# Patient Record
Sex: Female | Born: 1950 | Race: White | Hispanic: No | Smoking: Former smoker
Health system: Southern US, Community
[De-identification: ages and names within clinical notes are randomized; demographics above are authoritative.]

## PROBLEM LIST (undated history)

## (undated) DIAGNOSIS — F419 Anxiety disorder, unspecified: Secondary | ICD-10-CM

## (undated) DIAGNOSIS — I509 Heart failure, unspecified: Secondary | ICD-10-CM

## (undated) DIAGNOSIS — F32A Depression, unspecified: Secondary | ICD-10-CM

## (undated) DIAGNOSIS — J449 Chronic obstructive pulmonary disease, unspecified: Secondary | ICD-10-CM

## (undated) DIAGNOSIS — F329 Major depressive disorder, single episode, unspecified: Secondary | ICD-10-CM

## (undated) DIAGNOSIS — J45909 Unspecified asthma, uncomplicated: Secondary | ICD-10-CM

## (undated) HISTORY — DX: Depression, unspecified: F32.A

## (undated) HISTORY — DX: Major depressive disorder, single episode, unspecified: F32.9

## (undated) HISTORY — DX: Anxiety disorder, unspecified: F41.9

## (undated) HISTORY — PX: ABDOMINAL HYSTERECTOMY: SHX81

## (undated) HISTORY — DX: Chronic obstructive pulmonary disease, unspecified: J44.9

---

## 2000-10-02 ENCOUNTER — Encounter: Payer: Self-pay | Admitting: Emergency Medicine

## 2000-10-02 ENCOUNTER — Emergency Department (HOSPITAL_COMMUNITY): Admission: EM | Admit: 2000-10-02 | Discharge: 2000-10-02 | Payer: Self-pay

## 2001-04-25 ENCOUNTER — Emergency Department (HOSPITAL_COMMUNITY): Admission: EM | Admit: 2001-04-25 | Discharge: 2001-04-25 | Payer: Self-pay | Admitting: Emergency Medicine

## 2001-08-19 ENCOUNTER — Emergency Department (HOSPITAL_COMMUNITY): Admission: EM | Admit: 2001-08-19 | Discharge: 2001-08-19 | Payer: Self-pay

## 2001-12-03 ENCOUNTER — Other Ambulatory Visit: Admission: RE | Admit: 2001-12-03 | Discharge: 2001-12-03 | Payer: Self-pay | Admitting: *Deleted

## 2001-12-16 ENCOUNTER — Encounter: Admission: RE | Admit: 2001-12-16 | Discharge: 2001-12-16 | Payer: Self-pay | Admitting: *Deleted

## 2001-12-16 ENCOUNTER — Encounter: Payer: Self-pay | Admitting: *Deleted

## 2005-05-25 ENCOUNTER — Emergency Department (HOSPITAL_COMMUNITY): Admission: EM | Admit: 2005-05-25 | Discharge: 2005-05-25 | Payer: Self-pay | Admitting: Emergency Medicine

## 2006-03-13 ENCOUNTER — Emergency Department (HOSPITAL_COMMUNITY): Admission: EM | Admit: 2006-03-13 | Discharge: 2006-03-13 | Payer: Self-pay | Admitting: Emergency Medicine

## 2007-02-05 ENCOUNTER — Encounter: Admission: RE | Admit: 2007-02-05 | Discharge: 2007-02-05 | Payer: Self-pay | Admitting: Family Medicine

## 2010-05-02 ENCOUNTER — Other Ambulatory Visit: Payer: Self-pay | Admitting: Gastroenterology

## 2010-05-02 DIAGNOSIS — R110 Nausea: Secondary | ICD-10-CM

## 2010-05-27 ENCOUNTER — Ambulatory Visit
Admission: RE | Admit: 2010-05-27 | Discharge: 2010-05-27 | Disposition: A | Discharge status: Home / Self Care | Payer: BC Managed Care – PPO | Source: Ambulatory Visit | Attending: Gastroenterology | Admitting: Gastroenterology

## 2010-05-27 DIAGNOSIS — R110 Nausea: Secondary | ICD-10-CM

## 2011-06-23 ENCOUNTER — Encounter (HOSPITAL_COMMUNITY): Payer: Self-pay | Admitting: *Deleted

## 2011-06-23 ENCOUNTER — Emergency Department (HOSPITAL_COMMUNITY)
Admission: EM | Admit: 2011-06-23 | Discharge: 2011-06-23 | Disposition: A | Discharge status: Home / Self Care | Payer: Worker's Compensation | Attending: Emergency Medicine | Admitting: Emergency Medicine

## 2011-06-23 DIAGNOSIS — S61209A Unspecified open wound of unspecified finger without damage to nail, initial encounter: Secondary | ICD-10-CM | POA: Insufficient documentation

## 2011-06-23 DIAGNOSIS — Z79899 Other long term (current) drug therapy: Secondary | ICD-10-CM | POA: Insufficient documentation

## 2011-06-23 DIAGNOSIS — Z23 Encounter for immunization: Secondary | ICD-10-CM | POA: Insufficient documentation

## 2011-06-23 DIAGNOSIS — W268XXA Contact with other sharp object(s), not elsewhere classified, initial encounter: Secondary | ICD-10-CM | POA: Insufficient documentation

## 2011-06-23 DIAGNOSIS — Y9229 Other specified public building as the place of occurrence of the external cause: Secondary | ICD-10-CM | POA: Insufficient documentation

## 2011-06-23 DIAGNOSIS — S61019A Laceration without foreign body of unspecified thumb without damage to nail, initial encounter: Secondary | ICD-10-CM

## 2011-06-23 DIAGNOSIS — F17200 Nicotine dependence, unspecified, uncomplicated: Secondary | ICD-10-CM | POA: Insufficient documentation

## 2011-06-23 DIAGNOSIS — Z882 Allergy status to sulfonamides status: Secondary | ICD-10-CM | POA: Insufficient documentation

## 2011-06-23 NOTE — ED Provider Notes (Signed)
History     CSN: [id number]  Arrival date & time 06/23/11  4098   First MD Initiated Contact with Patient 06/23/11 [phone]      Chief Complaint  Patient presents with  . Laceration    (Consider location/radiation/quality/duration/timing/severity/associated sxs/prior treatment) Patient is a 61 y.o. female presenting with skin laceration. The history is provided by the patient.  Laceration  The incident occurred 1 to 2 hours ago.   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 She states it was a clean blade. Tetanus is up-to-date. No other injury. No numbness weakness. She states it bled a lot.  History reviewed. No pertinent past medical history.  History reviewed. No pertinent past surgical history.  No family history on file.  History  Substance Use Topics  . Smoking status: Current Everyday Smoker -- 0.7 packs/day  . Smokeless tobacco: Not on file  . Alcohol Use: No    OB History    Grav Para Term Preterm Abortions TAB SAB Ect Mult Living                  Review of Systems  Skin: Positive for wound.  Neurological: Negative for weakness and numbness.    Allergies  Sulfa antibiotics  Home Medications   Current Outpatient Rx  Name Route Sig Dispense Refill  . AMPHETAMINE-DEXTROAMPHETAMINE 20 MG PO TABS Oral Take 20 mg by mouth 2 (two) times daily.    . QUETIAPINE FUMARATE 25 MG PO TABS Oral Take 50 mg by mouth at bedtime.    Marland Kitchen RISPERIDONE 4 MG PO TABS Oral Take 4 mg by mouth at bedtime.    . VENLAFAXINE HCL ER 150 MG PO CP24 Oral Take 150 mg by mouth daily.      BP 129/92  Pulse 93  Temp 98.4 F (36.9 C)  Resp 20  SpO2 97%  Physical Exam  Musculoskeletal:       1 cm laceration to medial aspect of distal phalanx of right thumb no active bleeding sensation intact distally.  Skin:       1 cm laceration to medial aspect of distal phalanx of right thumb    ED Course  Procedures (including critical care time)  Labs Reviewed - No data  to display No results found.   1. Laceration of thumb     LACERATION REPAIR Performed by: Billee Cashing. Authorized by: Billee Cashing Consent: Verbal consent obtained. Risks  and benefi andts: risks, benefits and alternatives were discussed Consent given by: patient Patient identity confirmed: provided demographic data Prepped and Draped in normal sterile fashion Wound explored  Laceration Location: right thumb  Laceration Length:1cm  No Foreign Bodies seen or palpated  Anesthesia:none  Irrigation method: syringe Amount of cleaning: standard  Skin closure:dermabond  Technique: dermabond applied over wound   Patient tolerance: Patient tolerated the procedure well with no immediate complications.  MDM  Incision to the distal thumb. Dermabond was applied. Patient will be discharged home. Tetanus is started today.        Juliet Rude. Rubin Payor, MD 06/23/11 [phone]

## 2011-06-23 NOTE — ED Notes (Signed)
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

## 2011-06-23 NOTE — Discharge Instructions (Signed)

## 2012-04-02 ENCOUNTER — Ambulatory Visit (INDEPENDENT_AMBULATORY_CARE_PROVIDER_SITE_OTHER): Payer: 59 | Admitting: Physician Assistant

## 2012-04-02 VITALS — BP 116/84 | HR 96 | Temp 98.7°F | Resp 16 | Ht 62.0 in | Wt 182.0 lb

## 2012-04-02 DIAGNOSIS — R350 Frequency of micturition: Secondary | ICD-10-CM

## 2012-04-02 DIAGNOSIS — R109 Unspecified abdominal pain: Secondary | ICD-10-CM

## 2012-04-02 LAB — POCT URINALYSIS DIPSTICK
Ketones, UA: NEGATIVE
Nitrite, UA: NEGATIVE
Protein, UA: NEGATIVE

## 2012-04-02 LAB — POCT UA - MICROSCOPIC ONLY: Crystals, Ur, HPF, POC: NEGATIVE

## 2012-04-02 MED ORDER — NITROFURANTOIN MONOHYD MACRO 100 MG PO CAPS: 100.0000 mg | ORAL_CAPSULE | Freq: Two times a day (BID) | ORAL | Status: DC

## 2012-04-02 NOTE — Progress Notes (Signed)
Subjective:    Patient ID: Julia Daniels, female    DOB: 1950-01-23, 62 y.o.   MRN: [id number]  HPI   Julia Daniels is a pleasant 62 yr old female here with concern for UTI.  Noticed a strong odor to her urine two days ago.  Also noted some left flank pain.  Had difficulty get comfortable.  Has had some nausea but no vomiting.  No fever or chills.  Does have some lower abd cramping - feels like menstrual cramps, but pt had hysterectomy at age 75.  Endorses urinary frequency but no dysuria.  No hematuria.  No history of stones.  Pt does not drink very much water.  Primarily drinks sweet tea and mountain dew.  Flank pain seems to be improving today but she continues with urinary frequency.  She has not noted an abnormal vaginal discharge.  She is not sexually active and has no concern for STIs.     Review of Systems  Constitutional: Negative for fever and chills.  HENT: Negative.   Respiratory: Negative.   Cardiovascular: Negative.   Gastrointestinal: Positive for abdominal pain (lower) and diarrhea. Negative for vomiting, constipation and blood in stool.  Genitourinary: Positive for frequency and flank pain. Negative for dysuria, hematuria, vaginal bleeding, vaginal discharge and pelvic pain.  Neurological: Negative.        Objective:   Physical Exam  Vitals reviewed. Constitutional: She is oriented to person, place, and time. She appears well-developed and well-nourished. No distress.  HENT:  Head: Normocephalic and atraumatic.  Eyes: Conjunctivae are normal. No scleral icterus.  Cardiovascular: Normal rate, regular rhythm and normal heart sounds.   Pulmonary/Chest: Effort normal. She has wheezes (coarse throughout). She has no rales.  Abdominal: Soft. Bowel sounds are normal. She exhibits no distension and no mass. There is no tenderness. There is CVA tenderness (left). There is no rebound and no guarding.  Neurological: She is alert and oriented to person, place, and time.   Skin: Skin is warm and dry.  Psychiatric: She has a normal mood and affect. Her behavior is normal.     Filed Vitals:   04/02/12 1113  BP: 116/84  Pulse: 96  Temp: 98.7 F (37.1 C)  Resp: 16     Results for orders placed in visit on 04/02/12  POCT UA - MICROSCOPIC ONLY      Result Value Range   WBC, Ur, HPF, POC 0-3     RBC, urine, microscopic 0-1     Bacteria, U Microscopic trace     Mucus, UA neg     Epithelial cells, urine per micros 0-1     Crystals, Ur, HPF, POC neg     Casts, Ur, LPF, POC neg     Yeast, UA neg    POCT URINALYSIS DIPSTICK      Result Value Range   Color, UA yellow     Clarity, UA clear     Glucose, UA neg     Bilirubin, UA neg     Ketones, UA neg     Spec Grav, UA 1.015     Blood, UA trace-intact     pH, UA 5.5     Protein, UA neg     Urobilinogen, UA 0.2     Nitrite, UA neg     Leukocytes, UA Trace          Assessment & Plan:  Urinary frequency - Plan: POCT UA - Microscopic Only, POCT urinalysis dipstick, Urine culture, nitrofurantoin, macrocrystal-monohydrate, (  MACROBID) 100 MG capsule  Flank pain - Plan: Urine culture, nitrofurantoin, macrocrystal-monohydrate, (MACROBID) 100 MG capsule  Julia Daniels is a pleasant 62 yr old female with urinary frequency and flank pain.  UA is not very impressive for infection with only trace leukocytes and neg nitrite.  Urine micro is also unremarkable.  No casts or crystals.  There is some left CVA tenderness on exam.  This certainly could represent UTI.  I think it is reasonable to start antibiotics.  Will avoid Cipro due to increased risk for QT prolongation in combination with seroquel.  Will start Macrobid today.  Symptoms are also suggestive of renal calculi given colicky nature of flank pain.  Pt has no history of this and does not feel like she has passed anything.  If cx shows no growth, will stop abx and look for other causes of symptoms.  Did discuss common bladder irritants with pt.  If symptoms  worsen or do not improve, pt will RTC.

## 2012-04-02 NOTE — Patient Instructions (Addendum)
Begin taking the antibiotic as directed.  Drink plenty of WATER!  Avoid sweet tea and sodas.  I will let you know when your culture comes back, if this does not grow anything, we will need to look for other causes of your symptoms.  If anything worsens or is not improving, please let us know.   Urinary Tract Infection Urinary tract infections (UTIs) can develop anywhere along your urinary tract. Your urinary tract is your body's drainage system for removing wastes and extra water. Your urinary tract includes two kidneys, two ureters, a bladder, and a urethra. Your kidneys are a pair of bean-shaped organs. Each kidney is about the size of your fist. They are located below your ribs, one on each side of your spine. CAUSES Infections are caused by microbes, which are microscopic organisms, including fungi, viruses, and bacteria. These organisms are so small that they can only be seen through a microscope. Bacteria are the microbes that most commonly cause UTIs. SYMPTOMS  Symptoms of UTIs may vary by age and gender of the patient and by the location of the infection. Symptoms in young women typically include a frequent and intense urge to urinate and a painful, burning feeling in the bladder or urethra during urination. Older women and men are more likely to be tired, shaky, and weak and have muscle aches and abdominal pain. A fever may mean the infection is in your kidneys. Other symptoms of a kidney infection include pain in your back or sides below the ribs, nausea, and vomiting. DIAGNOSIS To diagnose a UTI, your caregiver will ask you about your symptoms. Your caregiver also will ask to provide a urine sample. The urine sample will be tested for bacteria and white blood cells. White blood cells are made by your body to help fight infection. TREATMENT  Typically, UTIs can be treated with medication. Because most UTIs are caused by a bacterial infection, they usually can be treated with the use of  antibiotics. The choice of antibiotic and length of treatment depend on your symptoms and the type of bacteria causing your infection. HOME CARE INSTRUCTIONS  If you were prescribed antibiotics, take them exactly as your caregiver instructs you. Finish the medication even if you feel better after you have only taken some of the medication.  Drink enough water and fluids to keep your urine clear or pale yellow.  Avoid caffeine, tea, and carbonated beverages. They tend to irritate your bladder.  Empty your bladder often. Avoid holding urine for long periods of time.  Empty your bladder before and after sexual intercourse.  After a bowel movement, women should cleanse from front to back. Use each tissue only once. SEEK MEDICAL CARE IF:   You have back pain.  You develop a fever.  Your symptoms do not begin to resolve within 3 days. SEEK IMMEDIATE MEDICAL CARE IF:   You have severe back pain or lower abdominal pain.  You develop chills.  You have nausea or vomiting.  You have continued burning or discomfort with urination. MAKE SURE YOU:   Understand these instructions.  Will watch your condition.  Will get help right away if you are not doing well or get worse. Document Released: 10/05/2004 Document Revised: 06/27/2011 Document Reviewed: 02/03/2011 [facility] Patient Information 2013 [place], [place].

## 2012-04-03 LAB — URINE CULTURE: Colony Count: NO GROWTH

## 2012-04-12 ENCOUNTER — Encounter: Payer: Self-pay | Admitting: Radiology

## 2012-08-30 ENCOUNTER — Encounter: Payer: Self-pay | Admitting: Family

## 2012-08-30 ENCOUNTER — Ambulatory Visit (INDEPENDENT_AMBULATORY_CARE_PROVIDER_SITE_OTHER)
Admission: RE | Admit: 2012-08-30 | Discharge: 2012-08-30 | Disposition: A | Discharge status: Home / Self Care | Payer: 59 | Source: Ambulatory Visit | Attending: Family | Admitting: Family

## 2012-08-30 ENCOUNTER — Ambulatory Visit (INDEPENDENT_AMBULATORY_CARE_PROVIDER_SITE_OTHER): Payer: 59 | Admitting: Family

## 2012-08-30 VITALS — BP 128/88 | HR 90 | Ht 63.0 in | Wt 193.0 lb

## 2012-08-30 DIAGNOSIS — J449 Chronic obstructive pulmonary disease, unspecified: Secondary | ICD-10-CM

## 2012-08-30 DIAGNOSIS — F988 Other specified behavioral and emotional disorders with onset usually occurring in childhood and adolescence: Secondary | ICD-10-CM

## 2012-08-30 DIAGNOSIS — J4489 Other specified chronic obstructive pulmonary disease: Secondary | ICD-10-CM

## 2012-08-30 DIAGNOSIS — F17200 Nicotine dependence, unspecified, uncomplicated: Secondary | ICD-10-CM

## 2012-08-30 DIAGNOSIS — Z8719 Personal history of other diseases of the digestive system: Secondary | ICD-10-CM

## 2012-08-30 DIAGNOSIS — F319 Bipolar disorder, unspecified: Secondary | ICD-10-CM

## 2012-08-30 DIAGNOSIS — R1011 Right upper quadrant pain: Secondary | ICD-10-CM

## 2012-08-30 DIAGNOSIS — Z720 Tobacco use: Secondary | ICD-10-CM

## 2012-08-30 LAB — CBC WITH DIFFERENTIAL/PLATELET
Basophils Relative: 0.5 % (ref 0.0–3.0)
Eosinophils Absolute: 0.5 10*3/uL (ref 0.0–0.7)
Eosinophils Relative: 4 % (ref 0.0–5.0)
HCT: 42.2 % (ref 36.0–46.0)
Hemoglobin: 14.1 g/dL (ref 12.0–15.0)
Lymphs Abs: 4.2 10*3/uL — ABNORMAL HIGH (ref 0.7–4.0)
MCHC: 33.4 g/dL (ref 30.0–36.0)
MCV: 99.1 fl (ref 78.0–100.0)
Monocytes Absolute: 0.9 10*3/uL (ref 0.1–1.0)
Neutro Abs: 5.8 10*3/uL (ref 1.4–7.7)
RBC: 4.26 Mil/uL (ref 3.87–5.11)

## 2012-08-30 LAB — COMPREHENSIVE METABOLIC PANEL
AST: 18 U/L (ref 0–37)
Albumin: 4.2 g/dL (ref 3.5–5.2)
Alkaline Phosphatase: 69 U/L (ref 39–117)
BUN: 7 mg/dL (ref 6–23)
Creatinine, Ser: 0.9 mg/dL (ref 0.4–1.2)
Potassium: 4.9 mEq/L (ref 3.5–5.1)
Total Bilirubin: 0.4 mg/dL (ref 0.3–1.2)

## 2012-08-30 NOTE — Progress Notes (Signed)
Subjective:    Patient ID: Julia Daniels, female    DOB: 06-Feb-1950, 62 y.o.   MRN: [id number]  HPI And 62 year old white female, new patient to the practice and to be established. She has a history of bipolar disorder, attention deficit disorder, GERD, spastic colon, and tobacco abuse. She has concerns today of right upper quadrant pain that initially occurred 3 weeks ago after eating [place] food. The pain was a 10 out of 10 it radiated to her right back. She describes it as a dull ache. Since that time, she has had similar episodes that were not nearly as intense. She denies any nausea or vomiting. No bloating, no indigestion. Reports increased gas that is normal for her. She's had an endoscopy and colonoscopy per GI this year that was normal. She is a smoker for pack of cigarettes per day since age 89.   Review of Systems  Constitutional: Negative.   HENT: Negative.   Eyes: Negative.   Respiratory: Negative.   Cardiovascular: Negative.   Gastrointestinal: Negative.   Endocrine: Negative.   Genitourinary: Negative.   Musculoskeletal: Negative.   Skin: Negative.   Allergic/Immunologic: Negative.   Neurological: Negative.   Hematological: Negative.   Psychiatric/Behavioral: Negative.    Past Medical History  Diagnosis Date  . Anxiety   . COPD (chronic obstructive pulmonary disease)   . Depression     History   Social History  . Marital Status: Divorced    Spouse Name: N/A    Number of Children: N/A  . Years of Education: N/A   Occupational History  . Not on file.   Social History Main Topics  . Smoking status: Current Every Day Smoker -- 0.75 packs/day  . Smokeless tobacco: Not on file  . Alcohol Use: No  . Drug Use: No  . Sexual Activity: Not Currently   Other Topics Concern  . Not on file   Social History Narrative  . No narrative on file    History reviewed. No pertinent past surgical history.  No family history on file.  Allergies  Allergen  Reactions  . Sulfa Antibiotics     Current Outpatient Prescriptions on File Prior to Visit  Medication Sig Dispense Refill  . amphetamine-dextroamphetamine (ADDERALL) 20 MG tablet Take 20 mg by mouth 2 (two) times daily.      Marland Kitchen lamoTRIgine (LAMICTAL) 200 MG tablet Take 200 mg by mouth daily.      . QUEtiapine (SEROQUEL) 25 MG tablet Take 50 mg by mouth at bedtime.      . risperidone (RISPERDAL) 4 MG tablet Take 4 mg by mouth at bedtime.      Marland Kitchen venlafaxine XR (EFFEXOR-XR) 150 MG 24 hr capsule Take 150 mg by mouth daily.      . nitrofurantoin, macrocrystal-monohydrate, (MACROBID) 100 MG capsule Take 1 capsule (100 mg total) by mouth 2 (two) times daily.  10 capsule  0   No current facility-administered medications on file prior to visit.    BP 128/88  Pulse 90  Ht 5\' 3"  (1.6 m)  Wt 193 lb (87.544 kg)  BMI 34.2 kg/m2  SpO2 96%chart    Objective:   Physical Exam  Constitutional: She is oriented to person, place, and time. She appears well-developed and well-nourished.  HENT:  Head: Normocephalic.  Right Ear: External ear normal.  Left Ear: External ear normal.  Nose: Nose normal.  Mouth/Throat: Oropharynx is clear and moist.  Eyes: Conjunctivae and EOM are normal. Pupils are equal, round,  and reactive to light.  Neck: Normal range of motion. Neck supple.  Cardiovascular: Normal rate, regular rhythm and normal heart sounds.   Pulmonary/Chest: Effort normal and breath sounds normal.  Abdominal: Soft. Bowel sounds are normal. She exhibits no distension. There is no tenderness. There is no rebound.  Musculoskeletal: Normal range of motion.  Neurological: She is alert and oriented to person, place, and time. She has normal reflexes. She displays normal reflexes. No cranial nerve deficit. Coordination normal.  Skin: Skin is warm and dry.  Psychiatric: She has a normal mood and affect.          Assessment & Plan:  Assessment: 1. Right upper quadrant pain 2. GERD 3. Attention  deficit disorder 4. History of hiatal hernia 5. History of spastic colon  6. Bipolar disorder  Plan: Labs today to include H. pylori, CMP, and CBC will notify patient of results. If labs are normal, consider right upper quadrant ultrasound to rule out gallbladder disorder. Continue current medications. Continue seeing psychiatry for psychiatric management. Return for complete physical exam.

## 2012-09-23 ENCOUNTER — Other Ambulatory Visit (HOSPITAL_COMMUNITY)
Admission: RE | Admit: 2012-09-23 | Discharge: 2012-09-23 | Disposition: A | Discharge status: Home / Self Care | Payer: 59 | Source: Ambulatory Visit | Attending: Family | Admitting: Family

## 2012-09-23 ENCOUNTER — Ambulatory Visit (INDEPENDENT_AMBULATORY_CARE_PROVIDER_SITE_OTHER): Payer: 59 | Admitting: Family

## 2012-09-23 ENCOUNTER — Encounter: Payer: Self-pay | Admitting: Family

## 2012-09-23 VITALS — BP 128/82 | HR 107 | Temp 98.8°F | Ht 63.0 in | Wt 190.0 lb

## 2012-09-23 DIAGNOSIS — E669 Obesity, unspecified: Secondary | ICD-10-CM

## 2012-09-23 DIAGNOSIS — Z01419 Encounter for gynecological examination (general) (routine) without abnormal findings: Secondary | ICD-10-CM | POA: Insufficient documentation

## 2012-09-23 DIAGNOSIS — Z124 Encounter for screening for malignant neoplasm of cervix: Secondary | ICD-10-CM

## 2012-09-23 DIAGNOSIS — Z0000 Encounter for general adult medical examination without abnormal findings: Secondary | ICD-10-CM

## 2012-09-23 DIAGNOSIS — F988 Other specified behavioral and emotional disorders with onset usually occurring in childhood and adolescence: Secondary | ICD-10-CM

## 2012-09-23 DIAGNOSIS — Z23 Encounter for immunization: Secondary | ICD-10-CM

## 2012-09-23 DIAGNOSIS — J449 Chronic obstructive pulmonary disease, unspecified: Secondary | ICD-10-CM

## 2012-09-23 LAB — POCT URINALYSIS DIPSTICK
Bilirubin, UA: NEGATIVE
Blood, UA: NEGATIVE
Glucose, UA: NEGATIVE
Ketones, UA: NEGATIVE
Leukocytes, UA: NEGATIVE
Nitrite, UA: NEGATIVE
Protein, UA: NEGATIVE
Spec Grav, UA: 1.02
Urobilinogen, UA: 0.2
pH, UA: 5.5

## 2012-09-23 LAB — LIPID PANEL
Total CHOL/HDL Ratio: 6
VLDL: 46.2 mg/dL — ABNORMAL HIGH (ref 0.0–40.0)

## 2012-09-23 LAB — LDL CHOLESTEROL, DIRECT: Direct LDL: 216.9 mg/dL

## 2012-09-23 LAB — TSH: TSH: 0.73 u[IU]/mL (ref 0.35–5.50)

## 2012-09-23 NOTE — Patient Instructions (Addendum)

## 2012-09-23 NOTE — Progress Notes (Signed)
Subjective:    Patient ID: Julia Daniels, female    DOB: 10-27-1950, 62 y.o.   MRN: [id number]  HPI  62 year old white female, smoker, and for routine physical examination for this healthy  Female. Reviewed all health maintenance protocols including mammography colonoscopy bone density and reviewed appropriate screening labs. Her immunization history was reviewed as well as her current medications and allergies refills of her chronic medications were given and the plan for yearly health maintenance was discussed all orders and referrals were made as appropriate.   Review of Systems  Constitutional: Negative.   HENT: Negative.   Eyes: Negative.   Respiratory: Negative.   Cardiovascular: Negative.   Gastrointestinal: Negative.   Endocrine: Negative.   Genitourinary: Negative.   Musculoskeletal: Negative.   Skin: Negative.   Allergic/Immunologic: Negative.   Neurological: Negative.   Hematological: Negative.   Psychiatric/Behavioral: Negative.    Past Medical History  Diagnosis Date  . Anxiety   . COPD (chronic obstructive pulmonary disease)   . Depression     History   Social History  . Marital Status: Divorced    Spouse Name: N/A    Number of Children: N/A  . Years of Education: N/A   Occupational History  . Not on file.   Social History Main Topics  . Smoking status: Current Every Day Smoker -- 0.75 packs/day  . Smokeless tobacco: Not on file  . Alcohol Use: No  . Drug Use: No  . Sexual Activity: Not Currently   Other Topics Concern  . Not on file   Social History Narrative  . No narrative on file    No past surgical history on file.  No family history on file.  Allergies  Allergen Reactions  . Sulfa Antibiotics     Current Outpatient Prescriptions on File Prior to Visit  Medication Sig Dispense Refill  . amphetamine-dextroamphetamine (ADDERALL) 20 MG tablet Take 20 mg by mouth 2 (two) times daily.      Marland Kitchen lamoTRIgine (LAMICTAL) 200 MG tablet  Take 200 mg by mouth daily.      . QUEtiapine (SEROQUEL) 25 MG tablet Take 50 mg by mouth at bedtime.      . risperidone (RISPERDAL) 4 MG tablet Take 4 mg by mouth at bedtime.      Marland Kitchen venlafaxine XR (EFFEXOR-XR) 150 MG 24 hr capsule Take 150 mg by mouth daily.       No current facility-administered medications on file prior to visit.    BP 128/82  Pulse 107  Temp(Src) 98.8 F (37.1 C) (Oral)  Ht 5\' 3"  (1.6 m)  Wt 190 lb (86.183 kg)  BMI 33.67 kg/m2  SpO2 97%chart    Objective:   Physical Exam  Constitutional: She is oriented to person, place, and time. She appears well-developed and well-nourished.  HENT:  Head: Normocephalic and atraumatic.  Right Ear: External ear normal.  Left Ear: External ear normal.  Nose: Nose normal.  Mouth/Throat: Oropharynx is clear and moist.  Eyes: Conjunctivae and EOM are normal. Pupils are equal, round, and reactive to light.  Neck: Normal range of motion. Neck supple. No thyromegaly present.  Cardiovascular: Normal rate, regular rhythm and normal heart sounds.   Pulmonary/Chest: Effort normal and breath sounds normal.  Coarse breath sounds noted throughout  Abdominal: Soft. Bowel sounds are normal. She exhibits no distension. There is no tenderness. There is no rebound.  Genitourinary: Vagina normal. Guaiac negative stool. No vaginal discharge found.  Musculoskeletal: Normal range of motion.  Neurological: She  is alert and oriented to person, place, and time. She has normal reflexes. She displays normal reflexes. No cranial nerve deficit. Coordination normal.  Skin: Skin is warm and dry. No erythema.  Psychiatric: She has a normal mood and affect.          Assessment & Plan:  Assessment: 1. Complete physical exam 2. Bipolar disorder 3. COPD 4. Tobacco abuse  Plan: Lipids, TSH, and UA sent. Will notify patient results. Encouraged healthy diet, exercise, monthly self breast exams. Followup in 6 months, pending labs, and sooner as  needed.

## 2012-11-12 ENCOUNTER — Encounter: Payer: Self-pay | Admitting: Family

## 2012-11-12 ENCOUNTER — Ambulatory Visit (INDEPENDENT_AMBULATORY_CARE_PROVIDER_SITE_OTHER): Payer: 59 | Admitting: Family

## 2012-11-12 VITALS — HR 95 | Temp 99.0°F | Wt 195.0 lb

## 2012-11-12 DIAGNOSIS — J441 Chronic obstructive pulmonary disease with (acute) exacerbation: Secondary | ICD-10-CM

## 2012-11-12 DIAGNOSIS — R05 Cough: Secondary | ICD-10-CM

## 2012-11-12 DIAGNOSIS — R062 Wheezing: Secondary | ICD-10-CM

## 2012-11-12 MED ORDER — PREDNISONE 20 MG PO TABS: ORAL_TABLET | ORAL | Status: AC

## 2012-11-12 MED ORDER — DOXYCYCLINE HYCLATE 100 MG PO TABS: 100.0000 mg | ORAL_TABLET | Freq: Two times a day (BID) | ORAL | Status: DC

## 2012-11-12 NOTE — Patient Instructions (Signed)
Smoking Cessation Quitting smoking is important to your health and has many advantages. However, it is not always easy to quit since nicotine is a very addictive drug. Often times, people try 3 times or more before being able to quit. This document explains the best ways for you to prepare to quit smoking. Quitting takes hard work and a lot of effort, but you can do it. ADVANTAGES OF QUITTING SMOKING  You will live longer, feel better, and live better.  Your body will feel the impact of quitting smoking almost immediately.  Within 20 minutes, blood pressure decreases. Your pulse returns to its normal level.  After 8 hours, carbon monoxide levels in the blood return to normal. Your oxygen level increases.  After 24 hours, the chance of having a heart attack starts to decrease. Your breath, hair, and body stop smelling like smoke.  After 48 hours, damaged nerve endings begin to recover. Your sense of taste and smell improve.  After 72 hours, the body is virtually free of nicotine. Your bronchial tubes relax and breathing becomes easier.  After 2 to 12 weeks, lungs can hold more air. Exercise becomes easier and circulation improves.  The risk of having a heart attack, stroke, cancer, or lung disease is greatly reduced.  After 1 year, the risk of coronary heart disease is cut in half.  After 5 years, the risk of stroke falls to the same as a nonsmoker.  After 10 years, the risk of lung cancer is cut in half and the risk of other cancers decreases significantly.  After 15 years, the risk of coronary heart disease drops, usually to the level of a nonsmoker.  If you are pregnant, quitting smoking will improve your chances of having a healthy baby.  The people you live with, especially any children, will be healthier.  You will have extra money to spend on things other than cigarettes. QUESTIONS TO THINK ABOUT BEFORE ATTEMPTING TO QUIT You may want to talk about your answers with your  caregiver.  Why do you want to quit?  If you tried to quit in the past, what helped and what did not?  What will be the most difficult situations for you after you quit? How will you plan to handle them?  Who can help you through the tough times? Your family? Friends? A caregiver?  What pleasures do you get from smoking? What ways can you still get pleasure if you quit? Here are some questions to ask your caregiver:  How can you help me to be successful at quitting?  What medicine do you think would be best for me and how should I take it?  What should I do if I need more help?  What is smoking withdrawal like? How can I get information on withdrawal? GET READY  Set a quit date.  Change your environment by getting rid of all cigarettes, ashtrays, matches, and lighters in your home, car, or work. Do not let people smoke in your home.  Review your past attempts to quit. Think about what worked and what did not. GET SUPPORT AND ENCOURAGEMENT You have a better chance of being successful if you have help. You can get support in many ways.  Tell your family, friends, and co-workers that you are going to quit and need their support. Ask them not to smoke around you.  Get individual, group, or telephone counseling and support. Programs are available at local hospitals and health centers. Call your local health department for   information about programs in your area.  Spiritual beliefs and practices may help some smokers quit.  Download a "quit meter" on your computer to keep track of quit statistics, such as how long you have gone without smoking, cigarettes not smoked, and money saved.  Get a self-help book about quitting smoking and staying off of tobacco. LEARN NEW SKILLS AND BEHAVIORS  Distract yourself from urges to smoke. Talk to someone, go for a walk, or occupy your time with a task.  Change your normal routine. Take a different route to work. Drink tea instead of coffee.  Eat breakfast in a different place.  Reduce your stress. Take a hot bath, exercise, or read a book.  Plan something enjoyable to do every day. Reward yourself for not smoking.  Explore interactive web-based programs that specialize in helping you quit. GET MEDICINE AND USE IT CORRECTLY Medicines can help you stop smoking and decrease the urge to smoke. Combining medicine with the above behavioral methods and support can greatly increase your chances of successfully quitting smoking.  Nicotine replacement therapy helps deliver nicotine to your body without the negative effects and risks of smoking. Nicotine replacement therapy includes nicotine gum, lozenges, inhalers, nasal sprays, and skin patches. Some may be available over-the-counter and others require a prescription.  Antidepressant medicine helps people abstain from smoking, but how this works is unknown. This medicine is available by prescription.  Nicotinic receptor partial agonist medicine simulates the effect of nicotine in your brain. This medicine is available by prescription. Ask your caregiver for advice about which medicines to use and how to use them based on your health history. Your caregiver will tell you what side effects to look out for if you choose to be on a medicine or therapy. Carefully read the information on the package. Do not use any other product containing nicotine while using a nicotine replacement product.  RELAPSE OR DIFFICULT SITUATIONS Most relapses occur within the first 3 months after quitting. Do not be discouraged if you start smoking again. Remember, most people try several times before finally quitting. You may have symptoms of withdrawal because your body is used to nicotine. You may crave cigarettes, be irritable, feel very hungry, cough often, get headaches, or have difficulty concentrating. The withdrawal symptoms are only temporary. They are strongest when you first quit, but they will go away within  10 14 days. To reduce the chances of relapse, try to:  Avoid drinking alcohol. Drinking lowers your chances of successfully quitting.  Reduce the amount of caffeine you consume. Once you quit smoking, the amount of caffeine in your body increases and can give you symptoms, such as a rapid heartbeat, sweating, and anxiety.  Avoid smokers because they can make you want to smoke.  Do not let weight gain distract you. Many smokers will gain weight when they quit, usually less than 10 pounds. Eat a healthy diet and stay active. You can always lose the weight gained after you quit.  Find ways to improve your mood other than smoking. FOR MORE INFORMATION  www.smokefree.gov  Document Released: 12/20/2000 Document Revised: 06/27/2011 Document Reviewed: 04/06/2011 [employer] Patient Information 2014 [employer]  

## 2012-11-12 NOTE — Progress Notes (Signed)
  Subjective:    Patient ID: Julia Daniels, female    DOB: 06-30-1950, 62 y.o.   MRN: [id number]  HPI 62 year old white female, smoker, is in today with c/o cough, congestion, wheezing and shortness of breath x4 days just sitting around the bonfire. She [place] will with no relief. Reports she stopped smoking approximately 4 days ago. Cough is productive of yellow phlegm. She has a history of COPD and tobacco abuse.   Review of Systems  Constitutional: Negative.   HENT: Negative.   Respiratory: Positive for cough, shortness of breath and wheezing.   Cardiovascular: Negative.   Endocrine: Negative.   Genitourinary: Negative.   Musculoskeletal: Negative.   Skin: Negative.   Neurological: Negative.   Psychiatric/Behavioral: Negative.    Past Medical History  Diagnosis Date  . Anxiety   . COPD (chronic obstructive pulmonary disease)   . Depression     History   Social History  . Marital Status: Divorced    Spouse Name: N/A    Number of Children: N/A  . Years of Education: N/A   Occupational History  . Not on file.   Social History Main Topics  . Smoking status: Current Every Day Smoker -- 0.75 packs/day  . Smokeless tobacco: Not on file  . Alcohol Use: No  . Drug Use: No  . Sexual Activity: Not Currently   Other Topics Concern  . Not on file   Social History Narrative  . No narrative on file    History reviewed. No pertinent past surgical history.  No family history on file.  Allergies  Allergen Reactions  . Sulfa Antibiotics     Current Outpatient Prescriptions on File Prior to Visit  Medication Sig Dispense Refill  . amphetamine-dextroamphetamine (ADDERALL) 20 MG tablet Take 20 mg by mouth 2 (two) times daily.      Marland Kitchen lamoTRIgine (LAMICTAL) 200 MG tablet Take 200 mg by mouth daily.      . QUEtiapine (SEROQUEL) 25 MG tablet Take 50 mg by mouth at bedtime.      . risperidone (RISPERDAL) 4 MG tablet Take 4 mg by mouth at bedtime.      Marland Kitchen venlafaxine XR  (EFFEXOR-XR) 150 MG 24 hr capsule Take 150 mg by mouth daily.       No current facility-administered medications on file prior to visit.    Pulse 95  Temp(Src) 99 F (37.2 C) (Oral)  Wt 195 lb (88.451 kg)  SpO2 96%chart    Objective:   Physical Exam  Constitutional: She is oriented to person, place, and time. She appears well-developed and well-nourished.  HENT:  Right Ear: External ear normal.  Left Ear: External ear normal.  Nose: Nose normal.  Mouth/Throat: Oropharynx is clear and moist.  Neck: Normal range of motion. Neck supple.  Cardiovascular: Normal rate, regular rhythm and normal heart sounds.   Pulmonary/Chest: Effort normal. She has wheezes. She has rales.  Coarse breath sounds noted throughout. Diminished to the right base  Neurological: She is alert and oriented to person, place, and time. She displays normal reflexes. No cranial nerve deficit. Coordination normal.  Skin: Skin is warm and dry.  Psychiatric: She has a normal mood and affect.          Assessment & Plan:  Assessment: 1. COPD exacerbation 2. Tobacco abuse 3. Wheezing  Plan: Doxycycline 100 mg twice a day x10 days. Prednisone 60x3, 40x3, 20x3. Call the office today questions or concerns. Recheck as scheduled, and as needed.

## 2012-11-29 ENCOUNTER — Ambulatory Visit (INDEPENDENT_AMBULATORY_CARE_PROVIDER_SITE_OTHER): Payer: 59 | Admitting: Family Medicine

## 2012-11-29 ENCOUNTER — Encounter: Payer: Self-pay | Admitting: Family Medicine

## 2012-11-29 VITALS — BP 124/84 | HR 84 | Temp 98.7°F | Wt 195.0 lb

## 2012-11-29 DIAGNOSIS — J209 Acute bronchitis, unspecified: Secondary | ICD-10-CM

## 2012-11-29 DIAGNOSIS — J441 Chronic obstructive pulmonary disease with (acute) exacerbation: Secondary | ICD-10-CM

## 2012-11-29 MED ORDER — ALBUTEROL SULFATE HFA 108 (90 BASE) MCG/ACT IN AERS: 2.0000 | INHALATION_SPRAY | Freq: Four times a day (QID) | RESPIRATORY_TRACT | Status: DC | PRN

## 2012-11-29 MED ORDER — DOXYCYCLINE HYCLATE 100 MG PO TABS: 100.0000 mg | ORAL_TABLET | Freq: Two times a day (BID) | ORAL | Status: DC

## 2012-11-29 MED ORDER — PREDNISONE 20 MG PO TABS: 20.0000 mg | ORAL_TABLET | Freq: Every day | ORAL | Status: DC

## 2012-11-29 NOTE — Progress Notes (Signed)
Pre visit review using our clinic review tool, if applicable. No additional management support is needed unless otherwise documented below in the visit note. 

## 2012-11-29 NOTE — Progress Notes (Addendum)
Chief Complaint  Patient presents with  . Follow-up    bronchitis    HPI:  -started: 4 days ago -symptoms:nasal congestion, productive cough with increased mucus production, malaise, wheezing, mild SOB -denies:fever, NVD, tooth pain -has tried: musinex -sick contacts/travel/risks: denies flu exposure, tick exposure or or Ebola risks, everyone at work has been sick -Hx of: COPD, smoking,anxiety, seen by PCP several weeks ago for upper resp symptoms and cough and treated with doxy and prednisone - reports felt much better for a while, then got sick again -not smoking, but son smokes, doesn't think has had PFTs  ROS: See pertinent positives and negatives per HPI.  Past Medical History  Diagnosis Date  . Anxiety   . COPD (chronic obstructive pulmonary disease)   . Depression     No past surgical history on file.  No family history on file.  History   Social History  . Marital Status: Divorced    Spouse Name: N/A    Number of Children: N/A  . Years of Education: N/A   Social History Main Topics  . Smoking status: Former Smoker -- 0.75 packs/day    Quit date: 11/08/2012  . Smokeless tobacco: None  . Alcohol Use: No  . Drug Use: No  . Sexual Activity: Not Currently   Other Topics Concern  . None   Social History Narrative  . None    Current outpatient prescriptions:amphetamine-dextroamphetamine (ADDERALL) 20 MG tablet, Take 20 mg by mouth 2 (two) times daily., Disp: , Rfl: ;  lamoTRIgine (LAMICTAL) 200 MG tablet, Take 200 mg by mouth daily., Disp: , Rfl: ;  QUEtiapine (SEROQUEL) 25 MG tablet, Take 50 mg by mouth at bedtime., Disp: , Rfl: ;  risperidone (RISPERDAL) 4 MG tablet, Take 4 mg by mouth at bedtime., Disp: , Rfl:  venlafaxine XR (EFFEXOR-XR) 150 MG 24 hr capsule, Take 150 mg by mouth daily., Disp: , Rfl: ;  albuterol (PROVENTIL HFA;VENTOLIN HFA) 108 (90 BASE) MCG/ACT inhaler, Inhale 2 puffs into the lungs every 6 (six) hours as needed for wheezing or shortness of  breath., Disp: 1 Inhaler, Rfl: 0;  doxycycline (VIBRA-TABS) 100 MG tablet, Take 1 tablet (100 mg total) by mouth 2 (two) times daily., Disp: 20 tablet, Rfl: 0 predniSONE (DELTASONE) 20 MG tablet, Take 1 tablet (20 mg total) by mouth daily with breakfast., Disp: 10 tablet, Rfl: 0  EXAM:  Filed Vitals:   11/29/12 1407  BP: 124/84  Pulse: 84  Temp: 98.7 F (37.1 C)    Body mass index is 34.55 kg/(m^2).  GENERAL: vitals reviewed and listed above, alert, oriented, appears well hydrated and in no acute distress  HEENT: atraumatic, conjunttiva clear, no obvious abnormalities on inspection of external nose and ears, normal appearance of ear canals and TMs, clear nasal congestion, mild post oropharyngeal erythema with PND, no tonsillar edema or exudate, no sinus TTP  NECK: no obvious masses on inspection  LUNGS: few exp wheezes, good air movement, no rhonchi or rales  CV: HRRR, no peripheral edema  MS: moves all extremities without noticeable abnormality  PSYCH: pleasant and cooperative, no obvious depression or anxiety  ASSESSMENT AND PLAN:  Discussed the following assessment and plan:  COPD exacerbation - Plan: predniSONE (DELTASONE) 20 MG tablet, albuterol (PROVENTIL HFA;VENTOLIN HFA) 108 (90 BASE) MCG/ACT inhaler, doxycycline (VIBRA-TABS) 100 MG tablet  Acute bronchitis - Plan: predniSONE (DELTASONE) 20 MG tablet, albuterol (PROVENTIL HFA;VENTOLIN HFA) 108 (90 BASE) MCG/ACT inhaler, doxycycline (VIBRA-TABS) 100 MG tablet  Likely viral given sick contacts  but will tx with prednisone and abx given copd with increased mucus production and some mild SOB. Doxy given on seroquel and safer then azithro. Risks and return precuations discussed. Discussed other potential etiologies which are less likely. Follow up with PCP  - may need to get PFTs once over acute illness and consider chronic daily meds for COPD. Advised to ask son to smoke outside and for her to avoid cigarette  smoke.   Patient Instructions  Chronic Obstructive Pulmonary Disease Chronic obstructive pulmonary disease (COPD) is a condition in which airflow from the lungs is restricted. The lungs can never return to normal, but there are measures you can take which will improve them and make you feel better. CAUSES   Smoking.  Exposure to secondhand smoke.  Breathing in irritants such as air pollution, dust, cigarette smoke, strong odors, aerosol sprays, or paint fumes.  History of lung infections. SYMPTOMS   Deep, persistent (chronic) cough with a large amount of thick mucus.  Wheezing.  Shortness of breath, especially with physical activity.  Feeling like you cannot get enough air.  Difficulty breathing.  Rapid breaths (tachypnea).  Gray or bluish discoloration (cyanosis) of the skin, especially in fingers, toes, or lips.  Fatigue.  Weight loss.  Swelling in legs, ankles, or feet.  Fast heartbeat (tachycardia).  Frequent lung infections.   Chest tightness. DIAGNOSIS  Initial diagnosis may be based on your history, symptoms, and physical examination. Additional tests for COPD may include:  Chest X-ray.  Computed tomography (CT) scan.  Lung (pulmonary) function tests.  Blood tests. TREATMENT  Treatment focuses on making you comfortable (supportive care). Your caregiver may prescribe medicines (inhaled or pills) to help improve your breathing. Additional treatment options may include oxygen therapy and pulmonary rehabilitation. Treatment should also include reducing your exposure to known irritants and following a plan to stop smoking. HOME CARE INSTRUCTIONS   Take all medicines, including antibiotic medicines, as directed by your caregiver.  Use inhaled medicines as directed by your caregiver.  Avoid medicines or cough syrups that dry up your airway (antihistamines) and slow down the elimination of secretions. This decreases respiratory capacity and may lead to  infections.  If you smoke, stop smoking.  Avoid exposure to smoke, chemicals, and fumes that aggravate your breathing.  Avoid contact with individuals that have a contagious illness.  Avoid extreme temperature and humidity changes.  Use humidifiers at home and at your bedside if they do not make breathing difficult.  Drink enough water and fluids to keep your urine clear or pale yellow. This loosens secretions.  Eat healthy foods. Eating smaller, more frequent meals and resting before meals may help you maintain your strength.  Ask your caregiver about the use of vitamins and mineral supplements.  Stay active. Exercise and physical activity will help maintain your ability to do things you want to do.  Balance activity with periods of rest.  Assume a position of comfort if you become short of breath.  Learn and use relaxation techniques.  Learn and use controlled breathing techniques as directed by your caregiver. Controlled breathing techniques include:  Pursed lip breathing. This breathing technique starts with breathing in (inhaling) through your nose for 1 second. Next, purse your lips as if you were going to whistle. Then breathe out (exhale) through the pursed lips for 2 seconds.  Diaphragmatic breathing. Start by putting one hand on your abdomen just above your waist. Inhale slowly through your nose. The hand on your abdomen should move out.  Then exhale slowly through pursed lips. You should be able to feel the hand on your abdomen moving in as you exhale.  Learn and use controlled coughing to clear mucus from your lungs. Controlled coughing is a series of short, progressive coughs. The steps of controlled coughing are: 1. Lean your head slightly forward. 2. Breathe in deeply using diaphragmatic breathing. 3. Try to hold your breath for 3 seconds. 4. Keep your mouth slightly open while coughing twice. 5. Spit any mucus out into a tissue. 6. Rest and repeat the steps once  or twice as needed.  Receive all protective vaccines your caregiver suggests, especially pneumococcal and influenza vaccines.  Learn to manage stress.  Schedule and attend all follow-up appointments as directed by your caregiver. It is important to keep all your appointments.  Participate in pulmonary rehabilitation as directed by your caregiver.  Use home oxygen as suggested. SEEK MEDICAL CARE IF:   You are coughing up more mucus than usual.  There is a change in the color or thickness of the mucus.  Breathing is more labored than usual.  Your breathing is faster than usual.  Your skin color is more cyanotic than usual.  You are running out of the medicine you take for your breathing.  You are anxious, apprehensive, or restless.  You have a fever. SEEK IMMEDIATE MEDICAL CARE IF:   You have a rapid heart rate.  You have shortness of breath while you are resting.  You have shortness of breath that prevents you from being able to talk.  You have shortness of breath that prevents you from performing your usual physical activities.  You have chest pain lasting longer than 5 minutes.  You have a seizure.  Your family or friends notice that you are agitated or confused. MAKE SURE YOU:   Understand these instructions.  Will watch your condition.  Will get help right away if you are not doing well or get worse. Document Released: 10/05/2004 Document Revised: 09/20/2011 Document Reviewed: 08/22/2012 [facility] Patient Information 2014 [place], Lona Kettle, Dahlia Client R.

## 2012-11-29 NOTE — Patient Instructions (Signed)

## 2012-12-17 ENCOUNTER — Ambulatory Visit (INDEPENDENT_AMBULATORY_CARE_PROVIDER_SITE_OTHER): Payer: 59 | Admitting: Family

## 2012-12-17 ENCOUNTER — Encounter: Payer: Self-pay | Admitting: Family

## 2012-12-17 VITALS — BP 128/72 | HR 104 | Wt 201.0 lb

## 2012-12-17 DIAGNOSIS — J309 Allergic rhinitis, unspecified: Secondary | ICD-10-CM

## 2012-12-17 DIAGNOSIS — J441 Chronic obstructive pulmonary disease with (acute) exacerbation: Secondary | ICD-10-CM

## 2012-12-17 MED ORDER — FLUTICASONE PROPIONATE 50 MCG/ACT NA SUSP: 2.0000 | Freq: Every day | NASAL | Status: DC

## 2012-12-17 NOTE — Patient Instructions (Signed)

## 2012-12-17 NOTE — Progress Notes (Signed)
Pre visit review using our clinic review tool, if applicable. No additional management support is needed unless otherwise documented below in the visit note. 

## 2012-12-17 NOTE — Progress Notes (Signed)
Subjective:    Patient ID: Julia Daniels, female    DOB: 17-Nov-1950, 62 y.o.   MRN: [id number]  HPI  62 year old white female, former smoker, recently quit October 31. Then today with complaints of cough, congestion, wheezing x3 days. She was seen on 11/12/2012 COPD exacerbation and treated with prednisone. She was subsequently seen again on 11/29/2012 and was treated with prednisone and doxycycline. Both of which she recovered from. She is present today with similar illness. She's been using her albuterol inhaler as needed, Robitussin and taken ibuprofen. She has a productive cough with white phlegm. Denies any fever, muscle aches or pain.   Review of Systems  Constitutional: Negative.   HENT: Positive for congestion, postnasal drip and sinus pressure.   Respiratory: Positive for cough, shortness of breath and wheezing.   Cardiovascular: Negative.   Gastrointestinal: Negative.   Endocrine: Negative.   Genitourinary: Negative.   Musculoskeletal: Negative.   Skin: Negative.   Allergic/Immunologic: Negative.   Neurological: Negative.   Hematological: Negative.   Psychiatric/Behavioral: Negative.    Past Medical History  Diagnosis Date  . Anxiety   . COPD (chronic obstructive pulmonary disease)   . Depression     History   Social History  . Marital Status: Divorced    Spouse Name: N/A    Number of Children: N/A  . Years of Education: N/A   Occupational History  . Not on file.   Social History Main Topics  . Smoking status: Former Smoker -- 0.75 packs/day    Quit date: 11/08/2012  . Smokeless tobacco: Not on file  . Alcohol Use: No  . Drug Use: No  . Sexual Activity: Not Currently   Other Topics Concern  . Not on file   Social History Narrative  . No narrative on file    History reviewed. No pertinent past surgical history.  No family history on file.  Allergies  Allergen Reactions  . Sulfa Antibiotics     Current Outpatient Prescriptions on File  Prior to Visit  Medication Sig Dispense Refill  . albuterol (PROVENTIL HFA;VENTOLIN HFA) 108 (90 BASE) MCG/ACT inhaler Inhale 2 puffs into the lungs every 6 (six) hours as needed for wheezing or shortness of breath.  1 Inhaler  0  . amphetamine-dextroamphetamine (ADDERALL) 20 MG tablet Take 20 mg by mouth 2 (two) times daily.      Marland Kitchen lamoTRIgine (LAMICTAL) 200 MG tablet Take 200 mg by mouth daily.      . QUEtiapine (SEROQUEL) 25 MG tablet Take 50 mg by mouth at bedtime.      . risperidone (RISPERDAL) 4 MG tablet Take 4 mg by mouth at bedtime.      Marland Kitchen venlafaxine XR (EFFEXOR-XR) 150 MG 24 hr capsule Take 150 mg by mouth daily.       No current facility-administered medications on file prior to visit.    BP 128/72  Pulse 104  Wt 201 lb (91.173 kg)  SpO2 96%chart    Objective:   Physical Exam  Constitutional: She is oriented to person, place, and time. She appears well-developed and well-nourished.  HENT:  Right Ear: External ear normal.  Left Ear: External ear normal.  Nose: Nose normal.  Mouth/Throat: Oropharynx is clear and moist.  Eyes: Conjunctivae and EOM are normal. Pupils are equal, round, and reactive to light.  Neck: Normal range of motion. Neck supple.  Cardiovascular: Normal rate, regular rhythm and normal heart sounds.   Pulmonary/Chest: Effort normal. She has wheezes.  Abdominal: Soft.  Bowel sounds are normal.  Musculoskeletal: Normal range of motion.  Neurological: She is alert and oriented to person, place, and time. She has normal reflexes. She displays normal reflexes. No cranial nerve deficit. Coordination normal.  Skin: Skin is warm and dry.  Psychiatric: She has a normal mood and affect.          Assessment & Plan:  Assessment: 1. COPD exacerbation 2. Cough 3. Wheezing  Plan: Start Symbicort 80/4.5 2 puffs twice a day. Flonase 2 sprays in each nostril once a day. Albuterol as needed. Asthma precautions given. Daily for cigarette smoke. Patient the office  with any questions or concerns. Recheck as scheduled, and as needed.

## 2012-12-20 ENCOUNTER — Telehealth: Payer: Self-pay | Admitting: Family

## 2012-12-20 NOTE — Telephone Encounter (Signed)
Pt saw np on 12-17-12. Pt needs work note from 12-10 thru 12-20-12

## 2012-12-20 NOTE — Telephone Encounter (Signed)
Pt aware and will pick up

## 2012-12-20 NOTE — Telephone Encounter (Signed)
Note printed.

## 2013-02-07 ENCOUNTER — Encounter (HOSPITAL_COMMUNITY): Payer: Self-pay | Admitting: Emergency Medicine

## 2013-02-07 ENCOUNTER — Emergency Department (HOSPITAL_COMMUNITY)
Admission: EM | Admit: 2013-02-07 | Discharge: 2013-02-07 | Disposition: A | Discharge status: Home / Self Care | Payer: Worker's Compensation | Attending: Emergency Medicine | Admitting: Emergency Medicine

## 2013-02-07 DIAGNOSIS — F3289 Other specified depressive episodes: Secondary | ICD-10-CM | POA: Insufficient documentation

## 2013-02-07 DIAGNOSIS — IMO0002 Reserved for concepts with insufficient information to code with codable children: Secondary | ICD-10-CM | POA: Insufficient documentation

## 2013-02-07 DIAGNOSIS — Z23 Encounter for immunization: Secondary | ICD-10-CM | POA: Insufficient documentation

## 2013-02-07 DIAGNOSIS — Y9289 Other specified places as the place of occurrence of the external cause: Secondary | ICD-10-CM | POA: Diagnosis not present

## 2013-02-07 DIAGNOSIS — Z87891 Personal history of nicotine dependence: Secondary | ICD-10-CM | POA: Diagnosis not present

## 2013-02-07 DIAGNOSIS — J4489 Other specified chronic obstructive pulmonary disease: Secondary | ICD-10-CM | POA: Insufficient documentation

## 2013-02-07 DIAGNOSIS — Y9389 Activity, other specified: Secondary | ICD-10-CM | POA: Insufficient documentation

## 2013-02-07 DIAGNOSIS — S61215A Laceration without foreign body of left ring finger without damage to nail, initial encounter: Secondary | ICD-10-CM

## 2013-02-07 DIAGNOSIS — S61209A Unspecified open wound of unspecified finger without damage to nail, initial encounter: Secondary | ICD-10-CM | POA: Insufficient documentation

## 2013-02-07 DIAGNOSIS — J449 Chronic obstructive pulmonary disease, unspecified: Secondary | ICD-10-CM | POA: Diagnosis not present

## 2013-02-07 DIAGNOSIS — Y990 Civilian activity done for income or pay: Secondary | ICD-10-CM | POA: Insufficient documentation

## 2013-02-07 DIAGNOSIS — F329 Major depressive disorder, single episode, unspecified: Secondary | ICD-10-CM | POA: Insufficient documentation

## 2013-02-07 DIAGNOSIS — F411 Generalized anxiety disorder: Secondary | ICD-10-CM | POA: Insufficient documentation

## 2013-02-07 DIAGNOSIS — Z79899 Other long term (current) drug therapy: Secondary | ICD-10-CM | POA: Insufficient documentation

## 2013-02-07 DIAGNOSIS — W268XXA Contact with other sharp object(s), not elsewhere classified, initial encounter: Secondary | ICD-10-CM | POA: Insufficient documentation

## 2013-02-07 MED ORDER — TETANUS-DIPHTH-ACELL PERTUSSIS 5-2.5-18.5 LF-MCG/0.5 IM SUSP
0.5000 mL | Freq: Once | INTRAMUSCULAR | Status: AC
  Administered 2013-02-07: 0.5 mL via INTRAMUSCULAR
  Filled 2013-02-07: qty 0.5

## 2013-02-07 NOTE — ED Notes (Signed)
Pt cut tip of left ring finger on a microtone blade at work. Tip of finger is bleeding slightly at the moment, wrapped in gauze. Pt has small lac to tip of ring finger.

## 2013-02-07 NOTE — ED Provider Notes (Signed)
CSN: [id number]     Arrival date & time 02/07/13  1114 History   First MD Initiated Contact with Patient 02/07/13 1121     Chief Complaint  Patient presents with  . Laceration    ring finger   (Consider location/radiation/quality/duration/timing/severity/associated sxs/prior Treatment) Patient is a 63 y.o. female presenting with skin laceration. The history is provided by the patient and medical records.  Laceration  There is a 63 year old female with past medical history significant for anxiety, depression, COPD, presenting to the ED for laceration. Patient works in a microbiology lab at the [employer] and states she accidentally cut her left ring finger on a machine blade at work. States the blade that cut her was clean, no tissue had been cut with the blade yet.  Bleeding is well controlled on arrival. Date of last tetanus unknown.  Pt needs UDS performed for work as well.  Past Medical History  Diagnosis Date  . Anxiety   . COPD (chronic obstructive pulmonary disease)   . Depression    Past Surgical History  Procedure Laterality Date  . Abdominal hysterectomy      partial    No family history on file. History  Substance Use Topics  . Smoking status: Former Smoker -- 0.75 packs/day    Quit date: 11/08/2012  . Smokeless tobacco: Not on file  . Alcohol Use: No   OB History   Grav Para Term Preterm Abortions TAB SAB Ect Mult Living                 Review of Systems  Skin: Positive for wound.  All other systems reviewed and are negative.    Allergies  Sulfa antibiotics  Home Medications   Current Outpatient Rx  Name  Route  Sig  Dispense  Refill  . amphetamine-dextroamphetamine (ADDERALL) 20 MG tablet   Oral   Take 20 mg by mouth 2 (two) times daily.         . fluticasone (FLONASE) 50 MCG/ACT nasal spray   Each Nare   Place 2 sprays into both nostrils daily.   16 g   6   . ibuprofen (ADVIL,MOTRIN) 200 MG tablet   Oral   Take 400 mg by mouth every 6  (six) hours as needed.         . lamoTRIgine (LAMICTAL) 200 MG tablet   Oral   Take 200 mg by mouth daily.         . QUEtiapine (SEROQUEL) 25 MG tablet   Oral   Take 50 mg by mouth at bedtime.         . risperidone (RISPERDAL) 4 MG tablet   Oral   Take 4 mg by mouth at bedtime.         Marland Kitchen venlafaxine XR (EFFEXOR-XR) 150 MG 24 hr capsule   Oral   Take 150 mg by mouth daily.         Marland Kitchen albuterol (PROVENTIL HFA;VENTOLIN HFA) 108 (90 BASE) MCG/ACT inhaler   Inhalation   Inhale 2 puffs into the lungs every 6 (six) hours as needed for wheezing or shortness of breath.   1 Inhaler   0    BP 132/83  Pulse 72  Temp(Src) 98.3 F (36.8 C) (Oral)  Resp 20  SpO2 95%  Physical Exam  Nursing note and vitals reviewed. Constitutional: She is oriented to person, place, and time. She appears well-developed and well-nourished. No distress.  HENT:  Head: Normocephalic and atraumatic.  Mouth/Throat: Oropharynx is  clear and moist.  Eyes: Conjunctivae and EOM are normal. Pupils are equal, round, and reactive to light.  Neck: Normal range of motion. Neck supple.  Cardiovascular: Normal rate, regular rhythm and normal heart sounds.   Pulmonary/Chest: Effort normal and breath sounds normal. No respiratory distress. She has no wheezes.  Musculoskeletal: Normal range of motion.       Left hand: She exhibits laceration. She exhibits normal range of motion, no tenderness, no bony tenderness, normal capillary refill, no deformity and no swelling. Normal sensation noted. Normal strength noted.  Small avulsion laceration to distal left ring finger; bleeding well controlled; full ROM of finger maintained; sensation intact diffusely throughout digit; no FB or signs of infection present  Neurological: She is alert and oriented to person, place, and time.  Skin: Skin is warm and dry. She is not diaphoretic.  Psychiatric: She has a normal mood and affect.    ED Course  Procedures (including  critical care time) Labs Review Labs Reviewed - No data to display Imaging Review No results found.  EKG Interpretation   None       MDM   1. Laceration of left ring finger w/o foreign body w/o damage to nail    Laceration distal left ring finger without active bleeding-- not requiring formal repair.  Tetanus updated, pressure dressing applied.  UDS performed by phlebotomy.  Pt will FU with her PCP if problems occur.  Discussed plan with pt, she agreed.  Return precautions advised.  Larene Pickett, PA-C 02/07/13 1334

## 2013-02-07 NOTE — Discharge Instructions (Signed)
Keep area bandaged for the next 24 hours.  Advise keeping area clean with soap and warm water-- keep dry and covered while working. Follow up with your primary care physician if problems occur. Return to the ED for new or worsening symptoms.

## 2013-02-09 NOTE — ED Provider Notes (Signed)
Medical screening examination/treatment/procedure(s) were performed by non-physician practitioner and as supervising physician I was immediately available for consultation/collaboration.   Kathalene Frames, MD 02/09/13 [phone]

## 2013-02-17 ENCOUNTER — Telehealth: Payer: Self-pay | Admitting: Family

## 2013-02-17 DIAGNOSIS — J441 Chronic obstructive pulmonary disease with (acute) exacerbation: Secondary | ICD-10-CM

## 2013-02-17 DIAGNOSIS — J209 Acute bronchitis, unspecified: Secondary | ICD-10-CM

## 2013-02-17 MED ORDER — BUDESONIDE-FORMOTEROL FUMARATE 80-4.5 MCG/ACT IN AERO: 2.0000 | INHALATION_SPRAY | Freq: Two times a day (BID) | RESPIRATORY_TRACT | Status: DC

## 2013-02-17 MED ORDER — ALBUTEROL SULFATE HFA 108 (90 BASE) MCG/ACT IN AERS: 2.0000 | INHALATION_SPRAY | Freq: Four times a day (QID) | RESPIRATORY_TRACT | Status: DC | PRN

## 2013-02-17 NOTE — Telephone Encounter (Signed)
Refills sent to pharmacy. 

## 2013-02-17 NOTE — Telephone Encounter (Signed)
Pt needs new rx symbicort. and refill on [place] call into [facility] [address]. Pt had samples of smbicort

## 2013-03-24 ENCOUNTER — Ambulatory Visit: Payer: 59 | Admitting: Family

## 2013-03-24 DIAGNOSIS — Z0289 Encounter for other administrative examinations: Secondary | ICD-10-CM

## 2013-04-24 ENCOUNTER — Ambulatory Visit (INDEPENDENT_AMBULATORY_CARE_PROVIDER_SITE_OTHER): Payer: 59 | Admitting: Physician Assistant

## 2013-04-24 ENCOUNTER — Encounter: Payer: Self-pay | Admitting: Physician Assistant

## 2013-04-24 VITALS — BP 144/88 | HR 104 | Temp 98.4°F | Resp 16 | Ht 63.0 in | Wt 202.0 lb

## 2013-04-24 DIAGNOSIS — J309 Allergic rhinitis, unspecified: Secondary | ICD-10-CM

## 2013-04-24 DIAGNOSIS — J441 Chronic obstructive pulmonary disease with (acute) exacerbation: Secondary | ICD-10-CM

## 2013-04-24 DIAGNOSIS — M25519 Pain in unspecified shoulder: Secondary | ICD-10-CM

## 2013-04-24 MED ORDER — PREDNISONE 20 MG PO TABS: ORAL_TABLET | ORAL | Status: DC

## 2013-04-24 NOTE — Progress Notes (Signed)
Subjective:    Patient ID: Julia Daniels, female    DOB: 1950/05/02, 63 y.o.   MRN: [id number]  Cough This is a recurrent problem. The current episode started in the past 7 days. The problem has been gradually improving. The problem occurs every few minutes. The cough is non-productive. Associated symptoms include ear congestion, headaches, nasal congestion, postnasal drip, rhinorrhea, a sore throat, shortness of breath and wheezing. Pertinent negatives include no chest pain, chills, ear pain, fever, hemoptysis, myalgias, rash or sweats. Treatments tried: Mucinex cold and flu. The treatment provided mild relief. Her past medical history is significant for bronchitis and COPD.  Shoulder Pain  The pain is present in the left shoulder. This is a new problem. The current episode started in the past 7 days. There has been no history of extremity trauma. The problem occurs intermittently. The problem has been gradually improving. The quality of the pain is described as aching. The pain is at a severity of 2/10. The pain is mild. Pertinent negatives include no fever, inability to bear weight, itching, joint locking, joint swelling, limited range of motion, numbness, stiffness or tingling. The symptoms are aggravated by activity and lying down. She has tried nothing for the symptoms. There is no history of diabetes, osteoarthritis or rheumatoid arthritis.      Review of Systems  Constitutional: Negative for fever, chills, activity change, appetite change and unexpected weight change.  HENT: Positive for postnasal drip, rhinorrhea and sore throat. Negative for ear pain.   Respiratory: Positive for cough, chest tightness, shortness of breath and wheezing. Negative for apnea, hemoptysis and choking.   Cardiovascular: Negative for chest pain, palpitations and leg swelling.  Musculoskeletal: Negative for arthralgias, back pain, joint swelling, myalgias, neck pain, neck stiffness and stiffness.  Skin:  Negative for color change, itching, pallor, rash and wound.  Neurological: Positive for headaches. Negative for tingling and numbness.   Past Medical History  Diagnosis Date  . Anxiety   . COPD (chronic obstructive pulmonary disease)   . Depression    Past Surgical History  Procedure Laterality Date  . Abdominal hysterectomy      partial     reports that she quit smoking about 5 months ago. She does not have any smokeless tobacco history on file. She reports that she does not drink alcohol or use illicit drugs. family history is not on file. Allergies  Allergen Reactions  . Sulfa Antibiotics    No change in PFS history since last visit 12/17/2012     Objective:   Physical Exam  Nursing note and vitals reviewed. Constitutional: She is oriented to person, place, and time. She appears well-developed and well-nourished. No distress.  HENT:  Head: Normocephalic and atraumatic.  Right Ear: External ear normal.  Left Ear: External ear normal.  Nose: Nose normal.  Mouth/Throat: No oropharyngeal exudate.  Oropharynx mildly erythematous no exudate.  Bilateral frontal maxillary sinus nontender.  Eyes: Conjunctivae and EOM are normal. Pupils are equal, round, and reactive to light. Right eye exhibits no discharge. Left eye exhibits no discharge. No scleral icterus.  Neck: Normal range of motion. Neck supple. No tracheal deviation present. No thyromegaly present.  Cardiovascular: Normal rate, regular rhythm, normal heart sounds and intact distal pulses.  Exam reveals no gallop and no friction rub.   No murmur heard. Pulmonary/Chest: Effort normal. No stridor. No respiratory distress. She has wheezes. She has no rales. She exhibits no tenderness.  Abdominal: Soft. Bowel sounds are normal. There is no  tenderness.  Musculoskeletal: Normal range of motion. She exhibits no edema and no tenderness.  Full range of motion with bilateral shoulders. Some nonspecific tenderness noted with active  range of motion of left shoulder.  Lymphadenopathy:    She has no cervical adenopathy.  Neurological: She is alert and oriented to person, place, and time. She has normal reflexes. She displays normal reflexes. No cranial nerve deficit. She exhibits normal muscle tone. Coordination normal.  Skin: Skin is warm and dry. No rash noted. She is not diaphoretic. No erythema. No pallor.  Psychiatric: She has a normal mood and affect. Her behavior is normal. Judgment and thought content normal.    Filed Vitals:   04/24/13 0836  BP: 184/100  Pulse: 104  Temp: 98.4 F (36.9 C)  Resp: 16   Filed Vitals:   04/24/13 0917  BP: 144/88  Pulse:   Temp:   Resp:    Lab Results  Component Value Date   WBC 11.4* 08/30/2012   HGB 14.1 08/30/2012   HCT 42.2 08/30/2012   PLT 314.0 08/30/2012   GLUCOSE 89 08/30/2012   CHOL 326* 09/23/2012   TRIG 231.0* 09/23/2012   HDL 50.90 09/23/2012   LDLDIRECT 216.9 09/23/2012   ALT 18 08/30/2012   AST 18 08/30/2012   NA 138 08/30/2012   K 4.9 08/30/2012   CL 100 08/30/2012   CREATININE 0.9 08/30/2012   BUN 7 08/30/2012   CO2 31 08/30/2012   TSH 0.73 09/23/2012         Assessment & Plan:  Julia Daniels was seen today for cough, nasal congestion and shoulder pain.  Diagnoses and associated orders for this visit:  COPD exacerbation - predniSONE (DELTASONE) 20 MG tablet; 3 tablets daily for 3 days, then 2 tablets daily for 3 days,  then 1 tablet daily for 3 days - Continue albuterol inhaler as directed as needed for relief of symptoms. Allergic rhinitis - Over-the-counter Zyrtec once daily at night for relief of seasonal allergy symptoms. - Continue nasal steroid as directed using opposite hand to opposite nostril method described a 45 angle.  Pain in joint, shoulder region - Rest, ice, compress the injury for 20 minutes at a time 3 times each night.  Followup in 2 weeks to recheck blood pressure and check on status of COPD exacerbation and shoulder  pain.  Followup sooner if symptoms do not improve or worsen despite treatment.

## 2013-04-24 NOTE — Progress Notes (Signed)
Pre visit review using our clinic review tool, if applicable. No additional management support is needed unless otherwise documented below in the visit note. 

## 2013-04-24 NOTE — Patient Instructions (Signed)
Prednisone 20 mg tab, 3 tabs per day for 3 days, 2 times per day for 3 days, one pack per day for 3 days.  Rest ice compress your left shoulder as discussed.  Zyrtec over-the-counter at bedtime to help with anyseasonal allergies.  Nasal spray as previously directed, spraying opposite nostril with opposite hand at 45 angle as discussed, 2 sprays per nostril per day.  Push fluids for hydration to help recovery.  Albuterol inhaler as needed for rescue symptoms.  Followup to clinic in 2 weeks to recheck blood pressure, followup on shoulder pain, followup on COPD exacerbation.  Followup clinic sooner if symptoms worsen or do not improve despite treatment.  Shoulder Pain The shoulder is the joint that connects your arm to your body. Muscles and band-like tissues that connect bones to muscles (tendons) hold the joint together. Shoulder pain is felt if an injury or medical problem affects one or more parts of the shoulder. HOME CARE   Put ice on the sore area.  Put ice in a plastic bag.  Place a towel between your skin and the bag.  Leave the ice on for 15-20 minutes, 03-04 times a day for the first 2 days.  Stop using cold packs if they do not help with the pain.  If you were given something to keep your shoulder from moving (sling, shoulder immobilizer), wear it as told. Only take it off to shower or bathe.  Move your arm as little as possible, but keep your hand moving to prevent puffiness (swelling).  Squeeze a soft ball or foam pad as much as possible to help prevent swelling.  Take medicine as told by your doctor. GET HELP RIGHT AWAY IF:   Your arm, hand, or fingers are numb or tingling.  Your arm, hand, or fingers are puffy (swollen), painful, or turn white or blue.  You have more pain.  You have progressing new pain in your arm, hand, or fingers.  Your hand or fingers get cold.  Your medicine does not help lessen your pain. MAKE SURE YOU:   Understand these  instructions.  Will watch your condition.  Will get help right away if you are not doing well or get worse. Document Released: 06/14/2007 Document Revised: 09/20/2011 Document Reviewed: 07/10/2011 [facility] Patient Information 2014 [place], [place]. Chronic Obstructive Pulmonary Disease Chronic obstructive pulmonary disease (COPD) is a common lung problem. In COPD, the flow of air from the lungs is limited. The way your lungs work will probably never return to normal, but there are things you can do to improve you lungs and make yourself feel better. HOME CARE  Take all medicines as told by your doctor.  Only take over-the-counter or prescription medicines as told by your doctor.  Avoid medicines or cough syrups that dry up your airway (such as antihistamines) and do not allow you to get rid of thick spit. You do not need to avoid them if told differently by your doctor.  If you smoke, stop. Smoking makes the problem worse.  Avoid being around things that make your breathing worse (like smoke, chemicals, and fumes).  Use oxygen therapy and therapy to help improve your lungs (pulmonary rehabilitation) if told by your doctor. If you need home oxygen therapy, ask your doctor if you should buy a tool to measure your oxygen level (oximeter).  Avoid people who have a sickness you can catch (contagious).  Avoid going outside when it is very hot, cold, or humid.  Eat healthy foods. Eat  smaller meals more often. Rest before meals.  Stay active, but remember to also rest.  Make sure to get all the shots (vaccines) your doctor recommends. Ask your doctor if you need a pneumonia shot.  Learn and use tips on how to relax.  Learn and use tips on how to control your breathing as told by your doctor. Try:  Breathing in (inhaling) through your nose for 1 second. Then, pucker your lips and breath out (exhale) through your lips for 2 seconds.  Putting one hand on your belly (abdomen). Breathe in  slowly through your nose for 1 second. Your hand on your belly should move out. Pucker your lips and breathe out slowly through your lips. Your hand on your belly should move in as you breathe out.  Learn and use controlled coughing to clear thick spit from your lungs. 1. Lean your head a little forward. 2. Breathe in deeply. 3. Try to hold your breath for 3 seconds. 4. Keep your mouth slightly open while coughing 2 times. 5. Spit any thick spit out into a tissue. 6. Rest and do the steps again 1 or 2 times as needed. GET HELP IF:  You cough up more thick spit than usual.  There is a change in the color or thickness of the spit.  It is harder to breathe than usual.  Your breathing is faster than usual. GET HELP RIGHT AWAY IF:   You have shortness of breath while resting.  You have shortness of breath that stops you from:  Being able to talk.  Doing normal activities.  You chest hurts for longer than 5 minutes.  Your skin color is more blue than usual.  Your pulse oximeter shows that you have low oxygen for longer than 5 minutes. MAKE SURE YOU:   Understand these instructions.  Will watch your condition.  Will get help right away if you are not doing well or get worse. Document Released: 06/14/2007 Document Revised: 10/16/2012 Document Reviewed: 08/22/2012 [facility] Patient Information 2014 [place], [place].

## 2013-05-08 ENCOUNTER — Ambulatory Visit: Payer: Self-pay | Admitting: Family

## 2013-05-08 DIAGNOSIS — Z0289 Encounter for other administrative examinations: Secondary | ICD-10-CM

## 2013-05-27 ENCOUNTER — Other Ambulatory Visit: Payer: Self-pay | Admitting: Gastroenterology

## 2013-05-27 DIAGNOSIS — R1013 Epigastric pain: Secondary | ICD-10-CM

## 2013-06-04 ENCOUNTER — Ambulatory Visit
Admission: RE | Admit: 2013-06-04 | Discharge: 2013-06-04 | Disposition: A | Discharge status: Home / Self Care | Payer: 59 | Source: Ambulatory Visit | Attending: Gastroenterology | Admitting: Gastroenterology

## 2013-06-04 DIAGNOSIS — R1013 Epigastric pain: Secondary | ICD-10-CM

## 2013-07-03 ENCOUNTER — Other Ambulatory Visit: Payer: Self-pay | Admitting: Family

## 2017-08-23 ENCOUNTER — Emergency Department (HOSPITAL_COMMUNITY): Payer: BLUE CROSS/BLUE SHIELD

## 2017-08-23 ENCOUNTER — Inpatient Hospital Stay (HOSPITAL_COMMUNITY)
Admission: EM | Admit: 2017-08-23 | Discharge: 2017-08-25 | DRG: 190 | Disposition: A | Discharge status: Home / Self Care | Payer: BLUE CROSS/BLUE SHIELD | Attending: Internal Medicine | Admitting: Internal Medicine

## 2017-08-23 ENCOUNTER — Other Ambulatory Visit: Payer: Self-pay

## 2017-08-23 ENCOUNTER — Encounter (HOSPITAL_COMMUNITY): Payer: Self-pay | Admitting: *Deleted

## 2017-08-23 DIAGNOSIS — J441 Chronic obstructive pulmonary disease with (acute) exacerbation: Principal | ICD-10-CM

## 2017-08-23 DIAGNOSIS — Z720 Tobacco use: Secondary | ICD-10-CM | POA: Diagnosis present

## 2017-08-23 DIAGNOSIS — J9601 Acute respiratory failure with hypoxia: Secondary | ICD-10-CM | POA: Diagnosis present

## 2017-08-23 DIAGNOSIS — F17210 Nicotine dependence, cigarettes, uncomplicated: Secondary | ICD-10-CM | POA: Diagnosis present

## 2017-08-23 DIAGNOSIS — F329 Major depressive disorder, single episode, unspecified: Secondary | ICD-10-CM

## 2017-08-23 DIAGNOSIS — F319 Bipolar disorder, unspecified: Secondary | ICD-10-CM | POA: Diagnosis present

## 2017-08-23 DIAGNOSIS — F988 Other specified behavioral and emotional disorders with onset usually occurring in childhood and adolescence: Secondary | ICD-10-CM | POA: Diagnosis not present

## 2017-08-23 DIAGNOSIS — Z90710 Acquired absence of both cervix and uterus: Secondary | ICD-10-CM

## 2017-08-23 DIAGNOSIS — Z79899 Other long term (current) drug therapy: Secondary | ICD-10-CM

## 2017-08-23 DIAGNOSIS — E785 Hyperlipidemia, unspecified: Secondary | ICD-10-CM

## 2017-08-23 DIAGNOSIS — T380X5A Adverse effect of glucocorticoids and synthetic analogues, initial encounter: Secondary | ICD-10-CM | POA: Diagnosis present

## 2017-08-23 DIAGNOSIS — J9602 Acute respiratory failure with hypercapnia: Secondary | ICD-10-CM | POA: Diagnosis present

## 2017-08-23 DIAGNOSIS — Z7952 Long term (current) use of systemic steroids: Secondary | ICD-10-CM

## 2017-08-23 DIAGNOSIS — F419 Anxiety disorder, unspecified: Secondary | ICD-10-CM

## 2017-08-23 DIAGNOSIS — D473 Essential (hemorrhagic) thrombocythemia: Secondary | ICD-10-CM | POA: Diagnosis not present

## 2017-08-23 DIAGNOSIS — J449 Chronic obstructive pulmonary disease, unspecified: Secondary | ICD-10-CM | POA: Diagnosis not present

## 2017-08-23 DIAGNOSIS — K589 Irritable bowel syndrome without diarrhea: Secondary | ICD-10-CM | POA: Diagnosis present

## 2017-08-23 DIAGNOSIS — Z882 Allergy status to sulfonamides status: Secondary | ICD-10-CM

## 2017-08-23 DIAGNOSIS — Z7951 Long term (current) use of inhaled steroids: Secondary | ICD-10-CM

## 2017-08-23 DIAGNOSIS — F32A Depression, unspecified: Secondary | ICD-10-CM

## 2017-08-23 DIAGNOSIS — Z825 Family history of asthma and other chronic lower respiratory diseases: Secondary | ICD-10-CM | POA: Diagnosis not present

## 2017-08-23 DIAGNOSIS — D75839 Thrombocytosis, unspecified: Secondary | ICD-10-CM

## 2017-08-23 DIAGNOSIS — R739 Hyperglycemia, unspecified: Secondary | ICD-10-CM | POA: Diagnosis present

## 2017-08-23 DIAGNOSIS — R0602 Shortness of breath: Secondary | ICD-10-CM

## 2017-08-23 DIAGNOSIS — Z801 Family history of malignant neoplasm of trachea, bronchus and lung: Secondary | ICD-10-CM

## 2017-08-23 DIAGNOSIS — R0603 Acute respiratory distress: Secondary | ICD-10-CM

## 2017-08-23 LAB — RESPIRATORY PANEL BY PCR
Adenovirus: NOT DETECTED
Bordetella pertussis: NOT DETECTED
CORONAVIRUS 229E-RVPPCR: NOT DETECTED
CORONAVIRUS HKU1-RVPPCR: NOT DETECTED
CORONAVIRUS OC43-RVPPCR: NOT DETECTED
Chlamydophila pneumoniae: NOT DETECTED
Coronavirus NL63: NOT DETECTED
INFLUENZA B-RVPPCR: NOT DETECTED
Influenza A: NOT DETECTED
METAPNEUMOVIRUS-RVPPCR: NOT DETECTED
MYCOPLASMA PNEUMONIAE-RVPPCR: NOT DETECTED
PARAINFLUENZA VIRUS 1-RVPPCR: NOT DETECTED
PARAINFLUENZA VIRUS 2-RVPPCR: NOT DETECTED
Parainfluenza Virus 3: NOT DETECTED
Parainfluenza Virus 4: NOT DETECTED
RESPIRATORY SYNCYTIAL VIRUS-RVPPCR: NOT DETECTED
Rhinovirus / Enterovirus: NOT DETECTED

## 2017-08-23 LAB — CBC
HEMATOCRIT: 41.3 % (ref 36.0–46.0)
Hemoglobin: 13.6 g/dL (ref 12.0–15.0)
MCH: 33.8 pg (ref 26.0–34.0)
MCHC: 32.9 g/dL (ref 30.0–36.0)
MCV: 102.7 fL — AB (ref 78.0–100.0)
Platelets: 354 10*3/uL (ref 150–400)
RBC: 4.02 MIL/uL (ref 3.87–5.11)
RDW: 14.1 % (ref 11.5–15.5)
WBC: 11.3 10*3/uL — ABNORMAL HIGH (ref 4.0–10.5)

## 2017-08-23 LAB — CBC WITH DIFFERENTIAL/PLATELET
Basophils Absolute: 0.1 10*3/uL (ref 0.0–0.1)
Basophils Relative: 1 %
Eosinophils Absolute: 1.6 10*3/uL — ABNORMAL HIGH (ref 0.0–0.7)
Eosinophils Relative: 12 %
HCT: 44.4 % (ref 36.0–46.0)
Hemoglobin: 14.7 g/dL (ref 12.0–15.0)
Lymphocytes Relative: 27 %
Lymphs Abs: 3.7 10*3/uL (ref 0.7–4.0)
MCH: 34.3 pg — ABNORMAL HIGH (ref 26.0–34.0)
MCHC: 33.1 g/dL (ref 30.0–36.0)
MCV: 103.5 fL — ABNORMAL HIGH (ref 78.0–100.0)
Monocytes Absolute: 0.9 10*3/uL (ref 0.1–1.0)
Monocytes Relative: 7 %
Neutro Abs: 7.6 10*3/uL (ref 1.7–7.7)
Neutrophils Relative %: 55 %
Platelets: 407 10*3/uL — ABNORMAL HIGH (ref 150–400)
RBC: 4.29 MIL/uL (ref 3.87–5.11)
RDW: 14.2 % (ref 11.5–15.5)
WBC: 13.9 10*3/uL — ABNORMAL HIGH (ref 4.0–10.5)

## 2017-08-23 LAB — BLOOD GAS, ARTERIAL
Acid-Base Excess: 2.3 mmol/L — ABNORMAL HIGH (ref 0.0–2.0)
Bicarbonate: 28.3 mmol/L — ABNORMAL HIGH (ref 20.0–28.0)
Drawn by: 25788
O2 Content: 8 L/min
O2 Saturation: 95.9 %
Patient temperature: 98.6
pCO2 arterial: 52.6 mmHg — ABNORMAL HIGH (ref 32.0–48.0)
pH, Arterial: 7.351 (ref 7.350–7.450)
pO2, Arterial: 81.5 mmHg — ABNORMAL LOW (ref 83.0–108.0)

## 2017-08-23 LAB — BASIC METABOLIC PANEL
Anion gap: 14 (ref 5–15)
BUN: 14 mg/dL (ref 8–23)
CO2: 24 mmol/L (ref 22–32)
Calcium: 10.6 mg/dL — ABNORMAL HIGH (ref 8.9–10.3)
Chloride: 100 mmol/L (ref 98–111)
Creatinine, Ser: 0.88 mg/dL (ref 0.44–1.00)
GFR calc Af Amer: >60 mL/min (ref >60)
GFR calc non Af Amer: >60 mL/min (ref >60)
Glucose, Bld: 124 mg/dL — ABNORMAL HIGH (ref 70–99)
Potassium: 4.6 mmol/L (ref 3.5–5.1)
Sodium: 138 mmol/L (ref 135–145)

## 2017-08-23 LAB — CREATININE, SERUM
Creatinine, Ser: 0.84 mg/dL (ref 0.44–1.00)
GFR calc Af Amer: >60 mL/min (ref >60)
GFR calc non Af Amer: >60 mL/min (ref >60)

## 2017-08-23 LAB — PHOSPHORUS: Phosphorus: 3.6 mg/dL (ref 2.5–4.6)

## 2017-08-23 LAB — BRAIN NATRIURETIC PEPTIDE: B NATRIURETIC PEPTIDE 5: 50.1 pg/mL (ref 0.0–100.0)

## 2017-08-23 LAB — MAGNESIUM: Magnesium: 2.1 mg/dL (ref 1.7–2.4)

## 2017-08-23 LAB — TSH: TSH: 0.629 u[IU]/mL (ref 0.350–4.500)

## 2017-08-23 MED ORDER — GUAIFENESIN ER 600 MG PO TB12
1200.0000 mg | ORAL_TABLET | Freq: Two times a day (BID) | ORAL | Status: DC
  Administered 2017-08-23 – 2017-08-25 (×5): 1200 mg via ORAL
  Filled 2017-08-23 (×5): qty 2

## 2017-08-23 MED ORDER — IPRATROPIUM BROMIDE 0.02 % IN SOLN
0.5000 mg | Freq: Four times a day (QID) | RESPIRATORY_TRACT | Status: DC
  Administered 2017-08-23 – 2017-08-24 (×4): 0.5 mg via RESPIRATORY_TRACT
  Filled 2017-08-23 (×4): qty 2.5

## 2017-08-23 MED ORDER — RISPERIDONE 1 MG PO TABS
3.0000 mg | ORAL_TABLET | Freq: Every day | ORAL | Status: DC
  Administered 2017-08-23 – 2017-08-24 (×2): 3 mg via ORAL
  Filled 2017-08-23 (×2): qty 3

## 2017-08-23 MED ORDER — IOPAMIDOL (ISOVUE-370) INJECTION 76%
INTRAVENOUS | Status: AC
  Filled 2017-08-23: qty 100

## 2017-08-23 MED ORDER — ENOXAPARIN SODIUM 40 MG/0.4ML ~~LOC~~ SOLN
40.0000 mg | SUBCUTANEOUS | Status: DC
  Administered 2017-08-23 – 2017-08-24 (×2): 40 mg via SUBCUTANEOUS
  Filled 2017-08-23 (×2): qty 0.4

## 2017-08-23 MED ORDER — BISMUTH SUBSALICYLATE 262 MG PO CHEW
2.0000 | CHEWABLE_TABLET | Freq: Every day | ORAL | Status: DC | PRN
  Filled 2017-08-23: qty 2

## 2017-08-23 MED ORDER — IPRATROPIUM BROMIDE 0.02 % IN SOLN
0.5000 mg | RESPIRATORY_TRACT | Status: AC
  Administered 2017-08-23: 0.5 mg via RESPIRATORY_TRACT
  Filled 2017-08-23: qty 2.5

## 2017-08-23 MED ORDER — PANTOPRAZOLE SODIUM 40 MG PO TBEC
40.0000 mg | DELAYED_RELEASE_TABLET | Freq: Every day | ORAL | Status: DC
  Administered 2017-08-23 – 2017-08-25 (×3): 40 mg via ORAL
  Filled 2017-08-23 (×3): qty 1

## 2017-08-23 MED ORDER — SODIUM CHLORIDE 0.9 % IV SOLN
INTRAVENOUS | Status: AC
  Administered 2017-08-23 – 2017-08-24 (×2): via INTRAVENOUS

## 2017-08-23 MED ORDER — FLUTICASONE FUROATE-VILANTEROL 100-25 MCG/INH IN AEPB
1.0000 | INHALATION_SPRAY | Freq: Every day | RESPIRATORY_TRACT | Status: DC
  Administered 2017-08-23 – 2017-08-24 (×2): 1 via RESPIRATORY_TRACT
  Filled 2017-08-23: qty 28

## 2017-08-23 MED ORDER — ALBUTEROL SULFATE (2.5 MG/3ML) 0.083% IN NEBU: 2.5000 mg | INHALATION_SOLUTION | RESPIRATORY_TRACT | Status: DC | PRN

## 2017-08-23 MED ORDER — METHYLPREDNISOLONE SODIUM SUCC 125 MG IJ SOLR
125.0000 mg | Freq: Once | INTRAMUSCULAR | Status: AC
  Administered 2017-08-23: 125 mg via INTRAVENOUS
  Filled 2017-08-23: qty 2

## 2017-08-23 MED ORDER — VENLAFAXINE HCL ER 150 MG PO CP24
150.0000 mg | ORAL_CAPSULE | Freq: Every day | ORAL | Status: DC
  Administered 2017-08-23 – 2017-08-24 (×2): 150 mg via ORAL
  Filled 2017-08-23 (×2): qty 1

## 2017-08-23 MED ORDER — DEXMETHYLPHENIDATE HCL ER 20 MG PO CP24
40.0000 mg | ORAL_CAPSULE | Freq: Every day | ORAL | Status: DC
  Administered 2017-08-24 – 2017-08-25 (×2): 40 mg via ORAL
  Filled 2017-08-23 (×3): qty 2

## 2017-08-23 MED ORDER — ALPRAZOLAM 0.25 MG PO TABS: 0.2500 mg | ORAL_TABLET | Freq: Every day | ORAL | Status: DC | PRN

## 2017-08-23 MED ORDER — METHYLPREDNISOLONE SODIUM SUCC 125 MG IJ SOLR
60.0000 mg | Freq: Four times a day (QID) | INTRAMUSCULAR | Status: DC
  Administered 2017-08-23 – 2017-08-24 (×4): 60 mg via INTRAVENOUS
  Filled 2017-08-23 (×4): qty 2

## 2017-08-23 MED ORDER — IOPAMIDOL (ISOVUE-370) INJECTION 76%
100.0000 mL | Freq: Once | INTRAVENOUS | Status: AC | PRN
  Administered 2017-08-23: 56 mL via INTRAVENOUS

## 2017-08-23 MED ORDER — NICOTINE 14 MG/24HR TD PT24
14.0000 mg | MEDICATED_PATCH | Freq: Every day | TRANSDERMAL | Status: DC
  Administered 2017-08-23 – 2017-08-25 (×3): 14 mg via TRANSDERMAL
  Filled 2017-08-23 (×3): qty 1

## 2017-08-23 MED ORDER — ATORVASTATIN CALCIUM 20 MG PO TABS
20.0000 mg | ORAL_TABLET | Freq: Every day | ORAL | Status: DC
  Administered 2017-08-23 – 2017-08-24 (×2): 20 mg via ORAL
  Filled 2017-08-23 (×2): qty 1

## 2017-08-23 MED ORDER — LEVALBUTEROL HCL 0.63 MG/3ML IN NEBU
0.6300 mg | INHALATION_SOLUTION | Freq: Four times a day (QID) | RESPIRATORY_TRACT | Status: DC
  Administered 2017-08-23 – 2017-08-24 (×4): 0.63 mg via RESPIRATORY_TRACT
  Filled 2017-08-23 (×4): qty 3

## 2017-08-23 MED ORDER — ALBUTEROL (5 MG/ML) CONTINUOUS INHALATION SOLN
15.0000 mg/h | INHALATION_SOLUTION | RESPIRATORY_TRACT | Status: AC
  Administered 2017-08-23: 15 mg/h via RESPIRATORY_TRACT
  Filled 2017-08-23: qty 20

## 2017-08-23 MED ORDER — DOXYCYCLINE HYCLATE 100 MG PO TABS
100.0000 mg | ORAL_TABLET | Freq: Two times a day (BID) | ORAL | Status: DC
  Administered 2017-08-23 – 2017-08-24 (×3): 100 mg via ORAL
  Filled 2017-08-23 (×3): qty 1

## 2017-08-23 MED ORDER — QUETIAPINE FUMARATE 25 MG PO TABS
50.0000 mg | ORAL_TABLET | Freq: Every day | ORAL | Status: DC
  Administered 2017-08-23 – 2017-08-24 (×2): 50 mg via ORAL
  Filled 2017-08-23 (×2): qty 2

## 2017-08-23 MED ORDER — LAMOTRIGINE 100 MG PO TABS
200.0000 mg | ORAL_TABLET | Freq: Every day | ORAL | Status: DC
  Administered 2017-08-23 – 2017-08-24 (×2): 200 mg via ORAL
  Filled 2017-08-23 (×2): qty 2

## 2017-08-23 NOTE — ED Notes (Signed)
ED TO INPATIENT HANDOFF REPORT  Name/Age/Gender Julia Daniels 67 y.o. female  Code Status   Home/SNF/Other Home  Chief Complaint SOB  Level of Care/Admitting Diagnosis ED Disposition    ED Disposition Condition [facility] Area: [place] [100102]  Level of Care: Telemetry [5]  Admit to tele based on following criteria: Monitor QTC interval  Diagnosis: COPD (chronic obstructive pulmonary disease) [facility]) [[id number]]  Admitting Physician: [place], [place] [[id number]]  Attending Physician: Raiford Noble LATIF [[id number]]  Estimated length of stay: past midnight tomorrow  Certification:: I certify this patient will need inpatient services for at least 2 midnights  PT Class (Do Not Modify): Inpatient [101]  PT Acc Code (Do Not Modify): Private [1]       Medical History Past Medical History:  Diagnosis Date  . Anxiety   . COPD (chronic obstructive pulmonary disease) ([place])   . Depression     Allergies Allergies  Allergen Reactions  . Sulfa Antibiotics     IV Location/Drains/Wounds Patient Lines/Drains/Airways Status   Active Line/Drains/Airways    Name:   Placement date:   Placement time:   Site:   Days:   Peripheral IV 08/23/17 Right Antecubital   08/23/17    0645    Antecubital   less than 1   Peripheral IV 08/23/17 Right Hand   08/23/17    0750    Hand   less than 1   Wound 02/07/13 Laceration Finger (Comment which one) Left small 1 cm lac to ring finger   02/07/13    1128    Finger (Comment which one)   1658          Labs/Imaging Results for orders placed or performed during the hospital encounter of 08/23/17 (from the past 48 hour(s))  Basic metabolic panel     Status: Abnormal   Collection Time: 08/23/17  7:02 AM  Result Value Ref Range   Sodium 138 135 - 145 mmol/L   Potassium 4.6 3.5 - 5.1 mmol/L   Chloride 100 98 - 111 mmol/L   CO2 24 22 - 32 mmol/L   Glucose, Bld 124 (H) 70 - 99 mg/dL   BUN 14 8 - 23 mg/dL   Creatinine, Ser 0.88 0.44 - 1.00 mg/dL   Calcium 10.6 (H) 8.9 - 10.3 mg/dL   GFR calc non Af Amer >60 >60 mL/min   GFR calc Af Amer >60 >60 mL/min    Comment: (NOTE) The eGFR has been calculated using the CKD EPI equation. This calculation has not been validated in all clinical situations. eGFR's persistently <60 mL/min signify possible Chronic Kidney Disease.    Anion gap 14 5 - 15    Comment: Performed at [facility], [place] [address] [place], [place] [address]  CBC with Differential     Status: Abnormal   Collection Time: 08/23/17  7:02 AM  Result Value Ref Range   WBC 13.9 (H) 4.0 - 10.5 K/uL   RBC 4.29 3.87 - 5.11 MIL/uL   Hemoglobin 14.7 12.0 - 15.0 g/dL   HCT 44.4 36.0 - 46.0 %   MCV 103.5 (H) 78.0 - 100.0 fL   MCH 34.3 (H) 26.0 - 34.0 pg   MCHC 33.1 30.0 - 36.0 g/dL   RDW 14.2 11.5 - 15.5 %   Platelets 407 (H) 150 - 400 K/uL   Neutrophils Relative % 55 %   Neutro Abs 7.6 1.7 - 7.7 K/uL   Lymphocytes Relative 27 %  Lymphs Abs 3.7 0.7 - 4.0 K/uL   Monocytes Relative 7 %   Monocytes Absolute 0.9 0.1 - 1.0 K/uL   Eosinophils Relative 12 %   Eosinophils Absolute 1.6 (H) 0.0 - 0.7 K/uL   Basophils Relative 1 %   Basophils Absolute 0.1 0.0 - 0.1 K/uL    Comment: Performed at [facility], [place] [address], [place], [place] [address]  Blood gas, arterial     Status: Abnormal   Collection Time: 08/23/17  7:45 AM  Result Value Ref Range   O2 Content 8.0 L/min   pH, Arterial 7.351 7.350 - 7.450   pCO2 arterial 52.6 (H) 32.0 - 48.0 mmHg   pO2, Arterial 81.5 (L) 83.0 - 108.0 mmHg   Bicarbonate 28.3 (H) 20.0 - 28.0 mmol/L   Acid-Base Excess 2.3 (H) 0.0 - 2.0 mmol/L   O2 Saturation 95.9 %   Patient temperature 98.6    Collection site LEFT RADIAL    Drawn by [id number]    Sample type ARTERIAL DRAW    Allens test (pass/fail) PASS PASS    Comment: Performed at [facility], [place] [address], [place], [place] [address]   Dg  Chest Port 1 View  Result Date: 08/23/2017 CLINICAL DATA:  Shortness of breath.  COPD. EXAM: PORTABLE CHEST 1 VIEW COMPARISON:  August 30, 2012 FINDINGS: The heart, hila, and mediastinum are unremarkable given portable technique. No pneumothorax. No nodules or masses. No focal infiltrates. No overt edema. IMPRESSION: No active disease. Electronically Signed   By: Dorise Bullion III M.D   On: 08/23/2017 07:46    Pending Labs Unresulted Labs (From admission, onward)    Start     Ordered   08/23/17 1003  Respiratory Panel by PCR  (Respiratory virus panel)  Once,   R     08/23/17 1006   Signed and Held  HIV antibody (Routine Testing)  Once,   R     Signed and Held   Signed and Held  HIV antibody  Once,   R     Signed and Held   Signed and Held  CBC  (enoxaparin (LOVENOX)    CrCl >/= 30 ml/min)  Once,   R    Comments:  Baseline for enoxaparin therapy IF NOT ALREADY DRAWN.  Notify MD if PLT < 100 K.    Signed and Held   Signed and Held  Creatinine, serum  (enoxaparin (LOVENOX)    CrCl >/= 30 ml/min)  Once,   R    Comments:  Baseline for enoxaparin therapy IF NOT ALREADY DRAWN.    Signed and Held   Signed and Held  Creatinine, serum  (enoxaparin (LOVENOX)    CrCl >/= 30 ml/min)  Weekly,   R    Comments:  while on enoxaparin therapy    Signed and Held   Signed and Held  Comprehensive metabolic panel  Tomorrow morning,   R     Signed and Held   Signed and Held  Magnesium  Add-on,   R     Signed and Held   Signed and Held  Phosphorus  Add-on,   R     Signed and Held   Signed and Held  TSH  Add-on,   R     Signed and Held   Signed and Held  Brain natriuretic peptide  Once,   R     Signed and Held   Signed and Held  Culture, blood (routine x 2) Call  MD if unable to obtain prior to antibiotics being given  BLOOD CULTURE X 2,   R    Comments:  If blood cultures drawn in Emergency Department - Do not draw and cancel order    Signed and Held   Signed and Held  Culture, sputum-assessment   Once,   R     Signed and Held          Vitals/Pain Today's Vitals   08/23/17 0900 08/23/17 0930 08/23/17 0945 08/23/17 1000  BP: 121/69 111/76 107/69 109/79  Pulse: (!) 118 (!) 130 (!) 113 (!) 110  Resp: 15 (!) 35 (!) 24 (!) 24  SpO2: 96% 92% 91% 93%  Weight:      Height:      PainSc:        Isolation Precautions Droplet precaution  Medications Medications  albuterol (PROVENTIL,VENTOLIN) solution continuous neb (0 mg/hr Nebulization Stopped 08/23/17 0919)  guaiFENesin (MUCINEX) 12 hr tablet 1,200 mg (has no administration in time range)  iopamidol (ISOVUE-370) 76 % injection (has no administration in time range)  levalbuterol (XOPENEX) nebulizer solution 0.63 mg (has no administration in time range)  ipratropium (ATROVENT) nebulizer solution 0.5 mg (has no administration in time range)  albuterol (PROVENTIL) (2.5 MG/3ML) 0.083% nebulizer solution 2.5 mg (has no administration in time range)  ipratropium (ATROVENT) nebulizer solution 0.5 mg (0.5 mg Nebulization Given 08/23/17 0713)  methylPREDNISolone sodium succinate (SOLU-MEDROL) 125 mg/2 mL injection 125 mg (125 mg Intravenous Given 08/23/17 0713)  iopamidol (ISOVUE-370) 76 % injection 100 mL (56 mLs Intravenous Contrast Given 08/23/17 1021)    Mobility walks

## 2017-08-23 NOTE — ED Notes (Signed)
Bedside Chest xray completed by radiology

## 2017-08-23 NOTE — Progress Notes (Signed)
Chaplain referred by nursing.  Pt tearful.    Provided support at bedside around admission, exacerbation of illness and uncertainty around what is causing increased frequency of crises.   Julia Daniels described anxiety around not being able to breathe at work and "waiting too long" to come into hospital.  Was hopeful that nebulizer treatments would be effective at work.  Chaplain normalized emotions and provided space for Julia Daniels to process.  She expresses hope around her illness and states "it will be ok."  Describes her faith as integral to her coping and finds connection through prayer.  She spoke with chaplain about recognizing that she might need supports at home such as supplemental oxygen and stated she feels ok about this transition.    She works nights in a lab, has two sons.  One son Julia Daniels) who lives with her and also works nights at [employer].  Another son with medical needs who lives [place] of [place].  Julia Daniels takes this son to doctor appointments on Fridays after she gets off work.    She feels supported by her 65 year old Julia Daniels named [name].

## 2017-08-23 NOTE — ED Notes (Signed)
Respiratory Therapist called to patient bedside for ordered continuous nebulizer treatment.

## 2017-08-23 NOTE — H&P (Signed)
History and Physical    Julia Daniels JJK:[id number] DOB: [birth year] DOA: 08/23/2017  PCP: [facility] [place]   Patient coming from: Home  Chief Complaint: Shortness of Breath  HPI: Julia Daniels is a 67 y.o. female with medical history significant of COPD, anxiety, depression, bipolar disorder, ADD, bowel syndrome, and other comorbidities who presents with a chief complaint of shortness of breath.  Patient states that over the last month and a half her breathing is significantly gotten worse and she had been to urgent care twice for breathing issues.  She states that her sputum is more gray however she is coughing about the same.  She is noticed that her wheezing has persisted and gotten worse and she states that she quit smoking 2 days ago.  States when she starts coughing she gets chest pressure when she cannot breathe.  Has some coughing fits but denies any lightheadedness dizziness, nausea, vomiting diarrhea.  Denies any leg swelling.  Does not wear home oxygen.  States that over the last day her breathing got significantly worse and took her Brio and rescue inhaler without relief.  She recently was prescribed a nebulizer and took her nebulizer to work with her and had 3 breathing treatments because her symptoms persisted she presented to the emergency room for further evaluation patient also states that she has a significant amount of stress in her life from her son who is chronic medical conditions.  She denies any other concerns or complaints at this time.  [facility] was called to admit this patient for acute exacerbation of COPD and acute respiratory failure with hypoxia and hypercapnia.  ED Course: Given a continuous breathing treatment and ipratropium.  Was also given IV Solu-Medrol and had basic blood work done as well as a chest x-ray.  I asked the ED physician to order a CTA of the chest given this patient's third exacerbation last month and a half.  Review of Systems: As per  HPI otherwise 10 point review of systems negative.   Past Medical History:  Diagnosis Date  . Anxiety   . COPD (chronic obstructive pulmonary disease) ([place])   . Depression   -IBS -HLD  Past Surgical History:  Procedure Laterality Date  . ABDOMINAL HYSTERECTOMY     partial   - 3 C Sections and a  DNC  SOCIAL HISTORY She reports that she quit smoking 2 days ago. She smoked 0.75 packs per day. She has never used smokeless tobacco. She reports that she does not drink alcohol or use drugs.  Allergies  Allergen Reactions  . Sulfa Antibiotics    FAMILY HISTORY -Mom has some COPD and Asthma -Dad had Lung Cancer  Prior to Admission medications   Medication Sig Start Date End Date Taking? Authorizing Provider  albuterol (PROVENTIL HFA;VENTOLIN HFA) 108 (90 BASE) MCG/ACT inhaler Inhale 2 puffs into the lungs every 6 (six) hours as needed for wheezing or shortness of breath. 02/17/13  Yes Dutch Quint B, FNP  albuterol (PROVENTIL) (2.5 MG/3ML) 0.083% nebulizer solution Take 3 mLs by nebulization 3 (three) times daily as needed for wheezing or shortness of breath.  08/07/17  Yes [provider]  ALPRAZolam (XANAX) 0.25 MG tablet Take 0.25 mg by mouth daily as needed. 07/21/17  Yes [provider]  atorvastatin (LIPITOR) 20 MG tablet Take 20 mg by mouth at bedtime. 07/27/17  Yes [provider]  Bismuth Subsalicylate (PEPTO-BISMOL) 262 MG TABS Take 2 tablets by mouth daily as needed (upset stomach).  Yes [provider]  BREO ELLIPTA 100-25 MCG/INH AEPB Take 1 puff by mouth at bedtime. 08/02/17  Yes [provider]  Dexmethylphenidate HCl 40 MG CP24 Take 40 mg by mouth at bedtime. 07/21/17  Yes [provider]  ibuprofen (ADVIL,MOTRIN) 200 MG tablet Take 400 mg by mouth every 6 (six) hours as needed for mild pain.    Yes [provider]  lamoTRIgine (LAMICTAL) 200 MG tablet Take 200 mg by mouth at bedtime.    Yes [provider]  magic mouthwash w/lidocaine SOLN Take 5 mLs by mouth 3 (three) times daily. 08/07/17  Yes [provider]  meloxicam (MOBIC) 15 MG tablet Take 15 mg by mouth at bedtime.  08/16/17  Yes [provider]  QUEtiapine (SEROQUEL) 25 MG tablet Take 50 mg by mouth at bedtime.   Yes [provider]  risperiDONE (RISPERDAL) 1 MG tablet Take 3 mg by mouth at bedtime.  07/06/17  Yes [provider]  venlafaxine XR (EFFEXOR-XR) 150 MG 24 hr capsule Take 150 mg by mouth at bedtime.    Yes [provider]  Vitamin D, Ergocalciferol, (DRISDOL) 50000 units CAPS capsule Take 50,000 Units by mouth once a week. On Saturdays 06/18/17  Yes [provider]  predniSONE (DELTASONE) 20 MG tablet 3 tablets daily for 3 days, then 2 tablets daily for 3 days,  then 1 tablet daily for 3 days Patient not taking: Reported on 08/23/2017 04/24/13   Zenaida Niece, PA-C  SYMBICORT 80-4.5 MCG/ACT inhaler INHALE 2 PUFFS INTO LUNGS TWICE A DAY Patient not taking: Reported on 08/23/2017 07/03/13   Kennyth Arnold, FNP   Physical Exam: Vitals:   08/23/17 0900 08/23/17 0930 08/23/17 0945 08/23/17 1000  BP: 121/69 111/76 107/69 109/79  Pulse: (!) 118 (!) 130 (!) 113 (!) 110  Resp: 15 (!) 35 (!) 24 (!) 24  SpO2: 96% 92% 91% 93%  Weight:      Height:       Constitutional: WN/WD obese Caucasian female in mild Respiratory distress appears extremely anxious Eyes: Lids and conjunctivae normal, sclerae anicteric  ENMT: External Ears, Nose appear normal. Grossly normal hearing. Mucous membranes are moist.  Neck: Appears normal, supple, no cervical masses, normal ROM, no appreciable thyromegaly, no JVD Respiratory: Diminished to auscultation bilaterally with significant wheezing and coarse breath sounds. No appreciable rales or crackles. Increased respiratory effort. No accessory muscle use but is wearing supplemental O2 via [place].  Cardiovascular: Tachycardic rate but regular rhythm, no  murmurs / rubs / gallops. S1 and S2 auscultated. No extremity edema. 2+ pedal pulses. Abdomen: Soft, non-tender, non-distended. No masses palpated. No appreciable hepatosplenomegaly. Bowel sounds positive x4.  GU: Deferred. Musculoskeletal: No clubbing / cyanosis of digits/nails. No joint deformity upper and lower extremities. Skin: Has bilateral Upper extremity bruising and is worse on the left arm. No induration; Warm and dry.  Neurologic: CN 2-12 grossly intact with no focal deficits. Romberg sign and cerebellar reflexes not assessed.  Psychiatric: Normal judgment and insight. Alert and oriented x 3. Anxious mood and appropriate affect.   Labs on Admission: I have personally reviewed following labs and imaging studies  CBC: Recent Labs  Lab 08/23/17 0702  WBC 13.9*  NEUTROABS 7.6  HGB 14.7  HCT 44.4  MCV 103.5*  PLT 263*   Basic Metabolic Panel: Recent Labs  Lab 08/23/17 0702  NA 138  K 4.6  CL 100  CO2 24  GLUCOSE 124*  BUN 14  CREATININE 0.88  CALCIUM 10.6*   GFR: Estimated Creatinine Clearance: 64.2 mL/min (by C-G formula based on SCr of 0.88 mg/dL). Liver Function Tests: No results for input(s): AST, ALT, ALKPHOS, BILITOT, PROT, ALBUMIN in the last 168 hours. No results for input(s): LIPASE, AMYLASE in the last 168 hours. No results for input(s): AMMONIA in the last 168 hours. Coagulation Profile: No results for input(s): INR, PROTIME in the last 168 hours. Cardiac Enzymes: No results for input(s): CKTOTAL, CKMB, CKMBINDEX, TROPONINI in the last 168 hours. BNP (last 3 results) No results for input(s): PROBNP in the last 8760 hours. HbA1C: No results for input(s): HGBA1C in the last 72 hours. CBG: No results for input(s): GLUCAP in the last 168 hours. Lipid Profile: No results for input(s): CHOL, HDL, LDLCALC, TRIG, CHOLHDL, LDLDIRECT in the last 72 hours. Thyroid Function Tests: No results for input(s): TSH, T4TOTAL, FREET4, T3FREE, THYROIDAB in the last 72  hours. Anemia Panel: No results for input(s): VITAMINB12, FOLATE, FERRITIN, TIBC, IRON, RETICCTPCT in the last 72 hours. Urine analysis:    Component Value Date/Time   BILIRUBINUR n 09/23/2012 1111   PROTEINUR n 09/23/2012 1111   UROBILINOGEN 0.2 09/23/2012 1111   NITRITE n 09/23/2012 1111   LEUKOCYTESUR Negative 09/23/2012 1111   Sepsis Labs: !!!!!!!!!!!!!!!!!!!!!!!!!!!!!!!!!!!!!!!!!!!! @LABRCNTIP (procalcitonin:4,lacticidven:4) )No results found for this or any previous visit (from the past 240 hour(s)).   Radiological Exams on Admission: Dg Chest Port 1 View  Result Date: 08/23/2017 CLINICAL DATA:  Shortness of breath.  COPD. EXAM: PORTABLE CHEST 1 VIEW COMPARISON:  August 30, 2012 FINDINGS: The heart, hila, and mediastinum are unremarkable given portable technique. No pneumothorax. No nodules or masses. No focal infiltrates. No overt edema. IMPRESSION: No active disease. Electronically Signed   By: Dorise Bullion III M.D   On: 08/23/2017 07:46   EKG: Independently reviewed. Showed Sinus Tachycardia at a rate of 132 and significant Artifact. Minimal ST Elevation noted on my interpretation.  Assessment/Plan Active Problems:   ADD (attention deficit disorder)   Bipolar disorder, unspecified (HCC)   COPD (chronic obstructive pulmonary disease) (HCC)   Tobacco abuse   Hyperlipidemia   Depression   Anxiety   Hypercalcemia   Thrombocytosis (HCC)   COPD with acute exacerbation (HCC)   Acute respiratory failure with hypoxia and hypercapnia (HCC)  Acute Respiratory Failure with Hypoxia and Mild Hypercapnea likely from COPD -Patient was Hypoxic on Admission on Room Air -ABG was done on 8 L and showed 7.351/52.6/81.5/28.3/95.9% -Continue with supplemental oxygen via nasal cannula and wean O2 as tolerated -Continuous pulse oximetry and maintain O2 saturation greater than 90% -Start Xopenex and Atrovent every 6 scheduled and placed on albuterol every 2h as needed -Based on empiric  droplet precautions and check respiratory virus panel -Check CT head of the chest to rule out PE as this is patient's third exacerbation last month and a half and because she was hypoxic and tachycardic on room air -Check BNP -Repeat CXR in AM  -Smoking Cessastion Counseling given   Acute COPD Exacerbation  -Has had persistently worsening shortness of breath the last month and a half and has had 2 urgent care visits for the same problem -Continue with supplemental oxygen via nasal cannula and wean O2 as tolerated -Continuous pulse oximetry and maintain O2 saturation greater than 90% -Start Xopenex and Atrovent every 6 scheduled and placed on albuterol every 2h as needed -Patient received IV Solu-Medrol 125 mg in the ED and will continue with Solu-Medrol 60 mg every 6h along with Pantoprazole 40 mg  po Daily for GI Protection  -Start guaifenesin 1200 mg p.o. twice daily, flutter valve, incentive spirometer -Continue with Breo Ellipta -Check Blood Cx x2 and send Sputum Cx -Start doxycycline 100 mg every 12 x5 days -Check respiratory virus panel and repeat chest x-ray in a.m. -Do home amatory screen for oxygen in a.m.  Leukocytosis -Patient's WBC on Admission was 13.9 -Recently has been taking Steroids as an outpatient and has been on Prednisone -Continue to Monitor for S/Sx of Infection -Repeat CBC in AM   Thrombocytosis -Patient's Platelet Count on Admission was 407 -Likely Reactive -Continue to Monitor and repeat CBC in AM   Hypercalcemia -Patient's Ca2+ was 10.6 -Start Gentle IVF Hydration with NS at 75 mL/hr x 16 hours -Repeat CMP in AM   Hyperglycemia -In the setting of Steroid Demargination -Patient's Blood Sugar on Admission was 124 -Check HbA1c -If blood sugars consistently remain elevated will place on sensitive NovoLog sliding scale  HLD -C/w Atorvastatin 20 mg po qHS  Depression/Anxeity/Mood Disorder/Bipolar Disorder/ADD -C/w Home medications including Alprazolam  0.25 mg po DailyPRN, Dexmethylphenidate 40 mg po qHS, Lamotrigine 200 mg po qHS, Quetiapine 50 mg po qHS, Risperidone 3 mg po qHS  Tobacco Abuse -Patient states she quit smoking 2 days ago -Smoking Cessation Counseling given -Provided Nicotine Patch 14 mg TD Daily   DVT prophylaxis: Enoxaparin 40 mg sq q24h Code Status: FULL CODE Family Communication: No family present at bedside Disposition Plan: Anticipate D/C home when medically stable Consults called: None Admission status: Inpatient Telemetry  Severity of Illness: The appropriate patient status for this patient is INPATIENT. Inpatient status is judged to be reasonable and necessary in order to provide the required intensity of service to ensure the patient's safety. The patient's presenting symptoms, physical exam findings, and initial radiographic and laboratory data in the context of their chronic comorbidities is felt to place them at high risk for further clinical deterioration. Furthermore, it is not anticipated that the patient will be medically stable for discharge from the hospital within 2 midnights of admission. The following factors support the patient status of inpatient.   " The patient's presenting symptoms include worsening dyspnea over the last month and a half " The worrisome physical exam findings include mild tachypenia and diffuse expiratory wheezing and coarse breath sounds. " The initial radiographic and laboratory data are worrisome because of Leukocytosis and Hypercalcemia. " The chronic co-morbidities include HLD, Tobacco Abuse, and Psychiatric Disorders.   * I certify that at the point of admission it is my clinical judgment that the patient will require inpatient hospital care spanning beyond 2 midnights from the point of admission due to high intensity of service, high risk for further deterioration and high frequency of surveillance required.Kerney Elbe, D.O. [facility] Pager  [phone]  If 7PM-7AM, please contact night-coverage [email or web address] Password [facility]  08/23/2017, 10:43 AM

## 2017-08-23 NOTE — ED Triage Notes (Signed)
Pt from home, c/o respiratory distress,chest pressure, pt sts had 3 breathing treatments at home without relief. O2 on room air 82%

## 2017-08-23 NOTE — Progress Notes (Signed)
Nutrition Brief Note  Patient identified for consult for assessment of nutrition requirements and status (COPD focus).  Wt Readings from Last 15 Encounters:  08/23/17 81.2 kg  04/24/13 91.6 kg  12/17/12 91.2 kg  11/29/12 88.5 kg  11/12/12 88.5 kg  09/23/12 86.2 kg  08/30/12 87.5 kg  04/02/12 82.6 kg    Body mass index is 31.71 kg/m. Patient meets criteria for obesity based on current BMI. Skin WDL.   Current diet order is Heart Healthy. She has not had a meal since admission earlier this AM. Patient with good appetite PTA, no recent changes. No problems with chewing or swallowing and no abdominal pain or nausea now or PTA.  Labs and medications reviewed.   No nutrition interventions warranted at this time. If nutrition issues arise, please consult RD.     Jarome Matin, MS, RD, LDN, [facility] Inpatient Clinical Dietitian Pager # [phone] After hours/weekend pager # [phone]

## 2017-08-23 NOTE — ED Notes (Signed)
Respiratory therapist at patient bedside. Patient very anxious and complaining of worsening SOB. ED Provider Gerald Stabs at bedside.

## 2017-08-23 NOTE — ED Provider Notes (Signed)
[facility] DEPT Provider Note   CSN: [id number] Arrival date & time: 08/23/17  1478     History   Chief Complaint Chief Complaint  Patient presents with  . Respiratory Distress    HPI Julia Daniels is a 67 y.o. female.  HPI Patient presents to the emergency department with increasing shortness of breath that started last night but has had multiple episodes of the last month.  Patient states that she was seen by her doctor several times for what presumably was a COPD exacerbation in their office.  The patient states she was given an at home nebulized machine this past week.  She states last night she got very short of breath acutely and then gave herself a nebulizer treatment which seemed to help somewhat and she was able to go to work.  The patient states while she was at work she became more short of breath and started wheezing and felt more tight.  She states she gave herself 2 nebulizer treatments with minimal relief of her symptoms.  Patient drove herself to the emergency department for further evaluation.  Patient states that he has been on a round of steroids as well.  The patient denies chest pain,  headache,blurred vision, neck pain, fever, cough, weakness, numbness, dizziness, anorexia, edema, abdominal pain, nausea, vomiting, diarrhea, rash, back pain, dysuria, hematemesis, bloody stool, near syncope, or syncope. Past Medical History:  Diagnosis Date  . Anxiety   . COPD (chronic obstructive pulmonary disease) ([place])   . Depression     Patient Active Problem List   Diagnosis Date Noted  . ADD (attention deficit disorder) 08/30/2012  . Bipolar disorder, unspecified ([place]) 08/30/2012  . COPD (chronic obstructive pulmonary disease) ([place]) 08/30/2012  . Tobacco abuse 08/30/2012    Past Surgical History:  Procedure Laterality Date  . ABDOMINAL HYSTERECTOMY     partial      OB History   None      Home Medications    Prior to  Admission medications   Medication Sig Start Date End Date Taking? Authorizing Provider  albuterol (PROVENTIL HFA;VENTOLIN HFA) 108 (90 BASE) MCG/ACT inhaler Inhale 2 puffs into the lungs every 6 (six) hours as needed for wheezing or shortness of breath. 02/17/13   Dutch Quint B, FNP  amphetamine-dextroamphetamine (ADDERALL) 20 MG tablet Take 20 mg by mouth 2 (two) times daily.    [provider]  fluticasone (FLONASE) 50 MCG/ACT nasal spray Place 2 sprays into both nostrils daily. 12/17/12   Kennyth Arnold, FNP  ibuprofen (ADVIL,MOTRIN) 200 MG tablet Take 400 mg by mouth every 6 (six) hours as needed.    [provider]  lamoTRIgine (LAMICTAL) 200 MG tablet Take 200 mg by mouth daily.    [provider]  predniSONE (DELTASONE) 20 MG tablet 3 tablets daily for 3 days, then 2 tablets daily for 3 days,  then 1 tablet daily for 3 days 04/24/13   Zenaida Niece, PA-C  QUEtiapine (SEROQUEL) 25 MG tablet Take 50 mg by mouth at bedtime.    [provider]  risperidone (RISPERDAL) 4 MG tablet Take 4 mg by mouth at bedtime.    [provider]  SYMBICORT 80-4.5 MCG/ACT inhaler INHALE 2 PUFFS INTO LUNGS TWICE A DAY 07/03/13   Dutch Quint B, FNP  venlafaxine XR (EFFEXOR-XR) 150 MG 24 hr capsule Take 150 mg by mouth daily.    [provider]    Family History No family history on file.  Social History Social History   Tobacco Use  . Smoking status: Former Smoker    Packs/day: 0.75    Last attempt to quit: 11/08/2012    Years since quitting: 4.7  . Smokeless tobacco: Never Used  Substance Use Topics  . Alcohol use: No  . Drug use: No     Allergies   Sulfa antibiotics   Review of Systems Review of Systems  All other systems negative except as documented in the HPI. All pertinent positives and negatives as reviewed in the HPI.  Physical Exam Updated Vital Signs BP 130/85   Pulse (!) 123   Resp (!) 30   Ht 5\' 3"  (1.6 m)   Wt 78.9  kg   SpO2 93%   BMI 30.82 kg/m   Physical Exam  Constitutional: She is oriented to person, place, and time. She appears well-developed and well-nourished. No distress.  HENT:  Head: Normocephalic and atraumatic.  Mouth/Throat: Oropharynx is clear and moist.  Eyes: Pupils are equal, round, and reactive to light.  Neck: Normal range of motion. Neck supple.  Cardiovascular: Regular rhythm and normal heart sounds. Tachycardia present. Exam reveals no gallop and no friction rub.  No murmur heard. Pulmonary/Chest: Tachypnea noted. She is in respiratory distress. She has wheezes.  Abdominal: Soft. Bowel sounds are normal. She exhibits no distension. There is no tenderness.  Neurological: She is alert and oriented to person, place, and time. She exhibits normal muscle tone. Coordination normal.  Skin: Skin is warm and dry. Capillary refill takes less than 2 seconds. No rash noted. No erythema.  Psychiatric: She has a normal mood and affect. Her behavior is normal.  Nursing note and vitals reviewed.    ED Treatments / Results  Labs (all labs ordered are listed, but only abnormal results are displayed) Labs Reviewed  BASIC METABOLIC PANEL - Abnormal; Notable for the following components:      Result Value   Glucose, Bld 124 (*)    Calcium 10.6 (*)    All other components within normal limits  CBC WITH DIFFERENTIAL/PLATELET - Abnormal; Notable for the following components:   WBC 13.9 (*)    MCV 103.5 (*)    MCH 34.3 (*)    Platelets 407 (*)    Eosinophils Absolute 1.6 (*)    All other components within normal limits  BLOOD GAS, ARTERIAL - Abnormal; Notable for the following components:   pCO2 arterial 52.6 (*)    pO2, Arterial 81.5 (*)    Bicarbonate 28.3 (*)    Acid-Base Excess 2.3 (*)    All other components within normal limits    EKG EKG Interpretation  Date/Time:  Thursday August 23 2017 06:50:37 EDT Ventricular Rate:  132 PR Interval:    QRS Duration: 94 QT  Interval:  287 QTC Calculation: 426 R Axis:   77 Text Interpretation:  Sinus tachycardia Minimal ST elevation, lateral leads Artifact in lead(s) I II aVR aVL aVF V3 V4 V5 V6 No old tracing to compare Confirmed by Deno Etienne [phone]) on 08/23/2017 6:53:16 AM   Radiology Dg Chest Port 1 View  Result Date: 08/23/2017 CLINICAL DATA:  Shortness of breath.  COPD. EXAM: PORTABLE CHEST 1 VIEW COMPARISON:  August 30, 2012 FINDINGS: The heart, hila, and mediastinum are unremarkable given portable technique. No pneumothorax. No nodules or masses. No focal infiltrates. No overt edema. IMPRESSION: No active disease. Electronically Signed   By: Dorise Bullion III M.D   On: 08/23/2017 07:46    Procedures Procedures (  including critical care time)  Medications Ordered in ED Medications  albuterol (PROVENTIL,VENTOLIN) solution continuous neb (15 mg/hr Nebulization New Bag/Given 08/23/17 0721)  ipratropium (ATROVENT) nebulizer solution 0.5 mg (0.5 mg Nebulization Given 08/23/17 0713)  methylPREDNISolone sodium succinate (SOLU-MEDROL) 125 mg/2 mL injection 125 mg (125 mg Intravenous Given 08/23/17 0713)     Initial Impression / Assessment and Plan / ED Course  I have reviewed the triage vital signs and the nursing notes.  Pertinent labs & imaging results that were available during my care of the patient were reviewed by me and considered in my medical decision making (see chart for details).    CRITICAL CARE Performed by: Resa Miner Chyanne Kohut Total critical care time: 35  minutes Critical care time was exclusive of separately billable procedures and treating other patients. Critical care was necessary to treat or prevent imminent or life-threatening deterioration. Critical care was time spent personally by me on the following activities: development of treatment plan with patient and/or surrogate as well as nursing, discussions with consultants, evaluation of patient's response to treatment, examination of  patient, obtaining history from patient or surrogate, ordering and performing treatments and interventions, ordering and review of laboratory studies, ordering and review of radiographic studies, pulse oximetry and re-evaluation of patient's condition.   Patient required intervention for her respiratory distress as well as a nonrebreather mask to help maintain her oxygen saturation she has been given an hour-long nebulized treatment and will require admission to the hospital for COPD exacerbation.  I consulted with hospitalist service who will admit the patient for further evaluation.  Patient does feel somewhat better but still tight and wheezing on reexamination. Final Clinical Impressions(s) / ED Diagnoses   Final diagnoses:  None    ED Discharge Orders    None       Dalia Heading, PA-C 08/23/17 5830    Merrily Pew, MD 08/23/17 [phone]

## 2017-08-24 ENCOUNTER — Encounter (HOSPITAL_COMMUNITY): Payer: Self-pay

## 2017-08-24 ENCOUNTER — Inpatient Hospital Stay (HOSPITAL_COMMUNITY): Payer: BLUE CROSS/BLUE SHIELD

## 2017-08-24 DIAGNOSIS — F319 Bipolar disorder, unspecified: Secondary | ICD-10-CM

## 2017-08-24 DIAGNOSIS — J9601 Acute respiratory failure with hypoxia: Secondary | ICD-10-CM

## 2017-08-24 DIAGNOSIS — Z720 Tobacco use: Secondary | ICD-10-CM

## 2017-08-24 DIAGNOSIS — R0603 Acute respiratory distress: Secondary | ICD-10-CM

## 2017-08-24 DIAGNOSIS — J9602 Acute respiratory failure with hypercapnia: Secondary | ICD-10-CM

## 2017-08-24 DIAGNOSIS — E785 Hyperlipidemia, unspecified: Secondary | ICD-10-CM

## 2017-08-24 DIAGNOSIS — F419 Anxiety disorder, unspecified: Secondary | ICD-10-CM

## 2017-08-24 DIAGNOSIS — J441 Chronic obstructive pulmonary disease with (acute) exacerbation: Principal | ICD-10-CM

## 2017-08-24 LAB — COMPREHENSIVE METABOLIC PANEL
ALT: 18 U/L (ref 0–44)
AST: 29 U/L (ref 15–41)
Albumin: 3.7 g/dL (ref 3.5–5.0)
Alkaline Phosphatase: 49 U/L (ref 38–126)
Anion gap: 7 (ref 5–15)
BILIRUBIN TOTAL: 0.9 mg/dL (ref 0.3–1.2)
BUN: 12 mg/dL (ref 8–23)
CO2: 28 mmol/L (ref 22–32)
Calcium: 9.9 mg/dL (ref 8.9–10.3)
Chloride: 103 mmol/L (ref 98–111)
Creatinine, Ser: 0.66 mg/dL (ref 0.44–1.00)
GFR calc Af Amer: >60 mL/min (ref >60)
GLUCOSE: 136 mg/dL — AB (ref 70–99)
POTASSIUM: 5.3 mmol/L — AB (ref 3.5–5.1)
Sodium: 138 mmol/L (ref 135–145)
Total Protein: 6.4 g/dL — ABNORMAL LOW (ref 6.5–8.1)

## 2017-08-24 LAB — GLUCOSE, CAPILLARY: GLUCOSE-CAPILLARY: 110 mg/dL — AB (ref 70–99)

## 2017-08-24 LAB — HIV ANTIBODY (ROUTINE TESTING W REFLEX): HIV SCREEN 4TH GENERATION: NONREACTIVE

## 2017-08-24 MED ORDER — IPRATROPIUM-ALBUTEROL 0.5-2.5 (3) MG/3ML IN SOLN
3.0000 mL | Freq: Four times a day (QID) | RESPIRATORY_TRACT | Status: DC
  Administered 2017-08-24 – 2017-08-25 (×4): 3 mL via RESPIRATORY_TRACT
  Filled 2017-08-24 (×5): qty 3

## 2017-08-24 MED ORDER — AZITHROMYCIN 250 MG PO TABS
500.0000 mg | ORAL_TABLET | Freq: Every day | ORAL | Status: DC
  Administered 2017-08-25: 500 mg via ORAL
  Filled 2017-08-24: qty 2

## 2017-08-24 MED ORDER — METHYLPREDNISOLONE SODIUM SUCC 40 MG IJ SOLR
40.0000 mg | Freq: Two times a day (BID) | INTRAMUSCULAR | Status: DC
  Administered 2017-08-24 – 2017-08-25 (×2): 40 mg via INTRAVENOUS
  Filled 2017-08-24 (×2): qty 1

## 2017-08-24 NOTE — Progress Notes (Signed)
PROGRESS NOTE    Julia Daniels  TMH:[id number] DOB: 1950-03-04 DOA: 08/23/2017 PCP: [facility]    Brief Narrative:  67 year old female who presented with dyspnea.  She does have significant past medical history for COPD, ongoing tobacco abuse, anxiety, depression, bipolar and irritable bowel syndrome.  Reported 6 weeks of worsening dyspnea, associated with increasing sputum production, coughing, wheezing and congestion.  Her symptoms were worsening and were refractive to outpatient therapy with bronchodilator therapy and 2 visits at the local urgent care.  On her initial physical examination blood pressure 121/69, heart rate 130, respiratory rate 35, oxygen saturation 92% on supplemental oxygen.  Initial breath sounds bilaterally with positive expiratory wheezing coarse breath sounds, heart S1-S2 present, tachycardic, no gallops, rubs or murmurs, her abdomen was soft nontender, no lower extremity edema.  Sodium 138, potassium 4.6, chloride 100, bicarb 24, glucose 124, BUN 14, creatinine 0.88, white cell count 13.9, hemoglobin 14.7, hematocrit 44.4, platelets 407.  Arterial blood gas pH 7.35, PCO2 52.6, PO2 81.5, bicarb 28.3, oxygen saturation 95.9.  Chest x-ray with bibasilar atelectasis.  EKG sinus tachycardia, 132 bpm, normal axis, normal intervals, positive artifact.  Patient was admitted to the hospital with working diagnosis of acute COPD exacerbation complicated by acute hypercapnic respiratory failure.   Assessment & Plan:   Active Problems:   ADD (attention deficit disorder)   Bipolar disorder, unspecified (H[facility])   COPD (chronic obstructive pulmonary disease) ([facility])   Tobacco abuse   Hyperlipidemia   Depression   Anxiety   Hypercalcemia   Thrombocytosis ([facility])   COPD with acute exacerbation ([facility])   Acute respiratory failure with hypoxia and hypercapnia ([facility])   1. Acute COPD exacerbation with acute hypercapnic respiratory failure. Will continue aggressive  bronchodilator therapy with duoneb scheduled and as needed, will decrease systemic steroids to 40 mg of methylprednisolone bid, continue oxymetry monitoring and supplemental 02 per [place] to target 02 saturation greater than 92%. Continue as needed albuterol, continue fluticasone and vilanterol qhs. Out of bed as tolerated. Will change doxy for azithromycin.   2. Tobacco abuse. Will continue nicotine replacement, smoking cessation.   3. Depression and anxiety. Continue alprazolam, dexmethylphenidate, lamotrigine, risperidone, no agitation or confusion.   4. Steroid induced hyperglycemia. Will continue glucose monitoring before meals and will decrease dose of steroids. Follow on hb A1c. Fasting glucose 136 mg/dl   DVT prophylaxis: enoxaparin   Code Status:  full Family Communication: no family at the beside  Disposition Plan/ discharge barriers:  Pending clinical improvement    Consultants:      Procedures:     Antimicrobials:       Subjective: Dyspnea continue to improve but not back to baseline, at home with severe dyspnea, cough and congestion, refractive to bronchodilator therapy. No nausea or vomiting, continue to smoke at home 3/4 pack daily.   Objective: Vitals:   08/24/17 0126 08/24/17 0612 08/24/17 0844 08/24/17 0845  BP:  126/84    Pulse:  92    Resp:  18    Temp:  98 F (36.7 C)    TempSrc:  Oral    SpO2: 95% 92% (!) 89% 94%  Weight:      Height:        Intake/Output Summary (Last 24 hours) at 08/24/2017 0958 Last data filed at 08/24/2017 0200 Gross per 24 hour  Intake 1380.59 ml  Output 300 ml  Net 1080.59 ml   Filed Weights   08/23/17 0751 08/23/17 1103  Weight: 78.9 kg 81.2 kg  Examination:   General: deconditioned  Neurology: Awake and alert, non focal  E ENT: mild pallor, no icterus, oral mucosa moist Cardiovascular: No JVD. S1-S2 present, rhythmic, no gallops, rubs, or murmurs. No lower extremity edema. Pulmonary: decreased breath sounds  bilaterally, poor air movement, positive wheezing, but no rhonchi or rales. Gastrointestinal. Abdomen with no organomegaly, non tender, no rebound or guarding Skin. No rashes Musculoskeletal: no joint deformities     Data Reviewed: I have personally reviewed following labs and imaging studies  CBC: Recent Labs  Lab 08/23/17 0702 08/23/17 1206  WBC 13.9* 11.3*  NEUTROABS 7.6  --   HGB 14.7 13.6  HCT 44.4 41.3  MCV 103.5* 102.7*  PLT 407* 035   Basic Metabolic Panel: Recent Labs  Lab 08/23/17 0702 08/23/17 1206 08/24/17 0545  NA 138  --  138  K 4.6  --  5.3*  CL 100  --  103  CO2 24  --  28  GLUCOSE 124*  --  136*  BUN 14  --  12  CREATININE 0.88 0.84 0.66  CALCIUM 10.6*  --  9.9  MG  --  2.1  --   PHOS  --  3.6  --    GFR: Estimated Creatinine Clearance: 71.7 mL/min (by C-G formula based on SCr of 0.66 mg/dL). Liver Function Tests: Recent Labs  Lab 08/24/17 0545  AST 29  ALT 18  ALKPHOS 49  BILITOT 0.9  PROT 6.4*  ALBUMIN 3.7   No results for input(s): LIPASE, AMYLASE in the last 168 hours. No results for input(s): AMMONIA in the last 168 hours. Coagulation Profile: No results for input(s): INR, PROTIME in the last 168 hours. Cardiac Enzymes: No results for input(s): CKTOTAL, CKMB, CKMBINDEX, TROPONINI in the last 168 hours. BNP (last 3 results) No results for input(s): PROBNP in the last 8760 hours. HbA1C: No results for input(s): HGBA1C in the last 72 hours. CBG: No results for input(s): GLUCAP in the last 168 hours. Lipid Profile: No results for input(s): CHOL, HDL, LDLCALC, TRIG, CHOLHDL, LDLDIRECT in the last 72 hours. Thyroid Function Tests: Recent Labs    08/23/17 1206  TSH 0.629   Anemia Panel: No results for input(s): VITAMINB12, FOLATE, FERRITIN, TIBC, IRON, RETICCTPCT in the last 72 hours.    Radiology Studies: I have reviewed all of the imaging during this hospital visit personally     Scheduled Meds: . atorvastatin  20 mg  Oral QHS  . dexmethylphenidate  40 mg Oral Daily  . doxycycline  100 mg Oral Q12H  . enoxaparin (LOVENOX) injection  40 mg Subcutaneous Q24H  . fluticasone furoate-vilanterol  1 puff Inhalation QHS  . guaiFENesin  1,200 mg Oral BID  . ipratropium  0.5 mg Nebulization Q6H  . lamoTRIgine  200 mg Oral QHS  . levalbuterol  0.63 mg Nebulization Q6H  . methylPREDNISolone (SOLU-MEDROL) injection  60 mg Intravenous Q6H  . nicotine  14 mg Transdermal Daily  . pantoprazole  40 mg Oral Daily  . QUEtiapine  50 mg Oral QHS  . risperiDONE  3 mg Oral QHS  . venlafaxine XR  150 mg Oral QHS   Continuous Infusions:   LOS: 1 day        Kersti Scavone Gerome Apley, MD [facility] Pager [phone]

## 2017-08-24 NOTE — Evaluation (Signed)
Occupational Therapy Evaluation Patient Details Name: KEONNA RAETHER MRN: [id number] DOB: [birth year] Today's Date: 08/24/2017    History of Present Illness 67 yo female admitted with COPD exac. Hx of COPD, bipolar d/o, ADD, anxiety, depression   Clinical Impression   Pt was admitted for the above.  All education was completed. No further OT is needed at this time    Follow Up Recommendations  No OT follow up    Equipment Recommendations  None recommended by OT    Recommendations for Other Services       Precautions / Restrictions Precautions Precaution Comments: monitor O2      Mobility Bed Mobility                  Transfers                      Balance                                           ADL either performed or assessed with clinical judgement   ADL                                         General ADL Comments: Pt needs set up/supervision for adls. Reviewed energy conservation for ADLs, IADLs and work and gave her handouts.  Educated on pursed lip breathing when dyspneic     Vision         Perception     Praxis      Pertinent Vitals/Pain Pain Assessment: No/denies pain     Hand Dominance     Extremity/Trunk Assessment Upper Extremity Assessment Upper Extremity Assessment: Overall WFL for tasks assessed           Communication     Cognition Arousal/Alertness: Awake/alert Behavior During Therapy: WFL for tasks assessed/performed Overall Cognitive Status: Within Functional Limits for tasks assessed                                     General Comments       Exercises     Shoulder Instructions      Home Living    lives with son.  Has tub shower and standard commode. Son does a lot of housework and some cooking.  Pt orders out a lot. 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                                      Prior Functioning/Environment                    OT Problem List:        OT Treatment/Interventions:      OT Goals(Current goals can be found in the care plan section) Acute Rehab OT Goals Patient Stated Goal: to breathe better  OT Frequency:     Barriers to D/C:            Co-evaluation              AM-PAC PT "6 Clicks" Daily Activity     Outcome Measure Help from  another person eating meals?: None Help from another person taking care of personal grooming?: A Little Help from another person toileting, which includes using toliet, bedpan, or urinal?: A Little Help from another person bathing (including washing, rinsing, drying)?: A Little Help from another person to put on and taking off regular upper body clothing?: A Little Help from another person to put on and taking off regular lower body clothing?: A Little 6 Click Score: 19   End of Session    Activity Tolerance: Patient tolerated treatment well Patient left: in bed;with call bell/phone within reach  OT Visit Diagnosis: Muscle weakness (generalized) (M62.81)                Time: 2111-7356 OT Time Calculation (min): 18 min Charges:  OT General Charges $OT Visit: 1 Visit OT Evaluation $OT Eval Low Complexity: 1 Low  Lesle Chris, OTR/L [phone] 08/24/2017  Dev Dhondt 08/24/2017, 2:26 PM

## 2017-08-24 NOTE — Evaluation (Signed)
Physical Therapy Evaluation-1x Patient Details Name: Julia Daniels MRN: [id number] DOB: 1950/08/06 Today's Date: 08/24/2017     SATURATION QUALIFICATIONS: (This note is used to comply with regulatory documentation for home oxygen)  Patient Saturations on Room Air at Rest = 93%  Patient Saturations on Room Air while Ambulating = 88-89%      History of Present Illness  67 yo female admitted with COPD exac. Hx of COPD, bipolar d/o, ADD, anxiety, depression  Clinical Impression  On eval, pt was Supervision-Mod ind level for mobility. She walked ~150 feet without an assistive device. Mildly unsteady especially when she becomes dyspneic. No physical assist given during session. No f/u PT needs. Will sign off.     Follow Up Recommendations No PT follow up    Equipment Recommendations  None recommended by PT    Recommendations for Other Services       Precautions / Restrictions Precautions Precautions: Other (comment) Precaution Comments: monitor O2 Restrictions Weight Bearing Restrictions: No      Mobility  Bed Mobility Overal bed mobility: Modified Independent                Transfers Overall transfer level: Modified independent                  Ambulation/Gait Ambulation/Gait assistance: Supervision Gait Distance (Feet): 150 Feet Assistive device: None Gait Pattern/deviations: Step-through pattern;Drifts right/left     General Gait Details: mildly unsteady but no LOB. Dyspnea 2/4. Cues for pursed lip/deep breathing  Stairs            Wheelchair Mobility    Modified Rankin (Stroke Patients Only)       Balance Overall balance assessment: Mild deficits observed, not formally tested                                           Pertinent Vitals/Pain Pain Assessment: No/denies pain    Home Living Family/patient expects to be discharged to:: Private residence Living Arrangements: Children(son)   Type of Home:  House Home Access: Stairs to enter Entrance Stairs-Rails: Right Entrance Stairs-Number of Steps: a few  Home Layout: One level Home Equipment: None      Prior Function Level of Independence: Independent               Hand Dominance        Extremity/Trunk Assessment   Upper Extremity Assessment Upper Extremity Assessment: Defer to OT evaluation    Lower Extremity Assessment Lower Extremity Assessment: Overall WFL for tasks assessed    Cervical / Trunk Assessment Cervical / Trunk Assessment: Normal  Communication   Communication: No difficulties  Cognition Arousal/Alertness: Awake/alert Behavior During Therapy: WFL for tasks assessed/performed Overall Cognitive Status: Within Functional Limits for tasks assessed                                        General Comments      Exercises     Assessment/Plan    PT Assessment Patent does not need any further PT services  PT Problem List         PT Treatment Interventions      PT Goals (Current goals can be found in the Care Plan section)  Acute Rehab PT Goals Patient Stated Goal: to breathe better PT  Goal Formulation: All assessment and education complete, DC therapy    Frequency     Barriers to discharge        Co-evaluation               AM-PAC PT "6 Clicks" Daily Activity  Outcome Measure Difficulty turning over in bed (including adjusting bedclothes, sheets and blankets)?: None Difficulty moving from lying on back to sitting on the side of the bed? : None Difficulty sitting down on and standing up from a chair with arms (e.g., wheelchair, bedside commode, etc,.)?: None Help needed moving to and from a bed to chair (including a wheelchair)?: None Help needed walking in hospital room?: A Little Help needed climbing 3-5 steps with a railing? : A Little 6 Click Score: 22    End of Session   Activity Tolerance: Patient tolerated treatment well Patient left: in bed;with call  bell/phone within reach        Time: 5697-9480 PT Time Calculation (min) (ACUTE ONLY): 14 min   Charges:   PT Evaluation $PT Eval Moderate Complexity: 1 Mod          Weston Anna, MPT Pager: [phone]

## 2017-08-25 ENCOUNTER — Encounter (HOSPITAL_COMMUNITY): Payer: Self-pay

## 2017-08-25 LAB — GLUCOSE, CAPILLARY: GLUCOSE-CAPILLARY: 100 mg/dL — AB (ref 70–99)

## 2017-08-25 LAB — BASIC METABOLIC PANEL
Anion gap: 6 (ref 5–15)
BUN: 16 mg/dL (ref 8–23)
CO2: 28 mmol/L (ref 22–32)
CREATININE: 0.6 mg/dL (ref 0.44–1.00)
Calcium: 9.7 mg/dL (ref 8.9–10.3)
Chloride: 103 mmol/L (ref 98–111)
GFR calc Af Amer: >60 mL/min (ref >60)
GLUCOSE: 102 mg/dL — AB (ref 70–99)
Potassium: 4.2 mmol/L (ref 3.5–5.1)
SODIUM: 137 mmol/L (ref 135–145)

## 2017-08-25 MED ORDER — NICOTINE 14 MG/24HR TD PT24: 14.0000 mg | MEDICATED_PATCH | Freq: Every day | TRANSDERMAL | 0 refills | Status: DC

## 2017-08-25 MED ORDER — PREDNISONE 20 MG PO TABS: 40.0000 mg | ORAL_TABLET | Freq: Every day | ORAL | Status: AC

## 2017-08-25 MED ORDER — AZITHROMYCIN 500 MG PO TABS: 500.0000 mg | ORAL_TABLET | Freq: Every day | ORAL | 0 refills | Status: AC

## 2017-08-25 NOTE — Discharge Summary (Signed)
Physician Discharge Summary  Julia Daniels YWV:[id number] DOB: 07/13/1950 DOA: 08/23/2017  PCP: [facility]  Admit date: 08/23/2017 Discharge date: 08/25/2017  Admitted From: Home  Disposition:  Home   Recommendations for Outpatient Follow-up and new medication changes:  1. Follow up with [facility] in 7 days 2. Patient will take prednisone 40 mg daily for 5 days and Azithromycin 500 mg daily for 5 days.  3. Started on nicotine replacement therapy, 14 mg patch, for smoking cessation.  4. Continue bronchodilators and follow up with the pulmonary clinic.   Home Health: no   Equipment/Devices: no    Discharge Condition: stable  CODE STATUS: full  Diet recommendation: Regular diet.   Brief/Interim Summary: 67 year old female who presented with dyspnea.  She does have significant past medical history for COPD, ongoing tobacco abuse, anxiety, depression, bipolar and irritable bowel syndrome.  Reported 6 weeks of worsening dyspnea, associated with increasing sputum production, coughing, wheezing and congestion.  Her symptoms were worsening and were refractive to outpatient therapy with bronchodilator therapy and 2 visits at the local urgent care.  On her initial physical examination blood pressure 121/69, heart rate 130, respiratory rate 35, oxygen saturation 92% on supplemental oxygen.  Breath sounds bilaterally with positive expiratory wheezing and coarse breath sounds, heart S1-S2 present, tachycardic, no gallops, rubs or murmurs, her abdomen was soft nontender, no lower extremity edema.  Sodium 138, potassium 4.6, chloride 100, bicarb 24, glucose 124, BUN 14, creatinine 0.88, white cell count 13.9, hemoglobin 14.7, hematocrit 44.4, platelets 407.  Arterial blood gas pH 7.35, PCO2 52.6, PO2 81.5, bicarb 28.3, oxygen saturation 95.9.  Chest x-ray with bibasilar atelectasis.  EKG sinus tachycardia, 132 bpm, normal axis, normal intervals, positive artifact.  Patient  was admitted to the hospital with working diagnosis of acute COPD exacerbation complicated by acute hypercapnic respiratory failure.  1.  Acute COPD exacerbation with acute hypercapnic respiratory failure.  Patient was admitted to the medical ward, she was placed on a remote telemetry monitor, oximetry monitoring and supplemental oxygen per nasal cannula.  She received IV systemic steroids with methylprednisolone, antibiotic therapy with azithromycin and aggressive bronchodilator therapy.  She responded well with improvement of her symptoms, deemed stable to be discharged August 17.  Continue prednisone 40 mg daily for the next 5 days along with azithromycin.  Continue with Breo and albuterol at home.  Her discharge oxygen saturation was 93% to 97% on room air.   2.  Tobacco abuse.  Patient was placed on nicotine replacement therapy per nicotine patch 14 mg daily, smoking cessation counseling.  Patient will be discharged on a nicotine patch, follow-up as an outpatient.  3.  Depression/anxiety.  No agitation or confusion, continue alprazolam, dexmethylphenidate, lamotrigine, and risperidone.  4.  Steroid-induced hyperglycemia.  Peak glucose 136, steroids have been taper, close follow-up as an outpatient.   Discharge Diagnoses:  Active Problems:   ADD (attention deficit disorder)   Bipolar disorder, unspecified (HCC)   COPD (chronic obstructive pulmonary disease) (HCC)   Tobacco abuse   Hyperlipidemia   Depression   Anxiety   Hypercalcemia   Thrombocytosis (HCC)   COPD with acute exacerbation (HCC)   Acute respiratory failure with hypoxia and hypercapnia ([place])    Discharge Instructions   Allergies as of 08/25/2017      Reactions   Sulfa Antibiotics       Medication List    STOP taking these medications   meloxicam 15 MG tablet Commonly known as:  MOBIC  predniSONE 20 MG tablet Commonly known as:  DELTASONE   SYMBICORT 80-4.5 MCG/ACT inhaler Generic drug:   budesonide-formoterol     TAKE these medications   albuterol 108 (90 Base) MCG/ACT inhaler Commonly known as:  PROVENTIL HFA;VENTOLIN HFA Inhale 2 puffs into the lungs every 6 (six) hours as needed for wheezing or shortness of breath.   albuterol (2.5 MG/3ML) 0.083% nebulizer solution Commonly known as:  PROVENTIL Take 3 mLs by nebulization 3 (three) times daily as needed for wheezing or shortness of breath.   ALPRAZolam 0.25 MG tablet Commonly known as:  XANAX Take 0.25 mg by mouth daily as needed.   atorvastatin 20 MG tablet Commonly known as:  LIPITOR Take 20 mg by mouth at bedtime.   azithromycin 500 MG tablet Commonly known as:  ZITHROMAX Take 1 tablet (500 mg total) by mouth daily for 3 days. Take 1 tablet daily for 3 days.   BREO ELLIPTA 100-25 MCG/INH Aepb Generic drug:  fluticasone furoate-vilanterol Take 1 puff by mouth at bedtime.   Dexmethylphenidate HCl 40 MG Cp24 Take 40 mg by mouth at bedtime.   ibuprofen 200 MG tablet Commonly known as:  ADVIL,MOTRIN Take 400 mg by mouth every 6 (six) hours as needed for mild pain.   lamoTRIgine 200 MG tablet Commonly known as:  LAMICTAL Take 200 mg by mouth at bedtime.   magic mouthwash w/lidocaine Soln Take 5 mLs by mouth 3 (three) times daily.   nicotine 14 mg/24hr patch Commonly known as:  NICODERM CQ - dosed in mg/24 hours Place 1 patch (14 mg total) onto the skin daily. Start taking on:  08/26/2017   PEPTO-BISMOL 262 MG Tabs Generic drug:  Bismuth Subsalicylate Take 2 tablets by mouth daily as needed (upset stomach).   QUEtiapine 25 MG tablet Commonly known as:  SEROQUEL Take 50 mg by mouth at bedtime.   risperiDONE 1 MG tablet Commonly known as:  RISPERDAL Take 3 mg by mouth at bedtime.   venlafaxine XR 150 MG 24 hr capsule Commonly known as:  EFFEXOR-XR Take 150 mg by mouth at bedtime.   Vitamin D (Ergocalciferol) 50000 units Caps capsule Commonly known as:  DRISDOL Take 50,000 Units by mouth  once a week. On Saturdays       Allergies  Allergen Reactions  . Sulfa Antibiotics     Consultations:     Procedures/Studies: Dg Chest 2 View  Result Date: 08/24/2017 CLINICAL DATA:  Shortness of breath EXAM: CHEST - 2 VIEW COMPARISON:  08/23/2017 FINDINGS: The lungs are hyperinflated likely secondary to COPD. There is mild bibasilar atelectasis. There is no focal consolidation. There is no pleural effusion or pneumothorax. The heart and mediastinal contours are unremarkable. The osseous structures are unremarkable. IMPRESSION: No active cardiopulmonary disease. Electronically Signed   By: Kathreen Devoid   On: 08/24/2017 09:34   Ct Angio Chest Pe W/cm &/or Wo Cm  Result Date: 08/23/2017 CLINICAL DATA:  Shortness of breath over the last month which is worsening. EXAM: CT ANGIOGRAPHY CHEST WITH CONTRAST TECHNIQUE: Multidetector CT imaging of the chest was performed using the standard protocol during bolus administration of intravenous contrast. Multiplanar CT image reconstructions and MIPs were obtained to evaluate the vascular anatomy. CONTRAST:  73mL ISOVUE-370 IOPAMIDOL (ISOVUE-370) INJECTION 76% COMPARISON:  Radiography same day FINDINGS: Cardiovascular: Pulmonary arterial opacification is good. There are no pulmonary emboli. Heart size is normal. The patient does have some coronary artery calcification. There is minimal aortic atherosclerotic calcification. Mediastinum/Nodes: No mass or lymphadenopathy. Lungs/Pleura: No  pleural effusion. No underlying emphysematous blebs or bullae. Minimal atelectasis or scar at both lung bases. No sign of infiltrate or lobar collapse. No focal mass or nodule. Upper Abdomen: Negative Musculoskeletal: Chronic thoracic degenerative changes. Review of the MIP images confirms the above findings. IMPRESSION: No pulmonary emboli or other acute chest pathology. Mild basilar atelectasis and or scarring. Coronary artery calcification. Minimal aortic atherosclerotic  calcification. Electronically Signed   By: Nelson Chimes M.D.   On: 08/23/2017 10:42   Dg Chest Port 1 View  Result Date: 08/23/2017 CLINICAL DATA:  Shortness of breath.  COPD. EXAM: PORTABLE CHEST 1 VIEW COMPARISON:  August 30, 2012 FINDINGS: The heart, hila, and mediastinum are unremarkable given portable technique. No pneumothorax. No nodules or masses. No focal infiltrates. No overt edema. IMPRESSION: No active disease. Electronically Signed   By: Dorise Bullion III M.D   On: 08/23/2017 07:46       Subjective: Patient is feeling well, close to her baseline, wheezing has decreased, no nausea or vomiting. No chest pain.   Discharge Exam: Vitals:   08/25/17 0534 08/25/17 0815  BP: (!) 146/93   Pulse: 91   Resp: 16   Temp: 98.6 F (37 C)   SpO2: 97% 93%   Vitals:   08/24/17 2045 08/25/17 0209 08/25/17 0534 08/25/17 0815  BP: 135/89  (!) 146/93   Pulse: 85  91   Resp: 16  16   Temp: 98.6 F (37 C)  98.6 F (37 C)   TempSrc: Oral  Oral   SpO2: 92% 92% 97% 93%  Weight:      Height:        General: Not in pain or dyspnea  Neurology: Awake and alert, non focal  E ENT: no pallor, no icterus, oral mucosa moist Cardiovascular: No JVD. S1-S2 present, rhythmic, no gallops, rubs, or murmurs. No lower extremity edema. Pulmonary: vesicular breath sounds bilaterally, adequate air movement, positive mild expieratory wheezing, no rhonchi or rales. Gastrointestinal. Abdomen no organomegaly, non tender, no rebound or guarding Skin. No rashes Musculoskeletal: no joint deformities   The results of significant diagnostics from this hospitalization (including imaging, microbiology, ancillary and laboratory) are listed below for reference.     Microbiology: Recent Results (from the past 240 hour(s))  Respiratory Panel by PCR     Status: None   Collection Time: 08/23/17 11:03 AM  Result Value Ref Range Status   Adenovirus NOT DETECTED NOT DETECTED Final   Coronavirus 229E NOT DETECTED  NOT DETECTED Final   Coronavirus HKU1 NOT DETECTED NOT DETECTED Final   Coronavirus NL63 NOT DETECTED NOT DETECTED Final   Coronavirus OC43 NOT DETECTED NOT DETECTED Final   Metapneumovirus NOT DETECTED NOT DETECTED Final   Rhinovirus / Enterovirus NOT DETECTED NOT DETECTED Final   Influenza A NOT DETECTED NOT DETECTED Final   Influenza B NOT DETECTED NOT DETECTED Final   Parainfluenza Virus 1 NOT DETECTED NOT DETECTED Final   Parainfluenza Virus 2 NOT DETECTED NOT DETECTED Final   Parainfluenza Virus 3 NOT DETECTED NOT DETECTED Final   Parainfluenza Virus 4 NOT DETECTED NOT DETECTED Final   Respiratory Syncytial Virus NOT DETECTED NOT DETECTED Final   Bordetella pertussis NOT DETECTED NOT DETECTED Final   Chlamydophila pneumoniae NOT DETECTED NOT DETECTED Final   Mycoplasma pneumoniae NOT DETECTED NOT DETECTED Final  Culture, blood (routine x 2) Call MD if unable to obtain prior to antibiotics being given     Status: None (Preliminary result)   Collection Time: 08/23/17  12:06 PM  Result Value Ref Range Status   Specimen Description   Final    BLOOD LEFT ANTECUBITAL Performed at [facility] [address], [place], [place] [address]    Special Requests   Final    BOTTLES DRAWN AEROBIC AND ANAEROBIC Blood Culture results may not be optimal due to an inadequate volume of blood received in culture bottles Performed at [facility] [address] [place], [place] [address]    Culture   Final    NO GROWTH < 24 HOURS Performed at [facility] [address] [place], [place] [address]    Report Status PENDING  Incomplete  Culture, blood (routine x 2) Call MD if unable to obtain prior to antibiotics being given     Status: None (Preliminary result)   Collection Time: 08/23/17 12:06 PM  Result Value Ref Range Status   Specimen Description   Final    BLOOD BLOOD LEFT FOREARM Performed at [facility] [address], [place], [place] [address]    Special Requests   Final    BOTTLES DRAWN AEROBIC AND ANAEROBIC Blood Culture adequate volume Performed at [facility] [address] [place], [place] [address]    Culture   Final    NO GROWTH < 24 HOURS Performed at [facility] [address] [place], [place] [address]    Report Status PENDING  Incomplete     Labs: BNP (last 3 results) Recent Labs    08/23/17 1206  BNP 99.3   Basic Metabolic Panel: Recent Labs  Lab 08/23/17 0702 08/23/17 1206 08/24/17 0545 08/25/17 0510  NA 138  --  138 137  K 4.6  --  5.3* 4.2  CL 100  --  103 103  CO2 24  --  28 28  GLUCOSE 124*  --  136* 102*  BUN 14  --  12 16  CREATININE 0.88 0.84 0.66 0.60  CALCIUM 10.6*  --  9.9 9.7  MG  --  2.1  --   --   PHOS  --  3.6  --   --    Liver Function Tests: Recent Labs  Lab 08/24/17 0545  AST 29  ALT 18  ALKPHOS 49  BILITOT 0.9  PROT 6.4*  ALBUMIN 3.7   No results for input(s): LIPASE, AMYLASE in the last 168 hours. No results for input(s): AMMONIA in the last 168 hours. CBC: Recent Labs  Lab 08/23/17 0702 08/23/17 1206  WBC 13.9* 11.3*  NEUTROABS 7.6  --   HGB 14.7 13.6  HCT 44.4 41.3  MCV 103.5* 102.7*  PLT 407* 354   Cardiac Enzymes: No results for input(s): CKTOTAL, CKMB, CKMBINDEX, TROPONINI in the last 168 hours. BNP: Invalid input(s): POCBNP CBG: Recent Labs  Lab 08/24/17 1719 08/25/17 0747  GLUCAP 110* 100*   D-Dimer No results for input(s): DDIMER in the last 72 hours. Hgb A1c No results for input(s): HGBA1C in the last 72 hours. Lipid Profile No results for input(s): CHOL, HDL, LDLCALC, TRIG, CHOLHDL, LDLDIRECT in the last 72 hours. Thyroid function studies Recent Labs    08/23/17 1206  TSH 0.629   Anemia work up No results for input(s): VITAMINB12, FOLATE, FERRITIN, TIBC, IRON, RETICCTPCT in the last 72 hours. Urinalysis    Component Value Date/Time   BILIRUBINUR n 09/23/2012 1111    PROTEINUR n 09/23/2012 1111   UROBILINOGEN 0.2 09/23/2012 1111   NITRITE n  09/23/2012 1111   LEUKOCYTESUR Negative 09/23/2012 1111   Sepsis Labs Invalid input(s): PROCALCITONIN,  WBC,  LACTICIDVEN Microbiology Recent Results (from the past 240 hour(s))  Respiratory Panel by PCR     Status: None   Collection Time: 08/23/17 11:03 AM  Result Value Ref Range Status   Adenovirus NOT DETECTED NOT DETECTED Final   Coronavirus 229E NOT DETECTED NOT DETECTED Final   Coronavirus HKU1 NOT DETECTED NOT DETECTED Final   Coronavirus NL63 NOT DETECTED NOT DETECTED Final   Coronavirus OC43 NOT DETECTED NOT DETECTED Final   Metapneumovirus NOT DETECTED NOT DETECTED Final   Rhinovirus / Enterovirus NOT DETECTED NOT DETECTED Final   Influenza A NOT DETECTED NOT DETECTED Final   Influenza B NOT DETECTED NOT DETECTED Final   Parainfluenza Virus 1 NOT DETECTED NOT DETECTED Final   Parainfluenza Virus 2 NOT DETECTED NOT DETECTED Final   Parainfluenza Virus 3 NOT DETECTED NOT DETECTED Final   Parainfluenza Virus 4 NOT DETECTED NOT DETECTED Final   Respiratory Syncytial Virus NOT DETECTED NOT DETECTED Final   Bordetella pertussis NOT DETECTED NOT DETECTED Final   Chlamydophila pneumoniae NOT DETECTED NOT DETECTED Final   Mycoplasma pneumoniae NOT DETECTED NOT DETECTED Final  Culture, blood (routine x 2) Call MD if unable to obtain prior to antibiotics being given     Status: None (Preliminary result)   Collection Time: 08/23/17 12:06 PM  Result Value Ref Range Status   Specimen Description   Final    BLOOD LEFT ANTECUBITAL Performed at [facility], [place] [address], [place], [place] [address]    Special Requests   Final    BOTTLES DRAWN AEROBIC AND ANAEROBIC Blood Culture results may not be optimal due to an inadequate volume of blood received in culture bottles Performed at [facility] [address] [place], [place] [address]    Culture   Final    NO GROWTH <  24 HOURS Performed at [facility] [address], [place], [place] [address]    Report Status PENDING  Incomplete  Culture, blood (routine x 2) Call MD if unable to obtain prior to antibiotics being given     Status: None (Preliminary result)   Collection Time: 08/23/17 12:06 PM  Result Value Ref Range Status   Specimen Description   Final    BLOOD BLOOD LEFT FOREARM Performed at [facility] [address] [place], [place] [address]    Special Requests   Final    BOTTLES DRAWN AEROBIC AND ANAEROBIC Blood Culture adequate volume Performed at [facility] [address], [place], [place] [address]    Culture   Final    NO GROWTH < 24 HOURS Performed at [facility] [address], [place], [place] [address]    Report Status PENDING  Incomplete     Time coordinating discharge: 45 minutes  SIGNED:   Tawni Millers, MD  [facility] 08/25/2017, 10:34 AM Pager [phone]  If 7PM-7AM, please contact night-coverage [email or web address] Password [email or web address]

## 2017-08-28 LAB — CULTURE, BLOOD (ROUTINE X 2)
CULTURE: NO GROWTH
CULTURE: NO GROWTH
SPECIAL REQUESTS: ADEQUATE

## 2017-09-17 ENCOUNTER — Other Ambulatory Visit: Payer: Self-pay

## 2017-09-17 ENCOUNTER — Encounter (HOSPITAL_COMMUNITY): Payer: Self-pay

## 2017-09-17 ENCOUNTER — Emergency Department (HOSPITAL_COMMUNITY): Payer: BLUE CROSS/BLUE SHIELD

## 2017-09-17 ENCOUNTER — Inpatient Hospital Stay (HOSPITAL_COMMUNITY)
Admission: AD | Admit: 2017-09-17 | Discharge: 2017-09-19 | DRG: 190 | Disposition: A | Discharge status: Home / Self Care | Payer: BLUE CROSS/BLUE SHIELD | Attending: Internal Medicine | Admitting: Internal Medicine

## 2017-09-17 DIAGNOSIS — F319 Bipolar disorder, unspecified: Secondary | ICD-10-CM | POA: Diagnosis not present

## 2017-09-17 DIAGNOSIS — F329 Major depressive disorder, single episode, unspecified: Secondary | ICD-10-CM

## 2017-09-17 DIAGNOSIS — R0602 Shortness of breath: Secondary | ICD-10-CM

## 2017-09-17 DIAGNOSIS — J440 Chronic obstructive pulmonary disease with acute lower respiratory infection: Secondary | ICD-10-CM | POA: Diagnosis present

## 2017-09-17 DIAGNOSIS — F419 Anxiety disorder, unspecified: Secondary | ICD-10-CM | POA: Diagnosis not present

## 2017-09-17 DIAGNOSIS — J9601 Acute respiratory failure with hypoxia: Secondary | ICD-10-CM | POA: Diagnosis present

## 2017-09-17 DIAGNOSIS — J441 Chronic obstructive pulmonary disease with (acute) exacerbation: Principal | ICD-10-CM | POA: Diagnosis present

## 2017-09-17 DIAGNOSIS — Z87891 Personal history of nicotine dependence: Secondary | ICD-10-CM

## 2017-09-17 DIAGNOSIS — E785 Hyperlipidemia, unspecified: Secondary | ICD-10-CM | POA: Diagnosis present

## 2017-09-17 DIAGNOSIS — Z7951 Long term (current) use of inhaled steroids: Secondary | ICD-10-CM

## 2017-09-17 DIAGNOSIS — R0902 Hypoxemia: Secondary | ICD-10-CM

## 2017-09-17 DIAGNOSIS — F32A Depression, unspecified: Secondary | ICD-10-CM | POA: Diagnosis present

## 2017-09-17 DIAGNOSIS — J159 Unspecified bacterial pneumonia: Secondary | ICD-10-CM | POA: Diagnosis present

## 2017-09-17 LAB — CBC
HCT: 41.3 % (ref 36.0–46.0)
Hemoglobin: 13.6 g/dL (ref 12.0–15.0)
MCH: 33.9 pg (ref 26.0–34.0)
MCHC: 32.9 g/dL (ref 30.0–36.0)
MCV: 103 fL — ABNORMAL HIGH (ref 78.0–100.0)
Platelets: 349 10*3/uL (ref 150–400)
RBC: 4.01 MIL/uL (ref 3.87–5.11)
RDW: 13.7 % (ref 11.5–15.5)
WBC: 9.8 10*3/uL (ref 4.0–10.5)

## 2017-09-17 LAB — BASIC METABOLIC PANEL WITH GFR
Anion gap: 12 (ref 5–15)
BUN: 10 mg/dL (ref 8–23)
CO2: 24 mmol/L (ref 22–32)
Calcium: 10.3 mg/dL (ref 8.9–10.3)
Chloride: 103 mmol/L (ref 98–111)
Creatinine, Ser: 0.75 mg/dL (ref 0.44–1.00)
GFR calc Af Amer: >60 mL/min
GFR calc non Af Amer: >60 mL/min
Glucose, Bld: 103 mg/dL — ABNORMAL HIGH (ref 70–99)
Potassium: 4.6 mmol/L (ref 3.5–5.1)
Sodium: 139 mmol/L (ref 135–145)

## 2017-09-17 MED ORDER — QUETIAPINE FUMARATE 50 MG PO TABS
50.0000 mg | ORAL_TABLET | Freq: Every day | ORAL | Status: DC
  Administered 2017-09-17 – 2017-09-18 (×2): 50 mg via ORAL
  Filled 2017-09-17 (×2): qty 1

## 2017-09-17 MED ORDER — ALBUTEROL SULFATE (2.5 MG/3ML) 0.083% IN NEBU
5.0000 mg | INHALATION_SOLUTION | Freq: Once | RESPIRATORY_TRACT | Status: AC
  Administered 2017-09-17: 5 mg via RESPIRATORY_TRACT

## 2017-09-17 MED ORDER — FLUTICASONE FUROATE-VILANTEROL 100-25 MCG/INH IN AEPB
1.0000 | INHALATION_SPRAY | Freq: Every day | RESPIRATORY_TRACT | Status: DC
  Administered 2017-09-17: 1 via RESPIRATORY_TRACT
  Filled 2017-09-17: qty 28

## 2017-09-17 MED ORDER — METHYLPREDNISOLONE SODIUM SUCC 125 MG IJ SOLR
60.0000 mg | Freq: Two times a day (BID) | INTRAMUSCULAR | Status: DC
  Administered 2017-09-17 – 2017-09-19 (×5): 60 mg via INTRAVENOUS
  Filled 2017-09-17 (×5): qty 2

## 2017-09-17 MED ORDER — ALBUTEROL SULFATE (2.5 MG/3ML) 0.083% IN NEBU
5.0000 mg | INHALATION_SOLUTION | Freq: Once | RESPIRATORY_TRACT | Status: AC
  Administered 2017-09-17: 5 mg via RESPIRATORY_TRACT
  Filled 2017-09-17: qty 6

## 2017-09-17 MED ORDER — PREDNISONE 20 MG PO TABS: 40.0000 mg | ORAL_TABLET | Freq: Every day | ORAL | Status: DC

## 2017-09-17 MED ORDER — ALPRAZOLAM 0.25 MG PO TABS: 0.2500 mg | ORAL_TABLET | Freq: Every day | ORAL | Status: DC | PRN

## 2017-09-17 MED ORDER — DEXMETHYLPHENIDATE HCL ER 20 MG PO CP24
40.0000 mg | ORAL_CAPSULE | Freq: Every day | ORAL | Status: DC
  Administered 2017-09-18 – 2017-09-19 (×2): 40 mg via ORAL
  Filled 2017-09-17 (×2): qty 2

## 2017-09-17 MED ORDER — RISPERIDONE 2 MG PO TABS
3.0000 mg | ORAL_TABLET | Freq: Every day | ORAL | Status: DC
  Administered 2017-09-17 – 2017-09-18 (×2): 3 mg via ORAL
  Filled 2017-09-17 (×2): qty 1

## 2017-09-17 MED ORDER — NICOTINE 21 MG/24HR TD PT24
21.0000 mg | MEDICATED_PATCH | Freq: Every day | TRANSDERMAL | Status: DC
  Administered 2017-09-17 – 2017-09-19 (×3): 21 mg via TRANSDERMAL
  Filled 2017-09-17 (×3): qty 1

## 2017-09-17 MED ORDER — LAMOTRIGINE 100 MG PO TABS
200.0000 mg | ORAL_TABLET | Freq: Every day | ORAL | Status: DC
  Administered 2017-09-17 – 2017-09-18 (×2): 200 mg via ORAL
  Filled 2017-09-17 (×2): qty 2

## 2017-09-17 MED ORDER — ATORVASTATIN CALCIUM 20 MG PO TABS
20.0000 mg | ORAL_TABLET | Freq: Every day | ORAL | Status: DC
  Administered 2017-09-17 – 2017-09-18 (×2): 20 mg via ORAL
  Filled 2017-09-17 (×2): qty 1

## 2017-09-17 MED ORDER — IPRATROPIUM BROMIDE 0.02 % IN SOLN
0.5000 mg | Freq: Once | RESPIRATORY_TRACT | Status: AC
  Administered 2017-09-17: 0.5 mg via RESPIRATORY_TRACT
  Filled 2017-09-17: qty 2.5

## 2017-09-17 MED ORDER — AZITHROMYCIN 250 MG PO TABS
500.0000 mg | ORAL_TABLET | Freq: Every day | ORAL | Status: DC
  Administered 2017-09-17 – 2017-09-19 (×3): 500 mg via ORAL
  Filled 2017-09-17 (×3): qty 2

## 2017-09-17 MED ORDER — SODIUM CHLORIDE 0.9 % IV SOLN
1.0000 g | INTRAVENOUS | Status: DC
  Administered 2017-09-17 – 2017-09-18 (×2): 1 g via INTRAVENOUS
  Filled 2017-09-17 (×2): qty 1
  Filled 2017-09-17: qty 10

## 2017-09-17 MED ORDER — VENLAFAXINE HCL ER 75 MG PO CP24
150.0000 mg | ORAL_CAPSULE | Freq: Every day | ORAL | Status: DC
  Administered 2017-09-17 – 2017-09-18 (×2): 150 mg via ORAL
  Filled 2017-09-17 (×2): qty 2

## 2017-09-17 MED ORDER — LEVALBUTEROL HCL 0.63 MG/3ML IN NEBU
0.6300 mg | INHALATION_SOLUTION | RESPIRATORY_TRACT | Status: DC | PRN
  Administered 2017-09-18: 0.63 mg via RESPIRATORY_TRACT

## 2017-09-17 MED ORDER — ALBUTEROL (5 MG/ML) CONTINUOUS INHALATION SOLN
15.0000 mg/h | INHALATION_SOLUTION | Freq: Once | RESPIRATORY_TRACT | Status: AC
  Administered 2017-09-17: 15 mg/h via RESPIRATORY_TRACT
  Filled 2017-09-17: qty 20

## 2017-09-17 MED ORDER — PREDNISONE 20 MG PO TABS
60.0000 mg | ORAL_TABLET | Freq: Once | ORAL | Status: AC
Start: 2017-09-17 — End: 2017-09-17
  Administered 2017-09-17: 60 mg via ORAL
  Filled 2017-09-17: qty 3

## 2017-09-17 MED ORDER — SODIUM CHLORIDE 0.9 % IV SOLN
INTRAVENOUS | Status: DC | PRN
  Administered 2017-09-19: 250 mL via INTRAVENOUS

## 2017-09-17 NOTE — ED Triage Notes (Signed)
Pt c/o SOB throughout the day that worsened tonight. Tachypneic and labored, pursed lip breathing. Hx of COPD and was here about 3 weeks ago. She used her nebulizer and rescue inhaler without much relief. A&Ox4.

## 2017-09-17 NOTE — ED Notes (Signed)
Got [facility] to order breakfast tray for patient.

## 2017-09-17 NOTE — ED Notes (Signed)
ED TO INPATIENT HANDOFF REPORT  Name/Age/Gender Julia Daniels 67 y.o. female  Code Status Code Status History    Date Active Date Inactive Code Status Order ID Comments User Context   08/23/2017 1102 08/25/2017 1446 Full Code [id number]  Kerney Elbe, DO Inpatient      Home/SNF/Other Home  Chief Complaint shortness of breath   Level of Care/Admitting Diagnosis ED Disposition    ED Disposition Condition [facility] Area: [facility] [[id number]]  Level of Care: Med-Surg [16]  Diagnosis: COPD with acute exacerbation [facility]) [[id number]]  Admitting Physician: Alma Friendly [[id number]]  Attending Physician: Alma Friendly [[id number]]  PT Class (Do Not Modify): Observation [104]  PT Acc Code (Do Not Modify): Observation [10022]       Medical History Past Medical History:  Diagnosis Date  . Anxiety   . COPD (chronic obstructive pulmonary disease) ([place])   . Depression     Allergies Allergies  Allergen Reactions  . Sulfa Antibiotics Other (See Comments)    Mouth gets raw    IV Location/Drains/Wounds Patient Lines/Drains/Airways Status   Active Line/Drains/Airways    Name:   Placement date:   Placement time:   Site:   Days:   Peripheral IV 08/23/17 Right Antecubital   08/23/17    0645    Antecubital   25   Peripheral IV 08/23/17 Right Hand   08/23/17    0750    Hand   25   Wound 02/07/13 Laceration Finger (Comment which one) Left small 1 cm lac to ring finger   02/07/13    1128    Finger (Comment which one)   1683          Labs/Imaging Results for orders placed or performed during the hospital encounter of 09/17/17 (from the past 48 hour(s))  Basic metabolic panel     Status: Abnormal   Collection Time: 09/17/17  3:02 AM  Result Value Ref Range   Sodium 139 135 - 145 mmol/L   Potassium 4.6 3.5 - 5.1 mmol/L   Chloride 103 98 - 111 mmol/L   CO2 24 22 - 32 mmol/L   Glucose, Bld 103 (H) 70 - 99 mg/dL   BUN 10 8 - 23  mg/dL   Creatinine, Ser 0.75 0.44 - 1.00 mg/dL   Calcium 10.3 8.9 - 10.3 mg/dL   GFR calc non Af Amer >60 >60 mL/min   GFR calc Af Amer >60 >60 mL/min    Comment: (NOTE) The eGFR has been calculated using the CKD EPI equation. This calculation has not been validated in all clinical situations. eGFR's persistently <60 mL/min signify possible Chronic Kidney Disease.    Anion gap 12 5 - 15    Comment: Performed at [facility], [place] [address], [place], [place] [address]  CBC     Status: Abnormal   Collection Time: 09/17/17  3:02 AM  Result Value Ref Range   WBC 9.8 4.0 - 10.5 K/uL   RBC 4.01 3.87 - 5.11 MIL/uL   Hemoglobin 13.6 12.0 - 15.0 g/dL   HCT 41.3 36.0 - 46.0 %   MCV 103.0 (H) 78.0 - 100.0 fL   MCH 33.9 26.0 - 34.0 pg   MCHC 32.9 30.0 - 36.0 g/dL   RDW 13.7 11.5 - 15.5 %   Platelets 349 150 - 400 K/uL    Comment: Performed at [facility], [place] [address], [place], [place]  [address]  Dg Chest Port 1 View  Result Date: 09/17/2017 CLINICAL DATA:  Shortness of breath yesterday that worsened overnight associated to kidney a and labored breathing with pursed lips. History of COPD. Former smoker. EXAM: PORTABLE CHEST 1 VIEW COMPARISON:  PA and lateral chest x-ray of August 24, 2017 FINDINGS: The lungs are mildly hyperinflated. The interstitial markings are coarse. I cannot exclude minimal atelectasis or infiltrate at the right base. The heart is normal in size. The pulmonary vascularity is not engorged. The bony thorax is unremarkable. IMPRESSION: COPD with smoking related changes. Minimal atelectasis or early pneumonia at the right lung base. Followup PA and lateral chest X-ray is recommended in 3-4 weeks following trial of antibiotic therapy to ensure resolution and exclude underlying malignancy. Electronically Signed   By: David  Martinique M.D.   On: 09/17/2017 08:03    Pending Labs Unresulted Labs (From admission, onward)    Start     Ordered    09/17/17 0743  Respiratory Panel by PCR  (Respiratory virus panel)  STAT,   R     09/17/17 0742   Signed and Held  Basic metabolic panel  Tomorrow morning,   R     Signed and Held   Signed and Held  CBC  Tomorrow morning,   R     Signed and Held          Vitals/Pain Today's Vitals   09/17/17 0824 09/17/17 0829 09/17/17 0900 09/17/17 0915  BP:  123/89 (!) 143/80   Pulse:  93 95   Resp:  18 (!) 21   Temp:      TempSrc:      SpO2:  93% 95%   PainSc: 0-No pain   0-No pain    Isolation Precautions Droplet precaution  Medications Medications  levalbuterol (XOPENEX) nebulizer solution 0.63 mg (has no administration in time range)  nicotine (NICODERM CQ - dosed in mg/24 hours) patch 21 mg (has no administration in time range)  ALPRAZolam (XANAX) tablet 0.25 mg (has no administration in time range)  atorvastatin (LIPITOR) tablet 20 mg (has no administration in time range)  fluticasone furoate-vilanterol (BREO ELLIPTA) 100-25 MCG/INH 1 puff (has no administration in time range)  Dexmethylphenidate HCl CP24 40 mg (has no administration in time range)  lamoTRIgine (LAMICTAL) tablet 200 mg (has no administration in time range)  QUEtiapine (SEROQUEL) tablet 50 mg (has no administration in time range)  risperiDONE (RISPERDAL) tablet 3 mg (has no administration in time range)  venlafaxine XR (EFFEXOR-XR) 24 hr capsule 150 mg (has no administration in time range)  methylPREDNISolone sodium succinate (SOLU-MEDROL) 125 mg/2 mL injection 60 mg (has no administration in time range)  azithromycin (ZITHROMAX) tablet 500 mg (has no administration in time range)  cefTRIAXone (ROCEPHIN) 1 g in sodium chloride 0.9 % 100 mL IVPB (has no administration in time range)  albuterol (PROVENTIL) (2.5 MG/3ML) 0.083% nebulizer solution 5 mg (5 mg Nebulization Given 09/17/17 0236)  albuterol (PROVENTIL,VENTOLIN) solution continuous neb (15 mg/hr Nebulization Given 09/17/17 0256)  ipratropium (ATROVENT) nebulizer  solution 0.5 mg (0.5 mg Nebulization Given 09/17/17 0256)  predniSONE (DELTASONE) tablet 60 mg (60 mg Oral Given 09/17/17 0313)  albuterol (PROVENTIL) (2.5 MG/3ML) 0.083% nebulizer solution 5 mg (5 mg Nebulization Given 09/17/17 0553)  ipratropium (ATROVENT) nebulizer solution 0.5 mg (0.5 mg Nebulization Given 09/17/17 0554)    Mobility walks

## 2017-09-17 NOTE — ED Notes (Signed)
Pt. Ambulated in hallway w/ steady gait at about 20 steps , O2 % dropped to 88% from 91%, pt. Admitted of having SOBwhile walking such distance . Pt. Assisted back to bed, 02 M[place] at 2L/min administered with latest 02 sat % of 94%. Kept monitored.

## 2017-09-17 NOTE — ED Provider Notes (Signed)
[facility] DEPT Provider Note   CSN: [id number] Arrival date & time: 09/17/17  0218  Time seen 02:38 AM   History   Chief Complaint Chief Complaint  Patient presents with  . Shortness of Breath    HPI Julia Daniels is a 67 y.o. female.  HPI patient states Saturday, September 7 she started getting dyspnea on exertion.  She states at 9 PM this evening she used a nebulizer which helped some.  She went to work at 11 PM and states her usual job is sitting and using her arms and she felt okay however about 12 midnight she got up to use the bathroom and she got very short of breath on exertion.  She used her inhaler, 2 puffs and states that helped a short while.  She had to do it again at 120 and states it helped a lot.  However shortly after that she started feeling worse.  She has had some cough but attributes that to stopping smoking on August 15.  She states she was admitted on August 15 and she was a lot worse that visit than today.  She states she was unable to tolerate BiPAP.  [facility]  Past Medical History:  Diagnosis Date  . Anxiety   . COPD (chronic obstructive pulmonary disease) ([place])   . Depression     Patient Active Problem List   Diagnosis Date Noted  . Hyperlipidemia 08/23/2017  . Depression 08/23/2017  . Anxiety 08/23/2017  . Hypercalcemia 08/23/2017  . Thrombocytosis ([place]) 08/23/2017  . COPD with acute exacerbation ([place]) 08/23/2017  . Acute respiratory failure with hypoxia and hypercapnia ([place]) 08/23/2017  . ADD (attention deficit disorder) 08/30/2012  . Bipolar disorder, unspecified ([place]) 08/30/2012  . COPD (chronic obstructive pulmonary disease) ([place]) 08/30/2012  . Tobacco abuse 08/30/2012    Past Surgical History:  Procedure Laterality Date  . ABDOMINAL HYSTERECTOMY     partial      OB History   None      Home Medications    Prior to Admission medications   Medication Sig Start Date End  Date Taking? Authorizing Provider  albuterol (PROVENTIL HFA;VENTOLIN HFA) 108 (90 BASE) MCG/ACT inhaler Inhale 2 puffs into the lungs every 6 (six) hours as needed for wheezing or shortness of breath. 02/17/13  Yes Dutch Quint B, FNP  albuterol (PROVENTIL) (2.5 MG/3ML) 0.083% nebulizer solution Take 3 mLs by nebulization 3 (three) times daily as needed for wheezing or shortness of breath.  08/07/17  Yes [provider]  ALPRAZolam Duanne Moron) 0.25 MG tablet Take 0.25 mg by mouth daily as needed for anxiety.  07/21/17  Yes [provider]  atorvastatin (LIPITOR) 20 MG tablet Take 20 mg by mouth at bedtime. 07/27/17  Yes [provider]  Bismuth Subsalicylate (PEPTO-BISMOL) 262 MG TABS Take 2 tablets by mouth daily as needed (upset stomach).   Yes [provider]  BREO ELLIPTA 100-25 MCG/INH AEPB Take 1 puff by mouth at bedtime. 08/02/17  Yes [provider]  Dexmethylphenidate HCl 40 MG CP24 Take 40 mg by mouth at bedtime. 07/21/17  Yes [provider]  lamoTRIgine (LAMICTAL) 200 MG tablet Take 200 mg by mouth at bedtime.    Yes [provider]  magic mouthwash w/lidocaine SOLN Take 5 mLs by mouth 3 (three) times daily. 08/07/17  Yes [provider]  nicotine (NICODERM CQ - DOSED IN MG/24 HOURS) 21 mg/24hr patch Place 21 mg onto the skin daily.  Yes [provider]  QUEtiapine (SEROQUEL) 25 MG tablet Take 50 mg by mouth at bedtime.   Yes [provider]  risperiDONE (RISPERDAL) 1 MG tablet Take 3 mg by mouth at bedtime.  07/06/17  Yes [provider]  venlafaxine XR (EFFEXOR-XR) 150 MG 24 hr capsule Take 150 mg by mouth at bedtime.    Yes [provider]  Vitamin D, Ergocalciferol, (DRISDOL) 50000 units CAPS capsule Take 50,000 Units by mouth once a week. On Saturdays 06/18/17  Yes [provider]  nicotine (NICODERM CQ - DOSED IN MG/24 HOURS) 14 mg/24hr patch Place 1 patch (14 mg total) onto the  skin daily. 08/26/17   Arrien, Jimmy Picket, MD    Family History History reviewed. No pertinent family history.  Social History Social History   Tobacco Use  . Smoking status: Former Smoker    Packs/day: 0.75    Last attempt to quit: 11/08/2012    Years since quitting: 4.8  . Smokeless tobacco: Never Used  Substance Use Topics  . Alcohol use: No  . Drug use: No  employed Quit smoking Aug 23, 2017   Allergies   Sulfa antibiotics   Review of Systems Review of Systems  All other systems reviewed and are negative.    Physical Exam Updated Vital Signs BP 124/74 (BP Location: Left Arm)   Pulse (!) 114   Temp 98.2 F (36.8 C) (Oral)   Resp 19   SpO2 93%   Physical Exam  Constitutional: She is oriented to person, place, and time. She appears well-developed and well-nourished.  Non-toxic appearance. She does not appear ill. No distress.  HENT:  Head: Normocephalic and atraumatic.  Right Ear: External ear normal.  Left Ear: External ear normal.  Nose: Nose normal. No mucosal edema or rhinorrhea.  Mouth/Throat: Oropharynx is clear and moist and mucous membranes are normal. No dental abscesses or uvula swelling.  Eyes: Pupils are equal, round, and reactive to light. Conjunctivae and EOM are normal.  Neck: Normal range of motion and full passive range of motion without pain. Neck supple.  Cardiovascular: Normal rate, regular rhythm and normal heart sounds. Exam reveals no gallop and no friction rub.  No murmur heard. Pulmonary/Chest: Accessory muscle usage present. Tachypnea noted. No respiratory distress. She has decreased breath sounds. She has wheezes. She has rhonchi. She has no rales. She exhibits no tenderness and no crepitus.  Pursed lip breathing  Abdominal: Soft. Normal appearance and bowel sounds are normal. She exhibits no distension. There is no tenderness. There is no rebound and no guarding.  Musculoskeletal: Normal range of motion. She exhibits no edema or  tenderness.  Moves all extremities well.   Neurological: She is alert and oriented to person, place, and time. She has normal strength. No cranial nerve deficit.  Skin: Skin is warm, dry and intact. No rash noted. No erythema. No pallor.  Psychiatric: She has a normal mood and affect. Her speech is normal and behavior is normal. Her mood appears not anxious.  Nursing note and vitals reviewed.    ED Treatments / Results  Labs (all labs ordered are listed, but only abnormal results are displayed) Results for orders placed or performed during the hospital encounter of 32/95/18  Basic metabolic panel  Result Value Ref Range   Sodium 139 135 - 145 mmol/L   Potassium 4.6 3.5 - 5.1 mmol/L   Chloride 103 98 - 111 mmol/L   CO2 24 22 - 32 mmol/L   Glucose, Bld  103 (H) 70 - 99 mg/dL   BUN 10 8 - 23 mg/dL   Creatinine, Ser 0.75 0.44 - 1.00 mg/dL   Calcium 10.3 8.9 - 10.3 mg/dL   GFR calc non Af Amer >60 >60 mL/min   GFR calc Af Amer >60 >60 mL/min   Anion gap 12 5 - 15  CBC  Result Value Ref Range   WBC 9.8 4.0 - 10.5 K/uL   RBC 4.01 3.87 - 5.11 MIL/uL   Hemoglobin 13.6 12.0 - 15.0 g/dL   HCT 41.3 36.0 - 46.0 %   MCV 103.0 (H) 78.0 - 100.0 fL   MCH 33.9 26.0 - 34.0 pg   MCHC 32.9 30.0 - 36.0 g/dL   RDW 13.7 11.5 - 15.5 %   Platelets 349 150 - 400 K/uL      EKG None  Radiology No results found.   Dg Chest 2 View  Result Date: 08/24/2017 CLINICAL DATA:  Shortness of breath. IMPRESSION: No active cardiopulmonary disease. Electronically Signed   By: Kathreen Devoid   On: 08/24/2017 09:34   Ct Angio Chest Pe W/cm &/or Wo Cm  Result Date: 08/23/2017 CLINICAL DATA:  Shortness of breath over the last month which is worsening. MPRESSION: No pulmonary emboli or other acute chest pathology. Mild basilar atelectasis and or scarring. Coronary artery calcification. Minimal aortic atherosclerotic calcification. Electronically Signed   By: Nelson Chimes M.D.   On: 08/23/2017 10:42   Dg Chest  Port 1 View  Result Date: 08/23/2017 CLINICAL DATA:  Shortness of breath.  IMPRESSION: No active disease. Electronically Signed   By: Dorise Bullion III M.D   On: 08/23/2017 07:46    Procedures .Critical Care Performed by: Rolland Porter, MD Authorized by: Rolland Porter, MD   Critical care provider statement:    Critical care time (minutes):  37   Critical care was necessary to treat or prevent imminent or life-threatening deterioration of the following conditions:  Respiratory failure   Critical care was time spent personally by me on the following activities:  Discussions with consultants, examination of patient, obtaining history from patient or surrogate, pulse oximetry, re-evaluation of patient's condition and ordering and review of laboratory studies   (including critical care time)  Medications Ordered in ED Medications  albuterol (PROVENTIL) (2.5 MG/3ML) 0.083% nebulizer solution 5 mg (5 mg Nebulization Given 09/17/17 0236)  albuterol (PROVENTIL,VENTOLIN) solution continuous neb (15 mg/hr Nebulization Given 09/17/17 0256)  ipratropium (ATROVENT) nebulizer solution 0.5 mg (0.5 mg Nebulization Given 09/17/17 0256)  predniSONE (DELTASONE) tablet 60 mg (60 mg Oral Given 09/17/17 0313)  albuterol (PROVENTIL) (2.5 MG/3ML) 0.083% nebulizer solution 5 mg (5 mg Nebulization Given 09/17/17 0553)  ipratropium (ATROVENT) nebulizer solution 0.5 mg (0.5 mg Nebulization Given 09/17/17 0554)     Initial Impression / Assessment and Plan / ED Course  I have reviewed the triage vital signs and the nursing notes.  Pertinent labs & imaging results that were available during my care of the patient were reviewed by me and considered in my medical decision making (see chart for details).     At the time of my exam patient had already had one regular albuterol nebulizer.  She was given a continuous nebulizer with albuterol 50 mg plus Atrovent 0.5 mg.  She was started on oral prednisone.  I considered using BiPAP  however she states she did not tolerate it well when she was admitted 2 weeks ago.  Recheck at 4:05 AM, patient has almost finished her continuous nebulizer.  She has a heart rate of 123.  She states she is feeling much improved.  When I listen to her she still has some end expiratory wheezing but she has much improved air movement.  After she finishes her nebulizer I will have the nurses ambulator and see how she sounds after that and to make sure that she does not get worse with exertion.  We discussed that she might need to be admitted.  Nursing staff ambulated patient and her pulse ox dropped to 88% and she started getting more tachypneic.  5:30 AM when I listen to her she now has more wheezing than she did the last time I examined her.  She is not having the pursed lip breathing however.  She has been placed on nasal cannula oxygen.  I talked to the patient about admission and she is agreeable.  She was given a regular albuterol 5 mg/Atrovent 0.5 mg nebulizer treatment.  5:55 AM Dr. Alcario Drought, hospitalist will admit.  Final Clinical Impressions(s) / ED Diagnoses   Final diagnoses:  COPD exacerbation ([place])  Hypoxia    Plan admission  Rolland Porter, MD, Barbette Or, MD 09/17/17 [phone]

## 2017-09-17 NOTE — H&P (Signed)
History and Physical  Julia Daniels JSH:[id number] DOB: [birth year] DOA: 09/17/2017   PCP: [facility]  Patient coming from: Home & is able to ambulate independently  Chief Complaint: Shortness of breath  HPI: Julia Daniels is a 67 y.o. female with medical history significant for COPD, anxiety, depression, bipolar, presents to the ED complaining of worsening shortness of breath for the past 2 days.  Patient tried using her inhalers without any significant relief.  Patient does report productive cough of yellowish sputum, denies any fever/chills, chest pain, abdominal pain, nausea/vomiting.  Patient denies any sick contacts, long distance travel.  Of note, patient was just discharged on 08/25/2017 for similar condition.  ED Course: Patient noted to be tachycardic, tachypneic, saturating around 91% on room air, which dropped to the 80s on ambulation.  Labs unremarkable.  Patient was given nebs treatments, prednisone.  No chest x-ray done.  Patient admitted for further management  Review of Systems: Review of systems are otherwise negative   Past Medical History:  Diagnosis Date  . Anxiety   . COPD (chronic obstructive pulmonary disease) ([place])   . Depression    Past Surgical History:  Procedure Laterality Date  . ABDOMINAL HYSTERECTOMY     partial     Social History:  reports that she quit smoking about 4 years ago. She smoked 0.75 packs per day. She has never used smokeless tobacco. She reports that she does not drink alcohol or use drugs.   Allergies  Allergen Reactions  . Sulfa Antibiotics Other (See Comments)    Mouth gets raw    History reviewed. No pertinent family history.    Prior to Admission medications   Medication Sig Start Date End Date Taking? Authorizing Provider  albuterol (PROVENTIL HFA;VENTOLIN HFA) 108 (90 BASE) MCG/ACT inhaler Inhale 2 puffs into the lungs every 6 (six) hours as needed for wheezing or shortness of breath. 02/17/13  Yes  Dutch Quint B, FNP  albuterol (PROVENTIL) (2.5 MG/3ML) 0.083% nebulizer solution Take 3 mLs by nebulization 3 (three) times daily as needed for wheezing or shortness of breath.  08/07/17  Yes [provider]  ALPRAZolam Duanne Moron) 0.25 MG tablet Take 0.25 mg by mouth daily as needed for anxiety.  07/21/17  Yes [provider]  atorvastatin (LIPITOR) 20 MG tablet Take 20 mg by mouth at bedtime. 07/27/17  Yes [provider]  Bismuth Subsalicylate (PEPTO-BISMOL) 262 MG TABS Take 2 tablets by mouth daily as needed (upset stomach).   Yes [provider]  BREO ELLIPTA 100-25 MCG/INH AEPB Take 1 puff by mouth at bedtime. 08/02/17  Yes [provider]  Dexmethylphenidate HCl 40 MG CP24 Take 40 mg by mouth at bedtime. 07/21/17  Yes [provider]  lamoTRIgine (LAMICTAL) 200 MG tablet Take 200 mg by mouth at bedtime.    Yes [provider]  magic mouthwash w/lidocaine SOLN Take 5 mLs by mouth 3 (three) times daily. 08/07/17  Yes [provider]  nicotine (NICODERM CQ - DOSED IN MG/24 HOURS) 21 mg/24hr patch Place 21 mg onto the skin daily.   Yes [provider]  QUEtiapine (SEROQUEL) 25 MG tablet Take 50 mg by mouth at bedtime.   Yes [provider]  risperiDONE (RISPERDAL) 1 MG tablet Take 3 mg by mouth at bedtime.  07/06/17  Yes [provider]  venlafaxine XR (EFFEXOR-XR) 150 MG 24 hr capsule Take 150 mg by mouth at bedtime.    Yes [provider]  Vitamin D, Ergocalciferol, (  DRISDOL) 50000 units CAPS capsule Take 50,000 Units by mouth once a week. On Saturdays 06/18/17  Yes [provider]  nicotine (NICODERM CQ - DOSED IN MG/24 HOURS) 14 mg/24hr patch Place 1 patch (14 mg total) onto the skin daily. 08/26/17   Arrien, Jimmy Picket, MD    Physical Exam: BP 124/74 (BP Location: Left Arm)   Pulse (!) 108   Temp 98.2 F (36.8 C) (Oral)   Resp 19   SpO2 98%   General: NAD Eyes: Normal ENT:  Normal Neck: Supple Cardiovascular: S1, S2 present Respiratory: Bilateral expiratory wheezing noted Abdomen: Soft, nontender, nondistended, bowel sounds present Skin: Normal Musculoskeletal: No pedal edema bilaterally Psychiatric: Normal mood Neurologic: No focal neurologic deficits noted          Labs on Admission:  Basic Metabolic Panel: Recent Labs  Lab 09/17/17 0302  NA 139  K 4.6  CL 103  CO2 24  GLUCOSE 103*  BUN 10  CREATININE 0.75  CALCIUM 10.3   Liver Function Tests: No results for input(s): AST, ALT, ALKPHOS, BILITOT, PROT, ALBUMIN in the last 168 hours. No results for input(s): LIPASE, AMYLASE in the last 168 hours. No results for input(s): AMMONIA in the last 168 hours. CBC: Recent Labs  Lab 09/17/17 0302  WBC 9.8  HGB 13.6  HCT 41.3  MCV 103.0*  PLT 349   Cardiac Enzymes: No results for input(s): CKTOTAL, CKMB, CKMBINDEX, TROPONINI in the last 168 hours.  BNP (last 3 results) Recent Labs    08/23/17 1206  BNP 50.1    ProBNP (last 3 results) No results for input(s): PROBNP in the last 8760 hours.  CBG: No results for input(s): GLUCAP in the last 168 hours.  Radiological Exams on Admission: No results found.  EKG: Pending  Assessment/Plan Present on Admission: . COPD with acute exacerbation ([place]) . Bipolar disorder, unspecified ([place]) . Hyperlipidemia . Depression . Anxiety  Principal Problem:   COPD with acute exacerbation ([place]) Active Problems:   Bipolar disorder, unspecified ([place])   Hyperlipidemia   Depression   Anxiety  Acute hypoxic respiratory failure 2/2 COPD exacerbation Desaturated to 88% on room air, tachycardic tachypneic on presentation Currently afebrile, no leukocytosis Respiratory viral panel pending Chest x-ray pending Start p.o. azithromycin Continue nebs treatment, inhalers, Mucinex Continue IV Solu-Medrol Supplemental oxygen  Hyperlipidemia Continue statins  History of tobacco abuse Patient quit  smoking in 08/2017 Continue nicotine patch  Bipolar/depression/anxiety Continue home Xanax as needed, Lamictal, quetiapine, risperidone, dexmethylphenidate    DVT prophylaxis: Lovenox  Code Status: Full  Family Communication: None at bedside  Disposition Plan: Home  Consults called: None  Admission status: Observation    Alma Friendly MD [facility]   If 7PM-7AM, please contact night-coverage [email or web address]   09/17/2017, 7:58 AM

## 2017-09-17 NOTE — ED Notes (Signed)
Called to give report, RN unavailable at this time, awaiting a call back.

## 2017-09-18 DIAGNOSIS — R0602 Shortness of breath: Secondary | ICD-10-CM | POA: Diagnosis present

## 2017-09-18 DIAGNOSIS — J441 Chronic obstructive pulmonary disease with (acute) exacerbation: Secondary | ICD-10-CM | POA: Diagnosis not present

## 2017-09-18 DIAGNOSIS — J159 Unspecified bacterial pneumonia: Secondary | ICD-10-CM | POA: Diagnosis present

## 2017-09-18 DIAGNOSIS — F329 Major depressive disorder, single episode, unspecified: Secondary | ICD-10-CM | POA: Diagnosis not present

## 2017-09-18 DIAGNOSIS — F419 Anxiety disorder, unspecified: Secondary | ICD-10-CM | POA: Diagnosis present

## 2017-09-18 DIAGNOSIS — Z7951 Long term (current) use of inhaled steroids: Secondary | ICD-10-CM | POA: Diagnosis not present

## 2017-09-18 DIAGNOSIS — R0902 Hypoxemia: Secondary | ICD-10-CM | POA: Diagnosis not present

## 2017-09-18 DIAGNOSIS — J440 Chronic obstructive pulmonary disease with acute lower respiratory infection: Secondary | ICD-10-CM | POA: Diagnosis present

## 2017-09-18 DIAGNOSIS — J9601 Acute respiratory failure with hypoxia: Secondary | ICD-10-CM | POA: Diagnosis present

## 2017-09-18 DIAGNOSIS — Z87891 Personal history of nicotine dependence: Secondary | ICD-10-CM | POA: Diagnosis not present

## 2017-09-18 DIAGNOSIS — F319 Bipolar disorder, unspecified: Secondary | ICD-10-CM | POA: Diagnosis not present

## 2017-09-18 DIAGNOSIS — E785 Hyperlipidemia, unspecified: Secondary | ICD-10-CM | POA: Diagnosis present

## 2017-09-18 LAB — RESPIRATORY PANEL BY PCR
ADENOVIRUS-RVPPCR: NOT DETECTED
BORDETELLA PERTUSSIS-RVPCR: NOT DETECTED
CORONAVIRUS 229E-RVPPCR: NOT DETECTED
CORONAVIRUS HKU1-RVPPCR: NOT DETECTED
CORONAVIRUS NL63-RVPPCR: NOT DETECTED
Chlamydophila pneumoniae: NOT DETECTED
Coronavirus OC43: NOT DETECTED
Influenza A: NOT DETECTED
Influenza B: NOT DETECTED
METAPNEUMOVIRUS-RVPPCR: NOT DETECTED
Mycoplasma pneumoniae: NOT DETECTED
PARAINFLUENZA VIRUS 1-RVPPCR: NOT DETECTED
PARAINFLUENZA VIRUS 2-RVPPCR: NOT DETECTED
PARAINFLUENZA VIRUS 3-RVPPCR: NOT DETECTED
Parainfluenza Virus 4: NOT DETECTED
RHINOVIRUS / ENTEROVIRUS - RVPPCR: NOT DETECTED
Respiratory Syncytial Virus: NOT DETECTED

## 2017-09-18 LAB — CBC
HEMATOCRIT: 40.8 % (ref 36.0–46.0)
Hemoglobin: 13.5 g/dL (ref 12.0–15.0)
MCH: 34.1 pg — ABNORMAL HIGH (ref 26.0–34.0)
MCHC: 33.1 g/dL (ref 30.0–36.0)
MCV: 103 fL — AB (ref 78.0–100.0)
PLATELETS: 356 10*3/uL (ref 150–400)
RBC: 3.96 MIL/uL (ref 3.87–5.11)
RDW: 13.7 % (ref 11.5–15.5)
WBC: 13.9 10*3/uL — AB (ref 4.0–10.5)

## 2017-09-18 LAB — BASIC METABOLIC PANEL
Anion gap: 9 (ref 5–15)
BUN: 11 mg/dL (ref 8–23)
CHLORIDE: 105 mmol/L (ref 98–111)
CO2: 26 mmol/L (ref 22–32)
CREATININE: 0.65 mg/dL (ref 0.44–1.00)
Calcium: 10.1 mg/dL (ref 8.9–10.3)
GFR calc non Af Amer: >60 mL/min (ref >60)
Glucose, Bld: 127 mg/dL — ABNORMAL HIGH (ref 70–99)
POTASSIUM: 4.6 mmol/L (ref 3.5–5.1)
SODIUM: 140 mmol/L (ref 135–145)

## 2017-09-18 MED ORDER — IPRATROPIUM BROMIDE 0.02 % IN SOLN: 0.5000 mg | RESPIRATORY_TRACT | Status: DC | PRN

## 2017-09-18 MED ORDER — LORATADINE 10 MG PO TABS
10.0000 mg | ORAL_TABLET | Freq: Every day | ORAL | Status: DC
  Administered 2017-09-18 – 2017-09-19 (×2): 10 mg via ORAL
  Filled 2017-09-18 (×2): qty 1

## 2017-09-18 MED ORDER — BUDESONIDE 0.5 MG/2ML IN SUSP
0.5000 mg | Freq: Two times a day (BID) | RESPIRATORY_TRACT | Status: DC
  Administered 2017-09-18 – 2017-09-19 (×2): 0.5 mg via RESPIRATORY_TRACT
  Filled 2017-09-18 (×2): qty 2

## 2017-09-18 MED ORDER — IPRATROPIUM BROMIDE 0.02 % IN SOLN
0.5000 mg | Freq: Three times a day (TID) | RESPIRATORY_TRACT | Status: DC
  Administered 2017-09-19: 0.5 mg via RESPIRATORY_TRACT
  Filled 2017-09-18: qty 2.5

## 2017-09-18 MED ORDER — GUAIFENESIN ER 600 MG PO TB12
600.0000 mg | ORAL_TABLET | Freq: Two times a day (BID) | ORAL | Status: DC
  Administered 2017-09-18 – 2017-09-19 (×3): 600 mg via ORAL
  Filled 2017-09-18 (×3): qty 1

## 2017-09-18 MED ORDER — ONDANSETRON HCL 4 MG PO TABS: 4.0000 mg | ORAL_TABLET | Freq: Four times a day (QID) | ORAL | Status: DC | PRN

## 2017-09-18 MED ORDER — TRAMADOL HCL 50 MG PO TABS: 50.0000 mg | ORAL_TABLET | Freq: Four times a day (QID) | ORAL | Status: DC | PRN

## 2017-09-18 MED ORDER — ACETAMINOPHEN 650 MG RE SUPP: 650.0000 mg | Freq: Four times a day (QID) | RECTAL | Status: DC | PRN

## 2017-09-18 MED ORDER — IPRATROPIUM BROMIDE 0.02 % IN SOLN
0.5000 mg | Freq: Four times a day (QID) | RESPIRATORY_TRACT | Status: DC
  Administered 2017-09-18 (×2): 0.5 mg via RESPIRATORY_TRACT
  Filled 2017-09-18 (×2): qty 2.5

## 2017-09-18 MED ORDER — ENOXAPARIN SODIUM 40 MG/0.4ML ~~LOC~~ SOLN
40.0000 mg | SUBCUTANEOUS | Status: DC
  Administered 2017-09-18: 40 mg via SUBCUTANEOUS
  Filled 2017-09-18 (×2): qty 0.4

## 2017-09-18 MED ORDER — ACETAMINOPHEN 325 MG PO TABS: 650.0000 mg | ORAL_TABLET | Freq: Four times a day (QID) | ORAL | Status: DC | PRN

## 2017-09-18 MED ORDER — ONDANSETRON HCL 4 MG/2ML IJ SOLN: 4.0000 mg | Freq: Four times a day (QID) | INTRAMUSCULAR | Status: DC | PRN

## 2017-09-18 MED ORDER — PANTOPRAZOLE SODIUM 40 MG PO TBEC
40.0000 mg | DELAYED_RELEASE_TABLET | Freq: Every day | ORAL | Status: DC
  Administered 2017-09-18 – 2017-09-19 (×2): 40 mg via ORAL
  Filled 2017-09-18 (×2): qty 1

## 2017-09-18 MED ORDER — LEVALBUTEROL HCL 0.63 MG/3ML IN NEBU
0.6300 mg | INHALATION_SOLUTION | Freq: Four times a day (QID) | RESPIRATORY_TRACT | Status: DC
  Administered 2017-09-18 (×2): 0.63 mg via RESPIRATORY_TRACT
  Filled 2017-09-18 (×3): qty 3

## 2017-09-18 MED ORDER — LEVALBUTEROL HCL 0.63 MG/3ML IN NEBU
0.6300 mg | INHALATION_SOLUTION | Freq: Three times a day (TID) | RESPIRATORY_TRACT | Status: DC
  Administered 2017-09-19: 0.63 mg via RESPIRATORY_TRACT
  Filled 2017-09-18: qty 3

## 2017-09-18 MED ORDER — SENNOSIDES-DOCUSATE SODIUM 8.6-50 MG PO TABS: 1.0000 | ORAL_TABLET | Freq: Every evening | ORAL | Status: DC | PRN

## 2017-09-18 MED ORDER — FLUTICASONE PROPIONATE 50 MCG/ACT NA SUSP
2.0000 | Freq: Every day | NASAL | Status: DC
  Administered 2017-09-18 – 2017-09-19 (×2): 2 via NASAL
  Filled 2017-09-18: qty 16

## 2017-09-18 NOTE — Progress Notes (Signed)
PROGRESS NOTE    Julia Daniels  HDQ:[id number] DOB: 1950/12/13 DOA: 09/17/2017 PCP: [facility]    Brief Narrative:  Patient is 67 year old lady history of COPD, anxiety, depression, bipolar disorder presented to the ED with worsening shortness of breath x2 days.  Patient tried inhalers with no significant improvement.  Patient endorsing productive cough with yellowish sputum.  Patient noted to be tachycardic tachypneic with sats of 91% on room air which dropped to 80% on ambulation.  Patient admitted for an acute COPD exacerbation.   Assessment & Plan:   Principal Problem:   COPD with acute exacerbation ([place]) Active Problems:   Bipolar disorder, unspecified ([place])   Hyperlipidemia   Depression   Anxiety   COPD exacerbation ([place])  #1 acute COPD exacerbation Questionable etiology.  Chest x-ray done with basilar atelectasis versus early pneumonia.  Patient noted to be satting 88% on room air with tachycardia and tachypnea on admission.  Respiratory viral panel negative.  Patient with some clinical improvement however not at baseline.  Continue IV steroid taper.  Place on Pulmicort nebs.  Increase Mucinex to 1200 twice daily.  Placed on scheduled Atrovent and Xopenex nebulizers.  Placed on Flonase, Claritin.  Continue azithromycin and IV Rocephin.  Follow.  2.  Bipolar disorder Stable.  Continue Lamictal, Risperdal, Seroquel.    3.  Depression/anxiety Continue Effexor.  Outpatient follow-up.  4.  Hyperlipidemia Continue statin.  5.  Tobacco abuse Patient noted to have quit smoking in August 2019.  Continue nicotine patch.   DVT prophylaxis: Lovenox Code Status: Full Family Communication: Updated patient.  No family at bedside. Disposition Plan: Likely home when clinically improved and hypoxia has resolved.   Consultants:   None  Procedures:   Chest x-ray 09/17/2017  Antimicrobials:   Azithromycin 09/17/2017  IV Rocephin  09/17/2017   Subjective: States some improvement with shortness of breath since admission however not at baseline.  No chest pain.  Objective: Vitals:   09/18/17 0149 09/18/17 0525 09/18/17 0728 09/18/17 1359  BP: 140/87 (!) 144/88  (!) 158/84  Pulse: 88 88  86  Resp: 17 18  18   Temp: 98.8 F (37.1 C) 99 F (37.2 C)  99.5 F (37.5 C)  TempSrc: Oral Oral  Oral  SpO2:  97% 92% 97%  Weight: 81.2 kg     Height: 5\' 4"  (1.626 m)       Intake/Output Summary (Last 24 hours) at 09/18/2017 1727 Last data filed at 09/18/2017 1556 Gross per 24 hour  Intake 1371.92 ml  Output -  Net 1371.92 ml   Filed Weights   09/18/17 0149  Weight: 81.2 kg    Examination:  General exam: Appears calm and comfortable.  Dry mucous membranes. Respiratory system: Inspiratory and expiratory wheezing.  No crackles.  No rhonchi.  Respiratory effort normal. Cardiovascular system: S1 & S2 heard, RRR. No JVD, murmurs, rubs, gallops or clicks. No pedal edema. Gastrointestinal system: Abdomen is nondistended, soft and nontender. No organomegaly or masses felt. Normal bowel sounds heard. Central nervous system: Alert and oriented. No focal neurological deficits. Extremities: Symmetric 5 x 5 power. Skin: No rashes, lesions or ulcers Psychiatry: Judgement and insight appear normal. Mood & affect appropriate.     Data Reviewed: I have personally reviewed following labs and imaging studies  CBC: Recent Labs  Lab 09/17/17 0302 09/18/17 0555  WBC 9.8 13.9*  HGB 13.6 13.5  HCT 41.3 40.8  MCV 103.0* 103.0*  PLT 349 119   Basic Metabolic Panel: Recent  Labs  Lab 09/17/17 0302 09/18/17 0555  NA 139 140  K 4.6 4.6  CL 103 105  CO2 24 26  GLUCOSE 103* 127*  BUN 10 11  CREATININE 0.75 0.65  CALCIUM 10.3 10.1   GFR: Estimated Creatinine Clearance: 73.2 mL/min (by C-G formula based on SCr of 0.65 mg/dL). Liver Function Tests: No results for input(s): AST, ALT, ALKPHOS, BILITOT, PROT, ALBUMIN in the  last 168 hours. No results for input(s): LIPASE, AMYLASE in the last 168 hours. No results for input(s): AMMONIA in the last 168 hours. Coagulation Profile: No results for input(s): INR, PROTIME in the last 168 hours. Cardiac Enzymes: No results for input(s): CKTOTAL, CKMB, CKMBINDEX, TROPONINI in the last 168 hours. BNP (last 3 results) No results for input(s): PROBNP in the last 8760 hours. HbA1C: No results for input(s): HGBA1C in the last 72 hours. CBG: No results for input(s): GLUCAP in the last 168 hours. Lipid Profile: No results for input(s): CHOL, HDL, LDLCALC, TRIG, CHOLHDL, LDLDIRECT in the last 72 hours. Thyroid Function Tests: No results for input(s): TSH, T4TOTAL, FREET4, T3FREE, THYROIDAB in the last 72 hours. Anemia Panel: No results for input(s): VITAMINB12, FOLATE, FERRITIN, TIBC, IRON, RETICCTPCT in the last 72 hours. Sepsis Labs: No results for input(s): PROCALCITON, LATICACIDVEN in the last 168 hours.  Recent Results (from the past 240 hour(s))  Respiratory Panel by PCR     Status: None   Collection Time: 09/18/17  6:09 AM  Result Value Ref Range Status   Adenovirus NOT DETECTED NOT DETECTED Final   Coronavirus 229E NOT DETECTED NOT DETECTED Final   Coronavirus HKU1 NOT DETECTED NOT DETECTED Final   Coronavirus NL63 NOT DETECTED NOT DETECTED Final   Coronavirus OC43 NOT DETECTED NOT DETECTED Final   Metapneumovirus NOT DETECTED NOT DETECTED Final   Rhinovirus / Enterovirus NOT DETECTED NOT DETECTED Final   Influenza A NOT DETECTED NOT DETECTED Final   Influenza B NOT DETECTED NOT DETECTED Final   Parainfluenza Virus 1 NOT DETECTED NOT DETECTED Final   Parainfluenza Virus 2 NOT DETECTED NOT DETECTED Final   Parainfluenza Virus 3 NOT DETECTED NOT DETECTED Final   Parainfluenza Virus 4 NOT DETECTED NOT DETECTED Final   Respiratory Syncytial Virus NOT DETECTED NOT DETECTED Final   Bordetella pertussis NOT DETECTED NOT DETECTED Final   Chlamydophila pneumoniae  NOT DETECTED NOT DETECTED Final   Mycoplasma pneumoniae NOT DETECTED NOT DETECTED Final    Comment: Performed at [facility], [address], [place], [place] [address]         Radiology Studies: Dg Chest Port 1 View  Result Date: 09/17/2017 CLINICAL DATA:  Shortness of breath yesterday that worsened overnight associated to kidney a and labored breathing with pursed lips. History of COPD. Former smoker. EXAM: PORTABLE CHEST 1 VIEW COMPARISON:  PA and lateral chest x-ray of August 24, 2017 FINDINGS: The lungs are mildly hyperinflated. The interstitial markings are coarse. I cannot exclude minimal atelectasis or infiltrate at the right base. The heart is normal in size. The pulmonary vascularity is not engorged. The bony thorax is unremarkable. IMPRESSION: COPD with smoking related changes. Minimal atelectasis or early pneumonia at the right lung base. Followup PA and lateral chest X-ray is recommended in 3-4 weeks following trial of antibiotic therapy to ensure resolution and exclude underlying malignancy. Electronically Signed   By: David  Martinique M.D.   On: 09/17/2017 08:03        Scheduled Meds: . atorvastatin  20 mg Oral QHS  .  azithromycin  500 mg Oral Daily  . budesonide (PULMICORT) nebulizer solution  0.5 mg Nebulization BID  . dexmethylphenidate  40 mg Oral Daily  . enoxaparin (LOVENOX) injection  40 mg Subcutaneous Q24H  . fluticasone  2 spray Each Nare Daily  . guaiFENesin  600 mg Oral BID  . ipratropium  0.5 mg Nebulization Q6H  . lamoTRIgine  200 mg Oral QHS  . levalbuterol  0.63 mg Nebulization Q6H  . loratadine  10 mg Oral Daily  . methylPREDNISolone (SOLU-MEDROL) injection  60 mg Intravenous Q12H  . nicotine  21 mg Transdermal Daily  . pantoprazole  40 mg Oral Daily  . QUEtiapine  50 mg Oral QHS  . risperiDONE  3 mg Oral QHS  . venlafaxine XR  150 mg Oral QHS   Continuous Infusions: . sodium chloride    . cefTRIAXone (ROCEPHIN)  IV Stopped (09/18/17 1333)      LOS: 0 days    Time spent: 40 minutes    Irine Seal, MD [facility] Pager [phone]  If 7PM-7AM, please contact night-coverage [email or web address] Password [facility] 09/18/2017, 5:27 PM

## 2017-09-18 NOTE — Progress Notes (Signed)
After standing up and before ambulation patient SpO2 was 95. During ambulation. Desat to 91. Patient didn't verbalize SOB during ambulation but showed mild dyspnea on exertion. Once she was done she did state she felt slightly winded.

## 2017-09-19 DIAGNOSIS — J441 Chronic obstructive pulmonary disease with (acute) exacerbation: Principal | ICD-10-CM

## 2017-09-19 DIAGNOSIS — R0902 Hypoxemia: Secondary | ICD-10-CM

## 2017-09-19 DIAGNOSIS — F319 Bipolar disorder, unspecified: Secondary | ICD-10-CM

## 2017-09-19 LAB — CBC WITH DIFFERENTIAL/PLATELET
Basophils Absolute: 0 10*3/uL (ref 0.0–0.1)
Basophils Relative: 0 %
EOS ABS: 0.2 10*3/uL (ref 0.0–0.7)
EOS PCT: 2 %
HCT: 40.3 % (ref 36.0–46.0)
HEMOGLOBIN: 13.2 g/dL (ref 12.0–15.0)
LYMPHS ABS: 3.1 10*3/uL (ref 0.7–4.0)
Lymphocytes Relative: 26 %
MCH: 33.8 pg (ref 26.0–34.0)
MCHC: 32.8 g/dL (ref 30.0–36.0)
MCV: 103.1 fL — ABNORMAL HIGH (ref 78.0–100.0)
MONOS PCT: 7 %
Monocytes Absolute: 0.8 10*3/uL (ref 0.1–1.0)
Neutro Abs: 7.6 10*3/uL (ref 1.7–7.7)
Neutrophils Relative %: 65 %
PLATELETS: 347 10*3/uL (ref 150–400)
RBC: 3.91 MIL/uL (ref 3.87–5.11)
RDW: 13.7 % (ref 11.5–15.5)
WBC: 11.8 10*3/uL — ABNORMAL HIGH (ref 4.0–10.5)

## 2017-09-19 LAB — BASIC METABOLIC PANEL
Anion gap: 8 (ref 5–15)
BUN: 13 mg/dL (ref 8–23)
CALCIUM: 9.9 mg/dL (ref 8.9–10.3)
CHLORIDE: 102 mmol/L (ref 98–111)
CO2: 28 mmol/L (ref 22–32)
Creatinine, Ser: 0.67 mg/dL (ref 0.44–1.00)
GFR calc Af Amer: >60 mL/min (ref >60)
Glucose, Bld: 94 mg/dL (ref 70–99)
Potassium: 4.5 mmol/L (ref 3.5–5.1)
SODIUM: 138 mmol/L (ref 135–145)

## 2017-09-19 MED ORDER — CEFUROXIME AXETIL 500 MG PO TABS: 500.0000 mg | ORAL_TABLET | Freq: Two times a day (BID) | ORAL | 0 refills | Status: AC

## 2017-09-19 MED ORDER — GUAIFENESIN ER 600 MG PO TB12: 600.0000 mg | ORAL_TABLET | Freq: Two times a day (BID) | ORAL | 0 refills | Status: AC

## 2017-09-19 MED ORDER — PREDNISONE 20 MG PO TABS: 40.0000 mg | ORAL_TABLET | Freq: Every day | ORAL | 0 refills | Status: AC

## 2017-09-19 MED ORDER — CEFDINIR 300 MG PO CAPS
300.0000 mg | ORAL_CAPSULE | Freq: Two times a day (BID) | ORAL | Status: DC
  Administered 2017-09-19: 300 mg via ORAL
  Filled 2017-09-19: qty 1

## 2017-09-19 MED ORDER — CEFDINIR 300 MG PO CAPS: 300.0000 mg | ORAL_CAPSULE | Freq: Two times a day (BID) | ORAL | Status: DC

## 2017-09-19 NOTE — Discharge Summary (Signed)
Physician Discharge Summary  Julia Daniels:[id number] DOB: [birth year] DOA: 09/17/2017  PCP: [facility]  Admit date: 09/17/2017 Discharge date: 09/19/2017  Admitted From: Home  disposition: Home  Recommendations for Outpatient Follow-up:  1. Follow up with PCP in 1 week 2. Follow-up with pulmonary as an outpatient   Home Health: No Equipment/Devices: None  Discharge Condition: Stable CODE STATUS: Full Diet recommendation: Heart Healthy  Brief/Interim Summary: 67 year old female with history of COPD, anxiety, depression, bipolar disorder presented with worsening shortness of breath and was admitted for COPD exacerbation.  She was treated with intravenous Solu-Medrol.  She was also started on Rocephin and Zithromax for probable pneumonia.  Her condition improved.  She is currently on room air.  She will be discharged home on oral prednisone and oral antibiotics.  Discharge Diagnoses:  Principal Problem:   COPD with acute exacerbation ([place]) Active Problems:   Bipolar disorder, unspecified ([place])   Hyperlipidemia   Depression   Anxiety   COPD exacerbation ([place])   Hypoxia  Hypoxia -From COPD exacerbation and pneumonia -Resolved.  Currently on room air  COPD exacerbation -Improving.  Treated with IV Solu-Medrol and nebs.  Respiratory viral panel is negative.  Discharge home on oral prednisone 40 milligrams daily for 7 days.  Continue other home regimen.  Outpatient follow-up with pulmonary  Probable right lower lobar bacterial pneumonia present on admission -Currently on Rocephin and Zithromax.  Respiratory status has much improved.  Discharged home on oral Ceftin for 5 days  Bipolar disorder -stable.  Continue Lamictal, Risperdal, Seroquel  Depression/anxiety -Continue Effexor.  Outpatient follow-up  Hyperlipidemia -Continue statin  Tobacco abuse -Counseled about tobacco cessation.  Outpatient follow-up   Discharge Instructions  Discharge  Instructions    Call MD for:  difficulty breathing, headache or visual disturbances   Complete by:  As directed    Call MD for:  hives   Complete by:  As directed    Call MD for:  persistant dizziness or light-headedness   Complete by:  As directed    Call MD for:  persistant nausea and vomiting   Complete by:  As directed    Call MD for:  severe uncontrolled pain   Complete by:  As directed    Call MD for:  temperature >100.4   Complete by:  As directed    Diet - low sodium heart healthy   Complete by:  As directed    Increase activity slowly   Complete by:  As directed      Allergies as of 09/19/2017      Reactions   Sulfa Antibiotics Other (See Comments)   Mouth gets raw      Medication List    TAKE these medications   albuterol 108 (90 Base) MCG/ACT inhaler Commonly known as:  PROVENTIL HFA;VENTOLIN HFA Inhale 2 puffs into the lungs every 6 (six) hours as needed for wheezing or shortness of breath.   albuterol (2.5 MG/3ML) 0.083% nebulizer solution Commonly known as:  PROVENTIL Take 3 mLs by nebulization 3 (three) times daily as needed for wheezing or shortness of breath.   ALPRAZolam 0.25 MG tablet Commonly known as:  XANAX Take 0.25 mg by mouth daily as needed for anxiety.   atorvastatin 20 MG tablet Commonly known as:  LIPITOR Take 20 mg by mouth at bedtime.   BREO ELLIPTA 100-25 MCG/INH Aepb Generic drug:  fluticasone furoate-vilanterol Take 1 puff by mouth at bedtime.   cefUROXime 500 MG tablet Commonly known as:  CEFTIN  Take 1 tablet (500 mg total) by mouth 2 (two) times daily for 5 days.   Dexmethylphenidate HCl 40 MG Cp24 Take 40 mg by mouth at bedtime.   guaiFENesin 600 MG 12 hr tablet Commonly known as:  MUCINEX Take 1 tablet (600 mg total) by mouth 2 (two) times daily for 10 days.   lamoTRIgine 200 MG tablet Commonly known as:  LAMICTAL Take 200 mg by mouth at bedtime.   magic mouthwash w/lidocaine Soln Take 5 mLs by mouth 3 (three) times  daily.   nicotine 21 mg/24hr patch Commonly known as:  NICODERM CQ - dosed in mg/24 hours Place 21 mg onto the skin daily. What changed:  Another medication with the same name was removed. Continue taking this medication, and follow the directions you see here.   PEPTO-BISMOL 262 MG Tabs Generic drug:  Bismuth Subsalicylate Take 2 tablets by mouth daily as needed (upset stomach).   predniSONE 20 MG tablet Commonly known as:  DELTASONE Take 2 tablets (40 mg total) by mouth daily for 7 days.   QUEtiapine 25 MG tablet Commonly known as:  SEROQUEL Take 50 mg by mouth at bedtime.   risperiDONE 1 MG tablet Commonly known as:  RISPERDAL Take 3 mg by mouth at bedtime.   venlafaxine XR 150 MG 24 hr capsule Commonly known as:  EFFEXOR-XR Take 150 mg by mouth at bedtime.   Vitamin D (Ergocalciferol) 50000 units Caps capsule Commonly known as:  DRISDOL Take 50,000 Units by mouth once a week. On Saturdays      [place], [facility]. Schedule an appointment as soon as possible for a visit in 1 week(s).   Contact information: [place] [id number] [phone]        Pulmonologist. Schedule an appointment as soon as possible for a visit in 1 week(s).          Allergies  Allergen Reactions  . Sulfa Antibiotics Other (See Comments)    Mouth gets raw    Consultations:  None   Procedures/Studies: Dg Chest 2 View  Result Date: 08/24/2017 CLINICAL DATA:  Shortness of breath EXAM: CHEST - 2 VIEW COMPARISON:  08/23/2017 FINDINGS: The lungs are hyperinflated likely secondary to COPD. There is mild bibasilar atelectasis. There is no focal consolidation. There is no pleural effusion or pneumothorax. The heart and mediastinal contours are unremarkable. The osseous structures are unremarkable. IMPRESSION: No active cardiopulmonary disease. Electronically Signed   By: Kathreen Devoid   On: 08/24/2017 09:34   Ct Angio Chest Pe W/cm &/or Wo  Cm  Result Date: 08/23/2017 CLINICAL DATA:  Shortness of breath over the last month which is worsening. EXAM: CT ANGIOGRAPHY CHEST WITH CONTRAST TECHNIQUE: Multidetector CT imaging of the chest was performed using the standard protocol during bolus administration of intravenous contrast. Multiplanar CT image reconstructions and MIPs were obtained to evaluate the vascular anatomy. CONTRAST:  76mL ISOVUE-370 IOPAMIDOL (ISOVUE-370) INJECTION 76% COMPARISON:  Radiography same day FINDINGS: Cardiovascular: Pulmonary arterial opacification is good. There are no pulmonary emboli. Heart size is normal. The patient does have some coronary artery calcification. There is minimal aortic atherosclerotic calcification. Mediastinum/Nodes: No mass or lymphadenopathy. Lungs/Pleura: No pleural effusion. No underlying emphysematous blebs or bullae. Minimal atelectasis or scar at both lung bases. No sign of infiltrate or lobar collapse. No focal mass or nodule. Upper Abdomen: Negative Musculoskeletal: Chronic thoracic degenerative changes. Review of the MIP images confirms the above findings. IMPRESSION: No pulmonary emboli or  other acute chest pathology. Mild basilar atelectasis and or scarring. Coronary artery calcification. Minimal aortic atherosclerotic calcification. Electronically Signed   By: Nelson Chimes M.D.   On: 08/23/2017 10:42   Dg Chest Port 1 View  Result Date: 09/17/2017 CLINICAL DATA:  Shortness of breath yesterday that worsened overnight associated to kidney a and labored breathing with pursed lips. History of COPD. Former smoker. EXAM: PORTABLE CHEST 1 VIEW COMPARISON:  PA and lateral chest x-ray of August 24, 2017 FINDINGS: The lungs are mildly hyperinflated. The interstitial markings are coarse. I cannot exclude minimal atelectasis or infiltrate at the right base. The heart is normal in size. The pulmonary vascularity is not engorged. The bony thorax is unremarkable. IMPRESSION: COPD with smoking related  changes. Minimal atelectasis or early pneumonia at the right lung base. Followup PA and lateral chest X-ray is recommended in 3-4 weeks following trial of antibiotic therapy to ensure resolution and exclude underlying malignancy. Electronically Signed   By: David  Martinique M.D.   On: 09/17/2017 08:03   Dg Chest Port 1 View  Result Date: 08/23/2017 CLINICAL DATA:  Shortness of breath.  COPD. EXAM: PORTABLE CHEST 1 VIEW COMPARISON:  August 30, 2012 FINDINGS: The heart, hila, and mediastinum are unremarkable given portable technique. No pneumothorax. No nodules or masses. No focal infiltrates. No overt edema. IMPRESSION: No active disease. Electronically Signed   By: Dorise Bullion III M.D   On: 08/23/2017 07:46      Subjective: Patient seen and examined at bedside.  She feels much better.  Her breathing has improved.  She is still intermittently coughing.  No overnight fever, nausea or vomiting.  Discharge Exam: Vitals:   09/19/17 0726 09/19/17 0726  BP:  114/81  Pulse:  83  Resp:    Temp:    SpO2: 99%    Vitals:   09/18/17 2134 09/19/17 0624 09/19/17 0726 09/19/17 0726  BP: 137/77 (!) 152/85  114/81  Pulse: 80 81  83  Resp: 20 20    Temp: 98.4 F (36.9 C) 98.2 F (36.8 C)    TempSrc: Oral Oral    SpO2: 99% 100% 99%   Weight:      Height:        General: Pt is alert, awake, not in acute distress Cardiovascular: rate controlled, S1/S2 + Respiratory: bilateral decreased breath sounds at bases with very minimal scattered wheezing Abdominal: Soft, NT, ND, bowel sounds + Extremities: no edema, no cyanosis    The results of significant diagnostics from this hospitalization (including imaging, microbiology, ancillary and laboratory) are listed below for reference.     Microbiology: Recent Results (from the past 240 hour(s))  Respiratory Panel by PCR     Status: None   Collection Time: 09/18/17  6:09 AM  Result Value Ref Range Status   Adenovirus NOT DETECTED NOT DETECTED  Final   Coronavirus 229E NOT DETECTED NOT DETECTED Final   Coronavirus HKU1 NOT DETECTED NOT DETECTED Final   Coronavirus NL63 NOT DETECTED NOT DETECTED Final   Coronavirus OC43 NOT DETECTED NOT DETECTED Final   Metapneumovirus NOT DETECTED NOT DETECTED Final   Rhinovirus / Enterovirus NOT DETECTED NOT DETECTED Final   Influenza A NOT DETECTED NOT DETECTED Final   Influenza B NOT DETECTED NOT DETECTED Final   Parainfluenza Virus 1 NOT DETECTED NOT DETECTED Final   Parainfluenza Virus 2 NOT DETECTED NOT DETECTED Final   Parainfluenza Virus 3 NOT DETECTED NOT DETECTED Final   Parainfluenza Virus 4 NOT DETECTED NOT DETECTED  Final   Respiratory Syncytial Virus NOT DETECTED NOT DETECTED Final   Bordetella pertussis NOT DETECTED NOT DETECTED Final   Chlamydophila pneumoniae NOT DETECTED NOT DETECTED Final   Mycoplasma pneumoniae NOT DETECTED NOT DETECTED Final    Comment: Performed at [facility], [place] [address] [place], [place] [address]     Labs: BNP (last 3 results) Recent Labs    08/23/17 1206  BNP 54.2   Basic Metabolic Panel: Recent Labs  Lab 09/17/17 0302 09/18/17 0555 09/19/17 0342  NA 139 140 138  K 4.6 4.6 4.5  CL 103 105 102  CO2 24 26 28   GLUCOSE 103* 127* 94  BUN 10 11 13   CREATININE 0.75 0.65 0.67  CALCIUM 10.3 10.1 9.9   Liver Function Tests: No results for input(s): AST, ALT, ALKPHOS, BILITOT, PROT, ALBUMIN in the last 168 hours. No results for input(s): LIPASE, AMYLASE in the last 168 hours. No results for input(s): AMMONIA in the last 168 hours. CBC: Recent Labs  Lab 09/17/17 0302 09/18/17 0555 09/19/17 0342  WBC 9.8 13.9* 11.8*  NEUTROABS  --   --  7.6  HGB 13.6 13.5 13.2  HCT 41.3 40.8 40.3  MCV 103.0* 103.0* 103.1*  PLT 349 356 347   Cardiac Enzymes: No results for input(s): CKTOTAL, CKMB, CKMBINDEX, TROPONINI in the last 168 hours. BNP: Invalid input(s): POCBNP CBG: No results for input(s): GLUCAP in the last 168  hours. D-Dimer No results for input(s): DDIMER in the last 72 hours. Hgb A1c No results for input(s): HGBA1C in the last 72 hours. Lipid Profile No results for input(s): CHOL, HDL, LDLCALC, TRIG, CHOLHDL, LDLDIRECT in the last 72 hours. Thyroid function studies No results for input(s): TSH, T4TOTAL, T3FREE, THYROIDAB in the last 72 hours.  Invalid input(s): FREET3 Anemia work up No results for input(s): VITAMINB12, FOLATE, FERRITIN, TIBC, IRON, RETICCTPCT in the last 72 hours. Urinalysis    Component Value Date/Time   BILIRUBINUR n 09/23/2012 1111   PROTEINUR n 09/23/2012 1111   UROBILINOGEN 0.2 09/23/2012 1111   NITRITE n 09/23/2012 1111   LEUKOCYTESUR Negative 09/23/2012 1111   Sepsis Labs Invalid input(s): PROCALCITONIN,  WBC,  LACTICIDVEN Microbiology Recent Results (from the past 240 hour(s))  Respiratory Panel by PCR     Status: None   Collection Time: 09/18/17  6:09 AM  Result Value Ref Range Status   Adenovirus NOT DETECTED NOT DETECTED Final   Coronavirus 229E NOT DETECTED NOT DETECTED Final   Coronavirus HKU1 NOT DETECTED NOT DETECTED Final   Coronavirus NL63 NOT DETECTED NOT DETECTED Final   Coronavirus OC43 NOT DETECTED NOT DETECTED Final   Metapneumovirus NOT DETECTED NOT DETECTED Final   Rhinovirus / Enterovirus NOT DETECTED NOT DETECTED Final   Influenza A NOT DETECTED NOT DETECTED Final   Influenza B NOT DETECTED NOT DETECTED Final   Parainfluenza Virus 1 NOT DETECTED NOT DETECTED Final   Parainfluenza Virus 2 NOT DETECTED NOT DETECTED Final   Parainfluenza Virus 3 NOT DETECTED NOT DETECTED Final   Parainfluenza Virus 4 NOT DETECTED NOT DETECTED Final   Respiratory Syncytial Virus NOT DETECTED NOT DETECTED Final   Bordetella pertussis NOT DETECTED NOT DETECTED Final   Chlamydophila pneumoniae NOT DETECTED NOT DETECTED Final   Mycoplasma pneumoniae NOT DETECTED NOT DETECTED Final    Comment: Performed at [facility], [place] [address]  [place], [place] [address]     Time coordinating discharge: 35 minutes  SIGNED:   Aline August, MD  [facility] 09/19/2017, 10:18 AM Pager: [phone]  If 7PM-7AM, please contact night-coverage [email or web address] Password [email or web address]

## 2017-09-19 NOTE — Progress Notes (Signed)
Nursing Discharge Summary  Patient ID: Julia Daniels MRN: [id number] DOB/AGE: 1950/07/07 67 y.o.  Admit date: 09/17/2017 Discharge date: 09/19/2017  Discharged Condition: good  Disposition: Discharge disposition: 01-Home or [place]. Schedule an appointment as soon as possible for a visit in 1 week(s).   Contact information: [place] [phone]        Pulmonologist. Schedule an appointment as soon as possible for a visit in 1 week(s).           Prescriptions Given: Prescriptions called into pharmacy for patient to be picked up.  Patient follow up appointments and medications discussed with patient and she verbalized understanding without further questions.   Means of Discharge: Patient to be taken downstairs via wheelchair to be discharged home.   Signed: Buel Ream 09/19/2017, 11:48 AM

## 2017-10-31 ENCOUNTER — Other Ambulatory Visit (INDEPENDENT_AMBULATORY_CARE_PROVIDER_SITE_OTHER): Payer: BLUE CROSS/BLUE SHIELD

## 2017-10-31 ENCOUNTER — Encounter: Payer: Self-pay | Admitting: Internal Medicine

## 2017-10-31 ENCOUNTER — Ambulatory Visit (INDEPENDENT_AMBULATORY_CARE_PROVIDER_SITE_OTHER): Payer: BLUE CROSS/BLUE SHIELD | Admitting: Internal Medicine

## 2017-10-31 VITALS — BP 118/70 | HR 83 | Ht 63.0 in | Wt 187.0 lb

## 2017-10-31 DIAGNOSIS — J449 Chronic obstructive pulmonary disease, unspecified: Secondary | ICD-10-CM | POA: Diagnosis not present

## 2017-10-31 LAB — CBC WITH DIFFERENTIAL/PLATELET
BASOS ABS: 0 10*3/uL (ref 0.0–0.1)
BASOS PCT: 0.4 % (ref 0.0–3.0)
Eosinophils Absolute: 0.9 10*3/uL — ABNORMAL HIGH (ref 0.0–0.7)
Eosinophils Relative: 10.3 % — ABNORMAL HIGH (ref 0.0–5.0)
HCT: 39.4 % (ref 36.0–46.0)
HEMOGLOBIN: 13.4 g/dL (ref 12.0–15.0)
Lymphocytes Relative: 34.3 % (ref 12.0–46.0)
Lymphs Abs: 3.2 10*3/uL (ref 0.7–4.0)
MCHC: 34.1 g/dL (ref 30.0–36.0)
MCV: 99.6 fl (ref 78.0–100.0)
Monocytes Absolute: 0.9 10*3/uL (ref 0.1–1.0)
Monocytes Relative: 9.7 % (ref 3.0–12.0)
Neutro Abs: 4.2 10*3/uL (ref 1.4–7.7)
Neutrophils Relative %: 45.3 % (ref 43.0–77.0)
Platelets: 347 10*3/uL (ref 150.0–400.0)
RBC: 3.95 Mil/uL (ref 3.87–5.11)
RDW: 13.5 % (ref 11.5–15.5)
WBC: 9.2 10*3/uL (ref 4.0–10.5)

## 2017-10-31 MED ORDER — BUDESONIDE-FORMOTEROL FUMARATE 160-4.5 MCG/ACT IN AERO: 2.0000 | INHALATION_SPRAY | Freq: Two times a day (BID) | RESPIRATORY_TRACT | 11 refills | Status: DC

## 2017-10-31 MED ORDER — PREDNISONE 10 MG PO TABS: ORAL_TABLET | ORAL | 2 refills | Status: DC

## 2017-10-31 NOTE — Patient Instructions (Addendum)
Plan A = Automatic = symbicort 160 Take 2 puffs first thing in am and then another 2 puffs about 12 hours later.    Work on inhaler technique:  relax and gently blow all the way out then take a nice smooth deep breath back in, triggering the inhaler at same time you start breathing in.  Hold for up to 5 seconds if you can. Blow out thru nose. Rinse and gargle with water when done     Plan B = Backup Only use your albuterol as a rescue medication to be used if you can't catch your breath by walking slower, resting or doing a relaxed purse lip breathing pattern.  - The less you use it, the better it will work when you need it. - Ok to use the inhaler up to 2 puffs  every 4 hours if you must but call for appointment if use goes up over your usual need - Don't leave home without it !!  (think of it like the spare tire for your car)   Plan C = Crisis - only use your albuterol nebulizer if you first try Plan B and it fails to help > ok to use the nebulizer up to every 4 hours but if start needing it regularly call for immediate appointment   Plan D = Deltasone  If worse despite A thru C >  Try Deltasone = Prednisone 10 mg take  4 each am x 2 days,   2 each am x 2 days,  1 each am x 2 days and stop      Please remember to go to the lab department downstairs in the basement  for your tests - we will call you with the results when they are available.      Please schedule a follow up office visit in 4 weeks, sooner if needed  with all medications /inhalers/ solutions in hand so we can verify exactly what you are taking. This includes all medications from all doctors and over the counters

## 2017-10-31 NOTE — Progress Notes (Signed)
Julia Daniels, female    DOB: 26-Nov-1950, 67 y.o.   MRN: [id number]    Brief patient profile:  67 yowf quit smoking 08/23/17  With indolent  onset around 2014 doe gradually worse and started [facility] by [place] 2017 then admit wlh x 2 late summer 2019 with dx of aecopd  And since then highly variable sob So self- referred to pulmonary clinic 10/31/2017 with GOLD III criteria for copd on initial eval     History of Present Illness  10/31/2017  Pulmonary/ 1st office eval/ Julia Daniels Chief Complaint  Patient presents with  . Pulmonary Consult    Self referral for COPD. Pt was dxed with COPD 2 21yrs ago. She has been seen in the past by Dr Verdie Mosher. She states she gets tired easily. She gets winded with carrying something heavy and some days with minimal walking. She uses proair once daily on average and neb with albuterol 3-4 x per wk.   Dyspnea:  Best days doe = MMRC3 = can't walk 100 yards even at a slow pace at a flat grade s stopping due to sob  / better p a few hours but highly variable s pattern  Cough: better since stopped smoking Sleep: bed is flat/ 2 pillows / no am flare  SABA use: neb helps the most   No obvious day to day or daytime variability or assoc excess/ purulent sputum or mucus plugs or hemoptysis or cp or chest tightness, subjective wheeze or overt sinus or hb symptoms.   Sleeping ok as above  without nocturnal  or early am exacerbation  of respiratory  c/o's or need for noct saba. Also denies any obvious fluctuation of symptoms with weather or environmental changes or other aggravating or alleviating factors except as outlined above   No unusual exposure hx or h/o childhood pna/ asthma or knowledge of premature birth.  Current Allergies, Complete Past Medical History, Past Surgical History, Family History, and Social History were reviewed in [employer] record.  ROS  The following are not active complaints unless bolded Hoarseness, sore throat,  dysphagia, dental problems, itching, sneezing,  nasal congestion or discharge of excess mucus or purulent secretions, ear ache,   fever, chills, sweats, unintended wt loss or wt gain, classically pleuritic or exertional cp,  orthopnea pnd or arm/hand swelling  or leg swelling, presyncope, palpitations, abdominal pain, anorexia, nausea, vomiting, diarrhea  or change in bowel habits or change in bladder habits, change in stools or change in urine, dysuria, hematuria,  rash, arthralgias, visual complaints, headache, numbness, weakness or ataxia or problems with walking or coordination,  change in mood or  memory.           Past Medical History:  Diagnosis Date  . Anxiety   . COPD (chronic obstructive pulmonary disease) ([place])   . Depression     Outpatient Medications Prior to Visit  Medication Sig Dispense Refill  . albuterol (PROVENTIL HFA;VENTOLIN HFA) 108 (90 BASE) MCG/ACT inhaler Inhale 2 puffs into the lungs every 6 (six) hours as needed for wheezing or shortness of breath. 1 Inhaler 0  . albuterol (PROVENTIL) (2.5 MG/3ML) 0.083% nebulizer solution Take 3 mLs by nebulization 3 (three) times daily as needed for wheezing or shortness of breath.   1  . ALPRAZolam (XANAX) 0.25 MG tablet Take 0.25 mg by mouth daily as needed for anxiety.   1  . atorvastatin (LIPITOR) 20 MG tablet Take 20 mg by mouth at bedtime.  3  .  Bismuth Subsalicylate (PEPTO-BISMOL) 262 MG TABS Take 2 tablets by mouth daily as needed (upset stomach).    Marland Kitchen BREO ELLIPTA 100-25 MCG/INH AEPB Take 1 puff by mouth at bedtime.  2  . Dexmethylphenidate HCl 40 MG CP24 Take 40 mg by mouth at bedtime.  0  . lamoTRIgine (LAMICTAL) 200 MG tablet Take 200 mg by mouth at bedtime.     . magic mouthwash w/lidocaine SOLN Take 5 mLs by mouth 3 (three) times daily.  2  . nicotine (NICODERM CQ - DOSED IN MG/24 HOURS) 21 mg/24hr patch Place 21 mg onto the skin daily.    . QUEtiapine (SEROQUEL) 25 MG tablet Take 50 mg by mouth at bedtime.    .  risperiDONE (RISPERDAL) 1 MG tablet Take 3 mg by mouth at bedtime.   3  . venlafaxine XR (EFFEXOR-XR) 150 MG 24 hr capsule Take 150 mg by mouth at bedtime.     . Vitamin D, Ergocalciferol, (DRISDOL) 50000 units CAPS capsule Take 50,000 Units by mouth once a week. On Saturdays  0            Objective:     BP 118/70 (BP Location: Left Arm, Cuff Size: Normal)   Pulse 83   Ht 5\' 3"  (1.6 m)   Wt 187 lb (84.8 kg)   SpO2 94%   BMI 33.13 kg/m   SpO2: 94 %  RA  Pleasant amb mod obese wf nad    HEENT: full dentures/ nl  oropharynx. Nl external ear canals without cough reflex -  Mild bilateral non-specific turbinate edema     NECK :  without JVD/Nodes/TM/ nl carotid upstrokes bilaterally   LUNGS: no acc muscle use,  Mild barrel  contour chest wall with bilateral Exp> insp rhonchi  and  without cough on insp or exp maneuver and mild  Hyperresonant  to  percussion bilaterally     CV:  RRR  no s3 or murmur or increase in P2, and no edema   ABD:  Obese soft and nontender with pos mid/late insp Hoover's  in the supine position. No bruits or organomegaly appreciated, bowel sounds nl  MS:   Nl gait/  ext warm without deformities, calf tenderness, cyanosis or clubbing No obvious joint restrictions   SKIN: warm and dry without lesions    NEURO:  alert, approp, nl sensorium with  no motor or cerebellar deficits apparent.        I personally reviewed images and agree with radiology impression as follows:  CXR:   09/17/17  COPD with smoking related changes. Minimal atelectasis or early pneumonia at the right lung base. Followup PA and lateral chest X-ray is recommended in 3-4 weeks following trial of antibiotic therapy to ensure resolution and exclude underlying malignancy.   Lab Results  Component Value Date   WBC 9.2 10/31/2017   HGB 13.4 10/31/2017   HCT 39.4 10/31/2017   MCV 99.6 10/31/2017   PLT 347.0 10/31/2017       EOS                                                                0.9  10/31/2017    Labs ordered 10/31/2017    Alpha one level      Assessment   COPD  GOLD ? 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   Also needs to learn to walk at slower pace, good candidate for rehab if can work it out with present schedule.  Needs alpha one testing and   maintain off cigs and f/u with pfts in 4 weeks with all meds in hand using a trust but verify approach to confirm accurate Medication  Reconciliation The principal here is that until we are certain that the  patients are doing what we've asked, it makes no sense to ask them to do more.     Total time devoted to counseling  > 50 % of initial 60 min office visit:  review case with pt/ discussion of options/alternatives/ personally creating written customized instructions  in presence of pt  then going over those specific  Instructions directly with the pt including how to use all of the meds but in particular covering each new medication in detail and the difference between the maintenance= "automatic" meds and the prns using an action plan format for the latter (If this problem/symptom => do that organization reading Left to right).  Please see AVS from this visit for a full list of these instructions which I personally wrote for this pt and  are unique to this visit.  See device teaching which extended face to face time for this visit      Christinia Gully,  MD 10/31/2017

## 2017-11-01 ENCOUNTER — Encounter: Payer: Self-pay | Admitting: Internal Medicine

## 2017-11-01 NOTE — Assessment & Plan Note (Addendum)
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   Also needs to learn to walk at slower pace, good candidate for rehab if can work it out with present schedule.  Needs alpha one testing and   maintain off cigs and f/u with pfts in 4 weeks with all meds in hand using a trust but verify approach to confirm accurate Medication  Reconciliation The principal here is that until we are certain that the  patients are doing what we've asked, it makes no sense to ask them to do more.     Total time devoted to counseling  > 50 % of initial 60 min office visit:  review case with pt/ discussion of options/alternatives/ personally creating written customized instructions  in presence of pt  then going over those specific  Instructions directly with the pt including how to use all of the meds but in particular covering each new medication in detail and the difference between the maintenance= "automatic" meds and the prns using an action plan format for the latter (If this problem/symptom => do that organization reading Left to right).  Please see AVS from this visit for a full list of these instructions which I personally wrote for this pt and  are unique to this visit.  See device teaching which extended face to face time for this visit

## 2017-11-03 LAB — ALPHA-1 ANTITRYPSIN PHENOTYPE: A1 ANTITRYPSIN SER: 121 mg/dL (ref 83–199)

## 2017-11-05 ENCOUNTER — Telehealth: Payer: Self-pay | Admitting: Internal Medicine

## 2017-11-05 NOTE — Telephone Encounter (Signed)
LMTCB. Patient needs F/U appt moved to first available with MW.

## 2017-11-05 NOTE — Telephone Encounter (Signed)
Move up f/u to next avail with all active meds in hand

## 2017-11-05 NOTE — Telephone Encounter (Signed)
Notes recorded by Tanda Rockers, MD on 11/04/2017 at 7:35 AM EDT Call patient : Study is unremarkable, no change in recs ----------------  Spoke with pt, aware of results/recs. While on the phone pt noted that she has not had any change in her dyspnea since starting Symbicort on 10/23.  Pt wants to make MW aware, and see if he has any other recs for her.  MW please advise.  Thanks.

## 2017-11-05 NOTE — Progress Notes (Signed)
LMTCB

## 2017-11-07 NOTE — Telephone Encounter (Signed)
Pt returned call. Pt sched for f/up apt 10/31 with Dr. Melvyn Novas.

## 2017-11-07 NOTE — Telephone Encounter (Signed)
Nothing further need. At this time.

## 2017-11-08 ENCOUNTER — Encounter: Payer: Self-pay | Admitting: Internal Medicine

## 2017-11-08 ENCOUNTER — Ambulatory Visit (INDEPENDENT_AMBULATORY_CARE_PROVIDER_SITE_OTHER): Payer: BLUE CROSS/BLUE SHIELD | Admitting: Internal Medicine

## 2017-11-08 ENCOUNTER — Ambulatory Visit (INDEPENDENT_AMBULATORY_CARE_PROVIDER_SITE_OTHER)
Admission: RE | Admit: 2017-11-08 | Discharge: 2017-11-08 | Disposition: A | Discharge status: Home / Self Care | Payer: BLUE CROSS/BLUE SHIELD | Source: Ambulatory Visit | Attending: Internal Medicine | Admitting: Internal Medicine

## 2017-11-08 DIAGNOSIS — J449 Chronic obstructive pulmonary disease, unspecified: Secondary | ICD-10-CM | POA: Diagnosis not present

## 2017-11-08 NOTE — Progress Notes (Signed)
LMTCB

## 2017-11-08 NOTE — Patient Instructions (Addendum)
Go ahead and take the prednisone you have on hand   Work on inhaler technique:  relax and gently blow all the way out then take a nice smooth deep breath back in, triggering the inhaler at same time you start breathing in.  Hold for up to 5 seconds if you can. Blow out thru nose. Rinse and gargle with water when done    .Only use your albuterol as a rescue medication to be used if you can't catch your breath by resting or doing a relaxed purse lip breathing pattern.  - The less you use it, the better it will work when you need it. - Ok to use up to 2 puffs  every 4 hours if you must but call for immediate appointment if use goes up over your usual need - Don't leave home without it !!  (think of it like the spare tire for your car)    Please remember to go to the  x-ray department downstairs in the basement  for your tests - we will call you with the results when they are available.       Keep appt you previously made - call sooner if needed

## 2017-11-08 NOTE — Progress Notes (Signed)
Julia Daniels, female    DOB: 1950/01/14, .   MRN: [id number]     Brief patient profile:  67 yowf quit smoking 08/23/17  With indolent  onset around 2014 doe gradually worse and started [facility] by [place] 2017 then admit wlh x 2 late summer 2019 with dx of aecopd  And since then highly variable sob So self- referred to pulmonary clinic 10/31/2017 with GOLD III criteria for copd on initial eval     History of Present Illness  10/31/2017  Pulmonary/ 1st office eval/ Ladonna Vanorder Chief Complaint  Patient presents with  . Pulmonary Consult    Self referral for COPD. Pt was dxed with COPD 2 82yrs ago. She has been seen in the past by Dr Verdie Mosher. She states she gets tired easily. She gets winded with carrying something heavy and some days with minimal walking. She uses proair once daily on average and neb with albuterol 3-4 x per wk.   Dyspnea:  Best days doe = MMRC3 = can't walk 100 yards even at a slow pace at a flat grade s stopping due to sob  / better p a few hours but highly variable s pattern  Cough: better since stopped smoking Sleep: bed is flat/ 2 pillows / no am flare  SABA use: neb helps the most  rec Plan A = Automatic = symbicort 160 Take 2 puffs first thing in am and then another 2 puffs about 12 hours later.  Work on inhaler technique:  Plan B = Backup Only use your albuterol as a rescue medication Plan C = Crisis - only use your albuterol nebulizer if you first try Plan B and it fails to help  Plan D = Deltasone  If worse despite A thru C >  Try Deltasone = Prednisone 10 mg take  4 each am x 2 days,   2 each am x 2 days,  1 each am x 2 days and stop       11/08/2017  f/u ov/Tilla Wilborn re:  GOLD III / no better on symb 160 2bid  Chief Complaint  Patient presents with  . Follow-up    c/o sob with exertion, using rescue inhaler daily.   Dyspnea:  Not really much better, never used prednisone as rec  Cough: no Sleeping: bed flat 2 pillows  SABA use: about twice daily hfa and neb    02: none   No obvious day to day or daytime variability or assoc excess/ purulent sputum or mucus plugs or hemoptysis or cp or chest tightness, subjective wheeze or overt sinus or hb symptoms.   Sleeping as above  without nocturnal  or early am exacerbation  of respiratory  c/o's or need for noct saba. Also denies any obvious fluctuation of symptoms with weather or environmental changes or other aggravating or alleviating factors except as outlined above   No unusual exposure hx or h/o childhood pna/ asthma or knowledge of premature birth.  Current Allergies, Complete Past Medical History, Past Surgical History, Family History, and Social History were reviewed in [employer] record.  ROS  The following are not active complaints unless bolded Hoarseness, sore throat, dysphagia, dental problems, itching, sneezing,  nasal congestion or discharge of excess mucus or purulent secretions, ear ache,   fever, chills, sweats, unintended wt loss or wt gain, classically pleuritic or exertional cp,  orthopnea pnd or arm/hand swelling  or leg swelling, presyncope, palpitations, abdominal pain, anorexia, nausea, vomiting, diarrhea  or change in  bowel habits or change in bladder habits, change in stools or change in urine, dysuria, hematuria,  rash, arthralgias, visual complaints, headache, numbness, weakness or ataxia or problems with walking or coordination,  change in mood or  memory.        Current Meds  Medication Sig  . albuterol (PROVENTIL HFA;VENTOLIN HFA) 108 (90 BASE) MCG/ACT inhaler Inhale 2 puffs into the lungs every 6 (six) hours as needed for wheezing or shortness of breath.  Marland Kitchen albuterol (PROVENTIL) (2.5 MG/3ML) 0.083% nebulizer solution Take 3 mLs by nebulization 3 (three) times daily as needed for wheezing or shortness of breath.   . ALPRAZolam (XANAX) 0.25 MG tablet Take 0.25 mg by mouth daily as needed for anxiety.   Marland Kitchen atorvastatin (LIPITOR) 20 MG tablet Take 20 mg by  mouth at bedtime.  . Bismuth Subsalicylate (PEPTO-BISMOL) 262 MG TABS Take 2 tablets by mouth daily as needed (upset stomach).  . budesonide-formoterol (SYMBICORT) 160-4.5 MCG/ACT inhaler Inhale 2 puffs into the lungs 2 (two) times daily.  Marland Kitchen Dexmethylphenidate HCl 40 MG CP24 Take 40 mg by mouth at bedtime.  . lamoTRIgine (LAMICTAL) 200 MG tablet Take 200 mg by mouth at bedtime.   . magic mouthwash w/lidocaine SOLN Take 5 mLs by mouth 3 (three) times daily.  . nicotine (NICODERM CQ - DOSED IN MG/24 HOURS) 21 mg/24hr patch Place 21 mg onto the skin daily.  . QUEtiapine (SEROQUEL) 25 MG tablet Take 50 mg by mouth at bedtime.  . risperiDONE (RISPERDAL) 1 MG tablet Take 3 mg by mouth at bedtime.   Marland Kitchen venlafaxine XR (EFFEXOR-XR) 150 MG 24 hr capsule Take 150 mg by mouth at bedtime.   . Vitamin D, Ergocalciferol, (DRISDOL) 50000 units CAPS capsule Take 50,000 Units by mouth once a week. On Saturdays                       Objective:     Talkative somewhat hyperkinetic wf nad  Wt Readings from Last 3 Encounters:  11/08/17 189 lb (85.7 kg)  10/31/17 187 lb (84.8 kg)  09/18/17 179 lb 0.2 oz (81.2 kg)     Vital signs reviewed - Note on arrival 02 sats  92% on RA       HEENT: nl dentition / oropharynx. Nl external ear canals without cough reflex -  Mild bilateral non-specific turbinate edema     NECK :  without JVD/Nodes/TM/ nl carotid upstrokes bilaterally   LUNGS: no acc muscle use,  Mild barrel  contour chest wall with bilateral mid exp rhonchi   without cough on insp or exp maneuver and mild  Hyperresonant  to  percussion bilaterally     CV:  RRR  no s3 or murmur or increase in P2, and no edema   ABD:  soft and nontender with pos end  insp Hoover's  in the supine position. No bruits or organomegaly appreciated, bowel sounds nl  MS:   Nl gait/  ext warm without deformities, calf tenderness, cyanosis or clubbing No obvious joint restrictions   SKIN: warm and dry without lesions     NEURO:  alert, approp, nl sensorium with  no motor or cerebellar deficits apparent.          CXR PA and Lateral:   11/08/2017 :    I personally reviewed images and agree with radiology impression as follows:    1. Stable bibasilar linear scarring.  No active process. 2. Some peribronchial thickening may indicate bronchitis, possibly chronic  in this patient with a smoking history previously.        Assessment

## 2017-11-11 ENCOUNTER — Encounter: Payer: Self-pay | Admitting: Internal Medicine

## 2017-11-11 NOTE — Assessment & Plan Note (Addendum)
Spirometry 10/31/2017  FEV1 0.7 (30%)  Ratio 51 p Breo in am   - 10/31/2017  Eos = 0.9  - alpha one AT screen 10/31/2017   MS level 121  - 10/31/2017   Walked RA x one lap @ 185 stopped due to sob s desats at fast pace   - 11/08/2017  After extensive coaching inhaler device,  effectiveness =    75% (short Ti)   Appears to have active AB component and suboptima hfa so rec  Prednisone 10 mg take  4 each am x 2 days,   2 each am x 2 days,  1 each am x 2 days and stop  Work on hfa  F/u as prev planned       I had an extended discussion with the patient reviewing all relevant studies completed to date and  lasting 15 to 20 minutes of a 25 minute visit    I spent extra time with pt today reviewing appropriate use of albuterol for prn use on exertion with the following points: 1) saba is for relief of sob that does not improve by walking a slower pace or resting but rather if the pt does not improve after trying this first. 2) If the pt is convinced, as many are, that saba helps recover from activity faster then it's easy to tell if this is the case by re-challenging : ie stop, take the inhaler, then p 5 minutes try the exact same activity (intensity of workload) that just caused the symptoms and see if they are substantially diminished or not after saba 3) if there is an activity that reproducibly causes the symptoms, try the saba 15 min before the activity on alternate days   If in fact the saba really does help, then fine to continue to use it prn but advised may need to look closer at the maintenance regimen being used to achieve better control of airways disease with exertion.   See device teaching which extended face to face time for this visit.  Each maintenance medication was reviewed in detail including emphasizing most importantly the difference between maintenance and prns and under what circumstances the prns are to be triggered using an action plan format that is not reflected in the  computer generated alphabetically organized AVS which I have not found useful in most complex patients, especially with respiratory illnesses  Please see AVS for specific instructions unique to this visit that I personally wrote and verbalized to the the pt in detail and then reviewed with pt  by my nurse highlighting any  changes in therapy recommended at today's visit to their plan of care.

## 2017-11-12 ENCOUNTER — Telehealth: Payer: Self-pay | Admitting: Internal Medicine

## 2017-11-12 NOTE — Telephone Encounter (Signed)
Called and spoke with patient she is aware of results and verbalized understanding. Nothing further needed.  

## 2017-12-04 ENCOUNTER — Ambulatory Visit (INDEPENDENT_AMBULATORY_CARE_PROVIDER_SITE_OTHER): Payer: BLUE CROSS/BLUE SHIELD | Admitting: Internal Medicine

## 2017-12-04 ENCOUNTER — Encounter: Payer: Self-pay | Admitting: Internal Medicine

## 2017-12-04 VITALS — BP 130/90 | HR 83 | Ht 63.0 in | Wt 191.4 lb

## 2017-12-04 DIAGNOSIS — J449 Chronic obstructive pulmonary disease, unspecified: Secondary | ICD-10-CM

## 2017-12-04 NOTE — Patient Instructions (Addendum)
Work on inhaler technique:  relax and gently blow all the way out then take a nice smooth deep breath back in, triggering the inhaler at same time you start breathing in.  Hold for up to 5 seconds if you can. Blow symbicort out thru nose. Rinse and gargle with water when done      Please schedule a follow up visit in 3 months but call sooner if needed  PFT's on return

## 2017-12-04 NOTE — Progress Notes (Signed)
Julia Daniels, female    DOB: [birth year], .   MRN: [id number]     Brief patient profile:  67 yowf quit smoking 08/23/17  With indolent  onset around 2014 doe gradually worse and started [facility] by [place] 2017 then admit [facility] x 2 late summer 2019 with dx of aecopd  And since then highly variable sob So self- referred to pulmonary clinic 10/31/2017 with GOLD III criteria for copd on initial eval     History of Present Illness  10/31/2017  Pulmonary/ 1st office eval/ [place] Chief Complaint  Patient presents with  . Pulmonary Consult    Self referral for COPD. Pt was dxed with COPD 2 7yrs ago. She has been seen in the past by Dr Verdie Mosher. She states she gets tired easily. She gets winded with carrying something heavy and some days with minimal walking. She uses proair once daily on average and neb with albuterol 3-4 x per wk.   Dyspnea:  Best days doe = MMRC3 = can't walk 100 yards even at a slow pace at a flat grade s stopping due to sob  / better p a few hours but highly variable s pattern  Cough: better since stopped smoking Sleep: bed is flat/ 2 pillows / no am flare  SABA use: neb helps the most  rec Plan A = Automatic = symbicort 160 Take 2 puffs first thing in am and then another 2 puffs about 12 hours later.  Work on inhaler technique:  Plan B = Backup Only use your albuterol as a rescue medication Plan C = Crisis - only use your albuterol nebulizer if you first try Plan B and it fails to help  Plan D = Deltasone  If worse despite A thru C >  Try Deltasone = Prednisone 10 mg take  4 each am x 2 days,   2 each am x 2 days,  1 each am x 2 days and stop       11/08/2017  f/u ov/Quentin Strebel re:  GOLD III / no better on symb 160 2bid  Chief Complaint  Patient presents with  . Follow-up    c/o sob with exertion, using rescue inhaler daily.   Dyspnea:  Not really much better, never used prednisone as rec  Cough: no Sleeping: bed flat 2 pillows  SABA use: about twice daily hfa and neb    02: none  rec Go ahead and take the prednisone you have on hand> a lot better and maintained on symb 160 2bid   Work on inhaler technique:  Only use your albuterol as a rescue medication   12/04/2017  f/u ov/Cordae Mccarey copd IIII  maint symb 160 2bid  Chief Complaint  Patient presents with  . Follow-up    Breathing is much improved since the last visit. She has not used her albuterol inhaler or neb.    Dyspnea:  MMRC2 = can't walk a nl pace on a flat grade s sob but does fine slow and flat  Cough: better Sleeping: ok bed flat 2 pillows  SABA use: min 02: none    No obvious day to day or daytime variability or assoc excess/ purulent sputum or mucus plugs or hemoptysis or cp or chest tightness, subjective wheeze or overt sinus or hb symptoms.   Sleeping as above without nocturnal  or early am exacerbation  of respiratory  c/o's or need for noct saba. Also denies any obvious fluctuation of symptoms with weather or environmental changes or  other aggravating or alleviating factors except as outlined above   No unusual exposure hx or h/o childhood pna/ asthma or knowledge of premature birth.  Current Allergies, Complete Past Medical History, Past Surgical History, Family History, and Social History were reviewed in [employer] record.  ROS  The following are not active complaints unless bolded Hoarseness, sore throat, dysphagia, dental problems, itching, sneezing,  nasal congestion or discharge of excess mucus or purulent secretions, ear ache,   fever, chills, sweats, unintended wt loss or wt gain, classically pleuritic or exertional cp,  orthopnea pnd or arm/hand swelling  or leg swelling, presyncope, palpitations, abdominal pain, anorexia, nausea, vomiting, diarrhea  or change in bowel habits or change in bladder habits, change in stools or change in urine, dysuria, hematuria,  rash, arthralgias, visual complaints, headache, numbness, weakness or ataxia or problems with  walking or coordination,  change in mood or  memory.        Current Meds  Medication Sig  . albuterol (PROVENTIL HFA;VENTOLIN HFA) 108 (90 BASE) MCG/ACT inhaler Inhale 2 puffs into the lungs every 6 (six) hours as needed for wheezing or shortness of breath.  Marland Kitchen albuterol (PROVENTIL) (2.5 MG/3ML) 0.083% nebulizer solution Take 3 mLs by nebulization 3 (three) times daily as needed for wheezing or shortness of breath.   . ALPRAZolam (XANAX) 0.25 MG tablet Take 0.25 mg by mouth daily as needed for anxiety.   Marland Kitchen atorvastatin (LIPITOR) 20 MG tablet Take 20 mg by mouth at bedtime.  . Bismuth Subsalicylate (PEPTO-BISMOL) 262 MG TABS Take 2 tablets by mouth daily as needed (upset stomach).  . budesonide-formoterol (SYMBICORT) 160-4.5 MCG/ACT inhaler Inhale 2 puffs into the lungs 2 (two) times daily.  Marland Kitchen Dexmethylphenidate HCl 40 MG CP24 Take 40 mg by mouth at bedtime.  . lamoTRIgine (LAMICTAL) 200 MG tablet Take 200 mg by mouth at bedtime.   . magic mouthwash w/lidocaine SOLN Take 5 mLs by mouth 3 (three) times daily.  . nicotine (NICODERM CQ - DOSED IN MG/24 HOURS) 21 mg/24hr patch Place 21 mg onto the skin daily.  . QUEtiapine (SEROQUEL) 25 MG tablet Take 50 mg by mouth at bedtime.  . risperiDONE (RISPERDAL) 1 MG tablet Take 3 mg by mouth at bedtime.   Marland Kitchen venlafaxine XR (EFFEXOR-XR) 150 MG 24 hr capsule Take 150 mg by mouth at bedtime.   . Vitamin D, Ergocalciferol, (DRISDOL) 50000 units CAPS capsule Take 50,000 Units by mouth once a week. On Saturdays           Objective:      pleasant obese wf quite verbose   12/04/2017    191   11/08/17 189 lb (85.7 kg)  10/31/17 187 lb (84.8 kg)  09/18/17 179 lb 0.2 oz (81.2 kg)     Vital signs reviewed - Note on arrival 02 sats  93% on RA     HEENT: full dentures/  Nl oropharynx. Nl external ear canals without cough reflex -  Mild bilateral non-specific turbinate edema     NECK :  without JVD/Nodes/TM/ nl carotid upstrokes bilaterally   LUNGS: no  acc muscle use,  Midl barrel  contour chest wall with bilateral  Distant bs s audible wheeze and  without cough on insp or exp maneuver and mild  Hyperresonant  to  percussion bilaterally     CV:  RRR  no s3 or murmur or increase in P2, and no edema   ABD:  soft and nontender with pos late  insp Hoover's  in the supine position. No bruits or organomegaly appreciated, bowel sounds nl  MS:   Nl gait/  ext warm without deformities, calf tenderness, cyanosis or clubbing No obvious joint restrictions   SKIN: warm and dry without lesions    NEURO:  alert, approp, nl sensorium with  no motor or cerebellar deficits apparent.            Assessment

## 2017-12-07 ENCOUNTER — Encounter: Payer: Self-pay | Admitting: Internal Medicine

## 2017-12-07 NOTE — Assessment & Plan Note (Addendum)
Spirometry 10/31/2017  FEV1 0.7 (30%)  Ratio 51 p Breo in am   - 10/31/2017  Eos = 0.9  - alpha one AT screen 10/31/2017   MS level 121  - 10/31/2017   Walked RA x one lap @ 185 stopped due to sob s desats at fast pace   - 12/04/2017  After extensive coaching inhaler device,  effectiveness =    75% (short Ti still)   Improvdd doe/ no need for saba so change in rx needed. If having more symptoms or flares can add lama.   I had an extended discussion with the patient reviewing all relevant studies completed to date and  lasting 10  minutes of a 15 minute visit    See device teaching which extended face to face time for this visit.  Each maintenance medication was reviewed in detail including emphasizing most importantly the difference between maintenance and prns and under what circumstances the prns are to be triggered using an action plan format that is not reflected in the computer generated alphabetically organized AVS which I have not found useful in most complex patients, especially with respiratory illnesses.  Please see AVS for specific instructions unique to this visit that I personally wrote and verbalized to the the pt in detail and then reviewed with pt  by my nurse highlighting any  changes in therapy recommended at today's visit to their plan of care.

## 2017-12-20 ENCOUNTER — Other Ambulatory Visit: Payer: Self-pay

## 2017-12-20 ENCOUNTER — Emergency Department (HOSPITAL_COMMUNITY)
Admission: EM | Admit: 2017-12-20 | Discharge: 2017-12-20 | Disposition: A | Discharge status: Home / Self Care | Payer: BLUE CROSS/BLUE SHIELD | Attending: Emergency Medicine | Admitting: Emergency Medicine

## 2017-12-20 ENCOUNTER — Emergency Department (HOSPITAL_COMMUNITY): Payer: BLUE CROSS/BLUE SHIELD

## 2017-12-20 ENCOUNTER — Encounter (HOSPITAL_COMMUNITY): Payer: Self-pay | Admitting: Emergency Medicine

## 2017-12-20 DIAGNOSIS — R0602 Shortness of breath: Secondary | ICD-10-CM | POA: Diagnosis present

## 2017-12-20 DIAGNOSIS — Z87891 Personal history of nicotine dependence: Secondary | ICD-10-CM | POA: Diagnosis not present

## 2017-12-20 DIAGNOSIS — J441 Chronic obstructive pulmonary disease with (acute) exacerbation: Secondary | ICD-10-CM | POA: Insufficient documentation

## 2017-12-20 DIAGNOSIS — Z79899 Other long term (current) drug therapy: Secondary | ICD-10-CM | POA: Insufficient documentation

## 2017-12-20 MED ORDER — IPRATROPIUM-ALBUTEROL 0.5-2.5 (3) MG/3ML IN SOLN
3.0000 mL | RESPIRATORY_TRACT | Status: DC
  Administered 2017-12-20: 3 mL via RESPIRATORY_TRACT
  Filled 2017-12-20: qty 3

## 2017-12-20 MED ORDER — ALBUTEROL SULFATE (2.5 MG/3ML) 0.083% IN NEBU
INHALATION_SOLUTION | RESPIRATORY_TRACT | Status: AC
  Administered 2017-12-20: 5 mg via RESPIRATORY_TRACT
  Filled 2017-12-20: qty 6

## 2017-12-20 MED ORDER — ALBUTEROL SULFATE (2.5 MG/3ML) 0.083% IN NEBU
5.0000 mg | INHALATION_SOLUTION | Freq: Once | RESPIRATORY_TRACT | Status: AC
  Administered 2017-12-20: 5 mg via RESPIRATORY_TRACT

## 2017-12-20 NOTE — ED Provider Notes (Signed)
[facility] DEPT Provider Note: Georgena Spurling, MD, FACEP  CSN: [id number] MRN: [id number] ARRIVAL: 12/20/17 at [place]: [place] [facility]   HISTORY OF PRESENT ILLNESS  12/20/17 4:43 AM Julia ROSEMAN is a 67 y.o. female with a history of COPD.  She is here with about 24 hours of worsened shortness of breath.  Symptoms are moderate to severe, worse with exertion.  She has been using her albuterol inhaler and her albuterol neb machine without adequate relief.  She was started on a prednisone taper yesterday by her PCP.  She was able to go to work but symptoms persisted so she came to the ED.  On arrival she was noted to have expiratory wheezes and mild tachypnea.  She was given an albuterol neb treatment with improvement.  She denies chest pain but says her chest does feel tight.  She has had an occasional cough but no fever.   Past Medical History:  Diagnosis Date  . Anxiety   . COPD (chronic obstructive pulmonary disease) ([place])   . Depression     Past Surgical History:  Procedure Laterality Date  . ABDOMINAL HYSTERECTOMY     partial     History reviewed. No pertinent family history.  Social History   Tobacco Use  . Smoking status: Former Smoker    Packs/day: 1.00    Years: 40.00    Pack years: 40.00    Types: Cigarettes    Last attempt to quit: 08/23/2017    Years since quitting: 0.3  . Smokeless tobacco: Never Used  Substance Use Topics  . Alcohol use: No  . Drug use: No    Prior to Admission medications   Medication Sig Start Date End Date Taking? Authorizing Provider  albuterol (PROVENTIL HFA;VENTOLIN HFA) 108 (90 BASE) MCG/ACT inhaler Inhale 2 puffs into the lungs every 6 (six) hours as needed for wheezing or shortness of breath. 02/17/13  Yes Dutch Quint B, FNP  albuterol (PROVENTIL) (2.5 MG/3ML) 0.083% nebulizer solution Take 3 mLs by nebulization 3 (three) times daily as needed for wheezing or shortness of breath.   08/07/17  Yes [provider]  ALPRAZolam Duanne Moron) 0.25 MG tablet Take 0.25 mg by mouth daily as needed for anxiety.  07/21/17  Yes [provider]  atorvastatin (LIPITOR) 20 MG tablet Take 20 mg by mouth at bedtime. 07/27/17  Yes [provider]  Bismuth Subsalicylate (PEPTO-BISMOL) 262 MG TABS Take 2 tablets by mouth daily as needed (upset stomach).   Yes [provider]  budesonide-formoterol (SYMBICORT) 160-4.5 MCG/ACT inhaler Inhale 2 puffs into the lungs 2 (two) times daily. 10/31/17  Yes Tanda Rockers, MD  Dexmethylphenidate HCl 40 MG CP24 Take 40 mg by mouth at bedtime. 07/21/17  Yes [provider]  lamoTRIgine (LAMICTAL) 200 MG tablet Take 200 mg by mouth at bedtime.    Yes [provider]  magic mouthwash w/lidocaine SOLN Take 5 mLs by mouth 3 (three) times daily as needed for mouth pain.  08/07/17  Yes [provider]  nicotine (NICODERM CQ - DOSED IN MG/24 HOURS) 21 mg/24hr patch Place 21 mg onto the skin daily.   Yes [provider]  QUEtiapine (SEROQUEL) 25 MG tablet Take 50 mg by mouth at bedtime.   Yes [provider]  risperiDONE (RISPERDAL) 1 MG tablet Take 3 mg by mouth at bedtime.  07/06/17  Yes [provider]  venlafaxine XR (EFFEXOR-XR) 150 MG 24 hr capsule Take  150 mg by mouth at bedtime.    Yes [provider]  Vitamin D, Ergocalciferol, (DRISDOL) 50000 units CAPS capsule Take 50,000 Units by mouth once a week. On Saturdays 06/18/17  Yes [provider]    Allergies Sulfa antibiotics   REVIEW OF SYSTEMS  Negative except as noted here or in the History of Present Illness.   PHYSICAL EXAMINATION  Initial Vital Signs Blood pressure (!) 168/105, pulse 96, temperature 97.6 F (36.4 C), temperature source Oral, resp. rate (!) 22, height 5\' 3"  (1.6 m), weight 86.8 kg, SpO2 98 %.  Examination General: Well-developed, well-nourished female in no acute distress;  appearance consistent with age of record HENT: normocephalic; atraumatic Eyes: pupils equal, round and reactive to light; extraocular muscles intact Neck: supple Heart: regular rate and rhythm Lungs: clear to auscultation bilaterally Abdomen: soft; nondistended; nontender; bowel sounds present Extremities: No deformity; full range of motion; pulses normal Neurologic: Awake, alert and oriented; motor function intact in all extremities and symmetric; no facial droop Skin: Warm and dry Psychiatric: Normal mood and affect   RESULTS  Summary of this visit's results, reviewed by myself:   EKG Interpretation  Date/Time:  Thursday December 20 2017 03:55:14 EST Ventricular Rate:  96 PR Interval:    QRS Duration: 100 QT Interval:  359 QTC Calculation: 208 R Axis:   92 Text Interpretation:  Sinus rhythm Inferior infarct, old T-wave inversions no longer present Confirmed by Desarae Placide [phone]) on 12/20/2017 3:58:44 AM      Laboratory Studies: No results found for this or any previous visit (from the past 24 hour(s)). Imaging Studies: Dg Chest 2 View  Result Date: 12/20/2017 CLINICAL DATA:  Shortness of breath. COPD. EXAM: CHEST - 2 VIEW COMPARISON:  11/08/2017. FINDINGS: Normal sized heart. Clear lungs. Mild peribronchial thickening. The hemidiaphragms are flattened on the lateral view. Thoracic spine degenerative changes. IMPRESSION: No acute abnormality.  Mild changes of COPD and chronic bronchitis. Electronically Signed   By: Claudie Revering M.D.   On: 12/20/2017 04:24    ED COURSE and MDM  Nursing notes and initial vitals signs, including pulse oximetry, reviewed.  Vitals:   12/20/17 0354 12/20/17 0355 12/20/17 0516 12/20/17 0530  BP: (!) 168/105  137/83 (!) 144/92  Pulse: 96  96   Resp: (!) 22  15 (!) 25  Temp: 97.6 F (36.4 C)     TempSrc: Oral     SpO2: 98%  94%   Weight:  86.8 kg    Height:  5\' 3"  (1.6 m)     5:44 AM Patient feels back to baseline after second neb  treatment.  A faint expiratory wheeze remains in the right base.  PROCEDURES    ED DIAGNOSES     ICD-10-CM   1. COPD exacerbation ([place]) J44.1        Dyanne Yorks, MD 12/20/17 [id number]

## 2017-12-20 NOTE — ED Triage Notes (Signed)
Patient is complaining of having sob. Patient states she has a hx of COPD. Patient does not use oxygen at home. Patient has audible expiratory wheezing.

## 2017-12-31 ENCOUNTER — Emergency Department (HOSPITAL_COMMUNITY): Payer: BLUE CROSS/BLUE SHIELD

## 2017-12-31 ENCOUNTER — Encounter (HOSPITAL_COMMUNITY): Payer: Self-pay | Admitting: *Deleted

## 2017-12-31 ENCOUNTER — Inpatient Hospital Stay (HOSPITAL_COMMUNITY)
Admission: EM | Admit: 2017-12-31 | Discharge: 2018-01-02 | DRG: 190 | Disposition: A | Discharge status: Home / Self Care | Payer: BLUE CROSS/BLUE SHIELD | Attending: Internal Medicine | Admitting: Internal Medicine

## 2017-12-31 DIAGNOSIS — F319 Bipolar disorder, unspecified: Secondary | ICD-10-CM | POA: Diagnosis present

## 2017-12-31 DIAGNOSIS — Z87891 Personal history of nicotine dependence: Secondary | ICD-10-CM | POA: Diagnosis not present

## 2017-12-31 DIAGNOSIS — J441 Chronic obstructive pulmonary disease with (acute) exacerbation: Principal | ICD-10-CM

## 2017-12-31 DIAGNOSIS — T380X5A Adverse effect of glucocorticoids and synthetic analogues, initial encounter: Secondary | ICD-10-CM | POA: Diagnosis present

## 2017-12-31 DIAGNOSIS — Z882 Allergy status to sulfonamides status: Secondary | ICD-10-CM

## 2017-12-31 DIAGNOSIS — J9601 Acute respiratory failure with hypoxia: Secondary | ICD-10-CM | POA: Diagnosis present

## 2017-12-31 DIAGNOSIS — F329 Major depressive disorder, single episode, unspecified: Secondary | ICD-10-CM

## 2017-12-31 DIAGNOSIS — J9620 Acute and chronic respiratory failure, unspecified whether with hypoxia or hypercapnia: Secondary | ICD-10-CM | POA: Diagnosis present

## 2017-12-31 DIAGNOSIS — Z79899 Other long term (current) drug therapy: Secondary | ICD-10-CM | POA: Diagnosis not present

## 2017-12-31 DIAGNOSIS — E785 Hyperlipidemia, unspecified: Secondary | ICD-10-CM | POA: Diagnosis present

## 2017-12-31 DIAGNOSIS — Z7951 Long term (current) use of inhaled steroids: Secondary | ICD-10-CM

## 2017-12-31 DIAGNOSIS — R739 Hyperglycemia, unspecified: Secondary | ICD-10-CM | POA: Diagnosis present

## 2017-12-31 DIAGNOSIS — Z90710 Acquired absence of both cervix and uterus: Secondary | ICD-10-CM | POA: Diagnosis not present

## 2017-12-31 DIAGNOSIS — F419 Anxiety disorder, unspecified: Secondary | ICD-10-CM | POA: Diagnosis present

## 2017-12-31 DIAGNOSIS — J9621 Acute and chronic respiratory failure with hypoxia: Secondary | ICD-10-CM

## 2017-12-31 LAB — BLOOD GAS, ARTERIAL
Acid-base deficit: 0.6 mmol/L (ref 0.0–2.0)
Bicarbonate: 24.3 mmol/L (ref 20.0–28.0)
Drawn by: 331471
O2 Content: 2 L/min
O2 Saturation: 94.5 %
Patient temperature: 98.6
pCO2 arterial: 43.4 mmHg (ref 32.0–48.0)
pH, Arterial: 7.367 (ref 7.350–7.450)
pO2, Arterial: 73.3 mmHg — ABNORMAL LOW (ref 83.0–108.0)

## 2017-12-31 LAB — CBC WITH DIFFERENTIAL/PLATELET
ABS IMMATURE GRANULOCYTES: 0.07 10*3/uL (ref 0.00–0.07)
Basophils Absolute: 0.1 10*3/uL (ref 0.0–0.1)
Basophils Relative: 1 %
Eosinophils Absolute: 0.9 10*3/uL — ABNORMAL HIGH (ref 0.0–0.5)
Eosinophils Relative: 8 %
HCT: 41.6 % (ref 36.0–46.0)
Hemoglobin: 12.9 g/dL (ref 12.0–15.0)
Immature Granulocytes: 1 %
Lymphocytes Relative: 12 %
Lymphs Abs: 1.4 10*3/uL (ref 0.7–4.0)
MCH: 32.6 pg (ref 26.0–34.0)
MCHC: 31 g/dL (ref 30.0–36.0)
MCV: 105.1 fL — ABNORMAL HIGH (ref 80.0–100.0)
Monocytes Absolute: 0.5 10*3/uL (ref 0.1–1.0)
Monocytes Relative: 4 %
NEUTROS ABS: 9.3 10*3/uL — AB (ref 1.7–7.7)
Neutrophils Relative %: 74 %
Platelets: 297 10*3/uL (ref 150–400)
RBC: 3.96 MIL/uL (ref 3.87–5.11)
RDW: 13 % (ref 11.5–15.5)
WBC: 12.3 10*3/uL — ABNORMAL HIGH (ref 4.0–10.5)
nRBC: 0 % (ref 0.0–0.2)

## 2017-12-31 LAB — BASIC METABOLIC PANEL
Anion gap: 12 (ref 5–15)
BUN: 10 mg/dL (ref 8–23)
CO2: 24 mmol/L (ref 22–32)
Calcium: 9.5 mg/dL (ref 8.9–10.3)
Chloride: 104 mmol/L (ref 98–111)
Creatinine, Ser: 0.79 mg/dL (ref 0.44–1.00)
GFR calc Af Amer: >60 mL/min (ref >60)
Glucose, Bld: 152 mg/dL — ABNORMAL HIGH (ref 70–99)
Potassium: 3.5 mmol/L (ref 3.5–5.1)
SODIUM: 140 mmol/L (ref 135–145)

## 2017-12-31 LAB — HEMOGLOBIN A1C
Hgb A1c MFr Bld: 5.1 % (ref 4.8–5.6)
Mean Plasma Glucose: 99.67 mg/dL

## 2017-12-31 LAB — MAGNESIUM: Magnesium: 3.5 mg/dL — ABNORMAL HIGH (ref 1.7–2.4)

## 2017-12-31 LAB — PHOSPHORUS: Phosphorus: 4.7 mg/dL — ABNORMAL HIGH (ref 2.5–4.6)

## 2017-12-31 MED ORDER — VITAMIN D (ERGOCALCIFEROL) 1.25 MG (50000 UNIT) PO CAPS: 50000.0000 [IU] | ORAL_CAPSULE | ORAL | Status: DC

## 2017-12-31 MED ORDER — DEXMETHYLPHENIDATE HCL ER 20 MG PO CP24: 40.0000 mg | ORAL_CAPSULE | Freq: Every day | ORAL | Status: DC

## 2017-12-31 MED ORDER — METHYLPREDNISOLONE SODIUM SUCC 40 MG IJ SOLR
40.0000 mg | Freq: Four times a day (QID) | INTRAMUSCULAR | Status: DC
  Administered 2017-12-31 – 2018-01-02 (×8): 40 mg via INTRAVENOUS
  Filled 2017-12-31 (×8): qty 1

## 2017-12-31 MED ORDER — CEFDINIR 300 MG PO CAPS
300.0000 mg | ORAL_CAPSULE | Freq: Two times a day (BID) | ORAL | Status: DC
  Administered 2017-12-31 – 2018-01-02 (×4): 300 mg via ORAL
  Filled 2017-12-31 (×5): qty 1

## 2017-12-31 MED ORDER — VENLAFAXINE HCL ER 150 MG PO CP24
150.0000 mg | ORAL_CAPSULE | Freq: Every day | ORAL | Status: DC
  Administered 2017-12-31 – 2018-01-01 (×2): 150 mg via ORAL
  Filled 2017-12-31 (×2): qty 1

## 2017-12-31 MED ORDER — BUDESONIDE 0.25 MG/2ML IN SUSP
0.2500 mg | Freq: Two times a day (BID) | RESPIRATORY_TRACT | Status: DC
  Administered 2017-12-31 – 2018-01-02 (×4): 0.25 mg via RESPIRATORY_TRACT
  Filled 2017-12-31 (×4): qty 2

## 2017-12-31 MED ORDER — SODIUM CHLORIDE 0.9 % IV SOLN
INTRAVENOUS | Status: DC
  Administered 2017-12-31: 13:00:00 via INTRAVENOUS

## 2017-12-31 MED ORDER — ALBUTEROL SULFATE (2.5 MG/3ML) 0.083% IN NEBU
2.5000 mg | INHALATION_SOLUTION | Freq: Four times a day (QID) | RESPIRATORY_TRACT | Status: DC | PRN
  Administered 2017-12-31: 2.5 mg via RESPIRATORY_TRACT

## 2017-12-31 MED ORDER — ATORVASTATIN CALCIUM 10 MG PO TABS
20.0000 mg | ORAL_TABLET | Freq: Every day | ORAL | Status: DC
  Administered 2017-12-31 – 2018-01-01 (×2): 20 mg via ORAL
  Filled 2017-12-31 (×2): qty 2

## 2017-12-31 MED ORDER — INSULIN ASPART 100 UNIT/ML ~~LOC~~ SOLN: 0.0000 [IU] | Freq: Every day | SUBCUTANEOUS | Status: DC

## 2017-12-31 MED ORDER — INSULIN ASPART 100 UNIT/ML ~~LOC~~ SOLN
0.0000 [IU] | Freq: Three times a day (TID) | SUBCUTANEOUS | Status: DC
  Administered 2018-01-01: 2 [IU] via SUBCUTANEOUS
  Administered 2018-01-01: 1 [IU] via SUBCUTANEOUS
  Administered 2018-01-01: 2 [IU] via SUBCUTANEOUS
  Administered 2018-01-02 (×2): 1 [IU] via SUBCUTANEOUS

## 2017-12-31 MED ORDER — ENOXAPARIN SODIUM 40 MG/0.4ML ~~LOC~~ SOLN
40.0000 mg | SUBCUTANEOUS | Status: DC
  Administered 2017-12-31 – 2018-01-01 (×2): 40 mg via SUBCUTANEOUS
  Filled 2017-12-31 (×2): qty 0.4

## 2017-12-31 MED ORDER — QUETIAPINE FUMARATE 25 MG PO TABS
50.0000 mg | ORAL_TABLET | Freq: Every day | ORAL | Status: DC
  Administered 2017-12-31 – 2018-01-01 (×2): 50 mg via ORAL
  Filled 2017-12-31 (×2): qty 2

## 2017-12-31 MED ORDER — ALBUTEROL SULFATE (2.5 MG/3ML) 0.083% IN NEBU
5.0000 mg | INHALATION_SOLUTION | Freq: Once | RESPIRATORY_TRACT | Status: AC
  Administered 2017-12-31: 5 mg via RESPIRATORY_TRACT
  Filled 2017-12-31: qty 6

## 2017-12-31 MED ORDER — ALBUTEROL (5 MG/ML) CONTINUOUS INHALATION SOLN
10.0000 mg/h | INHALATION_SOLUTION | Freq: Once | RESPIRATORY_TRACT | Status: AC
  Administered 2017-12-31: 10 mg/h via RESPIRATORY_TRACT
  Filled 2017-12-31: qty 20

## 2017-12-31 MED ORDER — LAMOTRIGINE 100 MG PO TABS
200.0000 mg | ORAL_TABLET | Freq: Every day | ORAL | Status: DC
  Administered 2017-12-31 – 2018-01-01 (×2): 200 mg via ORAL
  Filled 2017-12-31 (×2): qty 2

## 2017-12-31 MED ORDER — IPRATROPIUM-ALBUTEROL 0.5-2.5 (3) MG/3ML IN SOLN: 3.0000 mL | Freq: Four times a day (QID) | RESPIRATORY_TRACT | Status: DC | PRN

## 2017-12-31 MED ORDER — ALPRAZOLAM 0.25 MG PO TABS: 0.2500 mg | ORAL_TABLET | Freq: Every day | ORAL | Status: DC | PRN

## 2017-12-31 MED ORDER — POTASSIUM CHLORIDE CRYS ER 20 MEQ PO TBCR
40.0000 meq | EXTENDED_RELEASE_TABLET | Freq: Once | ORAL | Status: AC
  Administered 2017-12-31: 40 meq via ORAL
  Filled 2017-12-31: qty 2

## 2017-12-31 MED ORDER — IPRATROPIUM-ALBUTEROL 0.5-2.5 (3) MG/3ML IN SOLN
3.0000 mL | Freq: Four times a day (QID) | RESPIRATORY_TRACT | Status: DC
  Administered 2017-12-31 – 2018-01-02 (×8): 3 mL via RESPIRATORY_TRACT
  Filled 2017-12-31 (×8): qty 3

## 2017-12-31 MED ORDER — RISPERIDONE 1 MG PO TABS
3.0000 mg | ORAL_TABLET | Freq: Every day | ORAL | Status: DC
  Administered 2017-12-31 – 2018-01-01 (×2): 3 mg via ORAL
  Filled 2017-12-31 (×2): qty 3

## 2017-12-31 NOTE — ED Provider Notes (Signed)
[facility] DEPT Provider Note   CSN: [id number] Arrival date & time: 12/31/17  1213     History   Chief Complaint Chief Complaint  Patient presents with  . Shortness of Breath    HPI Julia Daniels is a 67 y.o. female.  67 year old female with history of COPD presents with increased dyspnea since last night.  Symptoms consistent with her prior COPD exacerbations.  Used her home nebulizer as well as inhalers without relief.  Has had nonproductive cough without fever or chills.  No anginal or CHF symptoms.  No hemoptysis.  EMS called and patient was found to have O2 sat of 92%.  Given albuterol 15 mg along with Atrovent and Solu-Medrol.  Patient also given magnesium IV.  Her breathing has greatly improved.  States last time she felt like this she had to be admitted to the hospital     Past Medical History:  Diagnosis Date  . Anxiety   . COPD (chronic obstructive pulmonary disease) ([place])   . Depression     Patient Active Problem List   Diagnosis Date Noted  . COPD exacerbation ([place]) 09/18/2017  . Hypoxia   . Hyperlipidemia 08/23/2017  . Depression 08/23/2017  . Anxiety 08/23/2017  . Hypercalcemia 08/23/2017  . Thrombocytosis ([place]) 08/23/2017  . COPD with acute exacerbation ([place]) 08/23/2017  . Acute respiratory failure with hypoxia and hypercapnia ([place]) 08/23/2017  . ADD (attention deficit disorder) 08/30/2012  . Bipolar disorder, unspecified ([place]) 08/30/2012  . COPD  GOLD ? III with increased Eos 08/30/2012  . Tobacco abuse 08/30/2012    Past Surgical History:  Procedure Laterality Date  . ABDOMINAL HYSTERECTOMY     partial      OB History   No obstetric history on file.      Home Medications    Prior to Admission medications   Medication Sig Start Date End Date Taking? Authorizing Provider  albuterol (PROVENTIL HFA;VENTOLIN HFA) 108 (90 BASE) MCG/ACT inhaler Inhale 2 puffs into the lungs every 6 (six) hours as needed  for wheezing or shortness of breath. 02/17/13  Yes Dutch Quint B, FNP  albuterol (PROVENTIL) (2.5 MG/3ML) 0.083% nebulizer solution Take 3 mLs by nebulization 3 (three) times daily as needed for wheezing or shortness of breath.  08/07/17  Yes [provider]  ALPRAZolam Duanne Moron) 0.25 MG tablet Take 0.25 mg by mouth daily as needed for anxiety.  07/21/17  Yes [provider]  atorvastatin (LIPITOR) 20 MG tablet Take 20 mg by mouth at bedtime. 07/27/17  Yes [provider]  Bismuth Subsalicylate (PEPTO-BISMOL) 262 MG TABS Take 2 tablets by mouth daily as needed (upset stomach).   Yes [provider]  budesonide-formoterol (SYMBICORT) 160-4.5 MCG/ACT inhaler Inhale 2 puffs into the lungs 2 (two) times daily. 10/31/17  Yes Tanda Rockers, MD  Dexmethylphenidate HCl 40 MG CP24 Take 40 mg by mouth at bedtime. 07/21/17  Yes [provider]  lamoTRIgine (LAMICTAL) 200 MG tablet Take 200 mg by mouth at bedtime.    Yes [provider]  QUEtiapine (SEROQUEL) 25 MG tablet Take 50 mg by mouth at bedtime.   Yes [provider]  risperiDONE (RISPERDAL) 1 MG tablet Take 3 mg by mouth at bedtime.  07/06/17  Yes [provider]  venlafaxine XR (EFFEXOR-XR) 150 MG 24 hr capsule Take 150 mg by mouth at bedtime.    Yes [provider]  Vitamin D, Ergocalciferol, (DRISDOL) 50000 units CAPS capsule Take 50,000 Units by mouth  once a week. On Saturdays 06/18/17  Yes [provider]    Family History No family history on file.  Social History Social History   Tobacco Use  . Smoking status: Former Smoker    Packs/day: 1.00    Years: 40.00    Pack years: 40.00    Types: Cigarettes    Last attempt to quit: 08/23/2017    Years since quitting: 0.3  . Smokeless tobacco: Never Used  Substance Use Topics  . Alcohol use: No  . Drug use: No     Allergies   Sulfa antibiotics   Review of Systems Review of Systems  All other systems  reviewed and are negative.    Physical Exam Updated Vital Signs BP 128/86   Pulse (!) 120   Temp 98.7 F (37.1 C) (Oral)   Resp (!) 32   SpO2 98%   Physical Exam Vitals signs and nursing note reviewed.  Constitutional:      General: She is not in acute distress.    Appearance: Normal appearance. She is well-developed. She is not toxic-appearing.  HENT:     Head: Normocephalic and atraumatic.  Eyes:     General: Lids are normal.     Conjunctiva/sclera: Conjunctivae normal.     Pupils: Pupils are equal, round, and reactive to light.  Neck:     Musculoskeletal: Normal range of motion and neck supple.     Thyroid: No thyroid mass.     Trachea: No tracheal deviation.  Cardiovascular:     Rate and Rhythm: Regular rhythm. Tachycardia present.     Heart sounds: Normal heart sounds. No murmur. No gallop.   Pulmonary:     Effort: Pulmonary effort is normal. No respiratory distress.     Breath sounds: No stridor. Examination of the right-upper field reveals decreased breath sounds and wheezing. Examination of the left-upper field reveals decreased breath sounds and wheezing. Decreased breath sounds and wheezing present. No rhonchi or rales.  Abdominal:     General: Bowel sounds are normal. There is no distension.     Palpations: Abdomen is soft.     Tenderness: There is no abdominal tenderness. There is no rebound.  Musculoskeletal: Normal range of motion.        General: No tenderness.  Skin:    General: Skin is warm and dry.     Findings: No abrasion or rash.  Neurological:     Mental Status: She is alert and oriented to person, place, and time.     GCS: GCS eye subscore is 4. GCS verbal subscore is 5. GCS motor subscore is 6.     Cranial Nerves: No cranial nerve deficit.     Sensory: No sensory deficit.  Psychiatric:        Speech: Speech normal.        Behavior: Behavior normal.      ED Treatments / Results  Labs (all labs ordered are listed, but only abnormal  results are displayed) Labs Reviewed  CBC WITH DIFFERENTIAL/PLATELET  BASIC METABOLIC PANEL    EKG EKG Interpretation  Date/Time:  Monday December 31 2017 12:30:05 EST Ventricular Rate:  121 PR Interval:    QRS Duration: 113 QT Interval:  306 QTC Calculation: 435 R Axis:   81 Text Interpretation:  Sinus tachycardia Borderline intraventricular conduction delay Low voltage with right axis deviation Artifact in lead(s) I III aVR aVL Confirmed by Lacretia Leigh ([id number]) on 12/31/2017 12:53:14 PM   Radiology No results found.  Procedures  Procedures (including critical care time)  Medications Ordered in ED Medications  0.9 %  sodium chloride infusion (has no administration in time range)  albuterol (PROVENTIL,VENTOLIN) solution continuous neb (has no administration in time range)  albuterol (PROVENTIL) (2.5 MG/3ML) 0.083% nebulizer solution 5 mg (5 mg Nebulization Given 12/31/17 1228)     Initial Impression / Assessment and Plan / ED Course  I have reviewed the triage vital signs and the nursing notes.  Pertinent labs & imaging results that were available during my care of the patient were reviewed by me and considered in my medical decision making (see chart for details).     Patient received albuterol here 10 mg over an hour.  She had received multiple treatments prior to arrival.  Still has pursed lip breathing at this time.  Airway is intact however.  Will require inpatient hospitalization  CRITICAL CARE Performed by: Leota Jacobsen Total critical care time: 50 minutes Critical care time was exclusive of separately billable procedures and treating other patients. Critical care was necessary to treat or prevent imminent or life-threatening deterioration. Critical care was time spent personally by me on the following activities: development of treatment plan with patient and/or surrogate as well as nursing, discussions with consultants, evaluation of patient's response to  treatment, examination of patient, obtaining history from patient or surrogate, ordering and performing treatments and interventions, ordering and review of laboratory studies, ordering and review of radiographic studies, pulse oximetry and re-evaluation of patient's condition.   Final Clinical Impressions(s) / ED Diagnoses   Final diagnoses:  None    ED Discharge Orders    None       Lacretia Leigh, MD 12/31/17 1426

## 2017-12-31 NOTE — ED Triage Notes (Signed)
Pt complains of SOB since last night while at work. Pt has COPD. Pt was anxious, RR was 60, sats 92% upon EMS arrival. Pt given 15mg  albuterol, 1mg  atrovent, 125mg  solumedrol IV, magnesium sulfate IV.   BP 150/80 RR 28 O2 97% on nebulizer

## 2017-12-31 NOTE — ED Notes (Signed)
ED TO INPATIENT HANDOFF REPORT  Name/Age/Gender Julia Daniels 67 y.o. female  Code Status Code Status History    Date Active Date Inactive Code Status Order ID Comments User Context   09/18/2017 0510 09/19/2017 1455 Full Code [id number]  Alma Friendly, MD Inpatient   08/23/2017 1102 08/25/2017 1446 Full Code [id number]  Kerney Elbe, DO Inpatient      Home/SNF/Other Home  Chief Complaint SOB  Level of Care/Admitting Diagnosis ED Disposition    ED Disposition Condition [facility] Area: [facility] [[id number]]  Level of Care: Telemetry [5]  Admit to tele based on following criteria: Other see comments  Comments: Resp failure  Diagnosis: Acute on chronic respiratory failure [facility]) [[id number]]  Admitting Physician: Shirlean Mylar  Attending Physician: Dana Allan I [[id number]]  Estimated length of stay: past midnight tomorrow  Certification:: I certify this patient will need inpatient services for at least 2 midnights  PT Class (Do Not Modify): Inpatient [101]  PT Acc Code (Do Not Modify): Private [1]       Medical History Past Medical History:  Diagnosis Date  . Anxiety   . COPD (chronic obstructive pulmonary disease) ([place])   . Depression     Allergies Allergies  Allergen Reactions  . Sulfa Antibiotics Other (See Comments)    Mouth gets raw    IV Location/Drains/Wounds Patient Lines/Drains/Airways Status   Active Line/Drains/Airways    Name:   Placement date:   Placement time:   Site:   Days:   Peripheral IV 12/31/17 Left Hand   12/31/17    1319    Hand   less than 1   Wound 02/07/13 Laceration Finger (Comment which one) Left small 1 cm lac to ring finger   02/07/13    1128    Finger (Comment which one)   1788          Labs/Imaging Results for orders placed or performed during the hospital encounter of 12/31/17 (from the past 48 hour(s))  CBC with Differential/Platelet     Status: Abnormal    Collection Time: 12/31/17  1:17 PM  Result Value Ref Range   WBC 12.3 (H) 4.0 - 10.5 K/uL   RBC 3.96 3.87 - 5.11 MIL/uL   Hemoglobin 12.9 12.0 - 15.0 g/dL   HCT 41.6 36.0 - 46.0 %   MCV 105.1 (H) 80.0 - 100.0 fL   MCH 32.6 26.0 - 34.0 pg   MCHC 31.0 30.0 - 36.0 g/dL   RDW 13.0 11.5 - 15.5 %   Platelets 297 150 - 400 K/uL   nRBC 0.0 0.0 - 0.2 %   Neutrophils Relative % 74 %   Neutro Abs 9.3 (H) 1.7 - 7.7 K/uL   Lymphocytes Relative 12 %   Lymphs Abs 1.4 0.7 - 4.0 K/uL   Monocytes Relative 4 %   Monocytes Absolute 0.5 0.1 - 1.0 K/uL   Eosinophils Relative 8 %   Eosinophils Absolute 0.9 (H) 0.0 - 0.5 K/uL   Basophils Relative 1 %   Basophils Absolute 0.1 0.0 - 0.1 K/uL   Immature Granulocytes 1 %   Abs Immature Granulocytes 0.07 0.00 - 0.07 K/uL    Comment: Performed at [facility], [place] [address] [place], [place] [address]  Basic metabolic panel     Status: Abnormal   Collection Time: 12/31/17  1:17 PM  Result Value Ref Range   Sodium 140 135 - 145 mmol/L   Potassium  3.5 3.5 - 5.1 mmol/L   Chloride 104 98 - 111 mmol/L   CO2 24 22 - 32 mmol/L   Glucose, Bld 152 (H) 70 - 99 mg/dL   BUN 10 8 - 23 mg/dL   Creatinine, Ser 0.79 0.44 - 1.00 mg/dL   Calcium 9.5 8.9 - 10.3 mg/dL   GFR calc non Af Amer >60 >60 mL/min   GFR calc Af Amer >60 >60 mL/min   Anion gap 12 5 - 15    Comment: Performed at [facility], [place] [address] [place], [place] [address]   Dg Chest 2 View  Result Date: 12/31/2017 CLINICAL DATA:  Shortness of breath since last night, COPD EXAM: CHEST - 2 VIEW COMPARISON:  12/20/2017 FINDINGS: Normal heart size, mediastinal contours, and pulmonary vascularity. Mild chronic bronchitic changes. Lungs otherwise clear. No pleural effusion or pneumothorax. Bones unremarkable. IMPRESSION: Chronic bronchitic changes without infiltrate. Electronically Signed   By: Lavonia Dana M.D.   On: 12/31/2017 13:17   EKG  Interpretation  Date/Time:  Monday December 31 2017 12:30:05 EST Ventricular Rate:  121 PR Interval:    QRS Duration: 113 QT Interval:  306 QTC Calculation: 435 R Axis:   81 Text Interpretation:  Sinus tachycardia Borderline intraventricular conduction delay Low voltage with right axis deviation Artifact in lead(s) I III aVR aVL Confirmed by Lacretia Leigh ([id number]) on 12/31/2017 12:53:14 PM   Pending Labs Unresulted Labs (From admission, onward)    Start     Ordered   12/31/17 1445  Blood gas, arterial  ONCE - STAT,   R     12/31/17 1445          Vitals/Pain Today's Vitals   12/31/17 1222 12/31/17 1226 12/31/17 1314  BP:  128/86   Pulse:  (!) 120   Resp:  (!) 32   Temp:  98.7 F (37.1 C)   TempSrc:  Oral   SpO2:  98% 98%  PainSc: 0-No pain      Isolation Precautions No active isolations  Medications Medications  0.9 %  sodium chloride infusion ( Intravenous New Bag/Given 12/31/17 1319)  albuterol (PROVENTIL) (2.5 MG/3ML) 0.083% nebulizer solution 5 mg (5 mg Nebulization Given 12/31/17 1228)  albuterol (PROVENTIL,VENTOLIN) solution continuous neb (10 mg/hr Nebulization Given 12/31/17 1312)    Mobility walks

## 2017-12-31 NOTE — ED Notes (Signed)
Bed: WA09 Expected date:  Expected time:  Means of arrival:  Comments: SOB

## 2017-12-31 NOTE — ED Notes (Signed)
Pt transported to xray 

## 2017-12-31 NOTE — H&P (Signed)
History and Physical  Julia Daniels PZW:[id number] DOB: Dec 25, 1950 DOA: 12/31/2017  Referring physician: ER physician, Dr. Lacretia Leigh PCP: [facility] [place]  Outpatient Specialists: None Patient coming from: Home  Chief Complaint: Shortness of breath and wheezing.  HPI:  Patient is a 67 year old Caucasian female, with past medical history significant for COPD, anxiety and depression.  Patient is a reformed cigarette user.  According to the patient, she developed shortness of breath and wheezing while at work earlier today.  Apparently, patient works third shift.  The extent of the shortness of breath was such that patient had to close from work early.  Patient continued to experience worsening shortness of breath and wheezing, and reported that she was not able to breathe.  On presentation to the hospital, the O2 sat was 92%.  Patient was not on home oxygen.  Patient was initially managed with IV Solu-Medrol and nebulizer treatment.  Patient is currently on 2 L of oxygen, continues to have wheezing.  No headache, no neck pain, no chest pain, no fever or chills, no URI symptoms, no cough for phlegm production, no GI symptoms and no urinary symptoms.  Patient will be admitted for further assessment and management of likely COPD exacerbation.  ED Course: On presentation to the ED, patient was managed with IV Solu-Medrol, IV magnesium nebulizer treatment.  Patient is on supplemental oxygen.  Hospitalist service has been called to admit patient for further assessment and management.  Pertinent labs: ABG revealed pH of 7.36, PCO2 of 43.4 and PO2 of 73.3.  Revealed sodium of 140, potassium of 3.5, chloride 104, CO2 of 24, BUN of 10 and creatinine of 0.79 with blood sugar of 152.  CBC reveals WBC of 12.3, hemoglobin of 12.8, hematocrit of 41.6, MCV of 105.1 platelet count of 297.  Chest x-ray reveals chronic bronchitic changes without infiltrate.  EKG: Independently reviewed.  Imaging:  independently reviewed.   Review of Systems:  Negative for fever, visual changes, sore throat, rash, new muscle aches, chest pain, dysuria, bleeding, n/v/abdominal pain.  Past Medical History:  Diagnosis Date  . Anxiety   . COPD (chronic obstructive pulmonary disease) ([place])   . Depression     Past Surgical History:  Procedure Laterality Date  . ABDOMINAL HYSTERECTOMY     partial      reports that she quit smoking about 4 months ago. Her smoking use included cigarettes. She has a 40.00 pack-year smoking history. She has never used smokeless tobacco. She reports that she does not drink alcohol or use drugs.  Allergies  Allergen Reactions  . Sulfa Antibiotics Other (See Comments)    Mouth gets raw    No family history on file.   Prior to Admission medications   Medication Sig Start Date End Date Taking? Authorizing Provider  albuterol (PROVENTIL HFA;VENTOLIN HFA) 108 (90 BASE) MCG/ACT inhaler Inhale 2 puffs into the lungs every 6 (six) hours as needed for wheezing or shortness of breath. 02/17/13  Yes Dutch Quint B, FNP  albuterol (PROVENTIL) (2.5 MG/3ML) 0.083% nebulizer solution Take 3 mLs by nebulization 3 (three) times daily as needed for wheezing or shortness of breath.  08/07/17  Yes [provider]  ALPRAZolam Duanne Moron) 0.25 MG tablet Take 0.25 mg by mouth daily as needed for anxiety.  07/21/17  Yes [provider]  atorvastatin (LIPITOR) 20 MG tablet Take 20 mg by mouth at bedtime. 07/27/17  Yes [provider]  Bismuth Subsalicylate (PEPTO-BISMOL) 262 MG TABS Take 2 tablets by mouth daily  as needed (upset stomach).   Yes [provider]  budesonide-formoterol (SYMBICORT) 160-4.5 MCG/ACT inhaler Inhale 2 puffs into the lungs 2 (two) times daily. 10/31/17  Yes Tanda Rockers, MD  Dexmethylphenidate HCl 40 MG CP24 Take 40 mg by mouth at bedtime. 07/21/17  Yes [provider]  lamoTRIgine (LAMICTAL) 200 MG tablet Take 200 mg by mouth at  bedtime.    Yes [provider]  QUEtiapine (SEROQUEL) 25 MG tablet Take 50 mg by mouth at bedtime.   Yes [provider]  risperiDONE (RISPERDAL) 1 MG tablet Take 3 mg by mouth at bedtime.  07/06/17  Yes [provider]  venlafaxine XR (EFFEXOR-XR) 150 MG 24 hr capsule Take 150 mg by mouth at bedtime.    Yes [provider]  Vitamin D, Ergocalciferol, (DRISDOL) 50000 units CAPS capsule Take 50,000 Units by mouth once a week. On Saturdays 06/18/17  Yes [provider]    Physical Exam: Vitals:   12/31/17 1226 12/31/17 1314  BP: 128/86   Pulse: (!) 120   Resp: (!) 32   Temp: 98.7 F (37.1 C)   TempSrc: Oral   SpO2: 98% 98%   Constitutional:  . Appears calm and comfortable Eyes:  . No pallor. No jaundice.  ENMT:  . external ears, nose appear normal Neck:  . Neck is supple. No JVD Respiratory:  . Decreased air entry globally, with expiratory wheeze.   Marland Kitchen Respiratory effort normal. No retractions or accessory muscle use Cardiovascular:  . S1S2 . No LE extremity edema   Abdomen:  . Abdomen is obese, soft and non tender. Organs are difficult to assess. Neurologic:  . Awake and alert. . Moves all limbs.  Wt Readings from Last 3 Encounters:  12/20/17 86.8 kg  12/04/17 86.8 kg  11/08/17 85.7 kg    I have personally reviewed following labs and imaging studies  Labs on Admission:  CBC: Recent Labs  Lab 12/31/17 1317  WBC 12.3*  NEUTROABS 9.3*  HGB 12.9  HCT 41.6  MCV 105.1*  PLT 341   Basic Metabolic Panel: Recent Labs  Lab 12/31/17 1317  NA 140  K 3.5  CL 104  CO2 24  GLUCOSE 152*  BUN 10  CREATININE 0.79  CALCIUM 9.5   Liver Function Tests: No results for input(s): AST, ALT, ALKPHOS, BILITOT, PROT, ALBUMIN in the last 168 hours. No results for input(s): LIPASE, AMYLASE in the last 168 hours. No results for input(s): AMMONIA in the last 168 hours. Coagulation Profile: No results for input(s): INR, PROTIME in  the last 168 hours. Cardiac Enzymes: No results for input(s): CKTOTAL, CKMB, CKMBINDEX, TROPONINI in the last 168 hours. BNP (last 3 results) No results for input(s): PROBNP in the last 8760 hours. HbA1C: No results for input(s): HGBA1C in the last 72 hours. CBG: No results for input(s): GLUCAP in the last 168 hours. Lipid Profile: No results for input(s): CHOL, HDL, LDLCALC, TRIG, CHOLHDL, LDLDIRECT in the last 72 hours. Thyroid Function Tests: No results for input(s): TSH, T4TOTAL, FREET4, T3FREE, THYROIDAB in the last 72 hours. Anemia Panel: No results for input(s): VITAMINB12, FOLATE, FERRITIN, TIBC, IRON, RETICCTPCT in the last 72 hours. Urine analysis:    Component Value Date/Time   BILIRUBINUR n 09/23/2012 1111   PROTEINUR n 09/23/2012 1111   UROBILINOGEN 0.2 09/23/2012 1111   NITRITE n 09/23/2012 1111   LEUKOCYTESUR Negative 09/23/2012 1111   Sepsis Labs: @LABRCNTIP (procalcitonin:4,lacticidven:4) )No results found for this or any previous visit (from the past  240 hour(s)).    Radiological Exams on Admission: Dg Chest 2 View  Result Date: 12/31/2017 CLINICAL DATA:  Shortness of breath since last night, COPD EXAM: CHEST - 2 VIEW COMPARISON:  12/20/2017 FINDINGS: Normal heart size, mediastinal contours, and pulmonary vascularity. Mild chronic bronchitic changes. Lungs otherwise clear. No pleural effusion or pneumothorax. Bones unremarkable. IMPRESSION: Chronic bronchitic changes without infiltrate. Electronically Signed   By: Lavonia Dana M.D.   On: 12/31/2017 13:17    EKG: Independently reviewed.   Active Problems:   * No active hospital problems. *   Assessment/Plan Acute respiratory failure with hypoxia/COPD with exacerbation: This likely secondary to COPD with exacerbation Start patient on IV Solu-Medrol, nebulizer treatment and nebs Pulmicort Omnicef 300 mg p.o. twice daily. ABG result is noted.  Elevated blood sugar: Check HbA1c Sliding scale insulin  coverage ACHS. Management will depend on above.  Depression/anxiety: Stable for now.   DVT prophylaxis: Subcu Lovenox Code Status: Full Family Communication:  Disposition Plan: Home eventually Consults called: None Admission status: Inpatient  Severity of illness: Patient is a 67 year old Caucasian female, with history of COPD, presents with acute respiratory failure necessitating supplemental oxygen, likely secondary to COPD with exacerbation.  Patient symptoms are severe, and to the extent that inpatient status is needed to manage patient.  Patient is at the risk of deterioration if not managed on an inpatient basis.  Time spent: 65 minutes  Dana Allan, MD  [facility] Pager #: [phone] 7PM-7AM contact night coverage as above  12/31/2017, 2:48 PM

## 2018-01-01 LAB — BASIC METABOLIC PANEL
Anion gap: 11 (ref 5–15)
BUN: 11 mg/dL (ref 8–23)
CO2: 23 mmol/L (ref 22–32)
Calcium: 10 mg/dL (ref 8.9–10.3)
Chloride: 105 mmol/L (ref 98–111)
Creatinine, Ser: 0.79 mg/dL (ref 0.44–1.00)
GFR calc Af Amer: >60 mL/min (ref >60)
GFR calc non Af Amer: >60 mL/min (ref >60)
Glucose, Bld: 148 mg/dL — ABNORMAL HIGH (ref 70–99)
Potassium: 5.1 mmol/L (ref 3.5–5.1)
Sodium: 139 mmol/L (ref 135–145)

## 2018-01-01 LAB — CBC
HCT: 39.5 % (ref 36.0–46.0)
Hemoglobin: 12.2 g/dL (ref 12.0–15.0)
MCH: 32.8 pg (ref 26.0–34.0)
MCHC: 30.9 g/dL (ref 30.0–36.0)
MCV: 106.2 fL — ABNORMAL HIGH (ref 80.0–100.0)
Platelets: 279 10*3/uL (ref 150–400)
RBC: 3.72 MIL/uL — ABNORMAL LOW (ref 3.87–5.11)
RDW: 13.2 % (ref 11.5–15.5)
WBC: 13.9 10*3/uL — ABNORMAL HIGH (ref 4.0–10.5)
nRBC: 0 % (ref 0.0–0.2)

## 2018-01-01 LAB — GLUCOSE, CAPILLARY
Glucose-Capillary: 177 mg/dL — ABNORMAL HIGH (ref 70–99)
Glucose-Capillary: 159 mg/dL — ABNORMAL HIGH (ref 70–99)
Glucose-Capillary: 162 mg/dL — ABNORMAL HIGH (ref 70–99)
Glucose-Capillary: 146 mg/dL — ABNORMAL HIGH (ref 70–99)
Glucose-Capillary: 138 mg/dL — ABNORMAL HIGH (ref 70–99)

## 2018-01-01 MED ORDER — ALBUTEROL SULFATE (2.5 MG/3ML) 0.083% IN NEBU: 2.5000 mg | INHALATION_SOLUTION | Freq: Four times a day (QID) | RESPIRATORY_TRACT | Status: DC | PRN

## 2018-01-01 MED ORDER — DEXMETHYLPHENIDATE HCL ER 20 MG PO CP24
40.0000 mg | ORAL_CAPSULE | Freq: Every day | ORAL | Status: DC
  Administered 2018-01-02: 40 mg via ORAL
  Filled 2018-01-01: qty 2

## 2018-01-01 MED ORDER — DEXMETHYLPHENIDATE HCL ER 20 MG PO CP24: 40.0000 mg | ORAL_CAPSULE | Freq: Every day | ORAL | Status: DC

## 2018-01-01 MED ORDER — ACETAMINOPHEN 325 MG PO TABS
650.0000 mg | ORAL_TABLET | Freq: Four times a day (QID) | ORAL | Status: DC | PRN
  Administered 2018-01-01: 650 mg via ORAL
  Filled 2018-01-01: qty 2

## 2018-01-01 NOTE — Progress Notes (Signed)
PROGRESS NOTE    Julia Daniels  GUY:[id number] DOB: November 26, 1950 DOA: 12/31/2017 [facility]CP: [facility] [place]  Outpatient Specialists:     Brief Narrative:  Admitted with COPD exacerbation.  Patient is improving slowly.  Hopefully, patient will discharge in the next 24 hours.   Assessment & Plan:   Active Problems:   Acute on chronic respiratory failure (HCC)   Respiratory failure, acute-on-chronic (HCC)  Acute respiratory failure with hypoxia/COPD with exacerbation: This likely secondary to COPD with exacerbation Start patient on IV Solu-Medrol, nebulizer treatment and nebs Pulmicort Omnicef 300 mg p.o. twice daily. ABG result is noted.  Elevated blood sugar: Check HbA1c Sliding scale insulin coverage ACHS. Management will depend on above.  Depression/anxiety: Stable for now.   DVT prophylaxis: Subcu Lovenox Code Status: Full Family Communication:  Disposition Plan: Home eventually Consults called: None  Procedures:   None  Antimicrobials:   Omnicef   Subjective: Shortness of breath and wheezing are improving.  Objective: Vitals:   01/01/18 0511 01/01/18 0521 01/01/18 0855 01/01/18 1352  BP: 93/79   100/74  Pulse: (!) 102 98  (!) 104  Resp: 17     Temp: 98.3 F (36.8 C)     TempSrc: Oral     SpO2: 96%  93% 95%    Intake/Output Summary (Last 24 hours) at 01/01/2018 1506 Last data filed at 01/01/2018 0945 Gross per 24 hour  Intake 480 ml  Output -  Net 480 ml   There were no vitals filed for this visit.  Examination:  General exam: Appears calm and comfortable  Respiratory system: Decreased air entry globally.  Improved wheezing. Cardiovascular system: S1 & S2. No pedal edema. Gastrointestinal system: Abdomen is obese, soft and nontender.  Organs are difficult to assess. Central nervous system: Alert and oriented. No focal neurological deficits. Extremities: No leg edema.  Data Reviewed: I have personally reviewed following  labs and imaging studies  CBC: Recent Labs  Lab 12/31/17 1317 01/01/18 0647  WBC 12.3* 13.9*  NEUTROABS 9.3*  --   HGB 12.9 12.2  HCT 41.6 39.5  MCV 105.1* 106.2*  PLT 297 563   Basic Metabolic Panel: Recent Labs  Lab 12/31/17 1317 01/01/18 0647  NA 140 139  K 3.5 5.1  CL 104 105  CO2 24 23  GLUCOSE 152* 148*  BUN 10 11  CREATININE 0.79 0.79  CALCIUM 9.5 10.0  MG 3.5*  --   PHOS 4.7*  --    GFR: Estimated Creatinine Clearance: 73.3 mL/min (by C-G formula based on SCr of 0.79 mg/dL). Liver Function Tests: No results for input(s): AST, ALT, ALKPHOS, BILITOT, PROT, ALBUMIN in the last 168 hours. No results for input(s): LIPASE, AMYLASE in the last 168 hours. No results for input(s): AMMONIA in the last 168 hours. Coagulation Profile: No results for input(s): INR, PROTIME in the last 168 hours. Cardiac Enzymes: No results for input(s): CKTOTAL, CKMB, CKMBINDEX, TROPONINI in the last 168 hours. BNP (last 3 results) No results for input(s): PROBNP in the last 8760 hours. HbA1C: Recent Labs    12/31/17 1317  HGBA1C 5.1   CBG: Recent Labs  Lab 12/31/17 2041 01/01/18 0722 01/01/18 1112  GLUCAP 177* 159* 162*   Lipid Profile: No results for input(s): CHOL, HDL, LDLCALC, TRIG, CHOLHDL, LDLDIRECT in the last 72 hours. Thyroid Function Tests: No results for input(s): TSH, T4TOTAL, FREET4, T3FREE, THYROIDAB in the last 72 hours. Anemia Panel: No results for input(s): VITAMINB12, FOLATE, FERRITIN, TIBC, IRON, RETICCTPCT in the last  72 hours. Urine analysis:    Component Value Date/Time   BILIRUBINUR n 09/23/2012 1111   PROTEINUR n 09/23/2012 1111   UROBILINOGEN 0.2 09/23/2012 1111   NITRITE n 09/23/2012 1111   LEUKOCYTESUR Negative 09/23/2012 1111   Sepsis Labs: @LABRCNTIP (procalcitonin:4,lacticidven:4)  )No results found for this or any previous visit (from the past 240 hour(s)).       Radiology Studies: Dg Chest 2 View  Result Date:  12/31/2017 CLINICAL DATA:  Shortness of breath since last night, COPD EXAM: CHEST - 2 VIEW COMPARISON:  12/20/2017 FINDINGS: Normal heart size, mediastinal contours, and pulmonary vascularity. Mild chronic bronchitic changes. Lungs otherwise clear. No pleural effusion or pneumothorax. Bones unremarkable. IMPRESSION: Chronic bronchitic changes without infiltrate. Electronically Signed   By: Lavonia Dana M.D.   On: 12/31/2017 13:17        Scheduled Meds: . atorvastatin  20 mg Oral QHS  . budesonide (PULMICORT) nebulizer solution  0.25 mg Nebulization BID  . cefdinir  300 mg Oral Q12H  . [START ON 01/02/2018] dexmethylphenidate  40 mg Oral QHS  . enoxaparin (LOVENOX) injection  40 mg Subcutaneous Q24H  . insulin aspart  0-5 Units Subcutaneous QHS  . insulin aspart  0-9 Units Subcutaneous TID WC  . ipratropium-albuterol  3 mL Nebulization Q6H  . lamoTRIgine  200 mg Oral QHS  . methylPREDNISolone (SOLU-MEDROL) injection  40 mg Intravenous Q6H  . QUEtiapine  50 mg Oral QHS  . risperiDONE  3 mg Oral QHS  . venlafaxine XR  150 mg Oral QHS  . [START ON 01/05/2018] Vitamin D (Ergocalciferol)  50,000 Units Oral Weekly   Continuous Infusions: . sodium chloride 10 mL/hr at 12/31/17 1319     LOS: 1 day    Time spent: 25 minutes   Dana Allan, MD  [facility] Pager #: [phone] 7PM-7AM contact night coverage as above

## 2018-01-02 LAB — GLUCOSE, CAPILLARY
Glucose-Capillary: 132 mg/dL — ABNORMAL HIGH (ref 70–99)
Glucose-Capillary: 131 mg/dL — ABNORMAL HIGH (ref 70–99)

## 2018-01-02 MED ORDER — IPRATROPIUM-ALBUTEROL 0.5-2.5 (3) MG/3ML IN SOLN: 3.0000 mL | Freq: Four times a day (QID) | RESPIRATORY_TRACT | 0 refills | Status: DC | PRN

## 2018-01-02 MED ORDER — TIOTROPIUM BROMIDE MONOHYDRATE 18 MCG IN CAPS: 18.0000 ug | ORAL_CAPSULE | Freq: Every day | RESPIRATORY_TRACT | 2 refills | Status: DC

## 2018-01-02 MED ORDER — PREDNISONE 10 MG PO TABS: ORAL_TABLET | ORAL | 0 refills | Status: DC

## 2018-01-02 NOTE — Discharge Summary (Signed)
Physician Discharge Summary  Patient ID: Julia Daniels MRN: [id number] DOB/AGE: 1950/03/26 67 y.o.  Admit date: 12/31/2017 Discharge date: 01/02/2018  Admission Diagnoses:  Discharge Diagnoses:  Active Problems:   Acute on chronic respiratory failure (HCC)   Respiratory failure, acute-on-chronic ([place])   Discharged Condition: stable  Hospital Course: Patient is a 67 year old Caucasian female, with past medical history significant for COPD, anxiety and depression.  Patient is a reformed cigarette user.  According to the patient, she developed shortness of breath and wheezing while at work on the day of admission.  The extent of the shortness of breath was such that patient had to close from work early.  Patient was admitted for further assessment and management.  Patient was managed with IV Solu-Medrol, antibiotics and nebulizer treatment.  Patient was also managed with supplemental oxygen.  Patient has gradually improved, and is eager to be discharged back home.  Patient will follow with the primary care provider on discharge.    Acute respiratory failure with hypoxia/COPD with exacerbation: -This likely secondary to COPD with exacerbation -Patient was managed with IV Solu-Medrol, nebulizer treatment, antibiotics (Omnicef 300 Mg p.o. twice daily) and nebs Pulmicort - ABG done on presentation revealed pH of 7.367, PCO2 of 43.4 and PO2 of 73.3.    Elevated blood sugar: - HbA1c was 5.1% - During the hospital stay, patient's blood sugar was checked, and covered.  Sliding scale insulin coverageACHS. -Possibly steroid-induced hyperglycemia. -Continue to monitor closely for possible underlying mild diabetes mellitus, undiagnosed.  Depression/anxiety: Stable for now.  Consults: None  Discharge Exam: Blood pressure (!) 150/90, pulse (!) 103, temperature 99.1 F (37.3 C), temperature source Oral, resp. rate 20, SpO2 92 %.  Disposition: Discharge disposition: 01-Home or Self  Care   Discharge Instructions    Diet - low sodium heart healthy   Complete by:  As directed    Increase activity slowly   Complete by:  As directed      Allergies as of 01/02/2018      Reactions   Sulfa Antibiotics Other (See Comments)   Mouth gets raw      Medication List    STOP taking these medications   albuterol (2.5 MG/3ML) 0.083% nebulizer solution Commonly known as:  PROVENTIL   albuterol 108 (90 Base) MCG/ACT inhaler Commonly known as:  PROVENTIL HFA;VENTOLIN HFA   PEPTO-BISMOL 262 MG Tabs Generic drug:  Bismuth Subsalicylate     TAKE these medications   ALPRAZolam 0.25 MG tablet Commonly known as:  XANAX Take 0.25 mg by mouth daily as needed for anxiety.   atorvastatin 20 MG tablet Commonly known as:  LIPITOR Take 20 mg by mouth at bedtime.   budesonide-formoterol 160-4.5 MCG/ACT inhaler Commonly known as:  SYMBICORT Inhale 2 puffs into the lungs 2 (two) times daily.   Dexmethylphenidate HCl 40 MG Cp24 Take 40 mg by mouth at bedtime.   ipratropium-albuterol 0.5-2.5 (3) MG/3ML Soln Commonly known as:  DUONEB Take 3 mLs by nebulization every 6 (six) hours as needed.   lamoTRIgine 200 MG tablet Commonly known as:  LAMICTAL Take 200 mg by mouth at bedtime.   predniSONE 10 MG tablet Commonly known as:  DELTASONE Prednisone 60 mg po daily for 3 days, then 40 mg po daily for 3 days, then 30 mg po daily for 3 days, then 20 mg po daily for 3 days and then 10 mg po daily for 3 days.   QUEtiapine 25 MG tablet Commonly known as:  SEROQUEL Take 50 mg  by mouth at bedtime.   risperiDONE 1 MG tablet Commonly known as:  RISPERDAL Take 3 mg by mouth at bedtime.   tiotropium 18 MCG inhalation capsule Commonly known as:  SPIRIVA HANDIHALER Place 1 capsule (18 mcg total) into inhaler and inhale daily.   venlafaxine XR 150 MG 24 hr capsule Commonly known as:  EFFEXOR-XR Take 150 mg by mouth at bedtime.   Vitamin D (Ergocalciferol) 1.25 MG (50000 UT) Caps  capsule Commonly known as:  DRISDOL Take 50,000 Units by mouth once a week. On Saturdays        Signed: Bonnell Public 01/02/2018, 5:02 PM

## 2018-01-02 NOTE — Progress Notes (Signed)
SATURATION QUALIFICATIONS: (This note is used to comply with regulatory documentation for home oxygen)  Patient Saturations on Room Air at Rest = 94%  Patient Saturations on Room Air while Ambulating = 91%   

## 2018-01-18 ENCOUNTER — Encounter: Payer: Self-pay | Admitting: Internal Medicine

## 2018-01-18 ENCOUNTER — Ambulatory Visit (INDEPENDENT_AMBULATORY_CARE_PROVIDER_SITE_OTHER): Payer: BLUE CROSS/BLUE SHIELD | Admitting: Internal Medicine

## 2018-01-18 VITALS — BP 168/60 | HR 93 | Ht 63.0 in | Wt 199.8 lb

## 2018-01-18 DIAGNOSIS — J449 Chronic obstructive pulmonary disease, unspecified: Secondary | ICD-10-CM

## 2018-01-18 MED ORDER — PANTOPRAZOLE SODIUM 40 MG PO TBEC: 40.0000 mg | DELAYED_RELEASE_TABLET | Freq: Every day | ORAL | 2 refills | Status: DC

## 2018-01-18 MED ORDER — FAMOTIDINE 20 MG PO TABS: ORAL_TABLET | ORAL | 11 refills | Status: DC

## 2018-01-18 NOTE — Progress Notes (Signed)
Julia Daniels, female    DOB: 1950/10/02, .   MRN: [id number]     Brief patient profile:  40 yowf  MS  quit smoking 08/23/17  With indolent  onset around 2014 doe gradually worse and started [facility] by [place] 2017 then admit [facility] x 2 late summer 2019 with dx of aecopd  And since then highly variable sob So self- referred to pulmonary clinic 10/31/2017 with GOLD III criteria for copd on initial eval     History of Present Illness  10/31/2017  Pulmonary/ 1st office eval/ Aleya Durnell Chief Complaint  Patient presents with  . Pulmonary Consult    Self referral for COPD. Pt was dxed with COPD 2 70yrs ago. She has been seen in the past by Dr Verdie Mosher. She states she gets tired easily. She gets winded with carrying something heavy and some days with minimal walking. She uses proair once daily on average and neb with albuterol 3-4 x per wk.   Dyspnea:  Best days doe = MMRC3 = can't walk 100 yards even at a slow pace at a flat grade s stopping due to sob  / better p a few hours but highly variable s pattern  Cough: better since stopped smoking Sleep: bed is flat/ 2 pillows / no am flare  SABA use: neb helps the most  rec Plan A = Automatic = symbicort 160 Take 2 puffs first thing in am and then another 2 puffs about 12 hours later.  Work on inhaler technique:  Plan B = Backup Only use your albuterol as a rescue medication Plan C = Crisis - only use your albuterol nebulizer if you first try Plan B and it fails to help  Plan D = Deltasone  If worse despite A thru C >  Try Deltasone = Prednisone 10 mg take  4 each am x 2 days,   2 each am x 2 days,  1 each am x 2 days and stop       11/08/2017  f/u ov/Kassie Keng re:  GOLD III / no better on symb 160 2bid  Chief Complaint  Patient presents with  . Follow-up    c/o sob with exertion, using rescue inhaler daily.   Dyspnea:  Not really much better, never used prednisone as rec  Cough: no Sleeping: bed flat 2 pillows  SABA use: about twice daily hfa and  neb  02: none  rec Go ahead and take the prednisone you have on hand> a lot better and maintained on symb 160 2bid   Work on inhaler technique:  Only use your albuterol as a rescue medication     12/04/2017  f/u ov/Marcoantonio Legault copd III  maint symb 160 2bid  Chief Complaint  Patient presents with  . Follow-up    Breathing is much improved since the last visit. She has not used her albuterol inhaler or neb.    Dyspnea:  MMRC2 = can't walk a nl pace on a flat grade s sob but does fine slow and flat  Cough: better Sleeping: ok bed flat 2 pillows  SABA use: min rec Work on inhaler technique:   Please schedule a follow up visit in 3 months but call sooner if needed  PFT's on return      Admit date: 12/31/2017 Discharge date: 01/02/2018  01/18/2018  f/u ov/Yordi Krager re:  Copd GOLD III  symbicort / spiriva handihaler and one day of prednisone Chief Complaint  Patient presents with  . Follow-up  Breathing is okay today. She has had another hospital stay since last visit for increased SOB. She has not use her rescue inhaler or neb since last hospitalization.   Dyspnea:  MMRC2 = can't walk a nl pace on a flat grade s sob but does fine slow and flat  Cough: none Sleeping:  Bed flat/ one pillow  SABA use: none now   02: none  Last flare occurred at work per H&P notes and she did not respond initially to saba    No obvious day to day or daytime variability or assoc excess/ purulent sputum or mucus plugs or hemoptysis or cp or chest tightness, subjective wheeze or overt sinus or hb symptoms.   Sleeping now  without nocturnal  or early am exacerbation  of respiratory  c/o's or need for noct saba. Also denies any obvious fluctuation of symptoms with weather or environmental changes or other aggravating or alleviating factors except as outlined above   No unusual exposure hx or h/o childhood pna/ asthma or knowledge of premature birth.  Current Allergies, Complete Past Medical History, Past  Surgical History, Family History, and Social History were reviewed in [employer] record.  ROS  The following are not active complaints unless bolded Hoarseness, sore throat, dysphagia, dental problems, itching, sneezing,  nasal congestion or discharge of excess mucus or purulent secretions, ear ache,   fever, chills, sweats, unintended wt loss or wt gain, classically pleuritic or exertional cp,  orthopnea pnd or arm/hand swelling  or leg swelling, presyncope, palpitations, abdominal pain, anorexia, nausea, vomiting, diarrhea  or change in bowel habits or change in bladder habits, change in stools or change in urine, dysuria, hematuria,  rash, arthralgias, visual complaints, headache, numbness, weakness or ataxia or problems with walking or coordination,  change in mood= anxious or  memory.        Current Meds  Medication Sig  . albuterol (PROAIR HFA) 108 (90 Base) MCG/ACT inhaler Inhale 2 puffs into the lungs every 6 (six) hours as needed for wheezing or shortness of breath.  . ALPRAZolam (XANAX) 0.25 MG tablet Take 0.25 mg by mouth daily as needed for anxiety.   Marland Kitchen atorvastatin (LIPITOR) 20 MG tablet Take 20 mg by mouth at bedtime.  . budesonide-formoterol (SYMBICORT) 160-4.5 MCG/ACT inhaler Inhale 2 puffs into the lungs 2 (two) times daily.  Marland Kitchen Dexmethylphenidate HCl 40 MG CP24 Take 40 mg by mouth at bedtime.  Marland Kitchen ipratropium-albuterol (DUONEB) 0.5-2.5 (3) MG/3ML SOLN Take 3 mLs by nebulization every 6 (six) hours as needed.  . lamoTRIgine (LAMICTAL) 200 MG tablet Take 200 mg by mouth at bedtime.   . predniSONE (DELTASONE) 10 MG tablet Prednisone 60 mg po daily for 3 days, then 40 mg po daily for 3 days, then 30 mg po daily for 3 days, then 20 mg po daily for 3 days and then 10 mg po daily for 3 days.  Marland Kitchen QUEtiapine (SEROQUEL) 25 MG tablet Take 50 mg by mouth at bedtime.  . risperiDONE (RISPERDAL) 1 MG tablet Take 3 mg by mouth at bedtime.   Marland Kitchen tiotropium (SPIRIVA HANDIHALER) 18  MCG inhalation capsule Place 1 capsule (18 mcg total) into inhaler and inhale daily.  Marland Kitchen venlafaxine XR (EFFEXOR-XR) 150 MG 24 hr capsule Take 150 mg by mouth at bedtime.   . Vitamin D, Ergocalciferol, (DRISDOL) 50000 units CAPS capsule Take 50,000 Units by mouth once a week. On Saturdays  Objective:     Pleasant amb obese wf nad    01/18/2018        199   12/04/2017     191   11/08/17 189 lb (85.7 kg)  10/31/17 187 lb (84.8 kg)  09/18/17 179 lb 0.2 oz (81.2 kg)     Vital signs reviewed - Note on arrival 02 sats  93% on RA     HEENT: full dentures / nl  turbinates bilaterally, and oropharynx. Nl external ear canals without cough reflex   NECK :  without JVD/Nodes/TM/ nl carotid upstrokes bilaterally   LUNGS: no acc muscle use,  slt barrel contour chest with diminished bs bilaterally with min exp rhonchi     CV:  RRR  no s3 or murmur or increase in P2, and no edema   ABD:  Obese soft and nontender with nl inspiratory excursion in the supine position. No bruits or organomegaly appreciated, bowel sounds nl  MS:  Nl gait/ ext warm without deformities, calf tenderness, cyanosis or clubbing No obvious joint restrictions   SKIN: warm and dry without lesions    NEURO:  alert, approp, nl sensorium with  no motor or cerebellar deficits apparent.     I personally reviewed images and agree with radiology impression as follows:  CXR:   12/31/17  Chronic bronchitic changes without infiltrate.      Assessment

## 2018-01-18 NOTE — Patient Instructions (Signed)
Plan A = Automatic = symbicort 160 Take 2 puffs and spiriva  first thing when you wake up  and then another 2 puffs of symbicort about 12 hours later. Also add Pantoprazole (protonix) 40 mg   Take  30-60 min before first meal of the day and Pepcid (famotidine)  20 mg one @  bedtime until return to office - this is the best way to tell whether stomach acid is contributing to your problem.     Work on inhaler technique:  relax and gently blow all the way out then take a nice smooth deep breath back in, triggering the inhaler at same time you start breathing in.  Hold for up to 5 seconds if you can. Blow out thru nose. Rinse and gargle with water when done     Plan B = Backup Only use your albuterol as a rescue medication to be used if you can't catch your breath by walking slower, resting or doing a relaxed purse lip breathing pattern.  - The less you use it, the better it will work when you need it. - Ok to use the inhaler up to 2 puffs  every 4 hours if you must but call for appointment if use goes up over your usual need - Don't leave home without it !!  (think of it like the spare tire for your car)   Plan C = Crisis - only use your albuterol nebulizer if you first try Plan B and it fails to help > ok to use the nebulizer up to every 4 hours but if start needing it regularly call for immediate appointment   Plan D = Deltasone  If worse despite A thru C >  Try Deltasone = Prednisone 10 mg take  4 each am x 2 days,   2 each am x 2 days,  1 each am x 2 days and stop      GERD (REFLUX)  is an extremely common cause of respiratory symptoms just like yours , many times with no obvious heartburn at all.    It can be treated with medication, but also with lifestyle changes including elevation of the head of your bed (ideally with 6 -8inch blocks under the headboard of your bed),  Smoking cessation, avoidance of late meals, excessive alcohol, and avoid fatty foods, chocolate, peppermint, colas, red  wine, and acidic juices such as orange juice.  NO MINT OR MENTHOL PRODUCTS SO NO COUGH DROPS  USE SUGARLESS CANDY INSTEAD (Jolley ranchers or Stover's or Life Savers) or even ice chips will also do - the key is to swallow to prevent all throat clearing. NO OIL BASED VITAMINS - use powdered substitutes.  Avoid fish oil when coughing.     Please keep appt for pfts  with all medications /inhalers/ solutions in hand so we can verify exactly what you are taking. This includes all medications from all doctors and over the counters

## 2018-01-18 NOTE — Assessment & Plan Note (Signed)
Quit smoking 08/2017  Spirometry 10/31/2017  FEV1 0.7 (30%)  Ratio 51 p Breo in am   - 10/31/2017  Eos = 0.9  - alpha one AT screen 10/31/2017   MS level 121  - 10/31/2017   Walked RA x one lap @ 185 stopped due to sob s desats at fast pace   - 01/18/2018   Walked RA  2 laps @ 278ft each @ fast  pace  stopped due to sob no desat   - Spirometry 01/18/2018  FEV1 1.1 (49%)  Ratio 63 with mild curvature after am spiriva and symbicort   - 01/18/2018  After extensive coaching inhaler device,  effectiveness =    75% (short Ti)     DDX of  difficult airways management almost all start with A and  include Adherence, Ace Inhibitors, Acid Reflux, Active Sinus Disease, Alpha 1 Antitripsin deficiency, Anxiety masquerading as Airways dz,  ABPA,  Allergy(esp in young), Aspiration (esp in elderly), Adverse effects of meds,  Active smoking or vaping, A bunch of PE's (a small clot burden can't cause this syndrome unless there is already severe underlying pulm or vascular dz with poor reserve) plus two Bs  = Bronchiectasis and Beta blocker use..and one C= CHF   Adherence is always the initial "prime suspect" and is a multilayered concern that requires a "trust but verify" approach in every patient - starting with knowing how to use medications, especially inhalers, correctly, keeping up with refills and understanding the fundamental difference between maintenance and prns vs those medications only taken for a very short course and then stopped and not refilled.  - see hfa teaching - return with all meds in hand using a trust but verify approach to confirm accurate Medication  Reconciliation The principal here is that until we are certain that the  patients are doing what we've asked, it makes no sense to ask them to do more.    ? Acid (or non-acid) GERD > always difficult to exclude as up to 75% of pts in some series report no assoc GI/ Heartburn symptoms> rec max (24h)  acid suppression and diet restrictions/ reviewed  and instructions given in writing.   ?? Anxiety > usually at the bottom of this list of usual suspects but should be much higher on this pt's based on H and P and note already on psychotropics and may interfere with adherence and also interpretation of response or lack thereof to symptom management which can be quite subjective.   ? Allergy/ with increased eos  > needs feno and allergy profile next ov and low threshold to activate plan D see avs for instructions unique to this ov    ? Alpha one def > already ruled out   ? Adverse drug effects > non listed   Clearly  Group D in terms of symptom/risk and laba/lama/ICS  therefore appropriate rx at this point so continue symb/ spiriva but change to smi on return with pfts as planned     I had an extended discussion with the patient reviewing all relevant studies completed to date and  lasting 25 minutes of a 40  minute transition of care office visit addressing recurrent  non-specific but potentially very serious refractory respiratory symptoms of uncertain and potentially multiple  Etiologies.   See device teaching which extended face to face time for this visit    Each maintenance medication was reviewed in detail including most importantly the difference between maintenance and prns and under what  circumstances the prns are to be triggered using an action plan format that is not reflected in the computer generated alphabetically organized AVS.    Please see AVS for specific instructions unique to this office visit that I personally wrote and verbalized to the the pt in detail and then reviewed with pt  by my nurse highlighting any changes in therapy/plan of care  recommended at today's visit.

## 2018-01-31 ENCOUNTER — Emergency Department (HOSPITAL_COMMUNITY): Payer: BLUE CROSS/BLUE SHIELD

## 2018-01-31 ENCOUNTER — Other Ambulatory Visit: Payer: Self-pay

## 2018-01-31 ENCOUNTER — Inpatient Hospital Stay (HOSPITAL_COMMUNITY)
Admission: EM | Admit: 2018-01-31 | Discharge: 2018-02-02 | DRG: 190 | Disposition: A | Discharge status: Home / Self Care | Payer: BLUE CROSS/BLUE SHIELD | Attending: Internal Medicine | Admitting: Internal Medicine

## 2018-01-31 ENCOUNTER — Encounter (HOSPITAL_COMMUNITY): Payer: Self-pay

## 2018-01-31 DIAGNOSIS — Z79899 Other long term (current) drug therapy: Secondary | ICD-10-CM

## 2018-01-31 DIAGNOSIS — E785 Hyperlipidemia, unspecified: Secondary | ICD-10-CM | POA: Diagnosis present

## 2018-01-31 DIAGNOSIS — F419 Anxiety disorder, unspecified: Secondary | ICD-10-CM | POA: Diagnosis present

## 2018-01-31 DIAGNOSIS — E538 Deficiency of other specified B group vitamins: Secondary | ICD-10-CM | POA: Diagnosis present

## 2018-01-31 DIAGNOSIS — D7589 Other specified diseases of blood and blood-forming organs: Secondary | ICD-10-CM | POA: Diagnosis present

## 2018-01-31 DIAGNOSIS — J441 Chronic obstructive pulmonary disease with (acute) exacerbation: Principal | ICD-10-CM | POA: Diagnosis present

## 2018-01-31 DIAGNOSIS — R718 Other abnormality of red blood cells: Secondary | ICD-10-CM | POA: Diagnosis present

## 2018-01-31 DIAGNOSIS — F909 Attention-deficit hyperactivity disorder, unspecified type: Secondary | ICD-10-CM | POA: Diagnosis present

## 2018-01-31 DIAGNOSIS — F319 Bipolar disorder, unspecified: Secondary | ICD-10-CM | POA: Diagnosis present

## 2018-01-31 DIAGNOSIS — Z882 Allergy status to sulfonamides status: Secondary | ICD-10-CM

## 2018-01-31 DIAGNOSIS — J9620 Acute and chronic respiratory failure, unspecified whether with hypoxia or hypercapnia: Secondary | ICD-10-CM | POA: Diagnosis present

## 2018-01-31 DIAGNOSIS — Z87891 Personal history of nicotine dependence: Secondary | ICD-10-CM | POA: Diagnosis not present

## 2018-01-31 DIAGNOSIS — J9621 Acute and chronic respiratory failure with hypoxia: Secondary | ICD-10-CM | POA: Diagnosis present

## 2018-01-31 DIAGNOSIS — Z23 Encounter for immunization: Secondary | ICD-10-CM | POA: Diagnosis not present

## 2018-01-31 DIAGNOSIS — Z7951 Long term (current) use of inhaled steroids: Secondary | ICD-10-CM

## 2018-01-31 DIAGNOSIS — F988 Other specified behavioral and emotional disorders with onset usually occurring in childhood and adolescence: Secondary | ICD-10-CM | POA: Diagnosis present

## 2018-01-31 HISTORY — DX: Unspecified asthma, uncomplicated: J45.909

## 2018-01-31 LAB — BLOOD GAS, VENOUS
Acid-Base Excess: 2.2 mmol/L — ABNORMAL HIGH (ref 0.0–2.0)
Bicarbonate: 27.2 mmol/L (ref 20.0–28.0)
FIO2: 21
O2 Saturation: 83.5 %
Patient temperature: 98.6
pCO2, Ven: 45.9 mmHg (ref 44.0–60.0)
pH, Ven: 7.39 (ref 7.250–7.430)
pO2, Ven: 46.7 mmHg — ABNORMAL HIGH (ref 32.0–45.0)

## 2018-01-31 LAB — FOLATE: Folate: 6.4 ng/mL (ref >5.9)

## 2018-01-31 LAB — MRSA PCR SCREENING: MRSA by PCR: POSITIVE — AB

## 2018-01-31 LAB — CBC
HCT: 40.7 % (ref 36.0–46.0)
Hemoglobin: 12.6 g/dL (ref 12.0–15.0)
MCH: 32.6 pg (ref 26.0–34.0)
MCHC: 31 g/dL (ref 30.0–36.0)
MCV: 105.4 fL — ABNORMAL HIGH (ref 80.0–100.0)
Platelets: 301 10*3/uL (ref 150–400)
RBC: 3.86 MIL/uL — ABNORMAL LOW (ref 3.87–5.11)
RDW: 13 % (ref 11.5–15.5)
WBC: 7.2 10*3/uL (ref 4.0–10.5)
nRBC: 0 % (ref 0.0–0.2)

## 2018-01-31 LAB — BASIC METABOLIC PANEL
Anion gap: 8 (ref 5–15)
BUN: 14 mg/dL (ref 8–23)
CO2: 27 mmol/L (ref 22–32)
Calcium: 10 mg/dL (ref 8.9–10.3)
Chloride: 104 mmol/L (ref 98–111)
Creatinine, Ser: 0.92 mg/dL (ref 0.44–1.00)
GFR calc Af Amer: >60 mL/min (ref >60)
GFR calc non Af Amer: >60 mL/min (ref >60)
Glucose, Bld: 105 mg/dL — ABNORMAL HIGH (ref 70–99)
Potassium: 4.3 mmol/L (ref 3.5–5.1)
SODIUM: 139 mmol/L (ref 135–145)

## 2018-01-31 LAB — PHOSPHORUS: Phosphorus: 3 mg/dL (ref 2.5–4.6)

## 2018-01-31 LAB — VITAMIN B12: Vitamin B-12: 95 pg/mL — ABNORMAL LOW (ref 180–914)

## 2018-01-31 LAB — MAGNESIUM: Magnesium: 2.3 mg/dL (ref 1.7–2.4)

## 2018-01-31 LAB — INFLUENZA PANEL BY PCR (TYPE A & B)
Influenza A By PCR: NEGATIVE
Influenza B By PCR: NEGATIVE

## 2018-01-31 MED ORDER — IPRATROPIUM BROMIDE 0.02 % IN SOLN
0.5000 mg | Freq: Once | RESPIRATORY_TRACT | Status: AC
  Administered 2018-01-31: 0.5 mg via RESPIRATORY_TRACT
  Filled 2018-01-31: qty 2.5

## 2018-01-31 MED ORDER — DEXMETHYLPHENIDATE HCL ER 20 MG PO CP24
40.0000 mg | ORAL_CAPSULE | Freq: Every day | ORAL | Status: DC
  Administered 2018-01-31 – 2018-02-01 (×2): 40 mg via ORAL
  Filled 2018-01-31 (×2): qty 2

## 2018-01-31 MED ORDER — ENOXAPARIN SODIUM 40 MG/0.4ML ~~LOC~~ SOLN: 40.0000 mg | SUBCUTANEOUS | Status: DC

## 2018-01-31 MED ORDER — IPRATROPIUM BROMIDE 0.02 % IN SOLN
0.5000 mg | Freq: Four times a day (QID) | RESPIRATORY_TRACT | Status: DC
  Administered 2018-01-31 (×3): 0.5 mg via RESPIRATORY_TRACT
  Filled 2018-01-31 (×3): qty 2.5

## 2018-01-31 MED ORDER — ALBUTEROL SULFATE (2.5 MG/3ML) 0.083% IN NEBU
5.0000 mg | INHALATION_SOLUTION | Freq: Once | RESPIRATORY_TRACT | Status: AC
  Administered 2018-01-31: 5 mg via RESPIRATORY_TRACT
  Filled 2018-01-31: qty 6

## 2018-01-31 MED ORDER — PANTOPRAZOLE SODIUM 40 MG PO TBEC
40.0000 mg | DELAYED_RELEASE_TABLET | Freq: Every day | ORAL | Status: DC
  Administered 2018-01-31 – 2018-02-02 (×3): 40 mg via ORAL
  Filled 2018-01-31 (×4): qty 1

## 2018-01-31 MED ORDER — ACETAMINOPHEN 325 MG PO TABS
650.0000 mg | ORAL_TABLET | Freq: Four times a day (QID) | ORAL | Status: DC | PRN
  Administered 2018-01-31 – 2018-02-01 (×2): 650 mg via ORAL
  Filled 2018-01-31 (×2): qty 2

## 2018-01-31 MED ORDER — MAGNESIUM SULFATE 2 GM/50ML IV SOLN
2.0000 g | Freq: Once | INTRAVENOUS | Status: AC
  Administered 2018-01-31: 2 g via INTRAVENOUS
  Filled 2018-01-31: qty 50

## 2018-01-31 MED ORDER — DM-GUAIFENESIN ER 30-600 MG PO TB12
1.0000 | ORAL_TABLET | Freq: Two times a day (BID) | ORAL | Status: DC | PRN
  Filled 2018-01-31: qty 1

## 2018-01-31 MED ORDER — LEVALBUTEROL HCL 1.25 MG/0.5ML IN NEBU
1.2500 mg | INHALATION_SOLUTION | Freq: Four times a day (QID) | RESPIRATORY_TRACT | Status: DC
  Administered 2018-01-31 (×4): 1.25 mg via RESPIRATORY_TRACT
  Filled 2018-01-31 (×3): qty 0.5

## 2018-01-31 MED ORDER — LEVALBUTEROL HCL 1.25 MG/0.5ML IN NEBU: 1.2500 mg | INHALATION_SOLUTION | Freq: Four times a day (QID) | RESPIRATORY_TRACT | Status: DC

## 2018-01-31 MED ORDER — IBUPROFEN 200 MG PO TABS
600.0000 mg | ORAL_TABLET | Freq: Once | ORAL | Status: AC
  Administered 2018-02-01: 600 mg via ORAL
  Filled 2018-01-31: qty 3

## 2018-01-31 MED ORDER — RISPERIDONE 1 MG PO TABS
3.0000 mg | ORAL_TABLET | Freq: Every day | ORAL | Status: DC
  Administered 2018-01-31 – 2018-02-01 (×2): 3 mg via ORAL
  Filled 2018-01-31 (×2): qty 3

## 2018-01-31 MED ORDER — LEVALBUTEROL HCL 0.63 MG/3ML IN NEBU: 0.6300 mg | INHALATION_SOLUTION | Freq: Four times a day (QID) | RESPIRATORY_TRACT | Status: DC | PRN

## 2018-01-31 MED ORDER — METHYLPREDNISOLONE SODIUM SUCC 125 MG IJ SOLR
125.0000 mg | Freq: Once | INTRAMUSCULAR | Status: AC
  Administered 2018-01-31: 125 mg via INTRAVENOUS
  Filled 2018-01-31: qty 2

## 2018-01-31 MED ORDER — MIDAZOLAM HCL 2 MG/2ML IJ SOLN
1.0000 mg | Freq: Once | INTRAMUSCULAR | Status: AC
  Administered 2018-01-31: 1 mg via INTRAVENOUS
  Filled 2018-01-31: qty 2

## 2018-01-31 MED ORDER — ALPRAZOLAM 0.25 MG PO TABS
0.2500 mg | ORAL_TABLET | Freq: Every day | ORAL | Status: DC | PRN
  Administered 2018-01-31: 0.25 mg via ORAL
  Filled 2018-01-31: qty 1

## 2018-01-31 MED ORDER — VENLAFAXINE HCL ER 150 MG PO CP24
150.0000 mg | ORAL_CAPSULE | Freq: Every day | ORAL | Status: DC
  Administered 2018-01-31 – 2018-02-01 (×2): 150 mg via ORAL
  Filled 2018-01-31 (×2): qty 1

## 2018-01-31 MED ORDER — PREDNISONE 20 MG PO TABS
40.0000 mg | ORAL_TABLET | Freq: Every day | ORAL | Status: DC
  Administered 2018-02-01 – 2018-02-02 (×2): 40 mg via ORAL
  Filled 2018-01-31 (×2): qty 2

## 2018-01-31 MED ORDER — LAMOTRIGINE 100 MG PO TABS
200.0000 mg | ORAL_TABLET | Freq: Every day | ORAL | Status: DC
  Administered 2018-01-31 – 2018-02-01 (×2): 200 mg via ORAL
  Filled 2018-01-31 (×2): qty 2

## 2018-01-31 MED ORDER — ATORVASTATIN CALCIUM 20 MG PO TABS
20.0000 mg | ORAL_TABLET | Freq: Every day | ORAL | Status: DC
  Administered 2018-01-31 – 2018-02-01 (×2): 20 mg via ORAL
  Filled 2018-01-31: qty 1
  Filled 2018-01-31: qty 2

## 2018-01-31 MED ORDER — METHYLPREDNISOLONE SODIUM SUCC 40 MG IJ SOLR
40.0000 mg | Freq: Four times a day (QID) | INTRAMUSCULAR | Status: AC
  Administered 2018-01-31 – 2018-02-01 (×4): 40 mg via INTRAVENOUS
  Filled 2018-01-31 (×4): qty 1

## 2018-01-31 MED ORDER — ACETAMINOPHEN 650 MG RE SUPP: 650.0000 mg | Freq: Four times a day (QID) | RECTAL | Status: DC | PRN

## 2018-01-31 MED ORDER — FAMOTIDINE 20 MG PO TABS
20.0000 mg | ORAL_TABLET | Freq: Every day | ORAL | Status: DC
  Administered 2018-01-31 – 2018-02-01 (×2): 20 mg via ORAL
  Filled 2018-01-31 (×2): qty 1

## 2018-01-31 MED ORDER — ENOXAPARIN SODIUM 40 MG/0.4ML ~~LOC~~ SOLN
40.0000 mg | SUBCUTANEOUS | Status: DC
  Administered 2018-01-31 – 2018-02-02 (×3): 40 mg via SUBCUTANEOUS
  Filled 2018-01-31 (×3): qty 0.4

## 2018-01-31 MED ORDER — MUPIROCIN 2 % EX OINT
TOPICAL_OINTMENT | Freq: Two times a day (BID) | CUTANEOUS | Status: DC
  Administered 2018-01-31 – 2018-02-02 (×5): via NASAL
  Filled 2018-01-31: qty 22

## 2018-01-31 MED ORDER — CYANOCOBALAMIN 1000 MCG/ML IJ SOLN
1000.0000 ug | Freq: Once | INTRAMUSCULAR | Status: AC
  Administered 2018-01-31: 1000 ug via INTRAMUSCULAR
  Filled 2018-01-31: qty 1

## 2018-01-31 MED ORDER — IPRATROPIUM BROMIDE 0.02 % IN SOLN
0.5000 mg | RESPIRATORY_TRACT | Status: DC
  Administered 2018-01-31: 0.5 mg via RESPIRATORY_TRACT
  Filled 2018-01-31: qty 2.5

## 2018-01-31 MED ORDER — QUETIAPINE FUMARATE 25 MG PO TABS
50.0000 mg | ORAL_TABLET | Freq: Every day | ORAL | Status: DC
  Administered 2018-01-31 – 2018-02-01 (×2): 50 mg via ORAL
  Filled 2018-01-31: qty 2
  Filled 2018-01-31: qty 1

## 2018-01-31 NOTE — ED Notes (Signed)
ED TO INPATIENT HANDOFF REPORT  Name/Age/Gender Julia Daniels 68 y.o. female  Code Status    Code Status Orders  (From admission, onward)         Start     Ordered   01/31/18 0729  Full code  Continuous     01/31/18 0730        Code Status History    Date Active Date Inactive Code Status Order ID Comments User Context   01/31/2018 0727 01/31/2018 0730 Full Code [id number]  Reubin Milan, MD ED   12/31/2017 1705 01/02/2018 2116 Full Code [id number]  Bonnell Public, MD Inpatient   09/18/2017 0510 09/19/2017 1455 Full Code [id number]  Alma Friendly, MD Inpatient   08/23/2017 1102 08/25/2017 1446 Full Code [id number]  Kerney Elbe, DO Inpatient      Home/SNF/Other Home  Chief Complaint Shortnesss of Breath   Level of Care/Admitting Diagnosis ED Disposition    ED Disposition Condition [facility] Area: [facility] [[id number]]  Level of Care: Stepdown [14]  Admit to SDU based on following criteria: Hemodynamic compromise or significant risk of instability:  Patient requiring short term acute titration and management of vasoactive drips, and invasive monitoring (i.e., CVP and Arterial line).  Diagnosis: COPD exacerbation [facility]) [[id number]]  Admitting Physician: Ivor Costa [[id number]]  Attending Physician: Ivor Costa [[id number]]  PT Class (Do Not Modify): Observation [104]  PT Acc Code (Do Not Modify): Observation [10022]       Medical History Past Medical History:  Diagnosis Date  . Anxiety   . Asthma   . COPD (chronic obstructive pulmonary disease) ([place])   . Depression     Allergies Allergies  Allergen Reactions  . Sulfa Antibiotics Other (See Comments)    Mouth gets raw    IV Location/Drains/Wounds Patient Lines/Drains/Airways Status   Active Line/Drains/Airways    Name:   Placement date:   Placement time:   Site:   Days:   Peripheral IV 12/31/17 Left Hand   12/31/17    1319    Hand   31   Peripheral IV 01/31/18  Left Arm   01/31/18    0155    Arm   less than 1   Wound 02/07/13 Laceration Finger (Comment which one) Left small 1 cm lac to ring finger   02/07/13    1128    Finger (Comment which one)   1819          Labs/Imaging Results for orders placed or performed during the hospital encounter of 01/31/18 (from the past 48 hour(s))  Basic metabolic panel     Status: Abnormal   Collection Time: 01/31/18  1:40 AM  Result Value Ref Range   Sodium 139 135 - 145 mmol/L   Potassium 4.3 3.5 - 5.1 mmol/L   Chloride 104 98 - 111 mmol/L   CO2 27 22 - 32 mmol/L   Glucose, Bld 105 (H) 70 - 99 mg/dL   BUN 14 8 - 23 mg/dL   Creatinine, Ser 0.92 0.44 - 1.00 mg/dL   Calcium 10.0 8.9 - 10.3 mg/dL   GFR calc non Af Amer >60 >60 mL/min   GFR calc Af Amer >60 >60 mL/min   Anion gap 8 5 - 15    Comment: Performed at [facility], [place] [address], [place], [place] [address]  CBC     Status: Abnormal   Collection Time: 01/31/18  1:40 AM  Result Value Ref  Range   WBC 7.2 4.0 - 10.5 K/uL   RBC 3.86 (L) 3.87 - 5.11 MIL/uL   Hemoglobin 12.6 12.0 - 15.0 g/dL   HCT 40.7 36.0 - 46.0 %   MCV 105.4 (H) 80.0 - 100.0 fL   MCH 32.6 26.0 - 34.0 pg   MCHC 31.0 30.0 - 36.0 g/dL   RDW 13.0 11.5 - 15.5 %   Platelets 301 150 - 400 K/uL   nRBC 0.0 0.0 - 0.2 %    Comment: Performed at [facility], [place] [address] [place], [place] [address]  Magnesium     Status: None   Collection Time: 01/31/18  1:40 AM  Result Value Ref Range   Magnesium 2.3 1.7 - 2.4 mg/dL    Comment: Performed at [facility], [place] [address], [place], [place] [address]  Phosphorus     Status: None   Collection Time: 01/31/18  1:40 AM  Result Value Ref Range   Phosphorus 3.0 2.5 - 4.6 mg/dL    Comment: Performed at [facility], [place] [address], [place], [place] [address]  Blood gas, venous     Status: Abnormal   Collection Time: 01/31/18  4:43 AM  Result Value Ref Range    FIO2 21.00    pH, Ven 7.390 7.250 - 7.430   pCO2, Ven 45.9 44.0 - 60.0 mmHg   pO2, Ven 46.7 (H) 32.0 - 45.0 mmHg   Bicarbonate 27.2 20.0 - 28.0 mmol/L   Acid-Base Excess 2.2 (H) 0.0 - 2.0 mmol/L   O2 Saturation 83.5 %   Patient temperature 98.6    Collection site VEIN    Drawn by COLLECTED BY NURSE    Sample type VENOUS     Comment: Performed at [place] [address] [place], [place] [address]   Dg Chest 2 View  Result Date: 01/31/2018 CLINICAL DATA:  Shortness of breath EXAM: CHEST - 2 VIEW COMPARISON:  12/31/2017 FINDINGS: Mild bronchitic changes. No focal consolidation or effusion. Normal heart size. No pneumothorax. Degenerative changes of the spine. IMPRESSION: No active cardiopulmonary disease.  Mild bronchitic changes Electronically Signed   By: Donavan Foil M.D.   On: 01/31/2018 02:41   None  Pending Labs Unresulted Labs (From admission, onward)    Start     Ordered   01/31/18 0731  Vitamin B12  Add-on,   R     01/31/18 0730   01/31/18 0559  Influenza panel by PCR (type A & B)  Once,   R     01/31/18 0558   01/31/18 0140  Folate  Once,   R     01/31/18 0140          Vitals/Pain Today's Vitals   01/31/18 0345 01/31/18 0518 01/31/18 0524 01/31/18 0712  BP: 107/83  137/88 122/75  Pulse: (!) 105 (!) 114 (!) 116 (!) 111  Resp: (!) 21 15 20 17   Temp:      TempSrc:    Oral  SpO2: (!) 89% 95% 95% 96%  PainSc:   0-No pain 0-No pain    Isolation Precautions No active isolations  Medications Medications  ipratropium (ATROVENT) nebulizer solution 0.5 mg (has no administration in time range)  dextromethorphan-guaiFENesin (MUCINEX DM) 30-600 MG per 12 hr tablet 1 tablet (has no administration in time range)  levalbuterol (XOPENEX) nebulizer solution 1.25 mg (has no administration in time range)  magnesium sulfate IVPB 2 g 50 mL (has no administration in time  range)  enoxaparin (LOVENOX) injection 40 mg (has no administration in time range)   methylPREDNISolone sodium succinate (SOLU-MEDROL) 40 mg/mL injection 40 mg (has no administration in time range)    Followed by  predniSONE (DELTASONE) tablet 40 mg (has no administration in time range)  enoxaparin (LOVENOX) injection 40 mg (has no administration in time range)  acetaminophen (TYLENOL) tablet 650 mg (has no administration in time range)    Or  acetaminophen (TYLENOL) suppository 650 mg (has no administration in time range)  levalbuterol (XOPENEX) nebulizer solution 0.63 mg (has no administration in time range)  albuterol (PROVENTIL) (2.5 MG/3ML) 0.083% nebulizer solution 5 mg (5 mg Nebulization Given 01/31/18 0204)  methylPREDNISolone sodium succinate (SOLU-MEDROL) 125 mg/2 mL injection 125 mg (125 mg Intravenous Given 01/31/18 0343)  albuterol (PROVENTIL) (2.5 MG/3ML) 0.083% nebulizer solution 5 mg (5 mg Nebulization Given 01/31/18 0344)  ipratropium (ATROVENT) nebulizer solution 0.5 mg (0.5 mg Nebulization Given 01/31/18 0344)  midazolam (VERSED) injection 1 mg (1 mg Intravenous Given 01/31/18 0521)    Mobility walks

## 2018-01-31 NOTE — Care Management (Signed)
This is a no charge note  Pending admission per PA, Mia  68 year old lady with past medical history of COPD, asthma, hyperlipidemia, GERD, depression with anxiety, who presents with shortness of breath, wheezing, oxygen desaturation to 89%, requiring BiPAP.  Clinically consistent with COPD exacerbation.  Chest x-ray negative.  Patient is placed on stepdown for observation.   Ivor Costa, MD  [facility]   If 7PM-7AM, please contact night-coverage [email or web address] Password [facility] 01/31/2018, 5:59 AM

## 2018-01-31 NOTE — ED Notes (Signed)
Pt complains of being short of breath since earlier tonight, no relief with nebulizer or inhaler Pt has asthma and was in the hospital over the holidays

## 2018-01-31 NOTE — ED Provider Notes (Signed)
[place] DEPT Provider Note   CSN: [id number] Arrival date & time: 01/31/18  0125     History   Chief Complaint Chief Complaint  Patient presents with  . Shortness of Breath    HPI Julia Daniels is a 68 y.o. female with a history of COPD, chronic respiratory failure, anxiety who presents to the emergency department with a chief complaint of shortness of breath.  Patient endorses sudden onset shortness of breath that has been worsening since onset this morning.  She reports that she used her albuterol inhaler at home around 7 PM with minimal improvement so she used her nebulizer around 8 PM.  She states that this helped with her breathing little more and she was able to get dressed and go to work.  She reports that when she got to work that she was winded from the time that she walked into the building from the parking lot so she used another 2 puffs of her albuterol inhaler.  She reports that she was sitting at her desk when her wheezing significantly worsened and at around 1145 she got up and was even more short of breath than previously so she used her nebulizer again at work without improvement and asked her friend to bring her to the ER for further work-up and evaluation.  She reports an associated nonproductive cough.  She reports that she has been compliant with her home Breo and Symbicort.  She is followed by Dr. Melvyn Novas with pulmonology.  She denies chest pain, leg swelling, palpitations, headache, fever, chills, back pain, abdominal pain, nausea, vomiting, diarrhea.  The history is provided by the patient. No language interpreter was used.    Past Medical History:  Diagnosis Date  . Anxiety   . Asthma   . COPD (chronic obstructive pulmonary disease) ([place])   . Depression     Patient Active Problem List   Diagnosis Date Noted  . Elevated MCV 01/31/2018  . Acute on chronic respiratory failure ([place]) 12/31/2017  . Respiratory failure,  acute-on-chronic ([place]) 12/31/2017  . COPD exacerbation ([place]) 09/18/2017  . Hypoxia   . Hyperlipidemia 08/23/2017  . Depression 08/23/2017  . Anxiety 08/23/2017  . Hypercalcemia 08/23/2017  . Thrombocytosis ([place]) 08/23/2017  . COPD with acute exacerbation ([place]) 08/23/2017  . Acute respiratory failure with hypoxia and hypercapnia ([place]) 08/23/2017  . ADD (attention deficit disorder) 08/30/2012  . Bipolar disorder, unspecified ([place]) 08/30/2012  . COPD  GOLD ? III with increased Eos 08/30/2012  . Tobacco abuse 08/30/2012    Past Surgical History:  Procedure Laterality Date  . ABDOMINAL HYSTERECTOMY     partial      OB History   No obstetric history on file.      Home Medications    Prior to Admission medications   Medication Sig Start Date End Date Taking? Authorizing Provider  albuterol (PROAIR HFA) 108 (90 Base) MCG/ACT inhaler Inhale 2 puffs into the lungs every 6 (six) hours as needed for wheezing or shortness of breath.   Yes [provider]  ALPRAZolam (XANAX) 0.25 MG tablet Take 0.25 mg by mouth daily as needed for anxiety.  07/21/17  Yes [provider]  atorvastatin (LIPITOR) 20 MG tablet Take 20 mg by mouth at bedtime. 07/27/17  Yes [provider]  budesonide-formoterol (SYMBICORT) 160-4.5 MCG/ACT inhaler Inhale 2 puffs into the lungs 2 (two) times daily. 10/31/17  Yes Tanda Rockers, MD  Dexmethylphenidate HCl 40 MG CP24 Take 40 mg by mouth  at bedtime. 07/21/17  Yes [provider]  famotidine (PEPCID) 20 MG tablet One at bedtime 01/18/18  Yes Tanda Rockers, MD  ipratropium-albuterol (DUONEB) 0.5-2.5 (3) MG/3ML SOLN Take 3 mLs by nebulization every 6 (six) hours as needed. Patient taking differently: Take 3 mLs by nebulization every 6 (six) hours as needed (sob).  01/02/18  Yes Dana Allan I, MD  lamoTRIgine (LAMICTAL) 200 MG tablet Take 200 mg by mouth at bedtime.    Yes [provider]  pantoprazole (PROTONIX) 40 MG  tablet Take 1 tablet (40 mg total) by mouth daily. Take 30-60 min before first meal of the day 01/18/18  Yes Tanda Rockers, MD  QUEtiapine (SEROQUEL) 25 MG tablet Take 50 mg by mouth at bedtime.   Yes [provider]  risperiDONE (RISPERDAL) 1 MG tablet Take 3 mg by mouth at bedtime.  07/06/17  Yes [provider]  tiotropium (SPIRIVA HANDIHALER) 18 MCG inhalation capsule Place 1 capsule (18 mcg total) into inhaler and inhale daily. 01/02/18 01/02/19 Yes Dana Allan I, MD  venlafaxine XR (EFFEXOR-XR) 150 MG 24 hr capsule Take 150 mg by mouth at bedtime.    Yes [provider]  Vitamin D, Ergocalciferol, (DRISDOL) 50000 units CAPS capsule Take 50,000 Units by mouth once a week. On Saturdays 06/18/17  Yes [provider]  predniSONE (DELTASONE) 10 MG tablet Prednisone 60 mg po daily for 3 days, then 40 mg po daily for 3 days, then 30 mg po daily for 3 days, then 20 mg po daily for 3 days and then 10 mg po daily for 3 days. Patient not taking: Reported on 01/31/2018 01/02/18   Bonnell Public, MD    Family History Family History  Problem Relation Age of Onset  . Asthma Mother   . Lung cancer Father   . COPD Father     Social History Social History   Tobacco Use  . Smoking status: Former Smoker    Packs/day: 1.00    Years: 40.00    Pack years: 40.00    Types: Cigarettes    Last attempt to quit: 08/23/2017    Years since quitting: 0.4  . Smokeless tobacco: Never Used  Substance Use Topics  . Alcohol use: No  . Drug use: No     Allergies   Sulfa antibiotics   Review of Systems Review of Systems  Constitutional: Negative for activity change, chills and fever.  HENT: Negative for congestion and sore throat.   Eyes: Negative for visual disturbance.  Respiratory: Positive for cough, shortness of breath and wheezing. Negative for stridor.   Cardiovascular: Negative for chest pain, palpitations and leg swelling.  Gastrointestinal: Negative  for abdominal pain, constipation, diarrhea, nausea and vomiting.  Genitourinary: Negative for dysuria.  Musculoskeletal: Negative for arthralgias, back pain, myalgias, neck pain and neck stiffness.  Skin: Negative for rash.  Allergic/Immunologic: Negative for immunocompromised state.  Neurological: Negative for dizziness, tremors, weakness and headaches.  Psychiatric/Behavioral: Negative for confusion.   Physical Exam Updated Vital Signs BP 122/75 (BP Location: Right Arm)   Pulse (!) 111   Temp 98.7 F (37.1 C) (Oral)   Resp 17   SpO2 97%   Physical Exam Vitals signs and nursing note reviewed.  Constitutional:      General: She is in acute distress.     Appearance: She is not ill-appearing, toxic-appearing or diaphoretic.  HENT:     Head: Normocephalic.  Eyes:     Conjunctiva/sclera: Conjunctivae normal.  Neck:  Musculoskeletal: Neck supple.  Cardiovascular:     Rate and Rhythm: Normal rate and regular rhythm.     Heart sounds: No murmur. No friction rub. No gallop.   Pulmonary:     Effort: Pulmonary effort is normal. No respiratory distress.     Breath sounds: Wheezing present. No rhonchi or rales.     Comments: Diffuse inspiratory and expiratory wheezes throughout.  She has accessory muscle use.  She has tachypnea. Gasps for breath every 2-3 words in a sentence.  Chest:     Chest wall: No deformity, tenderness, crepitus or edema.  Abdominal:     General: There is no distension.     Palpations: Abdomen is soft.  Musculoskeletal:     Right lower leg: She exhibits no tenderness. No edema.     Left lower leg: She exhibits no tenderness. No edema.  Skin:    General: Skin is warm.     Findings: No rash.  Neurological:     Mental Status: She is alert.  Psychiatric:        Behavior: Behavior normal.      ED Treatments / Results  Labs (all labs ordered are listed, but only abnormal results are displayed) Labs Reviewed  BASIC METABOLIC PANEL - Abnormal; Notable  for the following components:      Result Value   Glucose, Bld 105 (*)    All other components within normal limits  CBC - Abnormal; Notable for the following components:   RBC 3.86 (*)    MCV 105.4 (*)    All other components within normal limits  BLOOD GAS, VENOUS - Abnormal; Notable for the following components:   pO2, Ven 46.7 (*)    Acid-Base Excess 2.2 (*)    All other components within normal limits  VITAMIN B12 - Abnormal; Notable for the following components:   Vitamin B-12 95 (*)    All other components within normal limits  MRSA PCR SCREENING  MAGNESIUM  PHOSPHORUS  FOLATE  INFLUENZA PANEL BY PCR (TYPE A & B)    EKG None  Radiology Dg Chest 2 View  Result Date: 01/31/2018 CLINICAL DATA:  Shortness of breath EXAM: CHEST - 2 VIEW COMPARISON:  12/31/2017 FINDINGS: Mild bronchitic changes. No focal consolidation or effusion. Normal heart size. No pneumothorax. Degenerative changes of the spine. IMPRESSION: No active cardiopulmonary disease.  Mild bronchitic changes Electronically Signed   By: Donavan Foil M.D.   On: 01/31/2018 02:41    Procedures .Critical Care Performed by: Joanne Gavel, PA-C Authorized by: Joanne Gavel, PA-C   Critical care provider statement:    Critical care time (minutes):  50   Critical care time was exclusive of:  Separately billable procedures and treating other patients and teaching time   Critical care was necessary to treat or prevent imminent or life-threatening deterioration of the following conditions:  Respiratory failure   Critical care was time spent personally by me on the following activities:  Ordering and performing treatments and interventions, ordering and review of laboratory studies, ordering and review of radiographic studies, pulse oximetry, re-evaluation of patient's condition, obtaining history from patient or surrogate, evaluation of patient's response to treatment, development of treatment plan with patient or  surrogate, review of old charts and examination of patient   I assumed direction of critical care for this patient from another provider in my specialty: no     (including critical care time)  Medications Ordered in ED Medications  dextromethorphan-guaiFENesin ([place] DM) 30-600  MG per 12 hr tablet 1 tablet (has no administration in time range)  levalbuterol (XOPENEX) nebulizer solution 1.25 mg (1.25 mg Nebulization Given 01/31/18 0823)  magnesium sulfate IVPB 2 g 50 mL (2 g Intravenous New Bag/Given 01/31/18 0911)  methylPREDNISolone sodium succinate (SOLU-MEDROL) 40 mg/mL injection 40 mg (has no administration in time range)    Followed by  predniSONE (DELTASONE) tablet 40 mg (has no administration in time range)  enoxaparin (LOVENOX) injection 40 mg (has no administration in time range)  acetaminophen (TYLENOL) tablet 650 mg (has no administration in time range)    Or  acetaminophen (TYLENOL) suppository 650 mg (has no administration in time range)  levalbuterol (XOPENEX) nebulizer solution 0.63 mg (has no administration in time range)  ALPRAZolam (XANAX) tablet 0.25 mg (has no administration in time range)  atorvastatin (LIPITOR) tablet 20 mg (has no administration in time range)  dexmethylphenidate (FOCALIN XR) 24 hr capsule 40 mg (has no administration in time range)  famotidine (PEPCID) tablet 20 mg (has no administration in time range)  lamoTRIgine (LAMICTAL) tablet 200 mg (has no administration in time range)  pantoprazole (PROTONIX) EC tablet 40 mg (has no administration in time range)  QUEtiapine (SEROQUEL) tablet 50 mg (has no administration in time range)  risperiDONE (RISPERDAL) tablet 3 mg (has no administration in time range)  venlafaxine XR (EFFEXOR-XR) 24 hr capsule 150 mg (has no administration in time range)  ipratropium (ATROVENT) nebulizer solution 0.5 mg (has no administration in time range)  albuterol (PROVENTIL) (2.5 MG/3ML) 0.083% nebulizer solution 5 mg (5 mg  Nebulization Given 01/31/18 0204)  methylPREDNISolone sodium succinate (SOLU-MEDROL) 125 mg/2 mL injection 125 mg (125 mg Intravenous Given 01/31/18 0343)  albuterol (PROVENTIL) (2.5 MG/3ML) 0.083% nebulizer solution 5 mg (5 mg Nebulization Given 01/31/18 0344)  ipratropium (ATROVENT) nebulizer solution 0.5 mg (0.5 mg Nebulization Given 01/31/18 0344)  midazolam (VERSED) injection 1 mg (1 mg Intravenous Given 01/31/18 0521)     Initial Impression / Assessment and Plan / ED Course  I have reviewed the triage vital signs and the nursing notes.  Pertinent labs & imaging results that were available during my care of the patient were reviewed by me and considered in my medical decision making (see chart for details).     68 year old female with a history of COPD, chronic respiratory failure, anxiety setting with rapid onset, rapidly worsening shortness of breath, onset in the last 24 hours.  She was becoming short of breath with minimal exertion or ambulation.  My exam, she is taking a breath every 2-3 words in the middle of a sentence and has diffuse inspiratory and expiratory wheezes throughout despite numerous nebulizer treatments both in the ER and prior to arrival.  She appears distressed.  Solu-Medrol given.  Venous blood gas with pH of 7.39.  SaO2 decreased to 88% on room air while the patient was sitting, and the patient reports that she felt felt so short of breath that she did not feel comfortable ambulating in the ED.  Will place the patient on BiPAP after discussing the patient with Dr. Kathrynn Humble who independently evaluated the patient.  Chest x-ray with mild bronchitic changes, but no evidence of pneumonia or pneumothorax.  Labs are otherwise reassuring.  Low suspicion for ACS or PE.  On reassessment, after BiPAP was initiated, the patient reports that she was feeling much improved.  Given the patient's continued need for BiPAP, a hospitalist team was consulted. Dr. Blaine Hamper will accept the patient for  admission. The patient appears  reasonably stabilized for admission considering the current resources, flow, and capabilities available in the ED at this time, and I doubt any other [facility] requiring further screening and/or treatment in the ED prior to admission.  Final Clinical Impressions(s) / ED Diagnoses   Final diagnoses:  COPD exacerbation [facility])    ED Discharge Orders    None       Evert Wenrich A, PA-C 01/31/18 2787    Varney Biles, MD 02/01/18 [id number]

## 2018-01-31 NOTE — ED Notes (Signed)
Respiratory in room.

## 2018-01-31 NOTE — ED Notes (Signed)
Calling respiratory

## 2018-01-31 NOTE — ED Notes (Signed)
Pt does not want to walk at this time

## 2018-01-31 NOTE — H&P (Signed)
History and Physical    RAYLEI LOSURDO OEU:[id number] DOB: December 07, 1950 DOA: 01/31/2018  [facility]P: [facility] [place]  Patient coming from: Her workplace.  I have personally briefly reviewed patient's old medical records in [place]  Chief Complaint: Shortness of breath.  HPI: Julia Daniels is a 68 y.o. female with medical history significant of anxiety, depression, bipolar disorder, asthma, hyperlipidemia who is coming to the emergency department with complaints of shortness of breath since around 1900 yesterday.  She is states that yesterday evening she was trying to get ready to go to work when she started feeling dyspneic and developed some wheezing.  She used her rescue inhaler which helped for about 40 minutes.  After that she uses a nebulizer treatment and was able to go to work.  Around 930, she had to had another neb treatment at work.  She was able to continue to work until about 0045, when she was unable to tolerate symptoms further and had to come to the emergency department.  She denies fever, chills, chest pain, dizziness, rhinorrhea or productive cough.  She had mild sore throat due to dryness, which disappeared after she has some water to drink.  She denies hemoptysis, diaphoresis, PND, orthopnea or pitting edema of the lower extremities.  She complains of having crampy abdominal pain and several episodes of diarrhea daily earlier this week for 2 to 3 days.  She denies nausea, emesis, constipation, melena or hematochezia.  No dysuria, frequency or hematuria.  ED Course: Her initial vital signs showed a temperature is 98.8 F, pulse 124, respirations 22, blood pressure 178/95 mmHg and O2 sat 97% on room air.  Patient received supplemental oxygen, 2 albuterol 5 mg neb treatments and ipratropium 0.5 mg x 1.  She also received midazolam 1 mg IVP due to anxiety and methylprednisolone 125 mg IVP.  Her CBC showed a white count was 7.2, hemoglobin 12.6 g/dL with an  MCV of 105.4 fL and platelets 301.  BMP showed a glucose of 105 mg/dL, but all other values are within normal limits.  VBG shows a PO2 of 46.7 mmHg and in that acid-basis as of 2.2 mmol/L.  All other values are within normal limits.  Chest radiograph shows mild bronchitic changes, but no active cardiopulmonary disease.  Review of Systems: As per HPI otherwise 10 point review of systems negative.   Past Medical History:  Diagnosis Date  . Anxiety   . Asthma   . COPD (chronic obstructive pulmonary disease) ([place])   . Depression     Past Surgical History:  Procedure Laterality Date  . ABDOMINAL HYSTERECTOMY     partial      reports that she quit smoking about 5 months ago. Her smoking use included cigarettes. She has a 40.00 pack-year smoking history. She has never used smokeless tobacco. She reports that she does not drink alcohol or use drugs.  Allergies  Allergen Reactions  . Sulfa Antibiotics Other (See Comments)    Mouth gets raw    Family History  Problem Relation Age of Onset  . Asthma Mother   . Lung cancer Father   . COPD Father    Prior to Admission medications   Medication Sig Start Date End Date Taking? Authorizing Provider  albuterol (PROAIR HFA) 108 (90 Base) MCG/ACT inhaler Inhale 2 puffs into the lungs every 6 (six) hours as needed for wheezing or shortness of breath.   Yes [provider]  ALPRAZolam (XANAX) 0.25 MG tablet Take 0.25  mg by mouth daily as needed for anxiety.  07/21/17  Yes [provider]  atorvastatin (LIPITOR) 20 MG tablet Take 20 mg by mouth at bedtime. 07/27/17  Yes [provider]  budesonide-formoterol (SYMBICORT) 160-4.5 MCG/ACT inhaler Inhale 2 puffs into the lungs 2 (two) times daily. 10/31/17  Yes Tanda Rockers, MD  Dexmethylphenidate HCl 40 MG CP24 Take 40 mg by mouth at bedtime. 07/21/17  Yes [provider]  famotidine (PEPCID) 20 MG tablet One at bedtime 01/18/18  Yes Tanda Rockers, MD    ipratropium-albuterol (DUONEB) 0.5-2.5 (3) MG/3ML SOLN Take 3 mLs by nebulization every 6 (six) hours as needed. Patient taking differently: Take 3 mLs by nebulization every 6 (six) hours as needed (sob).  01/02/18  Yes Dana Allan I, MD  lamoTRIgine (LAMICTAL) 200 MG tablet Take 200 mg by mouth at bedtime.    Yes [provider]  pantoprazole (PROTONIX) 40 MG tablet Take 1 tablet (40 mg total) by mouth daily. Take 30-60 min before first meal of the day 01/18/18  Yes Tanda Rockers, MD  QUEtiapine (SEROQUEL) 25 MG tablet Take 50 mg by mouth at bedtime.   Yes [provider]  risperiDONE (RISPERDAL) 1 MG tablet Take 3 mg by mouth at bedtime.  07/06/17  Yes [provider]  tiotropium (SPIRIVA HANDIHALER) 18 MCG inhalation capsule Place 1 capsule (18 mcg total) into inhaler and inhale daily. 01/02/18 01/02/19 Yes Dana Allan I, MD  venlafaxine XR (EFFEXOR-XR) 150 MG 24 hr capsule Take 150 mg by mouth at bedtime.    Yes [provider]  Vitamin D, Ergocalciferol, (DRISDOL) 50000 units CAPS capsule Take 50,000 Units by mouth once a week. On Saturdays 06/18/17  Yes [provider]  predniSONE (DELTASONE) 10 MG tablet Prednisone 60 mg po daily for 3 days, then 40 mg po daily for 3 days, then 30 mg po daily for 3 days, then 20 mg po daily for 3 days and then 10 mg po daily for 3 days. Patient not taking: Reported on 01/31/2018 01/02/18   Dana Allan I, MD    Physical Exam: Vitals:   01/31/18 9381 01/31/18 0712 01/31/18 0755 01/31/18 0800  BP: 137/88 122/75    Pulse: (!) 116 (!) 111    Resp: 20 17    Temp:    98.3 F (36.8 C)  TempSrc:  Oral  Oral  SpO2: 95% 96% 97%     Constitutional: NAD, calm, comfortable Eyes: PERRL, lids and conjunctivae normal ENMT: Nasal cannula in place.  Mucous membranes are mildly dry. Posterior pharynx clear of any exudate or lesions. Neck: normal, supple, no masses, no thyromegaly Respiratory: Decreased  breath sounds with bilateral wheezing, no crackles. Normal respiratory effort. No accessory muscle use.  Cardiovascular: Sinus tachycardia 106 bpm, no murmurs / rubs / gallops. No extremity edema. 2+ pedal pulses. No carotid bruits.  Abdomen: Soft, no tenderness, no masses palpated. No hepatosplenomegaly. Bowel sounds positive.  Musculoskeletal: no clubbing / cyanosis.  Good ROM, no contractures. Normal muscle tone.  Skin: no rashes, lesions, ulcers on limited dermatological examination. Neurologic: CN 2-12 grossly intact. Sensation intact, DTR normal. Strength 5/5 in all 4.  Psychiatric: Normal judgment and insight. Alert and oriented x 3. Normal mood.    Labs on Admission: I have personally reviewed following labs and imaging studies  CBC: Recent Labs  Lab 01/31/18 0140  WBC 7.2  HGB 12.6  HCT 40.7  MCV 105.4*  PLT 829   Basic Metabolic Panel:  Recent Labs  Lab 01/31/18 0140  NA 139  K 4.3  CL 104  CO2 27  GLUCOSE 105*  BUN 14  CREATININE 0.92  CALCIUM 10.0  MG 2.3  PHOS 3.0   GFR: Estimated Creatinine Clearance: 65.2 mL/min (by C-G formula based on SCr of 0.92 mg/dL). Liver Function Tests: No results for input(s): AST, ALT, ALKPHOS, BILITOT, PROT, ALBUMIN in the last 168 hours. No results for input(s): LIPASE, AMYLASE in the last 168 hours. No results for input(s): AMMONIA in the last 168 hours. Coagulation Profile: No results for input(s): INR, PROTIME in the last 168 hours. Cardiac Enzymes: No results for input(s): CKTOTAL, CKMB, CKMBINDEX, TROPONINI in the last 168 hours. BNP (last 3 results) No results for input(s): PROBNP in the last 8760 hours. HbA1C: No results for input(s): HGBA1C in the last 72 hours. CBG: No results for input(s): GLUCAP in the last 168 hours. Lipid Profile: No results for input(s): CHOL, HDL, LDLCALC, TRIG, CHOLHDL, LDLDIRECT in the last 72 hours. Thyroid Function Tests: No results for input(s): TSH, T4TOTAL, FREET4, T3FREE,  THYROIDAB in the last 72 hours. Anemia Panel: Recent Labs    01/31/18 0140  VITAMINB12 95*  FOLATE 6.4   Urine analysis:    Component Value Date/Time   BILIRUBINUR n 09/23/2012 1111   PROTEINUR n 09/23/2012 1111   UROBILINOGEN 0.2 09/23/2012 1111   NITRITE n 09/23/2012 1111   LEUKOCYTESUR Negative 09/23/2012 1111    Radiological Exams on Admission: Dg Chest 2 View  Result Date: 01/31/2018 CLINICAL DATA:  Shortness of breath EXAM: CHEST - 2 VIEW COMPARISON:  12/31/2017 FINDINGS: Mild bronchitic changes. No focal consolidation or effusion. Normal heart size. No pneumothorax. Degenerative changes of the spine. IMPRESSION: No active cardiopulmonary disease.  Mild bronchitic changes Electronically Signed   By: Donavan Foil M.D.   On: 01/31/2018 02:41    EKG: Independently reviewed.    Assessment/Plan Principal Problem:   COPD exacerbation (HCC)   Acute on chronic respiratory failure with hypoxia (HCC) Stepdown/observation. Continue supplemental oxygen. Continue BiPAP ventilation as needed. Continue Xopenex 1.25 mg via neb every 6 hours. Continue ipratropium 0.5 mg neb every 6 hours. Xopenex 0.63 mg via neb every 6 hours as needed. Solu-Medrol 40 mg IVP every 6 hours.  Active Problems:   ADD (attention deficit disorder) Continue dexmethylphenidate 40 mg p.o. at bedtime.    Bipolar disorder, unspecified (HCC) Continue Lamictal 200 mg p.o. at bedtime. Continue Seroquel 50 mg p.o. at bedtime. Continue Risperdal 3 mg p.o. at bedtime.    Anxiety Continue Effexor 150 mg p.o. at bedtime. Continue alprazolam 0.25 mg p.o. daily for anxiety.    Hyperlipidemia Continue atorvastatin 20 mg p.o. daily.    Elevated MCV Check B76 and folic acid level.    Vitamin B12 deficiency I started supplementation IM.   DVT prophylaxis: Lovenox SQ. Code Status: Full code. Family Communication:  Disposition Plan: Observation/stepdown. Consults called: Admission status:  Observation/stepdown.   Reubin Milan MD [facility]  01/31/2018, 9:38 AM

## 2018-02-01 DIAGNOSIS — J441 Chronic obstructive pulmonary disease with (acute) exacerbation: Principal | ICD-10-CM

## 2018-02-01 MED ORDER — TIOTROPIUM BROMIDE MONOHYDRATE 18 MCG IN CAPS: 18.0000 ug | ORAL_CAPSULE | Freq: Every day | RESPIRATORY_TRACT | Status: DC

## 2018-02-01 MED ORDER — MOMETASONE FURO-FORMOTEROL FUM 200-5 MCG/ACT IN AERO
2.0000 | INHALATION_SPRAY | Freq: Two times a day (BID) | RESPIRATORY_TRACT | Status: DC
  Administered 2018-02-01 – 2018-02-02 (×3): 2 via RESPIRATORY_TRACT
  Filled 2018-02-01 (×2): qty 8.8

## 2018-02-01 MED ORDER — UMECLIDINIUM BROMIDE 62.5 MCG/INH IN AEPB
1.0000 | INHALATION_SPRAY | Freq: Every day | RESPIRATORY_TRACT | Status: DC
  Administered 2018-02-01 – 2018-02-02 (×2): 1 via RESPIRATORY_TRACT
  Filled 2018-02-01 (×2): qty 7

## 2018-02-01 MED ORDER — IPRATROPIUM-ALBUTEROL 0.5-2.5 (3) MG/3ML IN SOLN: 3.0000 mL | Freq: Four times a day (QID) | RESPIRATORY_TRACT | Status: DC | PRN

## 2018-02-01 MED ORDER — IPRATROPIUM-ALBUTEROL 0.5-2.5 (3) MG/3ML IN SOLN
3.0000 mL | Freq: Three times a day (TID) | RESPIRATORY_TRACT | Status: DC
  Administered 2018-02-01 – 2018-02-02 (×4): 3 mL via RESPIRATORY_TRACT
  Filled 2018-02-01 (×4): qty 3

## 2018-02-01 MED ORDER — VITAMIN D (ERGOCALCIFEROL) 1.25 MG (50000 UNIT) PO CAPS
50000.0000 [IU] | ORAL_CAPSULE | ORAL | Status: DC
  Administered 2018-02-02: 50000 [IU] via ORAL
  Filled 2018-02-01: qty 1

## 2018-02-01 MED ORDER — VITAMIN B-12 1000 MCG PO TABS
1000.0000 ug | ORAL_TABLET | Freq: Every day | ORAL | Status: DC
  Administered 2018-02-01 – 2018-02-02 (×2): 1000 ug via ORAL
  Filled 2018-02-01 (×2): qty 1

## 2018-02-01 MED ORDER — ORAL CARE MOUTH RINSE
15.0000 mL | Freq: Two times a day (BID) | OROMUCOSAL | Status: DC
  Administered 2018-02-01 – 2018-02-02 (×3): 15 mL via OROMUCOSAL

## 2018-02-01 NOTE — Progress Notes (Signed)
PROGRESS NOTE    Julia Daniels  QHU:[id number] DOB: [birth year] DOA: 01/31/2018 PCP: [facility]    Brief Narrative:  68 y.o. 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   Assessment & Plan:   Principal Problem:   COPD exacerbation ([place]) Active Problems:   ADD (attention deficit disorder)   Bipolar disorder, unspecified ([place])   Hyperlipidemia   Anxiety   Acute on chronic respiratory failure (HCC)   Elevated MCV   Vitamin B12 deficiency   Acute on chronic respiratory failure with hypoxia (HCC)   #) Acute stage II COPD exacerbation: Patient appears to still have significant amounts of wheezing and shortness of breath and several dyspnea. -Continue IV methylprednisolone and then transition to oral prednisone -Continue every 6 hours scheduled bronchodilators and PRN -Continue LABA/ICS, L AMA -Congratulated patient on smoking cessation  #) Hyperlipidemia -Continue atorvastatin 20 mg daily  #) Macrocytosis: Patient is profoundly low B12 at 95.  Patient did receive 1 injection -Start oral B12 supplementation -Plan to recheck in 1 month  #) Bipolar 1 disorder: - Continue lamotrigine 200 mg nightly - Continue risperidone 3 mg nightly  #) Anxiety/depression/ADHD: -Continue PRN alprazolam - Continue Focalin -Continue quetiapine 50 mg nightly -Continue venlafaxine 150 mg nightly  Moods: Tolerating p.o. Electrolytes: Monitor and supplement Nutrition: Heart healthy diet   Prophylaxis: Enoxaparin   Disposition: Pending improved respiratory status  Full code  Consultants:   None  Procedures:  None  Antimicrobials:   None   Subjective: Patient reports continued cough and shortness of breath.  She reports she is somewhat better than when  she came in.  She denies any nausea, vomiting, diarrhea, cough, congestion.  She continues to report several word dyspnea.  Objective: Vitals:   02/01/18 0400 02/01/18 0500 02/01/18 0623 02/01/18 0800  BP: 122/70 93/71    Pulse: 100 94    Resp: 11 16    Temp: 98 F (36.7 C)   97.6 F (36.4 C)  TempSrc: Oral   Oral  SpO2: 93% 94% 96%   Weight:      Height:        Intake/Output Summary (Last 24 hours) at 02/01/2018 0949 Last data filed at 01/31/2018 1600 Gross per 24 hour  Intake 480 ml  Output -  Net 480 ml   Filed Weights   01/31/18 1310  Weight: 87.3 kg    Examination:  General exam: Mild distress, can only complete 3-4 word sentences Respiratory system: Moderately increased work of breathing, scattered rhonchi and diffuse wheezing worse on right, no crackles Cardiovascular system: Regular rate and rhythm, no murmurs Gastrointestinal system: Soft, nondistended, no rebound or guarding, plus bowel sounds Central nervous system: Alert and oriented.  Grossly intact, moving all extremities Extremities: Trace lower extremity edema Skin: No rashes over visible skin Psychiatry: Judgement and insight appear normal. Mood & affect appropriate.     Data Reviewed: I have personally reviewed following labs and imaging studies  CBC: Recent Labs  Lab 01/31/18 0140  WBC 7.2  HGB 12.6  HCT 40.7  MCV 105.4*  PLT 465   Basic Metabolic Panel: Recent Labs  Lab 01/31/18 0140  NA 139  K 4.3  CL 104  CO2 27  GLUCOSE 105*  BUN 14  CREATININE 0.92  CALCIUM 10.0  MG 2.3  PHOS 3.0  GFR: Estimated Creatinine Clearance: 63.9 mL/min (by C-G formula based on SCr of 0.92 mg/dL). Liver Function Tests: No results for input(s): AST, ALT, ALKPHOS, BILITOT, PROT, ALBUMIN in the last 168 hours. No results for input(s): LIPASE, AMYLASE in the last 168 hours. No results for input(s): AMMONIA in the last 168 hours. Coagulation Profile: No results for input(s): INR, PROTIME in the  last 168 hours. Cardiac Enzymes: No results for input(s): CKTOTAL, CKMB, CKMBINDEX, TROPONINI in the last 168 hours. BNP (last 3 results) No results for input(s): PROBNP in the last 8760 hours. HbA1C: No results for input(s): HGBA1C in the last 72 hours. CBG: No results for input(s): GLUCAP in the last 168 hours. Lipid Profile: No results for input(s): CHOL, HDL, LDLCALC, TRIG, CHOLHDL, LDLDIRECT in the last 72 hours. Thyroid Function Tests: No results for input(s): TSH, T4TOTAL, FREET4, T3FREE, THYROIDAB in the last 72 hours. Anemia Panel: Recent Labs    01/31/18 0140  VITAMINB12 95*  FOLATE 6.4   Sepsis Labs: No results for input(s): PROCALCITON, LATICACIDVEN in the last 168 hours.  Recent Results (from the past 240 hour(s))  MRSA PCR Screening     Status: Abnormal   Collection Time: 01/31/18  8:25 AM  Result Value Ref Range Status   MRSA by PCR POSITIVE (A) NEGATIVE Final    Comment:        The GeneXpert MRSA Assay (FDA approved for NASAL specimens only), is one component of a comprehensive MRSA colonization surveillance program. It is not intended to diagnose MRSA infection nor to guide or monitor treatment for MRSA infections. RESULT CALLED TO, READ BACK BY AND VERIFIED WITH: H.ROMINES RN AT 1139 ON 01/31/2018 BY S.VANHOORNE Performed at [facility], [place] [address], [place], [place] [address]          Radiology Studies: Dg Chest 2 View  Result Date: 01/31/2018 CLINICAL DATA:  Shortness of breath EXAM: CHEST - 2 VIEW COMPARISON:  12/31/2017 FINDINGS: Mild bronchitic changes. No focal consolidation or effusion. Normal heart size. No pneumothorax. Degenerative changes of the spine. IMPRESSION: No active cardiopulmonary disease.  Mild bronchitic changes Electronically Signed   By: Donavan Foil M.D.   On: 01/31/2018 02:41        Scheduled Meds: . atorvastatin  20 mg Oral QHS  . dexmethylphenidate  40 mg Oral QHS  . enoxaparin (LOVENOX)  injection  40 mg Subcutaneous Q24H  . famotidine  20 mg Oral QHS  . ipratropium-albuterol  3 mL Nebulization TID  . lamoTRIgine  200 mg Oral QHS  . mouth rinse  15 mL Mouth Rinse BID  . mupirocin ointment   Nasal BID  . pantoprazole  40 mg Oral QAC breakfast  . predniSONE  40 mg Oral Q breakfast  . QUEtiapine  50 mg Oral QHS  . risperiDONE  3 mg Oral QHS  . venlafaxine XR  150 mg Oral QHS   Continuous Infusions:   LOS: 1 day    Time spent: [place], MD [facility]  If 7PM-7AM, please contact night-coverage [email or web address] Password [facility] 02/01/2018, 9:49 AM

## 2018-02-02 LAB — BASIC METABOLIC PANEL
Anion gap: 6 (ref 5–15)
BUN: 14 mg/dL (ref 8–23)
CO2: 27 mmol/L (ref 22–32)
Chloride: 106 mmol/L (ref 98–111)
GFR calc non Af Amer: >60 mL/min (ref >60)
Glucose, Bld: 110 mg/dL — ABNORMAL HIGH (ref 70–99)
Potassium: 4.3 mmol/L (ref 3.5–5.1)
Sodium: 139 mmol/L (ref 135–145)

## 2018-02-02 LAB — CBC
HCT: 38 % (ref 36.0–46.0)
Hemoglobin: 11.8 g/dL — ABNORMAL LOW (ref 12.0–15.0)
MCH: 32.2 pg (ref 26.0–34.0)
MCHC: 31.1 g/dL (ref 30.0–36.0)
MCV: 103.8 fL — ABNORMAL HIGH (ref 80.0–100.0)
Platelets: 317 10*3/uL (ref 150–400)
RBC: 3.66 MIL/uL — ABNORMAL LOW (ref 3.87–5.11)
RDW: 13.1 % (ref 11.5–15.5)
WBC: 9.2 K/uL (ref 4.0–10.5)
nRBC: 0 % (ref 0.0–0.2)

## 2018-02-02 LAB — BASIC METABOLIC PANEL WITH GFR
Calcium: 10.3 mg/dL (ref 8.9–10.3)
Creatinine, Ser: 0.65 mg/dL (ref 0.44–1.00)
GFR calc Af Amer: >60 mL/min (ref >60)

## 2018-02-02 MED ORDER — CYANOCOBALAMIN 1000 MCG PO TABS: 1000.0000 ug | ORAL_TABLET | Freq: Every day | ORAL | 1 refills | Status: AC

## 2018-02-02 MED ORDER — PNEUMOCOCCAL VAC POLYVALENT 25 MCG/0.5ML IJ INJ: 0.5000 mL | INJECTION | INTRAMUSCULAR | Status: DC

## 2018-02-02 MED ORDER — PREDNISONE 20 MG PO TABS: 40.0000 mg | ORAL_TABLET | Freq: Every day | ORAL | 0 refills | Status: AC

## 2018-02-02 MED ORDER — PNEUMOCOCCAL VAC POLYVALENT 25 MCG/0.5ML IJ INJ
0.5000 mL | INJECTION | Freq: Once | INTRAMUSCULAR | Status: AC
  Administered 2018-02-02: 0.5 mL via INTRAMUSCULAR
  Filled 2018-02-02 (×2): qty 0.5

## 2018-02-02 MED ORDER — MOMETASONE FURO-FORMOTEROL FUM 200-5 MCG/ACT IN AERO: 2.0000 | INHALATION_SPRAY | Freq: Two times a day (BID) | RESPIRATORY_TRACT | 1 refills | Status: DC

## 2018-02-02 NOTE — Progress Notes (Signed)
I have reviewed and concur with this student's documentation.   

## 2018-02-02 NOTE — Progress Notes (Signed)
Oxygen removed in hall and o2 sensor placed on finger prior to ambulation. Ambulated in hall with sats at 92% on room air. MD notified. Will continue to monitor.

## 2018-02-02 NOTE — Discharge Instructions (Signed)
Chronic Bronchitis  Chronic bronchitis is long-lasting inflammation of the tubes that carry air into your lungs (bronchial tubes). This is inflammation that occurs:  · On most days of the week.  · For at least three months at a time.  · Over a period of two years in a row.  When the bronchial tubes are inflamed, they start to produce mucus. The inflammation and buildup of mucus make it more difficult to breathe. Chronic bronchitis is usually a permanent problem. It is one type of chronic obstructive pulmonary disease (COPD). People with chronic bronchitis are more likely to get frequent colds or respiratory infections.  What are the causes?  Chronic bronchitis most often occurs in people who:  · Have chronic, severe asthma.  · Have a history of smoking.  · Have asthma and smoke.  · Have certain lung diseases.  · Have had long-term exposure to certain irritating fumes or chemicals.  What are the signs or symptoms?  Symptoms of chronic bronchitis may include:  · A cough that brings up mucus (productive cough).  · Shortness of breath.  · Loud breathing (wheezing).  · Chest discomfort.  · Frequent (recurring) colds or respiratory infections.  Certain things can trigger chronic bronchitis symptoms or make them worse, such as:  · Infections.  · Stopping certain medicines.  · Smoking.  · Exposure to chemicals.  How is this diagnosed?  This condition may be diagnosed based on:  · Your symptoms and medical history.  · A physical exam.  · A chest X-ray.  · Lung (pulmonary) function tests.  How is this treated?  There is no cure for chronic bronchitis, but treatment can help control your symptoms. Treatment may include:  · Using a cool mist vaporizer or humidifier to make it easier to breathe.  · Drinking more fluids. Drinking more makes your mucus thinner, which may make it easier to breathe.  · Lifestyle changes, such as eating a healthier diet and getting more exercise.  · Medicines, such as:  ? Inhalers to improve air flow  in and out of your lungs.  ? Antibiotics to treat any bacterial infections you have, such as:  § Lung infection (pneumonia).  § Sinus infection.  § A sudden, severe (acute) episode of bronchitis.  · Oxygen therapy.  · Preventing infections by keeping up to date on vaccinations, including the pneumonia and flu vaccines.  · Pulmonary rehabilitation. This is a program that helps you manage your breathing problems and improve your quality of life. It may last for up to 4-12 weeks and may include exercise programs, education, counseling, and treatment support.  Follow these instructions at home:  Medicines  · Take over-the-counter and prescription medicines only as told by your health care provider.  · If you were prescribed an antibiotic medicine, take it as told by your health care provider. Do not stop taking the antibiotic even if you start to feel better.  Preventing infections  · Get vaccinations as told by your health care provider. Make sure you get a flu shot (influenza vaccine) every year.  · Wash your hands often with soap and water. If soap and water are not available, use hand sanitizer.  · Avoid contact with people who have symptoms of a cold or the flu.  Managing symptoms    · Do not smoke, and avoid secondhand smoke. Exposure to cigarette smoke or irritating chemicals will make bronchitis worse. If you smoke and you need help quitting, ask your health   care provider. Quitting smoking will help your lungs heal faster.  · Use an inhaler, cool mist vaporizer, or humidifier as told by your health care provider.  · Avoid pollen, dust, animal dander, molds, smoke, and other things that cause shortness of breath or wheezing attacks.  · Use oxygen therapy at home as directed. Follow instructions from your health care provider about how to use oxygen safely and take precautions to prevent fire. Make sure you never smoke while using oxygen or allow others to smoke in your home.  · Do not wait to get medical care if  you have any concerning symptoms or trouble breathing. Waiting could cause permanent injury and may be life threatening.  General instructions  · Talk with your health care provider about what activities are safe for you and about possible exercise routines. Regular exercise is very important to help you feel better.  · Drink enough fluids to keep your urine pale yellow.  · Keep all follow-up visits as told by your health care provider. This is important.  Contact a health care provider if:  · You have coughing or shortness of breath that gets worse.  · You have muscle aches.  · You have chest pain.  · Your mucus seems to get thicker.  · Your mucus changes from clear or white to yellow, green, gray, or bloody.  Get help right away if:  · Your usual medicines do not stop your wheezing.  · You have severe difficulty breathing.  These symptoms may represent a serious problem that is an emergency. Do not wait to see if the symptoms will go away. Get medical help right away. Call your local emergency services (911 in the [place] Do not drive yourself to the hospital.  Summary  · Chronic bronchitis is long-lasting inflammation of the tubes that carry air into your lungs (bronchial tubes).  · Chronic bronchitis is usually a permanent problem. It is one type of chronic obstructive pulmonary disease (COPD).  · There is no cure for chronic bronchitis, but treatment can help control your symptoms.  · Do not smoke, and avoid secondhand smoke. Exposure to cigarette smoke or irritating chemicals will make bronchitis worse.  This information is not intended to replace advice given to you by your health care provider. Make sure you discuss any questions you have with your health care provider.  Document Released: 10/13/2005 Document Revised: 11/15/2016 Document Reviewed: 11/15/2016  Elsevier Interactive Patient Education © 2019 [employer]

## 2018-02-02 NOTE — Plan of Care (Signed)
Patient sitting up in bed this morning. No complaints of pain. No shortness of breath noted; oxygen in place via nasal cannula at 2lpm [place]. Patient anxious to see if she can go home today. Will continue to monitor.

## 2018-02-02 NOTE — Discharge Summary (Signed)
Physician Discharge Summary  Julia Daniels TZG:[id number] DOB: 10-23-1950 DOA: 01/31/2018  PCP: [facility]  Admit date: 01/31/2018 Discharge date: 02/02/2018  Admitted From:Home Disposition:  Home  Recommendations for Outpatient Follow-up:  1. Follow up with PCP in 1-2 weeks 2. Please obtain BMP/CBC in one week 3. Please have your B12 checked in 1 month.  Home Health: No Equipment/Devices: none  Discharge Condition: stable CODE STATUS: FULL Diet recommendation: Heart Healthy  Brief/Interim Summary:  #) Hypoxic respiratory failure due to gold stage II COPD exacerbation: Patient was admitted with COPD exacerbation.  She was given scheduled short-acting bronchodilators as well as IV steroids and then transition to oral steroids.  Her shortness of breath and wheezing improved.  She was continued on her home L AMA/LABA/ICS.  She has quit smoking several months ago.  #) Hyperlipidemia: Patient was continued on home atorvastatin.  #) Macrocytosis: B12 was noted to be profoundly low.  She received 1 intramuscular injection of B12.  She was sent home on thousand micrograms daily of high-dose B12 with recommendations to recheck in approximately 1 month.  #) Bipolar 1 disorder: Patient was continued on home lamotrigine and risperidone.  #) Anxiety/depression/ADHD: Patient was continued on home PRN alprazolam, Focalin, quetiapine, venlafaxine.  Discharge Diagnoses:  Principal Problem:   COPD exacerbation ([place]) Active Problems:   ADD (attention deficit disorder)   Bipolar disorder, unspecified ([employer])   Hyperlipidemia   Anxiety   Acute on chronic respiratory failure (HCC)   Elevated MCV   Vitamin B12 deficiency   Acute on chronic respiratory failure with hypoxia [facility])    Discharge Instructions  Discharge Instructions    Call MD for:  difficulty breathing, headache or visual disturbances   Complete by:  As directed    Call MD for:  hives   Complete by:  As  directed    Call MD for:  persistant dizziness or light-headedness   Complete by:  As directed    Call MD for:  persistant nausea and vomiting   Complete by:  As directed    Call MD for:  redness, tenderness, or signs of infection (pain, swelling, redness, odor or green/yellow discharge around incision site)   Complete by:  As directed    Call MD for:  severe uncontrolled pain   Complete by:  As directed    Call MD for:  temperature >100.4   Complete by:  As directed    Diet - low sodium heart healthy   Complete by:  As directed    Discharge instructions   Complete by:  As directed    Please follow-up with your primary care doctor in 1 week.   Increase activity slowly   Complete by:  As directed      Allergies as of 02/02/2018      Reactions   Sulfa Antibiotics Other (See Comments)   Mouth gets raw      Medication List    STOP taking these medications   budesonide-formoterol 160-4.5 MCG/ACT inhaler Commonly known as:  SYMBICORT Replaced by:  mometasone-formoterol 200-5 MCG/ACT Aero     TAKE these medications   ALPRAZolam 0.25 MG tablet Commonly known as:  XANAX Take 0.25 mg by mouth daily as needed for anxiety.   atorvastatin 20 MG tablet Commonly known as:  LIPITOR Take 20 mg by mouth at bedtime.   cyanocobalamin 1000 MCG tablet Take 1 tablet (1,000 mcg total) by mouth daily for 30 days. Start taking on:  February 03, 2018  Dexmethylphenidate HCl 40 MG Cp24 Take 40 mg by mouth at bedtime.   famotidine 20 MG tablet Commonly known as:  PEPCID One at bedtime   ipratropium-albuterol 0.5-2.5 (3) MG/3ML Soln Commonly known as:  DUONEB Take 3 mLs by nebulization every 6 (six) hours as needed. What changed:  reasons to take this   lamoTRIgine 200 MG tablet Commonly known as:  LAMICTAL Take 200 mg by mouth at bedtime.   mometasone-formoterol 200-5 MCG/ACT Aero Commonly known as:  DULERA Inhale 2 puffs into the lungs 2 (two) times daily for 30 days. Replaces:   budesonide-formoterol 160-4.5 MCG/ACT inhaler   pantoprazole 40 MG tablet Commonly known as:  PROTONIX Take 1 tablet (40 mg total) by mouth daily. Take 30-60 min before first meal of the day   predniSONE 20 MG tablet Commonly known as:  DELTASONE Take 2 tablets (40 mg total) by mouth daily with breakfast for 4 days. Start taking on:  February 03, 2018 What changed:    medication strength  how much to take  how to take this  when to take this  additional instructions   PROAIR HFA 108 (90 Base) MCG/ACT inhaler Generic drug:  albuterol Inhale 2 puffs into the lungs every 6 (six) hours as needed for wheezing or shortness of breath.   QUEtiapine 25 MG tablet Commonly known as:  SEROQUEL Take 50 mg by mouth at bedtime.   risperiDONE 1 MG tablet Commonly known as:  RISPERDAL Take 3 mg by mouth at bedtime.   tiotropium 18 MCG inhalation capsule Commonly known as:  SPIRIVA HANDIHALER Place 1 capsule (18 mcg total) into inhaler and inhale daily.   venlafaxine XR 150 MG 24 hr capsule Commonly known as:  EFFEXOR-XR Take 150 mg by mouth at bedtime.   Vitamin D (Ergocalciferol) 1.25 MG (50000 UT) Caps capsule Commonly known as:  DRISDOL Take 50,000 Units by mouth once a week. On Saturdays       Allergies  Allergen Reactions  . Sulfa Antibiotics Other (See Comments)    Mouth gets raw    Consultations:  None   Procedures/Studies: Dg Chest 2 View  Result Date: 01/31/2018 CLINICAL DATA:  Shortness of breath EXAM: CHEST - 2 VIEW COMPARISON:  12/31/2017 FINDINGS: Mild bronchitic changes. No focal consolidation or effusion. Normal heart size. No pneumothorax. Degenerative changes of the spine. IMPRESSION: No active cardiopulmonary disease.  Mild bronchitic changes Electronically Signed   By: Donavan Foil M.D.   On: 01/31/2018 02:41      Subjective:   Discharge Exam: Vitals:   02/02/18 0901 02/02/18 0946  BP: 122/86   Pulse: 97   Resp:    Temp: 98.7 F (37.1 C)    SpO2: 99% 92%   Vitals:   02/02/18 0839 02/02/18 0841 02/02/18 0901 02/02/18 0946  BP:   122/86   Pulse:   97   Resp:      Temp:   98.7 F (37.1 C)   TempSrc:   Oral   SpO2: 95% 95% 99% 92%  Weight:      Height:       General exam: nodistress, can only complete 3-4 word sentences Respiratory system: no  increased work of breathing, intermittent rhonchi, no wheezing,, no crackles Cardiovascular system: Regular rate and rhythm, no murmurs Gastrointestinal system: Soft, nondistended, no rebound or guarding, plus bowel sounds Central nervous system: Alert and oriented.  Grossly intact, moving all extremities Extremities: Trace lower extremity edema Skin: No rashes over visible skin Psychiatry: Judgement and  insight appear normal. Mood & affect appropriate   The results of significant diagnostics from this hospitalization (including imaging, microbiology, ancillary and laboratory) are listed below for reference.     Microbiology: Recent Results (from the past 240 hour(s))  MRSA PCR Screening     Status: Abnormal   Collection Time: 01/31/18  8:25 AM  Result Value Ref Range Status   MRSA by PCR POSITIVE (A) NEGATIVE Final    Comment:        The GeneXpert MRSA Assay (FDA approved for NASAL specimens only), is one component of a comprehensive MRSA colonization surveillance program. It is not intended to diagnose MRSA infection nor to guide or monitor treatment for MRSA infections. RESULT CALLED TO, READ BACK BY AND VERIFIED WITH: H.ROMINES RN AT 1139 ON 01/31/2018 BY S.VANHOORNE Performed at [facility], [place] [address], [place], [place] [address]      Labs: BNP (last 3 results) Recent Labs    08/23/17 1206  BNP 87.6   Basic Metabolic Panel: Recent Labs  Lab 01/31/18 0140 02/02/18 0408  NA 139 139  K 4.3 4.3  CL 104 106  CO2 27 27  GLUCOSE 105* 110*  BUN 14 14  CREATININE 0.92 0.65  CALCIUM 10.0 10.3  MG 2.3  --   PHOS 3.0  --     Liver Function Tests: No results for input(s): AST, ALT, ALKPHOS, BILITOT, PROT, ALBUMIN in the last 168 hours. No results for input(s): LIPASE, AMYLASE in the last 168 hours. No results for input(s): AMMONIA in the last 168 hours. CBC: Recent Labs  Lab 01/31/18 0140 02/02/18 0408  WBC 7.2 9.2  HGB 12.6 11.8*  HCT 40.7 38.0  MCV 105.4* 103.8*  PLT 301 317   Cardiac Enzymes: No results for input(s): CKTOTAL, CKMB, CKMBINDEX, TROPONINI in the last 168 hours. BNP: Invalid input(s): POCBNP CBG: No results for input(s): GLUCAP in the last 168 hours. D-Dimer No results for input(s): DDIMER in the last 72 hours. Hgb A1c No results for input(s): HGBA1C in the last 72 hours. Lipid Profile No results for input(s): CHOL, HDL, LDLCALC, TRIG, CHOLHDL, LDLDIRECT in the last 72 hours. Thyroid function studies No results for input(s): TSH, T4TOTAL, T3FREE, THYROIDAB in the last 72 hours.  Invalid input(s): FREET3 Anemia work up Recent Labs    01/31/18 0140  VITAMINB12 95*  FOLATE 6.4   Urinalysis    Component Value Date/Time   BILIRUBINUR n 09/23/2012 1111   PROTEINUR n 09/23/2012 1111   UROBILINOGEN 0.2 09/23/2012 1111   NITRITE n 09/23/2012 1111   LEUKOCYTESUR Negative 09/23/2012 1111   Sepsis Labs Invalid input(s): PROCALCITONIN,  WBC,  LACTICIDVEN Microbiology Recent Results (from the past 240 hour(s))  MRSA PCR Screening     Status: Abnormal   Collection Time: 01/31/18  8:25 AM  Result Value Ref Range Status   MRSA by PCR POSITIVE (A) NEGATIVE Final    Comment:        The GeneXpert MRSA Assay (FDA approved for NASAL specimens only), is one component of a comprehensive MRSA colonization surveillance program. It is not intended to diagnose MRSA infection nor to guide or monitor treatment for MRSA infections. RESULT CALLED TO, READ BACK BY AND VERIFIED WITH: H.ROMINES RN AT 1139 ON 01/31/2018 BY S.VANHOORNE Performed at [facility], [place]  [address] [place], [place] [address]      Time coordinating discharge: 11  SIGNED:   Cristy Folks, MD  [facility] 02/02/2018, 11:22 AM  If 7PM-7AM, please contact night-coverage [email or web address] Password [email or web address]

## 2018-02-11 ENCOUNTER — Ambulatory Visit: Payer: BLUE CROSS/BLUE SHIELD | Admitting: Internal Medicine

## 2018-02-11 ENCOUNTER — Encounter: Payer: Self-pay | Admitting: Internal Medicine

## 2018-02-11 VITALS — BP 122/74 | HR 88 | Ht 63.0 in | Wt 197.6 lb

## 2018-02-11 DIAGNOSIS — J449 Chronic obstructive pulmonary disease, unspecified: Secondary | ICD-10-CM | POA: Diagnosis not present

## 2018-02-11 MED ORDER — BUDESONIDE-FORMOTEROL FUMARATE 160-4.5 MCG/ACT IN AERO: INHALATION_SPRAY | RESPIRATORY_TRACT | Status: DC

## 2018-02-11 MED ORDER — TIOTROPIUM BROMIDE MONOHYDRATE 2.5 MCG/ACT IN AERS: INHALATION_SPRAY | RESPIRATORY_TRACT | 11 refills | Status: DC

## 2018-02-11 MED ORDER — TIOTROPIUM BROMIDE MONOHYDRATE 2.5 MCG/ACT IN AERS: 2.0000 | INHALATION_SPRAY | Freq: Every day | RESPIRATORY_TRACT | 0 refills | Status: DC

## 2018-02-11 NOTE — Patient Instructions (Addendum)
Plan A = Automatic = symbicort 160 Take 2 puffs and spiriva 2puffs  first thing when you wake up  and then another 2 puffs of symbicort about 12 hours later. Also add Pantoprazole (protonix) 40 mg   Take  30-60 min before first meal of the day and Pepcid (famotidine)  20 mg one @  bedtime until return to office - this is the best way to tell whether stomach acid is contributing to your problem.     Work on inhaler technique:  relax and gently blow all the way out then take a nice smooth deep breath back in, triggering the inhaler at same time you start breathing in.  Hold for up to 5 seconds if you can. Blow out thru nose. Rinse and gargle with water when done     Plan B = Backup Only use your albuterol as a rescue medication to be used if you can't catch your breath by walking slower, resting or doing a relaxed purse lip breathing pattern.  - The less you use it, the better it will work when you need it. - Ok to use the inhaler up to 2 puffs  every 4 hours if you must but call for appointment if use goes up over your usual need - Don't leave home without it !!  (think of it like the spare tire for your car)   Plan C = Crisis - only use your albuterol nebulizer if you first try Plan B and it fails to help > ok to use the nebulizer up to every 4 hours but if start needing it regularly call for immediate appointment   Plan D = Deltasone If immediate relief worse A thru C >  Try Deltasone = Prednisone 10 mg take  4 each am x 2 days,   2 each am x 2 days,  1 each am x 2 days and stop      GERD (REFLUX)  is an extremely common cause of respiratory symptoms just like yours , many times with no obvious heartburn at all.    It can be treated with medication, but also with lifestyle changes including elevation of the head of your bed (ideally with 6 -8inch blocks under the headboard of your bed),  Smoking cessation, avoidance of late meals, excessive alcohol, and avoid fatty foods, chocolate, peppermint,  colas, red wine, and acidic juices such as orange juice.  NO MINT OR MENTHOL PRODUCTS SO NO COUGH DROPS  USE SUGARLESS CANDY INSTEAD (Jolley ranchers or Stover's or Life Savers) or even ice chips will also do - the key is to swallow to prevent all throat clearing. NO OIL BASED VITAMINS - use powdered substitutes.  Avoid fish oil when coughing.   Keep your previous appts - call sooner if needed   late add: consider allergy testing/ add singulair next ?

## 2018-02-11 NOTE — Progress Notes (Signed)
Julia Daniels, female    DOB: 09-Jan-1951    MRN: [id number]     Brief patient profile:  49 yowf  MS phenotype for alpha one who  quit smoking 08/23/17  With indolent  onset around 2014 doe gradually worse and started [facility] by Julia Daniels 2017 then admit [facility] late summer 2019 with dx of aecopd  And since then highly variable sob So self- referred to pulmonary clinic 10/31/2017 with GOLD III criteria for copd on initial eval.    History of Present Illness  10/31/2017  Pulmonary/ 1st office eval/ Julia Daniels Chief Complaint  Patient presents with  . Pulmonary Consult    Self referral for COPD. Pt was dxed with COPD 2 75yrs ago. She has been seen in the past by Dr Verdie Mosher. She states she gets tired easily. She gets winded with carrying something heavy and some days with minimal walking. She uses proair once daily on average and neb with albuterol 3-4 x per wk.   Dyspnea:  Best days doe = MMRC3 = can't walk 100 yards even at a slow pace at a flat grade s stopping due to sob  / better p a few hours but highly variable s pattern  Cough: better since stopped smoking Sleep: bed is flat/ 2 pillows / no am flare  SABA use: neb helps the most  rec Plan A = Automatic = symbicort 160 Take 2 puffs first thing in am and then another 2 puffs about 12 hours later.  Work on inhaler technique:  Plan B = Backup Only use your albuterol as a rescue medication Plan C = Crisis - only use your albuterol nebulizer if you first try Plan B and it fails to help  Plan D = Deltasone  If worse despite A thru C >  Try Deltasone = Prednisone 10 mg take  4 each am x 2 days,   2 each am x 2 days,  1 each am x 2 days and stop       11/08/2017  f/u ov/Julia Daniels re:  GOLD III / no better on symb 160 2bid  Chief Complaint  Patient presents with  . Follow-up    c/o sob with exertion, using rescue inhaler daily.   Dyspnea:  Not really much better, never used prednisone as rec  Cough: no Sleeping: bed flat 2 pillows  SABA use:  about twice daily hfa and neb  02: none  rec Go ahead and take the prednisone you have on hand> a lot better and maintained on symb 160 2bid   Work on inhaler technique:  Only use your albuterol as a rescue medication     12/04/2017  f/u ov/Julia Daniels copd III  maint symb 160 2bid  Chief Complaint  Patient presents with  . Follow-up    Breathing is much improved since the last visit. She has not used her albuterol inhaler or neb.    Dyspnea:  MMRC2 = can't walk a nl pace on a flat grade s sob but does fine slow and flat  Cough: better Sleeping: ok bed flat 2 pillows  SABA use: min rec Work on inhaler technique:   Please schedule a follow up visit in 3 months but call sooner if needed  PFT's on return      Admit date: 12/31/2017 Discharge date: 01/02/2018   01/18/2018  f/u ov/Julia Daniels re:  Copd GOLD III  symbicort / spiriva handihaler and one day of prednisone Chief Complaint  Patient presents with  .  Follow-up    Breathing is okay today. She has had another hospital stay since last visit for increased SOB. She has not use her rescue inhaler or neb since last hospitalization.   Dyspnea:  MMRC2 = can't walk a nl pace on a flat grade s sob but does fine slow and flat  Cough: none Sleeping:  Bed flat/ one pillow  SABA use: none now   02: none  Last flare occurred at work per H&P notes and she did not respond initially to saba  rec Plan A = Automatic = symbicort 160 Take 2 puffs and spiriva  first thing when you wake up  and then another 2 puffs of symbicort about 12 hours later. Also add Pantoprazole (protonix) 40 mg   Take  30-60 min before first meal of the day and Pepcid (famotidine)  20 mg one @  bedtime until return to office - this is the best way to tell whether stomach acid is contributing to your problem.   Work on inhaler technique:  Plan B = Backup Only use your albuterol as a rescue medication Plan C = Crisis - only use your albuterol nebulizer if you first try Plan B and  it fails to help > ok to use the nebulizer up to every 4 hours but if start needing it regularly call for immediate appointment Plan D = Deltasone  If worse despite A thru C >  Try Deltasone = Prednisone 10 mg take  4 each am x 2 days,   2 each am x 2 days,  1 each am x 2 days and stop    GERD (REFLUX)  is an extremely common cause of respiratory symptoms just like yours , many times with no obvious heartburn at all.  Please keep appt for pfts  with all medications /inhalers/ solutions in hand so we can verify exactly what you are taking. This includes all medications from all doctors and over the counters   02/11/2018  f/u ov/Julia Daniels re:  S/p aecopd Finished pred on Jan 30 th x 40 mg  Chief Complaint  Patient presents with  . Shortness of Breath  Dyspnea: Jan 23 slept fine that "night"  p usual dose of symb/ spiriva felt congested / chest tight / then 2 puffs of saba hfa/ then neb then [facility] p onset > 3 day admit Cough: never Sleeping: on wedge  SABA use: none now  02: none   No obvious day to day or daytime variability or assoc excess/ purulent sputum or mucus plugs or hemoptysis or cp or chest tightness, subjective wheeze or overt sinus or hb symptoms.   Sleeping as above  without nocturnal  or early am exacerbation  of respiratory  c/o's or need for noct saba. Also denies any obvious fluctuation of symptoms with weather or environmental changes or other aggravating or alleviating factors except as outlined above   No unusual exposure hx or h/o childhood pna/ asthma or knowledge of premature birth.  Current Allergies, Complete Past Medical History, Past Surgical History, Family History, and Social History were reviewed in [employer] record.  ROS  The following are not active complaints unless bolded Hoarseness, sore throat, dysphagia, dental problems, itching, sneezing,  nasal congestion or discharge of excess mucus or purulent secretions, ear ache,   fever,  chills, sweats, unintended wt loss or wt gain, classically pleuritic or exertional cp,  orthopnea pnd or arm/hand swelling  or leg swelling, presyncope, palpitations, abdominal  pain, anorexia, nausea, vomiting, diarrhea  or change in bowel habits or change in bladder habits, change in stools or change in urine, dysuria, hematuria,  rash, arthralgias, visual complaints, headache, numbness, weakness or ataxia or problems with walking or coordination,  change in mood or  memory.        Current Meds  Medication Sig  . albuterol (PROAIR HFA) 108 (90 Base) MCG/ACT inhaler Inhale 2 puffs into the lungs every 6 (six) hours as needed for wheezing or shortness of breath.  . ALPRAZolam (XANAX) 0.25 MG tablet Take 0.25 mg by mouth daily as needed for anxiety.   Marland Kitchen atorvastatin (LIPITOR) 20 MG tablet Take 20 mg by mouth at bedtime.  Marland Kitchen Dexmethylphenidate HCl 40 MG CP24 Take 40 mg by mouth at bedtime.  . famotidine (PEPCID) 20 MG tablet One at bedtime  . ipratropium-albuterol (DUONEB) 0.5-2.5 (3) MG/3ML SOLN Take 3 mLs by nebulization every 6 (six) hours as needed. (Patient taking differently: Take 3 mLs by nebulization every 6 (six) hours as needed (sob). )  . lamoTRIgine (LAMICTAL) 200 MG tablet Take 200 mg by mouth at bedtime.   . mometasone-formoterol (DULERA) 200-5 MCG/ACT AERO Inhale 2 puffs into the lungs 2 (two) times daily for 30 days.  . pantoprazole (PROTONIX) 40 MG tablet Take 1 tablet (40 mg total) by mouth daily. Take 30-60 min before first meal of the day  . QUEtiapine (SEROQUEL) 25 MG tablet Take 50 mg by mouth at bedtime.  . risperiDONE (RISPERDAL) 1 MG tablet Take 3 mg by mouth at bedtime.   Marland Kitchen tiotropium (SPIRIVA HANDIHALER) 18 MCG inhalation capsule Place 1 capsule (18 mcg total) into inhaler and inhale daily.  Marland Kitchen venlafaxine XR (EFFEXOR-XR) 150 MG 24 hr capsule Take 150 mg by mouth at bedtime.   . vitamin B-12 1000 MCG tablet Take 1 tablet (1,000 mcg total) by mouth daily for 30 days.  .  Vitamin D, Ergocalciferol, (DRISDOL) 50000 units CAPS capsule Take 50,000 Units by mouth once a week. On Saturdays                              Objective:     Obese anxious wf nad   02/11/2018          197   01/18/2018        199   12/04/2017     191   11/08/17 189 lb (85.7 kg)  10/31/17 187 lb (84.8 kg)  09/18/17 179 lb 0.2 oz (81.2 kg)    Vital signs reviewed - Note on arrival 02 sats  97% on Daniels      HEENT: full dentures/ nl  turbinates bilaterally, and oropharynx. Nl external ear canals without cough reflex   NECK :  without JVD/Nodes/TM/ nl carotid upstrokes bilaterally   LUNGS: no acc muscle use,  Nl contour chest which is clear to A and P bilaterally without cough on insp or exp maneuvers   CV:  RRR  no s3 or murmur or increase in P2, and no edema   ABD:  Obese/ soft and nontender with nl inspiratory excursion in the supine position. No bruits or organomegaly appreciated, bowel sounds nl  MS:  Nl gait/ ext warm without deformities, calf tenderness, cyanosis or clubbing No obvious joint restrictions   SKIN: warm and dry without lesions    NEURO:  alert, approp, nl sensorium with  no motor or cerebellar deficits apparent.  I personally reviewed images and agree with radiology impression as follows:  CXR:   01/31/18 No active cardiopulmonary disease.  Mild bronchitic changes        Assessment

## 2018-02-14 ENCOUNTER — Encounter: Payer: Self-pay | Admitting: Internal Medicine

## 2018-02-14 NOTE — Assessment & Plan Note (Addendum)
Quit smoking 08/2017  Spirometry 10/31/2017  FEV1 0.7 (30%)  Ratio 51 p Breo in am   - 10/31/2017  Eos = 0.9  - alpha one AT screen 10/31/2017   MS level 121  - 10/31/2017   Walked RA x one lap @ 185 stopped due to sob s desats at fast pace   - 01/18/2018   Walked RA  2 laps @ 259ft each @ fast  pace  stopped due to sob no desat  - Spirometry 01/18/2018  FEV1 1.1 (49%)  Ratio 63 with mild curvature after am spiriva and symbicort   - 02/11/2018  After extensive coaching inhaler device,  effectiveness =    75% short Ti and added prednisone as plan D    DDX of  difficult airways management almost all start with A and  include Adherence, Ace Inhibitors, Acid Reflux, Active Sinus Disease, Alpha 1 Antitripsin deficiency, Anxiety masquerading as Airways dz,  ABPA,  Allergy(esp in young), Aspiration (esp in elderly), Adverse effects of meds,  Active smoking or vaping, A bunch of PE's (a small clot burden can't cause this syndrome unless there is already severe underlying pulm or vascular dz with poor reserve) plus two Bs  = Bronchiectasis and Beta blocker use..and one C= CHF   Adherence is always the initial "prime suspect" and is a multilayered concern that requires a "trust but verify" approach in every patient - starting with knowing how to use medications, especially inhalers, correctly, keeping up with refills and understanding the fundamental difference between maintenance and prns vs those medications only taken for a very short course and then stopped and not refilled.  - see hfa teaching - return with all meds in hand using a trust but verify approach to confirm accurate Medication  Reconciliation The principal here is that until we are certain that the  patients are doing what we've asked, it makes no sense to ask them to do more.    ? Acid (or non-acid) GERD > always difficult to exclude as up to 75% of pts in some series report no assoc GI/ Heartburn symptoms> rec max (24h)  acid suppression and diet  restrictions/ reviewed and instructions given in writing.   ? Adverse effects of meds > ? Dpi problematic in this setting> change to spiriva smi   ? Allergy/ asthma > prednisone approp as backup/ consider allergy testing/ add singulair next ?   ? A bunch of PE's > CTa neg for pe 08/23/17  ? Anxiety > usually at the bottom of this list of usual suspects but should be much higher on this pt's based on H and P and note already on psychotropics and may interfere with adherence and also interpretation of response or lack thereof to symptom management which can be quite subjective.   ? Active smoking >  Denies, consider nicotine levels at next admit to confirm.  ? chf  / cardiac asthma - bnp levels < 100 at flare 08/23/17    I had an extended discussion with the patient reviewing all relevant studies completed to date and  lasting 15 to 20 minutes of a 25 minute visit    See device teaching which extended face to face time for this visit.  Each maintenance medication was reviewed in detail including emphasizing most importantly the difference between maintenance and prns and under what circumstances the prns are to be triggered using an action plan format that is not reflected in the computer generated alphabetically organized AVS which  I have not found useful in most complex patients, especially with respiratory illnesses  Please see AVS for specific instructions unique to this visit that I personally wrote and verbalized to the the pt in detail and then reviewed with pt  by my nurse highlighting any  changes in therapy recommended at today's visit to their plan of care.

## 2018-03-06 ENCOUNTER — Ambulatory Visit (INDEPENDENT_AMBULATORY_CARE_PROVIDER_SITE_OTHER): Payer: BLUE CROSS/BLUE SHIELD | Admitting: Internal Medicine

## 2018-03-06 DIAGNOSIS — J449 Chronic obstructive pulmonary disease, unspecified: Secondary | ICD-10-CM | POA: Diagnosis not present

## 2018-03-06 LAB — PULMONARY FUNCTION TEST
DL/VA % pred: 97 %
DL/VA: 4.1 ml/min/mmHg/L
DLCO cor % pred: 63 %
DLCO cor: 12.09 ml/min/mmHg
DLCO unc % pred: 59 %
DLCO unc: 11.45 ml/min/mmHg
FEF 25-75 Post: 0.75 L/sec
FEF 25-75 Pre: 0.38 L/sec
FEF2575-%Change-Post: 95 %
FEF2575-%Pred-Post: 36 %
FEF2575-%Pred-Pre: 18 %
FEV1-%Change-Post: 27 %
FEV1-%Pred-Post: 43 %
FEV1-%Pred-Pre: 34 %
FEV1-Post: 1.02 L
FEV1-Pre: 0.8 L
FEV1FVC-%Change-Post: 7 %
FEV1FVC-%Pred-Pre: 75 %
FEV6-%Change-Post: 17 %
FEV6-%Pred-Post: 55 %
FEV6-%Pred-Pre: 47 %
FEV6-POST: 1.61 L
FEV6-PRE: 1.37 L
FEV6FVC-%Change-Post: -1 %
FEV6FVC-%PRED-PRE: 104 %
FEV6FVC-%Pred-Post: 102 %
FVC-%Change-Post: 18 %
FVC-%Pred-Post: 53 %
FVC-%Pred-Pre: 45 %
FVC-Post: 1.63 L
FVC-Pre: 1.37 L
POST FEV6/FVC RATIO: 99 %
Post FEV1/FVC ratio: 62 %
Pre FEV1/FVC ratio: 58 %
Pre FEV6/FVC Ratio: 100 %
RV % pred: 139 %
RV: 2.84 L
TLC % PRED: 90 %
TLC: 4.42 L

## 2018-03-06 NOTE — Progress Notes (Signed)
Patient completed full PFT today. Patient had hard time blowing out breath and taking deep enough breath back in to meet standard for DLCO. Only able to complete one DLCO that passed.

## 2018-03-07 ENCOUNTER — Encounter (HOSPITAL_COMMUNITY): Payer: Self-pay | Admitting: Emergency Medicine

## 2018-03-07 ENCOUNTER — Ambulatory Visit: Payer: Self-pay | Admitting: Internal Medicine

## 2018-03-07 ENCOUNTER — Emergency Department (HOSPITAL_COMMUNITY): Payer: BLUE CROSS/BLUE SHIELD

## 2018-03-07 ENCOUNTER — Inpatient Hospital Stay (HOSPITAL_COMMUNITY)
Admission: EM | Admit: 2018-03-07 | Discharge: 2018-03-09 | DRG: 190 | Disposition: A | Discharge status: Home / Self Care | Payer: BLUE CROSS/BLUE SHIELD | Attending: Internal Medicine | Admitting: Internal Medicine

## 2018-03-07 ENCOUNTER — Other Ambulatory Visit: Payer: Self-pay

## 2018-03-07 DIAGNOSIS — Z801 Family history of malignant neoplasm of trachea, bronchus and lung: Secondary | ICD-10-CM

## 2018-03-07 DIAGNOSIS — E785 Hyperlipidemia, unspecified: Secondary | ICD-10-CM | POA: Diagnosis present

## 2018-03-07 DIAGNOSIS — J441 Chronic obstructive pulmonary disease with (acute) exacerbation: Principal | ICD-10-CM | POA: Diagnosis present

## 2018-03-07 DIAGNOSIS — F988 Other specified behavioral and emotional disorders with onset usually occurring in childhood and adolescence: Secondary | ICD-10-CM | POA: Diagnosis present

## 2018-03-07 DIAGNOSIS — Z7951 Long term (current) use of inhaled steroids: Secondary | ICD-10-CM

## 2018-03-07 DIAGNOSIS — Z720 Tobacco use: Secondary | ICD-10-CM | POA: Diagnosis present

## 2018-03-07 DIAGNOSIS — F319 Bipolar disorder, unspecified: Secondary | ICD-10-CM | POA: Diagnosis present

## 2018-03-07 DIAGNOSIS — Z825 Family history of asthma and other chronic lower respiratory diseases: Secondary | ICD-10-CM

## 2018-03-07 DIAGNOSIS — K219 Gastro-esophageal reflux disease without esophagitis: Secondary | ICD-10-CM | POA: Diagnosis present

## 2018-03-07 DIAGNOSIS — Z87891 Personal history of nicotine dependence: Secondary | ICD-10-CM

## 2018-03-07 DIAGNOSIS — J9601 Acute respiratory failure with hypoxia: Secondary | ICD-10-CM | POA: Diagnosis present

## 2018-03-07 DIAGNOSIS — F419 Anxiety disorder, unspecified: Secondary | ICD-10-CM | POA: Diagnosis present

## 2018-03-07 LAB — CBC
HEMATOCRIT: 41.9 % (ref 36.0–46.0)
Hemoglobin: 13.2 g/dL (ref 12.0–15.0)
MCH: 32.4 pg (ref 26.0–34.0)
MCHC: 31.5 g/dL (ref 30.0–36.0)
MCV: 102.7 fL — ABNORMAL HIGH (ref 80.0–100.0)
Platelets: 258 10*3/uL (ref 150–400)
RBC: 4.08 MIL/uL (ref 3.87–5.11)
RDW: 12.8 % (ref 11.5–15.5)
WBC: 7.5 10*3/uL (ref 4.0–10.5)
nRBC: 0 % (ref 0.0–0.2)

## 2018-03-07 LAB — BASIC METABOLIC PANEL
Anion gap: 9 (ref 5–15)
BUN: 12 mg/dL (ref 8–23)
CO2: 26 mmol/L (ref 22–32)
Calcium: 9.8 mg/dL (ref 8.9–10.3)
Chloride: 107 mmol/L (ref 98–111)
Creatinine, Ser: 0.96 mg/dL (ref 0.44–1.00)
GFR calc non Af Amer: >60 mL/min (ref >60)
Glucose, Bld: 116 mg/dL — ABNORMAL HIGH (ref 70–99)
Potassium: 4.2 mmol/L (ref 3.5–5.1)
Sodium: 142 mmol/L (ref 135–145)

## 2018-03-07 LAB — BLOOD GAS, VENOUS
Acid-Base Excess: 1.1 mmol/L (ref 0.0–2.0)
Bicarbonate: 27.5 mmol/L (ref 20.0–28.0)
O2 Saturation: 53.8 %
Patient temperature: 98.6
pCO2, Ven: 53.9 mmHg (ref 44.0–60.0)
pH, Ven: 7.329 (ref 7.250–7.430)

## 2018-03-07 LAB — I-STAT TROPONIN, ED: Troponin i, poc: 0 ng/mL (ref 0.00–0.08)

## 2018-03-07 MED ORDER — IPRATROPIUM BROMIDE 0.02 % IN SOLN
0.5000 mg | Freq: Once | RESPIRATORY_TRACT | Status: AC
  Administered 2018-03-07: 0.5 mg via RESPIRATORY_TRACT
  Filled 2018-03-07: qty 2.5

## 2018-03-07 MED ORDER — PANTOPRAZOLE SODIUM 40 MG PO TBEC
40.0000 mg | DELAYED_RELEASE_TABLET | Freq: Every day | ORAL | Status: DC
  Administered 2018-03-07 – 2018-03-09 (×3): 40 mg via ORAL
  Filled 2018-03-07 (×3): qty 1

## 2018-03-07 MED ORDER — ENOXAPARIN SODIUM 40 MG/0.4ML ~~LOC~~ SOLN
40.0000 mg | SUBCUTANEOUS | Status: DC
  Administered 2018-03-07 – 2018-03-08 (×2): 40 mg via SUBCUTANEOUS
  Filled 2018-03-07 (×2): qty 0.4

## 2018-03-07 MED ORDER — QUETIAPINE FUMARATE 50 MG PO TABS
50.0000 mg | ORAL_TABLET | Freq: Every day | ORAL | Status: DC
  Administered 2018-03-07 – 2018-03-08 (×2): 50 mg via ORAL
  Filled 2018-03-07 (×2): qty 1

## 2018-03-07 MED ORDER — LAMOTRIGINE 100 MG PO TABS
200.0000 mg | ORAL_TABLET | Freq: Every day | ORAL | Status: DC
  Administered 2018-03-07 – 2018-03-08 (×2): 200 mg via ORAL
  Filled 2018-03-07 (×2): qty 2

## 2018-03-07 MED ORDER — FAMOTIDINE 20 MG PO TABS
20.0000 mg | ORAL_TABLET | Freq: Every day | ORAL | Status: DC
  Administered 2018-03-07 – 2018-03-08 (×2): 20 mg via ORAL
  Filled 2018-03-07 (×2): qty 1

## 2018-03-07 MED ORDER — VENLAFAXINE HCL ER 75 MG PO CP24
150.0000 mg | ORAL_CAPSULE | Freq: Every day | ORAL | Status: DC
  Administered 2018-03-07 – 2018-03-08 (×2): 150 mg via ORAL
  Filled 2018-03-07 (×2): qty 2

## 2018-03-07 MED ORDER — IPRATROPIUM-ALBUTEROL 0.5-2.5 (3) MG/3ML IN SOLN
3.0000 mL | RESPIRATORY_TRACT | Status: DC
  Administered 2018-03-07 – 2018-03-09 (×11): 3 mL via RESPIRATORY_TRACT
  Filled 2018-03-07 (×11): qty 3

## 2018-03-07 MED ORDER — DEXMETHYLPHENIDATE HCL ER 20 MG PO CP24
40.0000 mg | ORAL_CAPSULE | Freq: Every day | ORAL | Status: DC
  Administered 2018-03-08 – 2018-03-09 (×2): 40 mg via ORAL
  Filled 2018-03-07 (×2): qty 2

## 2018-03-07 MED ORDER — METHYLPREDNISOLONE SODIUM SUCC 40 MG IJ SOLR
40.0000 mg | Freq: Two times a day (BID) | INTRAMUSCULAR | Status: DC
  Administered 2018-03-07: 40 mg via INTRAVENOUS
  Filled 2018-03-07: qty 1

## 2018-03-07 MED ORDER — AZITHROMYCIN 250 MG PO TABS: 500.0000 mg | ORAL_TABLET | Freq: Every day | ORAL | Status: DC

## 2018-03-07 MED ORDER — ALBUTEROL SULFATE (2.5 MG/3ML) 0.083% IN NEBU: 5.0000 mg | INHALATION_SOLUTION | Freq: Once | RESPIRATORY_TRACT | Status: DC

## 2018-03-07 MED ORDER — ALBUTEROL (5 MG/ML) CONTINUOUS INHALATION SOLN
15.0000 mg/h | INHALATION_SOLUTION | Freq: Once | RESPIRATORY_TRACT | Status: AC
  Administered 2018-03-07: 15 mg/h via RESPIRATORY_TRACT
  Filled 2018-03-07: qty 20

## 2018-03-07 MED ORDER — PREDNISONE 20 MG PO TABS: 40.0000 mg | ORAL_TABLET | Freq: Every day | ORAL | Status: DC

## 2018-03-07 MED ORDER — SODIUM CHLORIDE 0.9% FLUSH
3.0000 mL | Freq: Two times a day (BID) | INTRAVENOUS | Status: DC
  Administered 2018-03-07 – 2018-03-08 (×3): 3 mL via INTRAVENOUS

## 2018-03-07 MED ORDER — ALBUTEROL SULFATE (2.5 MG/3ML) 0.083% IN NEBU
5.0000 mg | INHALATION_SOLUTION | Freq: Once | RESPIRATORY_TRACT | Status: AC
  Administered 2018-03-07: 5 mg via RESPIRATORY_TRACT
  Filled 2018-03-07: qty 6

## 2018-03-07 MED ORDER — ALPRAZOLAM 0.25 MG PO TABS
0.2500 mg | ORAL_TABLET | Freq: Every day | ORAL | Status: DC | PRN
  Administered 2018-03-07: 0.25 mg via ORAL
  Filled 2018-03-07: qty 1

## 2018-03-07 MED ORDER — ATORVASTATIN CALCIUM 20 MG PO TABS
20.0000 mg | ORAL_TABLET | Freq: Every day | ORAL | Status: DC
  Administered 2018-03-07 – 2018-03-08 (×2): 20 mg via ORAL
  Filled 2018-03-07 (×2): qty 1

## 2018-03-07 MED ORDER — SODIUM CHLORIDE 0.9 % IV SOLN: 500.0000 mg | INTRAVENOUS | Status: DC

## 2018-03-07 MED ORDER — RISPERIDONE 2 MG PO TABS
3.0000 mg | ORAL_TABLET | Freq: Every day | ORAL | Status: DC
  Administered 2018-03-07 – 2018-03-08 (×2): 3 mg via ORAL
  Filled 2018-03-07 (×2): qty 1

## 2018-03-07 NOTE — ED Notes (Signed)
Patient transported to X-ray 

## 2018-03-07 NOTE — ED Provider Notes (Signed)
[facility] DEPT Provider Note   CSN: [id number] Arrival date & time: 03/07/18  0554  History   Chief Complaint Chief Complaint  Patient presents with  . Shortness of Breath    HPI Julia Daniels is a 68 y.o. female with past medical history significant for COPD, asthma, anxiety, depression who presents for evaluation of shortness of breath.  Patient states she has had shortness of breath which began at 8 PM yesterday evening, 10 hours PTA.  Patient states she works third shift and when she got up to get ready she noticed dyspnea on exertion.  Patient states she used her albuterol inhaler.  Patient states she continued to get ready, however 2 hours later her shortness of breath returned.  Patient states she used her nebulizer treatment without resolve of symptoms.  Patient states she laid down which improved her symptoms, however they returned.  She used her albuterol inhaler and additional time.  Patient states at that time she called for EMS.  Patient states she has been admitted to the hospital 2 times over the last 60 days, was recently 30 days ago for similar symptoms.  Patient states she was seen by her pulmonologist which he states that possibly her acid reflux is causing her COPD exacerbations.  Patient states last time she used oral steroids, prednisone, was when she was admitted to the hospital last month.  Admits to ICU hospitalization, however denies prior intubation.  States she has had some mild chest tightness which begins when she becomes extremely short of breath.  Denies home oxygen use.  Denies fever, chills, nausea, vomiting, nasal congestion, rhinorrhea, the aches and pains, pleuritic chest pain, abdominal pain, diarrhea dysuria.  Patient states she does have a cough which is nonproductive in nature.  Patient states this is chronic.  She denies PND, orthopnea or lower extremity swelling.  Has been compliant with all her home medications.  Sx are  gradually worsening.  Per EMS, patient was given steroids, breathing treatment in route.  History obtained from patient.  No interpreter was used.     HPI  Past Medical History:  Diagnosis Date  . Anxiety   . Asthma   . COPD (chronic obstructive pulmonary disease) ([place])   . Depression     Patient Active Problem List   Diagnosis Date Noted  . Elevated MCV 01/31/2018  . Vitamin B12 deficiency 01/31/2018  . Acute on chronic respiratory failure with hypoxia ([place]) 01/31/2018  . Acute on chronic respiratory failure ([place]) 12/31/2017  . Respiratory failure, acute-on-chronic ([place]) 12/31/2017  . COPD exacerbation ([place]) 09/18/2017  . Hypoxia   . Hyperlipidemia 08/23/2017  . Depression 08/23/2017  . Anxiety 08/23/2017  . Hypercalcemia 08/23/2017  . Thrombocytosis ([place]) 08/23/2017  . COPD with acute exacerbation ([place]) 08/23/2017  . Acute respiratory failure with hypoxia and hypercapnia ([place]) 08/23/2017  . ADD (attention deficit disorder) 08/30/2012  . Bipolar disorder, unspecified ([place]) 08/30/2012  . COPD  GOLD ? III with increased Eos 08/30/2012  . Tobacco abuse 08/30/2012    Past Surgical History:  Procedure Laterality Date  . ABDOMINAL HYSTERECTOMY     partial      OB History   No obstetric history on file.      Home Medications    Prior to Admission medications   Medication Sig Start Date End Date Taking? Authorizing Provider  albuterol (PROAIR HFA) 108 (90 Base) MCG/ACT inhaler Inhale 2 puffs into the lungs every 6 (six) hours as needed for wheezing  or shortness of breath.    [provider]  ALPRAZolam Duanne Moron) 0.25 MG tablet Take 0.25 mg by mouth daily as needed for anxiety.  07/21/17   [provider]  atorvastatin (LIPITOR) 20 MG tablet Take 20 mg by mouth at bedtime. 07/27/17   [provider]  budesonide-formoterol (SYMBICORT) 160-4.5 MCG/ACT inhaler Take 2 puffs first thing in am and then another 2 puffs about 12 hours later. 02/11/18    Tanda Rockers, MD  Dexmethylphenidate HCl 40 MG CP24 Take 40 mg by mouth at bedtime. 07/21/17   [provider]  famotidine (PEPCID) 20 MG tablet One at bedtime 01/18/18   Tanda Rockers, MD  ipratropium-albuterol (DUONEB) 0.5-2.5 (3) MG/3ML SOLN Take 3 mLs by nebulization every 6 (six) hours as needed. Patient taking differently: Take 3 mLs by nebulization every 6 (six) hours as needed (shortness of breath).  01/02/18   Dana Allan I, MD  lamoTRIgine (LAMICTAL) 200 MG tablet Take 200 mg by mouth at bedtime.     [provider]  pantoprazole (PROTONIX) 40 MG tablet Take 1 tablet (40 mg total) by mouth daily. Take 30-60 min before first meal of the day 01/18/18   Tanda Rockers, MD  QUEtiapine (SEROQUEL) 25 MG tablet Take 50 mg by mouth at bedtime.    [provider]  risperiDONE (RISPERDAL) 1 MG tablet Take 3 mg by mouth at bedtime.  07/06/17   [provider]  Tiotropium Bromide Monohydrate (SPIRIVA RESPIMAT) 2.5 MCG/ACT AERS 2 pffs first thing when wake up 02/11/18   Tanda Rockers, MD  venlafaxine XR (EFFEXOR-XR) 150 MG 24 hr capsule Take 150 mg by mouth at bedtime.     [provider]  Vitamin D, Ergocalciferol, (DRISDOL) 50000 units CAPS capsule Take 50,000 Units by mouth once a week. On Saturdays 06/18/17   [provider]    Family History Family History  Problem Relation Age of Onset  . Asthma Mother   . Lung cancer Father   . COPD Father     Social History Social History   Tobacco Use  . Smoking status: Former Smoker    Packs/day: 1.00    Years: 40.00    Pack years: 40.00    Types: Cigarettes    Last attempt to quit: 08/23/2017    Years since quitting: 0.5  . Smokeless tobacco: Never Used  Substance Use Topics  . Alcohol use: No  . Drug use: No     Allergies   Sulfa antibiotics   Review of Systems Review of Systems  Constitutional: Negative.   HENT: Negative.   Respiratory: Positive for cough, chest  tightness, shortness of breath and wheezing. Negative for apnea, choking and stridor.   Cardiovascular: Negative.   Gastrointestinal: Negative.   Genitourinary: Negative.   Musculoskeletal: Negative.   Skin: Negative.   Neurological: Negative.   All other systems reviewed and are negative.    Physical Exam Updated Vital Signs BP (!) 133/95 (BP Location: Right Arm)   Pulse (!) 109   Temp 98.2 F (36.8 C) (Oral)   Resp (!) 21   Ht 5\' 3"  (1.6 m)   Wt 89.4 kg   SpO2 91%   BMI 34.90 kg/m   Physical Exam Vitals signs and nursing note reviewed.  Constitutional:      General: She is not in acute distress.    Appearance: She is well-developed. She is not ill-appearing or toxic-appearing.     Comments: Sitting up in bed  on initial evaluation.  HENT:     Head: Atraumatic.     Mouth/Throat:     Comments: Mucous membranes moist.  Posterior oropharynx clear. Eyes:     Pupils: Pupils are equal, round, and reactive to light.  Neck:     Musculoskeletal: Normal range of motion.     Comments: No neck stiffness or neck rigidity.  No carotid bruits.  No cervical lymphadenopathy. Cardiovascular:     Rate and Rhythm: Tachycardia present.  Pulmonary:     Effort: No respiratory distress.     Comments: Diffuse expiratory wheezes throughout.  She is mildly tachypneic at 21 breaths/min.  She has no accessory muscle usage.  She is able to speak in short sentences without difficulty. Abdominal:     General: There is no distension.     Comments: Soft, nontender without rebound or guarding.  Musculoskeletal: Normal range of motion.     Comments: Moves all extremities without difficulty.  Ecchymosis to right shin.  Patient states she fell 2 weeks ago.  No difficulty with ambulation.  No tenderness, erythema, warmth.  Full range of motion bilateral lower extremities without difficulty.  Skin:    General: Skin is warm and dry.  Neurological:     Mental Status: She is alert.      ED Treatments /  Results  Labs (all labs ordered are listed, but only abnormal results are displayed) Labs Reviewed  BASIC METABOLIC PANEL - Abnormal; Notable for the following components:      Result Value   Glucose, Bld 116 (*)    All other components within normal limits  CBC - Abnormal; Notable for the following components:   MCV 102.7 (*)    All other components within normal limits  BLOOD GAS, VENOUS  I-STAT TROPONIN, ED    EKG EKG Interpretation  Date/Time:  Thursday March 07 2018 06:13:10 EST Ventricular Rate:  106 PR Interval:    QRS Duration: 99 QT Interval:  370 QTC Calculation: 492 R Axis:   92 Text Interpretation:  Sinus tachycardia Right axis deviation Borderline prolonged QT interval No significant change was found Confirmed by Shanon Rosser [phone]) on 03/07/2018 6:16:40 AM   Radiology Dg Chest 2 View  Result Date: 03/07/2018 CLINICAL DATA:  68 year old female with shortness of breath. EXAM: CHEST - 2 VIEW COMPARISON:  Chest radiograph dated 01/31/2018 FINDINGS: Mild chronic bronchitic changes. No focal consolidation, pleural effusion, or pneumothorax. The cardiac silhouette is within normal limits. No acute osseous pathology. IMPRESSION: No active cardiopulmonary disease. Electronically Signed   By: Anner Crete M.D.   On: 03/07/2018 06:52    Procedures .Critical Care Performed by: Nettie Elm, PA-C Authorized by: Nettie Elm, PA-C   Critical care provider statement:    Critical care time (minutes):  45   Critical care was necessary to treat or prevent imminent or life-threatening deterioration of the following conditions:  Respiratory failure   Critical care was time spent personally by me on the following activities:  Discussions with consultants, evaluation of patient's response to treatment, examination of patient, ordering and performing treatments and interventions, ordering and review of laboratory studies, ordering and review of radiographic  studies, pulse oximetry, re-evaluation of patient's condition, obtaining history from patient or surrogate and review of old charts   (including critical care time)  Medications Ordered in ED Medications  albuterol (PROVENTIL) (2.5 MG/3ML) 0.083% nebulizer solution 5 mg (5 mg Nebulization Given 03/07/18 0621)  ipratropium (ATROVENT) nebulizer solution 0.5 mg (0.5 mg  Nebulization Given 03/07/18 0621)  albuterol (PROVENTIL,VENTOLIN) solution continuous neb (15 mg/hr Nebulization Given 03/07/18 0739)     Initial Impression / Assessment and Plan / ED Course  I have reviewed the triage vital signs and the nursing notes.  Pertinent labs & imaging results that were available during my care of the patient were reviewed by me and considered in my medical decision making (see chart for details).  68 year old female presents for shortness of breath.  History of COPD.  Afebrile, nonseptic, non-ill-appearing.  Given steroids, albuterol per EMS.  Symptom onset 10 hours PTA.  History of ICU hospitalization, no history of intubation.  Able to speak in full sentences without difficulty on evaluation.  No accessory muscle usage.  Diffuse wxpiratory wheezing throughout.  She is tachypneic and tachycardic.  No preceding URI symptoms.  Mild chest tightness with shortness of breath.  Will obtain labs, chest x-ray, EKG, give additional DuoNeb treatment and reevaluate.  0710: On reevaluation patient with continued moderate expiratory wheezing, patient states she feels improvement.  Given continuous wheezing, oxygen saturation 94% will obtain VBG as well as give 1 hour continuous nebulizer.  Will reevaluate.  CBC without leukocytosis, and [place] panel without electrolyte, renal or liver abnormality, troponin negative, venous blood gas with abnormality, chest x-ray without infiltrates, pulmonary edema, cardiomegaly or widened mediastinum, EKG sinus tachycardia without ST, T changes.  No ischemia. Low suspicion for ACS, PE,  dissection causing patient symptoms.  0815: Patient currently on continuous nebulizer. Oxygen saturation 94% on room air. Will reevaluate.  0915: Patient with continued wheeze after 1 hr nebulizer.  Patient oxygen saturation 88% on room air with good waveform without ambulation. Mildly tachypneic at 22 breaths/min. No accessory muscle usage. Patient placed on 2 L via nasal cannula with saturations returning to 92%. Will consult hospitalist for admission for COPD exacerbation.  1655: Consulted with Dr. Posey Pronto who agrees to evaluated patient for admission.  Patient seen and evaluated by my attending, Dr. Florina Ou who agrees with above treatment, plan and disposition.      Final Clinical Impressions(s) / ED Diagnoses   Final diagnoses:  COPD exacerbation [facility])    ED Discharge Orders    None       Fatime Biswell A, PA-C 03/07/18 1012    Molpus, John, MD 03/07/18 2232

## 2018-03-07 NOTE — Progress Notes (Signed)
I received report from on Ms Julia Daniels from [place] in the ED AT 12:45

## 2018-03-07 NOTE — ED Notes (Signed)
ED TO INPATIENT HANDOFF REPORT  Name/Age/Gender Julia Daniels 68 y.o. female  Code Status    Code Status Orders  (From admission, onward)         Start     Ordered   03/07/18 0950  Full code  Continuous     03/07/18 0951        Code Status History    Date Active Date Inactive Code Status Order ID Comments User Context   01/31/2018 0730 02/02/2018 1616 Full Code [id number]  Reubin Milan, MD ED   01/31/2018 0727 01/31/2018 0730 Full Code [id number]  Reubin Milan, MD ED   12/31/2017 1705 01/02/2018 2116 Full Code [id number]  Bonnell Public, MD Inpatient   09/18/2017 0510 09/19/2017 1455 Full Code [id number]  Alma Friendly, MD Inpatient   08/23/2017 1102 08/25/2017 1446 Full Code [id number]  Kerney Elbe, DO Inpatient      Home/SNF/Other Home  Chief Complaint Shortness of Breath  Level of Care/Admitting Diagnosis ED Disposition    ED Disposition Condition [facility] Area: [facility] [[id number]]  Level of Care: Med-Surg [16]  Diagnosis: COPD with acute exacerbation [facility]) [[id number]]  Admitting Physician: Lavina Hamman [[id number]]  Attending Physician: Lavina Hamman [[id number]]  PT Class (Do Not Modify): Observation [104]  PT Acc Code (Do Not Modify): Observation [10022]       Medical History Past Medical History:  Diagnosis Date  . Anxiety   . Asthma   . COPD (chronic obstructive pulmonary disease) ([place])   . Depression     Allergies Allergies  Allergen Reactions  . Sulfa Antibiotics Other (See Comments)    Mouth gets raw    IV Location/Drains/Wounds Patient Lines/Drains/Airways Status   Active Line/Drains/Airways    Name:   Placement date:   Placement time:   Site:   Days:   Peripheral IV 03/07/18 Right Hand   03/07/18    0617    Hand   less than 1   Wound 02/07/13 Laceration Finger (Comment which one) Left small 1 cm lac to ring finger   02/07/13    1128    Finger (Comment which one)   1854           Labs/Imaging Results for orders placed or performed during the hospital encounter of 03/07/18 (from the past 48 hour(s))  Basic metabolic panel     Status: Abnormal   Collection Time: 03/07/18  6:08 AM  Result Value Ref Range   Sodium 142 135 - 145 mmol/L   Potassium 4.2 3.5 - 5.1 mmol/L   Chloride 107 98 - 111 mmol/L   CO2 26 22 - 32 mmol/L   Glucose, Bld 116 (H) 70 - 99 mg/dL   BUN 12 8 - 23 mg/dL   Creatinine, Ser 0.96 0.44 - 1.00 mg/dL   Calcium 9.8 8.9 - 10.3 mg/dL   GFR calc non Af Amer >60 >60 mL/min   GFR calc Af Amer >60 >60 mL/min   Anion gap 9 5 - 15    Comment: Performed at [facility], [place] [address] [place], [place] [address]  CBC     Status: Abnormal   Collection Time: 03/07/18  6:08 AM  Result Value Ref Range   WBC 7.5 4.0 - 10.5 K/uL   RBC 4.08 3.87 - 5.11 MIL/uL   Hemoglobin 13.2 12.0 - 15.0 g/dL   HCT 41.9 36.0 - 46.0 %   MCV 102.7 (H)  80.0 - 100.0 fL   MCH 32.4 26.0 - 34.0 pg   MCHC 31.5 30.0 - 36.0 g/dL   RDW 12.8 11.5 - 15.5 %   Platelets 258 150 - 400 K/uL   nRBC 0.0 0.0 - 0.2 %    Comment: Performed at [facility], [place] [address], [place], [place] [address]  I-Stat Troponin, ED (not at [facility])     Status: None   Collection Time: 03/07/18  6:38 AM  Result Value Ref Range   Troponin i, poc 0.00 0.00 - 0.08 ng/mL   Comment 3            Comment: Due to the release kinetics of cTnI, a negative result within the first hours of the onset of symptoms does not rule out myocardial infarction with certainty. If myocardial infarction is still suspected, repeat the test at appropriate intervals.   Blood gas, venous (at [facility] and [facility])     Status: None   Collection Time: 03/07/18  7:27 AM  Result Value Ref Range   pH, Ven 7.329 7.250 - 7.430   pCO2, Ven 53.9 44.0 - 60.0 mmHg   pO2, Ven below reportable range 32.0 - 45.0 mmHg    Comment: CRITICAL RESULT CALLED TO, READ BACK BY AND VERIFIED WITH: john mulpus md by  [place] dunlap rrt rcp on 03/07/18 at 0730    Bicarbonate 27.5 20.0 - 28.0 mmol/L   Acid-Base Excess 1.1 0.0 - 2.0 mmol/L   O2 Saturation 53.8 %   Patient temperature 98.6    Collection site VEIN    Drawn by DRAWN BY RN    Sample type VENOUS     Comment: Performed at [facility], [place] [address], [place], [place] [address]   Dg Chest 2 View  Result Date: 03/07/2018 CLINICAL DATA:  68 year old female with shortness of breath. EXAM: CHEST - 2 VIEW COMPARISON:  Chest radiograph dated 01/31/2018 FINDINGS: Mild chronic bronchitic changes. No focal consolidation, pleural effusion, or pneumothorax. The cardiac silhouette is within normal limits. No acute osseous pathology. IMPRESSION: No active cardiopulmonary disease. Electronically Signed   By: Anner Crete M.D.   On: 03/07/2018 06:52   EKG Interpretation  Date/Time:  Thursday March 07 2018 06:13:10 EST Ventricular Rate:  106 PR Interval:    QRS Duration: 99 QT Interval:  370 QTC Calculation: 492 R Axis:   92 Text Interpretation:  Sinus tachycardia Right axis deviation Borderline prolonged QT interval No significant change was found Confirmed by Shanon Rosser [phone]) on 03/07/2018 6:16:40 AM Also confirmed by Shanon Rosser [phone]), editor [place], [place] [phone])  on 03/07/2018 9:46:40 AM   Pending Labs Unresulted Labs (From admission, onward)    Start     Ordered   03/08/18 0500  CBC  Tomorrow morning,   R     03/07/18 0951   03/08/18 7096  Basic metabolic panel  Tomorrow morning,   R     03/07/18 0951          Vitals/Pain Today's Vitals   03/07/18 0741 03/07/18 0917 03/07/18 0930 03/07/18 1030  BP:  99/76 114/69 98/81  Pulse:  (!) 118 (!) 114 (!) 109  Resp:  18 (!) 23 18  Temp:      TempSrc:      SpO2: 91% 90% 92% 93%  Weight:      Height:      PainSc:        Isolation Precautions No active isolations  Medications Medications  ALPRAZolam (XANAX) tablet 0.25 mg (has no administration in time  range)  atorvastatin (LIPITOR) tablet 20 mg (has no administration in time range)  Dexmethylphenidate HCl CP24 40 mg (has no administration in time range)  famotidine (PEPCID) tablet 20 mg (has no administration in time range)  lamoTRIgine (LAMICTAL) tablet 200 mg (has no administration in time range)  ipratropium-albuterol (DUONEB) 0.5-2.5 (3) MG/3ML nebulizer solution 3 mL (has no administration in time range)  pantoprazole (PROTONIX) EC tablet 40 mg (has no administration in time range)  QUEtiapine (SEROQUEL) tablet 50 mg (has no administration in time range)  risperiDONE (RISPERDAL) tablet 3 mg (has no administration in time range)  venlafaxine XR (EFFEXOR-XR) 24 hr capsule 150 mg (has no administration in time range)  enoxaparin (LOVENOX) injection 40 mg (has no administration in time range)  sodium chloride flush (NS) 0.9 % injection 3 mL (has no administration in time range)  methylPREDNISolone sodium succinate (SOLU-MEDROL) 40 mg/mL injection 40 mg (has no administration in time range)    Followed by  predniSONE (DELTASONE) tablet 40 mg (has no administration in time range)  albuterol (PROVENTIL) (2.5 MG/3ML) 0.083% nebulizer solution 5 mg (5 mg Nebulization Given 03/07/18 0621)  ipratropium (ATROVENT) nebulizer solution 0.5 mg (0.5 mg Nebulization Given 03/07/18 0621)  albuterol (PROVENTIL,VENTOLIN) solution continuous neb (15 mg/hr Nebulization Given 03/07/18 0739)    Mobility walks with device

## 2018-03-07 NOTE — H&P (Signed)
[facility] History and Physical   Patient: Julia Daniels PFX:[id number]   PCP: [facility] DOB: 01-26-1950   DOA: 03/07/2018   DOS: 03/07/2018   DOS: the patient was seen and examined on 03/07/2018  Patient coming from: The patient is coming from home  Chief Complaint: shortnes of breath   HPI: Julia Daniels is a 68 y.o. female with Past medical history of COPD, anxiety, bipolar disorder, former smoker, quit August 2019, ADHD. Patient presented to the hospital with complaints of cough and shortness of breath. She mentioned that she was at her baseline until last night.  She started having complaints of shortness of breath in the middle of the night. She tried her rescue inhaler followed by a nebulizer and repeated the cycle again, despite which he was not feeling better and therefore she decided to come to the hospital.  She reports dry cough.  No fever no chills.  She reports that at her work there are sick contact.  No nausea no vomiting.  No diarrhea.  No chest pain.  No rash anywhere. She denies any active smoking exposure to chemicals or fumes. She denies any travel history as well. She does not use any oxygen at her baseline.  ED Course: Patient received another albuterol nebulization followed by a continuous 1 hour nebulization despite which she remained short of breath, mildly hypoxic, tachycardic and had active wheezing and therefore was referred for admission.  At her baseline ambulates without any support And is independent for most of her ADL; manages her medication on her own.  Review of Systems: as mentioned in the history of present illness.  All other systems reviewed and are negative.  Past Medical History:  Diagnosis Date  . Anxiety   . Asthma   . COPD (chronic obstructive pulmonary disease) ([employer])   . Depression    Past Surgical History:  Procedure Laterality Date  . ABDOMINAL HYSTERECTOMY     partial    Social History:  reports  that she quit smoking about 6 months ago. Her smoking use included cigarettes. She has a 40.00 pack-year smoking history. She has never used smokeless tobacco. She reports that she does not drink alcohol or use drugs.  Allergies  Allergen Reactions  . Sulfa Antibiotics Other (See Comments)    Mouth gets raw     Family History  Problem Relation Age of Onset  . Asthma Mother   . Lung cancer Father   . COPD Father      Prior to Admission medications   Medication Sig Start Date End Date Taking? Authorizing Provider  albuterol (PROAIR HFA) 108 (90 Base) MCG/ACT inhaler Inhale 2 puffs into the lungs every 6 (six) hours as needed for wheezing or shortness of breath.   Yes [provider]  ALPRAZolam (XANAX) 0.25 MG tablet Take 0.25 mg by mouth daily as needed for anxiety.  07/21/17  Yes [provider]  atorvastatin (LIPITOR) 20 MG tablet Take 20 mg by mouth at bedtime. 07/27/17  Yes [provider]  bismuth subsalicylate (PEPTO BISMOL) 262 MG chewable tablet Chew 262-524 mg by mouth daily as needed for diarrhea or loose stools.   Yes [provider]  budesonide-formoterol (SYMBICORT) 160-4.5 MCG/ACT inhaler Take 2 puffs first thing in am and then another 2 puffs about 12 hours later. Patient taking differently: Inhale 2 puffs into the lungs 2 (two) times daily.  02/11/18  Yes Tanda Rockers, MD  Dexmethylphenidate HCl 40 MG CP24 Take 40  mg by mouth daily.  07/21/17  Yes [provider]  famotidine (PEPCID) 20 MG tablet One at bedtime Patient taking differently: Take 20 mg by mouth at bedtime.  01/18/18  Yes Tanda Rockers, MD  ipratropium-albuterol (DUONEB) 0.5-2.5 (3) MG/3ML SOLN Take 3 mLs by nebulization every 6 (six) hours as needed. Patient taking differently: Take 3 mLs by nebulization every 6 (six) hours as needed (shortness of breath).  01/02/18  Yes Dana Allan I, MD  lamoTRIgine (LAMICTAL) 200 MG tablet Take 200 mg by mouth at bedtime.     Yes [provider]  pantoprazole (PROTONIX) 40 MG tablet Take 1 tablet (40 mg total) by mouth daily. Take 30-60 min before first meal of the day 01/18/18  Yes Tanda Rockers, MD  QUEtiapine (SEROQUEL) 50 MG tablet Take 100 mg by mouth at bedtime. 02/28/18  Yes [provider]  risperiDONE (RISPERDAL) 1 MG tablet Take 3 mg by mouth at bedtime.  07/06/17  Yes [provider]  Tiotropium Bromide Monohydrate (SPIRIVA RESPIMAT) 2.5 MCG/ACT AERS 2 pffs first thing when wake up Patient taking differently: Inhale 2 puffs into the lungs daily.  02/11/18  Yes Tanda Rockers, MD  venlafaxine XR (EFFEXOR-XR) 150 MG 24 hr capsule Take 150 mg by mouth at bedtime.    Yes [provider]  Vitamin D, Ergocalciferol, (DRISDOL) 50000 units CAPS capsule Take 50,000 Units by mouth every Saturday.  06/18/17  Yes [provider]    Physical Exam: Vitals:   03/07/18 0741 03/07/18 0917 03/07/18 0930 03/07/18 1030  BP:  99/76 114/69 98/81  Pulse:  (!) 118 (!) 114 (!) 109  Resp:  18 (!) 23 18  Temp:      TempSrc:      SpO2: 91% 90% 92% 93%  Weight:      Height:        General: Alert, Awake and Oriented to Time, Place and Person. Appear in mild distress, affect appropriate Eyes: PERRL, Conjunctiva normal ENT: Oral Mucosa clear moist. Neck: no JVD, no Abnormal Mass Or lumps Cardiovascular: S1 and S2 Present, no Murmur, Peripheral Pulses Present Respiratory: normal respiratory effort, Bilateral Air entry equal and Decreased, no use of accessory muscle, no Crackles, bilateral  wheezes Abdomen: Bowel Sound present, Soft and no tenderness, no hernia Skin: no redness, no Rash, no induration Extremities: no Pedal edema, n calf tenderness Neurologic: Grossly no focal neuro deficit. Bilaterally Equal motor strength  Labs on Admission:  CBC: Recent Labs  Lab 03/07/18 0608  WBC 7.5  HGB 13.2  HCT 41.9  MCV 102.7*  PLT 244   Basic Metabolic Panel: Recent Labs  Lab  03/07/18 0608  NA 142  K 4.2  CL 107  CO2 26  GLUCOSE 116*  BUN 12  CREATININE 0.96  CALCIUM 9.8   GFR: Estimated Creatinine Clearance: 62 mL/min (by C-G formula based on SCr of 0.96 mg/dL). Liver Function Tests: No results for input(s): AST, ALT, ALKPHOS, BILITOT, PROT, ALBUMIN in the last 168 hours. No results for input(s): LIPASE, AMYLASE in the last 168 hours. No results for input(s): AMMONIA in the last 168 hours. Coagulation Profile: No results for input(s): INR, PROTIME in the last 168 hours. Cardiac Enzymes: No results for input(s): CKTOTAL, CKMB, CKMBINDEX, TROPONINI in the last 168 hours. BNP (last 3 results) No results for input(s): PROBNP in the last 8760 hours. HbA1C: No results for input(s): HGBA1C in the last 72 hours. CBG: No results for input(s): GLUCAP in the  last 168 hours. Lipid Profile: No results for input(s): CHOL, HDL, LDLCALC, TRIG, CHOLHDL, LDLDIRECT in the last 72 hours. Thyroid Function Tests: No results for input(s): TSH, T4TOTAL, FREET4, T3FREE, THYROIDAB in the last 72 hours. Anemia Panel: No results for input(s): VITAMINB12, FOLATE, FERRITIN, TIBC, IRON, RETICCTPCT in the last 72 hours. Urine analysis:    Component Value Date/Time   BILIRUBINUR n 09/23/2012 1111   PROTEINUR n 09/23/2012 1111   UROBILINOGEN 0.2 09/23/2012 1111   NITRITE n 09/23/2012 1111   LEUKOCYTESUR Negative 09/23/2012 1111    Radiological Exams on Admission: Dg Chest 2 View  Result Date: 03/07/2018 CLINICAL DATA:  68 year old female with shortness of breath. EXAM: CHEST - 2 VIEW COMPARISON:  Chest radiograph dated 01/31/2018 FINDINGS: Mild chronic bronchitic changes. No focal consolidation, pleural effusion, or pneumothorax. The cardiac silhouette is within normal limits. No acute osseous pathology. IMPRESSION: No active cardiopulmonary disease. Electronically Signed   By: Anner Crete M.D.   On: 03/07/2018 06:52   Assessment/Plan 1. COPD with acute exacerbation  [facility]) Patient comes with complaints of shortness of breath and cough. Chest x-ray negative. Does not appear to have any fever or chills right now. We will admit the patient to the hospital as the patient has received multiple nebulizing therapy without any benefit and remains borderline hypoxic with saturations in 88. Will continue with duo nebs, IV steroids for today switch to oral steroids tomorrow. Do not think the patient requires any antibiotic right now. Anticipating discharge home tomorrow.  2.  ADD. Continue home medication.  3.  Anxiety. Bipolar disorder. We will continue home regimen.  4.  Dyslipidemia. Continue home regimen.  Nutrition: Regular diet DVT Prophylaxis: subcutaneous Heparin  Advance goals of care discussion: Full code  Consults: none  Family Communication: no family was present at bedside, at the time of interview.  Disposition: Admitted as observation, med-surge unit. Likely to be discharged home, in 1 day.  Author: Berle Mull, MD [facility] 03/07/2018  To reach On-call, see care teams to locate the attending and reach out to them via [email or web address]. If 7PM-7AM, please contact night-coverage If you still have difficulty reaching the attending provider, please page the [facility] (Director on Call) for [facility] on amion for assistance.

## 2018-03-07 NOTE — ED Triage Notes (Signed)
Per EMS: Pt from home.  C/o SHOB since 2000 last night.  Took rescue inhaler at 0130.  Last time she felt like this, she had to get BIPAP.  Given duoneb and solumedrol.  20g in rt hand.

## 2018-03-07 NOTE — ED Notes (Signed)
Bed: RESA Expected date:  Expected time:  Means of arrival:  Comments: COPD

## 2018-03-07 NOTE — ED Notes (Signed)
Bed: QF90 Expected date:  Expected time:  Means of arrival:  Comments: Hold for RES a

## 2018-03-07 NOTE — ED Notes (Signed)
Pt O2 on room air was 88% and patient reports feeling SOB. Pt not able to get up to ambulate at this time.

## 2018-03-07 NOTE — ED Notes (Signed)
Respiratory made aware of continuous neb ordered and VBG needing ran.

## 2018-03-08 DIAGNOSIS — J441 Chronic obstructive pulmonary disease with (acute) exacerbation: Secondary | ICD-10-CM | POA: Diagnosis present

## 2018-03-08 DIAGNOSIS — F988 Other specified behavioral and emotional disorders with onset usually occurring in childhood and adolescence: Secondary | ICD-10-CM | POA: Diagnosis present

## 2018-03-08 DIAGNOSIS — Z7951 Long term (current) use of inhaled steroids: Secondary | ICD-10-CM | POA: Diagnosis not present

## 2018-03-08 DIAGNOSIS — E785 Hyperlipidemia, unspecified: Secondary | ICD-10-CM | POA: Diagnosis present

## 2018-03-08 DIAGNOSIS — Z801 Family history of malignant neoplasm of trachea, bronchus and lung: Secondary | ICD-10-CM | POA: Diagnosis not present

## 2018-03-08 DIAGNOSIS — F319 Bipolar disorder, unspecified: Secondary | ICD-10-CM | POA: Diagnosis present

## 2018-03-08 DIAGNOSIS — Z87891 Personal history of nicotine dependence: Secondary | ICD-10-CM | POA: Diagnosis not present

## 2018-03-08 DIAGNOSIS — Z720 Tobacco use: Secondary | ICD-10-CM

## 2018-03-08 DIAGNOSIS — J9601 Acute respiratory failure with hypoxia: Secondary | ICD-10-CM | POA: Diagnosis present

## 2018-03-08 DIAGNOSIS — K219 Gastro-esophageal reflux disease without esophagitis: Secondary | ICD-10-CM | POA: Diagnosis present

## 2018-03-08 DIAGNOSIS — F419 Anxiety disorder, unspecified: Secondary | ICD-10-CM | POA: Diagnosis present

## 2018-03-08 DIAGNOSIS — Z825 Family history of asthma and other chronic lower respiratory diseases: Secondary | ICD-10-CM | POA: Diagnosis not present

## 2018-03-08 LAB — CBC
HCT: 41.5 % (ref 36.0–46.0)
Hemoglobin: 12.4 g/dL (ref 12.0–15.0)
MCH: 31.7 pg (ref 26.0–34.0)
MCHC: 29.9 g/dL — ABNORMAL LOW (ref 30.0–36.0)
MCV: 106.1 fL — ABNORMAL HIGH (ref 80.0–100.0)
Platelets: 286 10*3/uL (ref 150–400)
RBC: 3.91 MIL/uL (ref 3.87–5.11)
RDW: 13.2 % (ref 11.5–15.5)
WBC: 10.6 10*3/uL — ABNORMAL HIGH (ref 4.0–10.5)
nRBC: 0 % (ref 0.0–0.2)

## 2018-03-08 LAB — BASIC METABOLIC PANEL
Anion gap: 6 (ref 5–15)
BUN: 14 mg/dL (ref 8–23)
CO2: 25 mmol/L (ref 22–32)
Calcium: 9.7 mg/dL (ref 8.9–10.3)
Chloride: 107 mmol/L (ref 98–111)
Creatinine, Ser: 0.81 mg/dL (ref 0.44–1.00)
GFR calc Af Amer: >60 mL/min (ref >60)
GFR calc non Af Amer: >60 mL/min (ref >60)
Glucose, Bld: 136 mg/dL — ABNORMAL HIGH (ref 70–99)
Potassium: 4.8 mmol/L (ref 3.5–5.1)
SODIUM: 138 mmol/L (ref 135–145)

## 2018-03-08 LAB — MRSA PCR SCREENING: MRSA BY PCR: NEGATIVE

## 2018-03-08 MED ORDER — ORAL CARE MOUTH RINSE
15.0000 mL | Freq: Two times a day (BID) | OROMUCOSAL | Status: DC
  Administered 2018-03-08: 15 mL via OROMUCOSAL

## 2018-03-08 MED ORDER — ALBUTEROL SULFATE (2.5 MG/3ML) 0.083% IN NEBU
2.5000 mg | INHALATION_SOLUTION | RESPIRATORY_TRACT | Status: DC | PRN
  Administered 2018-03-09: 2.5 mg via RESPIRATORY_TRACT
  Filled 2018-03-08: qty 3

## 2018-03-08 MED ORDER — ARFORMOTEROL TARTRATE 15 MCG/2ML IN NEBU
15.0000 ug | INHALATION_SOLUTION | Freq: Two times a day (BID) | RESPIRATORY_TRACT | Status: DC
  Administered 2018-03-08 – 2018-03-09 (×2): 15 ug via RESPIRATORY_TRACT
  Filled 2018-03-08 (×4): qty 2

## 2018-03-08 MED ORDER — ALPRAZOLAM 0.25 MG PO TABS
0.2500 mg | ORAL_TABLET | Freq: Two times a day (BID) | ORAL | Status: DC
  Administered 2018-03-08 – 2018-03-09 (×3): 0.25 mg via ORAL
  Filled 2018-03-08 (×3): qty 1

## 2018-03-08 MED ORDER — DOXYCYCLINE HYCLATE 100 MG PO TABS
100.0000 mg | ORAL_TABLET | Freq: Two times a day (BID) | ORAL | Status: DC
  Administered 2018-03-08 – 2018-03-09 (×3): 100 mg via ORAL
  Filled 2018-03-08 (×3): qty 1

## 2018-03-08 MED ORDER — BUDESONIDE 0.25 MG/2ML IN SUSP
0.2500 mg | Freq: Two times a day (BID) | RESPIRATORY_TRACT | Status: DC
  Administered 2018-03-08 – 2018-03-09 (×3): 0.25 mg via RESPIRATORY_TRACT
  Filled 2018-03-08 (×3): qty 2

## 2018-03-08 MED ORDER — METHYLPREDNISOLONE SODIUM SUCC 40 MG IJ SOLR
40.0000 mg | Freq: Three times a day (TID) | INTRAMUSCULAR | Status: DC
  Administered 2018-03-08 – 2018-03-09 (×4): 40 mg via INTRAVENOUS
  Filled 2018-03-08 (×3): qty 1

## 2018-03-08 NOTE — Evaluation (Signed)
Physical Therapy Evaluation Patient Details Name: Julia Daniels MRN: [id number] DOB: 1950-10-26 Today's Date: 03/08/2018   History of Present Illness  68 yo female admitted to ED 2/27 with COPD exacerbation. Pt with recent hospitalization for same diagnosis, pt reporting feeling short of breath like this every 4 weeks. PMH includes anxiety, asthma, bipolar disorder, former smoker, depression.  Clinical Impression   Pt presents with dyspnea on exertion, O2sats 90-94% during session on 2LO2, and decreased tolerance for mobility. Pt to benefit from acute PT to address deficits. Pt ambulated 150 ft with no AD, required multiple standing rest breaks to recover dyspnea. Pt educated on pursed lip breathing technique to recover sats. Pt very anxious when dyspneic. No PT follow up recommended, pt mainly limited by respiratory status. PT to progress mobility as tolerated, and will continue to follow acutely.        Follow Up Recommendations No PT follow up    Equipment Recommendations  None recommended by PT    Recommendations for Other Services       Precautions / Restrictions Precautions Precautions: Fall Precaution Comments: on 2LO2 via   Restrictions Weight Bearing Restrictions: No      Mobility  Bed Mobility Overal bed mobility: Needs Assistance Bed Mobility: Supine to Sit     Supine to sit: HOB elevated;Supervision     General bed mobility comments: Supervision for safety. Increased time, use of bedrails.   Transfers Overall transfer level: Needs assistance Equipment used: None Transfers: Sit to/from Stand Sit to Stand: Supervision         General transfer comment: Supervision for safety, increased time to come to standing.   Ambulation/Gait Ambulation/Gait assistance: Min guard Gait Distance (Feet): 150 Feet Assistive device: None Gait Pattern/deviations: Step-through pattern;Decreased stride length;Wide base of support Gait velocity: decr    General  Gait Details: Min guard for safety, pt with 1 episode of mild unsteadiness that was pt-corrected. Pt reports it is because she has not been walking around very much. Pt requiring 5 standing rest breaks during ambulation to recover dyspnea 3/4 and sats. Pt on 2LO2 during ambulation, satting at 90% consistently.   Stairs            Wheelchair Mobility    Modified Rankin (Stroke Patients Only)       Balance Overall balance assessment: Mild deficits observed, not formally tested                                           Pertinent Vitals/Pain Pain Assessment: No/denies pain    Home Living Family/patient expects to be discharged to:: Private residence Living Arrangements: Children Available Help at Discharge: Family Type of Home: House Home Access: Stairs to enter Entrance Stairs-Rails: Right Entrance Stairs-Number of Steps: 4 Home Layout: One level Home Equipment: None      Prior Function Level of Independence: Independent         Comments: 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 Pt reports "I try to do too much, people at work tell me to take breaks". Pt reports she feels like she needs O2 at home because she gets out of breath a lot at work and home.      Hand Dominance   Dominant Hand: Right    Extremity/Trunk Assessment   Upper  Extremity Assessment Upper Extremity Assessment: Overall WFL for tasks assessed    Lower Extremity Assessment Lower Extremity Assessment: Overall WFL for tasks assessed    Cervical / Trunk Assessment Cervical / Trunk Assessment: Normal  Communication   Communication: No difficulties  Cognition Arousal/Alertness: Awake/alert Behavior During Therapy: Anxious Overall Cognitive Status: Within Functional Limits for tasks assessed                                 General Comments: Pt explains that she is scared of when  the next dyspneic episode will occur. When she gets short of breath, pt states she starts to get really anxious and this makes it worse. Pt reports having a difficult time recovering from dyspnea.       General Comments      Exercises     Assessment/Plan    PT Assessment Patient needs continued PT services  PT Problem List Cardiopulmonary status limiting activity;Decreased activity tolerance;Decreased mobility       PT Treatment Interventions Therapeutic activities;Gait training;Therapeutic exercise;Patient/family education;Balance training;Stair training;Functional mobility training    PT Goals (Current goals can be found in the Care Plan section)  Acute Rehab PT Goals Patient Stated Goal: go back to work  PT Goal Formulation: With patient Time For Goal Achievement: 03/22/18 Potential to Achieve Goals: Good    Frequency Min 3X/week   Barriers to discharge        Co-evaluation               AM-PAC PT "6 Clicks" Mobility  Outcome Measure Help needed turning from your back to your side while in a flat bed without using bedrails?: A Little Help needed moving from lying on your back to sitting on the side of a flat bed without using bedrails?: A Little Help needed moving to and from a bed to a chair (including a wheelchair)?: None Help needed standing up from a chair using your arms (e.g., wheelchair or bedside chair)?: None Help needed to walk in hospital room?: None Help needed climbing 3-5 steps with a railing? : A Little 6 Click Score: 21    End of Session Equipment Utilized During Treatment: Gait belt;Oxygen Activity Tolerance: Patient limited by fatigue;Treatment limited secondary to medical complications (Comment) Patient left: in chair;with chair alarm set;with call bell/phone within reach Nurse Communication: Mobility status PT Visit Diagnosis: Other abnormalities of gait and mobility (R26.89)    Time: 2751-7001 PT Time Calculation (min) (ACUTE ONLY): 28  min   Charges:   PT Evaluation $PT Eval Low Complexity: 1 Low PT Treatments $Gait Training: 8-22 mins        Julien Girt, PT Acute Rehabilitation Services Pager [phone]  Office [phone]   Roxine Caddy D Elonda Husky 03/08/2018, 4:32 PM

## 2018-03-08 NOTE — Progress Notes (Addendum)
[facility]                                                                              Patient Demographics  Julia Daniels, is a 68 y.o. female, DOB - Jun 26, 1950, FAO:[id number]  Admit date - 03/07/2018   Admitting Physician Lavina Hamman, MD  Outpatient Primary MD for the [facility] is [facility] [place] [facility]  Outpatient specialists:   LOS - 0  days   Medical records reviewed and are as summarized below:    Chief Complaint  Patient presents with  . Shortness of Breath       Brief summary   Patient is a 68 year old female with COPD, anxiety, bipolar disorder, former smoker, quit in August 2019, ADHD presented with wheezing, coughing and shortness of breath.  Per patient she was at her baseline until the night before the admission.  She tried to use the rescue inhaler followed by the nebulizer however no significant improvement.  Presented to ED with shortness of breath, mildly hypoxic, tachycardiac and active wheezing.  She was referred for admission.  She was admitted to hospital from 1/23 to 1/25 for COPD exacerbation  Assessment & Plan    Principal Problem: Acute respiratory failure with hypoxia secondary to COPD with acute exacerbation ([place]) -At the time of my examination, diffusely wheezing, O2 sats on room air 82%.  Placed on O2, 2 L, O2 sats improved to 92%.  patient does not have O2 at home -Continue IV steroids today, scheduled nebs every 4 hours, placed on Pulmicort and Brovana, added doxycycline, flutter valve -A lot of anxiety also contributing, takes Xanax at home as needed, will place on BID here  Active Problems:   ADD (attention deficit disorder), bipolar disorder -Continue risperidone, seroquel    Tobacco use - quit in August 2019  GERD - Continue PPI     Anxiety  -Continue Xanax  Code Status: full  DVT Prophylaxis:  Lovenox  Family Communication: Discussed in detail with the patient, all imaging results, lab results  explained to the patient    Disposition Plan: if improving, likely DC home in a.m.  Time Spent in minutes 25 minutes  Procedures:  None  Consultants:   None  Antimicrobials:   Anti-infectives (From admission, onward)   Start     Dose/Rate Route Frequency Ordered Stop   03/08/18 1000  azithromycin (ZITHROMAX) tablet 500 mg  Status:  Discontinued     500 mg Oral Daily 03/07/18 0951 03/07/18 0952   03/07/18 1000  azithromycin (ZITHROMAX) 500 mg in sodium chloride 0.9 % 250 mL IVPB  Status:  Discontinued     500 mg 250 mL/hr over 60 Minutes Intravenous Every 24 hours 03/07/18 0951 03/07/18 0952          Medications  Scheduled Meds: . ALPRAZolam  0.25 mg Oral BID  . arformoterol  15 mcg Nebulization BID  . atorvastatin  20 mg Oral QHS  . budesonide (PULMICORT) nebulizer solution  0.25 mg Nebulization BID  . dexmethylphenidate  40 mg Oral Daily  . enoxaparin (LOVENOX) injection  40 mg Subcutaneous Q24H  . famotidine  20 mg Oral QHS  .  ipratropium-albuterol  3 mL Nebulization Q4H  . lamoTRIgine  200 mg Oral QHS  . mouth rinse  15 mL Mouth Rinse BID  . methylPREDNISolone (SOLU-MEDROL) injection  40 mg Intravenous Q8H  . pantoprazole  40 mg Oral Daily  . QUEtiapine  50 mg Oral QHS  . risperiDONE  3 mg Oral QHS  . sodium chloride flush  3 mL Intravenous Q12H  . venlafaxine XR  150 mg Oral QHS   Continuous Infusions: PRN Meds:.albuterol      Subjective:   Julia Daniels was seen and examined today.  Still actively wheezing, hypoxic.  During examination, patient did not have the O2 on, O2 sats 82% on room air.  Patient denies dizziness, chest pain,  abdominal pain, N/V/D/C, new weakness, numbess, tingling.   Objective:   Vitals:   03/07/18 2224 03/08/18 0357 03/08/18 0607 03/08/18 0746  BP:   139/88   Pulse:   94 100  Resp:    20  Temp:   98.4 F (36.9 C)   TempSrc:   Oral   SpO2: 96% 95% 97% 96%  Weight:      Height:        Intake/Output Summary  (Last 24 hours) at 03/08/2018 1018 Last data filed at 03/08/2018 0953 Gross per 24 hour  Intake 1560 ml  Output 1700 ml  Net -140 ml     Wt Readings from Last 3 Encounters:  03/07/18 90 kg  02/11/18 89.6 kg  01/31/18 87.3 kg     Exam  General: Alert and oriented x 3, NAD  Eyes:   HEENT:    Cardiovascular: S1 S2 auscultated,. Regular rate and rhythm.  Respiratory: Bilateral expiratory wheezing diffusely  Gastrointestinal: Soft, nontender, nondistended, + bowel sounds  Ext: no pedal edema bilaterally  Neuro: No new deficits  Musculoskeletal: No digital cyanosis, clubbing  Skin: No rashes  Psych: Normal affect and demeanor, alert and oriented x3    Data Reviewed:  I have personally reviewed following labs and imaging studies  Micro Results No results found for this or any previous visit (from the past 240 hour(s)).  Radiology Reports Dg Chest 2 View  Result Date: 03/07/2018 CLINICAL DATA:  68 year old female with shortness of breath. EXAM: CHEST - 2 VIEW COMPARISON:  Chest radiograph dated 01/31/2018 FINDINGS: Mild chronic bronchitic changes. No focal consolidation, pleural effusion, or pneumothorax. The cardiac silhouette is within normal limits. No acute osseous pathology. IMPRESSION: No active cardiopulmonary disease. Electronically Signed   By: Anner Crete M.D.   On: 03/07/2018 06:52    Lab Data:  CBC: Recent Labs  Lab 03/07/18 0608 03/08/18 0327  WBC 7.5 10.6*  HGB 13.2 12.4  HCT 41.9 41.5  MCV 102.7* 106.1*  PLT 258 449   Basic Metabolic Panel: Recent Labs  Lab 03/07/18 0608 03/08/18 0327  NA 142 138  K 4.2 4.8  CL 107 107  CO2 26 25  GLUCOSE 116* 136*  BUN 12 14  CREATININE 0.96 0.81  CALCIUM 9.8 9.7   GFR: Estimated Creatinine Clearance: 73.7 mL/min (by C-G formula based on SCr of 0.81 mg/dL). Liver Function Tests: No results for input(s): AST, ALT, ALKPHOS, BILITOT, PROT, ALBUMIN in the last 168 hours. No results for  input(s): LIPASE, AMYLASE in the last 168 hours. No results for input(s): AMMONIA in the last 168 hours. Coagulation Profile: No results for input(s): INR, PROTIME in the last 168 hours. Cardiac Enzymes: No results for input(s): CKTOTAL, CKMB, CKMBINDEX, TROPONINI in the last 168 hours.  BNP (last 3 results) No results for input(s): PROBNP in the last 8760 hours. HbA1C: No results for input(s): HGBA1C in the last 72 hours. CBG: No results for input(s): GLUCAP in the last 168 hours. Lipid Profile: No results for input(s): CHOL, HDL, LDLCALC, TRIG, CHOLHDL, LDLDIRECT in the last 72 hours. Thyroid Function Tests: No results for input(s): TSH, T4TOTAL, FREET4, T3FREE, THYROIDAB in the last 72 hours. Anemia Panel: No results for input(s): VITAMINB12, FOLATE, FERRITIN, TIBC, IRON, RETICCTPCT in the last 72 hours. Urine analysis:    Component Value Date/Time   BILIRUBINUR n 09/23/2012 1111   PROTEINUR n 09/23/2012 1111   UROBILINOGEN 0.2 09/23/2012 1111   NITRITE n 09/23/2012 1111   LEUKOCYTESUR Negative 09/23/2012 1111     Marchetta Navratil M.D. [facility] 03/08/2018, 10:18 AM  Pager: [phone] Between 7am to 7pm - call Pager - [phone]  After 7pm go to [email or web address] - password [email or web address]  Call night coverage person covering after 7pm

## 2018-03-08 NOTE — Progress Notes (Signed)
Patient instructed on use of flutter.

## 2018-03-08 NOTE — Progress Notes (Signed)
Nutrition Brief Note  RD consulted under COPD protocol. Pt denies any recent loss in appetite or unintentional wt loss. No skin breakdown recorded. Does not wish to have supplementation.   Wt Readings from Last 15 Encounters:  03/07/18 90 kg  02/11/18 89.6 kg  01/31/18 87.3 kg  01/18/18 90.6 kg  12/20/17 86.8 kg  12/04/17 86.8 kg  11/08/17 85.7 kg  10/31/17 84.8 kg  09/18/17 81.2 kg  08/23/17 81.2 kg  04/24/13 91.6 kg  12/17/12 91.2 kg  11/29/12 88.5 kg  11/12/12 88.5 kg  09/23/12 86.2 kg    Body mass index is 35.15 kg/m. Patient meets criteria for obese based on current BMI.   Current diet order is regular, patient is consuming approximately 100% of meals at this time. Labs and medications reviewed.   No nutrition interventions warranted at this time. If nutrition issues arise, please consult RD.   Mariana Single RD, LDN Clinical Nutrition Pager # [phone]

## 2018-03-08 NOTE — Progress Notes (Signed)
MEWS Guidelines - (patients age 66 and over)  Red - At High Risk for Deterioration Yellow - At risk for Deterioration  1. Go to room and assess patient 2. Validate data. Is this patient's baseline? If data confirmed: 3. Is this an acute change? 4. Administer prn meds/treatments as ordered. 5. Note Sepsis score 6. Review goals of care 7. Sports coach, RRT nurse and Provider. 8. Ask Provider to come to bedside.  9. Document patient condition/interventions/response. 10. Increase frequency of vital signs and focused assessments to at least q15 minutes x 4, then q30 minutes x2. - If stable, then q1h x3, then q4h x3 and then q8h or dept. routine. - If unstable, contact Provider & RRT nurse. Prepare for possible transfer. 11. Add entry in progress notes using the smart phrase ".MEWS". 1. Go to room and assess patient 2. Validate data. Is this patient's baseline? If data confirmed: 3. Is this an acute change? 4. Administer prn meds/treatments as ordered? 5. Note Sepsis score 6. Review goals of care 7. Sports coach and Provider 8. Call RRT nurse as needed. 9. Document patient condition/interventions/response. 10. Increase frequency of vital signs and focused assessments to at least q2h x2. - If stable, then q4h x2 and then q8h or dept. routine. - If unstable, contact Provider & RRT nurse. Prepare for possible transfer. 11. Add entry in progress notes using the smart phrase ".MEWS".  Green - Likely stable Lavender - Comfort Care Only  1. Continue routine/ordered monitoring.  2. Review goals of care. 1. Continue routine/ordered monitoring. 2. Review goals of care.   Not an acute status change. Pt tachypnic, ongoing this admission. Pt with no c/o discomfort. No distress noted. Will continue to monitor.

## 2018-03-09 DIAGNOSIS — F419 Anxiety disorder, unspecified: Secondary | ICD-10-CM

## 2018-03-09 DIAGNOSIS — J9601 Acute respiratory failure with hypoxia: Secondary | ICD-10-CM

## 2018-03-09 LAB — BASIC METABOLIC PANEL
ANION GAP: 8 (ref 5–15)
BUN: 14 mg/dL (ref 8–23)
CO2: 25 mmol/L (ref 22–32)
Calcium: 9.9 mg/dL (ref 8.9–10.3)
Chloride: 107 mmol/L (ref 98–111)
Creatinine, Ser: 0.68 mg/dL (ref 0.44–1.00)
GFR calc Af Amer: >60 mL/min (ref >60)
GFR calc non Af Amer: >60 mL/min (ref >60)
Glucose, Bld: 128 mg/dL — ABNORMAL HIGH (ref 70–99)
Potassium: 4.9 mmol/L (ref 3.5–5.1)
Sodium: 140 mmol/L (ref 135–145)

## 2018-03-09 LAB — CBC
HCT: 39.4 % (ref 36.0–46.0)
HEMOGLOBIN: 12 g/dL (ref 12.0–15.0)
MCH: 32.3 pg (ref 26.0–34.0)
MCHC: 30.5 g/dL (ref 30.0–36.0)
MCV: 105.9 fL — ABNORMAL HIGH (ref 80.0–100.0)
Platelets: 275 10*3/uL (ref 150–400)
RBC: 3.72 MIL/uL — ABNORMAL LOW (ref 3.87–5.11)
RDW: 13 % (ref 11.5–15.5)
WBC: 10.7 10*3/uL — ABNORMAL HIGH (ref 4.0–10.5)
nRBC: 0 % (ref 0.0–0.2)

## 2018-03-09 MED ORDER — IPRATROPIUM-ALBUTEROL 0.5-2.5 (3) MG/3ML IN SOLN
3.0000 mL | Freq: Four times a day (QID) | RESPIRATORY_TRACT | Status: DC
  Administered 2018-03-09: 3 mL via RESPIRATORY_TRACT
  Filled 2018-03-09: qty 3

## 2018-03-09 MED ORDER — DOXYCYCLINE HYCLATE 100 MG PO TABS: 100.0000 mg | ORAL_TABLET | Freq: Two times a day (BID) | ORAL | 0 refills | Status: AC

## 2018-03-09 MED ORDER — PREDNISONE 20 MG PO TABS
60.0000 mg | ORAL_TABLET | Freq: Once | ORAL | Status: AC
  Administered 2018-03-09: 60 mg via ORAL
  Filled 2018-03-09: qty 3

## 2018-03-09 MED ORDER — PREDNISONE 10 MG PO TABS: ORAL_TABLET | ORAL | 0 refills | Status: DC

## 2018-03-09 NOTE — Discharge Summary (Signed)
Physician Discharge Summary   Patient ID: ASTHA PROBASCO MRN: [id number] DOB/AGE: 66-Oct-1952 66 y.o.  Admit date: 03/07/2018 Discharge date: 03/09/2018  Primary Care Physician:  Lillian   Recommendations for Outpatient Follow-up:  1. Follow up with PCP in 1-2 weeks 2. Patient recommended to follow-up with her pulmonologist next week 3. Patient did not qualify for home O2 on resting or ambulation  Home Health: PT recommended no PT follow-up Equipment/Devices:   Discharge Condition: stable  CODE STATUS: FULL   Diet recommendation: Heart healthy diet  Discharge Diagnoses:   . Acute respiratory failure with hypoxia ([place]) . COPD with acute exacerbation ([place]) . ADD (attention deficit disorder) . Bipolar disorder, unspecified ([place]) . Tobacco abuse . Hyperlipidemia . Anxiety   Consults: None    Allergies:   Allergies  Allergen Reactions  . Sulfa Antibiotics Other (See Comments)    Mouth gets raw     DISCHARGE MEDICATIONS: Allergies as of 03/09/2018      Reactions   Sulfa Antibiotics Other (See Comments)   Mouth gets raw      Medication List    TAKE these medications   ALPRAZolam 0.25 MG tablet Commonly known as:  XANAX Take 0.25 mg by mouth daily as needed for anxiety.   atorvastatin 20 MG tablet Commonly known as:  LIPITOR Take 20 mg by mouth at bedtime.   bismuth subsalicylate 109 MG chewable tablet Commonly known as:  PEPTO BISMOL Chew 262-524 mg by mouth daily as needed for diarrhea or loose stools.   budesonide-formoterol 160-4.5 MCG/ACT inhaler Commonly known as:  SYMBICORT Take 2 puffs first thing in am and then another 2 puffs about 12 hours later. What changed:    how much to take  how to take this  when to take this  additional instructions   Dexmethylphenidate HCl 40 MG Cp24 Take 40 mg by mouth daily.   doxycycline 100 MG tablet Commonly known as:  VIBRA-TABS Take 1 tablet (100 mg total) by mouth 2 (two) times  daily for 7 days.   famotidine 20 MG tablet Commonly known as:  PEPCID One at bedtime What changed:    how much to take  how to take this  when to take this  additional instructions   ipratropium-albuterol 0.5-2.5 (3) MG/3ML Soln Commonly known as:  DUONEB Take 3 mLs by nebulization every 6 (six) hours as needed. What changed:  reasons to take this   lamoTRIgine 200 MG tablet Commonly known as:  LAMICTAL Take 200 mg by mouth at bedtime.   pantoprazole 40 MG tablet Commonly known as:  PROTONIX Take 1 tablet (40 mg total) by mouth daily. Take 30-60 min before first meal of the day   predniSONE 10 MG tablet Commonly known as:  DELTASONE Prednisone dosing: Take  Prednisone 40mg  (4 tabs) x 3 days, then taper to 30mg  (3 tabs) x 3 days, then 20mg  (2 tabs) x 3days, then 10mg  (1 tab) x 3days, then OFF. Start taking on:  March 10, 2018   [facility] HFA 108 (90 Base) MCG/ACT inhaler Generic drug:  albuterol Inhale 2 puffs into the lungs every 6 (six) hours as needed for wheezing or shortness of breath.   QUEtiapine 50 MG tablet Commonly known as:  SEROQUEL Take 100 mg by mouth at bedtime.   risperiDONE 1 MG tablet Commonly known as:  RISPERDAL Take 3 mg by mouth at bedtime.   Tiotropium Bromide Monohydrate 2.5 MCG/ACT Aers Commonly known as:  SPIRIVA RESPIMAT 2 pffs first  thing when wake up What changed:    how much to take  how to take this  when to take this  additional instructions   venlafaxine XR 150 MG 24 hr capsule Commonly known as:  EFFEXOR-XR Take 150 mg by mouth at bedtime.   Vitamin D (Ergocalciferol) 1.25 MG (50000 UT) Caps capsule Commonly known as:  DRISDOL Take 50,000 Units by mouth every Saturday.        Brief H and P: For complete details please refer to admission H and P, but in brief *Patient is a 68 year old female with COPD, anxiety, bipolar disorder, former smoker, quit in August 2019, ADHD presented with wheezing, coughing and shortness  of breath.  Per patient she was at her baseline until the night before the admission.  She tried to use the rescue inhaler followed by the nebulizer however no significant improvement.  Presented to ED with shortness of breath, mildly hypoxic, tachycardiac and active wheezing.  She was referred for admission.  She was admitted to hospital from 1/23 to 1/25 for COPD exacerbation  Hospital Course:  Acute respiratory failure with hypoxia secondary to COPD with acute exacerbation (HCC) -Much improved the time of my examination. -Transition to oral prednisone, continue oral doxycycline for 1 week, continue albuterol nebs, recommended to continue 3 times a day until follow-up with Dr. Melvyn Novas next week. -Continue Spiriva, rescue inhaler as needed, Symbicort twice daily -Home O2 evaluation was done, patient did not qualify for home O2.    ADD (attention deficit disorder), bipolar disorder -Continue risperidone, seroquel    Tobacco use - quit in August 2019  GERD - Continue PPI     Anxiety  -Continue Xanax   Day of Discharge S: Feels a lot better today, no fevers, wheezing much improved.  Wants to go home.  BP 104/71 (BP Location: Right Arm)   Pulse 99   Temp 98.9 F (37.2 C) (Oral)   Resp 18   Ht 5\' 3"  (1.6 m)   Wt 90 kg   SpO2 94%   BMI 35.15 kg/m   Physical Exam: General: Alert and awake oriented x3 not in any acute distress. HEENT: anicteric sclera, pupils reactive to light and accommodation CVS: S1-S2 clear no murmur rubs or gallops Chest: clear to auscultation bilaterally, no wheezing rales or rhonchi Abdomen: soft nontender, nondistended, normal bowel sounds Extremities: no cyanosis, clubbing or edema noted bilaterally Neuro: Cranial nerves II-XII intact, no focal neurological deficits   The results of significant diagnostics from this hospitalization (including imaging, microbiology, ancillary and laboratory) are listed below for reference.       Procedures/Studies:  Dg Chest 2 View  Result Date: 03/07/2018 CLINICAL DATA:  68 year old female with shortness of breath. EXAM: CHEST - 2 VIEW COMPARISON:  Chest radiograph dated 01/31/2018 FINDINGS: Mild chronic bronchitic changes. No focal consolidation, pleural effusion, or pneumothorax. The cardiac silhouette is within normal limits. No acute osseous pathology. IMPRESSION: No active cardiopulmonary disease. Electronically Signed   By: Anner Crete M.D.   On: 03/07/2018 06:52      LAB RESULTS: Basic Metabolic Panel: Recent Labs  Lab 03/08/18 0327 03/09/18 0413  NA 138 140  K 4.8 4.9  CL 107 107  CO2 25 25  GLUCOSE 136* 128*  BUN 14 14  CREATININE 0.81 0.68  CALCIUM 9.7 9.9   Liver Function Tests: No results for input(s): AST, ALT, ALKPHOS, BILITOT, PROT, ALBUMIN in the last 168 hours. No results for input(s): LIPASE, AMYLASE in the last 168 hours.  No results for input(s): AMMONIA in the last 168 hours. CBC: Recent Labs  Lab 03/08/18 0327 03/09/18 0413  WBC 10.6* 10.7*  HGB 12.4 12.0  HCT 41.5 39.4  MCV 106.1* 105.9*  PLT 286 275   Cardiac Enzymes: No results for input(s): CKTOTAL, CKMB, CKMBINDEX, TROPONINI in the last 168 hours. BNP: Invalid input(s): POCBNP CBG: No results for input(s): GLUCAP in the last 168 hours.    Disposition and Follow-up: Discharge Instructions    Diet - low sodium heart healthy   Complete by:  As directed    Discharge instructions   Complete by:  As directed    Please continue Albuterol nebulizer treatments three times a day till you follow up with Dr Melvyn Novas next week   Increase activity slowly   Complete by:  As directed        DISPOSITION: Home   DISCHARGE FOLLOW-UP Follow-up Information    Tanda Rockers, MD. Schedule an appointment as soon as possible for a visit.   Specialty:  Pulmonary Disease Why:  Please call on Monday for appt on Wednesday or thursday for follow-up Contact information: [address] [place] [address] [phone]            Time coordinating discharge:  35 minutes  Signed:   Estill Cotta M.D. [facility] 03/09/2018, 11:46 AM

## 2018-03-09 NOTE — Progress Notes (Signed)
SATURATION QUALIFICATIONS: (This note is used to comply with regulatory documentation for home oxygen)  Patient Saturations on Room Air at Rest = 94%  Patient Saturations on Room Air while Ambulating = 88-90% Patient Saturations on N/A Liters of oxygen while Ambulating =N/A  Please briefly explain why patient needs home oxygen: Patient does not qualify for home Oxygen

## 2018-03-09 NOTE — Progress Notes (Signed)
Patient discharged to home with family, discharge instructions reviewed with patient who verbalize understanding. New RX's given to patient.

## 2018-03-11 NOTE — Progress Notes (Signed)
lmtcb

## 2018-03-12 NOTE — Progress Notes (Signed)
LMTCB

## 2018-03-13 NOTE — Progress Notes (Addendum)
@Patient  ID: Julia Daniels, female    DOB: [birth year], 68 y.o.   MRN: [id number]  Chief Complaint  Patient presents with  . 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  Patient with MS phenotype for alpha-1.  COPD Gold 3 criteria.  Patient is a frequent exacerbate her.  Four COPD exacerbations of last 3 months (December/2019 till end of February/2020), 3 of the 4 exacerbations requiring hospitalization.  Most recent hospitalization being February/27/2020- February/29/2020.  PMH: Obesity, anxiety, bipolar disorder, ADHD Smoker/ Smoking History: Former smoker.  Quit 08/2017.  40-pack-year smoking history. Maintenance: Symbicort 160, Spiriva 2.5 Pt of: Dr. Melvyn Novas  03/14/2018  - Visit   68 year old female former smoker (quit August/2019) followed in our office for COPD Gold 3 with significant asthmatic component.  Patient also with peripheral eosinophilia in December/2019 blood work.  Patient is had recurrent exacerbations over the last 6 months.  Over the last 3 months she is had 4 exacerbations 3 of which requiring hospitalization.  She presents today for hospital follow-up.  Patient is currently maintained on Symbicort 160, Spiriva Respimat 2.5, doxycycline as well as a prednisone taper from the hospital.  The patient reports that she feels better but is still short of breath.  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  She is concerned that she may not be able to complete this shift.  She is wondering if she can be taken out of work tonight based off of my exam.  MMRC - Breathlessness Score 4 - I am too breathless to leave the house or I am breathlessness when dressing    Tests:   12/31/2017-CBC with differential- eosinophils relative 8, eosinophils  absolute 0.9  01/05/2019-chest x-ray- no active cardiopulmonary disease, chronic bronchitic changes  08/23/2017-CT angio chest- no PE, or acute chest pathology, mild basilar atelectasis or scarring  03/06/2018-pulmonary function test- FVC 1.37 (45% predicted), postbronchodilator ratio 66, postbronchodilator FEV1 1.02 (43% predicted), significant bronchodilator response, significant mid flow reversibility after bronchodilator administration, DLCO 59 >>>Severe obstructive airways disease with reversible component, moderate diffusion defect  10/31/2017-alpha-1 antitrypsin phenotype PI MS, level: 121   FENO:  No results found for: NITRICOXIDE  PFT: PFT Results Latest Ref Rng & Units 03/06/2018  FVC-Pre L 1.37  FVC-Predicted Pre % 45  FVC-Post L 1.63  FVC-Predicted Post % 53  Pre FEV1/FVC % % 58  Post FEV1/FCV % % 62  FEV1-Pre L 0.80  FEV1-Predicted Pre % 34  FEV1-Post L 1.02  DLCO UNC% % 59  DLCO COR %Predicted % 97  TLC L 4.42  TLC % Predicted % 90  RV % Predicted % 139    Imaging: Dg Chest 2 View  Result Date: 03/07/2018 CLINICAL DATA:  68 year old female with shortness of breath. EXAM: CHEST - 2 VIEW COMPARISON:  Chest radiograph dated 01/31/2018 FINDINGS: Mild chronic bronchitic changes. No focal consolidation, pleural effusion, or pneumothorax. The cardiac silhouette is within normal limits. No acute osseous pathology. IMPRESSION: No active cardiopulmonary disease. Electronically Signed   By: Anner Crete M.D.   On: 03/07/2018 06:52      Specialty Problems      Pulmonary Problems   COPD  GOLD ? III with increased Eos and reversibility ? ACOS ?     Quit  smoking 08/2017  Spirometry 10/31/2017  FEV1 0.7 (30%)  Ratio 51 p Breo in am   - 10/31/2017  Eos = 0.9  - alpha one AT screen 10/31/2017   MS level 121  - 10/31/2017   Walked RA x one lap @ 185 stopped due to sob s desats at fast pace   - 01/18/2018   Walked RA  2 laps @ 272ft each @ fast  pace  stopped due to sob no  desat  - Spirometry 01/18/2018  FEV1 1.1 (49%)  Ratio 63 with mild curvature after am spiriva and symbicort   - 02/11/2018  After extensive coaching inhaler device,  effectiveness =    75% short Ti and added prednisone as plan D / changed spiriva to smi - PFT's 03/06/18  FEV1 1.02 (43 % ) ratio 0.62  p 27 % improvement from saba p nothing prior to study with DLCO  59/63 % corrects to 77  % for alv volume  With ERV 65       Acute respiratory failure with hypoxia and hypercapnia (HCC)   COPD with acute exacerbation (HCC)   COPD exacerbation (HCC)   Hypoxia   Acute on chronic respiratory failure (HCC)   Respiratory failure, acute-on-chronic (HCC)   Acute on chronic respiratory failure with hypoxia (HCC)   Acute respiratory failure with hypoxia (HCC)   Allergic rhinitis      Allergies  Allergen Reactions  . Sulfa Antibiotics Other (See Comments)    Mouth gets raw    Immunization History  Administered Date(s) Administered  . Influenza Whole 09/09/2017  . Influenza,inj,Quad PF,6+ Mos 09/23/2012  . Pneumococcal Polysaccharide-23 02/02/2018  . Tdap 02/07/2013    Past Medical History:  Diagnosis Date  . Anxiety   . Asthma   . COPD (chronic obstructive pulmonary disease) ([place])   . Depression     Tobacco History: Social History   Tobacco Use  Smoking Status Former Smoker  . Packs/day: 1.00  . Years: 40.00  . Pack years: 40.00  . Types: Cigarettes  . Last attempt to quit: 08/23/2017  . Years since quitting: 0.5  Smokeless Tobacco Never Used   Counseling given: Yes  Continue to not smoke  Outpatient Encounter Medications as of 03/14/2018  Medication Sig  . albuterol (PROAIR HFA) 108 (90 Base) MCG/ACT inhaler Inhale 2 puffs into the lungs every 6 (six) hours as needed for wheezing or shortness of breath.  . ALPRAZolam (XANAX) 0.25 MG tablet Take 0.25 mg by mouth daily as needed for anxiety.   Marland Kitchen atorvastatin (LIPITOR) 20 MG tablet Take 20 mg by mouth at bedtime.  . bismuth  subsalicylate (PEPTO BISMOL) 262 MG chewable tablet Chew 262-524 mg by mouth daily as needed for diarrhea or loose stools.  . budesonide-formoterol (SYMBICORT) 160-4.5 MCG/ACT inhaler Take 2 puffs first thing in am and then another 2 puffs about 12 hours later. (Patient taking differently: Inhale 2 puffs into the lungs 2 (two) times daily. )  . Dexmethylphenidate HCl 40 MG CP24 Take 40 mg by mouth daily.   Marland Kitchen doxycycline (VIBRA-TABS) 100 MG tablet Take 1 tablet (100 mg total) by mouth 2 (two) times daily for 7 days.  . famotidine (PEPCID) 20 MG tablet One at bedtime (Patient taking differently: Take 20 mg by mouth at bedtime. )  . ipratropium-albuterol (DUONEB) 0.5-2.5 (3) MG/3ML SOLN Take 3 mLs by nebulization every 6 (six) hours as needed. (Patient taking differently: Take 3 mLs by nebulization every 6 (six) hours as needed (  shortness of breath). )  . lamoTRIgine (LAMICTAL) 200 MG tablet Take 200 mg by mouth at bedtime.   . pantoprazole (PROTONIX) 40 MG tablet Take 1 tablet (40 mg total) by mouth daily. Take 30-60 min before first meal of the day  . predniSONE (DELTASONE) 10 MG tablet Prednisone dosing: Take  Prednisone 40mg  (4 tabs) x 3 days, then taper to 30mg  (3 tabs) x 3 days, then 20mg  (2 tabs) x 3days, then 10mg  (1 tab) x 3days, then OFF.  Marland Kitchen QUEtiapine (SEROQUEL) 50 MG tablet Take 100 mg by mouth at bedtime.  . risperiDONE (RISPERDAL) 1 MG tablet Take 3 mg by mouth at bedtime.   . Tiotropium Bromide Monohydrate (SPIRIVA RESPIMAT) 2.5 MCG/ACT AERS 2 pffs first thing when wake up (Patient taking differently: Inhale 2 puffs into the lungs daily. )  . venlafaxine XR (EFFEXOR-XR) 150 MG 24 hr capsule Take 150 mg by mouth at bedtime.   . Vitamin D, Ergocalciferol, (DRISDOL) 50000 units CAPS capsule Take 50,000 Units by mouth every Saturday.   . metroNIDAZOLE (FLAGYL) 500 MG tablet   . montelukast (SINGULAIR) 10 MG tablet Take 1 tablet (10 mg total) by mouth at bedtime.  Marland Kitchen Spacer/Aero-Holding  Chambers (AEROCHAMBER MV) inhaler Use as instructed   No facility-administered encounter medications on file as of 03/14/2018.      Review of Systems  Review of Systems  Constitutional: Positive for fatigue. Negative for chills, fever and unexpected weight change.  HENT: Positive for postnasal drip. Negative for congestion, ear pain, sinus pressure and sinus pain.   Respiratory: Positive for cough and shortness of breath. Negative for chest tightness and wheezing.   Cardiovascular: Negative for chest pain and palpitations.  Gastrointestinal: Negative for diarrhea, nausea and vomiting.  Musculoskeletal: Negative for arthralgias.  Skin: Negative for color change.  Allergic/Immunologic: Negative for environmental allergies and food allergies.       Pt denies seasonal allergies   Neurological: Negative for dizziness, light-headedness and headaches.  Psychiatric/Behavioral: Negative for dysphoric mood. The patient is nervous/anxious.   All other systems reviewed and are negative.    Physical Exam  BP (!) 144/80 (BP Location: Left Arm, Patient Position: Sitting, Cuff Size: Normal)   Pulse 90   Temp 98.3 F (36.8 C)   Ht 5\' 3"  (1.6 m)   Wt 201 lb 6.4 oz (91.4 kg)   SpO2 97%   BMI 35.68 kg/m   Wt Readings from Last 5 Encounters:  03/14/18 201 lb 6.4 oz (91.4 kg)  03/07/18 198 lb 6.6 oz (90 kg)  02/11/18 197 lb 9.6 oz (89.6 kg)  01/31/18 192 lb 7.4 oz (87.3 kg)  01/18/18 199 lb 12.8 oz (90.6 kg)     Physical Exam  Constitutional: She is oriented to person, place, and time and well-developed, well-nourished, and in no distress. No distress.  Chronically ill obese female  HENT:  Head: Normocephalic and atraumatic.  Right Ear: Hearing, external ear and ear canal normal.  Left Ear: Hearing, external ear and ear canal normal.  Nose: Mucosal edema and rhinorrhea present. Right sinus exhibits no maxillary sinus tenderness and no frontal sinus tenderness. Left sinus exhibits no  maxillary sinus tenderness and no frontal sinus tenderness.  Mouth/Throat: Uvula is midline and oropharynx is clear and moist. She has dentures. No oropharyngeal exudate.  +TMs with effusion without infection  +Postnasal drip  Eyes: Pupils are equal, round, and reactive to light.  Neck: Normal range of motion. Neck supple.  Cardiovascular: Normal rate, regular rhythm and  normal heart sounds.  Pulmonary/Chest: Effort normal and breath sounds normal. No accessory muscle usage. No respiratory distress. She has no decreased breath sounds. She has no wheezes. She has no rhonchi. She has no rales.  Abdominal: Soft. Bowel sounds are normal. She exhibits no distension. There is no abdominal tenderness.  Obese abdomen  Musculoskeletal: Normal range of motion.        General: Edema (Trace lower extremity edema) present.  Lymphadenopathy:    She has no cervical adenopathy.  Neurological: She is alert and oriented to person, place, and time. Gait normal.  Skin: Skin is warm and dry. She is not diaphoretic. No erythema.  Psychiatric: Memory, affect and judgment normal. Her mood appears anxious.  Nursing note and vitals reviewed.     Lab Results:  CBC    Component Value Date/Time   WBC 10.7 (H) 03/09/2018 0413   RBC 3.72 (L) 03/09/2018 0413   HGB 12.0 03/09/2018 0413   HCT 39.4 03/09/2018 0413   PLT 275 03/09/2018 0413   MCV 105.9 (H) 03/09/2018 0413   MCH 32.3 03/09/2018 0413   MCHC 30.5 03/09/2018 0413   RDW 13.0 03/09/2018 0413   LYMPHSABS 1.4 12/31/2017 1317   MONOABS 0.5 12/31/2017 1317   EOSABS 0.9 (H) 12/31/2017 1317   BASOSABS 0.1 12/31/2017 1317    BMET    Component Value Date/Time   NA 140 03/09/2018 0413   K 4.9 03/09/2018 0413   CL 107 03/09/2018 0413   CO2 25 03/09/2018 0413   GLUCOSE 128 (H) 03/09/2018 0413   BUN 14 03/09/2018 0413   CREATININE 0.68 03/09/2018 0413   CALCIUM 9.9 03/09/2018 0413   GFRNONAA >60 03/09/2018 0413   GFRAA >60 03/09/2018 0413    BNP     Component Value Date/Time   BNP 50.1 08/23/2017 1206    ProBNP No results found for: PROBNP    Assessment & Plan:     COPD  GOLD ? III with increased Eos and reversibility ? ACOS ?  Assessment: October and December/2019 show peripheral eosinophilia - 900 February/2020 pulmonary function test showing COPD Gold 3 with bronchodilator response February/2020 DLCO 59 February/2020 PFT showing restriction BMI 35.68 Patient reports adherence to Symbicort 160 as well as Spiriva Respimat 2.5 Patient reports adherence to doxycycline and prednisone taper from hospital discharge mMRC 4 today Walk in office today patient was able to complete without any oxygen desaturations Lungs clear to auscultation today Mild AR flare on exam today  Plan: Continue doxycycline and prednisone taper as prescribed from hospital Continue Symbicort 160, spacer provided today Continue Spiriva Respimat 2.5 Start Singulair daily Start daily antihistamine Can also do nasal saline rinses May need to consider biologic therapy to help with recurrent exacerbations based off of eosinophilia and asthmatic component on pulmonary function test Follow-up in 1 week I am okay providing a letter to write patient out of work for 03/14/2018 night shift  Acute on chronic respiratory failure with hypoxia [facility]) Assessment: February/2020 pulmonary function test showing DLCO of 59 BMI 35.68  Plan: Walk in office today >>>Patient was able to complete without any oxygen desaturations  Tobacco abuse Assessment: 40-pack-year smoking history Stopped smoking August/2019 Last CT was in August/2019 CT Angio  Plan: Can consider lung cancer screening in the future when patient is at a stable standpoint with her breathing We will discuss with Dr. Melvyn Novas and defer to his decision on lung cancer screening at this time  Allergic rhinitis Assessment: Mild AR flare on exam today  Plan: Start singular daily Start daily  antihistamine Can do nasal saline rinses as needed for nasal congestion Continue prednisone as prescribed from the hospital discharge  Addendum: 03/15/2018 Could consider diagnosis of chronic obstructive asthma with eosinophilia to see if patient could qualify for biologic therapies to help with reducing exacerbations >>> We will first see how patient is doing managed on Singulair as well as daily antihistamine which was added at 03/14/2018 office visit   Lauraine Rinne, NP 03/14/2018   This appointment was 42 minutes long with over 50% of the time in direct face-to-face patient care, assessment, plan of care, and follow-up.

## 2018-03-14 ENCOUNTER — Encounter: Payer: Self-pay | Admitting: Pulmonary Disease

## 2018-03-14 ENCOUNTER — Ambulatory Visit: Payer: BLUE CROSS/BLUE SHIELD | Admitting: Pulmonary Disease

## 2018-03-14 VITALS — BP 144/80 | HR 90 | Temp 98.3°F | Ht 63.0 in | Wt 201.4 lb

## 2018-03-14 DIAGNOSIS — Z720 Tobacco use: Secondary | ICD-10-CM

## 2018-03-14 DIAGNOSIS — J449 Chronic obstructive pulmonary disease, unspecified: Secondary | ICD-10-CM | POA: Diagnosis not present

## 2018-03-14 DIAGNOSIS — J309 Allergic rhinitis, unspecified: Secondary | ICD-10-CM | POA: Diagnosis not present

## 2018-03-14 DIAGNOSIS — J9621 Acute and chronic respiratory failure with hypoxia: Secondary | ICD-10-CM

## 2018-03-14 MED ORDER — AEROCHAMBER MV MISC: 0 refills | Status: DC

## 2018-03-14 MED ORDER — MONTELUKAST SODIUM 10 MG PO TABS: 10.0000 mg | ORAL_TABLET | Freq: Every day | ORAL | 11 refills | Status: DC

## 2018-03-14 NOTE — Assessment & Plan Note (Signed)
Assessment: February/2020 pulmonary function test showing DLCO of 59 BMI 35.68  Plan: Walk in office today >>>Patient was able to complete without any oxygen desaturations

## 2018-03-14 NOTE — Patient Instructions (Addendum)
Continue Symbicort 160 >>> 2 puffs in the morning right when you wake up, rinse out your mouth after use, 12 hours later 2 puffs, rinse after use >>> Take this daily, no matter what >>> This is not a rescue inhaler  >>>use spacer given today in office  Spiriva Respimat 2.5 >>> 2 puffs daily >>> Do this every day >>>This is not a rescue inhaler  Start singulair 10mg  daily   Please start taking a daily antihistamine:  >>>choose one of: zyrtec, claritin, allegra, or xyzal  >>>these are over the counter medications  >>>can choose generic option  >>>take daily  >>>this medication helps with allergies, post nasal drip, and cough   Walk in office today to assess oxygen   Follow up in 1 week with Dr. Melvyn Novas, if not availability or Wyn Quaker FNP   It is flu season:   >>> Best ways to protect herself from the flu: Receive the yearly flu vaccine, practice good hand hygiene washing with soap and also using hand sanitizer when available, eat a nutritious meals, get adequate rest, hydrate appropriately   Please contact the office if your symptoms worsen or you have concerns that you are not improving.   Thank you for choosing [facility] for your healthcare, and for allowing [place] to partner with you on your healthcare journey. I am thankful to be able to provide care to you today.   Wyn Quaker FNP-C

## 2018-03-14 NOTE — Assessment & Plan Note (Signed)
Assessment: Mild AR flare on exam today  Plan: Start singular daily Start daily antihistamine Can do nasal saline rinses as needed for nasal congestion Continue prednisone as prescribed from the hospital discharge

## 2018-03-14 NOTE — Progress Notes (Signed)
Pt had ov today and her PFT was reviewed and singulair started

## 2018-03-14 NOTE — Assessment & Plan Note (Addendum)
Assessment: October and December/2019 show peripheral eosinophilia - 900 February/2020 pulmonary function test showing COPD Gold 3 with bronchodilator response February/2020 DLCO 59 February/2020 PFT showing restriction BMI 35.68 Patient reports adherence to Symbicort 160 as well as Spiriva Respimat 2.5 Patient reports adherence to doxycycline and prednisone taper from hospital discharge mMRC 4 today Walk in office today patient was able to complete without any oxygen desaturations Lungs clear to auscultation today Mild AR flare on exam today  Plan: Continue doxycycline and prednisone taper as prescribed from hospital Continue Symbicort 160, spacer provided today Continue Spiriva Respimat 2.5 Start Singulair daily Start daily antihistamine Can also do nasal saline rinses May need to consider biologic therapy to help with recurrent exacerbations based off of eosinophilia and asthmatic component on pulmonary function test Follow-up in 1 week >>> Can consider referral to pulmonary rehab at that office visit as patient is deconditioned I am okay providing a letter to write patient out of work for 03/14/2018 night shift

## 2018-03-14 NOTE — Assessment & Plan Note (Signed)
Assessment: 40-pack-year smoking history Stopped smoking August/2019 Last CT was in August/2019 CT Angio  Plan: Can consider lung cancer screening in the future when patient is at a stable standpoint with her breathing We will discuss with Dr. Melvyn Novas and defer to his decision on lung cancer screening at this time

## 2018-03-18 NOTE — Progress Notes (Signed)
Chart and office note reviewed in detail  > agree with a/p as outlined    

## 2018-03-20 NOTE — Progress Notes (Deleted)
@Patient  ID: Julia Daniels, female    DOB: May 30, 1950, 68 y.o.   MRN: [id number]  No chief complaint on file.   Referring provider: [facility]  HPI:  68 year old female former smoker followed in our office for Asthma COPD Overlap Syndrome.  Patient with MS phenotype for alpha-1.  COPD Gold 3 criteria.  Patient is a frequent exacerbate her.  Four COPD exacerbations of last 3 months (December/2019 till end of February/2020), 3 of the 4 exacerbations requiring hospitalization.  Most recent hospitalization being February/27/2020- February/29/2020.  PMH: Obesity, anxiety, bipolar disorder, ADHD Smoker/ Smoking History: Former smoker.  Quit 08/2017.  40-pack-year smoking history. Maintenance: Symbicort 160, Spiriva 2.5 Pt of: Dr. Melvyn Novas  03/20/2018  - Visit   HPI  Tests:   12/31/2017-CBC with differential- eosinophils relative 8, eosinophils absolute 0.9  01/05/2019-chest x-ray- no active cardiopulmonary disease, chronic bronchitic changes  08/23/2017-CT angio chest- no PE, or acute chest pathology, mild basilar atelectasis or scarring  03/06/2018-pulmonary function test- FVC 1.37 (45% predicted), postbronchodilator ratio 66, postbronchodilator FEV1 1.02 (43% predicted), significant bronchodilator response, significant mid flow reversibility after bronchodilator administration, DLCO 59 >>>Severe obstructive airways disease with reversible component, moderate diffusion defect  10/31/2017-alpha-1 antitrypsin phenotype PI MS, level: 121  FENO:  No results found for: NITRICOXIDE  PFT: PFT Results Latest Ref Rng & Units 03/06/2018  FVC-Pre L 1.37  FVC-Predicted Pre % 45  FVC-Post L 1.63  FVC-Predicted Post % 53  Pre FEV1/FVC % % 58  Post FEV1/FCV % % 62  FEV1-Pre L 0.80  FEV1-Predicted Pre % 34  FEV1-Post L 1.02  DLCO UNC% % 59  DLCO COR %Predicted % 97  TLC L 4.42  TLC % Predicted % 90  RV % Predicted % 139    Imaging: Dg Chest 2 View  Result Date:  03/07/2018 CLINICAL DATA:  68 year old female with shortness of breath. EXAM: CHEST - 2 VIEW COMPARISON:  Chest radiograph dated 01/31/2018 FINDINGS: Mild chronic bronchitic changes. No focal consolidation, pleural effusion, or pneumothorax. The cardiac silhouette is within normal limits. No acute osseous pathology. IMPRESSION: No active cardiopulmonary disease. Electronically Signed   By: Anner Crete M.D.   On: 03/07/2018 06:52      Specialty Problems      Pulmonary Problems   COPD  GOLD ? III with increased Eos and reversibility ? ACOS ?     Quit smoking 08/2017  Spirometry 10/31/2017  FEV1 0.7 (30%)  Ratio 51 p Breo in am   - 10/31/2017  Eos = 0.9  - alpha one AT screen 10/31/2017   MS level 121  - 10/31/2017   Walked RA x one lap @ 185 stopped due to sob s desats at fast pace   - 01/18/2018   Walked RA  2 laps @ 246ft each @ fast  pace  stopped due to sob no desat  - Spirometry 01/18/2018  FEV1 1.1 (49%)  Ratio 63 with mild curvature after am spiriva and symbicort   - 02/11/2018  After extensive coaching inhaler device,  effectiveness =    75% short Ti and added prednisone as plan D / changed spiriva to smi - PFT's 03/06/18  FEV1 1.02 (43 % ) ratio 0.62  p 27 % improvement from saba p nothing prior to study with DLCO  59/63 % corrects to 77  % for alv volume  With ERV 65       Acute respiratory failure with hypoxia and hypercapnia (HCC)   COPD with acute  exacerbation (HCC)   COPD exacerbation (HCC)   Hypoxia   Acute on chronic respiratory failure (HCC)   Respiratory failure, acute-on-chronic (HCC)   Acute on chronic respiratory failure with hypoxia (HCC)   Acute respiratory failure with hypoxia (HCC)   Allergic rhinitis      Allergies  Allergen Reactions  . Sulfa Antibiotics Other (See Comments)    Mouth gets raw    Immunization History  Administered Date(s) Administered  . Influenza Whole 09/09/2017  . Influenza,inj,Quad PF,6+ Mos 09/23/2012  . Pneumococcal  Polysaccharide-23 02/02/2018  . Tdap 02/07/2013    Past Medical History:  Diagnosis Date  . Anxiety   . Asthma   . COPD (chronic obstructive pulmonary disease) ([place])   . Depression     Tobacco History: Social History   Tobacco Use  Smoking Status Former Smoker  . Packs/day: 1.00  . Years: 40.00  . Pack years: 40.00  . Types: Cigarettes  . Last attempt to quit: 08/23/2017  . Years since quitting: 0.5  Smokeless Tobacco Never Used   Counseling given: Not Answered   Outpatient Encounter Medications as of 03/21/2018  Medication Sig  . albuterol (PROAIR HFA) 108 (90 Base) MCG/ACT inhaler Inhale 2 puffs into the lungs every 6 (six) hours as needed for wheezing or shortness of breath.  . ALPRAZolam (XANAX) 0.25 MG tablet Take 0.25 mg by mouth daily as needed for anxiety.   Marland Kitchen atorvastatin (LIPITOR) 20 MG tablet Take 20 mg by mouth at bedtime.  . bismuth subsalicylate (PEPTO BISMOL) 262 MG chewable tablet Chew 262-524 mg by mouth daily as needed for diarrhea or loose stools.  . budesonide-formoterol (SYMBICORT) 160-4.5 MCG/ACT inhaler Take 2 puffs first thing in am and then another 2 puffs about 12 hours later. (Patient taking differently: Inhale 2 puffs into the lungs 2 (two) times daily. )  . Dexmethylphenidate HCl 40 MG CP24 Take 40 mg by mouth daily.   . famotidine (PEPCID) 20 MG tablet One at bedtime (Patient taking differently: Take 20 mg by mouth at bedtime. )  . ipratropium-albuterol (DUONEB) 0.5-2.5 (3) MG/3ML SOLN Take 3 mLs by nebulization every 6 (six) hours as needed. (Patient taking differently: Take 3 mLs by nebulization every 6 (six) hours as needed (shortness of breath). )  . lamoTRIgine (LAMICTAL) 200 MG tablet Take 200 mg by mouth at bedtime.   . metroNIDAZOLE (FLAGYL) 500 MG tablet   . montelukast (SINGULAIR) 10 MG tablet Take 1 tablet (10 mg total) by mouth at bedtime.  . pantoprazole (PROTONIX) 40 MG tablet Take 1 tablet (40 mg total) by mouth daily. Take 30-60 min  before first meal of the day  . predniSONE (DELTASONE) 10 MG tablet Prednisone dosing: Take  Prednisone 40mg  (4 tabs) x 3 days, then taper to 30mg  (3 tabs) x 3 days, then 20mg  (2 tabs) x 3days, then 10mg  (1 tab) x 3days, then OFF.  Marland Kitchen QUEtiapine (SEROQUEL) 50 MG tablet Take 100 mg by mouth at bedtime.  . risperiDONE (RISPERDAL) 1 MG tablet Take 3 mg by mouth at bedtime.   Marland Kitchen Spacer/Aero-Holding Chambers (AEROCHAMBER MV) inhaler Use as instructed  . Tiotropium Bromide Monohydrate (SPIRIVA RESPIMAT) 2.5 MCG/ACT AERS 2 pffs first thing when wake up (Patient taking differently: Inhale 2 puffs into the lungs daily. )  . venlafaxine XR (EFFEXOR-XR) 150 MG 24 hr capsule Take 150 mg by mouth at bedtime.   . Vitamin D, Ergocalciferol, (DRISDOL) 50000 units CAPS capsule Take 50,000 Units by mouth every Saturday.  No facility-administered encounter medications on file as of 03/21/2018.      Review of Systems  Review of Systems   Physical Exam  There were no vitals taken for this visit.  Wt Readings from Last 5 Encounters:  03/14/18 201 lb 6.4 oz (91.4 kg)  03/07/18 198 lb 6.6 oz (90 kg)  02/11/18 197 lb 9.6 oz (89.6 kg)  01/31/18 192 lb 7.4 oz (87.3 kg)  01/18/18 199 lb 12.8 oz (90.6 kg)     Physical Exam    Lab Results:  CBC    Component Value Date/Time   WBC 10.7 (H) 03/09/2018 0413   RBC 3.72 (L) 03/09/2018 0413   HGB 12.0 03/09/2018 0413   HCT 39.4 03/09/2018 0413   PLT 275 03/09/2018 0413   MCV 105.9 (H) 03/09/2018 0413   MCH 32.3 03/09/2018 0413   MCHC 30.5 03/09/2018 0413   RDW 13.0 03/09/2018 0413   LYMPHSABS 1.4 12/31/2017 1317   MONOABS 0.5 12/31/2017 1317   EOSABS 0.9 (H) 12/31/2017 1317   BASOSABS 0.1 12/31/2017 1317    BMET    Component Value Date/Time   NA 140 03/09/2018 0413   K 4.9 03/09/2018 0413   CL 107 03/09/2018 0413   CO2 25 03/09/2018 0413   GLUCOSE 128 (H) 03/09/2018 0413   BUN 14 03/09/2018 0413   CREATININE 0.68 03/09/2018 0413   CALCIUM  9.9 03/09/2018 0413   GFRNONAA >60 03/09/2018 0413   GFRAA >60 03/09/2018 0413    BNP    Component Value Date/Time   BNP 50.1 08/23/2017 1206    ProBNP No results found for: PROBNP    Assessment & Plan:     No problem-specific Assessment & Plan notes found for this encounter.     Lauraine Rinne, NP 03/20/2018   This appointment was *** with over 50% of the time in direct face-to-face patient care, assessment, plan of care, and follow-up.

## 2018-03-21 ENCOUNTER — Ambulatory Visit: Payer: Self-pay | Admitting: Pulmonary Disease

## 2018-03-25 ENCOUNTER — Ambulatory Visit: Payer: Self-pay | Admitting: Pulmonary Disease

## 2018-03-27 ENCOUNTER — Ambulatory Visit: Payer: BLUE CROSS/BLUE SHIELD | Admitting: Pulmonary Disease

## 2018-03-27 ENCOUNTER — Other Ambulatory Visit: Payer: Self-pay

## 2018-03-27 ENCOUNTER — Encounter: Payer: Self-pay | Admitting: Pulmonary Disease

## 2018-03-27 ENCOUNTER — Other Ambulatory Visit: Payer: Self-pay | Admitting: Nurse Practitioner

## 2018-03-27 VITALS — BP 126/74 | HR 81 | Temp 98.7°F | Ht 63.0 in | Wt 201.0 lb

## 2018-03-27 DIAGNOSIS — J309 Allergic rhinitis, unspecified: Secondary | ICD-10-CM

## 2018-03-27 DIAGNOSIS — Z1231 Encounter for screening mammogram for malignant neoplasm of breast: Secondary | ICD-10-CM

## 2018-03-27 DIAGNOSIS — J449 Chronic obstructive pulmonary disease, unspecified: Secondary | ICD-10-CM

## 2018-03-27 MED ORDER — PREDNISONE 10 MG PO TABS: ORAL_TABLET | ORAL | 0 refills | Status: DC

## 2018-03-27 NOTE — Patient Instructions (Addendum)
Continue Symbicort 160 >>> 2 puffs in the morning right when you wake up, rinse out your mouth after use, 12 hours later 2 puffs, rinse after use >>> Take this daily, no matter what >>> This is not a rescue inhaler  >>>use spacer given today in office  Continue Spiriva Respimat 2.5 >>> 2 puffs daily >>> Do this every day >>>This is not a rescue inhaler   Only use your albuterol as a rescue medication to be used if you can't catch your breath by resting or doing a relaxed purse lip breathing pattern.  - The less you use it, the better it will work when you need it. - Ok to use up to 2 puffs  every 4 hours if you must but call for immediate appointment if use goes up over your usual need - Don't leave home without it !!  (think of it like the spare tire for your car)    Albuterol nebulizer can be used every 4-6 hours as needed   Continue singulair 10mg  daily  Continue Claritin daily   Please start taking chlorpheniramine (aka Chlor tabs) 4 mg tablet (1 to 2 tablets at night) for management of allergies and postnasal drip at night >>> This is an over-the-counter medication >>> This medication is sedating  I have refilled your prednisone which you can start taking if you start having acute symptoms >>>4 tabs for 3 days, then 3 tabs for 3 days, 2 tabs for 3 days, then 1 tab for 3 days, then stop    Coronavirus (COVID-19) Are you at risk?  Are you at risk for the Coronavirus (COVID-19)?  To be considered HIGH RISK for Coronavirus (COVID-19), you have to meet the following criteria:  . Traveled to [place], [place], [place], [place] or [place]; or in the [place] to [place], [place], [place], or [place]; and have fever, cough, and shortness of breath within the last 2 weeks of travel OR . Been in close contact with a person diagnosed with COVID-19 within the last 2 weeks and have fever, cough, and shortness of breath . IF YOU DO NOT MEET THESE CRITERIA, YOU ARE CONSIDERED  LOW RISK FOR COVID-19.  What to do if you are HIGH RISK for COVID-19?  Marland Kitchen If you are having a medical emergency, call 911. . Seek medical care right away. Before you go to a doctor's office, urgent care or emergency department, call ahead and tell them about your recent travel, contact with someone diagnosed with COVID-19, and your symptoms. You should receive instructions from your physician's office regarding next steps of care.  . When you arrive at healthcare provider, tell the healthcare staff immediately you have returned from visiting [place], [place], [place], [place] or [place]; or traveled in the [place] to [place], [place], [place], or [place]; in the last two weeks or you have been in close contact with a person diagnosed with COVID-19 in the last 2 weeks.   . Tell the health care staff about your symptoms: fever, cough and shortness of breath. . After you have been seen by a medical provider, you will be either: o Tested for (COVID-19) and discharged home on quarantine except to seek medical care if symptoms worsen, and asked to  - Stay home and avoid contact with others until you get your results (4-5 days)  - Avoid travel on public transportation if possible (such as bus, train, or airplane) or o Sent to the Emergency Department  by EMS for evaluation, COVID-19 testing, and possible admission depending on your condition and test results.  What to do if you are LOW RISK for COVID-19?  Reduce your risk of any infection by using the same precautions used for avoiding the common cold or flu:  Marland Kitchen Wash your hands often with soap and warm water for at least 20 seconds.  If soap and water are not readily available, use an alcohol-based hand sanitizer with at least 60% alcohol.  . If coughing or sneezing, cover your mouth and nose by coughing or sneezing into the elbow areas of your shirt or coat, into a tissue or into your sleeve (not your hands). . Avoid shaking hands with others  and consider head nods or verbal greetings only. . Avoid touching your eyes, nose, or mouth with unwashed hands.  . Avoid close contact with people who are sick. . Avoid places or events with large numbers of people in one location, like concerts or sporting events. . Carefully consider travel plans you have or are making. . If you are planning any travel outside or inside the [place], visit the CDC's Travelers' Health webpage for the latest health notices. . If you have some symptoms but not all symptoms, continue to monitor at home and seek medical attention if your symptoms worsen. . If you are having a medical emergency, call 911.   [place] / e-Visit: [email or web address]         [facility]: [phone]  [facility]: [phone]                   [facility]: [phone]       Follow up with Dr. Melvyn Novas in 4-6 weeks, or Wyn Quaker FNP if no availability      It is flu season:   >>> Best ways to protect herself from the flu: Receive the yearly flu vaccine, practice good hand hygiene washing with soap and also using hand sanitizer when available, eat a nutritious meals, get adequate rest, hydrate appropriately   Please contact the office if your symptoms worsen or you have concerns that you are not improving.   Thank you for choosing [facility] for your healthcare, and for allowing [place] to partner with you on your healthcare journey. I am thankful to be able to provide care to you today.   Wyn Quaker FNP-C

## 2018-03-27 NOTE — Assessment & Plan Note (Signed)
Assessment: Improved air symptoms on exam today  Plan: Continue Singulair daily Continue Claritin daily Could consider starting chlor tabs if postnasal drip worsens Consider RAST panel with IgE at next office visit if patient remains off of prednisone

## 2018-03-27 NOTE — Assessment & Plan Note (Signed)
Assessment: October and December/2019 show peripheral eosinophilia - 900 February/2020 pulmonary function test showing COPD Gold 3 with bronchodilator response February/2020 DLCO 59 February/2020 PFT showing restriction BMI 35.68 Patient reports adherence to Symbicort 160 as well as Spiriva Respimat 2.5 mMRC 3 today ACT score 15 today  Slight end expiratory wheeze on exam today  Plan: Continue Symbicort 160, spacer provided today Continue Spiriva Respimat 2.5 Continue Singulair daily Continue Claritin  Can start chlortabs if pnd and cough worsens  As needed Prednisone taper provided today  Can also do nasal saline rinses May need to consider biologic therapy to help with recurrent exacerbations based off of eosinophilia and asthmatic component on pulmonary function test >>> Could consider chronic obstructive asthma diagnosis Consider RAST panel with IgE at next office visit if patient is able to remain off of prednisone chronically

## 2018-03-27 NOTE — Progress Notes (Signed)
@Patient  ID: Julia Daniels, female    DOB: 1950/02/18, 68 y.o.   MRN: [id number]  Chief Complaint  Patient presents with  . 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  Patient with MS phenotype for alpha-1.  COPD Gold 3 criteria.  Patient is a frequent exacerbate her.  Four COPD exacerbations of last 3 months (December/2019 till end of February/2020), 3 of the 4 exacerbations requiring hospitalization.  Most recent hospitalization being February/27/2020- February/29/2020.  PMH: Obesity, anxiety, bipolar disorder, ADHD Smoker/ Smoking History: Former smoker.  Quit 08/2017.  40-pack-year smoking history. Maintenance: Symbicort 160, Spiriva 2.5 Pt of: Dr. Melvyn Novas  03/27/2018  - Visit   68 year old female former smoker presenting to our office today for a follow-up visit.  Patient reports that since last office visit her breathing has been well controlled.  Patient reports that she is finished her prednisone and has not had to use her rescue inhaler at all since last office visit.  She also has not had to use her albuterol nebulized treatments.  She remains on Spiriva Respimat 2.5 and Symbicort 160.  She reports adherence to this.  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  Patient has never had formal allergy testing.  She reports that she has been maintained on Claritin as well as Singulair since last office visit has been doing well.  She is out of her prednisone which Dr. Melvyn Novas had prescribed for her a few office visits ago for her to have just in case she needs it.  She is wondering if this can be refilled today.  mMRC today is 3.  Act score today is 15.   MMRC - Breathlessness Score 3 - I stop for breath after walking about 100 yards or after a few minutes on level ground (isle at grocery store is 163ft)   Asthma Control Test Answer Options: All of the time-1, most of the time-2, some of the time-3, a little of the time-4, none of the time-5  In the past 4 weeks how much of your time did your asthma keep you from getting as much work done at school, home, work? 3 During the past 4 weeks how often if he had short of breath? 2 During the past 4 weeks how often is your asthma symptoms (wheezing, coughing, shortness of breath, chest tightness, or pain) wake you up at night or earlier than usual in the morning? NONE - 5 During the past 4 weeks how often have you used her rescue inhaler and nebulized medications (such as albuterol)? NONE -5  How would you rate your asthma control during the past 4 weeks? Worsened over past few days   If score is 19 or less indicates your asthma may not be under control.  Score: 15      Tests:   12/31/2017-CBC with differential- eosinophils relative 8, eosinophils absolute 0.9  01/05/2019-chest x-ray- no active cardiopulmonary disease, chronic bronchitic changes  08/23/2017-CT angio chest- no PE, or acute chest pathology, mild basilar atelectasis or scarring  03/06/2018-pulmonary function test- FVC 1.37 (45% predicted), postbronchodilator ratio 66, postbronchodilator FEV1 1.02 (43% predicted), significant bronchodilator response, significant mid flow reversibility after bronchodilator administration, DLCO 59 >>>Severe obstructive airways disease with reversible component, moderate diffusion defect  10/31/2017-alpha-1 antitrypsin phenotype PI MS, level: 121   FENO:  No results  found for: NITRICOXIDE  PFT: PFT Results Latest Ref Rng & Units 03/06/2018  FVC-Pre L 1.37  FVC-Predicted Pre % 45  FVC-Post L 1.63  FVC-Predicted Post % 53  Pre FEV1/FVC % % 58  Post FEV1/FCV % % 62  FEV1-Pre L 0.80  FEV1-Predicted Pre % 34  FEV1-Post L 1.02  DLCO UNC% % 59  DLCO COR %Predicted % 97  TLC L 4.42  TLC % Predicted % 90  RV % Predicted % 139     Imaging: Dg Chest 2 View  Result Date: 03/07/2018 CLINICAL DATA:  68 year old female with shortness of breath. EXAM: CHEST - 2 VIEW COMPARISON:  Chest radiograph dated 01/31/2018 FINDINGS: Mild chronic bronchitic changes. No focal consolidation, pleural effusion, or pneumothorax. The cardiac silhouette is within normal limits. No acute osseous pathology. IMPRESSION: No active cardiopulmonary disease. Electronically Signed   By: Anner Crete M.D.   On: 03/07/2018 06:52      Specialty Problems      Pulmonary Problems   COPD  GOLD ? III with increased Eos and reversibility ? ACOS ?     Quit smoking 08/2017  Spirometry 10/31/2017  FEV1 0.7 (30%)  Ratio 51 p Breo in am   - 10/31/2017  Eos = 0.9  - alpha one AT screen 10/31/2017   MS level 121  - 10/31/2017   Walked RA x one lap @ 185 stopped due to sob s desats at fast pace   - 01/18/2018   Walked RA  2 laps @ 218ft each @ fast  pace  stopped due to sob no desat  - Spirometry 01/18/2018  FEV1 1.1 (49%)  Ratio 63 with mild curvature after am spiriva and symbicort   - 02/11/2018  After extensive coaching inhaler device,  effectiveness =    75% short Ti and added prednisone as plan D / changed spiriva to smi - PFT's 03/06/18  FEV1 1.02 (43 % ) ratio 0.62  p 27 % improvement from saba p nothing prior to study with DLCO  59/63 % corrects to 77  % for alv volume  With ERV 65       Acute respiratory failure with hypoxia and hypercapnia (HCC)   COPD with acute exacerbation (HCC)   COPD exacerbation (HCC)   Hypoxia   Acute on chronic respiratory failure (HCC)   Respiratory failure, acute-on-chronic (HCC)   Acute on chronic respiratory failure with hypoxia (HCC)   Acute respiratory failure with hypoxia (HCC)   Allergic rhinitis      Allergies  Allergen Reactions  . Sulfa Antibiotics Other (See Comments)    Mouth gets raw    Immunization History  Administered Date(s) Administered  . Influenza Whole 09/09/2017  . Influenza,inj,Quad PF,6+  Mos 09/23/2012  . Pneumococcal Polysaccharide-23 02/02/2018  . Tdap 02/07/2013    Past Medical History:  Diagnosis Date  . Anxiety   . Asthma   . COPD (chronic obstructive pulmonary disease) ([place])   . Depression     Tobacco History: Social History   Tobacco Use  Smoking Status Former Smoker  . Packs/day: 1.00  . Years: 40.00  . Pack years: 40.00  . Types: Cigarettes  . Last attempt to quit: 08/23/2017  . Years since quitting: 0.5  Smokeless Tobacco Never Used   Counseling given: Yes  Continue to not smoke  Outpatient Encounter Medications as of 03/27/2018  Medication Sig  . albuterol (PROAIR HFA) 108 (90 Base) MCG/ACT inhaler Inhale 2 puffs into the lungs every 6 (six)  hours as needed for wheezing or shortness of breath.  . ALPRAZolam (XANAX) 0.25 MG tablet Take 0.25 mg by mouth daily as needed for anxiety.   Marland Kitchen atorvastatin (LIPITOR) 20 MG tablet Take 20 mg by mouth at bedtime.  . bismuth subsalicylate (PEPTO BISMOL) 262 MG chewable tablet Chew 262-524 mg by mouth daily as needed for diarrhea or loose stools.  . budesonide-formoterol (SYMBICORT) 160-4.5 MCG/ACT inhaler Take 2 puffs first thing in am and then another 2 puffs about 12 hours later. (Patient taking differently: Inhale 2 puffs into the lungs 2 (two) times daily. )  . Dexmethylphenidate HCl 40 MG CP24 Take 40 mg by mouth daily.   . famotidine (PEPCID) 20 MG tablet One at bedtime (Patient taking differently: Take 20 mg by mouth at bedtime. )  . ipratropium-albuterol (DUONEB) 0.5-2.5 (3) MG/3ML SOLN Take 3 mLs by nebulization every 6 (six) hours as needed. (Patient taking differently: Take 3 mLs by nebulization every 6 (six) hours as needed (shortness of breath). )  . lamoTRIgine (LAMICTAL) 200 MG tablet Take 200 mg by mouth at bedtime.   Marland Kitchen loratadine (CLARITIN) 10 MG tablet Take 10 mg by mouth daily.  . montelukast (SINGULAIR) 10 MG tablet Take 1 tablet (10 mg total) by mouth at bedtime.  . pantoprazole (PROTONIX)  40 MG tablet Take 1 tablet (40 mg total) by mouth daily. Take 30-60 min before first meal of the day  . QUEtiapine (SEROQUEL) 50 MG tablet Take 100 mg by mouth at bedtime.  . risperiDONE (RISPERDAL) 1 MG tablet Take 3 mg by mouth at bedtime.   Marland Kitchen Spacer/Aero-Holding Chambers (AEROCHAMBER MV) inhaler Use as instructed  . Tiotropium Bromide Monohydrate (SPIRIVA RESPIMAT) 2.5 MCG/ACT AERS 2 pffs first thing when wake up (Patient taking differently: Inhale 2 puffs into the lungs daily. )  . venlafaxine XR (EFFEXOR-XR) 150 MG 24 hr capsule Take 150 mg by mouth at bedtime.   . Vitamin D, Ergocalciferol, (DRISDOL) 50000 units CAPS capsule Take 50,000 Units by mouth every Saturday.   . predniSONE (DELTASONE) 10 MG tablet Take as precribed on AVS.  . [DISCONTINUED] metroNIDAZOLE (FLAGYL) 500 MG tablet   . [DISCONTINUED] predniSONE (DELTASONE) 10 MG tablet Prednisone dosing: Take  Prednisone 40mg  (4 tabs) x 3 days, then taper to 30mg  (3 tabs) x 3 days, then 20mg  (2 tabs) x 3days, then 10mg  (1 tab) x 3days, then OFF.  . [DISCONTINUED] predniSONE (DELTASONE) 10 MG tablet Prednisone dosing: Take  Prednisone 40mg  (4 tabs) x 3 days, then taper to 30mg  (3 tabs) x 3 days, then 20mg  (2 tabs) x 3days, then 10mg  (1 tab) x 3days, then OFF.   No facility-administered encounter medications on file as of 03/27/2018.      Review of Systems  Review of Systems  Constitutional: Positive for fatigue. Negative for chills, fever and unexpected weight change.  HENT: Positive for congestion, postnasal drip and rhinorrhea. Negative for ear pain, sinus pressure and sinus pain.   Respiratory: Positive for cough (slight dry cough ). Negative for chest tightness, shortness of breath and wheezing.   Cardiovascular: Negative for chest pain and palpitations.  Gastrointestinal: Negative for diarrhea, nausea and vomiting.  Musculoskeletal: Negative for arthralgias.  Skin: Negative for color change.  Allergic/Immunologic: Negative for  environmental allergies and food allergies.  Neurological: Negative for dizziness, light-headedness and headaches.  Psychiatric/Behavioral: Negative for dysphoric mood. The patient is not nervous/anxious.   All other systems reviewed and are negative.    Physical Exam  BP 126/74 (BP  Location: Left Arm, Cuff Size: Normal)   Pulse 81   Temp 98.7 F (37.1 C) (Oral)   Ht 5\' 3"  (1.6 m)   Wt 201 lb (91.2 kg)   SpO2 99%   BMI 35.61 kg/m   Wt Readings from Last 5 Encounters:  03/27/18 201 lb (91.2 kg)  03/14/18 201 lb 6.4 oz (91.4 kg)  03/07/18 198 lb 6.6 oz (90 kg)  02/11/18 197 lb 9.6 oz (89.6 kg)  01/31/18 192 lb 7.4 oz (87.3 kg)     Physical Exam  Constitutional: She is oriented to person, place, and time and well-developed, well-nourished, and in no distress. No distress.  HENT:  Head: Normocephalic and atraumatic.  Right Ear: Hearing, tympanic membrane, external ear and ear canal normal.  Left Ear: Hearing, tympanic membrane, external ear and ear canal normal.  Nose: Nose normal. Right sinus exhibits no maxillary sinus tenderness and no frontal sinus tenderness. Left sinus exhibits no maxillary sinus tenderness and no frontal sinus tenderness.  Mouth/Throat: Uvula is midline and oropharynx is clear and moist. No oropharyngeal exudate.  pnd  Eyes: Pupils are equal, round, and reactive to light.  Neck: Normal range of motion. Neck supple.  Cardiovascular: Normal rate, regular rhythm and normal heart sounds.  Pulmonary/Chest: Effort normal. No accessory muscle usage. No respiratory distress. She has no decreased breath sounds. She has wheezes (slight end exp wheeze). She has no rhonchi.  Musculoskeletal: Normal range of motion.        General: No edema.  Lymphadenopathy:    She has no cervical adenopathy.  Neurological: She is alert and oriented to person, place, and time. Gait normal.  Skin: Skin is warm and dry. She is not diaphoretic. No erythema.  Psychiatric: Memory,  affect and judgment normal. Her mood appears anxious. She does not exhibit a depressed mood.  Nursing note and vitals reviewed.     Lab Results:  CBC    Component Value Date/Time   WBC 10.7 (H) 03/09/2018 0413   RBC 3.72 (L) 03/09/2018 0413   HGB 12.0 03/09/2018 0413   HCT 39.4 03/09/2018 0413   PLT 275 03/09/2018 0413   MCV 105.9 (H) 03/09/2018 0413   MCH 32.3 03/09/2018 0413   MCHC 30.5 03/09/2018 0413   RDW 13.0 03/09/2018 0413   LYMPHSABS 1.4 12/31/2017 1317   MONOABS 0.5 12/31/2017 1317   EOSABS 0.9 (H) 12/31/2017 1317   BASOSABS 0.1 12/31/2017 1317    BMET    Component Value Date/Time   NA 140 03/09/2018 0413   K 4.9 03/09/2018 0413   CL 107 03/09/2018 0413   CO2 25 03/09/2018 0413   GLUCOSE 128 (H) 03/09/2018 0413   BUN 14 03/09/2018 0413   CREATININE 0.68 03/09/2018 0413   CALCIUM 9.9 03/09/2018 0413   GFRNONAA >60 03/09/2018 0413   GFRAA >60 03/09/2018 0413    BNP    Component Value Date/Time   BNP 50.1 08/23/2017 1206    ProBNP No results found for: PROBNP    Assessment & Plan:     COPD  GOLD ? III with increased Eos and reversibility ? ACOS ?  Assessment: October and December/2019 show peripheral eosinophilia - 900 February/2020 pulmonary function test showing COPD Gold 3 with bronchodilator response February/2020 DLCO 59 February/2020 PFT showing restriction BMI 35.68 Patient reports adherence to Symbicort 160 as well as Spiriva Respimat 2.5 mMRC 3 today ACT score 15 today  Slight end expiratory wheeze on exam today  Plan: Continue Symbicort 160, spacer provided  today Continue Spiriva Respimat 2.5 Continue Singulair daily Continue Claritin  Can start chlortabs if pnd and cough worsens  As needed Prednisone taper provided today  Can also do nasal saline rinses May need to consider biologic therapy to help with recurrent exacerbations based off of eosinophilia and asthmatic component on pulmonary function test >>> Could consider  chronic obstructive asthma diagnosis Consider RAST panel with IgE at next office visit if patient is able to remain off of prednisone chronically  Allergic rhinitis Assessment: Improved air symptoms on exam today  Plan: Continue Singulair daily Continue Claritin daily Could consider starting chlor tabs if postnasal drip worsens Consider RAST panel with IgE at next office visit if patient remains off of prednisone      Lauraine Rinne, NP 03/27/2018   This appointment was 26 min long with over 50% of the time in direct face-to-face patient care, assessment, plan of care, and follow-up.

## 2018-03-28 NOTE — Progress Notes (Signed)
Chart and office note reviewed in detail  > agree with a/p as outlined    

## 2018-04-11 ENCOUNTER — Other Ambulatory Visit: Payer: Self-pay | Admitting: Internal Medicine

## 2018-04-25 ENCOUNTER — Encounter: Payer: Self-pay | Admitting: Internal Medicine

## 2018-04-25 ENCOUNTER — Other Ambulatory Visit: Payer: Self-pay

## 2018-04-25 ENCOUNTER — Ambulatory Visit (INDEPENDENT_AMBULATORY_CARE_PROVIDER_SITE_OTHER): Payer: BLUE CROSS/BLUE SHIELD | Admitting: Internal Medicine

## 2018-04-25 DIAGNOSIS — J449 Chronic obstructive pulmonary disease, unspecified: Secondary | ICD-10-CM

## 2018-04-25 DIAGNOSIS — J9621 Acute and chronic respiratory failure with hypoxia: Secondary | ICD-10-CM | POA: Diagnosis not present

## 2018-04-25 MED ORDER — PREDNISONE 10 MG PO TABS: ORAL_TABLET | ORAL | 0 refills | Status: DC

## 2018-04-25 NOTE — Progress Notes (Signed)
Julia Daniels, female    DOB: 1950-01-24    MRN: [id number]     Brief patient profile:  54 yowf  MS phenotype for alpha one who  quit smoking 08/23/17  With indolent  onset around 2014 doe gradually worse and started [facility] by [place] 2017 then [facility] late summer 2019 with dx of aecopd  And since then highly variable sob So self- referred to pulmonary clinic 10/31/2017 with GOLD III criteria for copd on initial eval.    History of Present Illness  10/31/2017  Pulmonary/ 1st office eval/ Teriana Danker Chief Complaint  Patient presents with  . Pulmonary Consult    Self referral for COPD. Pt was dxed with COPD 2 66yrs ago. She has been seen in the past by Dr Verdie Mosher. She states she gets tired easily. She gets winded with carrying something heavy and some days with minimal walking. She uses proair once daily on average and neb with albuterol 3-4 x per wk.   Dyspnea:  Best days doe = MMRC3 = can't walk 100 yards even at a slow pace at a flat grade s stopping due to sob  / better p a few hours but highly variable s pattern  Cough: better since stopped smoking Sleep: bed is flat/ 2 pillows / no am flare  SABA use: neb helps the most  rec Plan A = Automatic = symbicort 160 Take 2 puffs first thing in am and then another 2 puffs about 12 hours later.  Work on inhaler technique:  Plan B = Backup Only use your albuterol as a rescue medication Plan C = Crisis - only use your albuterol nebulizer if you first try Plan B and it fails to help  Plan D = Deltasone  If worse despite A thru C >  Try Deltasone = Prednisone 10 mg take  4 each am x 2 days,   2 each am x 2 days,  1 each am x 2 days and stop       11/08/2017  f/u ov/Jamall Strohmeier re:  GOLD III / no better on symb 160 2bid  Chief Complaint  Patient presents with  . Follow-up    c/o sob with exertion, using rescue inhaler daily.   Dyspnea:  Not really much better, never used prednisone as rec  Cough: no Sleeping: bed flat 2 pillows  SABA use:  about twice daily hfa and neb  02: none  rec Go ahead and take the prednisone you have on hand> a lot better and maintained on symb 160 2bid   Work on inhaler technique:  Only use your albuterol as a rescue medication     12/04/2017  f/u ov/Caralina Nop copd III  maint symb 160 2bid  Chief Complaint  Patient presents with  . Follow-up    Breathing is much improved since the last visit. She has not used her albuterol inhaler or neb.    Dyspnea:  MMRC2 = can't walk a nl pace on a flat grade s sob but does fine slow and flat  Cough: better Sleeping: ok bed flat 2 pillows  SABA use: min rec Work on inhaler technique:   Please schedule a follow up visit in 3 months but call sooner if needed  PFT's on return      Admit date: 12/31/2017 Discharge date: 01/02/2018   01/18/2018  f/u ov/Kendrell Lottman re:  Copd GOLD III  symbicort / spiriva handihaler and one day of prednisone Chief Complaint  Patient presents with  .  Follow-up    Breathing is okay today. She has had another hospital stay since last visit for increased SOB. She has not use her rescue inhaler or neb since last hospitalization.   Dyspnea:  MMRC2 = can't walk a nl pace on a flat grade s sob but does fine slow and flat  Cough: none Sleeping:  Bed flat/ one pillow  SABA use: none now   02: none  Last flare occurred at work per H&P notes and she did not respond initially to saba  rec Plan A = Automatic = symbicort 160 Take 2 puffs and spiriva  first thing when you wake up  and then another 2 puffs of symbicort about 12 hours later. Also add Pantoprazole (protonix) 40 mg   Take  30-60 min before first meal of the day and Pepcid (famotidine)  20 mg one @  bedtime until return to office - this is the best way to tell whether stomach acid is contributing to your problem.   Work on inhaler technique:  Plan B = Backup Only use your albuterol as a rescue medication Plan C = Crisis - only use your albuterol nebulizer if you first try Plan B and  it fails to help > ok to use the nebulizer up to every 4 hours but if start needing it regularly call for immediate appointment Plan D = Deltasone  If worse despite A thru C >  Try Deltasone = Prednisone 10 mg take  4 each am x 2 days,   2 each am x 2 days,  1 each am x 2 days and stop    GERD (REFLUX)  is an extremely common cause of respiratory symptoms just like yours , many times with no obvious heartburn at all.  Please keep appt for pfts  with all medications /inhalers/ solutions in hand so we can verify exactly what you are taking. This includes all medications from all doctors and over the counters   02/11/2018  f/u ov/Ferrah Panagopoulos re:  S/p aecopd Finished pred on Jan 30 th x 40 mg  Chief Complaint  Patient presents with  . Shortness of Breath  Dyspnea: Jan 23 slept fine that "night"  p usual dose of symb/ spiriva felt congested / chest tight / then 2 puffs of saba hfa/ then neb then [facility] p onset > 3 day admit Cough: never Sleeping: on wedge  SABA use: none now  02: none rec Plan A = Automatic = symbicort 160 Take 2 puffs and spiriva 2puffs  first thing when you wake up  and then another 2 puffs of symbicort about 12 hours later. Also add Pantoprazole (protonix) 40 mg   Take  30-60 min before first meal of the day and Pepcid (famotidine)  20 mg one @  bedtime until return to office - this is the best way to tell whether stomach acid is contributing to your problem.   Work on inhaler technique:     Plan B = Backup Only use your albuterol as a rescue medication  Plan C = Crisis - only use your albuterol nebulizer if you first try Plan B and it fails to help > ok to use the nebulizer up to every 4 hours but if start needing it regularly call for immediate appointment Plan D = Deltasone If immediate relief worse A thru C >  Try Deltasone = Prednisone 10 mg take  4 each am x 2 days,   2 each am x  2 days,  1 each am x 2 days and stop    GERD diet  Keep your previous appts - call sooner  if needed   late add: consider allergy testing/ add singulair next ?    03/14/18 NP rec Continue Symbicort 160 >>> 2 puffs in the morning right when you wake up, rinse out your mouth after use, 12 hours later 2 puffs, rinse after use >>> Take this daily, no matter what >>> This is not a rescue inhaler  >>>use spacer given today in office  Spiriva Respimat 2.5 >>> 2 puffs daily >>> Do this every day >>>This is not a rescue inhaler  Start singulair 10mg  daily   Please start taking a daily antihistamine:  >>>choose one of: zyrtec, claritin, allegra, or xyzal     Virtual Visit via Telephone Note 04/25/2018   I connected with Prudencio Burly on 04/25/18 at  1:45 PM EDT by telephone and verified that I am speaking with the correct person using two identifiers.   I discussed the limitations, risks, security and privacy concerns of performing an evaluation and management service by telephone and the availability of in person appointments. I also discussed with the patient that there may be a patient responsible charge related to this service. The patient expressed understanding and agreed to proceed.   History of Present Illness: recurrenty sob Onset April 13th neg resp to saba hfa/ waited too long to start saba neb as per action plan but ended up seeing pcp >   restarted prednisone x 12 day course and improved, no need for saba on day of televist  Dyspnea:  Able to walk outside to mb and back / sob but recovers with rest/ no assoc cp Cough: assoc with pnds/ outdoor exposures Sleeping: fine on bed blocks  SABA use: none now 02: using since last admit :  2lpm hs and prn daytime   No obvious patterns in  day to day or daytime variability or assoc excess/ purulent sputum or mucus plugs or hemoptysis or cp or chest tightness, subjective wheeze or overt   hb symptoms.    Also denies any obvious fluctuation of symptoms with weather or environmental changes or other aggravating or  alleviating factors except as outlined above.  Note did not used to have problems with spring time but this year some watery eyes/ running nose with exp   Meds reviewed/ med reconciliation completed         Observations/Objective: Voice texture good, able to speak in full sentences    Assessment and Plan: See problem list for active a/p's   Follow Up Instructions: See avs for instructions unique to this ov which includes revised/ updated med list     I discussed the assessment and treatment plan with the patient. The patient was provided an opportunity to ask questions and all were answered. The patient agreed with the plan and demonstrated an understanding of the instructions.   The patient was advised to call back or seek an in-person evaluation if the symptoms worsen or if the condition fails to improve as anticipated.  I provided 25 minutes of non-face-to-face time during this encounter.   Christinia Gully, MD

## 2018-04-25 NOTE — Assessment & Plan Note (Addendum)
Started 02 from admit 01/31/18  rx as of 04/25/2018  = 2lpm hs and titrate daytime to keep > 90% sats by pulse oxymetry    Each maintenance medication was reviewed in detail including most importantly the difference between maintenance and as needed and under what circumstances the prns are to be used.  Please see AVS for specific  Instructions which are unique to this visit and I personally typed out  which were reviewed in detail in writing with the patient and a copy provided.

## 2018-04-25 NOTE — Assessment & Plan Note (Signed)
Quit smoking 08/2017  Spirometry 10/31/2017  FEV1 0.7 (30%)  Ratio 51 p Breo in am   - 10/31/2017  Eos = 0.9  - alpha one AT screen 10/31/2017   MS level 121  - 10/31/2017   Walked RA x one lap @ 185 stopped due to sob s desats at fast pace   - 01/18/2018   Walked RA  2 laps @ 272ft each @ fast  pace  stopped due to sob no desat  - Spirometry 01/18/2018  FEV1 1.1 (49%)  Ratio 63 with mild curvature after am spiriva and symbicort   - 02/11/2018  After extensive coaching inhaler device,  effectiveness =    75% short Ti and added prednisone as plan D / changed spiriva to smi - PFT's 03/06/18  FEV1 1.02 (43 % ) ratio 0.62  p 27 % improvement from saba p nothing prior to study with DLCO  59/63 % corrects to 77  % for alv volume  With ERV 65  - Singulair 03/14/2018 >>>  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

## 2018-04-25 NOTE — Patient Instructions (Addendum)
No change in recommendations  If breathing worsens, prednisone 10mg   Take 4 for three days 3 for three days 2 for three days 1 for three days and stop   02 2lpm at bedtime and then as needed during the day to keep your 02 sats above 90%  Please schedule a follow up office visit in 4 weeks, sooner if needed  with all medications /inhalers/ solutions in hand so we can verify exactly what you are taking. This includes all medications from all doctors and over the counters

## 2018-04-26 ENCOUNTER — Encounter: Payer: Self-pay | Admitting: Internal Medicine

## 2018-05-03 ENCOUNTER — Ambulatory Visit: Payer: Self-pay | Admitting: Internal Medicine

## 2018-05-16 ENCOUNTER — Telehealth: Payer: Self-pay | Admitting: Internal Medicine

## 2018-05-16 NOTE — Telephone Encounter (Signed)
Received a fax from pharmacy stating that the patient needed a PA for her Spiriva Respimat.   Medication name and strength: Spiriva Respimat 2.86mcg Provider: [facility] [address] Patient insurance ID:  Phone:  Fax:   Was the PA started on CMM?  Yes If yes, please enter the Key: [id number] Timeframe for approval/denial: Instant  Per CMM, a PA is not needed since the medication is covered by patient's plan. They recommend that the pharmacy resubmit the information.   Spoke with a phar tech at [facility]. They are aware of the situation and will resubmit the claim. Nothing further needed at time of call.

## 2018-05-20 ENCOUNTER — Other Ambulatory Visit: Payer: Self-pay | Admitting: Internal Medicine

## 2018-05-20 DIAGNOSIS — J449 Chronic obstructive pulmonary disease, unspecified: Secondary | ICD-10-CM

## 2018-05-20 NOTE — Telephone Encounter (Signed)
Followed up on PA today Your information has been submitted to [employer]. [employer] is reviewing the PA request and you will receive an electronic response. You may check for the updated outcome later by reopening this request. The standard fax determination will also be sent to you directly. If you have any questions about your PA submission, contact [employer] at [phone].

## 2018-05-24 ENCOUNTER — Ambulatory Visit: Payer: Self-pay | Admitting: Internal Medicine

## 2018-05-24 ENCOUNTER — Encounter: Payer: Self-pay | Admitting: Internal Medicine

## 2018-05-24 ENCOUNTER — Other Ambulatory Visit: Payer: Self-pay

## 2018-05-24 ENCOUNTER — Ambulatory Visit: Payer: BLUE CROSS/BLUE SHIELD | Admitting: Internal Medicine

## 2018-05-24 VITALS — BP 124/80 | HR 116 | Temp 98.9°F | Ht 63.0 in | Wt 204.0 lb

## 2018-05-24 DIAGNOSIS — J449 Chronic obstructive pulmonary disease, unspecified: Secondary | ICD-10-CM | POA: Diagnosis not present

## 2018-05-24 LAB — CBC WITH DIFFERENTIAL/PLATELET
Basophils Absolute: 0.1 10*3/uL (ref 0.0–0.1)
Basophils Relative: 0.9 % (ref 0.0–3.0)
Eosinophils Absolute: 0.6 10*3/uL (ref 0.0–0.7)
Eosinophils Relative: 7.9 % — ABNORMAL HIGH (ref 0.0–5.0)
HCT: 39.6 % (ref 36.0–46.0)
Hemoglobin: 13.4 g/dL (ref 12.0–15.0)
Lymphocytes Relative: 23.8 % (ref 12.0–46.0)
Lymphs Abs: 1.7 10*3/uL (ref 0.7–4.0)
MCHC: 33.8 g/dL (ref 30.0–36.0)
MCV: 96.7 fl (ref 78.0–100.0)
Monocytes Absolute: 0.5 10*3/uL (ref 0.1–1.0)
Monocytes Relative: 7.2 % (ref 3.0–12.0)
Neutro Abs: 4.4 10*3/uL (ref 1.4–7.7)
Neutrophils Relative %: 60.2 % (ref 43.0–77.0)
Platelets: 322 10*3/uL (ref 150.0–400.0)
RBC: 4.1 Mil/uL (ref 3.87–5.11)
RDW: 13.7 % (ref 11.5–15.5)
WBC: 7.3 10*3/uL (ref 4.0–10.5)

## 2018-05-24 MED ORDER — BUDESONIDE-FORMOTEROL FUMARATE 160-4.5 MCG/ACT IN AERO: 2.0000 | INHALATION_SPRAY | Freq: Two times a day (BID) | RESPIRATORY_TRACT | 0 refills | Status: DC

## 2018-05-24 MED ORDER — TIOTROPIUM BROMIDE MONOHYDRATE 2.5 MCG/ACT IN AERS: 2.0000 | INHALATION_SPRAY | Freq: Every day | RESPIRATORY_TRACT | 0 refills | Status: DC

## 2018-05-24 MED ORDER — ALBUTEROL SULFATE (2.5 MG/3ML) 0.083% IN NEBU: 2.5000 mg | INHALATION_SOLUTION | Freq: Four times a day (QID) | RESPIRATORY_TRACT | 12 refills | Status: DC | PRN

## 2018-05-24 NOTE — Progress Notes (Signed)
Julia Daniels, female    DOB: 09-Jan-1951    MRN: [id number]     Brief patient profile:  49 yowf  MS phenotype for alpha one who  quit smoking 08/23/17  With indolent  onset around 2014 doe gradually worse and started [facility] [place] 2017 then admit [facility] late summer 2019 with dx of aecopd  And since then highly variable sob So self- referred to pulmonary clinic 10/31/2017 with GOLD III criteria for copd on initial eval.    History of Present Illness  10/31/2017  Pulmonary/ 1st office eval/ Julia Daniels Chief Complaint  Patient presents with  . Pulmonary Consult    Self referral for COPD. Pt was dxed with COPD 2 75yrs ago. She has been seen in the past by Dr Verdie Mosher. She states she gets tired easily. She gets winded with carrying something heavy and some days with minimal walking. She uses proair once daily on average and neb with albuterol 3-4 x per wk.   Dyspnea:  Best days doe = MMRC3 = can't walk 100 yards even at a slow pace at a flat grade s stopping due to sob  / better p a few hours but highly variable s pattern  Cough: better since stopped smoking Sleep: bed is flat/ 2 pillows / no am flare  SABA use: neb helps the most  rec Plan A = Automatic = symbicort 160 Take 2 puffs first thing in am and then another 2 puffs about 12 hours later.  Work on inhaler technique:  Plan B = Backup Only use your albuterol as a rescue medication Plan C = Crisis - only use your albuterol nebulizer if you first try Plan B and it fails to help  Plan D = Deltasone  If worse despite A thru C >  Try Deltasone = Prednisone 10 mg take  4 each am x 2 days,   2 each am x 2 days,  1 each am x 2 days and stop       11/08/2017  f/u ov/Julia Daniels re:  GOLD III / no better on symb 160 2bid  Chief Complaint  Patient presents with  . Follow-up    c/o sob with exertion, using rescue inhaler daily.   Dyspnea:  Not really much better, never used prednisone as rec  Cough: no Sleeping: bed flat 2 pillows  SABA use:  about twice daily hfa and neb  02: none  rec Go ahead and take the prednisone you have on hand> a lot better and maintained on symb 160 2bid   Work on inhaler technique:  Only use your albuterol as a rescue medication     12/04/2017  f/u ov/Julia Daniels copd III  maint symb 160 2bid  Chief Complaint  Patient presents with  . Follow-up    Breathing is much improved since the last visit. She has not used her albuterol inhaler or neb.    Dyspnea:  MMRC2 = can't walk a nl pace on a flat grade s sob but does fine slow and flat  Cough: better Sleeping: ok bed flat 2 pillows  SABA use: min rec Work on inhaler technique:   Please schedule a follow up visit in 3 months but call sooner if needed  PFT's on return      Admit date: 12/31/2017 Discharge date: 01/02/2018   01/18/2018  f/u ov/Julia Daniels re:  Copd GOLD III  symbicort / spiriva handihaler and one day of prednisone Chief Complaint  Patient presents with  .  Follow-up    Breathing is okay today. She has had another hospital stay since last visit for increased SOB. She has not use her rescue inhaler or neb since last hospitalization.   Dyspnea:  MMRC2 = can't walk a nl pace on a flat grade s sob but does fine slow and flat  Cough: none Sleeping:  Bed flat/ one pillow  SABA use: none now   02: none  Last flare occurred at work per H&P notes and she did not respond initially to saba  rec Plan A = Automatic = symbicort 160 Take 2 puffs and spiriva  first thing when you wake up  and then another 2 puffs of symbicort about 12 hours later. Also add Pantoprazole (protonix) 40 mg   Take  30-60 min before first meal of the day and Pepcid (famotidine)  20 mg one @  bedtime until return to office - this is the best way to tell whether stomach acid is contributing to your problem.   Work on inhaler technique:  Plan B = Backup Only use your albuterol as a rescue medication Plan C = Crisis - only use your albuterol nebulizer if you first try Plan B and  it fails to help > ok to use the nebulizer up to every 4 hours but if start needing it regularly call for immediate appointment Plan D = Deltasone  If worse despite A thru C >  Try Deltasone = Prednisone 10 mg take  4 each am x 2 days,   2 each am x 2 days,  1 each am x 2 days and stop    GERD (REFLUX)  is an extremely common cause of respiratory symptoms just like yours , many times with no obvious heartburn at all.  Please keep appt for pfts  with all medications /inhalers/ solutions in hand so we can verify exactly what you are taking. This includes all medications from all doctors and over the counters    02/11/2018  f/u ov/Julia Daniels re:  S/p aecopd Finished pred on Jan 30 th x 40 mg  Chief Complaint  Patient presents with  . Shortness of Breath  Dyspnea: Jan 23 slept fine that "night"  p usual dose of symb/ spiriva felt congested / chest tight / then 2 puffs of saba hfa/ then neb then [facility] p onset > 3 day admit Cough: never Sleeping: on wedge  SABA use: none now  02: none rec No change in recommendations If breathing worsens, prednisone 10mg   Take 4 for three days 3 for three days 2 for three days 1 for three days and stop  02 2lpm at bedtime and then as needed during the day to keep your 02 sats above 90% Please schedule a follow up office visit in 4 weeks, sooner if needed  with all medications /inhalers/ solutions in hand so we can verify exactly what you are taking. This includes all medications from all doctors and over the counters     05/24/2018  f/u ov/Julia Daniels re: ACOS with increased eos  Dyspnea:  Every 3 rd week p stops pred feels worse despite symbicort/singulair/ spriva  Cough: spells of dry cough s specific trigger  Sleeping: blocks under bed and 2 pillows never noct spells SABA use: none in 48 h though says this is a bad week and her symbicort and ppi have run out and hasn't picked them up, unsure of how many doses of either she's missed  02: prn  No obvious day  to day or daytime variability or assoc excess/ purulent sputum or mucus plugs or hemoptysis or cp or chest tightness, subjective wheeze or overt sinus or hb symptoms.   Sleeping as above  without nocturnal  or early am exacerbation  of respiratory  c/o's or need for noct saba. Also denies any obvious fluctuation of symptoms with weather or environmental changes or other aggravating or alleviating factors except as outlined above   No unusual exposure hx or h/o childhood pna/ asthma or knowledge of premature birth.  Current Allergies, Complete Past Medical History, Past Surgical History, Family History, and Social History were reviewed in [employer] record.  ROS  The following are not active complaints unless bolded Hoarseness, sore throat, dysphagia, dental problems, itching, sneezing,  nasal congestion or discharge of excess mucus or purulent secretions, ear ache,   fever, chills, sweats, unintended wt loss or wt gain, classically pleuritic or exertional cp,  orthopnea pnd or arm/hand swelling  or leg swelling, presyncope, palpitations, abdominal pain, anorexia, nausea, vomiting, diarrhea  or change in bowel habits or change in bladder habits, change in stools or change in urine, dysuria, hematuria,  rash, arthralgias, visual complaints, headache, numbness, weakness or ataxia or problems with walking or coordination,  change in mood= anxious  or  memory.        Current Meds  Medication Sig  . albuterol (PROAIR HFA) 108 (90 Base) MCG/ACT inhaler Inhale 2 puffs into the lungs every 6 (six) hours as needed for wheezing or shortness of breath.  . ALPRAZolam (XANAX) 0.25 MG tablet Take 0.25 mg by mouth daily as needed for anxiety.   Marland Kitchen atorvastatin (LIPITOR) 20 MG tablet Take 20 mg by mouth at bedtime.  . bismuth subsalicylate (PEPTO BISMOL) 262 MG chewable tablet Chew 262-524 mg by mouth daily as needed for diarrhea or loose stools.  . budesonide-formoterol (SYMBICORT) 160-4.5  MCG/ACT inhaler Take 2 puffs first thing in am and then another 2 puffs about 12 hours later. (Patient taking differently: Inhale 2 puffs into the lungs 2 (two) times daily. )  . Dexmethylphenidate HCl 40 MG CP24 Take 40 mg by mouth daily.   . famotidine (PEPCID) 20 MG tablet One at bedtime (Patient taking differently: Take 20 mg by mouth at bedtime. )  . ipratropium-albuterol (DUONEB) 0.5-2.5 (3) MG/3ML SOLN Take 3 mLs by nebulization every 6 (six) hours as needed. (Patient taking differently: Take 3 mLs by nebulization every 6 (six) hours as needed (shortness of breath). )  . lamoTRIgine (LAMICTAL) 200 MG tablet Take 200 mg by mouth at bedtime.   Marland Kitchen loratadine (CLARITIN) 10 MG tablet Take 10 mg by mouth daily.  . montelukast (SINGULAIR) 10 MG tablet Take 1 tablet (10 mg total) by mouth at bedtime.  . pantoprazole (PROTONIX) 40 MG tablet TAKE 1 TABLET (40 MG TOTAL) BY MOUTH DAILY. TAKE 30-60 MIN BEFORE FIRST MEAL OF THE DAY  . QUEtiapine (SEROQUEL) 50 MG tablet Take 100 mg by mouth at bedtime.  . risperiDONE (RISPERDAL) 1 MG tablet Take 3 mg by mouth at bedtime.   Marland Kitchen Spacer/Aero-Holding Chambers (AEROCHAMBER MV) inhaler Use as instructed  . venlafaxine XR (EFFEXOR-XR) 150 MG 24 hr capsule Take 150 mg by mouth at bedtime.   . Vitamin D, Ergocalciferol, (DRISDOL) 50000 units CAPS capsule Take 50,000 Units by mouth every Saturday.  Objective:     amb obese wf nad   05/24/2018        204  02/11/2018          197   01/18/2018        199   12/04/2017     191   11/08/17 189 lb (85.7 kg)  10/31/17 187 lb (84.8 kg)  09/18/17 179 lb 0.2 oz (81.2 kg)    Vital signs reviewed - Note on arrival 02 sats  95% on RA     HEENT: full dentures    HEENT: full dentures/ nl  oropharynx. Nl external ear canals without cough reflex -  Mild  bilateral non-specific turbinate edema     NECK :  without JVD/Nodes/TM/ nl carotid upstrokes bilaterally   LUNGS: no acc muscle use,   Mild barrel  contour chest wall with bilateral  Distant bs with classic pseudowheeze that resolves with plm   without cough on insp or exp maneuver and midl  Hyperresonant  to  percussion bilaterally     CV:  RRR  no s3 or murmur or increase in P2, and no edema   ABD:  soft and nontender with pos end  insp Hoover's  in the supine position. No bruits or organomegaly appreciated, bowel sounds nl  MS:   Nl gait/  ext warm without deformities, calf tenderness, cyanosis or clubbing No obvious joint restrictions   SKIN: warm and dry without lesions    NEURO:  alert, approp, nl sensorium with  no motor or cerebellar deficits apparent.        I personally reviewed images and agree with radiology impression as follows:  CXR:   03/07/2018 during flare in ER: No active cardiopulmonary disease.   Labs ordered 05/24/2018  Allergy profile       Assessment

## 2018-05-24 NOTE — Patient Instructions (Addendum)
Please remember to go to the lab department   for your tests - we will call you with the results when they are available.      No change in medications    Please schedule a follow up office visit in 6 weeks, call sooner if needed

## 2018-05-25 ENCOUNTER — Encounter: Payer: Self-pay | Admitting: Internal Medicine

## 2018-05-25 NOTE — Assessment & Plan Note (Signed)
Quit smoking 08/2017  Spirometry 10/31/2017  FEV1 0.7 (30%)  Ratio 51 p Breo in am   - 10/31/2017  Eos = 0.9  - alpha one AT screen 10/31/2017   MS level 121  - 10/31/2017   Walked RA x one lap @ 185 stopped due to sob s desats at fast pace   - 01/18/2018   Walked RA  2 laps @ 263ft each @ fast  pace  stopped due to sob no desat  - Spirometry 01/18/2018  FEV1 1.1 (49%)  Ratio 63 with mild curvature after am spiriva and symbicort   - 02/11/2018    added prednisone as plan D / changed spiriva to smi - PFT's 03/06/18  FEV1 1.02 (43 % ) ratio 0.62  p 27 % improvement from saba p nothing prior to study with DLCO  59/63 % corrects to 77  % for alv volume  With ERV 65  - Singulair 03/14/2018 >>> - Allergy profile 05/24/2018 >  Eos 0.6/  IgE   - 05/24/2018  After extensive coaching inhaler device,  effectiveness =    75% (short ti on both smi but better than hfa)     Symptoms remain difficult to control and strongly suspect she is not consistent with either her maint or prn/ contingency meds with a major anxiety component playing a role as well so rec  Await allergy profile, consider biologic trial but only after make sures she's consistently following standard rx.   No role for maint pred here (her request) due to wt gain/ anxiety contraindications but has pred as Plan D and needs to keep up with refill of it and all meds, both maint and prn.   I had an extended discussion with the patient reviewing all relevant studies completed to date and  lasting 15 to 20 minutes of a 25 minute visit    See device teaching which extended face to face time for this visit.  Each maintenance medication was reviewed in detail including emphasizing most importantly the difference between maintenance and prns and under what circumstances the prns are to be triggered using an action plan format that is not reflected in the computer generated alphabetically organized AVS which I have not found useful in most complex patients,  especially with respiratory illnesses  Please see AVS for specific instructions unique to this visit that I personally wrote and verbalized to the the pt in detail and then reviewed with pt  by my nurse highlighting any  changes in therapy recommended at today's visit to their plan of care.

## 2018-05-27 LAB — RESPIRATORY ALLERGY PROFILE REGION II ~~LOC~~

## 2018-05-27 LAB — INTERPRETATION:

## 2018-05-27 NOTE — Progress Notes (Signed)
Spoke with pt and notified of results per Dr. Wert. Pt verbalized understanding and denied any questions. 

## 2018-06-05 ENCOUNTER — Ambulatory Visit
Admission: RE | Admit: 2018-06-05 | Discharge: 2018-06-05 | Disposition: A | Discharge status: Home / Self Care | Payer: BLUE CROSS/BLUE SHIELD | Source: Ambulatory Visit | Attending: Nurse Practitioner | Admitting: Nurse Practitioner

## 2018-06-05 ENCOUNTER — Other Ambulatory Visit: Payer: Self-pay

## 2018-06-05 DIAGNOSIS — Z1231 Encounter for screening mammogram for malignant neoplasm of breast: Secondary | ICD-10-CM

## 2018-07-03 ENCOUNTER — Ambulatory Visit: Payer: BC Managed Care – PPO | Admitting: Internal Medicine

## 2018-07-03 ENCOUNTER — Other Ambulatory Visit: Payer: Self-pay

## 2018-07-03 ENCOUNTER — Encounter: Payer: Self-pay | Admitting: Internal Medicine

## 2018-07-03 DIAGNOSIS — J449 Chronic obstructive pulmonary disease, unspecified: Secondary | ICD-10-CM | POA: Diagnosis not present

## 2018-07-03 MED ORDER — PREDNISONE 10 MG PO TABS: ORAL_TABLET | ORAL | 2 refills | Status: DC

## 2018-07-03 NOTE — Progress Notes (Signed)
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 Pulmonary Consult    Self referral for COPD. Pt was dxed with COPD 2 6yrs ago. She has been seen in the past by Dr Verdie Mosher. She states she gets tired easily. She gets winded with carrying something heavy and some days with minimal walking. She uses proair once daily on average and neb with albuterol 3-4 x per wk.   Dyspnea:  Best days doe = MMRC3 = can't walk 100 yards even at a slow pace at a flat grade s stopping due to sob  / better p a few hours but highly variable s pattern  Cough: better since stopped smoking Sleep: bed is flat/ 2 pillows / no am flare  SABA use: neb helps the most  rec Plan A = Automatic = symbicort 160 Take 2 puffs first thing in am and then another 2 puffs about 12 hours later.  Work on inhaler technique:  Plan B = Backup Only use your albuterol as a rescue medication Plan C = Crisis - only use your albuterol nebulizer if you first try Plan B and it fails to help  Plan D = Deltasone  If worse despite A thru C >  Try Deltasone = Prednisone 10 mg take  4 each am x 2 days,   2 each am x 2 days,  1 each am x 2 days and stop       11/08/2017  f/u ov/Julies Carmickle re:  GOLD III / no better on symb 160 2bid  Chief Complaint  Patient presents with  . Follow-up    c/o sob with exertion, using rescue inhaler daily.   Dyspnea:  Not really much better, never used prednisone as rec  Cough:  no Sleeping: bed flat 2 pillows  SABA use: about twice daily hfa and neb  02: none  rec Go ahead and take the prednisone you have on hand> a lot better and maintained on symb 160 2bid   Work on inhaler technique:  Only use your albuterol as a rescue medication            01/18/2018  f/u ov/Mona Ayars re:  Copd GOLD III  symbicort / spiriva handihaler and one day of prednisone Chief Complaint  Patient presents with  . Follow-up    Breathing is okay today. She has had another hospital stay since last visit for increased SOB. She has not use her rescue inhaler or neb since last hospitalization.   Dyspnea:  MMRC2 = can't walk a nl pace on a flat grade s sob but does fine slow and flat  Cough: none Sleeping:  Bed flat/ one pillow  SABA use: none now   02: none  Last flare occurred at work per H&P notes and she did not respond initially to saba  rec Plan A = Automatic = symbicort 160  Take 2 puffs and spiriva  first thing when you wake up  and then another 2 puffs of symbicort about 12 hours later. Also add Pantoprazole (protonix) 40 mg   Take  30-60 min before first meal of the day and Pepcid (famotidine)  20 mg one @  bedtime until return to office - this is the best way to tell whether stomach acid is contributing to your problem.   Work on inhaler technique:  Plan B = Backup Only use your albuterol as a rescue medication Plan C = Crisis - only use your albuterol nebulizer if you first try Plan B and it fails to help > ok to use the nebulizer up to every 4 hours but if start needing it regularly call for immediate appointment Plan D = Deltasone  If worse despite A thru C >  Try Deltasone = Prednisone 10 mg take  4 each am x 2 days,   2 each am x 2 days,  1 each am x 2 days and stop    GERD (REFLUX)  is an extremely common cause of respiratory symptoms just like yours , many times with no obvious heartburn at all.  Please keep appt for pfts  with all medications /inhalers/ solutions in  hand so we can verify exactly what you are taking. This includes all medications from all doctors and over the counters       05/24/2018  f/u ov/Taiwo Fish re: ACOS with increased eos Dyspnea:  Every 3 rd week p stops pred feels worse despite symbicort/singulair/ spriva  Cough: spells of dry cough s specific trigger  Sleeping: blocks under bed and 2 pillows never noct spells SABA use: none in 48 h though says this is a bad week and her symbicort and ppi have run out and hasn't picked them up, unsure of how many doses of either she's missed  02: prn  rec Please remember to go to the lab department   for your tests - we will call you with the results when they are available. No change in medications  Please schedule a follow up office visit in 6 weeks, call sooner if needed     07/03/2018  f/u ov/Shawntay Prest re:  ACOS / freq prednisone need despite sym 160/spiriva smi and singulair max rx for gerd and now 100% adherent as far as can be detected  Chief Complaint  Patient presents with  . Follow-up    Started having increased SOB approx 1 wk ago- had to use neb and rescue inhaler and so started on prednisone and currently taking 30 mg- breathing has improved some.   Dyspnea:  On prednisone MMRC2 = can't walk a nl pace on a flat grade s sob but does fine slow and flat  And off prednisone x sev weeks  > sob at rest so presently back on pred still 30 mg daily  Cough: more at work / min prod mucoid  Sleeping: ok cool room / bed blocks  SABA use: none  02: only when flaring   No obvious day to day or daytime variability or assoc excess/ purulent sputum or mucus plugs or hemoptysis or cp or chest tightness, subjective wheeze or overt sinus or hb symptoms.   Sleeping as above without nocturnal  or early am exacerbation  of respiratory  c/o's or need for noct saba. Also denies any obvious fluctuation of symptoms with weather or environmental changes or other aggravating or alleviating factors except as  outlined above  No unusual exposure hx or h/o childhood pna/ asthma or knowledge of premature birth.  Current Allergies, Complete Past Medical History, Past Surgical History, Family History, and Social History were reviewed in [employer] record.  ROS  The following are not active complaints unless bolded Hoarseness, sore throat, dysphagia, dental problems, itching, sneezing,  nasal congestion or discharge of excess mucus or purulent secretions, ear ache,   fever, chills, sweats, unintended wt loss or wt gain, classically pleuritic or exertional cp,  orthopnea pnd or arm/hand swelling  or leg swelling, presyncope, palpitations, abdominal pain, anorexia, nausea, vomiting, diarrhea  or change in bowel habits or change in bladder habits, change in stools or change in urine, dysuria, hematuria,  rash, arthralgias, visual complaints, headache, numbness, weakness or ataxia or problems with walking or coordination,  change in mood or  memory.        Current Meds  Medication Sig  . albuterol (PROAIR HFA) 108 (90 Base) MCG/ACT inhaler Inhale 2 puffs into the lungs every 6 (six) hours as needed for wheezing or shortness of breath.  Marland Kitchen albuterol (PROVENTIL) (2.5 MG/3ML) 0.083% nebulizer solution Take 3 mLs (2.5 mg total) by nebulization every 6 (six) hours as needed for wheezing or shortness of breath.  . ALPRAZolam (XANAX) 0.25 MG tablet Take 0.25 mg by mouth daily as needed for anxiety.   Marland Kitchen atorvastatin (LIPITOR) 20 MG tablet Take 20 mg by mouth at bedtime.  . bismuth subsalicylate (PEPTO BISMOL) 262 MG chewable tablet Chew 262-524 mg by mouth daily as needed for diarrhea or loose stools.  . budesonide-formoterol (SYMBICORT) 160-4.5 MCG/ACT inhaler Inhale 2 puffs into the lungs 2 (two) times daily.  Marland Kitchen Dexmethylphenidate HCl 40 MG CP24 Take 40 mg by mouth daily.   . famotidine (PEPCID) 20 MG tablet One at bedtime (Patient taking differently: Take 20 mg by mouth at bedtime. )  .  lamoTRIgine (LAMICTAL) 200 MG tablet Take 200 mg by mouth at bedtime.   Marland Kitchen loratadine (CLARITIN) 10 MG tablet Take 10 mg by mouth daily.  . montelukast (SINGULAIR) 10 MG tablet Take 1 tablet (10 mg total) by mouth at bedtime.  . pantoprazole (PROTONIX) 40 MG tablet TAKE 1 TABLET (40 MG TOTAL) BY MOUTH DAILY. TAKE 30-60 MIN BEFORE FIRST MEAL OF THE DAY  . predniSONE (DELTASONE) 10 MG tablet TAKE AS PRESCRIBED ON AVS  . QUEtiapine (SEROQUEL) 50 MG tablet Take 100 mg by mouth at bedtime.  . risperiDONE (RISPERDAL) 1 MG tablet Take 3 mg by mouth at bedtime.   Marland Kitchen Spacer/Aero-Holding Chambers (AEROCHAMBER MV) inhaler Use as instructed  . Tiotropium Bromide Monohydrate (SPIRIVA RESPIMAT) 2.5 MCG/ACT AERS Inhale 2 puffs into the lungs daily.  Marland Kitchen venlafaxine XR (EFFEXOR-XR) 150 MG 24 hr capsule Take 150 mg by mouth at bedtime.   . Vitamin D, Ergocalciferol, (DRISDOL) 50000 units CAPS capsule Take 50,000 Units by mouth every Saturday.                Objective:     amb obese wf nad  07/03/2018        205  05/24/2018        204  02/11/2018          197   01/18/2018        199   12/04/2017     191   11/08/17 189 lb (85.7 kg)  10/31/17 187 lb (84.8 kg)  09/18/17 179 lb 0.2 oz (81.2 kg)       Vital signs  reviewed - Note on arrival 02 sats  96% on RA      HEENT:full dentures with nl oropharynx. Nl external ear canals without cough reflex -  Mild bilateral non-specific turbinate edema     NECK :  without JVD/Nodes/TM/ nl carotid upstrokes bilaterally   LUNGS: no acc muscle use,  Min barrel  contour chest wall with bilateral  slightly decreased bs s audible wheeze and  without cough on insp or exp maneuver and min  Hyperresonant  to  percussion bilaterally     CV:  RRR  no s3 or murmur or increase in P2, and no edema   ABD:  soft and nontender with pos end  insp Hoover's  in the supine position. No bruits or organomegaly appreciated, bowel sounds nl  MS:   Nl gait/  ext warm without  deformities, calf tenderness, cyanosis or clubbing No obvious joint restrictions   SKIN: warm and dry without lesions    NEURO:  alert, approp, nl sensorium with  no motor or cerebellar deficits apparent.                 Assessment

## 2018-07-03 NOTE — Patient Instructions (Signed)
We will see if we can qualify you for Fasenra injections   Work on inhaler technique:  relax and gently blow all the way out then take a nice smooth deep breath back in, triggering the inhaler at same time you start breathing in.  Hold for up to 5 seconds if you can. Blow symbicort  out thru nose. Rinse and gargle with water when done     No change in medications  Please schedule a follow up visit in 3 months but call sooner if needed  with all medications /inhalers/ solutions in hand so we can verify exactly what you are taking. This includes all medications from all doctors and over the counters

## 2018-07-04 ENCOUNTER — Encounter: Payer: Self-pay | Admitting: Internal Medicine

## 2018-07-04 ENCOUNTER — Telehealth: Payer: Self-pay | Admitting: Internal Medicine

## 2018-07-04 ENCOUNTER — Other Ambulatory Visit: Payer: Self-pay | Admitting: Internal Medicine

## 2018-07-04 NOTE — Assessment & Plan Note (Signed)
Quit smoking 08/2017  Spirometry 10/31/2017  FEV1 0.7 (30%)  Ratio 51 p Breo in am   - 10/31/2017  Eos = 0.9  - alpha one AT screen 10/31/2017   MS level 121  - 10/31/2017   Walked RA x one lap @ 185 stopped due to sob s desats at fast pace   - 01/18/2018   Walked RA  2 laps @ 261ft each @ fast  pace  stopped due to sob no desat  - Spirometry 01/18/2018  FEV1 1.1 (49%)  Ratio 63 with mild curvature after am spiriva and symbicort   - 02/11/2018    added prednisone as plan D / changed spiriva to smi - PFT's 03/06/18  FEV1 1.02 (43 % ) ratio 0.62  p 27 % improvement from saba p nothing prior to study with DLCO  59/63 % corrects to 77  % for alv volume  With ERV 65  - Singulair 03/14/2018 >>> - Allergy profile 05/24/2018 >  Eos 0.6/  IgE 25 RAST neg    - 07/03/2018  After extensive coaching inhaler device,  effectiveness =    90% finally and still flares w/in a week or two off prednisone so rec fasenra trial and continue pred with ceiling of 20 and floor of 0 as plan D  Give the eosinophilia in absence of atopic serology as well as are clear and very striking variability in terms of airflow documented also on spirometry she is a textbook picture for ACOS for which there is no standard in terms of state-of-the-art and treatment nor any literature to go to to support one regimen over another.  Given the fact that she has such high eosinophil levels for Katharine Look would be a good option if we can get her approved by her insurance.   I had an extended discussion with the patient reviewing all relevant studies completed to date and  lasting 15 to 20 minutes of a 25 minute visit    See device teaching which extended face to face time for this visit.  Each maintenance medication was reviewed in detail including emphasizing most importantly the difference between maintenance and prns and under what circumstances the prns are to be triggered using an action plan format that is not reflected in the computer generated  alphabetically organized AVS which I have not found useful in most complex patients, especially with respiratory illnesses  Please see AVS for specific instructions unique to this visit that I personally wrote and verbalized to the the pt in detail and then reviewed with pt  by my nurse highlighting any  changes in therapy recommended at today's visit to their plan of care.

## 2018-07-04 NOTE — Telephone Encounter (Signed)
Received [name] enrollment forms from [place], [place]. Forms have been faxed to [facility] [id number]. Will await summary of benefits.

## 2018-07-05 ENCOUNTER — Ambulatory Visit: Payer: BLUE CROSS/BLUE SHIELD | Admitting: Internal Medicine

## 2018-07-10 NOTE — Telephone Encounter (Signed)
As of today, I have not received the pt's summary of benefits. Called [employer] at [phone]. They are going to refax the pt's summary of benefits to me, they had an incorrect fax number. Will await summary of benefits.

## 2018-07-15 NOTE — Telephone Encounter (Signed)
Summary of benefits have been received. A form was attached to initiate the predetermination through the pt's medical plan. This form has been filled out and faxed in, along with clinical information. Will await determination.

## 2018-07-18 NOTE — Telephone Encounter (Signed)
Ronalee Belts from [place] states needs dosing information for [employer] injection.  Ronalee Belts phone number is [phone].

## 2018-07-19 NOTE — Telephone Encounter (Signed)
Spoke with Ronalee Belts from [place]. He has been given the [place] dosing information. Will await PA decision.

## 2018-07-26 NOTE — Telephone Encounter (Signed)
Contacted Mike with [employer]. States that pt's [name] has been approved 07/15/2018 - 07/15/2019. Medication will be coming from [place] per the pt's summary of benefits. Contacted [facility] at [phone] to set up shipment. Was advised that they would need speak to the pt to complete her enrollment and give consent to ship. Left message with the pt to call me back so that I may go over this information with her.

## 2018-07-29 NOTE — Telephone Encounter (Signed)
Pt works 3rd shift and prefers early am call if at all possible. Went over previous message with patient. She will try to get in touch with [facility] to schedule delivery. She will await call back.

## 2018-07-29 NOTE — Telephone Encounter (Signed)
Pt returning call and can be reached @ [phone].Julia Daniels

## 2018-08-01 NOTE — Telephone Encounter (Signed)
Called and spoke with Ardelle Lesches [place], [place].  Made Kinzie aware Ria Comment is out of the office until Monday.  Kinzie asked if Fasenra injections have been scheduled and started.  Per epic, no injection appointments set up.   Kinzie stated [facility] can be called and schedule patient shipment. [facility] number, [phone].  Will route message to injection pool to be followed up next week

## 2018-08-01 NOTE — Telephone Encounter (Signed)
Julia Daniels from [employer]  Want's to talk to [place] or [place] about [name]   [phone]

## 2018-08-02 NOTE — Telephone Encounter (Signed)
Attempted to call to set up deliver for fasenra.  The [facility] number [phone] will disconnect after several rings.  Called the previous number to [facility] that Ria Comment used [phone] and was placed on hold with extended wait times. Unable to schedule delivery due to call not answered.

## 2018-08-08 NOTE — Telephone Encounter (Signed)
Called [facility] @ [phone] and was transferred to the pharmacy.  Spoke with Sydell at [facility] to set up shipment.  Per Sydell they need to speak with patient first to verify patient information (medication allergies, insurance information, etc).  Per Sydell patient may call [phone].  LMOM TCB x1 for patient to discuss the above.

## 2018-08-09 ENCOUNTER — Telehealth: Payer: Self-pay | Admitting: Internal Medicine

## 2018-08-09 NOTE — Telephone Encounter (Signed)
See 08/09/2018 phone note for details Nothing further needed; will sign off

## 2018-08-09 NOTE — Telephone Encounter (Signed)
Rinaldo Ratel, [place]  Certified Medical Assistant    Telephone Encounter  Signed  Encounter Date:  07/04/2018          Signed         Show:Clear all [x] Manual[] Template[] Copied  Added by: [x] Rinaldo Ratel, CMA  [] Hover for details Called [facility] @ [phone] and was transferred to the pharmacy.  Spoke with Sydell at [facility] to set up shipment.  Per Sydell they need to speak with patient first to verify patient information (medication allergies, insurance information, etc).  Per Sydell patient may call [phone].  LMOM TCB x1 for patient to discuss the above.        Electronically signed by Rinaldo Ratel, CMA at 08/08/2018 12:12 PM   Telephone on 07/04/2018     Detailed Report  Spoke with pt and gave her the information to call [facility]. Pt understood and would give them a call so they can ship her medication. Nothing further is needed.

## 2018-08-16 ENCOUNTER — Telehealth: Payer: Self-pay | Admitting: Internal Medicine

## 2018-08-16 MED ORDER — SPIRIVA RESPIMAT 2.5 MCG/ACT IN AERS: 2.0000 | INHALATION_SPRAY | Freq: Every day | RESPIRATORY_TRACT | 0 refills | Status: DC

## 2018-08-16 NOTE — Telephone Encounter (Signed)
Called and spoke w/ pt. Pt states she her next Spiriva prescription will not arrive until August 24th and is requesting a sample of Spiriva to hold her over until then. Pt states she just ran out of her last dose and will need another for tonight. I let her know we could provide her with two samples. Pt expressed understanding and stated she would come by the office today to pick up her samples. Pt is NEGATIVE for COVID-19 screening questions. Two samples of Spiriva 2.5 have been placed up front in a bag with pt's identifiers and today's date. Nothing further needed at this time.

## 2018-08-22 ENCOUNTER — Telehealth: Payer: Self-pay | Admitting: Internal Medicine

## 2018-08-22 MED ORDER — EPINEPHRINE 0.3 MG/0.3ML IJ SOAJ: 0.3000 mg | Freq: Once | INTRAMUSCULAR | 5 refills | Status: AC

## 2018-08-22 NOTE — Telephone Encounter (Signed)
Called and spoke with Patient.  Patient stated she has received her [name] from [facility].  Patient requested to schedule appointment for [facility] injection.  Injection scheduled at 0900, 08/28/18. Patient aware of 2 hours observation after injection.  Epipen prescription sent per protocol to [place]. Patient aware to bring Epipen to all injections.  Nothing further at this time.

## 2018-08-28 ENCOUNTER — Ambulatory Visit (INDEPENDENT_AMBULATORY_CARE_PROVIDER_SITE_OTHER): Payer: BC Managed Care – PPO

## 2018-08-28 ENCOUNTER — Other Ambulatory Visit: Payer: Self-pay

## 2018-08-28 DIAGNOSIS — J449 Chronic obstructive pulmonary disease, unspecified: Secondary | ICD-10-CM | POA: Diagnosis not present

## 2018-08-28 MED ORDER — BENRALIZUMAB 30 MG/ML ~~LOC~~ SOSY
30.0000 mg | PREFILLED_SYRINGE | Freq: Once | SUBCUTANEOUS | Status: AC
  Administered 2018-08-28: 09:00:00 30 mg via SUBCUTANEOUS

## 2018-08-28 NOTE — Progress Notes (Signed)
Have you been hospitalized within the last 10 days?  No Do you have a fever?  No Do you have a cough?  No Do you have a headache or sore throat? No Do you have your Epi Pen visible and is it within date?  Yes   Patient instructed and showed  how to use Epipen with demo.  Understanding stated.  0900-Patient presented to the office today for first-time Fasenra injection.  Primary Pulmonologist: Christinia Gully MD Medication name: [name] Strength: 30mg  Site(s): Left arm  Epi pen/Auvi-Q visible during appointment: Yes  Time of injection: 0905  Patient evaluated every 15-20 minutes per protocol x2 hours.  1st check: 0920AM Evaluation: denies any sob, itching, swelling, feeling flushed, or other adverse effects. Epipen within reach.  2nd check: 0940 AM Evaluation:denies itching, pain at injection site, sob, or other adverse effects. Epipen within reach.  3rd check: 0955 AM Evaluation: denies sob, itching, pain at injection site, or other side effects. Epipen within reach.  4th check: 1015 AM Evaluation: denies sob, itching, no pain or redness at injection site, or any other adverse effects. Epipen within reach.  5th check: 1030AM Evaluation: denies sob,itching, no redness at site, or other adverse effects. Epipen within reach.  6th check: 1045AM Evaluation:denies sob, itching, no redness at injection site, or other adverse effects. Epipen within reach.  7th check: 1100AM Evaluation: denies sob, itching, no redness, or irritation at injection site, or other adverse effects. Epipen within reach.  8th check: 1105AM Evaluation:Denies any sob, no itching, injection site checked, no redness, or tenderness, or other adverse effects. Epipen within reach.  Epipen demonstration given a second time.  Patient demonstrated Epipen use with demo provided. Importance of keeping Epipen with her at all times explained. Signs of adverse effects gone over. Understanding stated.

## 2018-09-04 MED ORDER — BENRALIZUMAB 30 MG/ML ~~LOC~~ SOSY
30.0000 mg | PREFILLED_SYRINGE | Freq: Once | SUBCUTANEOUS | Status: AC
  Administered 2018-08-28: 09:00:00 30 mg via SUBCUTANEOUS

## 2018-09-04 NOTE — Addendum Note (Signed)
Addended by: Elton Sin on: 09/04/2018 05:21 PM   Modules accepted: Orders

## 2018-09-11 DIAGNOSIS — J449 Chronic obstructive pulmonary disease, unspecified: Secondary | ICD-10-CM | POA: Diagnosis not present

## 2018-09-12 DIAGNOSIS — F3175 Bipolar disorder, in partial remission, most recent episode depressed: Secondary | ICD-10-CM | POA: Diagnosis not present

## 2018-09-17 ENCOUNTER — Telehealth: Payer: Self-pay | Admitting: Internal Medicine

## 2018-09-17 DIAGNOSIS — J4550 Severe persistent asthma, uncomplicated: Secondary | ICD-10-CM | POA: Diagnosis not present

## 2018-09-17 NOTE — Telephone Encounter (Signed)
[name] Order: 30mg  #1 prefilled syringe Ordered date: 09/17/2018 Expected date of arrival: 09/19/2018 Ordered by: Desmond Dike, [place]  [facility]ty [facility]

## 2018-09-19 NOTE — Telephone Encounter (Signed)
[name] Shipment Received:  30mg  #1 prefilled syringe Medication arrival date: 09/19/2018 Lot #: [id number] Exp date: 10/2019 Received by: Desmond Dike, [place]

## 2018-09-25 ENCOUNTER — Ambulatory Visit (INDEPENDENT_AMBULATORY_CARE_PROVIDER_SITE_OTHER): Payer: BC Managed Care – PPO

## 2018-09-25 ENCOUNTER — Other Ambulatory Visit: Payer: Self-pay | Admitting: Internal Medicine

## 2018-09-25 ENCOUNTER — Other Ambulatory Visit: Payer: Self-pay

## 2018-09-25 DIAGNOSIS — J449 Chronic obstructive pulmonary disease, unspecified: Secondary | ICD-10-CM | POA: Diagnosis not present

## 2018-09-25 MED ORDER — BENRALIZUMAB 30 MG/ML ~~LOC~~ SOSY: 30.0000 mg | PREFILLED_SYRINGE | Freq: Once | SUBCUTANEOUS | Status: DC

## 2018-09-25 MED ORDER — BENRALIZUMAB 30 MG/ML ~~LOC~~ SOSY
30.0000 mg | PREFILLED_SYRINGE | Freq: Once | SUBCUTANEOUS | Status: AC
  Administered 2018-09-25: 30 mg via SUBCUTANEOUS

## 2018-09-25 NOTE — Progress Notes (Signed)
Have you been hospitalized within the last 10 days?  No Do you have a fever?  No Do you have a cough?  No Do you have a headache or sore throat? No Do you have your Epi Pen visible and is it within date?  Yes   Patient presented to the office today for second-time Fasenra injection.  Primary Pulmonologist: Christinia Gully MD Medication name: [name] Strength: 30mg /ml Site(s): right arm  Epi pen/Auvi-Q visible during appointment: Yes  Time of injection: [id number]  Patient evaluated every 5 minutes per protocol x 20-30 minutes.  1st check: 0855AM Evaluation: No redness at injection site, denies pain, sob, wheezing, or other signs of distress.  2nd check: 0900AM Evaluation:No sob, wheezing, or other signs of distress.  Denies pain at injection site.  No redness at injection site.  3rd check: 0905AM Evaluation: No sob, wheezing, or other signs of distress.  No redness at injection site.   4th check: 0910AM Evaluation:No sob, wheezing, or other signs of distress. Denies pain at injection site.  No redness at injection site.  5th check: 0915AM Evaluation:No sob, wheezing, or other signs of distress.  Denies pain or discomfort at injection site.  No redness at injection site.  Patient denies any discomfort or pain at injection site.  Patient denies any signs or symptoms of distress.  Patient instruction given  and Patient demonstration on Epipen administration. Nothing further at this time.

## 2018-10-03 ENCOUNTER — Encounter: Payer: Self-pay | Admitting: Internal Medicine

## 2018-10-03 ENCOUNTER — Other Ambulatory Visit: Payer: Self-pay

## 2018-10-03 ENCOUNTER — Ambulatory Visit: Payer: BC Managed Care – PPO | Admitting: Internal Medicine

## 2018-10-03 DIAGNOSIS — Z23 Encounter for immunization: Secondary | ICD-10-CM | POA: Diagnosis not present

## 2018-10-03 DIAGNOSIS — J449 Chronic obstructive pulmonary disease, unspecified: Secondary | ICD-10-CM

## 2018-10-03 NOTE — Patient Instructions (Signed)
Stop your singulair when you finish the bottle, ok to resume if you notice any worse respiratory symptoms   Please schedule a follow up visit in 3 months but call sooner if needed

## 2018-10-03 NOTE — Progress Notes (Signed)
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 Pulmonary Consult    Self referral for COPD. Pt was dxed with COPD 2 47yrs ago. She has been seen in the past by Dr Verdie Mosher. She states she gets tired easily. She gets winded with carrying something heavy and some days with minimal walking. She uses proair once daily on average and neb with albuterol 3-4 x per wk.   Dyspnea:  Best days doe = MMRC3 = can't walk 100 yards even at a slow pace at a flat grade s stopping due to sob  / better p a few hours but highly variable s pattern  Cough: better since stopped smoking Sleep: bed is flat/ 2 pillows / no am flare  SABA use: neb helps the most  rec Plan A = Automatic = symbicort 160 Take 2 puffs first thing in am and then another 2 puffs about 12 hours later.  Work on inhaler technique:  Plan B = Backup Only use your albuterol as a rescue medication Plan C = Crisis - only use your albuterol nebulizer if you first try Plan B and it fails to help  Plan D = Deltasone  If worse despite A thru C >  Try Deltasone = Prednisone 10 mg take  4 each am x 2 days,   2 each am x 2 days,  1 each am x 2 days and stop       11/08/2017  f/u ov/Julia Daniels re:  GOLD III / no better on symb 160 2bid  Chief Complaint  Patient presents with  . Follow-up    c/o sob with exertion, using rescue inhaler daily.   Dyspnea:  Not really much better, never used prednisone as rec  Cough:  no Sleeping: bed flat 2 pillows  SABA use: about twice daily hfa and neb  02: none  rec Go ahead and take the prednisone you have on hand> a lot better and maintained on symb 160 2bid   Work on inhaler technique:  Only use your albuterol as a rescue medication        01/18/2018  f/u ov/Julia Daniels re:  Copd GOLD III  symbicort / spiriva handihaler  Still on pred/ last day Chief Complaint  Patient presents with  . Follow-up    Breathing is okay today. She has had another hospital stay since last visit for increased SOB. She has not use her rescue inhaler or neb since last hospitalization.   Dyspnea:  MMRC2 = can't walk a nl pace on a flat grade s sob but does fine slow and flat  Cough: none Sleeping:  Bed flat/ one pillow  SABA use: none now   02: none  Last flare occurred at work per H&P notes and she did not respond initially to saba  rec Plan A = Automatic = symbicort 160 Take 2 puffs  and spiriva  first thing when you wake up  and then another 2 puffs of symbicort about 12 hours later. Also add Pantoprazole (protonix) 40 mg   Take  30-60 min before first meal of the day and Pepcid (famotidine)  20 mg one @  bedtime until return to office - this is the best way to tell whether stomach acid is contributing to your problem.   Work on inhaler technique:  Plan B = Backup Only use your albuterol as a rescue medication Plan C = Crisis - only use your albuterol nebulizer if you first try Plan B and it fails to help > ok to use the nebulizer up to every 4 hours but if start needing it regularly call for immediate appointment Plan D = Deltasone  If worse despite A thru C >  Try Deltasone = Prednisone 10 mg take  4 each am x 2 days,   2 each am x 2 days,  1 each am x 2 days and stop    GERD (REFLUX)  is an extremely common cause of respiratory symptoms just like yours , many times with no obvious heartburn at all.  Please keep appt for pfts  with all medications /inhalers/ solutions in hand so we  can verify exactly what you are taking. This includes all medications from all doctors and over the counters        07/03/2018  f/u ov/Julia Daniels re:  ACOS / freq prednisone need despite sym 160/spiriva smi and singulair max rx for gerd and now 100% adherent as far as can be detected  Chief Complaint  Patient presents with  . Follow-up    Started having increased SOB approx 1 wk ago- had to use neb and rescue inhaler and so started on prednisone and currently taking 30 mg- breathing has improved some.   Dyspnea:  On prednisone MMRC2 = can't walk a nl pace on a flat grade s sob but does fine slow and flat  And off prednisone x sev weeks  > sob at rest so presently back on pred still 30 mg daily  Cough: more at work / min prod mucoid  Sleeping: ok cool room / bed blocks  SABA use: none  02: only when flaring rec We will see if we can qualify you for Fasenra injections > started 08/28/18 Work on inhaler technique:      10/03/2018  f/u ov/Julia Daniels re: ACOS  Chief Complaint  Patient presents with  . Follow-up    Breathing is much improved on Fascenra. She rarely uses her albuterol inhaler or neb.   Dyspnea:  MMRC2 = can't walk a nl pace on a flat grade s sob but does fine slow and flat  Cough: still some spells  Sleeping: fine SABA use: none 02: not using    No obvious day to day or daytime variability or assoc excess/ purulent sputum or mucus plugs or hemoptysis or cp or chest tightness, subjective wheeze or overt sinus or hb symptoms.   without nocturnal  or early am exacerbation  of respiratory  c/o's or need for noct saba. Also denies any obvious fluctuation of symptoms with weather or environmental changes or other aggravating or alleviating factors except as outlined above   No unusual exposure hx or h/o childhood pna/ asthma or knowledge of premature birth.  Current Allergies, Complete Past Medical History, Past Surgical History, Family History, and Social History were reviewed in  [employer] record.  ROS  The  following are not active complaints unless bolded Hoarseness, sore throat, dysphagia, dental problems, itching, sneezing,  nasal congestion or discharge of excess mucus or purulent secretions, ear ache,   fever, chills, sweats, unintended wt loss or wt gain, classically pleuritic or exertional cp,  orthopnea pnd or arm/hand swelling  or leg swelling, presyncope, palpitations, abdominal pain, anorexia, nausea, vomiting, diarrhea  or change in bowel habits or change in bladder habits, change in stools or change in urine, dysuria, hematuria,  rash, arthralgias, visual complaints, headache, numbness, weakness or ataxia or problems with walking or coordination,  change in mood or  memory.        Current Meds  Medication Sig  . albuterol (PROAIR HFA) 108 (90 Base) MCG/ACT inhaler Inhale 2 puffs into the lungs every 6 (six) hours as needed for wheezing or shortness of breath.  Marland Kitchen albuterol (PROVENTIL) (2.5 MG/3ML) 0.083% nebulizer solution Take 3 mLs (2.5 mg total) by nebulization every 6 (six) hours as needed for wheezing or shortness of breath.  . ALPRAZolam (XANAX) 0.25 MG tablet Take 0.25 mg by mouth daily as needed for anxiety.   Marland Kitchen atorvastatin (LIPITOR) 20 MG tablet Take 20 mg by mouth at bedtime.  . bismuth subsalicylate (PEPTO BISMOL) 262 MG chewable tablet Chew 262-524 mg by mouth daily as needed for diarrhea or loose stools.  . budesonide-formoterol (SYMBICORT) 160-4.5 MCG/ACT inhaler Inhale 2 puffs into the lungs 2 (two) times daily.  Marland Kitchen Dexmethylphenidate HCl 40 MG CP24 Take 40 mg by mouth daily.   . famotidine (PEPCID) 20 MG tablet One at bedtime (Patient taking differently: Take 20 mg by mouth at bedtime. )  . FASENRA 30 MG/ML SOSY INJECT 1 SYRINGE UNDER THE SKIN EVERY 4 WEEKS FOR 3 DOSES, FOLLOWED BY1 SYRINGE EVERY 8 WEEKS THEREAFTER.  Marland Kitchen lamoTRIgine (LAMICTAL) 200 MG tablet Take 200 mg by mouth at bedtime.   Marland Kitchen loratadine (CLARITIN) 10 MG  tablet Take 10 mg by mouth daily.  . montelukast (SINGULAIR) 10 MG tablet Take 1 tablet (10 mg total) by mouth at bedtime.  . pantoprazole (PROTONIX) 40 MG tablet TAKE 1 TABLET (40 MG TOTAL) BY MOUTH DAILY. TAKE 30-60 MIN BEFORE FIRST MEAL OF THE DAY  . predniSONE (DELTASONE) 10 MG tablet 2 each on waking up  until better then 1 daily x one week and stop  . QUEtiapine (SEROQUEL) 50 MG tablet Take 100 mg by mouth at bedtime.  . risperiDONE (RISPERDAL) 1 MG tablet Take 3 mg by mouth at bedtime.   Marland Kitchen Spacer/Aero-Holding Chambers (AEROCHAMBER MV) inhaler Use as instructed  . Tiotropium Bromide Monohydrate (SPIRIVA RESPIMAT) 2.5 MCG/ACT AERS Inhale 2 puffs into the lungs daily.  . Tiotropium Bromide Monohydrate (SPIRIVA RESPIMAT) 2.5 MCG/ACT AERS Inhale 2 puffs into the lungs daily.  Marland Kitchen venlafaxine XR (EFFEXOR-XR) 150 MG 24 hr capsule Take 150 mg by mouth at bedtime.   . Vitamin D, Ergocalciferol, (DRISDOL) 50000 units CAPS capsule Take 50,000 Units by mouth every Saturday.                   Objective:     Obese amb wf nad   10/03/2018        199  07/03/2018        205  05/24/2018        204  02/11/2018          197   01/18/2018        199   12/04/2017     191   11/08/17  189 lb (85.7 kg)  10/31/17 187 lb (84.8 kg)  09/18/17 179 lb 0.2 oz (81.2 kg)    Vital signs reviewed - Note on arrival 02 sats  967% on RA      reported:full dentures      HEENT : pt wearing mask not removed for exam due to covid - 19 concerns.    NECK :  without JVD/Nodes/TM/ nl carotid upstrokes bilaterally   LUNGS: no acc muscle use,  Mild barrel  contour chest wall with bilateral  Distant bs s audible wheeze and  without cough on insp or exp maneuvers  and mild  Hyperresonant  to  percussion bilaterally     CV:  RRR  no s3 or murmur or increase in P2, and no edema   ABD:  soft and nontender with pos end  insp Hoover's  in the supine position. No bruits or organomegaly appreciated, bowel sounds nl  MS:    Nl gait/  ext warm without deformities, calf tenderness, cyanosis or clubbing No obvious joint restrictions   SKIN: warm and dry without lesions    NEURO:  alert, approp, nl sensorium with  no motor or cerebellar deficits apparent.               Assessment

## 2018-10-03 NOTE — Assessment & Plan Note (Addendum)
Quit smoking 08/2017  Spirometry 10/31/2017  FEV1 0.7 (30%)  Ratio 51 p Breo in am   - 10/31/2017  Eos = 0.9  - alpha one AT screen 10/31/2017   MS level 121  - 10/31/2017   Walked RA x one lap @ 185 stopped due to sob s desats at fast pace   - 01/18/2018   Walked RA  2 laps @ 232ft each @ fast  pace  stopped due to sob no desat  - Spirometry 01/18/2018  FEV1 1.1 (49%)  Ratio 63 with mild curvature after am spiriva and symbicort  - 02/11/2018    added prednisone as plan D / changed spiriva to smi - PFT's 03/06/18  FEV1 1.02 (43 % ) ratio 0.62  p 27 % improvement from saba p nothing prior to study with DLCO  59/63 % corrects to 77  % for alv volume  With ERV 65  - Singulair 03/14/2018 >>> try off 10/10/2018  - Allergy profile 05/24/2018 >  Eos 0.6/  IgE 25 RAST neg    - 07/03/2018  After extensive coaching inhaler device,  effectiveness =    90% finally and still flares w/in a week or two off prednisone so rec fasenra trial and continue pred with ceiling of 20 and floor of 0 as plan D -  08/28/18 started fasenra > marked clinical improvement   - The proper method of use, as well as anticipated side effects, of a metered-dose inhaler are discussed and demonstrated to the patient.      Still some doe but no longer needing pred q o weeks so ok to try off singulair but no other changes for now    I had an extended discussion with the patient reviewing all relevant studies completed to date and  lasting 15 to 20 minutes of a 25 minute visit    Each maintenance medication was reviewed in detail including most importantly the difference between maintenance and prns and under what circumstances the prns are to be triggered using an action plan format that is not reflected in the computer generated alphabetically organized AVS.   I performed device teaching  using a teach back technique which also  extended face to face time for this visit (see above)     Please see AVS for specific instructions unique to  this visit that I personally wrote and verbalized to the the pt in detail and then reviewed with pt  by my nurse highlighting any  changes in therapy recommended at today's visit to their plan of care.

## 2018-10-11 DIAGNOSIS — J449 Chronic obstructive pulmonary disease, unspecified: Secondary | ICD-10-CM | POA: Diagnosis not present

## 2018-10-14 ENCOUNTER — Telehealth: Payer: Self-pay | Admitting: Internal Medicine

## 2018-10-15 DIAGNOSIS — J4550 Severe persistent asthma, uncomplicated: Secondary | ICD-10-CM | POA: Diagnosis not present

## 2018-10-15 NOTE — Telephone Encounter (Signed)
[name] Order: 30mg  #1 prefilled syringe Ordered date: 10/15/18 Expected date of arrival: 10/16/18 Ordered by: [place]: [facility]

## 2018-10-16 ENCOUNTER — Other Ambulatory Visit: Payer: Self-pay | Admitting: *Deleted

## 2018-10-16 DIAGNOSIS — F3175 Bipolar disorder, in partial remission, most recent episode depressed: Secondary | ICD-10-CM | POA: Diagnosis not present

## 2018-10-16 MED ORDER — FASENRA 30 MG/ML ~~LOC~~ SOSY: 30.0000 mg | PREFILLED_SYRINGE | SUBCUTANEOUS | 0 refills | Status: DC

## 2018-10-18 NOTE — Telephone Encounter (Signed)
As of today, we have not received the pt's shipment of Fasenra. Called [facility] and spoke with [place]. They could not explain why the shipment was not received. They are going to reship the package.  [name] Order: 30mg  #1 prefilled syringe Ordered date: 10/18/2018 Expected date of arrival: 10/22/2018 Ordered by: Desmond Dike, [place]  Specialty Pharmacy: [facility]

## 2018-10-23 ENCOUNTER — Ambulatory Visit: Payer: BC Managed Care – PPO

## 2018-10-23 ENCOUNTER — Telehealth: Payer: Self-pay | Admitting: Internal Medicine

## 2018-10-23 ENCOUNTER — Other Ambulatory Visit: Payer: Self-pay

## 2018-10-23 NOTE — Telephone Encounter (Signed)
Patient Julia Daniels injection cancelled.  Julia [facility] pharmacy never delivered Julia Daniels 10/22/18, as scheduled. Called Julia Daniels, spoke with Julia Daniels.  Julia Daniels is scheduled for office delivery 10/24/18. Patient has been notified and scheduled for 10/25/18 at [place]. Will contact Patient is Julia Daniels has not arrived 10/24/18.

## 2018-10-23 NOTE — Telephone Encounter (Signed)
Patient Fasenra injection cancelled.  [facility] never delivered [facility] 10/22/18, as scheduled. Called [facility], spoke with Joellen [place].  Julia Daniels is scheduled for office delivery 10/24/18. Patient scheduled for 10/16/200 at [place].

## 2018-10-23 NOTE — Telephone Encounter (Signed)
Was this shipment received?

## 2018-10-24 NOTE — Telephone Encounter (Signed)
Fasenra Shipment Received:  30mg  #1 prefilled syringe Medication arrival date: 10/24/18 Lot #: [id number] Exp date: 11/10/2019 Received by: Elliot Dally

## 2018-10-25 ENCOUNTER — Other Ambulatory Visit: Payer: Self-pay

## 2018-10-25 ENCOUNTER — Ambulatory Visit (INDEPENDENT_AMBULATORY_CARE_PROVIDER_SITE_OTHER): Payer: BC Managed Care – PPO

## 2018-10-25 DIAGNOSIS — J449 Chronic obstructive pulmonary disease, unspecified: Secondary | ICD-10-CM

## 2018-10-25 MED ORDER — BENRALIZUMAB 30 MG/ML ~~LOC~~ SOSY
30.0000 mg | PREFILLED_SYRINGE | Freq: Once | SUBCUTANEOUS | Status: AC
  Administered 2018-10-25: 30 mg via SUBCUTANEOUS

## 2018-10-25 NOTE — Progress Notes (Signed)
Have you been hospitalized within the last 10 days?  No Do you have a fever?  No Do you have a cough?  No Do you have a headache or sore throat? No Do you have your Epi Pen visible and is it within date?  Yes 

## 2018-11-11 DIAGNOSIS — J449 Chronic obstructive pulmonary disease, unspecified: Secondary | ICD-10-CM | POA: Diagnosis not present

## 2018-11-26 ENCOUNTER — Other Ambulatory Visit: Payer: Self-pay | Admitting: Internal Medicine

## 2018-11-26 DIAGNOSIS — J449 Chronic obstructive pulmonary disease, unspecified: Secondary | ICD-10-CM

## 2018-11-27 DIAGNOSIS — F3175 Bipolar disorder, in partial remission, most recent episode depressed: Secondary | ICD-10-CM | POA: Diagnosis not present

## 2018-12-03 ENCOUNTER — Telehealth: Payer: Self-pay | Admitting: Internal Medicine

## 2018-12-03 MED ORDER — BUDESONIDE-FORMOTEROL FUMARATE 160-4.5 MCG/ACT IN AERO: 2.0000 | INHALATION_SPRAY | Freq: Two times a day (BID) | RESPIRATORY_TRACT | 3 refills | Status: DC

## 2018-12-03 NOTE — Telephone Encounter (Signed)
Spoke with pt and advised rx [facility]. Nothing further is needed.

## 2018-12-09 ENCOUNTER — Telehealth: Payer: Self-pay

## 2018-12-09 NOTE — Telephone Encounter (Signed)
[name] Order: 30mg  #1 prefilled syringe Ordered date: 12/09/18  Expected date of arrival: 12/17/2018 Ordered by: Len Blalock, [place]  Specialty Pharmacy: [facility]

## 2018-12-11 DIAGNOSIS — J449 Chronic obstructive pulmonary disease, unspecified: Secondary | ICD-10-CM | POA: Diagnosis not present

## 2018-12-12 DIAGNOSIS — J4550 Severe persistent asthma, uncomplicated: Secondary | ICD-10-CM | POA: Diagnosis not present

## 2018-12-17 NOTE — Telephone Encounter (Signed)
Fasenra Shipment Received:  30mg  #1 prefilled syringe Medication arrival date: [id number] Lot #: [id number] Exp date: 12/10/2019 Received by: Elliot Dally

## 2018-12-18 DIAGNOSIS — R5383 Other fatigue: Secondary | ICD-10-CM | POA: Diagnosis not present

## 2018-12-18 DIAGNOSIS — Z1159 Encounter for screening for other viral diseases: Secondary | ICD-10-CM | POA: Diagnosis not present

## 2018-12-18 DIAGNOSIS — E559 Vitamin D deficiency, unspecified: Secondary | ICD-10-CM | POA: Diagnosis not present

## 2018-12-18 DIAGNOSIS — E7800 Pure hypercholesterolemia, unspecified: Secondary | ICD-10-CM | POA: Diagnosis not present

## 2018-12-18 DIAGNOSIS — M129 Arthropathy, unspecified: Secondary | ICD-10-CM | POA: Diagnosis not present

## 2018-12-18 DIAGNOSIS — M25511 Pain in right shoulder: Secondary | ICD-10-CM | POA: Diagnosis not present

## 2018-12-18 DIAGNOSIS — J449 Chronic obstructive pulmonary disease, unspecified: Secondary | ICD-10-CM | POA: Diagnosis not present

## 2018-12-18 DIAGNOSIS — M542 Cervicalgia: Secondary | ICD-10-CM | POA: Diagnosis not present

## 2018-12-18 DIAGNOSIS — Z131 Encounter for screening for diabetes mellitus: Secondary | ICD-10-CM | POA: Diagnosis not present

## 2018-12-18 DIAGNOSIS — Z79899 Other long term (current) drug therapy: Secondary | ICD-10-CM | POA: Diagnosis not present

## 2018-12-18 DIAGNOSIS — M79601 Pain in right arm: Secondary | ICD-10-CM | POA: Diagnosis not present

## 2018-12-20 ENCOUNTER — Ambulatory Visit (INDEPENDENT_AMBULATORY_CARE_PROVIDER_SITE_OTHER): Payer: BC Managed Care – PPO

## 2018-12-20 ENCOUNTER — Other Ambulatory Visit: Payer: Self-pay

## 2018-12-20 DIAGNOSIS — J449 Chronic obstructive pulmonary disease, unspecified: Secondary | ICD-10-CM | POA: Diagnosis not present

## 2018-12-20 MED ORDER — BENRALIZUMAB 30 MG/ML ~~LOC~~ SOSY
30.0000 mg | PREFILLED_SYRINGE | Freq: Once | SUBCUTANEOUS | Status: AC
  Administered 2018-12-20: 09:00:00 30 mg via SUBCUTANEOUS

## 2018-12-20 NOTE — Progress Notes (Signed)
Have you been hospitalized within the last 10 days?  No Do you have a fever?  No Do you have a cough?  No Do you have a headache or sore throat? No Do you have your Epi Pen visible and is it within date?  Yes 

## 2018-12-25 ENCOUNTER — Other Ambulatory Visit: Payer: Self-pay | Admitting: Internal Medicine

## 2018-12-25 ENCOUNTER — Other Ambulatory Visit: Payer: Self-pay | Admitting: Pulmonary Disease

## 2018-12-25 DIAGNOSIS — J449 Chronic obstructive pulmonary disease, unspecified: Secondary | ICD-10-CM

## 2018-12-27 ENCOUNTER — Other Ambulatory Visit: Payer: Self-pay

## 2018-12-27 ENCOUNTER — Ambulatory Visit: Payer: BC Managed Care – PPO | Admitting: Internal Medicine

## 2018-12-27 ENCOUNTER — Encounter: Payer: Self-pay | Admitting: Internal Medicine

## 2018-12-27 DIAGNOSIS — J449 Chronic obstructive pulmonary disease, unspecified: Secondary | ICD-10-CM | POA: Diagnosis not present

## 2018-12-27 MED ORDER — ALBUTEROL SULFATE (2.5 MG/3ML) 0.083% IN NEBU: 2.5000 mg | INHALATION_SOLUTION | Freq: Four times a day (QID) | RESPIRATORY_TRACT | 12 refills | Status: DC | PRN

## 2018-12-27 MED ORDER — FAMOTIDINE 20 MG PO TABS: ORAL_TABLET | ORAL | 3 refills | Status: DC

## 2018-12-27 NOTE — Patient Instructions (Addendum)
Try off pantoprazole and change pepcid to 20 mg after bfast and supper x one month then try off the morning dose   Work on inhaler technique:  relax and gently blow all the way out then take a nice smooth deep breath back in, triggering the inhaler at same time you start breathing in.   Rinse and gargle with water when done      No other changes in your medications but you are a good candidate for BRESTRI  Please schedule a follow up visit in 6 months but call sooner if needed with pfts on return

## 2018-12-27 NOTE — Progress Notes (Signed)
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 Pulmonary Consult    Self referral for COPD. Pt was dxed with COPD 2 43yrs ago. She has been seen in the past by Dr Verdie Mosher. She states she gets tired easily. She gets winded with carrying something heavy and some days with minimal walking. She uses proair once daily on average and neb with albuterol 3-4 x per wk.   Dyspnea:  Best days doe = MMRC3 = can't walk 100 yards even at a slow pace at a flat grade s stopping due to sob  / better p a few hours but highly variable s pattern  Cough: better since stopped smoking Sleep: bed is flat/ 2 pillows / no am flare  SABA use: neb helps the most  rec Plan A = Automatic = symbicort 160 Take 2 puffs first thing in am and then another 2 puffs about 12 hours later.  Work on inhaler technique:  Plan B = Backup Only use your albuterol as a rescue medication Plan C = Crisis - only use your albuterol nebulizer if you first try Plan B and it fails to help  Plan D = Deltasone  If worse despite A thru C >  Try Deltasone = Prednisone 10 mg take  4 each am x 2 days,   2 each am x 2 days,  1 each am x 2 days and stop       11/08/2017  f/u ov/Andrya Roppolo re:  GOLD III / no better on symb 160 2bid  Chief Complaint  Patient presents with  . Follow-up    c/o sob with exertion, using rescue inhaler daily.   Dyspnea:  Not really much better, never used prednisone as rec  Cough:  no Sleeping: bed flat 2 pillows  SABA use: about twice daily hfa and neb  02: none  rec Go ahead and take the prednisone you have on hand> a lot better and maintained on symb 160 2bid   Work on inhaler technique:  Only use your albuterol as a rescue medication        01/18/2018  f/u ov/Haile Bosler re:  Copd GOLD III  symbicort / spiriva handihaler  Still on pred/ last day Chief Complaint  Patient presents with  . Follow-up    Breathing is okay today. She has had another hospital stay since last visit for increased SOB. She has not use her rescue inhaler or neb since last hospitalization.   Dyspnea:  MMRC2 = can't walk a nl pace on a flat grade s sob but does fine slow and flat  Cough: none Sleeping:  Bed flat/ one pillow  SABA use: none now   02: none  Last flare occurred at work per H&P notes and she did not respond initially to saba  rec Plan A = Automatic = symbicort 160 Take 2 puffs  and spiriva  first thing when you wake up  and then another 2 puffs of symbicort about 12 hours later. Also add Pantoprazole (protonix) 40 mg   Take  30-60 min before first meal of the day and Pepcid (famotidine)  20 mg one @  bedtime until return to office - this is the best way to tell whether stomach acid is contributing to your problem.   Work on inhaler technique:  Plan B = Backup Only use your albuterol as a rescue medication Plan C = Crisis - only use your albuterol nebulizer if you first try Plan B and it fails to help > ok to use the nebulizer up to every 4 hours but if start needing it regularly call for immediate appointment Plan D = Deltasone  If worse despite A thru C >  Try Deltasone = Prednisone 10 mg take  4 each am x 2 days,   2 each am x 2 days,  1 each am x 2 days and stop    GERD (REFLUX)  is an extremely common cause of respiratory symptoms just like yours , many times with no obvious heartburn at all.  Please keep appt for pfts  with all medications /inhalers/ solutions in hand so we  can verify exactly what you are taking. This includes all medications from all doctors and over the counters        07/03/2018  f/u ov/Jaquille Kau re:  ACOS / freq prednisone need despite sym 160/spiriva smi and singulair max rx for gerd and now 100% adherent as far as can be detected  Chief Complaint  Patient presents with  . Follow-up    Started having increased SOB approx 1 wk ago- had to use neb and rescue inhaler and so started on prednisone and currently taking 30 mg- breathing has improved some.   Dyspnea:  On prednisone MMRC2 = can't walk a nl pace on a flat grade s sob but does fine slow and flat  And off prednisone x sev weeks  > sob at rest so presently back on pred still 30 mg daily  Cough: more at work / min prod mucoid  Sleeping: ok cool room / bed blocks  SABA use: none  02: only when flaring rec We will see if we can qualify you for Fasenra injections > started 08/28/18 Work on inhaler technique:      10/03/2018  f/u ov/Dalesha Stanback re: ACOS  Chief Complaint  Patient presents with  . Follow-up    Breathing is much improved on Fascenra. She rarely uses her albuterol inhaler or neb.   Dyspnea:  MMRC2 = can't walk a nl pace on a flat grade s sob but does fine slow and flat  Cough: still some spells  Sleeping: fine rec Stop your singulair when you finish the bottle, ok to resume if you notice any worse respiratory symptoms   12/27/2018  f/u ov/Alegria Dominique re: acos/ on fosrenra/ symb/spiriva  Chief Complaint  Patient presents with  . Follow-up    COPD GOLD ? III with increased Eos and reversibility ? ACOS ?  Dyspnea:  Walking at work / walks across parking/ no steps = MMRC1 = can walk nl pace, flat grade, can't hurry or go uphills or steps s sob   Cough: better  Sleeping: fine on bed blocks x 8 in SABA use: none 02: none    No obvious day to day or daytime variability or assoc excess/ purulent sputum or mucus plugs or hemoptysis or  cp or chest tightness, subjective wheeze or overt  sinus or hb symptoms.   Sl;e without nocturnal  or early am exacerbation  of respiratory  c/o's or need for noct saba. Also denies any obvious fluctuation of symptoms with weather or environmental changes or other aggravating or alleviating factors except as outlined above   No unusual exposure hx or h/o childhood pna/ asthma or knowledge of premature birth.  Current Allergies, Complete Past Medical History, Past Surgical History, Family History, and Social History were reviewed in [employer] record.  ROS  The following are not active complaints unless bolded Hoarseness, sore throat, dysphagia, dental problems, itching, sneezing,  nasal congestion or discharge of excess mucus or purulent secretions, ear ache,   fever, chills, sweats, unintended wt loss or wt gain, classically pleuritic or exertional cp,  orthopnea pnd or arm/hand swelling  or leg swelling, presyncope, palpitations, abdominal pain, anorexia, nausea, vomiting, diarrhea  or change in bowel habits or change in bladder habits, change in stools or change in urine, dysuria, hematuria,  rash, arthralgias, visual complaints, headache, numbness, weakness or ataxia or problems with walking or coordination,  change in mood or  memory.        Current Meds  Medication Sig  . albuterol (PROAIR HFA) 108 (90 Base) MCG/ACT inhaler Inhale 2 puffs into the lungs every 6 (six) hours as needed for wheezing or shortness of breath.  Marland Kitchen albuterol (PROVENTIL) (2.5 MG/3ML) 0.083% nebulizer solution Take 3 mLs (2.5 mg total) by nebulization every 6 (six) hours as needed for wheezing or shortness of breath.  . ALPRAZolam (XANAX) 0.25 MG tablet Take 0.25 mg by mouth daily as needed for anxiety.   Marland Kitchen atorvastatin (LIPITOR) 20 MG tablet Take 20 mg by mouth at bedtime.  . bismuth subsalicylate (PEPTO BISMOL) 262 MG chewable tablet Chew 262-524 mg by mouth daily as needed for diarrhea or loose stools.  . budesonide-formoterol (SYMBICORT)  160-4.5 MCG/ACT inhaler Inhale 2 puffs into the lungs 2 (two) times daily.  Marland Kitchen Dexmethylphenidate HCl 40 MG CP24 Take 40 mg by mouth daily.   . famotidine (PEPCID) 20 MG tablet Take 1 tablet (20 mg total) by mouth at bedtime.  Marland Kitchen FASENRA 30 MG/ML SOSY Inject 30 mg into the skin every 28 (twenty-eight) days.  Marland Kitchen lamoTRIgine (LAMICTAL) 200 MG tablet Take 200 mg by mouth at bedtime.   Marland Kitchen loratadine (CLARITIN) 10 MG tablet Take 10 mg by mouth daily.  . montelukast (SINGULAIR) 10 MG tablet TAKE 1 TABLET BY MOUTH EVERYDAY AT BEDTIME  . pantoprazole (PROTONIX) 40 MG tablet TAKE 1 TABLET (40 MG TOTAL) BY MOUTH DAILY. TAKE 30-60 MIN BEFORE FIRST MEAL OF THE DAY  . predniSONE (DELTASONE) 10 MG tablet TAKE 2 TABLETS ON WAKING UP UNTIL BETTER THEN 1 TABLET DAILY FOR 1 WEEK THEN STOP  . QUEtiapine (SEROQUEL) 50 MG tablet Take 100 mg by mouth at bedtime.  . risperiDONE (RISPERDAL) 1 MG tablet Take 3 mg by mouth at bedtime.   Marland Kitchen Spacer/Aero-Holding Chambers (AEROCHAMBER MV) inhaler Use as instructed  . Tiotropium Bromide Monohydrate (SPIRIVA RESPIMAT) 2.5 MCG/ACT AERS Inhale 2 puffs into the lungs daily.  Marland Kitchen venlafaxine XR (EFFEXOR-XR) 150 MG 24 hr capsule Take 150 mg by mouth at bedtime.   . Vitamin D, Ergocalciferol, (DRISDOL) 50000 units CAPS capsule Take 50,000 Units by mouth every Saturday.                    Objective:     Obese  pleasant amb wf nad  12/27/2018      193 10/03/2018        199  07/03/2018        205  05/24/2018        204  02/11/2018          197   01/18/2018        199   12/04/2017     191   11/08/17 189 lb (85.7 kg)  10/31/17 187 lb (84.8 kg)  09/18/17 179 lb 0.2 oz (81.2 kg)      Vital signs reviewed - Note on arrival 02 sats  94% on RA     reported:full dentures   HEENT : pt wearing mask not removed for exam due to covid -19 concerns.    NECK :  without JVD/Nodes/TM/ nl carotid upstrokes bilaterally   LUNGS: no acc muscle use,  Nl contour chest with distant bs   bilaterally without cough on insp or exp maneuvers   CV:  RRR  no s3 or murmur or increase in P2, and no edema   ABD:  Obese oft and nontender with nl inspiratory excursion in the supine position. No bruits or organomegaly appreciated, bowel sounds nl  MS:  Nl gait/ ext warm without deformities, calf tenderness, cyanosis or clubbing No obvious joint restrictions   SKIN: warm and dry without lesions    NEURO:  alert, approp, nl sensorium with  no motor or cerebellar deficits apparent.               Assessment

## 2018-12-27 NOTE — Assessment & Plan Note (Signed)
Quit smoking 08/2017  Spirometry 10/31/2017  FEV1 0.7 (30%)  Ratio 51 p Breo in am   - 10/31/2017  Eos = 0.9  - alpha one AT screen 10/31/2017   MS level 121  - 10/31/2017   Walked RA x one lap @ 185 stopped due to sob s desats at fast pace   - 01/18/2018   Walked RA  2 laps @ 268f each @ fast  pace  stopped due to sob no desat  - Spirometry 01/18/2018  FEV1 1.1 (49%)  Ratio 63 with mild curvature after am spiriva and symbicort  - 02/11/2018    added prednisone as plan D / changed spiriva to smi - PFT's 03/06/18  FEV1 1.02 (43 % ) ratio 0.62  p 27 % improvement from saba p nothing prior to study with DLCO  59/63 % corrects to 77  % for alv volume  With ERV 65  - Singulair 03/14/2018 >> d/c'd 10/10/2018 s adverse effects - Allergy profile 05/24/2018 >  Eos 0.6/  IgE 25 RAST neg    - 07/03/2018  After extensive coaching inhaler device,  effectiveness =    90% finally and still flares w/in a week or two off prednisone so rec fasenra trial and continue pred with ceiling of 20 and floor of 0 as plan D -  08/28/18 started fasenra > marked clinical improvement - 12/27/2018  After extensive coaching inhaler device,  effectiveness =    90% from baseline 75% (delayed insp/ uses spacer for symbicort)   Despite suboptimal hfa, since rx with fosenra >>> All goals of chronic asthma control met including optimal function and elimination of symptoms with minimal need for rescue therapy.  Contingencies discussed in full including contacting this office immediately if not controlling the symptoms using the rule of two's.      Pt informed of the seriousness of COVID 19 infection as a direct risk to their health  and safey and to those of their loved ones and should continue to wear facemask in public and minimize exposure to public locations but especially avoid any area or activity where non-close contacts are not observing distancing or wearing an appropriate face mask>>> rec vaccine as soon as offered   >>> f/u in 6 m,  sooner if needed, with only issue cost of laba/ics/nebs and breztri best option depending on insurance.

## 2019-01-01 ENCOUNTER — Telehealth: Payer: Self-pay | Admitting: Internal Medicine

## 2019-01-01 MED ORDER — PREDNISONE 10 MG PO TABS: ORAL_TABLET | ORAL | 0 refills | Status: DC

## 2019-01-01 MED ORDER — MONTELUKAST SODIUM 10 MG PO TABS: 10.0000 mg | ORAL_TABLET | Freq: Every day | ORAL | 11 refills | Status: DC

## 2019-01-01 NOTE — Telephone Encounter (Signed)
Called and spoke with pt who stated 2 days ago she noticed she was more winded and then stated it became worse yesterday 12/22. Pt said she even had to use her rescue inhaler. Pt said she used her rescue inhaler once yesterday during the day and then twice overnight.  Pt said she has been receiving Fasenra injections which she had been doing great on.  Pt said with two days ago, she can walk short distances and will need to stop to get her breath back.  Pt said at last OV, she was taken off singulair but pt now wonders if she might need to go back on singulair.  Pt denies any complaints of wheezing and pt also said she hardly has any cough.   Dr. Melvyn Novas, please advise on this for pt. Thanks!  Instructions  Try off pantoprazole and change pepcid to 20 mg after bfast and supper x one month then try off the morning dose   Work on inhaler technique:  relax and gently blow all the way out then take a nice smooth deep breath back in, triggering the inhaler at same time you start breathing in.   Rinse and gargle with water when done      No other changes in your medications but you are a good candidate for BRESTRI  Please schedule a follow up visit in 6 months but call sooner if needed with pfts on return

## 2019-01-01 NOTE — Telephone Encounter (Signed)
Called and spoke with pt letting her know the info from MW that she could restart singulair and also that we were sending pred taper to pharmacy for her as well. Stated to her after prednisone if no better she would need to schedule a televisit and she verbalized understanding. Verified pt's preferred pharmacy and sent meds in. Nothing further needed.

## 2019-01-01 NOTE — Telephone Encounter (Signed)
singulair takes a week to work but ok to restart.  In meantime rec Prednisone 10 mg take  4 each am x 2 days,   2 each am x 2 days,  1 each am x 2 days and stop   Set up with televisit if not improving

## 2019-01-02 DIAGNOSIS — M25511 Pain in right shoulder: Secondary | ICD-10-CM | POA: Diagnosis not present

## 2019-01-02 DIAGNOSIS — E559 Vitamin D deficiency, unspecified: Secondary | ICD-10-CM | POA: Diagnosis not present

## 2019-01-02 DIAGNOSIS — J449 Chronic obstructive pulmonary disease, unspecified: Secondary | ICD-10-CM | POA: Diagnosis not present

## 2019-01-02 DIAGNOSIS — E7800 Pure hypercholesterolemia, unspecified: Secondary | ICD-10-CM | POA: Diagnosis not present

## 2019-01-08 DIAGNOSIS — F3175 Bipolar disorder, in partial remission, most recent episode depressed: Secondary | ICD-10-CM | POA: Diagnosis not present

## 2019-01-11 DIAGNOSIS — J449 Chronic obstructive pulmonary disease, unspecified: Secondary | ICD-10-CM | POA: Diagnosis not present

## 2019-01-20 ENCOUNTER — Other Ambulatory Visit: Payer: Self-pay | Admitting: Internal Medicine

## 2019-01-25 IMAGING — CR DG CHEST 2V
2 series · 2 of 2 positions shown · non-contrast
Comparison: 12/31/2017

CLINICAL DATA: Shortness of breath

EXAM:
CHEST - 2 VIEW

[w chest lat]
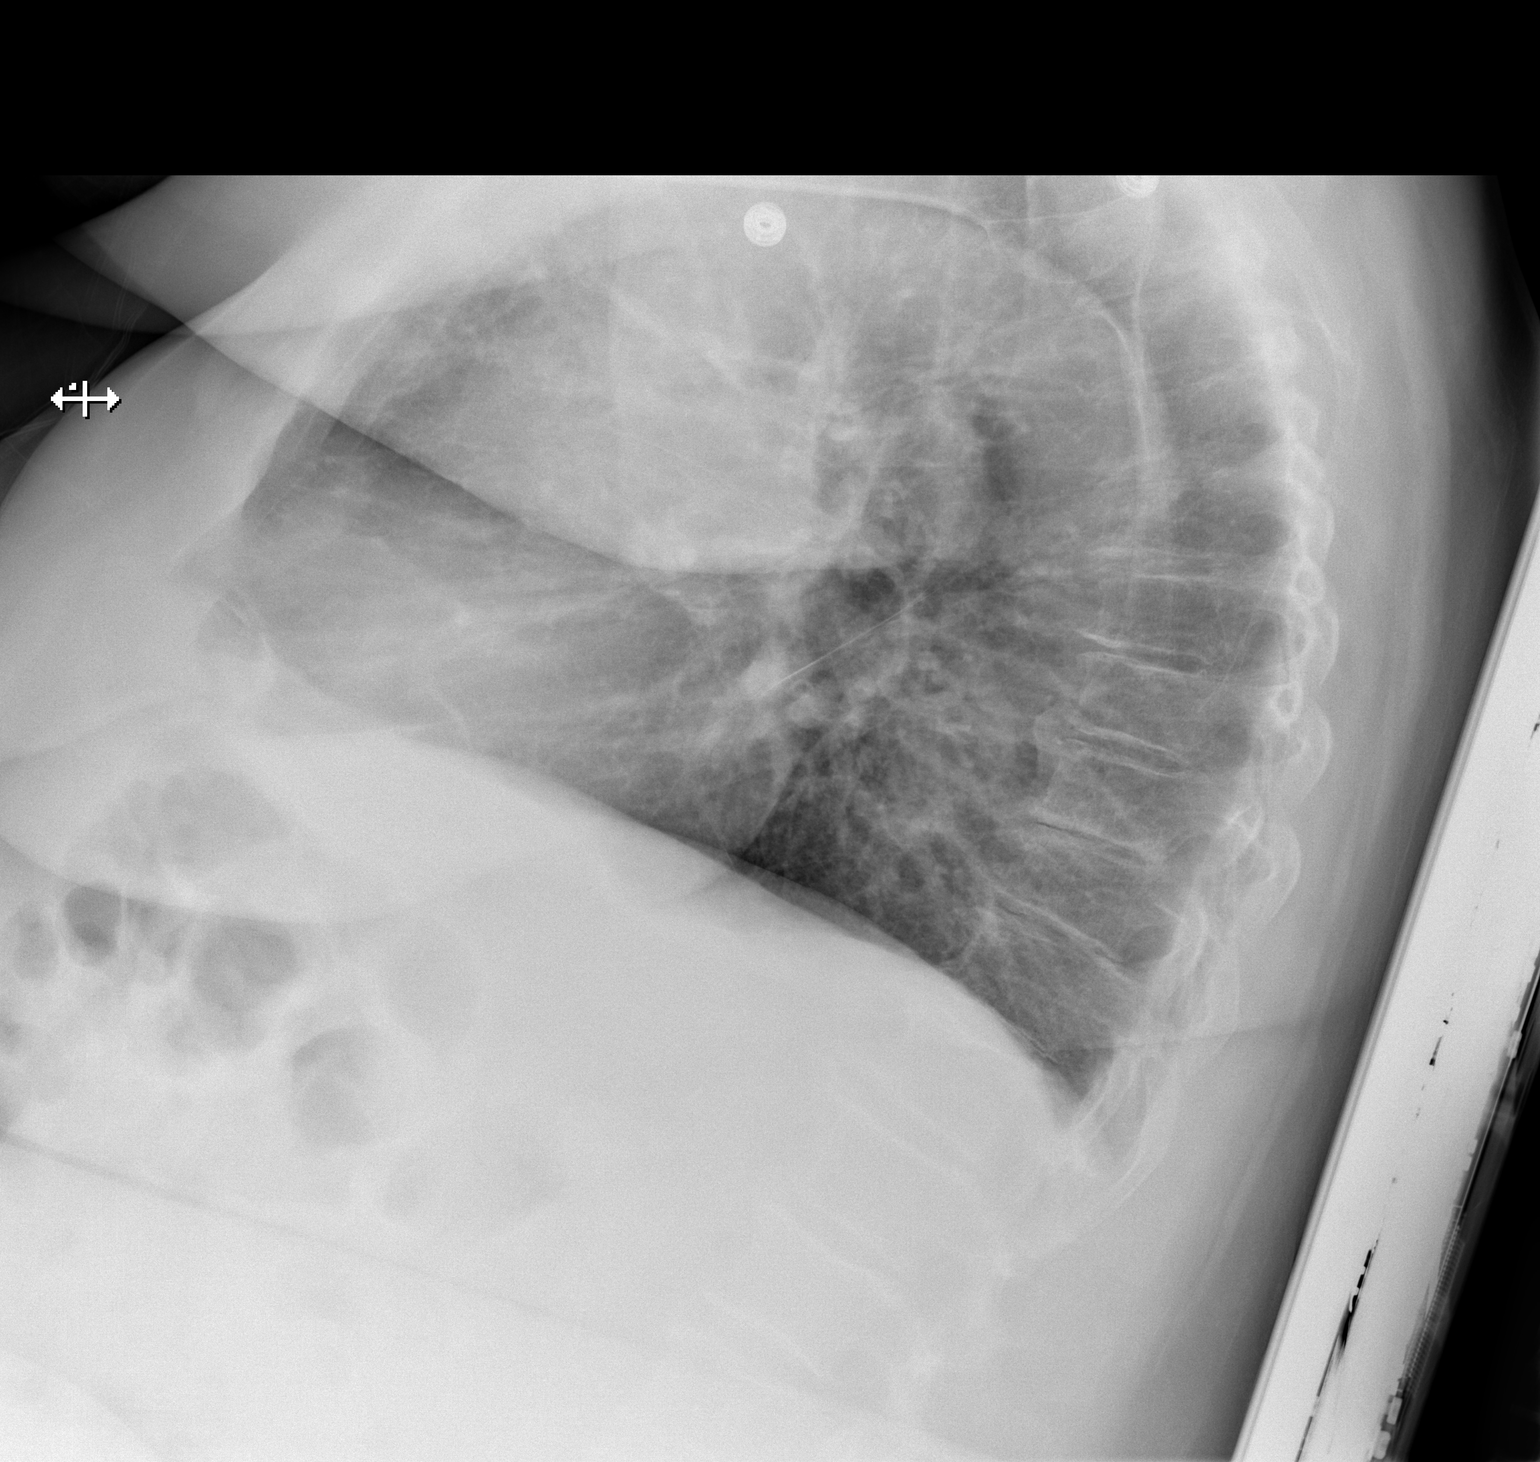

[x chest ap]
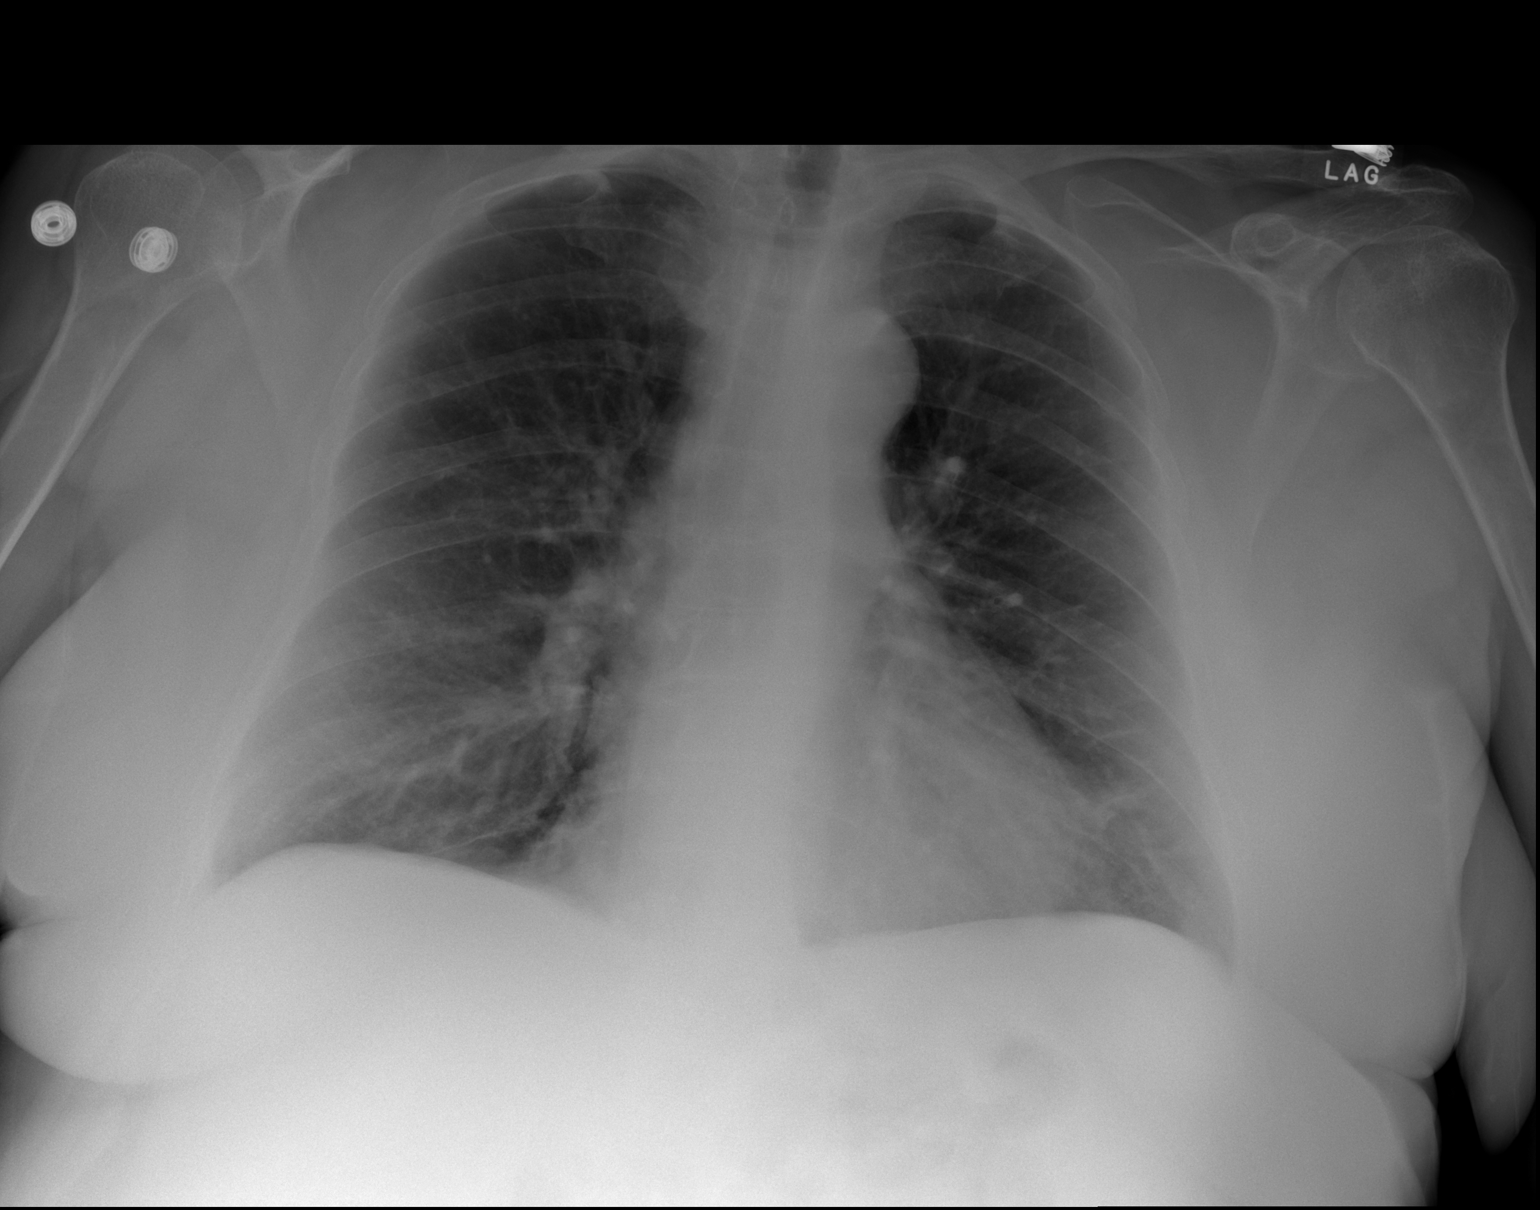

[2 of 2 positions shown; findings below may reference images not displayed]

FINDINGS: Mild bronchitic changes. No focal consolidation or effusion. Normal
heart size. No pneumothorax. Degenerative changes of the spine.
IMPRESSION: No active cardiopulmonary disease.  Mild bronchitic changes

## 2019-02-03 ENCOUNTER — Telehealth: Payer: Self-pay | Admitting: Internal Medicine

## 2019-02-03 NOTE — Telephone Encounter (Signed)
Spoke with pt. She is scheduled for her COVID vaccine the day before her Fasenra injection. COVID vaccine is scheduled for 02/13/2019. [name] has been moved to 02/17/2019 at [place]. Nothing further was needed.

## 2019-02-05 ENCOUNTER — Telehealth: Payer: Self-pay | Admitting: Internal Medicine

## 2019-02-05 MED ORDER — SPIRIVA RESPIMAT 2.5 MCG/ACT IN AERS: 2.0000 | INHALATION_SPRAY | Freq: Every day | RESPIRATORY_TRACT | 0 refills | Status: DC

## 2019-02-05 MED ORDER — SPIRIVA RESPIMAT 2.5 MCG/ACT IN AERS: 2.0000 | INHALATION_SPRAY | Freq: Every day | RESPIRATORY_TRACT | 11 refills | Status: DC

## 2019-02-05 NOTE — Telephone Encounter (Signed)
rx sent and 1 sample up front for pick up  Called and left detailed msg that this was done

## 2019-02-06 ENCOUNTER — Other Ambulatory Visit: Payer: Self-pay | Admitting: Internal Medicine

## 2019-02-06 MED ORDER — SPIRIVA RESPIMAT 2.5 MCG/ACT IN AERS: 2.0000 | INHALATION_SPRAY | Freq: Every day | RESPIRATORY_TRACT | 3 refills | Status: DC

## 2019-02-10 ENCOUNTER — Telehealth: Payer: Self-pay | Admitting: Internal Medicine

## 2019-02-10 DIAGNOSIS — F3175 Bipolar disorder, in partial remission, most recent episode depressed: Secondary | ICD-10-CM | POA: Diagnosis not present

## 2019-02-10 NOTE — Telephone Encounter (Signed)
[name] Order: 30mg  #1 prefilled syringe Ordered date: 02/10/19 Expected date of arrival: 02/12/19 Ordered by: [place]: [facility]

## 2019-02-11 DIAGNOSIS — J449 Chronic obstructive pulmonary disease, unspecified: Secondary | ICD-10-CM | POA: Diagnosis not present

## 2019-02-13 NOTE — Telephone Encounter (Signed)
Called [facility], spoke with [place]. Fasenra shipment was not received, due to major medical approval.  Shipment is scheduled for 02/14/19.

## 2019-02-14 ENCOUNTER — Ambulatory Visit: Payer: BC Managed Care – PPO

## 2019-02-14 DIAGNOSIS — J4550 Severe persistent asthma, uncomplicated: Secondary | ICD-10-CM | POA: Diagnosis not present

## 2019-02-14 NOTE — Telephone Encounter (Signed)
Fasenra not received from [facility] as scheduled. Called [facility], spoke with [place]. Reuben Likes stated major medical was not cleared for approval to ship. Patient scheduled 0845 02/19/19.   New shipment date is 02/18/19.

## 2019-02-17 ENCOUNTER — Ambulatory Visit: Payer: BC Managed Care – PPO

## 2019-02-18 NOTE — Telephone Encounter (Signed)
Fasenra Shipment Received:  30mg  #1 prefilled syringe Medication arrival date: 02/18/2019 Lot #: [id number] Exp date: 01/10/2020 Received by: Elliot Dally

## 2019-02-19 ENCOUNTER — Other Ambulatory Visit: Payer: Self-pay

## 2019-02-19 ENCOUNTER — Ambulatory Visit (INDEPENDENT_AMBULATORY_CARE_PROVIDER_SITE_OTHER): Payer: BC Managed Care – PPO

## 2019-02-19 DIAGNOSIS — J449 Chronic obstructive pulmonary disease, unspecified: Secondary | ICD-10-CM

## 2019-02-19 MED ORDER — BENRALIZUMAB 30 MG/ML ~~LOC~~ SOSY
30.0000 mg | PREFILLED_SYRINGE | Freq: Once | SUBCUTANEOUS | Status: AC
  Administered 2019-02-19: 09:00:00 30 mg via SUBCUTANEOUS

## 2019-02-19 NOTE — Progress Notes (Signed)
Have you been hospitalized within the last 10 days?  No Do you have a fever?  No Do you have a cough?  No Do you have a headache or sore throat? No Do you have your Epi Pen visible and is it within date?  Yes 

## 2019-02-25 ENCOUNTER — Other Ambulatory Visit: Payer: Self-pay | Admitting: Internal Medicine

## 2019-03-06 ENCOUNTER — Other Ambulatory Visit: Payer: Self-pay | Admitting: Internal Medicine

## 2019-03-06 DIAGNOSIS — J449 Chronic obstructive pulmonary disease, unspecified: Secondary | ICD-10-CM

## 2019-03-06 NOTE — Telephone Encounter (Signed)
Electronic refill request for Prednisone 1m >> 288mdaily until better, then 1 daily x1 week then stop #100 with 1 refill is being requested.  Dr WeMelvyn Novaslease advise if okay to fill.   Last office visit 12.18.20 with Dr WeMelvyn Novas COPD  GOLD ? III with increased Eos and reversibility ? ACOS ?  - WeTanda RockersMD at 12/27/2018  9:49 AM  Status: Written  Related Problem: COPD  GOLD ? III with increased Eos and reversibility ? ACOS ?     Quit smoking 08/2017  Spirometry 10/31/2017  FEV1 0.7 (30%)  Ratio 51 p Breo in am   - 10/31/2017  Eos = 0.9  - alpha one AT screen 10/31/2017   MS level 121  - 10/31/2017   Walked RA x one lap @ 185 stopped due to sob s desats at fast pace   - 01/18/2018   Walked RA  2 laps @ 2508fach @ fast  pace  stopped due to sob no desat  - Spirometry 01/18/2018  FEV1 1.1 (49%)  Ratio 63 with mild curvature after am spiriva and symbicort  - 02/11/2018    added prednisone as plan D / changed spiriva to smi - PFT's 03/06/18  FEV1 1.02 (43 % ) ratio 0.62  p 27 % improvement from saba p nothing prior to study with DLCO  59/63 % corrects to 77  % for alv volume  With ERV 65  - Singulair 03/14/2018 >> d/c'd 10/10/2018 s adverse effects - Allergy profile 05/24/2018 >  Eos 0.6/  IgE 25 RAST neg    - 07/03/2018  After extensive coaching inhaler device,  effectiveness =    90% finally and still flares w/in a week or two off prednisone so rec fasenra trial and continue pred with ceiling of 20 and floor of 0 as plan D -  08/28/18 started fasenra > marked clinical improvement - 12/27/2018  After extensive coaching inhaler device,  effectiveness =    90% from baseline 75% (delayed insp/ uses spacer for symbicort)   Despite suboptimal hfa, since rx with fosenra >>> All goals of chronic asthma control met including optimal function and elimination of symptoms with minimal need for rescue therapy.   Contingencies discussed in full including contacting this office immediately if not controlling  the symptoms using the rule of two's.        Pt informed of the seriousness of COVID 19 infection as a direct risk to their health  and safey and to those of their loved ones and should continue to wear facemask in public and minimize exposure to public locations but especially avoid any area or activity where non-close contacts are not observing distancing or wearing an appropriate face mask>>> rec vaccine as soon as offered     >>> f/u in 6 m, sooner if needed, with only issue cost of laba/ics/nebs and breztri best option depending on insurance.

## 2019-03-11 DIAGNOSIS — J449 Chronic obstructive pulmonary disease, unspecified: Secondary | ICD-10-CM | POA: Diagnosis not present

## 2019-03-18 DIAGNOSIS — Z13220 Encounter for screening for lipoid disorders: Secondary | ICD-10-CM | POA: Diagnosis not present

## 2019-03-18 DIAGNOSIS — E559 Vitamin D deficiency, unspecified: Secondary | ICD-10-CM | POA: Diagnosis not present

## 2019-03-18 DIAGNOSIS — Z1159 Encounter for screening for other viral diseases: Secondary | ICD-10-CM | POA: Diagnosis not present

## 2019-03-18 DIAGNOSIS — M129 Arthropathy, unspecified: Secondary | ICD-10-CM | POA: Diagnosis not present

## 2019-03-18 DIAGNOSIS — R5383 Other fatigue: Secondary | ICD-10-CM | POA: Diagnosis not present

## 2019-03-18 DIAGNOSIS — R0602 Shortness of breath: Secondary | ICD-10-CM | POA: Diagnosis not present

## 2019-03-18 DIAGNOSIS — Z131 Encounter for screening for diabetes mellitus: Secondary | ICD-10-CM | POA: Diagnosis not present

## 2019-03-18 DIAGNOSIS — Z0000 Encounter for general adult medical examination without abnormal findings: Secondary | ICD-10-CM | POA: Diagnosis not present

## 2019-03-18 DIAGNOSIS — D539 Nutritional anemia, unspecified: Secondary | ICD-10-CM | POA: Diagnosis not present

## 2019-03-18 DIAGNOSIS — Z20822 Contact with and (suspected) exposure to covid-19: Secondary | ICD-10-CM | POA: Diagnosis not present

## 2019-03-24 NOTE — Telephone Encounter (Signed)
Rx sent by [facility] on 2.25.21

## 2019-04-01 DIAGNOSIS — D509 Iron deficiency anemia, unspecified: Secondary | ICD-10-CM | POA: Diagnosis not present

## 2019-04-01 DIAGNOSIS — E559 Vitamin D deficiency, unspecified: Secondary | ICD-10-CM | POA: Diagnosis not present

## 2019-04-01 DIAGNOSIS — J159 Unspecified bacterial pneumonia: Secondary | ICD-10-CM | POA: Diagnosis not present

## 2019-04-01 DIAGNOSIS — J449 Chronic obstructive pulmonary disease, unspecified: Secondary | ICD-10-CM | POA: Diagnosis not present

## 2019-04-03 ENCOUNTER — Other Ambulatory Visit: Payer: Self-pay | Admitting: Internal Medicine

## 2019-04-07 ENCOUNTER — Telehealth: Payer: Self-pay | Admitting: Internal Medicine

## 2019-04-07 NOTE — Telephone Encounter (Signed)
[name] Order: 30mg  #1 prefilled syringe Ordered date: 04/07/2019 Expected date of arrival: 04/09/2019 Ordered by: Desmond Dike, [place]  Specialty Pharmacy: [facility]

## 2019-04-10 DIAGNOSIS — J4550 Severe persistent asthma, uncomplicated: Secondary | ICD-10-CM | POA: Diagnosis not present

## 2019-04-10 NOTE — Telephone Encounter (Signed)
Contacted [facility] to check on the status of the pt's shipment as we did not receive it yesterday. Was advised that the prescription is still being processed through insurance due to the pt's medication being covered under her medical benefit. Once it's been fully processed then shipment can be set up.

## 2019-04-11 DIAGNOSIS — J449 Chronic obstructive pulmonary disease, unspecified: Secondary | ICD-10-CM | POA: Diagnosis not present

## 2019-04-16 ENCOUNTER — Ambulatory Visit: Payer: BC Managed Care – PPO

## 2019-04-16 NOTE — Telephone Encounter (Signed)
Fasenra Shipment Received:  30mg  #1 prefilled syringe Medication arrival date: 04/16/19 Lot #: [id number] Exp date: 05/08/2020 Received by: Elliot Dally

## 2019-04-17 ENCOUNTER — Ambulatory Visit (INDEPENDENT_AMBULATORY_CARE_PROVIDER_SITE_OTHER): Payer: BC Managed Care – PPO

## 2019-04-17 ENCOUNTER — Other Ambulatory Visit: Payer: Self-pay

## 2019-04-17 DIAGNOSIS — J449 Chronic obstructive pulmonary disease, unspecified: Secondary | ICD-10-CM

## 2019-04-17 DIAGNOSIS — F3175 Bipolar disorder, in partial remission, most recent episode depressed: Secondary | ICD-10-CM | POA: Diagnosis not present

## 2019-04-17 MED ORDER — BENRALIZUMAB 30 MG/ML ~~LOC~~ SOSY
30.0000 mg | PREFILLED_SYRINGE | Freq: Once | SUBCUTANEOUS | Status: AC
  Administered 2019-04-17: 30 mg via SUBCUTANEOUS

## 2019-04-17 MED ORDER — MONTELUKAST SODIUM 10 MG PO TABS: 10.0000 mg | ORAL_TABLET | Freq: Every day | ORAL | 11 refills | Status: DC

## 2019-04-17 NOTE — Progress Notes (Signed)
Have you been hospitalized within the last 10 days?  No Do you have a fever?  No Do you have a cough?  No Do you have a headache or sore throat? No Do you have your Epi Pen visible and is it within date?  Yes 

## 2019-04-23 ENCOUNTER — Telehealth: Payer: Self-pay | Admitting: Internal Medicine

## 2019-04-23 NOTE — Telephone Encounter (Signed)
Pt is currently receiving Fasenra 30mg q8w at our office. We are starting to transition our patients to self administer at home. Please advise if you believe pt would be a good candidate for this. Thanks.  

## 2019-04-23 NOTE — Telephone Encounter (Signed)
Please disregard message

## 2019-05-11 DIAGNOSIS — J449 Chronic obstructive pulmonary disease, unspecified: Secondary | ICD-10-CM | POA: Diagnosis not present

## 2019-05-15 ENCOUNTER — Telehealth: Payer: Self-pay | Admitting: Internal Medicine

## 2019-05-15 MED ORDER — SPIRIVA RESPIMAT 2.5 MCG/ACT IN AERS: 2.0000 | INHALATION_SPRAY | Freq: Every day | RESPIRATORY_TRACT | 0 refills | Status: DC

## 2019-05-15 NOTE — Telephone Encounter (Signed)
Patient requested Spiriva samples to cover her until her mail order pharmacy delivers Spiriva 2.5.  Spiriva is scheduled for 05/30/19 home delivery. Spiriva 2.5 samples placed at front for Patient pick up. Nothing further at this time.

## 2019-05-21 DIAGNOSIS — F3175 Bipolar disorder, in partial remission, most recent episode depressed: Secondary | ICD-10-CM | POA: Diagnosis not present

## 2019-05-26 ENCOUNTER — Telehealth: Payer: Self-pay | Admitting: Internal Medicine

## 2019-05-26 MED ORDER — SPIRIVA RESPIMAT 2.5 MCG/ACT IN AERS: 2.0000 | INHALATION_SPRAY | Freq: Every day | RESPIRATORY_TRACT | 1 refills | Status: DC

## 2019-05-26 MED ORDER — SPIRIVA RESPIMAT 2.5 MCG/ACT IN AERS: 2.0000 | INHALATION_SPRAY | Freq: Every day | RESPIRATORY_TRACT | 0 refills | Status: DC

## 2019-05-26 NOTE — Telephone Encounter (Signed)
Spoke with patient. She stated that she needed to have refills for her Spiriva 2.51mcg to be sent to [facility]. She had called them for refills but they stated that did not hear back from our office. They also advised her that it would take at least 2 weeks for her medication to ship. She had received samples a few weeks ago but was under the impression that her RX would be here by the 21st. She has a box that she will open today but wanted to see if she could get another sample just in case the shipment is delayed. I advised her that I would place the sample at the door for her. She verbalized understanding.   RX has been sent in, sample left up front. Nothing further needed at time of call.

## 2019-05-29 ENCOUNTER — Telehealth: Payer: Self-pay | Admitting: Internal Medicine

## 2019-05-29 MED ORDER — BUDESONIDE-FORMOTEROL FUMARATE 160-4.5 MCG/ACT IN AERO: INHALATION_SPRAY | RESPIRATORY_TRACT | 1 refills | Status: DC

## 2019-05-29 NOTE — Telephone Encounter (Signed)
Rx sent to her preferred pharm  Called and left her a detailed msg letting her know that this was done

## 2019-06-02 ENCOUNTER — Telehealth: Payer: Self-pay | Admitting: Internal Medicine

## 2019-06-02 NOTE — Telephone Encounter (Signed)
[name] Order: 30mg  #1 prefilled syringe Ordered date: 06/02/2019 Expected date of arrival: 06/05/2019 Ordered by: Desmond Dike, [place]  Specialty Pharmacy: [facility]

## 2019-06-05 DIAGNOSIS — J4550 Severe persistent asthma, uncomplicated: Secondary | ICD-10-CM | POA: Diagnosis not present

## 2019-06-09 ENCOUNTER — Other Ambulatory Visit: Payer: Self-pay | Admitting: Internal Medicine

## 2019-06-09 DIAGNOSIS — B370 Candidal stomatitis: Secondary | ICD-10-CM | POA: Diagnosis not present

## 2019-06-10 NOTE — Telephone Encounter (Signed)
[facility] calling to reschedule delivery for pt's fasenra injection. Can be reached at [phone]

## 2019-06-11 DIAGNOSIS — J449 Chronic obstructive pulmonary disease, unspecified: Secondary | ICD-10-CM | POA: Diagnosis not present

## 2019-06-11 NOTE — Telephone Encounter (Signed)
[name] Order: 30mg  #1 prefilled syringe Ordered date: 06/11/2019 Expected date of arrival: 06/12/2019 Ordered by: Desmond Dike, [place] [facility]: [facility]

## 2019-06-12 ENCOUNTER — Ambulatory Visit: Payer: BC Managed Care – PPO

## 2019-06-12 NOTE — Telephone Encounter (Signed)
Fasenra Shipment Received:  30mg  #1 prefilled syringe Medication arrival date: 06/12/2019 Lot #: [id number] Exp date: 09/2020 Received by: Desmond Dike, [place]

## 2019-06-20 ENCOUNTER — Ambulatory Visit (INDEPENDENT_AMBULATORY_CARE_PROVIDER_SITE_OTHER): Payer: BC Managed Care – PPO

## 2019-06-20 ENCOUNTER — Other Ambulatory Visit: Payer: Self-pay

## 2019-06-20 DIAGNOSIS — J4550 Severe persistent asthma, uncomplicated: Secondary | ICD-10-CM

## 2019-06-20 DIAGNOSIS — J449 Chronic obstructive pulmonary disease, unspecified: Secondary | ICD-10-CM | POA: Diagnosis not present

## 2019-06-20 MED ORDER — BENRALIZUMAB 30 MG/ML ~~LOC~~ SOSY
30.0000 mg | PREFILLED_SYRINGE | Freq: Once | SUBCUTANEOUS | Status: AC
  Administered 2019-06-20: 30 mg via SUBCUTANEOUS

## 2019-06-20 NOTE — Progress Notes (Signed)
All questions were answered by the patient before medication was administered. Have you been hospitalized in the last 10 days? No Do you have a fever? No Do you have a cough? No Do you have a headache or sore throat? No  

## 2019-06-23 DIAGNOSIS — F3175 Bipolar disorder, in partial remission, most recent episode depressed: Secondary | ICD-10-CM | POA: Diagnosis not present

## 2019-06-27 ENCOUNTER — Other Ambulatory Visit: Payer: Self-pay

## 2019-06-27 ENCOUNTER — Emergency Department (HOSPITAL_COMMUNITY)
Admission: EM | Admit: 2019-06-27 | Discharge: 2019-06-27 | Disposition: A | Discharge status: Home / Self Care | Payer: BC Managed Care – PPO | Attending: Emergency Medicine | Admitting: Emergency Medicine

## 2019-06-27 ENCOUNTER — Encounter (HOSPITAL_COMMUNITY): Payer: Self-pay

## 2019-06-27 ENCOUNTER — Emergency Department (HOSPITAL_COMMUNITY): Payer: BC Managed Care – PPO

## 2019-06-27 ENCOUNTER — Ambulatory Visit: Payer: BC Managed Care – PPO | Admitting: Internal Medicine

## 2019-06-27 DIAGNOSIS — R062 Wheezing: Secondary | ICD-10-CM | POA: Diagnosis not present

## 2019-06-27 DIAGNOSIS — Z5321 Procedure and treatment not carried out due to patient leaving prior to being seen by health care provider: Secondary | ICD-10-CM | POA: Diagnosis not present

## 2019-06-27 DIAGNOSIS — R0602 Shortness of breath: Secondary | ICD-10-CM | POA: Diagnosis not present

## 2019-06-27 DIAGNOSIS — I10 Essential (primary) hypertension: Secondary | ICD-10-CM | POA: Diagnosis not present

## 2019-06-27 DIAGNOSIS — R457 State of emotional shock and stress, unspecified: Secondary | ICD-10-CM | POA: Diagnosis not present

## 2019-06-27 NOTE — ED Triage Notes (Addendum)
Pt BIB [facility] for eval of SOB onset this AM. Pt w/ hx of COPD and anxiety. Pt does use home O2 prn. States she took her home neb 4x w/ minimal relief. Pt satting well on 2LPM, no obvious respiratory distress. EMS reports some wheezing in the bases. EMS also reports pt has not been taking her xanax, and anxiety often causes slight exacerbations in her SOB. Pt appears comfortable on 2LPM

## 2019-06-28 ENCOUNTER — Other Ambulatory Visit: Payer: Self-pay | Admitting: Internal Medicine

## 2019-07-07 DIAGNOSIS — R5383 Other fatigue: Secondary | ICD-10-CM | POA: Diagnosis not present

## 2019-07-07 DIAGNOSIS — E7800 Pure hypercholesterolemia, unspecified: Secondary | ICD-10-CM | POA: Diagnosis not present

## 2019-07-07 DIAGNOSIS — R350 Frequency of micturition: Secondary | ICD-10-CM | POA: Diagnosis not present

## 2019-07-07 DIAGNOSIS — D509 Iron deficiency anemia, unspecified: Secondary | ICD-10-CM | POA: Diagnosis not present

## 2019-07-07 DIAGNOSIS — E538 Deficiency of other specified B group vitamins: Secondary | ICD-10-CM | POA: Diagnosis not present

## 2019-07-07 DIAGNOSIS — F329 Major depressive disorder, single episode, unspecified: Secondary | ICD-10-CM | POA: Diagnosis not present

## 2019-07-07 DIAGNOSIS — J441 Chronic obstructive pulmonary disease with (acute) exacerbation: Secondary | ICD-10-CM | POA: Diagnosis not present

## 2019-07-07 DIAGNOSIS — E559 Vitamin D deficiency, unspecified: Secondary | ICD-10-CM | POA: Diagnosis not present

## 2019-07-07 DIAGNOSIS — M129 Arthropathy, unspecified: Secondary | ICD-10-CM | POA: Diagnosis not present

## 2019-07-07 DIAGNOSIS — Z20822 Contact with and (suspected) exposure to covid-19: Secondary | ICD-10-CM | POA: Diagnosis not present

## 2019-07-07 DIAGNOSIS — Z79899 Other long term (current) drug therapy: Secondary | ICD-10-CM | POA: Diagnosis not present

## 2019-07-07 DIAGNOSIS — Z131 Encounter for screening for diabetes mellitus: Secondary | ICD-10-CM | POA: Diagnosis not present

## 2019-07-07 DIAGNOSIS — Z9181 History of falling: Secondary | ICD-10-CM | POA: Diagnosis not present

## 2019-07-11 DIAGNOSIS — J449 Chronic obstructive pulmonary disease, unspecified: Secondary | ICD-10-CM | POA: Diagnosis not present

## 2019-07-14 DIAGNOSIS — F3175 Bipolar disorder, in partial remission, most recent episode depressed: Secondary | ICD-10-CM | POA: Diagnosis not present

## 2019-07-15 ENCOUNTER — Other Ambulatory Visit: Payer: Self-pay

## 2019-07-15 ENCOUNTER — Ambulatory Visit (INDEPENDENT_AMBULATORY_CARE_PROVIDER_SITE_OTHER): Payer: BC Managed Care – PPO | Admitting: Primary Care

## 2019-07-15 ENCOUNTER — Encounter: Payer: Self-pay | Admitting: Primary Care

## 2019-07-15 ENCOUNTER — Telehealth: Payer: Self-pay | Admitting: Internal Medicine

## 2019-07-15 DIAGNOSIS — J449 Chronic obstructive pulmonary disease, unspecified: Secondary | ICD-10-CM

## 2019-07-15 MED ORDER — EPINEPHRINE 0.3 MG/0.3ML IJ SOAJ: 0.3000 mg | INTRAMUSCULAR | 1 refills | Status: DC | PRN

## 2019-07-15 MED ORDER — PREDNISONE 10 MG PO TABS: ORAL_TABLET | ORAL | 0 refills | Status: DC

## 2019-07-15 NOTE — Patient Instructions (Signed)
COPD/asthma - Continue Symbicort 160 two puffs twice daily; Spiriva Respimat 2 puffs once daily - Continue Fasenra injections every 8 weeks as scheduled - Continue Singulair 10 mg at bedtime - Rx prednisone taper (40 mg x 3 days, 30 mg x 3 days, 20 mg x 3 days, 10 mg x 3 days, 5 mg x 3 days then stop); Epipen 0.3mg /0.25ml refilled d/t being out of date   Follow Up Instructions:   - FU already scheduled with Dr. Melvyn Novas 7/16

## 2019-07-15 NOTE — Progress Notes (Signed)
Virtual Visit via Video Note  I connected with Julia Daniels on 07/15/19 at  4:00 PM EDT by a video enabled telemedicine application and verified that I am speaking with the correct person using two identifiers.  Location: Patient: Home  Provider: Office   I discussed the limitations of evaluation and management by telemedicine and the availability of in person appointments. The patient expressed understanding and agreed to proceed.  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  Patient of Dr. Melvyn Novas, last seen 12/27/2018.  Julia Daniels in August 2020 with marked clinical improvement.  Maintained on Symbicort 160 + Spiriva Respimat 2.46mcg and Fasenra.  She is taking Singular 10mg  daily (denies previous adverse reactions to this medication).  07/15/2019 Contacted today by telephone for acute visit. She feels her breathing has worsened since May. States that it is hard to talk without becoming short of breath. She is under a tremendous amount of stress at home and with work. She had to call EMS one night. States that she was gasping for air, she used two nebulizer's without improvement and it was really scary. She is not that much better expect for when she increases her prednisone. She is currently taking prn prednisone per Dr. Melvyn Novas. She increased dose to 30mg  x 3 days, then 20mg  x 3 days and is currently on 10mg . Her breathing worsened when she tapered from 30mg  to 20mg . She is having  use her nebulizer once a night while at work. She works third shift. She is compliant with Symbicort and Spiriva respimat. She is not using aero chamber. She is taking Singulair 10mg  at bedtime, denies any adverse reactions to this. She is also on Fasenra injections. She missed fasenra appointment d/t work stress. She had to wait 1-2 weeks past when she was due. Her breathing seemed to go down hill after that. She has some cough. She states that EMS thought she might have fluid in her lungs.She had a CXR with PCP earlier last month which showed chronic hyperinflation and Insterstitial/bronchial thicking, no acute abnormality. She was prescroned a course of doxycycline.  She denies overt swelling in legs. She has put on some extra weight, she feels this is to eating a little more and not fluid retention. Needs new prescription for epipen.    Observations/Objective:  - Able to speak in full sentences, no wheezing or cough  TESTING: PFT's 03/06/18  FEV1 1.02 (43 % ) ratio 0.62  p 27 % improvement from saba p nothing prior to study with DLCO  59/63 % corrects to 77  % for alv volume  With ERV 65   Allergy profile 05/24/2018 >  Eos 0.6/  IgE 25 RAST neg     06/27/19 CXR- Chronic hyperinflation and interstitial/bronchial thickening suggesting COPD. No acute abnormality.  Assessment and Plan:  COPD mixed Patient reports increased shortness of breath over the last 1 to 2 months. She feels this may be related to increased stress  from work, however, she did improve with higher dose of prednisone.  She noticed her breathing worsened when tapering prednisone from 30 mg to 20 mg daily.  We will send in an extended prednisone taper.  Plan: - Continue Symbicort 160 two puffs twice daily; Spiriva Respimat 2 puffs once daily - Continue Fasenra injections every 8 weeks as scheduled - Continue Singulair 10 mg at bedtime - Rx prednisone taper (40 mg x 3 days, 30 mg x 3 days, 20 mg x 3 days, 10 mg x 3 days, 5 mg x 3  days then stop); Epipen 0.3mg /0.38ml refilled d/t being out of date   Follow Up Instructions:   - FU already scheduled with Dr. Melvyn Novas 7/16  I discussed the assessment and treatment plan with the patient. The patient was provided an opportunity to ask questions and all were answered. The patient agreed with the plan and demonstrated an understanding of the instructions.   The patient was advised to call back or seek an in-person evaluation if the symptoms worsen or if the condition fails to improve as anticipated.  I provided 30 minutes of non-face-to-face time during this encounter.   Martyn Ehrich, NP

## 2019-07-16 NOTE — Telephone Encounter (Signed)
Called and spoke with Patient.  Patient is scheduled 08/18/19 at [place] for next Fasenra injection.  Nothing further at this time.

## 2019-07-21 DIAGNOSIS — Z9189 Other specified personal risk factors, not elsewhere classified: Secondary | ICD-10-CM | POA: Diagnosis not present

## 2019-07-21 DIAGNOSIS — J441 Chronic obstructive pulmonary disease with (acute) exacerbation: Secondary | ICD-10-CM | POA: Diagnosis not present

## 2019-07-21 DIAGNOSIS — E559 Vitamin D deficiency, unspecified: Secondary | ICD-10-CM | POA: Diagnosis not present

## 2019-07-21 DIAGNOSIS — D509 Iron deficiency anemia, unspecified: Secondary | ICD-10-CM | POA: Diagnosis not present

## 2019-07-25 ENCOUNTER — Other Ambulatory Visit: Payer: Self-pay

## 2019-07-25 ENCOUNTER — Ambulatory Visit: Payer: BC Managed Care – PPO | Admitting: Internal Medicine

## 2019-07-25 ENCOUNTER — Encounter: Payer: Self-pay | Admitting: Internal Medicine

## 2019-07-25 DIAGNOSIS — I10 Essential (primary) hypertension: Secondary | ICD-10-CM

## 2019-07-25 DIAGNOSIS — J449 Chronic obstructive pulmonary disease, unspecified: Secondary | ICD-10-CM

## 2019-07-25 MED ORDER — FAMOTIDINE 20 MG PO TABS: ORAL_TABLET | ORAL | Status: DC

## 2019-07-25 MED ORDER — ALBUTEROL SULFATE HFA 108 (90 BASE) MCG/ACT IN AERS: 2.0000 | INHALATION_SPRAY | Freq: Four times a day (QID) | RESPIRATORY_TRACT | 5 refills | Status: AC | PRN

## 2019-07-25 MED ORDER — PREDNISONE 10 MG PO TABS: ORAL_TABLET | ORAL | 0 refills | Status: DC

## 2019-07-25 MED ORDER — PANTOPRAZOLE SODIUM 40 MG PO TBEC: 40.0000 mg | DELAYED_RELEASE_TABLET | Freq: Every day | ORAL | 11 refills | Status: AC

## 2019-07-25 NOTE — Progress Notes (Signed)
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 Pulmonary Consult    Self referral for COPD. Pt was dxed with COPD 2 43yrs ago. She has been seen in the past by Dr Verdie Mosher. She states she gets tired easily. She gets winded with carrying something heavy and some days with minimal walking. She uses proair once daily on average and neb with albuterol 3-4 x per wk.   Dyspnea:  Best days doe = MMRC3 = can't walk 100 yards even at a slow pace at a flat grade s stopping due to sob  / better p a few hours but highly variable s pattern  Cough: better since stopped smoking Sleep: bed is flat/ 2 pillows / no am flare  SABA use: neb helps the most  rec Plan A = Automatic = symbicort 160 Take 2 puffs first thing in am and then another 2 puffs about 12 hours later.  Work on inhaler technique:  Plan B = Backup Only use your albuterol as a rescue medication Plan C = Crisis - only use your albuterol nebulizer if you first try Plan B and it fails to help  Plan D = Deltasone  If worse despite A thru C >  Try Deltasone = Prednisone 10 mg take  4 each am x 2 days,   2 each am x 2 days,  1 each am x 2 days and stop       11/08/2017  f/u ov/Deylan Canterbury re:  GOLD III / no better on symb 160 2bid  Chief Complaint  Patient presents with  . Follow-up    c/o sob with exertion, using rescue inhaler daily.   Dyspnea:  Not really much better, never used prednisone as rec  Cough:  no Sleeping: bed flat 2 pillows  SABA use: about twice daily hfa and neb  02: none  rec Go ahead and take the prednisone you have on hand> a lot better and maintained on symb 160 2bid   Work on inhaler technique:  Only use your albuterol as a rescue medication        01/18/2018  f/u ov/Delphin Funes re:  Copd GOLD III  symbicort / spiriva handihaler  Still on pred/ last day Chief Complaint  Patient presents with  . Follow-up    Breathing is okay today. She has had another hospital stay since last visit for increased SOB. She has not use her rescue inhaler or neb since last hospitalization.   Dyspnea:  MMRC2 = can't walk a nl pace on a flat grade s sob but does fine slow and flat  Cough: none Sleeping:  Bed flat/ one pillow  SABA use: none now   02: none  Last flare occurred at work per H&P notes and she did not respond initially to saba  rec Plan A = Automatic = symbicort 160 Take 2 puffs  and spiriva  first thing when you wake up  and then another 2 puffs of symbicort about 12 hours later. Also add Pantoprazole (protonix) 40 mg   Take  30-60 min before first meal of the day and Pepcid (famotidine)  20 mg one @  bedtime until return to office - this is the best way to tell whether stomach acid is contributing to your problem.   Work on inhaler technique:  Plan B = Backup Only use your albuterol as a rescue medication Plan C = Crisis - only use your albuterol nebulizer if you first try Plan B and it fails to help > ok to use the nebulizer up to every 4 hours but if start needing it regularly call for immediate appointment Plan D = Deltasone  If worse despite A thru C >  Try Deltasone = Prednisone 10 mg take  4 each am x 2 days,   2 each am x 2 days,  1 each am x 2 days and stop    GERD (REFLUX)  is an extremely common cause of respiratory symptoms just like yours , many times with no obvious heartburn at all.  Please keep appt for pfts  with all medications /inhalers/ solutions in hand so we  can verify exactly what you are taking. This includes all medications from all doctors and over the counters        07/03/2018  f/u ov/Asherah Lavoy re:  ACOS / freq prednisone need despite sym 160/spiriva smi and singulair max rx for gerd and now 100% adherent as far as can be detected  Chief Complaint  Patient presents with  . Follow-up    Started having increased SOB approx 1 wk ago- had to use neb and rescue inhaler and so started on prednisone and currently taking 30 mg- breathing has improved some.   Dyspnea:  On prednisone MMRC2 = can't walk a nl pace on a flat grade s sob but does fine slow and flat  And off prednisone x sev weeks  > sob at rest so presently back on pred still 30 mg daily  Cough: more at work / min prod mucoid  Sleeping: ok cool room / bed blocks  SABA use: none  02: only when flaring rec We will see if we can qualify you for Fasenra injections > started 08/28/18 Work on inhaler technique:      10/03/2018  f/u ov/Eriq Hufford re: ACOS  Chief Complaint  Patient presents with  . Follow-up    Breathing is much improved on Fascenra. She rarely uses her albuterol inhaler or neb.   Dyspnea:  MMRC2 = can't walk a nl pace on a flat grade s sob but does fine slow and flat  Cough: still some spells  Sleeping: fine rec Stop your singulair when you finish the bottle, ok to resume if you notice any worse respiratory symptoms   12/27/2018  f/u ov/Langley Ingalls re: acos/ on fosrenra/ symb/spiriva  Chief Complaint  Patient presents with  . Follow-up    COPD GOLD ? III with increased Eos and reversibility ? ACOS ?  Dyspnea:  Walking at work / walks across parking/ no steps = MMRC1 = can walk nl pace, flat grade, can't hurry or go uphills or steps s sob   Cough: better  Sleeping: fine on bed blocks x 8 in SABA use: none 02: none  rec Try off pantoprazole and change pepcid to 20 mg after bfast and supper x one month then try off the  morning dose   Work on inhaler technique:  relax and gently  blow all the way out then take a nice smooth deep breath back in, triggering the inhaler at same time you start breathing in.   Rinse and gargle with water when done      No other changes in your medications but you are a good candidate for BRESTRI   Televisit/ NP  07/15/19 for aecopd rec - Continue Symbicort 160 two puffs twice daily; Spiriva Respimat 2 puffs once daily - Continue Fasenra injections every 8 weeks as scheduled - Continue Singulair 10 mg at bedtime - Rx prednisone taper (40 mg x 3 days, 30 mg x 3 days, 20 mg x 3 days, 10 mg x 3 days, 5 mg x 3 days then stop); Epipen 0.3mg /0.43ml refilled d/t being out of date      07/25/2019  f/u ov/Landry Lookingbill [id number]  symptoms worse even before missed a dose of fosenra but received 06/20/19 / no longer on gerd rx and tendency to noct flares up Chief Complaint  Patient presents with  . Follow-up  Dyspnea:  Walking all over one floor at work ok but parking lot to building stops half way Cough: none Sleeping: bed blocks / still with noct flares SABA use: last 3 weeks needing neb as well  02: prn    No obvious day to day or daytime variability or assoc excess/ purulent sputum or mucus plugs or hemoptysis or cp or chest tightness, subjective wheeze or overt sinus or hb symptoms.    Also denies any obvious fluctuation of symptoms with weather or environmental changes or other aggravating or alleviating factors except as outlined above   No unusual exposure hx or h/o childhood pna/ asthma or knowledge of premature birth.  Current Allergies, Complete Past Medical History, Past Surgical History, Family History, and Social History were reviewed in [employer] record.  ROS  The following are not active complaints unless bolded Hoarseness, sore throat, dysphagia, dental problems, itching, sneezing,  nasal congestion or discharge of excess mucus or purulent secretions, ear ache,   fever, chills, sweats, unintended wt loss or wt  gain, classically pleuritic or exertional cp,  orthopnea pnd or arm/hand swelling  or leg swelling, presyncope, palpitations, abdominal pain, anorexia, nausea, vomiting, diarrhea  or change in bowel habits or change in bladder habits, change in stools or change in urine, dysuria, hematuria,  rash, arthralgias, visual complaints, headache, numbness, weakness or ataxia or problems with walking or coordination,  change in mood or  memory.        Current Meds  Medication Sig  . albuterol (PROAIR HFA) 108 (90 Base) MCG/ACT inhaler Inhale 2 puffs into the lungs every 6 (six) hours as needed for wheezing or shortness of breath.  Marland Kitchen albuterol (PROVENTIL) (2.5 MG/3ML) 0.083% nebulizer solution Take 3 mLs (2.5 mg total) by nebulization every 6 (six) hours as needed for wheezing or shortness of breath.  . ALPRAZolam (XANAX) 0.25 MG tablet Take 0.25 mg by mouth daily as needed for anxiety.   . bismuth subsalicylate (PEPTO BISMOL) 262 MG chewable tablet Chew 262-524 mg by mouth daily as needed for diarrhea or loose stools.  . budesonide-formoterol (SYMBICORT) 160-4.5 MCG/ACT inhaler INHALE 2 PUFFS INTO LUNGS TWICE A DAY  . EPINEPHrine 0.3 mg/0.3 mL IJ SOAJ injection Inject 0.3 mLs (0.3 mg total) into the muscle as needed for anaphylaxis.  . famotidine (PEPCID) 20 MG tablet One after bfast and one after supper  . FASENRA  30 MG/ML SOSY INJECT 1 SYRINGE UNDER THE SKIN EVERY 8 WEEKS.  . lamoTRIgine (LAMICTAL) 200 MG tablet Take 200 mg by mouth at bedtime.   Marland Kitchen loratadine (CLARITIN) 10 MG tablet Take 10 mg by mouth daily.  . methylphenidate 54 MG PO CR tablet Take 72 mg by mouth every morning.  . montelukast (SINGULAIR) 10 MG tablet Take 1 tablet (10 mg total) by mouth at bedtime.  .      .    . QUEtiapine (SEROQUEL) 50 MG tablet Take 100 mg by mouth at bedtime.  . risperiDONE (RISPERDAL) 1 MG tablet Take 3 mg by mouth at bedtime.   Marland Kitchen Spacer/Aero-Holding Chambers (AEROCHAMBER MV) inhaler Use as instructed  .  Tiotropium Bromide Monohydrate (SPIRIVA RESPIMAT) 2.5 MCG/ACT AERS Inhale 2 puffs into the lungs daily.  Marland Kitchen venlafaxine XR (EFFEXOR-XR) 150 MG 24 hr capsule Take 150 mg by mouth at bedtime.   . Vitamin D, Ergocalciferol, (DRISDOL) 50000 units CAPS capsule Take 50,000 Units by mouth every Saturday.   . [DISCONTINUED] albuterol (PROAIR HFA) 108 (90 Base) MCG/ACT inhaler Inhale 2 puffs into the lungs every 6 (six) hours as needed for wheezing or shortness of breath.  .     .    .                 Objective:     07/25/2019      193  12/27/2018      193 10/03/2018        199  07/03/2018        205  05/24/2018        204  02/11/2018          197   01/18/2018        199   12/04/2017     191   11/08/17 189 lb (85.7 kg)  10/31/17 187 lb (84.8 kg)  09/18/17 179 lb 0.2 oz (81.2 kg)     Vital signs reviewed  07/25/2019  - Note at rest 02 sats  94% on RA  / note bp 160/90    reported:full dentures      HEENT : pt wearing mask not removed for exam due to covid - 19 concerns.    NECK :  without JVD/Nodes/TM/ nl carotid upstrokes bilaterally   LUNGS: no acc muscle use,  Mild barrel  contour chest wall with bilateral  Distant bs s audible wheeze and  without cough on insp or exp maneuvers  and mild  Hyperresonant  to  percussion bilaterally     CV:  RRR  no s3 or murmur or increase in P2, and no edema   ABD:  Obese/ soft and nontender with pos end  insp Hoover's  in the supine position. No bruits or organomegaly appreciated, bowel sounds nl  MS:   Nl gait/  ext warm without deformities, calf tenderness, cyanosis or clubbing No obvious joint restrictions   SKIN: warm and dry without lesions    NEURO:  alert, approp, nl sensorium with  no motor or cerebellar deficits apparent.                  Assessment

## 2019-07-25 NOTE — Patient Instructions (Signed)
Pantoprazole (protonix) 40 mg   Take  30-60 min before first meal of the day and Pepcid (famotidine)  20 mg one @  bedtime until return to office - this is the best way to tell whether stomach acid is contributing to your problem.     GERD (REFLUX)  is an extremely common cause of respiratory symptoms just like yours , many times with no obvious heartburn at all.    It can be treated with medication, but also with lifestyle changes including elevation of the head of your bed (ideally with 6 -8inch blocks under the headboard of your bed),  Smoking cessation, avoidance of late meals, excessive alcohol, and avoid fatty foods, chocolate, peppermint, colas, red wine, and acidic juices such as orange juice.  NO MINT OR MENTHOL PRODUCTS SO NO COUGH DROPS  USE SUGARLESS CANDY INSTEAD (Jolley ranchers or Stover's or Life Savers) or even ice chips will also do - the key is to swallow to prevent all throat clearing. NO OIL BASED VITAMINS - use powdered substitutes.  Avoid fish oil when coughing.    Take Prednisone  4 for three days 3 for three days 2 for three days 1 for three days and stop    Work on inhaler technique:  relax and gently blow all the way out then take a nice smooth deep breath back in, triggering the inhaler at same time you start breathing in.  Hold for up to 5 seconds if you can. Blow symbicort out thru nose. Rinse and gargle with water when done

## 2019-07-27 ENCOUNTER — Encounter: Payer: Self-pay | Admitting: Internal Medicine

## 2019-07-27 DIAGNOSIS — I10 Essential (primary) hypertension: Secondary | ICD-10-CM | POA: Insufficient documentation

## 2019-07-27 NOTE — Assessment & Plan Note (Signed)
Quit smoking 08/2017  Spirometry 10/31/2017  FEV1 0.7 (30%)  Ratio 51 p Breo in am   - 10/31/2017  Eos = 0.9  - alpha one AT screen 10/31/2017   MS level 121  - 10/31/2017   Walked RA x one lap @ 185 stopped due to sob s desats at fast pace   - 01/18/2018   Walked RA  2 laps @ 235ft each @ fast  pace  stopped due to sob no desat  - Spirometry 01/18/2018  FEV1 1.1 (49%)  Ratio 63 with mild curvature after am spiriva and symbicort  - 02/11/2018    added prednisone as plan D / changed spiriva to smi - PFT's 03/06/18  FEV1 1.02 (43 % ) ratio 0.62  p 27 % improvement from saba p nothing prior to study with DLCO  59/63 % corrects to 77  % for alv volume  With ERV 65  - Singulair 03/14/2018 >> d/c'd 10/10/2018 s adverse effects - Allergy profile 05/24/2018 >  Eos 0.6/  IgE 25 RAST neg    - 07/03/2018  After extensive coaching inhaler device,  effectiveness =    90% finally and still flares w/in a week or two off prednisone so rec fasenra trial and continue pred with ceiling of 20 and floor of 0 as plan D -  08/28/18 started fasenra > marked clinical improvement -  07/25/2019  After extensive coaching inhaler device,  effectiveness =    75% (short Ti)    Group D in terms of symptom/risk and laba/lama/ICS  therefore appropriate rx at this point >>>  Continue symb/spirva and add back gerd rx  Plus  Also added 6 days of Prednisone in case of component of Th-2 driven upper or lower airways inflammation (if cough responds short term only to relapse befor return while will on rx for uacs that would point to allergic rhinitis/ asthma or eos bronchitis)

## 2019-07-27 NOTE — Assessment & Plan Note (Addendum)
Noted 07/25/2019   >>> May just be white coat hypertension as she tends to be anxious but may also be at risk of diastolic dysfunction / cardiac asthma > f/u with PCP and low threshold to rx / check echo if her ACOS continues to be difficult to manage         Each maintenance medication was reviewed in detail including emphasizing most importantly the difference between maintenance and prns and under what circumstances the prns are to be triggered using an action plan format where appropriate.  Total time for H and P, chart review, counseling, teaching device and generating customized AVS unique to this office visit / charting = 20 min

## 2019-08-01 ENCOUNTER — Telehealth: Payer: Self-pay | Admitting: Internal Medicine

## 2019-08-05 NOTE — Telephone Encounter (Signed)
No previous notification had been received about prior authorization renewal.  Submitted a Prior Authorization request to [place] for [facility] via Cover My Meds. Will update once we receive a response.  Requested urgent determination.  (Key: [id number])

## 2019-08-05 NOTE — Telephone Encounter (Signed)
Amber or Rachael, please advise if PA has been reinitiated for pt's Fasenra.

## 2019-08-11 ENCOUNTER — Telehealth: Payer: Self-pay | Admitting: Internal Medicine

## 2019-08-11 DIAGNOSIS — J449 Chronic obstructive pulmonary disease, unspecified: Secondary | ICD-10-CM | POA: Diagnosis not present

## 2019-08-11 NOTE — Telephone Encounter (Signed)
I just looked in [facility] and it says they will fax a determination to the office. Just routing this to you to follow up on

## 2019-08-11 NOTE — Telephone Encounter (Signed)
[name] Order: 30mg  #1 prefilled syringe Ordered date: 08/11/2019 Expected date of arrival: 08/12/2019 Ordered by: Desmond Dike, [place]  Specialty Pharmacy: [facility]

## 2019-08-12 NOTE — Telephone Encounter (Signed)
Received notification from [facility] regarding a prior authorization for [facility]- [id number]. Authorization has been APPROVED from 08/05/19 to 08/04/20.   Approved For 7 units  Authorization # [id number] Phone # [phone]  Called and provided information to [facility].

## 2019-08-13 NOTE — Telephone Encounter (Signed)
Called [facility] to follow up on pt's shipment as we did not receive it yesterday. Rep I spoke with advised me that she needs an updated PA. Will route to the pharmacy team to handle.  LMTCB x1 for pt to make her aware that her appointment will need to be canceled.

## 2019-08-13 NOTE — Telephone Encounter (Signed)
We do have samples. We will use one to prevent delay.

## 2019-08-13 NOTE — Telephone Encounter (Signed)
PA Approval details were called into [facility] yesterday. Called to check status rep advised that since it is a medical benefit PA they will have to call insurance to verify. Rep states they will try to complete and deliver shipment by 08/15/19.  Do we have any samples that can be used to prevent delay?

## 2019-08-18 ENCOUNTER — Other Ambulatory Visit: Payer: Self-pay

## 2019-08-18 ENCOUNTER — Other Ambulatory Visit: Payer: Self-pay | Admitting: *Deleted

## 2019-08-18 ENCOUNTER — Ambulatory Visit (INDEPENDENT_AMBULATORY_CARE_PROVIDER_SITE_OTHER): Payer: BC Managed Care – PPO

## 2019-08-18 DIAGNOSIS — J4550 Severe persistent asthma, uncomplicated: Secondary | ICD-10-CM

## 2019-08-18 DIAGNOSIS — J449 Chronic obstructive pulmonary disease, unspecified: Secondary | ICD-10-CM

## 2019-08-18 MED ORDER — ALBUTEROL SULFATE (2.5 MG/3ML) 0.083% IN NEBU: 2.5000 mg | INHALATION_SOLUTION | Freq: Four times a day (QID) | RESPIRATORY_TRACT | 12 refills | Status: DC | PRN

## 2019-08-18 MED ORDER — SPIRIVA RESPIMAT 2.5 MCG/ACT IN AERS: 2.0000 | INHALATION_SPRAY | Freq: Every day | RESPIRATORY_TRACT | 1 refills | Status: DC

## 2019-08-18 MED ORDER — BENRALIZUMAB 30 MG/ML ~~LOC~~ SOSY
30.0000 mg | PREFILLED_SYRINGE | Freq: Once | SUBCUTANEOUS | Status: AC
Start: 2019-08-18 — End: 2019-08-18
  Administered 2019-08-18: 30 mg via SUBCUTANEOUS

## 2019-08-18 NOTE — Progress Notes (Signed)
All questions were answered by the patient before medication was administered. Have you been hospitalized in the last 10 days? No Do you have a fever? No Do you have a cough? No Do you have a headache or sore throat? No  

## 2019-08-20 DIAGNOSIS — F3175 Bipolar disorder, in partial remission, most recent episode depressed: Secondary | ICD-10-CM | POA: Diagnosis not present

## 2019-08-21 NOTE — Telephone Encounter (Signed)
Called [facility], provided PA approval details again. Their benefits team will call to verify and will then set up shipment.

## 2019-08-21 NOTE — Telephone Encounter (Signed)
Rodney.Moros w/[facility] following up on PA for [place].  Ph:  [phone].  Fax:  [phone]

## 2019-08-25 DIAGNOSIS — M62830 Muscle spasm of back: Secondary | ICD-10-CM | POA: Diagnosis not present

## 2019-08-25 NOTE — Telephone Encounter (Signed)
Called [facility] to check status. They still have not verified plan. Called patient's insurance and verifies authorization details. They advised to tell pharmacy that they will need to call to verify auth details and not use [employer] website.   [facility]ed [facility] and advised they need to call insurance to verify benefits. Will follow up.

## 2019-08-26 ENCOUNTER — Telehealth: Payer: Self-pay | Admitting: Internal Medicine

## 2019-08-26 MED ORDER — SPIRIVA RESPIMAT 2.5 MCG/ACT IN AERS: 2.0000 | INHALATION_SPRAY | Freq: Every day | RESPIRATORY_TRACT | 2 refills | Status: DC

## 2019-08-26 MED ORDER — BUDESONIDE-FORMOTEROL FUMARATE 160-4.5 MCG/ACT IN AERO: INHALATION_SPRAY | RESPIRATORY_TRACT | 2 refills | Status: DC

## 2019-08-26 NOTE — Telephone Encounter (Signed)
Pt requesting 3 month supply refills on symbicort and spiriva.  This has been sent to preferred pharmacy.  Nothing further needed at this time- will close encounter.

## 2019-08-27 ENCOUNTER — Other Ambulatory Visit: Payer: Self-pay

## 2019-08-27 ENCOUNTER — Telehealth: Payer: Self-pay | Admitting: Pulmonary Disease

## 2019-08-27 ENCOUNTER — Inpatient Hospital Stay (HOSPITAL_COMMUNITY)
Admission: EM | Admit: 2019-08-27 | Discharge: 2019-09-02 | DRG: 190 | Disposition: A | Discharge status: Home Health | Payer: BC Managed Care – PPO | Attending: Internal Medicine | Admitting: Internal Medicine

## 2019-08-27 ENCOUNTER — Encounter (HOSPITAL_COMMUNITY): Payer: Self-pay | Admitting: Emergency Medicine

## 2019-08-27 ENCOUNTER — Emergency Department (HOSPITAL_COMMUNITY): Payer: BC Managed Care – PPO

## 2019-08-27 DIAGNOSIS — R030 Elevated blood-pressure reading, without diagnosis of hypertension: Secondary | ICD-10-CM | POA: Diagnosis present

## 2019-08-27 DIAGNOSIS — D72829 Elevated white blood cell count, unspecified: Secondary | ICD-10-CM | POA: Diagnosis present

## 2019-08-27 DIAGNOSIS — F39 Unspecified mood [affective] disorder: Secondary | ICD-10-CM | POA: Diagnosis not present

## 2019-08-27 DIAGNOSIS — R7303 Prediabetes: Secondary | ICD-10-CM | POA: Diagnosis present

## 2019-08-27 DIAGNOSIS — J449 Chronic obstructive pulmonary disease, unspecified: Secondary | ICD-10-CM | POA: Diagnosis not present

## 2019-08-27 DIAGNOSIS — R739 Hyperglycemia, unspecified: Secondary | ICD-10-CM | POA: Diagnosis not present

## 2019-08-27 DIAGNOSIS — R079 Chest pain, unspecified: Secondary | ICD-10-CM | POA: Diagnosis not present

## 2019-08-27 DIAGNOSIS — E669 Obesity, unspecified: Secondary | ICD-10-CM | POA: Diagnosis present

## 2019-08-27 DIAGNOSIS — F419 Anxiety disorder, unspecified: Secondary | ICD-10-CM | POA: Diagnosis not present

## 2019-08-27 DIAGNOSIS — J4550 Severe persistent asthma, uncomplicated: Secondary | ICD-10-CM | POA: Diagnosis not present

## 2019-08-27 DIAGNOSIS — J9621 Acute and chronic respiratory failure with hypoxia: Secondary | ICD-10-CM | POA: Diagnosis not present

## 2019-08-27 DIAGNOSIS — J9811 Atelectasis: Secondary | ICD-10-CM | POA: Diagnosis not present

## 2019-08-27 DIAGNOSIS — J90 Pleural effusion, not elsewhere classified: Secondary | ICD-10-CM | POA: Diagnosis not present

## 2019-08-27 DIAGNOSIS — Z90710 Acquired absence of both cervix and uterus: Secondary | ICD-10-CM | POA: Diagnosis not present

## 2019-08-27 DIAGNOSIS — Z20822 Contact with and (suspected) exposure to covid-19: Secondary | ICD-10-CM | POA: Diagnosis present

## 2019-08-27 DIAGNOSIS — F319 Bipolar disorder, unspecified: Secondary | ICD-10-CM | POA: Diagnosis not present

## 2019-08-27 DIAGNOSIS — R0602 Shortness of breath: Secondary | ICD-10-CM | POA: Diagnosis not present

## 2019-08-27 DIAGNOSIS — Z79899 Other long term (current) drug therapy: Secondary | ICD-10-CM | POA: Diagnosis not present

## 2019-08-27 DIAGNOSIS — R52 Pain, unspecified: Secondary | ICD-10-CM | POA: Diagnosis not present

## 2019-08-27 DIAGNOSIS — R5381 Other malaise: Secondary | ICD-10-CM | POA: Diagnosis present

## 2019-08-27 DIAGNOSIS — Z6834 Body mass index (BMI) 34.0-34.9, adult: Secondary | ICD-10-CM | POA: Diagnosis not present

## 2019-08-27 DIAGNOSIS — Z7951 Long term (current) use of inhaled steroids: Secondary | ICD-10-CM

## 2019-08-27 DIAGNOSIS — M5489 Other dorsalgia: Secondary | ICD-10-CM | POA: Diagnosis not present

## 2019-08-27 DIAGNOSIS — R4182 Altered mental status, unspecified: Secondary | ICD-10-CM

## 2019-08-27 DIAGNOSIS — J441 Chronic obstructive pulmonary disease with (acute) exacerbation: Principal | ICD-10-CM | POA: Diagnosis present

## 2019-08-27 DIAGNOSIS — Z87891 Personal history of nicotine dependence: Secondary | ICD-10-CM | POA: Diagnosis not present

## 2019-08-27 DIAGNOSIS — R0789 Other chest pain: Secondary | ICD-10-CM | POA: Diagnosis not present

## 2019-08-27 DIAGNOSIS — K219 Gastro-esophageal reflux disease without esophagitis: Secondary | ICD-10-CM | POA: Diagnosis not present

## 2019-08-27 DIAGNOSIS — F329 Major depressive disorder, single episode, unspecified: Secondary | ICD-10-CM | POA: Diagnosis not present

## 2019-08-27 LAB — BASIC METABOLIC PANEL
Anion gap: 10 (ref 5–15)
BUN: 18 mg/dL (ref 8–23)
CO2: 27 mmol/L (ref 22–32)
Calcium: 10.1 mg/dL (ref 8.9–10.3)
Chloride: 99 mmol/L (ref 98–111)
Creatinine, Ser: 0.75 mg/dL (ref 0.44–1.00)
GFR calc Af Amer: >60 mL/min (ref >60)
GFR calc non Af Amer: >60 mL/min (ref >60)
Glucose, Bld: 134 mg/dL — ABNORMAL HIGH (ref 70–99)
Potassium: 4.5 mmol/L (ref 3.5–5.1)
Sodium: 136 mmol/L (ref 135–145)

## 2019-08-27 LAB — CBC
HCT: 40.1 % (ref 36.0–46.0)
Hemoglobin: 12.9 g/dL (ref 12.0–15.0)
MCH: 32.7 pg (ref 26.0–34.0)
MCHC: 32.2 g/dL (ref 30.0–36.0)
MCV: 101.5 fL — ABNORMAL HIGH (ref 80.0–100.0)
Platelets: 290 10*3/uL (ref 150–400)
RBC: 3.95 MIL/uL (ref 3.87–5.11)
RDW: 15 % (ref 11.5–15.5)
WBC: 19.1 10*3/uL — ABNORMAL HIGH (ref 4.0–10.5)
nRBC: 0 % (ref 0.0–0.2)

## 2019-08-27 LAB — SARS CORONAVIRUS 2 BY RT PCR (HOSPITAL ORDER, PERFORMED IN ~~LOC~~ HOSPITAL LAB): SARS Coronavirus 2: NEGATIVE

## 2019-08-27 MED ORDER — IPRATROPIUM-ALBUTEROL 0.5-2.5 (3) MG/3ML IN SOLN
3.0000 mL | Freq: Four times a day (QID) | RESPIRATORY_TRACT | Status: DC
  Administered 2019-08-27 – 2019-08-28 (×5): 3 mL via RESPIRATORY_TRACT
  Filled 2019-08-27 (×5): qty 3

## 2019-08-27 MED ORDER — LORATADINE 10 MG PO TABS
10.0000 mg | ORAL_TABLET | Freq: Every day | ORAL | Status: DC
  Administered 2019-08-28 – 2019-09-02 (×6): 10 mg via ORAL
  Filled 2019-08-27 (×6): qty 1

## 2019-08-27 MED ORDER — VENLAFAXINE HCL ER 150 MG PO CP24
150.0000 mg | ORAL_CAPSULE | Freq: Every day | ORAL | Status: DC
  Administered 2019-08-27 – 2019-09-01 (×6): 150 mg via ORAL
  Filled 2019-08-27 (×6): qty 1

## 2019-08-27 MED ORDER — ALPRAZOLAM 0.25 MG PO TABS
0.2500 mg | ORAL_TABLET | Freq: Every day | ORAL | Status: DC | PRN
  Administered 2019-08-28 – 2019-08-29 (×2): 0.25 mg via ORAL
  Filled 2019-08-27 (×3): qty 1

## 2019-08-27 MED ORDER — BUDESONIDE 0.5 MG/2ML IN SUSP
0.5000 mg | Freq: Four times a day (QID) | RESPIRATORY_TRACT | Status: DC
  Administered 2019-08-27: 0.5 mg via RESPIRATORY_TRACT
  Filled 2019-08-27: qty 2

## 2019-08-27 MED ORDER — MONTELUKAST SODIUM 10 MG PO TABS
10.0000 mg | ORAL_TABLET | Freq: Every day | ORAL | Status: DC
  Administered 2019-08-27 – 2019-09-01 (×6): 10 mg via ORAL
  Filled 2019-08-27 (×6): qty 1

## 2019-08-27 MED ORDER — ENOXAPARIN SODIUM 40 MG/0.4ML ~~LOC~~ SOLN
40.0000 mg | SUBCUTANEOUS | Status: DC
  Administered 2019-08-27 – 2019-09-01 (×6): 40 mg via SUBCUTANEOUS
  Filled 2019-08-27 (×6): qty 0.4

## 2019-08-27 MED ORDER — ALBUTEROL (5 MG/ML) CONTINUOUS INHALATION SOLN
5.0000 mg/h | INHALATION_SOLUTION | Freq: Once | RESPIRATORY_TRACT | Status: AC
  Administered 2019-08-27: 5 mg/h via RESPIRATORY_TRACT
  Filled 2019-08-27: qty 20

## 2019-08-27 MED ORDER — MAGNESIUM SULFATE 2 GM/50ML IV SOLN
2.0000 g | Freq: Once | INTRAVENOUS | Status: AC
  Administered 2019-08-27: 2 g via INTRAVENOUS
  Filled 2019-08-27: qty 50

## 2019-08-27 MED ORDER — RACEPINEPHRINE HCL 2.25 % IN NEBU
0.5000 mL | INHALATION_SOLUTION | Freq: Once | RESPIRATORY_TRACT | Status: AC
  Administered 2019-08-27: 0.5 mL via RESPIRATORY_TRACT
  Filled 2019-08-27: qty 0.5

## 2019-08-27 MED ORDER — IPRATROPIUM BROMIDE 0.02 % IN SOLN
0.5000 mg | Freq: Once | RESPIRATORY_TRACT | Status: AC
  Administered 2019-08-27: 0.5 mg via RESPIRATORY_TRACT
  Filled 2019-08-27: qty 2.5

## 2019-08-27 MED ORDER — PREDNISONE 20 MG PO TABS
40.0000 mg | ORAL_TABLET | Freq: Every day | ORAL | Status: DC
  Administered 2019-08-29: 40 mg via ORAL
  Filled 2019-08-27 (×2): qty 2

## 2019-08-27 MED ORDER — BUDESONIDE 0.5 MG/2ML IN SUSP
0.5000 mg | Freq: Two times a day (BID) | RESPIRATORY_TRACT | Status: DC
  Administered 2019-08-28 – 2019-09-02 (×11): 0.5 mg via RESPIRATORY_TRACT
  Filled 2019-08-27 (×11): qty 2

## 2019-08-27 MED ORDER — OXYCODONE HCL 5 MG PO TABS
5.0000 mg | ORAL_TABLET | Freq: Four times a day (QID) | ORAL | Status: DC | PRN
  Administered 2019-08-27 – 2019-09-02 (×17): 5 mg via ORAL
  Filled 2019-08-27 (×19): qty 1

## 2019-08-27 MED ORDER — QUETIAPINE FUMARATE 50 MG PO TABS
100.0000 mg | ORAL_TABLET | Freq: Every day | ORAL | Status: DC
  Administered 2019-08-27 – 2019-09-01 (×6): 100 mg via ORAL
  Filled 2019-08-27 (×6): qty 2

## 2019-08-27 MED ORDER — DOXYCYCLINE HYCLATE 100 MG PO TABS
100.0000 mg | ORAL_TABLET | Freq: Two times a day (BID) | ORAL | Status: AC
  Administered 2019-08-27 – 2019-08-31 (×10): 100 mg via ORAL
  Filled 2019-08-27 (×10): qty 1

## 2019-08-27 MED ORDER — PANTOPRAZOLE SODIUM 40 MG PO TBEC
40.0000 mg | DELAYED_RELEASE_TABLET | Freq: Every day | ORAL | Status: DC
  Administered 2019-08-28 – 2019-09-02 (×6): 40 mg via ORAL
  Filled 2019-08-27 (×6): qty 1

## 2019-08-27 MED ORDER — LAMOTRIGINE 100 MG PO TABS
200.0000 mg | ORAL_TABLET | Freq: Every day | ORAL | Status: DC
  Administered 2019-08-27 – 2019-09-01 (×6): 200 mg via ORAL
  Filled 2019-08-27 (×6): qty 2

## 2019-08-27 MED ORDER — ACETAMINOPHEN 500 MG PO TABS
1000.0000 mg | ORAL_TABLET | Freq: Three times a day (TID) | ORAL | Status: DC
  Administered 2019-08-27 – 2019-09-02 (×18): 1000 mg via ORAL
  Filled 2019-08-27 (×19): qty 2

## 2019-08-27 MED ORDER — METHYLPHENIDATE HCL ER (OSM) 18 MG PO TBCR
18.0000 mg | EXTENDED_RELEASE_TABLET | Freq: Every day | ORAL | Status: DC
  Administered 2019-08-29 – 2019-09-02 (×4): 18 mg via ORAL
  Filled 2019-08-27 (×5): qty 1

## 2019-08-27 MED ORDER — METHYLPREDNISOLONE SODIUM SUCC 125 MG IJ SOLR
60.0000 mg | Freq: Four times a day (QID) | INTRAMUSCULAR | Status: AC
  Administered 2019-08-27 – 2019-08-28 (×4): 60 mg via INTRAVENOUS
  Filled 2019-08-27 (×4): qty 2

## 2019-08-27 NOTE — ED Provider Notes (Signed)
[facility] DEPT Provider Note   CSN: [id number] Arrival date & time: 08/27/19  0831     History Chief Complaint  Patient presents with  . Shortness of Breath    Julia Daniels is a 69 y.o. female with past medical history significant for COPD on PRN O2, asthma, anxiety, depression.  Patient received Covid vaccinations.  HPI Patient presents to emergency department today via EMS with chief complaint of progressively worsening shortness of breath x 5 days.  Patient is also endorsing a productive cough. She says this feels like a COPD exacerbation. She was given 125 mg Solu-Medrol, 1 Atrovent and 10 albuterol in route. She has not been prescribed steroids in approximately x3 months she thinks. She saw PCP x3 days ago and was given a muscle relaxer.  Denies fever, chills, sore throat, sinus pressure, chest pain, abdominal pain, nausea, vomiting, urinary symptoms, diarrhea, rash. No known sick contacts or covid exposure.     Past Medical History:  Diagnosis Date  . Anxiety   . Asthma   . COPD (chronic obstructive pulmonary disease) ([place])   . Depression     Patient Active Problem List   Diagnosis Date Noted  . Essential hypertension 07/27/2019  . Allergic rhinitis 03/14/2018  . Acute respiratory failure with hypoxia ([place]) 03/08/2018  . Elevated MCV 01/31/2018  . Vitamin B12 deficiency 01/31/2018  . Acute on chronic respiratory failure with hypoxia ([place]) 01/31/2018  . Acute on chronic respiratory failure ([place]) 12/31/2017  . Respiratory failure, acute-on-chronic ([place]) 12/31/2017  . COPD exacerbation ([place]) 09/18/2017  . Hypoxia   . Hyperlipidemia 08/23/2017  . Depression 08/23/2017  . Anxiety 08/23/2017  . Hypercalcemia 08/23/2017  . Thrombocytosis ([place]) 08/23/2017  . COPD with acute exacerbation ([place]) 08/23/2017  . Acute respiratory failure with hypoxia and hypercapnia ([place]) 08/23/2017  . ADD (attention deficit disorder) 08/30/2012  .  Bipolar disorder, unspecified ([place]) 08/30/2012  . COPD  GOLD ? III with increased Eos and reversibility ? ACOS ?  08/30/2012  . Tobacco abuse 08/30/2012    Past Surgical History:  Procedure Laterality Date  . ABDOMINAL HYSTERECTOMY     partial      OB History   No obstetric history on file.     Family History  Problem Relation Age of Onset  . Asthma Mother   . Lung cancer Father   . COPD Father     Social History   Tobacco Use  . Smoking status: Former Smoker    Packs/day: 1.00    Years: 40.00    Pack years: 40.00    Types: Cigarettes    Quit date: 08/23/2017    Years since quitting: 2.0  . Smokeless tobacco: Never Used  Substance Use Topics  . Alcohol use: No  . Drug use: No    Home Medications Prior to Admission medications   Medication Sig Start Date End Date Taking? Authorizing Provider  albuterol (PROAIR HFA) 108 (90 Base) MCG/ACT inhaler Inhale 2 puffs into the lungs every 6 (six) hours as needed for wheezing or shortness of breath. 07/25/19   Tanda Rockers, MD  albuterol (PROVENTIL) (2.5 MG/3ML) 0.083% nebulizer solution Take 3 mLs (2.5 mg total) by nebulization every 6 (six) hours as needed for wheezing or shortness of breath. 08/18/19   Tanda Rockers, MD  ALPRAZolam Duanne Moron) 0.25 MG tablet Take 0.25 mg by mouth daily as needed for anxiety.  07/21/17   [provider]  bismuth subsalicylate (PEPTO BISMOL) 262 MG chewable  tablet Chew 262-524 mg by mouth daily as needed for diarrhea or loose stools.    [provider]  budesonide-formoterol (SYMBICORT) 160-4.5 MCG/ACT inhaler INHALE 2 PUFFS INTO LUNGS TWICE A DAY 08/26/19   Tanda Rockers, MD  EPINEPHrine 0.3 mg/0.3 mL IJ SOAJ injection Inject 0.3 mLs (0.3 mg total) into the muscle as needed for anaphylaxis. 07/15/19   Martyn Ehrich, NP  famotidine (PEPCID) 20 MG tablet One at bedtime 07/25/19   Tanda Rockers, MD  FASENRA 30 MG/ML SOSY INJECT 1 SYRINGE UNDER THE SKIN EVERY 8 WEEKS. 01/20/19    Tanda Rockers, MD  lamoTRIgine (LAMICTAL) 200 MG tablet Take 200 mg by mouth at bedtime.     [provider]  loratadine (CLARITIN) 10 MG tablet Take 10 mg by mouth daily.    [provider]  methylphenidate 54 MG PO CR tablet Take 72 mg by mouth every morning.    [provider]  montelukast (SINGULAIR) 10 MG tablet Take 1 tablet (10 mg total) by mouth at bedtime. 04/17/19   Tanda Rockers, MD  pantoprazole (PROTONIX) 40 MG tablet Take 1 tablet (40 mg total) by mouth daily. Take 30-60 min before first meal of the day 07/25/19   Tanda Rockers, MD  predniSONE (DELTASONE) 10 MG tablet TAKE 2 TAB EVERY MORNING UPON WAKING UNTIL BETTER, THEN TAKE 1 TAB EVERY DAY FOR 1 WEEK THEN STOP 03/06/19   Tanda Rockers, MD  predniSONE (DELTASONE) 10 MG tablet Take 4 for three days 3 for three days 2 for three days 1 for three days and stop 07/25/19   Tanda Rockers, MD  QUEtiapine (SEROQUEL) 50 MG tablet Take 100 mg by mouth at bedtime. 02/28/18   [provider]  risperiDONE (RISPERDAL) 1 MG tablet Take 3 mg by mouth at bedtime.  07/06/17   [provider]  Spacer/Aero-Holding Chambers (AEROCHAMBER MV) inhaler Use as instructed 03/14/18   Lauraine Rinne, NP  Tiotropium Bromide Monohydrate (SPIRIVA RESPIMAT) 2.5 MCG/ACT AERS Inhale 2 puffs into the lungs daily. 08/26/19   Tanda Rockers, MD  venlafaxine XR (EFFEXOR-XR) 150 MG 24 hr capsule Take 150 mg by mouth at bedtime.     [provider]  Vitamin D, Ergocalciferol, (DRISDOL) 50000 units CAPS capsule Take 50,000 Units by mouth every Saturday.  06/18/17   [provider]    Allergies    Sulfa antibiotics  Review of Systems   Review of Systems All other systems are reviewed and are negative for acute change except as noted in the HPI.  Physical Exam Updated Vital Signs BP 109/83   Pulse 91   Temp 98.9 F (37.2 C) (Oral)   Resp 18   Ht 5\' 3"  (1.6 m)   Wt 88.9 kg   SpO2 92%   BMI 34.72 kg/m    Physical Exam Vitals and nursing note reviewed.  Constitutional:      General: She is not in acute distress.    Appearance: She is not ill-appearing.  HENT:     Head: Normocephalic and atraumatic.     Right Ear: Tympanic membrane and external ear normal.     Left Ear: Tympanic membrane and external ear normal.     Nose: Nose normal.     Mouth/Throat:     Mouth: Mucous membranes are moist.     Pharynx: Oropharynx is clear.  Eyes:     General: No scleral icterus.       Right  eye: No discharge.        Left eye: No discharge.     Extraocular Movements: Extraocular movements intact.     Conjunctiva/sclera: Conjunctivae normal.     Pupils: Pupils are equal, round, and reactive to light.  Neck:     Vascular: No JVD.  Cardiovascular:     Rate and Rhythm: Normal rate and regular rhythm.     Pulses: Normal pulses.          Radial pulses are 2+ on the right side and 2+ on the left side.     Heart sounds: Normal heart sounds.     Comments: Tachycardic to 112 Pulmonary:     Comments: Increased work or breathing. Symmetric chest rise. Stridor heard in upper airway. Wheezing heard in posterior lung fields. Oxygen saturation is 92% on 2L. Patient is speaking in short sentences. + Accessory muscle usage Abdominal:     Comments: Abdomen is soft, non-distended, and non-tender in all quadrants. No rigidity, no guarding. No peritoneal signs.  Musculoskeletal:        General: Normal range of motion.     Cervical back: Normal range of motion.  Skin:    General: Skin is warm and dry.     Capillary Refill: Capillary refill takes less than 2 seconds.  Neurological:     Mental Status: She is oriented to person, place, and time.     GCS: GCS eye subscore is 4. GCS verbal subscore is 5. GCS motor subscore is 6.     Comments: Fluent speech, no facial droop.  Psychiatric:        Behavior: Behavior normal.     ED Results / Procedures / Treatments   Labs (all labs ordered are listed, but only  abnormal results are displayed) Labs Reviewed  BASIC METABOLIC PANEL - Abnormal; Notable for the following components:      Result Value   Glucose, Bld 134 (*)    All other components within normal limits  CBC - Abnormal; Notable for the following components:   WBC 19.1 (*)    MCV 101.5 (*)    All other components within normal limits  SARS CORONAVIRUS 2 BY RT PCR (HOSPITAL ORDER, [facility])    EKG None  Radiology DG Chest Port 1 View  Result Date: 08/27/2019 CLINICAL DATA:  69 year old female with shortness of breath for several days. COPD. EXAM: PORTABLE CHEST 1 VIEW COMPARISON:  Chest radiograph 06/27/2019 and earlier. FINDINGS: Portable AP semi upright view at 0905 hours. Mildly lower lung volumes. Stable mediastinal contours. Stable trachea. Allowing for portable technique basilar predominant increased pulmonary interstitial markings appear not significantly changed. Mild linear atelectasis suspected in the periphery of the left mid lung. No pneumothorax or pleural effusion. Paucity of bowel gas in the upper abdomen. No acute osseous abnormality identified. IMPRESSION: Mild atelectasis.  No other acute cardiopulmonary abnormality. Electronically Signed   By: Genevie Ann M.D.   On: 08/27/2019 09:51    Procedures Procedures (including critical care time)  Medications Ordered in ED Medications  magnesium sulfate IVPB 2 g 50 mL (2 g Intravenous New Bag/Given 08/27/19 1320)  albuterol (PROVENTIL,VENTOLIN) solution continuous neb (5 mg/hr Nebulization Given 08/27/19 1018)  ipratropium (ATROVENT) nebulizer solution 0.5 mg (0.5 mg Nebulization Given 08/27/19 1018)  Racepinephrine HCl 2.25 % nebulizer solution 0.5 mL (0.5 mLs Nebulization Given 08/27/19 1311)    ED Course  I have reviewed the triage vital signs and the nursing notes.  Pertinent  labs & imaging results that were available during my care of the patient were reviewed by me and considered in my medical  decision making (see chart for details).    MDM Rules/Calculators/A&P                           History provided by patient and EMS with additional history obtained from chart review.    69 year old female presenting with shortness of breath.  On arrival she is tachypneic, on 2 L has an oxygen saturation of 93%.  She is speaking in short sentences with accessory muscle use.  Basic labs checked.  She has leukocytosis of 19.1, she is afebrile with no other infectious symptoms, so likely ?stress response as she has not had recent steroids.  Chest x-ray did not show any signs of pneumonia.  BMP overall unremarkable.  Covid test is negative. She already received 125 mg Solu-Medrol and albuterol in route from EMS. Multiple additional interventions tried including hour-long neb, racemic epi, IV magnesium.  Patient is still not back to baseline.  She is tachypneic and has stridor on exam.  Attempted to walk patient to the bathroom and she was unable to walk more than a few steps before shortness of breath significantly worsened, tachypnea worsened.  This case was discussed with Dr. Roderic Palau who has seen the patient and agrees with plan to admit. Spoke with Dr. Cyndia Skeeters with hospitalist service who agrees to assume care of patient and bring into the hospital for further evaluation and management for COPD exacerbation.   Portions of this note were generated with [employer]. Dictation errors may occur despite best attempts at proofreading.  Final Clinical Impression(s) / ED Diagnoses Final diagnoses:  COPD exacerbation [facility])    Rx / DC Orders ED Discharge Orders    None       Cherre Robins, PA-C 08/27/19 1447    Milton Ferguson, MD 08/29/19 [phone]

## 2019-08-27 NOTE — ED Notes (Signed)
COVID specimen walked to lab by this RN

## 2019-08-27 NOTE — H&P (Signed)
History and Physical    Julia Daniels KZL:[id number] DOB: [birth year] DOA: 08/27/2019  PCP: Simona Huh, NP Patient coming from: Home.  Chief Complaint: "I could not breath"  HPI: Julia Daniels is a 69 y.o. female with history of asthma/COPD on as needed O2, anxiety, bipolar disorder, depression and ADD presenting with the above chief complaints.  Patient reports difficulty breathing for the last 3 to 4 days.  This has been worse.  She has dyspnea with minimal exertion.  She reports productive cough with whitish phlegm.  She denies hemoptysis.  She denies fever, chills, edema, orthopnea, PND, nausea, vomiting, abdominal pain, diarrhea or UTI symptoms. She has some burning pain in the middle of her upper back.  Also reports some pain with cough.  She is fully vaccinated against COVID-19.  Denies known exposure to COVID-19 or sick contact.  Reports good compliance with her breathing treatments.  It appears patient son called her pulmonologist this morning out of concern about shortness of breath, wheezing and some mental status change, and was advised to call EMS. Patient received IV Solu-Medrol and nebulizers in route to ED.  Lives with her son.  Denies smoking cigarettes, drinking alcohol or recreational drug use.  In ED, hemodynamically stable.  Saturating low 90s on 4 L.  Glucose 134.  WBC 19.  MCV 101.5.  Otherwise CBC and BMP without significant finding.  CXR without significant finding.  COVID-19 PCR negative.  Hospitalist service consulted for admission for COPD exacerbation.  ROS All review of system negative except for pertinent positives and negatives as history of present illness above.  PMH Past Medical History:  Diagnosis Date  . Anxiety   . Asthma   . COPD (chronic obstructive pulmonary disease) ([place])   . Depression    PSH Past Surgical History:  Procedure Laterality Date  . ABDOMINAL HYSTERECTOMY     partial    Fam HX Family History  Problem Relation  Age of Onset  . Asthma Mother   . Lung cancer Father   . COPD Father     Social Hx  reports that she quit smoking about 2 years ago. Her smoking use included cigarettes. She has a 40.00 pack-year smoking history. She has never used smokeless tobacco. She reports that she does not drink alcohol and does not use drugs.  Allergy Allergies  Allergen Reactions  . Sulfa Antibiotics Other (See Comments)    Mouth gets raw   Home Meds Prior to Admission medications   Medication Sig Start Date End Date Taking? Authorizing Provider  albuterol (PROAIR HFA) 108 (90 Base) MCG/ACT inhaler Inhale 2 puffs into the lungs every 6 (six) hours as needed for wheezing or shortness of breath. 07/25/19   Tanda Rockers, MD  albuterol (PROVENTIL) (2.5 MG/3ML) 0.083% nebulizer solution Take 3 mLs (2.5 mg total) by nebulization every 6 (six) hours as needed for wheezing or shortness of breath. 08/18/19   Tanda Rockers, MD  ALPRAZolam Duanne Moron) 0.25 MG tablet Take 0.25 mg by mouth daily as needed for anxiety.  07/21/17   [provider]  bismuth subsalicylate (PEPTO BISMOL) 262 MG chewable tablet Chew 262-524 mg by mouth daily as needed for diarrhea or loose stools.    [provider]  budesonide-formoterol (SYMBICORT) 160-4.5 MCG/ACT inhaler INHALE 2 PUFFS INTO LUNGS TWICE A DAY 08/26/19   Tanda Rockers, MD  EPINEPHrine 0.3 mg/0.3 mL IJ SOAJ injection Inject 0.3 mLs (0.3 mg total) into the muscle as needed for anaphylaxis. 07/15/19  Martyn Ehrich, NP  famotidine (PEPCID) 20 MG tablet One at bedtime 07/25/19   Tanda Rockers, MD  FASENRA 30 MG/ML SOSY INJECT 1 SYRINGE UNDER THE SKIN EVERY 8 WEEKS. 01/20/19   Tanda Rockers, MD  lamoTRIgine (LAMICTAL) 200 MG tablet Take 200 mg by mouth at bedtime.     [provider]  loratadine (CLARITIN) 10 MG tablet Take 10 mg by mouth daily.    [provider]  methylphenidate 54 MG PO CR tablet Take 72 mg by mouth every morning.    [provider]  montelukast (SINGULAIR) 10 MG tablet Take 1 tablet (10 mg total) by mouth at bedtime. 04/17/19   Tanda Rockers, MD  pantoprazole (PROTONIX) 40 MG tablet Take 1 tablet (40 mg total) by mouth daily. Take 30-60 min before first meal of the day 07/25/19   Tanda Rockers, MD  predniSONE (DELTASONE) 10 MG tablet TAKE 2 TAB EVERY MORNING UPON WAKING UNTIL BETTER, THEN TAKE 1 TAB EVERY DAY FOR 1 WEEK THEN STOP 03/06/19   Tanda Rockers, MD  predniSONE (DELTASONE) 10 MG tablet Take 4 for three days 3 for three days 2 for three days 1 for three days and stop 07/25/19   Tanda Rockers, MD  QUEtiapine (SEROQUEL) 50 MG tablet Take 100 mg by mouth at bedtime. 02/28/18   [provider]  risperiDONE (RISPERDAL) 1 MG tablet Take 3 mg by mouth at bedtime.  07/06/17   [provider]  Spacer/Aero-Holding Chambers (AEROCHAMBER MV) inhaler Use as instructed 03/14/18   Lauraine Rinne, NP  Tiotropium Bromide Monohydrate (SPIRIVA RESPIMAT) 2.5 MCG/ACT AERS Inhale 2 puffs into the lungs daily. 08/26/19   Tanda Rockers, MD  venlafaxine XR (EFFEXOR-XR) 150 MG 24 hr capsule Take 150 mg by mouth at bedtime.     [provider]  Vitamin D, Ergocalciferol, (DRISDOL) 50000 units CAPS capsule Take 50,000 Units by mouth every Saturday.  06/18/17   [provider]    Physical Exam: Vitals:   08/27/19 1309 08/27/19 1311 08/27/19 1330 08/27/19 1400  BP: 134/89  117/66 109/83  Pulse: (!) 114  98 91  Resp: (!) 21  18 18   Temp:      TempSrc:      SpO2: 97% 92% 96% 92%  Weight:      Height:        GENERAL: No acute distress.  Appears well.  HEENT: MMM.  Vision and hearing grossly intact.  NECK: Supple.  No apparent JVD.  RESP: 89 to 90% on RA.  No IWOB.  Fair aeration with end expiratory wheeze. CVS:  RRR. Heart sounds normal.  ABD/GI/GU: Bowel sounds present. Soft. Non tender.  MSK/EXT:  Moves extremities. No apparent deformity or edema.  SKIN: no apparent skin lesion or  wound NEURO: Awake, alert and oriented x4 except to date.  No gross deficit.  PSYCH: Calm. Normal affect.   Personally Reviewed Radiological Exams DG Chest Port 1 View  Result Date: 08/27/2019 CLINICAL DATA:  69 year old female with shortness of breath for several days. COPD. EXAM: PORTABLE CHEST 1 VIEW COMPARISON:  Chest radiograph 06/27/2019 and earlier. FINDINGS: Portable AP semi upright view at 0905 hours. Mildly lower lung volumes. Stable mediastinal contours. Stable trachea. Allowing for portable technique basilar predominant increased pulmonary interstitial markings appear not significantly changed. Mild linear atelectasis suspected in the periphery of the left mid lung. No pneumothorax or pleural effusion. Paucity of bowel gas in the upper abdomen.  No acute osseous abnormality identified. IMPRESSION: Mild atelectasis.  No other acute cardiopulmonary abnormality. Electronically Signed   By: Genevie Ann M.D.   On: 08/27/2019 09:51     Personally Reviewed Labs: CBC: Recent Labs  Lab 08/27/19 0846  WBC 19.1*  HGB 12.9  HCT 40.1  MCV 101.5*  PLT 973   Basic Metabolic Panel: Recent Labs  Lab 08/27/19 0846  NA 136  K 4.5  CL 99  CO2 27  GLUCOSE 134*  BUN 18  CREATININE 0.75  CALCIUM 10.1   GFR: Estimated Creatinine Clearance: 73.2 mL/min (by C-G formula based on SCr of 0.75 mg/dL). Liver Function Tests: No results for input(s): AST, ALT, ALKPHOS, BILITOT, PROT, ALBUMIN in the last 168 hours. No results for input(s): LIPASE, AMYLASE in the last 168 hours. No results for input(s): AMMONIA in the last 168 hours. Coagulation Profile: No results for input(s): INR, PROTIME in the last 168 hours. Cardiac Enzymes: No results for input(s): CKTOTAL, CKMB, CKMBINDEX, TROPONINI in the last 168 hours. BNP (last 3 results) No results for input(s): PROBNP in the last 8760 hours. HbA1C: No results for input(s): HGBA1C in the last 72 hours. CBG: No results for input(s): GLUCAP in the  last 168 hours. Lipid Profile: No results for input(s): CHOL, HDL, LDLCALC, TRIG, CHOLHDL, LDLDIRECT in the last 72 hours. Thyroid Function Tests: No results for input(s): TSH, T4TOTAL, FREET4, T3FREE, THYROIDAB in the last 72 hours. Anemia Panel: No results for input(s): VITAMINB12, FOLATE, FERRITIN, TIBC, IRON, RETICCTPCT in the last 72 hours. Urine analysis:    Component Value Date/Time   BILIRUBINUR n 09/23/2012 1111   PROTEINUR n 09/23/2012 1111   UROBILINOGEN 0.2 09/23/2012 1111   NITRITE n 09/23/2012 1111   LEUKOCYTESUR Negative 09/23/2012 1111    Sepsis Labs:  None  Personally Reviewed EKG:  None obtained in ED.  Assessment/Plan COPD with acute exacerbation-presents with cardinal symptoms including SOB and productive cough.  CXR without acute finding. -COPD pathway with systemic steroid, antibiotics and nebulizers -Wean oxygen as able.  Minimal oxygen to keep saturation above 88%. -Incentive spirometry and ambulatory saturations  Hyperglycemia without diagnosis of diabetes -Check hemoglobin A1c  Altered mental status: some concern about mental status change per son. Recently started on Baclofen. She is also on other sedating medication that could contribute. She is now awake and oriented x4 except date. -Hold baclofen -Minimize sedating medication.  Leukocytosis: Likely demargination from steroid -Monitor  Mood disorder (anxiety/depression/bipolar disorder/ADD): Somewhat anxious -Resume home meds after med rec  Debility-patient son concerned about patient's ability to care for herself at home -PT/OT eval  Obesity: BMI 34.72. -Encourage lifestyle change   DVT prophylaxis: Subcu Lovenox  Code Status: Full code Family Communication: Updated patient's son over the phone.  Disposition Plan: MedSurg Consults called: None Admission status: Observation   Mercy Riding MD [facility]  If 7PM-7AM, please contact  night-coverage [email or web address]  08/27/2019, 3:12 PM

## 2019-08-27 NOTE — Telephone Encounter (Signed)
Received page from answering service regarding this patient via son Mckaela Howley.  Returned call and spoke with [name].  Had a couple days of increased shortness of breath and a lot of wheezing.  Unsure what oxygen saturation is.  In addition, she seems not be acting herself, saying things that do not make sense, has been incontinent on 2 occasions which is highly unusual.  Was up all night on the nebulizer and rescue inhaler without improvement.  Notes that a day or 2 ago they lost power at the house so she went to a hotel so that she could continue to use nebulizers requiring power.  Despite this intervention she continues to worsen.  Per son's report this is "the worst she is ever been."  Suspect exacerbation of COPD/asthma overlap.  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  Advised he call 911 and have EMS transport patient to ED.  In the past, she has had a long wait times.  Did advise him that with the high volume of patients in the hospital with resurgence of delta variant COVID-19 but the wait could be long but that the ED was well equipped to triage and treat her mother.  Highest concern for acute exacerbation of COPD but do wonder about other infectious or toxic metabolic insults causing some encephalopathy in addition to the wheezing.  [name] expressed understanding and indicated he would call 911 once our conversation was over.  All questions answered.

## 2019-08-27 NOTE — ED Notes (Signed)
Respiratory called and notified that covid test is negative

## 2019-08-27 NOTE — Telephone Encounter (Signed)
Called [facility] to check status of Fasenra prescription, they were able to get a processed claim. Shipment scheduled to deliver to office tomorrow- 08/28/19.

## 2019-08-27 NOTE — ED Triage Notes (Signed)
Pt BIBA from home, sob since Saturday. Hx of COPD, panic attacks/anxiety and asthma.   BP 127/76 P 114 90% on RA, 98% after treatment RR 24 CBG 132   20 L forearm  125 mg solumedrol 1 atrovent 10 albuterol

## 2019-08-28 DIAGNOSIS — Z7951 Long term (current) use of inhaled steroids: Secondary | ICD-10-CM | POA: Diagnosis not present

## 2019-08-28 DIAGNOSIS — Z20822 Contact with and (suspected) exposure to covid-19: Secondary | ICD-10-CM | POA: Diagnosis present

## 2019-08-28 DIAGNOSIS — J441 Chronic obstructive pulmonary disease with (acute) exacerbation: Secondary | ICD-10-CM | POA: Diagnosis present

## 2019-08-28 DIAGNOSIS — F319 Bipolar disorder, unspecified: Secondary | ICD-10-CM | POA: Diagnosis present

## 2019-08-28 DIAGNOSIS — Z87891 Personal history of nicotine dependence: Secondary | ICD-10-CM | POA: Diagnosis not present

## 2019-08-28 DIAGNOSIS — D72829 Elevated white blood cell count, unspecified: Secondary | ICD-10-CM | POA: Diagnosis present

## 2019-08-28 DIAGNOSIS — F419 Anxiety disorder, unspecified: Secondary | ICD-10-CM | POA: Diagnosis present

## 2019-08-28 DIAGNOSIS — R739 Hyperglycemia, unspecified: Secondary | ICD-10-CM | POA: Diagnosis present

## 2019-08-28 DIAGNOSIS — F39 Unspecified mood [affective] disorder: Secondary | ICD-10-CM | POA: Diagnosis not present

## 2019-08-28 DIAGNOSIS — R7303 Prediabetes: Secondary | ICD-10-CM | POA: Diagnosis present

## 2019-08-28 DIAGNOSIS — K219 Gastro-esophageal reflux disease without esophagitis: Secondary | ICD-10-CM | POA: Diagnosis present

## 2019-08-28 DIAGNOSIS — Z90710 Acquired absence of both cervix and uterus: Secondary | ICD-10-CM | POA: Diagnosis not present

## 2019-08-28 DIAGNOSIS — Z79899 Other long term (current) drug therapy: Secondary | ICD-10-CM | POA: Diagnosis not present

## 2019-08-28 DIAGNOSIS — E669 Obesity, unspecified: Secondary | ICD-10-CM | POA: Diagnosis present

## 2019-08-28 DIAGNOSIS — J9621 Acute and chronic respiratory failure with hypoxia: Secondary | ICD-10-CM | POA: Diagnosis present

## 2019-08-28 DIAGNOSIS — R030 Elevated blood-pressure reading, without diagnosis of hypertension: Secondary | ICD-10-CM | POA: Diagnosis present

## 2019-08-28 DIAGNOSIS — Z6834 Body mass index (BMI) 34.0-34.9, adult: Secondary | ICD-10-CM | POA: Diagnosis not present

## 2019-08-28 DIAGNOSIS — R5381 Other malaise: Secondary | ICD-10-CM | POA: Diagnosis present

## 2019-08-28 LAB — HIV ANTIBODY (ROUTINE TESTING W REFLEX): HIV Screen 4th Generation wRfx: NONREACTIVE

## 2019-08-28 LAB — HEMOGLOBIN A1C
Hgb A1c MFr Bld: 5.7 % — ABNORMAL HIGH (ref 4.8–5.6)
Mean Plasma Glucose: 116.89 mg/dL

## 2019-08-28 MED ORDER — IPRATROPIUM-ALBUTEROL 0.5-2.5 (3) MG/3ML IN SOLN
3.0000 mL | Freq: Three times a day (TID) | RESPIRATORY_TRACT | Status: DC
  Administered 2019-08-29 – 2019-09-02 (×13): 3 mL via RESPIRATORY_TRACT
  Filled 2019-08-28 (×14): qty 3

## 2019-08-28 MED ORDER — ALBUTEROL SULFATE (2.5 MG/3ML) 0.083% IN NEBU
2.5000 mg | INHALATION_SOLUTION | RESPIRATORY_TRACT | Status: DC | PRN
  Administered 2019-08-30: 2.5 mg via RESPIRATORY_TRACT
  Filled 2019-08-28 (×2): qty 3

## 2019-08-28 NOTE — Progress Notes (Signed)
PT Cancellation Note  Patient Details Name: Julia Daniels MRN: [id number] DOB: 1951-01-04   Cancelled Treatment:     PT order received and eval attempted x 2 - deferred in am 2* pt fatigue after just getting back to bed and deferred in pm 2* increased back pain.  Will follow in am.  Debe Coder PT [place] Pager [phone] Office [phone]    [facility] 08/28/2019, 2:11 PM

## 2019-08-28 NOTE — Progress Notes (Signed)
PROGRESS NOTE  Julia Daniels BDZ:[id number] DOB: 11-Feb-1950 DOA: 08/27/2019 PCP: Simona Huh, NP  HPI/Recap of past 24 hours: HPI from Dr Margot Chimes is a 69 y.o. female with history of asthma/COPD on as needed O2, anxiety, bipolar disorder, depression and ADD presenting with the above chief complaints. Patient reports worsening SOB for the last 3 to 4 days, dyspnea with minimal exertion, with productive cough with whitish phlegm. She is fully vaccinated against COVID-19.  Denies known exposure to COVID-19 or sick contact.  Reports good compliance with her breathing treatments. Son called her pulmonologist out of concern about shortness of breath, wheezing and some mental status change, and was advised to call EMS. Patient received IV Solu-Medrol and nebulizers in route to ED. Lives with her son.  Denies smoking cigarettes, drinking alcohol or recreational drug use. In ED, hemodynamically stable.  Saturating low 90s on 4 L.  Glucose 134.  WBC 19.  MCV 101.5.  Otherwise CBC and BMP without significant finding.  CXR without significant finding.  COVID-19 PCR negative.  Hospitalist service consulted for admission for COPD exacerbation.    Patient still noted to be wheezing, still short of breath, denies it worsening.  Patient denies any worsening cough, denies any chest pain, abdominal pain, nausea/vomiting, fever/chills.     Assessment/Plan: Active Problems:   COPD exacerbation (HCC)   Acute COPD exacerbation Acute on chronic hypoxic respiratory failure Currently requiring about 4 L of O2, saturating in the low 90s Afebrile, with leukocytosis (steroids) Chest x-ray without any acute finding Continue steroids, doxycycline, nebulizers and inhalers Incentive spirometry, supplemental oxygen, wean as able PT/OT Monitor closely  Prediabetes A1c 5.7 Daily CBG Outpatient follow-up  Anxiety/depression/bipolar/ADD Continue Effexor, quetiapine, methylphenidate,  Lamictal, Xanax as needed  GERD Continue PPI  Obesity Lifestyle modification advised       Malnutrition Type:      Malnutrition Characteristics:      Nutrition Interventions:       Estimated body mass index is 34.72 kg/m as calculated from the following:   Height as of this encounter: 5\' 3"  (1.6 m).   Weight as of this encounter: 88.9 kg.     Code Status: Full  Family Communication: Discussed with patient  Disposition Plan: Status is: Observation  The patient will require care spanning > 2 midnights and should be moved to inpatient because: Inpatient level of care appropriate due to severity of illness  Dispo: The patient is from: Home              Anticipated d/c is to: Home              Anticipated d/c date is: 2 days              Patient currently is not medically stable to d/c.    Consultants:  None  Procedures:  None  Antimicrobials:  Doxycycline  DVT prophylaxis: Lovenox   Objective: Vitals:   08/28/19 0222 08/28/19 0537 08/28/19 0748 08/28/19 0933  BP:  138/88    Pulse:  98    Resp:  18    Temp:  99.2 F (37.3 C)    TempSrc:  Oral    SpO2: 93% 92% 93% 91%  Weight:      Height:        Intake/Output Summary (Last 24 hours) at 08/28/2019 1205 Last data filed at 08/28/2019 1000 Gross per 24 hour  Intake 1249.71 ml  Output 700 ml  Net 549.71 ml   Danley Danker  Weights   08/27/19 0852  Weight: 88.9 kg    Exam:  General: NAD   Cardiovascular: S1, S2 present  Respiratory:  Bilateral wheezing noted with diminished breath sounds  Abdomen: Soft, nontender, nondistended, bowel sounds present  Musculoskeletal: No bilateral pedal edema noted  Skin: Normal  Psychiatry: Normal mood   Data Reviewed: CBC: Recent Labs  Lab 08/27/19 0846  WBC 19.1*  HGB 12.9  HCT 40.1  MCV 101.5*  PLT 229   Basic Metabolic Panel: Recent Labs  Lab 08/27/19 0846  NA 136  K 4.5  CL 99  CO2 27  GLUCOSE 134*  BUN 18  CREATININE 0.75   CALCIUM 10.1   GFR: Estimated Creatinine Clearance: 73.2 mL/min (by C-G formula based on SCr of 0.75 mg/dL). Liver Function Tests: No results for input(s): AST, ALT, ALKPHOS, BILITOT, PROT, ALBUMIN in the last 168 hours. No results for input(s): LIPASE, AMYLASE in the last 168 hours. No results for input(s): AMMONIA in the last 168 hours. Coagulation Profile: No results for input(s): INR, PROTIME in the last 168 hours. Cardiac Enzymes: No results for input(s): CKTOTAL, CKMB, CKMBINDEX, TROPONINI in the last 168 hours. BNP (last 3 results) No results for input(s): PROBNP in the last 8760 hours. HbA1C: Recent Labs    08/28/19 0506  HGBA1C 5.7*   CBG: No results for input(s): GLUCAP in the last 168 hours. Lipid Profile: No results for input(s): CHOL, HDL, LDLCALC, TRIG, CHOLHDL, LDLDIRECT in the last 72 hours. Thyroid Function Tests: No results for input(s): TSH, T4TOTAL, FREET4, T3FREE, THYROIDAB in the last 72 hours. Anemia Panel: No results for input(s): VITAMINB12, FOLATE, FERRITIN, TIBC, IRON, RETICCTPCT in the last 72 hours. Urine analysis:    Component Value Date/Time   BILIRUBINUR n 09/23/2012 1111   PROTEINUR n 09/23/2012 1111   UROBILINOGEN 0.2 09/23/2012 1111   NITRITE n 09/23/2012 1111   LEUKOCYTESUR Negative 09/23/2012 1111   Sepsis Labs: @LABRCNTIP (procalcitonin:4,lacticidven:4)  ) Recent Results (from the past 240 hour(s))  SARS Coronavirus 2 by RT PCR (hospital order, performed in [facility] hospital lab) Nasopharyngeal Nasopharyngeal Swab     Status: None   Collection Time: 08/27/19  8:48 AM   Specimen: Nasopharyngeal Swab  Result Value Ref Range Status   SARS Coronavirus 2 NEGATIVE NEGATIVE Final    Comment: (NOTE) SARS-CoV-2 target nucleic acids are NOT DETECTED.  The SARS-CoV-2 RNA is generally detectable in upper and lower respiratory specimens during the acute phase of infection. The lowest concentration of SARS-CoV-2 viral copies this assay  can detect is 250 copies / mL. A negative result does not preclude SARS-CoV-2 infection and should not be used as the sole basis for treatment or other patient management decisions.  A negative result may occur with improper specimen collection / handling, submission of specimen other than nasopharyngeal swab, presence of viral mutation(s) within the areas targeted by this assay, and inadequate number of viral copies (<250 copies / mL). A negative result must be combined with clinical observations, patient history, and epidemiological information.  Fact Sheet for Patients:   StrictlyIdeas.no  Fact Sheet for Healthcare Providers: BankingDealers.co.za  This test is not yet approved or  cleared by the [place] FDA and has been authorized for detection and/or diagnosis of SARS-CoV-2 by FDA under an Emergency Use Authorization (EUA).  This EUA will remain in effect (meaning this test can be used) for the duration of the COVID-19 declaration under Section 564(b)(1) of the Act, 21 U.S.C. section 360bbb-3(b)(1), unless the authorization is  terminated or revoked sooner.  Performed at [facility], [place] [address], [place], [place] [address]       Studies: No results found.  Scheduled Meds: . acetaminophen  1,000 mg Oral Q8H  . budesonide (PULMICORT) nebulizer solution  0.5 mg Nebulization BID  . doxycycline  100 mg Oral Q12H  . enoxaparin (LOVENOX) injection  40 mg Subcutaneous Q24H  . ipratropium-albuterol  3 mL Nebulization Q6H  . lamoTRIgine  200 mg Oral QHS  . loratadine  10 mg Oral Daily  . methylphenidate  18 mg Oral Daily  . methylPREDNISolone (SOLU-MEDROL) injection  60 mg Intravenous Q6H   Followed by  . [START ON 08/29/2019] predniSONE  40 mg Oral Q breakfast  . montelukast  10 mg Oral QHS  . pantoprazole  40 mg Oral Q0600  . QUEtiapine  100 mg Oral QHS  . venlafaxine XR  150 mg Oral QHS     Continuous Infusions:   LOS: 0 days     Alma Friendly, MD [facility]  If 7PM-7AM, please contact night-coverage [email or web address] 08/28/2019, 12:05 PM

## 2019-08-28 NOTE — Evaluation (Signed)
Occupational Therapy Evaluation Patient Details Name: Julia Daniels MRN: [id number] DOB: 1950/09/05 Today's Date: 08/28/2019    History of Present Illness Julia Daniels is a 69 y.o. female with history of asthma/COPD on as needed O2, anxiety, bipolar disorder, depression and ADD admitted for COPD exacerbation.   Clinical Impression   Julia Daniels is a 69 year old woman admitted to hospital with COPD exacerbation and currently on 4L [place]. On evaluation patient presents with functional ROM and strength of upper extremities though upper extremity use limited by complaints of upper back pain. Patient required min assist for supine to sit, min guard for standing and transfer to recliner and set up assist for all ADLs due to decreased cardiopulmonary endurance. Patient's o2 sat maintained between 89-91% on 4 liters with minimal activity. Patient educated and instructed on pursed lipped breathing technique and the beginnings of energy conversation principles. Patient will benefit from skilled OT services to improve deficits, learn compensatory strategies for ADLs and IADLs and learn breathing techniques to improve tolerance for activity.     Follow Up Recommendations  No OT follow up    Equipment Recommendations  Tub/shower seat    Recommendations for Other Services       Precautions / Restrictions Precautions Precautions: None Precaution Comments: Thoracic Back pain, monitor o2 sats Restrictions Weight Bearing Restrictions: No      Mobility Bed Mobility Overal bed mobility: Needs Assistance Bed Mobility: Supine to Sit     Supine to sit: Min assist;HOB elevated     General bed mobility comments: min assist to transfer into sitting. limited by upper extremity movemetn causing upper back pain.  Transfers Overall transfer level: Needs assistance Equipment used: None Transfers: Sit to/from [facility] Sit to Stand: Min guard Stand pivot transfers:  Min guard       General transfer comment: Activity limited due to transfer to recliner secondary to o2 sats.    Balance Overall balance assessment: No apparent balance deficits (not formally assessed)                                         ADL either performed or assessed with clinical judgement   ADL Overall ADL's : Needs assistance/impaired Eating/Feeding: Independent   Grooming: Set up;Sitting   Upper Body Bathing: Set up;Sitting   Lower Body Bathing: Set up;Sit to/from stand   Upper Body Dressing : Set up;Sitting   Lower Body Dressing: Set up;Sit to/from stand   Toilet Transfer: BSC;Stand-pivot   Toileting- Water quality scientist and Hygiene: Supervision/safety;Sit to/from stand       Functional mobility during ADLs: Min guard       Vision Patient Visual Report: No change from baseline Vision Assessment?: No apparent visual deficits     Perception     Praxis      Pertinent Vitals/Pain Pain Assessment: Faces Faces Pain Scale: Hurts even more Pain Location: Upper back - with movement Pain Descriptors / Indicators: Grimacing;Guarding;Sharp Pain Intervention(s): Limited activity within patient's tolerance;Monitored during session;Heat applied     Hand Dominance Right   Extremity/Trunk Assessment Upper Extremity Assessment Upper Extremity Assessment: Overall WFL for tasks assessed   Lower Extremity Assessment Lower Extremity Assessment: Defer to PT evaluation   Cervical / Trunk Assessment Cervical / Trunk Assessment: Normal   Communication Communication Communication: No difficulties   Cognition Arousal/Alertness: Awake/alert Behavior During Therapy: WFL for tasks assessed/performed Overall  Cognitive Status: Within Functional Limits for tasks assessed                                     General Comments       Exercises     Shoulder Instructions      Home Living Family/patient expects to be discharged to::  Private residence Living Arrangements: Children (son) Available Help at Discharge: Family;Available PRN/intermittently Type of Home: House Home Access: Stairs to enter [employer] of Steps: 4 Entrance Stairs-Rails: Right Home Layout: One level     Bathroom Shower/Tub: Teacher, early years/pre: Standard     Home Equipment: None          Prior Functioning/Environment Level of Independence: Independent        Comments: 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 Reports fatiguing at night and with increased activity. Reports using O2 PRN at night.        OT Problem List: Decreased activity tolerance;Cardiopulmonary status limiting activity;Pain      OT Treatment/Interventions: Self-care/ADL training;Therapeutic exercise;Energy conservation;DME and/or AE instruction;Therapeutic activities;Patient/family education    OT Goals(Current goals can be found in the care plan section) Acute Rehab OT Goals Patient Stated Goal: To improve endurance/breathing OT Goal Formulation: With patient Time For Goal Achievement: 09/11/19 Potential to Achieve Goals: Good  OT Frequency: Min 2X/week   Barriers to D/C: Decreased caregiver support  Patient's son works during the day.       Co-evaluation              AM-PAC OT "6 Clicks" Daily Activity     Outcome Measure Help from another person eating meals?: None Help from another person taking care of personal grooming?: A Little Help from another person toileting, which includes using toliet, bedpan, or urinal?: A Little Help from another person bathing (including washing, rinsing, drying)?: A Little Help from another person to put on and taking off regular upper body clothing?: A Little Help from another person to put on and taking off regular lower body clothing?: A Little 6 Click Score: 19   End of Session Equipment Utilized During Treatment: Oxygen Nurse Communication:  (o2 sats, back pain)  Activity  Tolerance: Patient tolerated treatment well Patient left: in chair;with call bell/phone within reach;with chair alarm set  OT Visit Diagnosis: Pain Pain - part of body:  (upper back)                Time: 9357-0177 OT Time Calculation (min): 31 min Charges:  OT General Charges $OT Visit: 1 Visit OT Evaluation $OT Eval Moderate Complexity: 1 Mod OT Treatments $Therapeutic Activity: 8-22 mins  Alyze Lauf, OTR/L [place]  Office [phone] Pager: [place] 08/28/2019, 11:26 AM

## 2019-08-28 NOTE — Progress Notes (Signed)
Nutrition Brief Note  RD consulted via COPD protocol. Pt with stable weight. Did not report poor appetite.   Wt Readings from Last 15 Encounters:  08/27/19 88.9 kg  07/25/19 87.7 kg  06/27/19 83.9 kg  12/27/18 87.5 kg  10/03/18 90.3 kg  07/03/18 93.4 kg  05/24/18 92.5 kg  03/27/18 91.2 kg  03/14/18 91.4 kg  03/07/18 90 kg  02/11/18 89.6 kg  01/31/18 87.3 kg  01/18/18 90.6 kg  12/20/17 86.8 kg  12/04/17 86.8 kg    Body mass index is 34.72 kg/m. Patient meets criteria for obesity based on current BMI.   Current diet order is heart healthy. Labs and medications reviewed.   No nutrition interventions warranted at this time. If nutrition issues arise, please consult RD.   Julia Bibles, MS, RD, LDN Inpatient Clinical Dietitian Contact information available via Amion

## 2019-08-29 ENCOUNTER — Telehealth: Payer: Self-pay | Admitting: Internal Medicine

## 2019-08-29 LAB — CBC WITH DIFFERENTIAL/PLATELET
Abs Immature Granulocytes: 0.24 10*3/uL — ABNORMAL HIGH (ref 0.00–0.07)
Basophils Absolute: 0.1 10*3/uL (ref 0.0–0.1)
Basophils Relative: 0 %
Eosinophils Absolute: 0 10*3/uL (ref 0.0–0.5)
Eosinophils Relative: 0 %
HCT: 41.1 % (ref 36.0–46.0)
Hemoglobin: 13.3 g/dL (ref 12.0–15.0)
Immature Granulocytes: 1 %
Lymphocytes Relative: 12 %
Lymphs Abs: 1.9 10*3/uL (ref 0.7–4.0)
MCH: 32.8 pg (ref 26.0–34.0)
MCHC: 32.4 g/dL (ref 30.0–36.0)
MCV: 101.2 fL — ABNORMAL HIGH (ref 80.0–100.0)
Monocytes Absolute: 1.4 10*3/uL — ABNORMAL HIGH (ref 0.1–1.0)
Monocytes Relative: 8 %
Neutro Abs: 13.1 10*3/uL — ABNORMAL HIGH (ref 1.7–7.7)
Neutrophils Relative %: 79 %
Platelets: 293 10*3/uL (ref 150–400)
RBC: 4.06 MIL/uL (ref 3.87–5.11)
RDW: 14.3 % (ref 11.5–15.5)
WBC: 16.7 10*3/uL — ABNORMAL HIGH (ref 4.0–10.5)
nRBC: 0 % (ref 0.0–0.2)

## 2019-08-29 LAB — BASIC METABOLIC PANEL
Anion gap: 9 (ref 5–15)
BUN: 24 mg/dL — ABNORMAL HIGH (ref 8–23)
CO2: 27 mmol/L (ref 22–32)
Calcium: 9.9 mg/dL (ref 8.9–10.3)
Chloride: 100 mmol/L (ref 98–111)
Creatinine, Ser: 0.74 mg/dL (ref 0.44–1.00)
GFR calc Af Amer: >60 mL/min (ref >60)
GFR calc non Af Amer: >60 mL/min (ref >60)
Glucose, Bld: 104 mg/dL — ABNORMAL HIGH (ref 70–99)
Potassium: 4.6 mmol/L (ref 3.5–5.1)
Sodium: 136 mmol/L (ref 135–145)

## 2019-08-29 MED ORDER — SPIRIVA RESPIMAT 2.5 MCG/ACT IN AERS: 2.0000 | INHALATION_SPRAY | Freq: Every day | RESPIRATORY_TRACT | 2 refills | Status: DC

## 2019-08-29 MED ORDER — HYDRALAZINE HCL 20 MG/ML IJ SOLN
10.0000 mg | Freq: Three times a day (TID) | INTRAMUSCULAR | Status: DC | PRN
  Administered 2019-08-29 – 2019-09-01 (×3): 10 mg via INTRAVENOUS
  Filled 2019-08-29 (×5): qty 1

## 2019-08-29 MED ORDER — ALPRAZOLAM 0.25 MG PO TABS
0.2500 mg | ORAL_TABLET | Freq: Once | ORAL | Status: AC
  Administered 2019-08-29: 0.25 mg via ORAL
  Filled 2019-08-29: qty 1

## 2019-08-29 MED ORDER — METHYLPREDNISOLONE SODIUM SUCC 125 MG IJ SOLR
60.0000 mg | Freq: Two times a day (BID) | INTRAMUSCULAR | Status: DC
  Administered 2019-08-29 – 2019-09-01 (×6): 60 mg via INTRAVENOUS
  Filled 2019-08-29 (×6): qty 2

## 2019-08-29 MED ORDER — AMLODIPINE BESYLATE 5 MG PO TABS
2.5000 mg | ORAL_TABLET | Freq: Every day | ORAL | Status: DC
  Administered 2019-08-29: 2.5 mg via ORAL
  Filled 2019-08-29: qty 1

## 2019-08-29 NOTE — Plan of Care (Signed)

## 2019-08-29 NOTE — Telephone Encounter (Signed)
ATC, left VM.  Left message that I would send script for Spiriva to [facility], the one on 8/17 was sent to [facility] drug store in [place].  Advised to call back to the office if she needed anything further.

## 2019-08-29 NOTE — Progress Notes (Signed)
PROGRESS NOTE  Julia Daniels DGL:[id number] DOB: 04-Feb-1950 DOA: 08/27/2019 PCP: Simona Huh, NP  HPI/Recap of past 24 hours: HPI from Dr Margot Chimes is a 69 y.o. female with history of asthma/COPD on as needed O2, anxiety, bipolar disorder, depression and ADD presenting with the above chief complaints. Patient reports worsening SOB for the last 3 to 4 days, dyspnea with minimal exertion, with productive cough with whitish phlegm. She is fully vaccinated against COVID-19.  Denies known exposure to COVID-19 or sick contact.  Reports good compliance with her breathing treatments. Son called her pulmonologist out of concern about shortness of breath, wheezing and some mental status change, and was advised to call EMS. Patient received IV Solu-Medrol and nebulizers in route to ED. Lives with her son.  Denies smoking cigarettes, drinking alcohol or recreational drug use. In ED, hemodynamically stable.  Saturating low 90s on 4 L.  Glucose 134.  WBC 19.  MCV 101.5.  Otherwise CBC and BMP without significant finding.  CXR without significant finding.  COVID-19 PCR negative.  Hospitalist service consulted for admission for COPD exacerbation.    Patient still noted to be wheezing significantly bilaterally, feels congested, unable to cough up anything.  Denies chest pain, but reports back pain when she coughs.  Intermittently anxious.     Assessment/Plan: Active Problems:   COPD with acute exacerbation (HCC)   COPD exacerbation (HCC)   Acute COPD exacerbation Acute on chronic hypoxic respiratory failure Currently requiring about 3-4 L of O2, saturating in the low 90s Afebrile, with leukocytosis (steroids) Chest x-ray without any acute finding Continue steroids, doxycycline, nebulizers and inhalers Incentive spirometry, supplemental oxygen, wean as able PT/OT Monitor closely  Prediabetes A1c 5.7 Daily CBG Outpatient follow-up  Anxiety/depression/bipolar/ADD Continue  Effexor, quetiapine, methylphenidate, Lamictal, Xanax as needed  GERD Continue PPI  Obesity Lifestyle modification advised       Malnutrition Type:      Malnutrition Characteristics:      Nutrition Interventions:       Estimated body mass index is 34.72 kg/m as calculated from the following:   Height as of this encounter: 5\' 3"  (1.6 m).   Weight as of this encounter: 88.9 kg.     Code Status: Full  Family Communication: Discussed with patient  Disposition Plan: Status is: Inpatient  The patient will require care spanning > 2 midnights and should be moved to inpatient because: Inpatient level of care appropriate due to severity of illness  Dispo: The patient is from: Home              Anticipated d/c is to: Home              Anticipated d/c date is: 2 days              Patient currently is not medically stable to d/c.    Consultants:  None  Procedures:  None  Antimicrobials:  Doxycycline  DVT prophylaxis: Lovenox   Objective: Vitals:   08/29/19 0603 08/29/19 0852 08/29/19 1255 08/29/19 1306  BP: (!) 138/111   (!) 144/104  Pulse: 87   89  Resp: 18   18  Temp: 98.9 F (37.2 C)   98.4 F (36.9 C)  TempSrc: Oral   Oral  SpO2: 99% 95% 93% 92%  Weight:      Height:        Intake/Output Summary (Last 24 hours) at 08/29/2019 1556 Last data filed at 08/29/2019 1320 Gross per 24 hour  Intake 1077 ml  Output 2150 ml  Net -1073 ml   Filed Weights   08/27/19 0852  Weight: 88.9 kg    Exam:  General: NAD   Cardiovascular: S1, S2 present  Respiratory:  Bilateral wheezing noted with diminished breath sounds  Abdomen: Soft, nontender, nondistended, bowel sounds present  Musculoskeletal: No bilateral pedal edema noted  Skin: Normal  Psychiatry: Normal mood   Data Reviewed: CBC: Recent Labs  Lab 08/27/19 0846 08/29/19 0446  WBC 19.1* 16.7*  NEUTROABS  --  13.1*  HGB 12.9 13.3  HCT 40.1 41.1  MCV 101.5* 101.2*  PLT 290 532    Basic Metabolic Panel: Recent Labs  Lab 08/27/19 0846 08/29/19 0446  NA 136 136  K 4.5 4.6  CL 99 100  CO2 27 27  GLUCOSE 134* 104*  BUN 18 24*  CREATININE 0.75 0.74  CALCIUM 10.1 9.9   GFR: Estimated Creatinine Clearance: 73.2 mL/min (by C-G formula based on SCr of 0.74 mg/dL). Liver Function Tests: No results for input(s): AST, ALT, ALKPHOS, BILITOT, PROT, ALBUMIN in the last 168 hours. No results for input(s): LIPASE, AMYLASE in the last 168 hours. No results for input(s): AMMONIA in the last 168 hours. Coagulation Profile: No results for input(s): INR, PROTIME in the last 168 hours. Cardiac Enzymes: No results for input(s): CKTOTAL, CKMB, CKMBINDEX, TROPONINI in the last 168 hours. BNP (last 3 results) No results for input(s): PROBNP in the last 8760 hours. HbA1C: Recent Labs    08/28/19 0506  HGBA1C 5.7*   CBG: No results for input(s): GLUCAP in the last 168 hours. Lipid Profile: No results for input(s): CHOL, HDL, LDLCALC, TRIG, CHOLHDL, LDLDIRECT in the last 72 hours. Thyroid Function Tests: No results for input(s): TSH, T4TOTAL, FREET4, T3FREE, THYROIDAB in the last 72 hours. Anemia Panel: No results for input(s): VITAMINB12, FOLATE, FERRITIN, TIBC, IRON, RETICCTPCT in the last 72 hours. Urine analysis:    Component Value Date/Time   BILIRUBINUR n 09/23/2012 1111   PROTEINUR n 09/23/2012 1111   UROBILINOGEN 0.2 09/23/2012 1111   NITRITE n 09/23/2012 1111   LEUKOCYTESUR Negative 09/23/2012 1111   Sepsis Labs: @LABRCNTIP (procalcitonin:4,lacticidven:4)  ) Recent Results (from the past 240 hour(s))  SARS Coronavirus 2 by RT PCR (hospital order, performed in [facility] hospital lab) Nasopharyngeal Nasopharyngeal Swab     Status: None   Collection Time: 08/27/19  8:48 AM   Specimen: Nasopharyngeal Swab  Result Value Ref Range Status   SARS Coronavirus 2 NEGATIVE NEGATIVE Final    Comment: (NOTE) SARS-CoV-2 target nucleic acids are NOT  DETECTED.  The SARS-CoV-2 RNA is generally detectable in upper and lower respiratory specimens during the acute phase of infection. The lowest concentration of SARS-CoV-2 viral copies this assay can detect is 250 copies / mL. A negative result does not preclude SARS-CoV-2 infection and should not be used as the sole basis for treatment or other patient management decisions.  A negative result may occur with improper specimen collection / handling, submission of specimen other than nasopharyngeal swab, presence of viral mutation(s) within the areas targeted by this assay, and inadequate number of viral copies (<250 copies / mL). A negative result must be combined with clinical observations, patient history, and epidemiological information.  Fact Sheet for Patients:   StrictlyIdeas.no  Fact Sheet for Healthcare Providers: BankingDealers.co.za  This test is not yet approved or  cleared by the [place] FDA and has been authorized for detection and/or diagnosis of SARS-CoV-2 by FDA under an Emergency  Use Authorization (EUA).  This EUA will remain in effect (meaning this test can be used) for the duration of the COVID-19 declaration under Section 564(b)(1) of the Act, 21 U.S.C. section 360bbb-3(b)(1), unless the authorization is terminated or revoked sooner.  Performed at [facility], [place] [address], [place], [place] [address]       Studies: No results found.  Scheduled Meds: . acetaminophen  1,000 mg Oral Q8H  . budesonide (PULMICORT) nebulizer solution  0.5 mg Nebulization BID  . doxycycline  100 mg Oral Q12H  . enoxaparin (LOVENOX) injection  40 mg Subcutaneous Q24H  . ipratropium-albuterol  3 mL Nebulization TID  . lamoTRIgine  200 mg Oral QHS  . loratadine  10 mg Oral Daily  . methylphenidate  18 mg Oral Daily  . montelukast  10 mg Oral QHS  . pantoprazole  40 mg Oral Q0600  . predniSONE  40 mg Oral  Q breakfast  . QUEtiapine  100 mg Oral QHS  . venlafaxine XR  150 mg Oral QHS    Continuous Infusions:   LOS: 1 day     Alma Friendly, MD [facility]  If 7PM-7AM, please contact night-coverage [email or web address] 08/29/2019, 3:56 PM

## 2019-08-29 NOTE — Evaluation (Signed)
Physical Therapy Evaluation Patient Details Name: Julia Daniels MRN: [id number] DOB: Nov 06, 1950 Today's Date: 08/29/2019   History of Present Illness  Julia Daniels is a 69 y.o. female with history of asthma/COPD on as needed O2, anxiety, bipolar disorder, depression and ADD admitted for COPD exacerbation.  Clinical Impression  Pt admitted with above diagnosis. Pt agreeable to OOB, however with significant new onset thoracic back pain limiting activity. Reviewed back precautions/positioning. Pt fatigues quickly but maintaining SpO2=90-93% on 3L [place] O2. Would benefit from rollator and HHPT at d/c. Will follow in acute setting.   Pt currently with functional limitations due to the deficits listed below (see PT Problem List). Pt will benefit from skilled PT to increase their independence and safety with mobility to allow discharge to the venue listed below.       Follow Up Recommendations Home health PT    Equipment Recommendations  Other (comment) (rollator/4 wheeled walker)    Recommendations for Other Services       Precautions / Restrictions Precautions Precautions: None Precaution Comments: Thoracic Back pain, monitor O2 sats Restrictions Weight Bearing Restrictions: No      Mobility  Bed Mobility Overal bed mobility: Needs Assistance Bed Mobility: Rolling;Sidelying to Sit;Sit to Sidelying Rolling: Min assist Sidelying to sit: Min assist     Sit to sidelying: Min assist General bed mobility comments: rolling d/t back/Tspine pain, assist to elevate trunk, assist to bring LEs onto bed. incr time  Transfers Overall transfer level: Needs assistance Equipment used: Rolling walker (2 wheeled) Transfers: Sit to/from Stand Sit to Stand: Min guard         General transfer comment: cues for hand placement and use of LEs to power up  Ambulation/Gait Ambulation/Gait assistance: Min guard Gait Distance (Feet): 10 Feet (5' fwd and back) Assistive device: Rolling  walker (2 wheeled) Gait Pattern/deviations: Step-through pattern;Decreased stride length Gait velocity: decr   General Gait Details: cues for breathing, posture, RW safety  Stairs            Wheelchair Mobility    Modified Rankin (Stroke Patients Only)       Balance Overall balance assessment: Needs assistance   Sitting balance-Leahy Scale: Fair Sitting balance - Comments: guarded d/t back pain   Standing balance support: During functional activity;Bilateral upper extremity supported Standing balance-Leahy Scale: Fair Standing balance comment: reliant on UEs for dynamic tasks                             Pertinent Vitals/Pain Pain Assessment: Faces Faces Pain Scale: Hurts even more Pain Location: Upper back - with movement Pain Descriptors / Indicators: Grimacing;Guarding;Sharp Pain Intervention(s): Limited activity within patient's tolerance;Monitored during session;Heat applied;Repositioned;RN gave pain meds during session (kpad)    Home Living Family/patient expects to be discharged to:: Private residence Living Arrangements: Children (son occasionally) Available Help at Discharge: Family;Available PRN/intermittently Type of Home: House Home Access: Stairs to enter Entrance Stairs-Rails: Right Entrance Stairs-Number of Steps: 4 Home Layout: One level Home Equipment: None      Prior Function Level of Independence: Independent         Comments: 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 Reports fatiguing at night and with increased activity. Reports using O2 PRN at night. fatigues amb at work, worse over the last year per pt report as she has not had much activity outside work     Journalist, newspaper        Extremity/Trunk  Assessment   Upper Extremity Assessment Upper Extremity Assessment: Overall WFL for tasks assessed;Defer to OT evaluation    Lower Extremity Assessment Lower Extremity Assessment: Overall WFL for tasks assessed     Cervical / Trunk Assessment Cervical / Trunk Assessment: Other exceptions Cervical / Trunk Exceptions: rounded shoulders  Communication   Communication: No difficulties  Cognition Arousal/Alertness: Awake/alert Behavior During Therapy: WFL for tasks assessed/performed Overall Cognitive Status: Within Functional Limits for tasks assessed                                        General Comments      Exercises     Assessment/Plan    PT Assessment Patient needs continued PT services  PT Problem List Decreased mobility;Decreased activity tolerance;Decreased balance;Decreased knowledge of use of DME;Pain;Cardiopulmonary status limiting activity       PT Treatment Interventions DME instruction;Therapeutic exercise;Gait training;Functional mobility training;Therapeutic activities;Patient/family education    PT Goals (Current goals can be found in the Care Plan section)  Acute Rehab PT Goals Patient Stated Goal: To improve endurance/breathing PT Goal Formulation: With patient Time For Goal Achievement: 09/12/19 Potential to Achieve Goals: Good    Frequency Min 3X/week   Barriers to discharge        Co-evaluation               AM-PAC PT "6 Clicks" Mobility  Outcome Measure Help needed turning from your back to your side while in a flat bed without using bedrails?: A Little Help needed moving from lying on your back to sitting on the side of a flat bed without using bedrails?: A Little Help needed moving to and from a bed to a chair (including a wheelchair)?: A Little Help needed standing up from a chair using your arms (e.g., wheelchair or bedside chair)?: A Little Help needed to walk in hospital room?: A Little Help needed climbing 3-5 steps with a railing? : A Lot 6 Click Score: 17    End of Session Equipment Utilized During Treatment: Gait belt Activity Tolerance: Patient tolerated treatment well Patient left: in bed;with call bell/phone within  reach;with bed alarm set   PT Visit Diagnosis: Difficulty in walking, not elsewhere classified (R26.2)    Time: 4742-5956 PT Time Calculation (min) (ACUTE ONLY): 25 min   Charges:   PT Evaluation $PT Eval Low Complexity: 1 Low PT Treatments $Gait Training: 8-22 mins        Baxter Flattery, PT  Acute Rehab Dept ([place]) [phone] Pager [phone]  08/29/2019   [facility] 08/29/2019, 2:56 PM

## 2019-08-29 NOTE — Telephone Encounter (Signed)
Fasenra Shipment Received:  30mg  #1 prefilled syringe Medication arrival date: 08/29/19 Lot #: [id number] Exp date: 12/08/2020 Received by: Elliot Dally

## 2019-08-30 LAB — CBC WITH DIFFERENTIAL/PLATELET
Abs Immature Granulocytes: 0.21 10*3/uL — ABNORMAL HIGH (ref 0.00–0.07)
Basophils Absolute: 0 10*3/uL (ref 0.0–0.1)
Basophils Relative: 0 %
Eosinophils Absolute: 0 10*3/uL (ref 0.0–0.5)
Eosinophils Relative: 0 %
HCT: 44.2 % (ref 36.0–46.0)
Hemoglobin: 14.2 g/dL (ref 12.0–15.0)
Immature Granulocytes: 1 %
Lymphocytes Relative: 12 %
Lymphs Abs: 1.9 10*3/uL (ref 0.7–4.0)
MCH: 32.4 pg (ref 26.0–34.0)
MCHC: 32.1 g/dL (ref 30.0–36.0)
MCV: 100.9 fL — ABNORMAL HIGH (ref 80.0–100.0)
Monocytes Absolute: 1.4 10*3/uL — ABNORMAL HIGH (ref 0.1–1.0)
Monocytes Relative: 9 %
Neutro Abs: 12.3 10*3/uL — ABNORMAL HIGH (ref 1.7–7.7)
Neutrophils Relative %: 78 %
Platelets: 312 10*3/uL (ref 150–400)
RBC: 4.38 MIL/uL (ref 3.87–5.11)
RDW: 14.1 % (ref 11.5–15.5)
WBC: 15.8 10*3/uL — ABNORMAL HIGH (ref 4.0–10.5)
nRBC: 0 % (ref 0.0–0.2)

## 2019-08-30 MED ORDER — HYDROCODONE-HOMATROPINE 5-1.5 MG/5ML PO SYRP: 5.0000 mL | ORAL_SOLUTION | Freq: Four times a day (QID) | ORAL | Status: DC | PRN

## 2019-08-30 MED ORDER — AMLODIPINE BESYLATE 5 MG PO TABS
5.0000 mg | ORAL_TABLET | Freq: Every day | ORAL | Status: DC
  Administered 2019-08-30 – 2019-09-02 (×4): 5 mg via ORAL
  Filled 2019-08-30 (×4): qty 1

## 2019-08-30 MED ORDER — METHOCARBAMOL 500 MG PO TABS
500.0000 mg | ORAL_TABLET | Freq: Three times a day (TID) | ORAL | Status: DC | PRN
  Administered 2019-08-30 – 2019-09-01 (×6): 500 mg via ORAL
  Filled 2019-08-30 (×6): qty 1

## 2019-08-30 MED ORDER — ALPRAZOLAM 0.25 MG PO TABS
0.2500 mg | ORAL_TABLET | Freq: Two times a day (BID) | ORAL | Status: DC | PRN
  Administered 2019-08-30 – 2019-09-01 (×3): 0.25 mg via ORAL
  Filled 2019-08-30 (×3): qty 1

## 2019-08-30 MED ORDER — METHOCARBAMOL 500 MG PO TABS
500.0000 mg | ORAL_TABLET | Freq: Once | ORAL | Status: AC
  Administered 2019-08-30: 500 mg via ORAL
  Filled 2019-08-30: qty 1

## 2019-08-30 MED ORDER — DM-GUAIFENESIN ER 30-600 MG PO TB12
1.0000 | ORAL_TABLET | Freq: Two times a day (BID) | ORAL | Status: DC
  Administered 2019-08-30 – 2019-09-02 (×7): 1 via ORAL
  Filled 2019-08-30 (×7): qty 1

## 2019-08-30 NOTE — Progress Notes (Signed)
Physical Therapy Treatment Patient Details Name: Julia Daniels MRN: [id number] DOB: 02/12/1950 Today's Date: 08/30/2019    History of Present Illness MATYLDA FEHRING is a 69 y.o. female with history of asthma/COPD on as needed O2, anxiety, bipolar disorder, depression and ADD admitted for COPD exacerbation.    PT Comments    Pt progressing although continues to fatigue quickly. incr activity tolerance, able to amb short distance into hallway with one standing rest. SpO2=87 to 92% on 3L throughout.  Follow Up Recommendations  Home health PT;Supervision - Intermittent     Equipment Recommendations  Other (comment) (Rollator)    Recommendations for Other Services       Precautions / Restrictions Precautions Precautions: None Precaution Comments: Thoracic Back pain, monitor O2 sats Restrictions Weight Bearing Restrictions: No    Mobility  Bed Mobility Overal bed mobility: Needs Assistance Bed Mobility: Rolling;Sidelying to Sit;Sit to Sidelying Rolling: Min guard Sidelying to sit: Min assist;HOB elevated Supine to sit: HOB elevated;Supervision   Sit to sidelying: Min assist General bed mobility comments: pt uses rail to roll, attempted on her own, unabel d/t pain requiring min assist  to elevate trunk fully, assist to bring LEs on to bed d/t back pain with lifting LEs  Transfers Overall transfer level: Needs assistance Equipment used: Rolling walker (2 wheeled) Transfers: Sit to/from Stand Sit to Stand: Min guard         General transfer comment: cues for hand placement and use of LEs to power up  Ambulation/Gait Ambulation/Gait assistance: Min guard Gait Distance (Feet): 22 Feet Assistive device: Rolling walker (2 wheeled) Gait Pattern/deviations: Step-through pattern;Decreased stride length Gait velocity: decr   General Gait Details: cues for breathing, posture, RW safety. one rest break d/t fatigue    Stairs             Wheelchair  Mobility    Modified Rankin (Stroke Patients Only)       Balance Overall balance assessment: Needs assistance   Sitting balance-Leahy Scale: Fair Sitting balance - Comments: guarded d/t back pain   Standing balance support: During functional activity;Bilateral upper extremity supported Standing balance-Leahy Scale: Fair Standing balance comment: reliant on UEs for dynamic tasks                            Cognition Arousal/Alertness: Awake/alert Behavior During Therapy: WFL for tasks assessed/performed Overall Cognitive Status: Within Functional Limits for tasks assessed                                        Exercises      General Comments General comments (skin integrity, edema, etc.): pt on 3L O2,  SpO2= 87-92% during PT session, 2-3/4 DOE       Pertinent Vitals/Pain Pain Assessment: Faces Faces Pain Scale: Hurts even more Pain Location: Upper back - with movement Pain Descriptors / Indicators: Grimacing;Guarding;Sharp Pain Intervention(s): Limited activity within patient's tolerance;Monitored during session;Premedicated before session;Heat applied (kpad)    Home Living                      Prior Function            PT Goals (current goals can now be found in the care plan section) Acute Rehab PT Goals Patient Stated Goal: To improve endurance/breathing PT Goal Formulation: With patient Time For Goal Achievement:  09/12/19 Potential to Achieve Goals: Good Progress towards PT goals: Progressing toward goals    Frequency    Min 3X/week      PT Plan Current plan remains appropriate    Co-evaluation              AM-PAC PT "6 Clicks" Mobility   Outcome Measure  Help needed turning from your back to your side while in a flat bed without using bedrails?: A Little Help needed moving from lying on your back to sitting on the side of a flat bed without using bedrails?: A Little Help needed moving to and from a bed  to a chair (including a wheelchair)?: A Little Help needed standing up from a chair using your arms (e.g., wheelchair or bedside chair)?: A Little Help needed to walk in hospital room?: A Little Help needed climbing 3-5 steps with a railing? : A Little 6 Click Score: 18    End of Session Equipment Utilized During Treatment: Gait belt Activity Tolerance: Patient tolerated treatment well Patient left: in bed;with call bell/phone within reach;with bed alarm set   PT Visit Diagnosis: Difficulty in walking, not elsewhere classified (R26.2)     Time: 5830-9407 PT Time Calculation (min) (ACUTE ONLY): 23 min  Charges:  $Gait Training: 8-22 mins $Therapeutic Activity: 8-22 mins                     Baxter Flattery, PT  Acute Rehab Dept ([place]) [phone] Pager [phone]  08/30/2019    [facility] 08/30/2019, 12:37 PM

## 2019-08-30 NOTE — Progress Notes (Signed)
Occupational Therapy Treatment Patient Details Name: Julia Daniels MRN: [id number] DOB: [birth year] Today's Date: 08/30/2019    History of present illness Julia Daniels is a 69 y.o. female with history of asthma/COPD on as needed O2, anxiety, bipolar disorder, depression and ADD admitted for COPD exacerbation.   OT comments  Pt received sitting up reclined in bed, awake and alert, no complaints of SOB but some back pain (heat pack provided). Session addressed energy conservation techs with verbal description, tech back and hand out provided to to ensure understanding of strategies. Pt encouraged to continue of pursed lip techs and education of safety with work environment due to low activity tolerance. Pt very appreciative of the EC strategies and feels it will be useful in home setting and for work. OT will continue to follow to address established deficits and to maximize independence prior to dc. DC and Freq remains the same.     Follow Up Recommendations  No OT follow up    Equipment Recommendations  Tub/shower seat    Recommendations for Other Services      Precautions / Restrictions Precautions Precautions: None Precaution Comments: Thoracic Back pain, monitor O2 sats Restrictions Weight Bearing Restrictions: No       Mobility Bed Mobility Overal bed mobility: Needs Assistance Bed Mobility: Rolling;Sidelying to Sit;Sit to Sidelying     Supine to sit: HOB elevated;Supervision     General bed mobility comments: pt initiated in reclined sitting, to present BLE to EOB, increased time required due to pain however no physical assistance required from therapist.   Transfers Overall transfer level: Needs assistance                    Balance Overall balance assessment: Needs assistance   Sitting balance-Leahy Scale: Fair Sitting balance - Comments: guarded d/t back pain   Standing balance support: During functional activity;Bilateral upper extremity  supported Standing balance-Leahy Scale: Fair Standing balance comment: reliant on UEs for dynamic tasks                           ADL either performed or assessed with clinical judgement   ADL Overall ADL's : Needs assistance/impaired     Grooming: Set up;Sitting Grooming Details (indicate cue type and reason): pt desat to 86% on 4L, encouraged to perform pursed lip breathing tech from prior session.                               General ADL Comments: Pt educated of energy conservation tech with handout provided, with teach back and questions answered.      Vision   Vision Assessment?: No apparent visual deficits   Perception     Praxis      Cognition Arousal/Alertness: Awake/alert Behavior During Therapy: WFL for tasks assessed/performed Overall Cognitive Status: Within Functional Limits for tasks assessed                                          Exercises     Shoulder Instructions       General Comments 4L O2, O2 sat range 86-93%    Pertinent Vitals/ Pain       Pain Assessment: Faces Faces Pain Scale: Hurts even more Pain Location: Upper back - with movement Pain Descriptors / Indicators:  Grimacing;Guarding;Sharp Pain Intervention(s): Limited activity within patient's tolerance;Monitored during session;Repositioned  Home Living                                          Prior Functioning/Environment              Frequency  Min 2X/week        Progress Toward Goals  OT Goals(current goals can now be found in the care plan section)  Progress towards OT goals: Progressing toward goals  Acute Rehab OT Goals Patient Stated Goal: To improve endurance/breathing OT Goal Formulation: With patient Time For Goal Achievement: 09/11/19 Potential to Achieve Goals: Good ADL Goals Pt Will Transfer to Toilet: with supervision;ambulating;regular height toilet Pt Will Perform Toileting - Clothing  Manipulation and hygiene: with supervision;sit to/from stand Additional ADL Goal #1: Patient will stand at sink to perform grooming task as evidence of improving activity tolerance Additional ADL Goal #2: Patinet will verbalize understanding and demonstrateretention of information of energy conservation principles and pursed lipped breathing technique prior to dc  Plan Discharge plan remains appropriate    Co-evaluation                 AM-PAC OT "6 Clicks" Daily Activity     Outcome Measure   Help from another person eating meals?: None Help from another person taking care of personal grooming?: A Little Help from another person toileting, which includes using toliet, bedpan, or urinal?: A Little Help from another person bathing (including washing, rinsing, drying)?: A Little Help from another person to put on and taking off regular upper body clothing?: A Little Help from another person to put on and taking off regular lower body clothing?: A Little 6 Click Score: 19    End of Session Equipment Utilized During Treatment: Oxygen  OT Visit Diagnosis: Pain Pain - part of body:  (upper back)   Activity Tolerance Patient tolerated treatment well   Patient Left with call bell/phone within reach;in bed;with nursing/sitter in room   Nurse Communication  (o2 sats, back pain)        Time: 0277-4128 OT Time Calculation (min): 19 min  Charges: OT General Charges $OT Visit: 1 Visit OT Treatments $Self Care/Home Management : 8-22 mins  Minus Breeding, MSOT, OTR/L  Supplemental Rehabilitation Services  [phone]    Marius Ditch 08/30/2019, 10:59 AM

## 2019-08-30 NOTE — Progress Notes (Signed)
Pt c/o 10/10 muscle spasm pain in R upper shoulder with coughing. Pt states pain takes her breath away. On call Blount paged, ordered robaxin.   Pt BP 170/112 @ 2354. PRN hydralizine given. BP came down to 169/108. On call Blount paged 2x to notify of BP and pt anxious. No new orders at this time.   Pt currently sleeping, in no distress.

## 2019-08-30 NOTE — Progress Notes (Signed)
BP 143/103 this AM. Not time for PRN hydralazine. On call MD Blount paged. No new orders.

## 2019-08-30 NOTE — Progress Notes (Addendum)
PROGRESS NOTE  Julia Daniels MLY:[id number] DOB: 1950-12-07 DOA: 08/27/2019 PCP: Simona Huh, NP  HPI/Recap of past 24 hours: HPI from Dr Margot Chimes is a 69 y.o. female with history of asthma/COPD on as needed O2, anxiety, bipolar disorder, depression and ADD presenting with the above chief complaints. Patient reports worsening SOB for the last 3 to 4 days, dyspnea with minimal exertion, with productive cough with whitish phlegm. She is fully vaccinated against COVID-19.  Denies known exposure to COVID-19 or sick contact.  Reports good compliance with her breathing treatments. Son called her pulmonologist out of concern about shortness of breath, wheezing and some mental status change, and was advised to call EMS. Patient received IV Solu-Medrol and nebulizers in route to ED. Lives with her son.  Denies smoking cigarettes, drinking alcohol or recreational drug use. In ED, hemodynamically stable.  Saturating low 90s on 4 L.  Glucose 134.  WBC 19.  MCV 101.5.  Otherwise CBC and BMP without significant finding.  CXR without significant finding.  COVID-19 PCR negative.  Hospitalist service consulted for admission for COPD exacerbation.    Overnight, patient noted to be significantly anxious, more short of breath, cough with associated back pain.  This morning reports feeling better, still with wheezing bilaterally, denies any chest pain, abdominal pain, nausea/vomiting, fever/chills, dizziness.     Assessment/Plan: Active Problems:   COPD with acute exacerbation (HCC)   COPD exacerbation (HCC)   Acute COPD exacerbation Acute on chronic hypoxic respiratory failure Currently requiring about 3-4 L of O2, saturating in the low 90s Afebrile, with leukocytosis (steroids) Chest x-ray without any acute finding Continue steroids, doxycycline, nebulizers and inhalers Incentive spirometry, supplemental oxygen, wean as able PT/OT Monitor closely  Prediabetes A1c 5.7 Daily  CBG Outpatient follow-up  Elevated BP As per patient, no prior history of hypertension, not on any medication Possible due to anxiety, COPD exacerbation, pain Started patient on Norvasc 5 mg daily, will adjust pending BP, hydralazine as needed  Anxiety/depression/bipolar/ADD Continue Effexor, quetiapine, methylphenidate, Lamictal, Xanax as needed  GERD Continue PPI  Obesity Lifestyle modification advised       Malnutrition Type:      Malnutrition Characteristics:      Nutrition Interventions:       Estimated body mass index is 34.72 kg/m as calculated from the following:   Height as of this encounter: 5\' 3"  (1.6 m).   Weight as of this encounter: 88.9 kg.     Code Status: Full  Family Communication: Discussed with patient  Disposition Plan: Status is: Inpatient  The patient will require care spanning > 2 midnights and should be moved to inpatient because: Inpatient level of care appropriate due to severity of illness  Dispo: The patient is from: Home              Anticipated d/c is to: Home              Anticipated d/c date is: 2 days              Patient currently is not medically stable to d/c.    Consultants:  None  Procedures:  None  Antimicrobials:  Doxycycline  DVT prophylaxis: Lovenox   Objective: Vitals:   08/30/19 0013 08/30/19 0523 08/30/19 0720 08/30/19 1316  BP: (!) 169/108 (!) 143/103  (!) 144/100  Pulse:  88  92  Resp:  18  16  Temp:  98.5 F (36.9 C)  98.2 F (36.8 C)  TempSrc:  Oral  Oral  SpO2:   95% 95%  Weight:      Height:        Intake/Output Summary (Last 24 hours) at 08/30/2019 1435 Last data filed at 08/30/2019 1258 Gross per 24 hour  Intake 840 ml  Output 1800 ml  Net -960 ml   Filed Weights   08/27/19 0852  Weight: 88.9 kg    Exam:  General: NAD, anxious  Cardiovascular: S1, S2 present  Respiratory:  Bilateral wheezing noted with diminished breath sounds  Abdomen: Soft, nontender,  nondistended, bowel sounds present  Musculoskeletal: No bilateral pedal edema noted  Skin: Normal  Psychiatry:  Anxious mood   Data Reviewed: CBC: Recent Labs  Lab 08/27/19 0846 08/29/19 0446 08/30/19 0548  WBC 19.1* 16.7* 15.8*  NEUTROABS  --  13.1* 12.3*  HGB 12.9 13.3 14.2  HCT 40.1 41.1 44.2  MCV 101.5* 101.2* 100.9*  PLT 290 293 431   Basic Metabolic Panel: Recent Labs  Lab 08/27/19 0846 08/29/19 0446  NA 136 136  K 4.5 4.6  CL 99 100  CO2 27 27  GLUCOSE 134* 104*  BUN 18 24*  CREATININE 0.75 0.74  CALCIUM 10.1 9.9   GFR: Estimated Creatinine Clearance: 73.2 mL/min (by C-G formula based on SCr of 0.74 mg/dL). Liver Function Tests: No results for input(s): AST, ALT, ALKPHOS, BILITOT, PROT, ALBUMIN in the last 168 hours. No results for input(s): LIPASE, AMYLASE in the last 168 hours. No results for input(s): AMMONIA in the last 168 hours. Coagulation Profile: No results for input(s): INR, PROTIME in the last 168 hours. Cardiac Enzymes: No results for input(s): CKTOTAL, CKMB, CKMBINDEX, TROPONINI in the last 168 hours. BNP (last 3 results) No results for input(s): PROBNP in the last 8760 hours. HbA1C: Recent Labs    08/28/19 0506  HGBA1C 5.7*   CBG: No results for input(s): GLUCAP in the last 168 hours. Lipid Profile: No results for input(s): CHOL, HDL, LDLCALC, TRIG, CHOLHDL, LDLDIRECT in the last 72 hours. Thyroid Function Tests: No results for input(s): TSH, T4TOTAL, FREET4, T3FREE, THYROIDAB in the last 72 hours. Anemia Panel: No results for input(s): VITAMINB12, FOLATE, FERRITIN, TIBC, IRON, RETICCTPCT in the last 72 hours. Urine analysis:    Component Value Date/Time   BILIRUBINUR n 09/23/2012 1111   PROTEINUR n 09/23/2012 1111   UROBILINOGEN 0.2 09/23/2012 1111   NITRITE n 09/23/2012 1111   LEUKOCYTESUR Negative 09/23/2012 1111   Sepsis Labs: @LABRCNTIP (procalcitonin:4,lacticidven:4)  ) Recent Results (from the past 240 hour(s))   SARS Coronavirus 2 by RT PCR (hospital order, performed in [facility] hospital lab) Nasopharyngeal Nasopharyngeal Swab     Status: None   Collection Time: 08/27/19  8:48 AM   Specimen: Nasopharyngeal Swab  Result Value Ref Range Status   SARS Coronavirus 2 NEGATIVE NEGATIVE Final    Comment: (NOTE) SARS-CoV-2 target nucleic acids are NOT DETECTED.  The SARS-CoV-2 RNA is generally detectable in upper and lower respiratory specimens during the acute phase of infection. The lowest concentration of SARS-CoV-2 viral copies this assay can detect is 250 copies / mL. A negative result does not preclude SARS-CoV-2 infection and should not be used as the sole basis for treatment or other patient management decisions.  A negative result may occur with improper specimen collection / handling, submission of specimen other than nasopharyngeal swab, presence of viral mutation(s) within the areas targeted by this assay, and inadequate number of viral copies (<250 copies / mL). A negative result must be combined with  clinical observations, patient history, and epidemiological information.  Fact Sheet for Patients:   StrictlyIdeas.no  Fact Sheet for Healthcare Providers: BankingDealers.co.za  This test is not yet approved or  cleared by the [place] FDA and has been authorized for detection and/or diagnosis of SARS-CoV-2 by FDA under an Emergency Use Authorization (EUA).  This EUA will remain in effect (meaning this test can be used) for the duration of the COVID-19 declaration under Section 564(b)(1) of the Act, 21 U.S.C. section 360bbb-3(b)(1), unless the authorization is terminated or revoked sooner.  Performed at [facility], [place] [address] [place], [place] [address]       Studies: No results found.  Scheduled Meds: . acetaminophen  1,000 mg Oral Q8H  . amLODipine  5 mg Oral Daily  . budesonide (PULMICORT)  nebulizer solution  0.5 mg Nebulization BID  . dextromethorphan-guaiFENesin  1 tablet Oral BID  . doxycycline  100 mg Oral Q12H  . enoxaparin (LOVENOX) injection  40 mg Subcutaneous Q24H  . ipratropium-albuterol  3 mL Nebulization TID  . lamoTRIgine  200 mg Oral QHS  . loratadine  10 mg Oral Daily  . methylphenidate  18 mg Oral Daily  . methylPREDNISolone (SOLU-MEDROL) injection  60 mg Intravenous Q12H  . montelukast  10 mg Oral QHS  . pantoprazole  40 mg Oral Q0600  . QUEtiapine  100 mg Oral QHS  . venlafaxine XR  150 mg Oral QHS    Continuous Infusions:   LOS: 2 days     Alma Friendly, MD [facility]  If 7PM-7AM, please contact night-coverage [email or web address] 08/30/2019, 2:35 PM

## 2019-08-31 NOTE — Progress Notes (Signed)
PROGRESS NOTE  [name] RKY:[id number] DOB: [birth year] DOA: 08/27/2019 PCP: Simona Huh, NP  HPI/Recap of past 24 hours: HPI from Dr Margot Chimes is a 69 y.o. female with history of asthma/COPD on as needed O2, anxiety, bipolar disorder, depression and ADD presenting with the above chief complaints. Patient reports worsening SOB for the last 3 to 4 days, dyspnea with minimal exertion, with productive cough with whitish phlegm. She is fully vaccinated against COVID-19.  Denies known exposure to COVID-19 or sick contact.  Reports good compliance with her breathing treatments. Son called her pulmonologist out of concern about shortness of breath, wheezing and some mental status change, and was advised to call EMS. Patient received IV Solu-Medrol and nebulizers in route to ED. Lives with her son.  Denies smoking cigarettes, drinking alcohol or recreational drug use. In ED, hemodynamically stable.  Saturating low 90s on 4 L.  Glucose 134.  WBC 19.  MCV 101.5.  Otherwise CBC and BMP without significant finding.  CXR without significant finding.  COVID-19 PCR negative.  Hospitalist service consulted for admission for COPD exacerbation.    Today, patient still wheezing bilaterally, still requiring about 4 L of oxygen.      Assessment/Plan: Active Problems:   COPD with acute exacerbation (HCC)   COPD exacerbation (HCC)   Acute COPD exacerbation Acute on chronic hypoxic respiratory failure Currently requiring about 3-4 L of O2, saturating in the low 90s Afebrile, with leukocytosis (steroids) Chest x-ray without any acute finding Continue steroids, doxycycline, nebulizers and inhalers Incentive spirometry, supplemental oxygen, wean as able PT/OT Monitor closely  Prediabetes A1c 5.7 Daily CBG Outpatient follow-up  Elevated BP As per patient, no prior history of hypertension, not on any medication Possible due to anxiety, COPD exacerbation, pain Continue Norvasc  5 mg daily, will adjust pending BP, hydralazine as needed  Anxiety/depression/bipolar/ADD Continue Effexor, quetiapine, methylphenidate, Lamictal, Xanax as needed  GERD Continue PPI  Obesity Lifestyle modification advised       Malnutrition Type:      Malnutrition Characteristics:      Nutrition Interventions:       Estimated body mass index is 34.72 kg/m as calculated from the following:   Height as of this encounter: 5\' 3"  (1.6 m).   Weight as of this encounter: 88.9 kg.     Code Status: Full  Family Communication: Discussed with patient  Disposition Plan: Status is: Inpatient  The patient will require care spanning > 2 midnights and should be moved to inpatient because: Inpatient level of care appropriate due to severity of illness  Dispo: The patient is from: Home              Anticipated d/c is to: Home              Anticipated d/c date is: 2 days              Patient currently is not medically stable to d/c.    Consultants:  None  Procedures:  None  Antimicrobials:  Doxycycline  DVT prophylaxis: Lovenox   Objective: Vitals:   08/30/19 2141 08/31/19 0558 08/31/19 0903 08/31/19 1315  BP: (!) 145/108 (!) 148/95  (!) 137/91  Pulse: 83 95  86  Resp: 16 18  18   Temp: 98.1 F (36.7 C) 98.1 F (36.7 C)  98.3 F (36.8 C)  TempSrc: Oral Oral  Oral  SpO2: 93% 94% 91% 95%  Weight:      Height:  Intake/Output Summary (Last 24 hours) at 08/31/2019 1346 Last data filed at 08/31/2019 1331 Gross per 24 hour  Intake 1480 ml  Output 2125 ml  Net -645 ml   Filed Weights   08/27/19 0852  Weight: 88.9 kg    Exam:  General: NAD, anxious  Cardiovascular: S1, S2 present  Respiratory:  Bilateral wheezing noted with diminished breath sounds  Abdomen: Soft, nontender, nondistended, bowel sounds present  Musculoskeletal: No bilateral pedal edema noted  Skin: Normal  Psychiatry:  Anxious mood   Data Reviewed: CBC: Recent  Labs  Lab 08/27/19 0846 08/29/19 0446 08/30/19 0548  WBC 19.1* 16.7* 15.8*  NEUTROABS  --  13.1* 12.3*  HGB 12.9 13.3 14.2  HCT 40.1 41.1 44.2  MCV 101.5* 101.2* 100.9*  PLT 290 293 323   Basic Metabolic Panel: Recent Labs  Lab 08/27/19 0846 08/29/19 0446  NA 136 136  K 4.5 4.6  CL 99 100  CO2 27 27  GLUCOSE 134* 104*  BUN 18 24*  CREATININE 0.75 0.74  CALCIUM 10.1 9.9   GFR: Estimated Creatinine Clearance: 73.2 mL/min (by C-G formula based on SCr of 0.74 mg/dL). Liver Function Tests: No results for input(s): AST, ALT, ALKPHOS, BILITOT, PROT, ALBUMIN in the last 168 hours. No results for input(s): LIPASE, AMYLASE in the last 168 hours. No results for input(s): AMMONIA in the last 168 hours. Coagulation Profile: No results for input(s): INR, PROTIME in the last 168 hours. Cardiac Enzymes: No results for input(s): CKTOTAL, CKMB, CKMBINDEX, TROPONINI in the last 168 hours. BNP (last 3 results) No results for input(s): PROBNP in the last 8760 hours. HbA1C: No results for input(s): HGBA1C in the last 72 hours. CBG: No results for input(s): GLUCAP in the last 168 hours. Lipid Profile: No results for input(s): CHOL, HDL, LDLCALC, TRIG, CHOLHDL, LDLDIRECT in the last 72 hours. Thyroid Function Tests: No results for input(s): TSH, T4TOTAL, FREET4, T3FREE, THYROIDAB in the last 72 hours. Anemia Panel: No results for input(s): VITAMINB12, FOLATE, FERRITIN, TIBC, IRON, RETICCTPCT in the last 72 hours. Urine analysis:    Component Value Date/Time   BILIRUBINUR n 09/23/2012 1111   PROTEINUR n 09/23/2012 1111   UROBILINOGEN 0.2 09/23/2012 1111   NITRITE n 09/23/2012 1111   LEUKOCYTESUR Negative 09/23/2012 1111   Sepsis Labs: @LABRCNTIP (procalcitonin:4,lacticidven:4)  ) Recent Results (from the past 240 hour(s))  SARS Coronavirus 2 by RT PCR (hospital order, performed in [facility] hospital lab) Nasopharyngeal Nasopharyngeal Swab     Status: None   Collection Time:  08/27/19  8:48 AM   Specimen: Nasopharyngeal Swab  Result Value Ref Range Status   SARS Coronavirus 2 NEGATIVE NEGATIVE Final    Comment: (NOTE) SARS-CoV-2 target nucleic acids are NOT DETECTED.  The SARS-CoV-2 RNA is generally detectable in upper and lower respiratory specimens during the acute phase of infection. The lowest concentration of SARS-CoV-2 viral copies this assay can detect is 250 copies / mL. A negative result does not preclude SARS-CoV-2 infection and should not be used as the sole basis for treatment or other patient management decisions.  A negative result may occur with improper specimen collection / handling, submission of specimen other than nasopharyngeal swab, presence of viral mutation(s) within the areas targeted by this assay, and inadequate number of viral copies (<250 copies / mL). A negative result must be combined with clinical observations, patient history, and epidemiological information.  Fact Sheet for Patients:   StrictlyIdeas.no  Fact Sheet for Healthcare Providers: BankingDealers.co.za  This test is  not yet approved or  cleared by the [place] and has been authorized for detection and/or diagnosis of SARS-CoV-2 by FDA under an Emergency Use Authorization (EUA).  This EUA will remain in effect (meaning this test can be used) for the duration of the COVID-19 declaration under Section 564(b)(1) of the Act, 21 U.S.C. section 360bbb-3(b)(1), unless the authorization is terminated or revoked sooner.  Performed at [facility], [place] [address] [place], [place] [address]       Studies: No results found.  Scheduled Meds: . acetaminophen  1,000 mg Oral Q8H  . amLODipine  5 mg Oral Daily  . budesonide (PULMICORT) nebulizer solution  0.5 mg Nebulization BID  . dextromethorphan-guaiFENesin  1 tablet Oral BID  . doxycycline  100 mg Oral Q12H  . enoxaparin (LOVENOX)  injection  40 mg Subcutaneous Q24H  . ipratropium-albuterol  3 mL Nebulization TID  . lamoTRIgine  200 mg Oral QHS  . loratadine  10 mg Oral Daily  . methylphenidate  18 mg Oral Daily  . methylPREDNISolone (SOLU-MEDROL) injection  60 mg Intravenous Q12H  . montelukast  10 mg Oral QHS  . pantoprazole  40 mg Oral Q0600  . QUEtiapine  100 mg Oral QHS  . venlafaxine XR  150 mg Oral QHS    Continuous Infusions:   LOS: 3 days     Alma Friendly, MD [facility]  If 7PM-7AM, please contact night-coverage [email or web address] 08/31/2019, 1:46 PM

## 2019-09-01 MED ORDER — METHOCARBAMOL 500 MG PO TABS
750.0000 mg | ORAL_TABLET | Freq: Three times a day (TID) | ORAL | Status: DC | PRN
  Administered 2019-09-02 (×2): 750 mg via ORAL
  Filled 2019-09-01 (×2): qty 2

## 2019-09-01 MED ORDER — METHYLPREDNISOLONE SODIUM SUCC 40 MG IJ SOLR
40.0000 mg | Freq: Two times a day (BID) | INTRAMUSCULAR | Status: DC
  Administered 2019-09-01 – 2019-09-02 (×2): 40 mg via INTRAVENOUS
  Filled 2019-09-01 (×2): qty 1

## 2019-09-01 MED ORDER — BACLOFEN 10 MG PO TABS: 5.0000 mg | ORAL_TABLET | Freq: Three times a day (TID) | ORAL | Status: DC | PRN

## 2019-09-01 NOTE — Progress Notes (Signed)
PROGRESS NOTE  Julia Daniels IRS:[id number] DOB: 1950-01-15 DOA: 08/27/2019 PCP: Simona Huh, NP  HPI/Recap of past 24 hours: HPI from Dr Margot Chimes is a 69 y.o. female with history of asthma/COPD on as needed O2, anxiety, bipolar disorder, depression and ADD presenting with the above chief complaints. Patient reports worsening SOB for the last 3 to 4 days, dyspnea with minimal exertion, with productive cough with whitish phlegm. She is fully vaccinated against COVID-19.  Denies known exposure to COVID-19 or sick contact.  Reports good compliance with her breathing treatments. Son called her pulmonologist out of concern about shortness of breath, wheezing and some mental status change, and was advised to call EMS. Patient received IV Solu-Medrol and nebulizers in route to ED. Lives with her son.  Denies smoking cigarettes, drinking alcohol or recreational drug use. In ED, hemodynamically stable.  Saturating low 90s on 4 L.  Glucose 134.  WBC 19.  MCV 101.5.  Otherwise CBC and BMP without significant finding.  CXR without significant finding.  COVID-19 PCR negative.  Hospitalist service consulted for admission for COPD exacerbation.    Today, pt reports some improvement in her breathing.. Still wheezing bilaterally.  Reports resolved cough.     Assessment/Plan: Active Problems:   COPD with acute exacerbation (HCC)   COPD exacerbation (HCC)   Acute COPD exacerbation Acute on chronic hypoxic respiratory failure Currently requiring about 2 L of O2, saturating in the 90s Afebrile, with leukocytosis (steroids) Chest x-ray without any acute finding Continue steroids, completed doxycycline Continue nebulizers and inhalers Incentive spirometry, supplemental oxygen, wean as able PT/OT Monitor closely  Prediabetes A1c 5.7 Daily CBG Outpatient follow-up  Elevated BP As per patient, no prior history of hypertension, not on any medication Possible due to anxiety,  COPD exacerbation, pain Continue Norvasc 5 mg daily, will adjust pending BP, hydralazine as needed  Anxiety/depression/bipolar/ADD Continue Effexor, quetiapine, methylphenidate, Lamictal, Xanax as needed  GERD Continue PPI  Obesity Lifestyle modification advised       Malnutrition Type:      Malnutrition Characteristics:      Nutrition Interventions:       Estimated body mass index is 34.72 kg/m as calculated from the following:   Height as of this encounter: 5\' 3"  (1.6 m).   Weight as of this encounter: 88.9 kg.     Code Status: Full  Family Communication: Discussed with patient  Disposition Plan: Status is: Inpatient  The patient will require care spanning > 2 midnights and should be moved to inpatient because: Inpatient level of care appropriate due to severity of illness  Dispo: The patient is from: Home              Anticipated d/c is to: Home              Anticipated d/c date is: 1 day              Patient currently is not medically stable to d/c.    Consultants:  None  Procedures:  None  Antimicrobials:  Completed doxycycline  DVT prophylaxis: Lovenox   Objective: Vitals:   08/31/19 2142 09/01/19 0509 09/01/19 0711 09/01/19 0814  BP: (!) 135/91 (!) 132/96 (!) 138/96   Pulse: 90 97 93   Resp: 18 16 18    Temp: 98.5 F (36.9 C) 98.8 F (37.1 C) 98.5 F (36.9 C)   TempSrc: Oral Oral Oral   SpO2: 95% 97% 97% 93%  Weight:      Height:  Intake/Output Summary (Last 24 hours) at 09/01/2019 1312 Last data filed at 09/01/2019 1107 Gross per 24 hour  Intake 990 ml  Output 1600 ml  Net -610 ml   Filed Weights   08/27/19 0852  Weight: 88.9 kg    Exam:  General: NAD, anxious  Cardiovascular: S1, S2 present  Respiratory:  Bilateral wheezing noted with diminished breath sounds  Abdomen: Soft, nontender, nondistended, bowel sounds present  Musculoskeletal: No bilateral pedal edema noted  Skin: Normal  Psychiatry:   Anxious mood   Data Reviewed: CBC: Recent Labs  Lab 08/27/19 0846 08/29/19 0446 08/30/19 0548  WBC 19.1* 16.7* 15.8*  NEUTROABS  --  13.1* 12.3*  HGB 12.9 13.3 14.2  HCT 40.1 41.1 44.2  MCV 101.5* 101.2* 100.9*  PLT 290 293 287   Basic Metabolic Panel: Recent Labs  Lab 08/27/19 0846 08/29/19 0446  NA 136 136  K 4.5 4.6  CL 99 100  CO2 27 27  GLUCOSE 134* 104*  BUN 18 24*  CREATININE 0.75 0.74  CALCIUM 10.1 9.9   GFR: Estimated Creatinine Clearance: 73.2 mL/min (by C-G formula based on SCr of 0.74 mg/dL). Liver Function Tests: No results for input(s): AST, ALT, ALKPHOS, BILITOT, PROT, ALBUMIN in the last 168 hours. No results for input(s): LIPASE, AMYLASE in the last 168 hours. No results for input(s): AMMONIA in the last 168 hours. Coagulation Profile: No results for input(s): INR, PROTIME in the last 168 hours. Cardiac Enzymes: No results for input(s): CKTOTAL, CKMB, CKMBINDEX, TROPONINI in the last 168 hours. BNP (last 3 results) No results for input(s): PROBNP in the last 8760 hours. HbA1C: No results for input(s): HGBA1C in the last 72 hours. CBG: No results for input(s): GLUCAP in the last 168 hours. Lipid Profile: No results for input(s): CHOL, HDL, LDLCALC, TRIG, CHOLHDL, LDLDIRECT in the last 72 hours. Thyroid Function Tests: No results for input(s): TSH, T4TOTAL, FREET4, T3FREE, THYROIDAB in the last 72 hours. Anemia Panel: No results for input(s): VITAMINB12, FOLATE, FERRITIN, TIBC, IRON, RETICCTPCT in the last 72 hours. Urine analysis:    Component Value Date/Time   BILIRUBINUR n 09/23/2012 1111   PROTEINUR n 09/23/2012 1111   UROBILINOGEN 0.2 09/23/2012 1111   NITRITE n 09/23/2012 1111   LEUKOCYTESUR Negative 09/23/2012 1111   Sepsis Labs: @LABRCNTIP (procalcitonin:4,lacticidven:4)  ) Recent Results (from the past 240 hour(s))  SARS Coronavirus 2 by RT PCR (hospital order, performed in [facility] hospital lab) Nasopharyngeal  Nasopharyngeal Swab     Status: None   Collection Time: 08/27/19  8:48 AM   Specimen: Nasopharyngeal Swab  Result Value Ref Range Status   SARS Coronavirus 2 NEGATIVE NEGATIVE Final    Comment: (NOTE) SARS-CoV-2 target nucleic acids are NOT DETECTED.  The SARS-CoV-2 RNA is generally detectable in upper and lower respiratory specimens during the acute phase of infection. The lowest concentration of SARS-CoV-2 viral copies this assay can detect is 250 copies / mL. A negative result does not preclude SARS-CoV-2 infection and should not be used as the sole basis for treatment or other patient management decisions.  A negative result may occur with improper specimen collection / handling, submission of specimen other than nasopharyngeal swab, presence of viral mutation(s) within the areas targeted by this assay, and inadequate number of viral copies (<250 copies / mL). A negative result must be combined with clinical observations, patient history, and epidemiological information.  Fact Sheet for Patients:   StrictlyIdeas.no  Fact Sheet for Healthcare Providers: BankingDealers.co.za  This test is  not yet approved or  cleared by the [place] and has been authorized for detection and/or diagnosis of SARS-CoV-2 by FDA under an Emergency Use Authorization (EUA).  This EUA will remain in effect (meaning this test can be used) for the duration of the COVID-19 declaration under Section 564(b)(1) of the Act, 21 U.S.C. section 360bbb-3(b)(1), unless the authorization is terminated or revoked sooner.  Performed at [facility], [place] [address], [place], [place] [address]       Studies: No results found.  Scheduled Meds: . acetaminophen  1,000 mg Oral Q8H  . amLODipine  5 mg Oral Daily  . budesonide (PULMICORT) nebulizer solution  0.5 mg Nebulization BID  . dextromethorphan-guaiFENesin  1 tablet Oral BID  .  enoxaparin (LOVENOX) injection  40 mg Subcutaneous Q24H  . ipratropium-albuterol  3 mL Nebulization TID  . lamoTRIgine  200 mg Oral QHS  . loratadine  10 mg Oral Daily  . methylphenidate  18 mg Oral Daily  . methylPREDNISolone (SOLU-MEDROL) injection  60 mg Intravenous Q12H  . montelukast  10 mg Oral QHS  . pantoprazole  40 mg Oral Q0600  . QUEtiapine  100 mg Oral QHS  . venlafaxine XR  150 mg Oral QHS    Continuous Infusions:   LOS: 4 days     Alma Friendly, MD [facility]  If 7PM-7AM, please contact night-coverage [email or web address] 09/01/2019, 1:12 PM

## 2019-09-01 NOTE — Plan of Care (Signed)

## 2019-09-01 NOTE — Telephone Encounter (Signed)
Patient is current admitted to hospital  Let her know Message from  [place].  Patient voiced understanding Nothing further needed at this time.

## 2019-09-01 NOTE — Progress Notes (Signed)
Occupational Therapy Progress Note  Patient requesting to use bathroom. Supervision for log roll technique OOB, supervision for sit to stand at EOB with walker and ambulating into bathroom. Patient set up A for peri care after bowel movement, min cues for safety with hand placement with transfer off toilet. Mod cues during ADLs to implement pursed lip breathing techniques. Patient supervision to ambulate with walker to recliner, min cues to reach back when sitting for eccentric control. Patient reading between 88-91% on 2L once seated, difficulty sustaining good wave form. Patient reports she has read energy conservation hand out multiple times and has implemented some strategies at home already. Will continue to follow.    09/01/19 1400  OT Visit Information  Last OT Received On 09/01/19  Assistance Needed +1  History of Present Illness Julia Daniels is a 69 y.o. female with history of asthma/COPD on as needed O2, anxiety, bipolar disorder, depression and ADD admitted for COPD exacerbation.  Precautions  Precaution Comments Thoracic Back pain, monitor O2 sats  Pain Assessment  Pain Assessment Faces  Faces Pain Scale 4  Pain Location Upper back - with movement  Pain Descriptors / Indicators Grimacing;Guarding;Sharp  Pain Intervention(s) Monitored during session  Cognition  Arousal/Alertness Awake/alert  Behavior During Therapy WFL for tasks assessed/performed  Overall Cognitive Status Within Functional Limits for tasks assessed  ADL  Overall ADL's  Needs assistance/impaired  Grooming Wash/dry hands;Set up;Sitting  Toilet Transfer Supervision/safety;Ambulation;Cueing for safety;RW;Regular Radio producer Details (indicate cue type and reason) min cues for safety to push from surface vs pulling on walker to stand from toilet   Toileting- Clothing Manipulation and Hygiene Set up;Sitting/lateral lean  Functional mobility during ADLs Supervision/safety;Rolling walker  General  ADL Comments cues for pursed lip breathing techniques while performing ADLs   Bed Mobility  Overal bed mobility Needs Assistance  Bed Mobility Rolling;Sidelying to Sit  Rolling Supervision  Sidelying to sit Supervision;HOB elevated  General bed mobility comments increased time and use of bed rails, no physical assist needed to perform log roll   Balance  Overall balance assessment Needs assistance  Sitting-balance support Feet supported  Sitting balance-Leahy Scale Fair  Standing balance support During functional activity;Bilateral upper extremity supported  Standing balance-Leahy Scale Poor  Restrictions  Weight Bearing Restrictions No  Transfers  Overall transfer level Needs assistance  Equipment used Rolling walker (2 wheeled)  Transfers Sit to/from Stand  Sit to Stand Supervision  General transfer comment cues for hand placement when standing from toilet to push from surface   General Comments  General comments (skin integrity, edema, etc.) patient on 2L O2 throughout session, once seated in recliner O2 reading between 88-91% patient reports mild shortness of breath  OT - End of Session  Equipment Utilized During Treatment Rolling walker;Oxygen  Activity Tolerance Patient tolerated treatment well  Patient left in chair;with call bell/phone within reach;with chair alarm set  Nurse Communication Mobility status  OT Assessment/Plan  OT Plan Discharge plan remains appropriate  OT Visit Diagnosis Pain  Pain - part of body  (back)  OT Frequency (ACUTE ONLY) Min 2X/week  Follow Up Recommendations No OT follow up  OT Equipment Tub/shower seat  AM-PAC OT "6 Clicks" Daily Activity Outcome Measure (Version 2)  Help from another person eating meals? 4  Help from another person taking care of personal grooming? 3  Help from another person toileting, which includes using toliet, bedpan, or urinal? 3  Help from another person bathing (including washing, rinsing, drying)? 3  Help from  another person to put on and taking off regular upper body clothing? 3  Help from another person to put on and taking off regular lower body clothing? 3  6 Click Score 19  OT Goal Progression  Progress towards OT goals Progressing toward goals  Acute Rehab OT Goals  Patient Stated Goal To improve endurance/breathing  OT Goal Formulation With patient  Time For Goal Achievement 09/11/19  Potential to Achieve Goals Good  ADL Goals  Pt Will Transfer to Toilet ambulating;regular height toilet;with modified independence  Pt Will Perform Toileting - Clothing Manipulation and hygiene sit to/from stand;with modified independence  Additional ADL Goal #1 Patient will stand at sink to perform grooming task as evidence of improving activity tolerance  Additional ADL Goal #2 [place] will verbalize understanding and demonstration of information of energy conservation principles and pursed lipped breathing technique prior to dc  OT Time Calculation  OT Start Time (ACUTE ONLY) 1316  OT Stop Time (ACUTE ONLY) 1342  OT Time Calculation (min) 26 min  OT General Charges  $OT Visit 1 Visit  OT Treatments  $Self Care/Home Management  23-37 mins   Delbert Phenix OT OT pager: [phone]

## 2019-09-01 NOTE — TOC Initial Note (Addendum)
Transition of Care [facility]) - Initial/Assessment Note    Patient Details  Name: Julia Daniels MRN: [id number] Date of Birth: [birth year]  [facility] of [facility] [place] CM/SW Contact:    Lia Hopping, [place] Phone Number: 09/01/2019, 12:02 PM  Clinical Narrative:                 CSW met with the patient at bedside to discuss home health and DME needs. Patient declines Home Health PT/OT at this time. Patient reports she will not  need physical therapy upon returning home. She uses a RW at home for support. She inquired how to arrange home health if needed at a later time. CSW instructed the patient to call her PCP. Patient report understanding. Patient agreeable to OT recommendation for shower stool/Patient understands her [employer] may or may not cover the cost. Patient agreeable to pay privately if it is not covered. DME referral made to [facility]. Shower stool will be delivered to the patient home per her request.   Expected Discharge Plan: [place] Barriers to Discharge: No Barriers Identified   Patient Goals and CMS Choice Patient states their goals for this hospitalization and ongoing recovery are:: return home CMS Medicare.gov Compare Post Acute Care list provided to:: Patient Choice offered to / list presented to : Patient  Expected Discharge Plan and Services Expected Discharge Plan: [place] Choice: Grandfather arrangements for the past 2 months: Single Family Home                 DME Arranged: Shower stool DME Agency: [employer] Date DME Agency Contacted: 09/01/19 Time DME Agency Contacted: 52 Representative spoke with at DME Agency: Thedore Mins            Prior Living Arrangements/Services Living arrangements for the past 2 months: [place] Lives with:: Self Patient language and need for interpreter reviewed:: No Do you feel safe going back to the place where you live?: Yes       Need for Family Participation in Patient Care: Yes (Comment) Care giver support system in place?: Yes (comment) Current home services: DME Criminal Activity/Legal Involvement Pertinent to Current Situation/Hospitalization: No - Comment as needed  Activities of Daily Living Home Assistive Devices/Equipment: Nebulizer, Contact lenses ADL Screening (condition at time of admission) Patient's cognitive ability adequate to safely complete daily activities?: Yes Is the patient deaf or have difficulty hearing?: No Does the patient have difficulty seeing, even when wearing glasses/contacts?: No Does the patient have difficulty concentrating, remembering, or making decisions?: No Patient able to express need for assistance with ADLs?: Yes Does the patient have difficulty dressing or bathing?: No Independently performs ADLs?: Yes (appropriate for developmental age) Does the patient have difficulty walking or climbing stairs?: Yes (due to shortness of breath) Weakness of Legs: None Weakness of Arms/Hands: None  Permission Sought/Granted Permission sought to share information with : Case Manager Permission granted to share information with : Yes, Verbal Permission Granted              Emotional Assessment Appearance:: Appears stated age Attitude/Demeanor/Rapport: Engaged Affect (typically observed): Accepting Orientation: : Oriented to Self, Oriented to Place, Oriented to  Time, Oriented to Situation Alcohol / Substance Use: Not Applicable Psych Involvement: No (comment)  Admission diagnosis:  COPD exacerbation ([place]) [J44.1] COPD with acute exacerbation ([place]) [J44.1] Patient Active Problem List   Diagnosis Date Noted  . Essential hypertension 07/27/2019  .  Allergic rhinitis 03/14/2018  . Acute respiratory failure with hypoxia ([place]) 03/08/2018  . Elevated MCV 01/31/2018  . Vitamin B12 deficiency 01/31/2018  . Acute on chronic respiratory failure with hypoxia ([place]) 01/31/2018  . Acute  on chronic respiratory failure ([place]) 12/31/2017  . Respiratory failure, acute-on-chronic ([place]) 12/31/2017  . COPD exacerbation ([place]) 09/18/2017  . Hypoxia   . Hyperlipidemia 08/23/2017  . Depression 08/23/2017  . Anxiety 08/23/2017  . Hypercalcemia 08/23/2017  . Thrombocytosis ([place]) 08/23/2017  . COPD with acute exacerbation ([place]) 08/23/2017  . Acute respiratory failure with hypoxia and hypercapnia ([place]) 08/23/2017  . ADD (attention deficit disorder) 08/30/2012  . Bipolar disorder, unspecified ([place]) 08/30/2012  . COPD  GOLD ? III with increased Eos and reversibility ? ACOS ?  08/30/2012  . Tobacco abuse 08/30/2012   PCP:  Simona Huh, NP Pharmacy:   [id number] [place], [place] [address]Phone: [phone]: [phone] [place] [place] [address]Phone: [phone]: [phone] [id number] [address]Phone: [phone]: [phone] [facility] [place] [address]Phone: [phone]: [phone] [facility] [address] [address]Phone: [phone]: [phone] [address]Phone: [phone]: [phone]    Social Determinants of Health (SDOH) Interventions    Readmission Risk Interventions No flowsheet data found.

## 2019-09-02 ENCOUNTER — Telehealth: Payer: Self-pay | Admitting: Internal Medicine

## 2019-09-02 DIAGNOSIS — E669 Obesity, unspecified: Secondary | ICD-10-CM

## 2019-09-02 MED ORDER — DM-GUAIFENESIN ER 30-600 MG PO TB12: 1.0000 | ORAL_TABLET | Freq: Two times a day (BID) | ORAL | 0 refills | Status: DC | PRN

## 2019-09-02 MED ORDER — SPIRIVA RESPIMAT 2.5 MCG/ACT IN AERS: 2.0000 | INHALATION_SPRAY | Freq: Every day | RESPIRATORY_TRACT | 0 refills | Status: DC

## 2019-09-02 MED ORDER — ALBUTEROL SULFATE (2.5 MG/3ML) 0.083% IN NEBU: 2.5000 mg | INHALATION_SOLUTION | Freq: Four times a day (QID) | RESPIRATORY_TRACT | 12 refills | Status: DC | PRN

## 2019-09-02 MED ORDER — PREDNISONE 10 MG PO TABS: ORAL_TABLET | ORAL | 0 refills | Status: DC

## 2019-09-02 NOTE — Telephone Encounter (Signed)
Sample of Spiriva 2.5mg  left for patient, she is waiting on mail delivery. Patient's son will pick up from our office.Marland Kitchen

## 2019-09-02 NOTE — Discharge Summary (Signed)
Discharge Summary  Julia Daniels:[id number] DOB: 1950-11-03  PCP: Simona Huh, NP  Admit date: 08/27/2019 Discharge date: 09/02/2019  Time spent: 40 mins  Recommendations for Outpatient Follow-up:  1. PCP in 1 week 2. Pulmonary as scheduled  Discharge Diagnoses:  Active Hospital Problems   Diagnosis Date Noted  . COPD exacerbation ([place]) 09/18/2017  . COPD with acute exacerbation ([place]) 08/23/2017    Resolved Hospital Problems  No resolved problems to display.    Discharge Condition: Stable  Diet recommendation: Heart healthy  Vitals:   09/02/19 0610 09/02/19 0819  BP: 117/85   Pulse: 81   Resp: 18   Temp: 98.2 F (36.8 C)   SpO2: 98% 94%    History of present illness:  Julia Daniels a 69 y.o.femalewith history ofasthma/COPD on as needed O2, anxiety, bipolar disorder, depressionandADD presenting with the above chief complaints. Patient reports worsening SOB for the last 3 to 4 days, dyspnea with minimal exertion, with productive cough with whitish phlegm. She is fully vaccinated against COVID-19. Denies known exposure to COVID-19 or sick contact. Reports good compliance with her breathing treatments. Son called her pulmonologist out of concern about shortness of breath,wheezing and some mental status change,and was advised to call EMS. Patient received IV Solu-Medrol and nebulizers in route to ED. Lives with her son. Denies smoking cigarettes, drinking alcohol or recreational drug use. In ED, hemodynamically stable. Saturating low 90s on 4 L. Glucose 134. WBC 19. MCV 101.5. Otherwise CBC and BMP without significant finding. CXR without significant finding. COVID-19 PCR negative. Hospitalist service consulted for admission for COPD exacerbation.    Today, patient reports improvement in her breathing, very minimal wheezing bilaterally, cough is improving. Patient denied any chest pain, abdominal pain, nausea/vomiting, fever/chills.  Discussed extensively with patient about plan of care, follow-up appointments and the need to be as active as tolerated. Patient very eager to be discharged. Refusing home PT/OT (stating she usually works at night and sleeps during the day).    Hospital Course:  Active Problems:   COPD with acute exacerbation (HCC)   COPD exacerbation (HCC)   Acute COPD exacerbation Acute on chronic hypoxic respiratory failure Currently requiring about 2 L of O2, saturating in the 90s Afebrile, with leukocytosis (steroids) Chest x-ray without any acute finding Continue tapered steroid, completed doxycycline Continue home nebulizers and inhalers Supplemental oxygen Follow-up with pulmonology as scheduled, PCP in 1 week  Prediabetes A1c 5.7 Outpatient follow-up  Elevated BP As per patient, no prior history of hypertension, not on any medication Possible due to anxiety, COPD exacerbation, pain Follow up with PCP  Anxiety/depression/bipolar/ADD Continue Effexor, quetiapine, methylphenidate, Lamictal, Xanax as needed  GERD Continue PPI  Obesity Lifestyle modification advised        Malnutrition Type:      Malnutrition Characteristics:      Nutrition Interventions:      Estimated body mass index is 34.72 kg/m as calculated from the following:   Height as of this encounter: 5\' 3"  (1.6 m).   Weight as of this encounter: 88.9 kg.    Procedures:  None  Consultations:  None  Discharge Exam: BP 117/85   Pulse 81   Temp 98.2 F (36.8 C) (Oral)   Resp 18   Ht 5\' 3"  (1.6 m)   Wt 88.9 kg   SpO2 94%   BMI 34.72 kg/m   General: NAD Cardiovascular: S1, S2 present Respiratory: Very mild bilateral wheezing noted, diminished breath sounds bilaterally   Discharge Instructions You  were cared for by a hospitalist during your hospital stay. If you have any questions about your discharge medications or the care you received while you were in the hospital after you  are discharged, you can call the unit and asked to speak with the hospitalist on call if the hospitalist that took care of you is not available. Once you are discharged, your primary care physician will handle any further medical issues. Please note that NO REFILLS for any discharge medications will be authorized once you are discharged, as it is imperative that you return to your primary care physician (or establish a relationship with a primary care physician if you do not have one) for your aftercare needs so that they can reassess your need for medications and monitor your lab values.  Discharge Instructions    Diet - low sodium heart healthy   Complete by: As directed    Increase activity slowly   Complete by: As directed      Allergies as of 09/02/2019      Reactions   Sulfa Antibiotics Other (See Comments)   Mouth gets raw      Medication List    TAKE these medications   AeroChamber MV inhaler Use as instructed   albuterol 108 (90 Base) MCG/ACT inhaler Commonly known as: ProAir HFA Inhale 2 puffs into the lungs every 6 (six) hours as needed for wheezing or shortness of breath.   albuterol (2.5 MG/3ML) 0.083% nebulizer solution Commonly known as: PROVENTIL Take 3 mLs (2.5 mg total) by nebulization every 6 (six) hours as needed for wheezing or shortness of breath.   ALPRAZolam 0.25 MG tablet Commonly known as: XANAX Take 0.25 mg by mouth daily as needed for anxiety.   Baclofen 5 MG Tabs Take 1 tablet by mouth 3 (three) times daily as needed for muscle spasms.   bismuth subsalicylate 277 MG chewable tablet Commonly known as: PEPTO BISMOL Chew 262-524 mg by mouth daily as needed for diarrhea or loose stools.   budesonide-formoterol 160-4.5 MCG/ACT inhaler Commonly known as: Symbicort INHALE 2 PUFFS INTO LUNGS TWICE A DAY What changed:   how much to take  how to take this  when to take this  additional instructions   dextromethorphan-guaiFENesin 30-600 MG 12hr  tablet Commonly known as: MUCINEX DM Take 1 tablet by mouth 2 (two) times daily as needed for up to 7 days for cough.   EPINEPHrine 0.3 mg/0.3 mL Soaj injection Commonly known as: EPI-PEN Inject 0.3 mLs (0.3 mg total) into the muscle as needed for anaphylaxis.   famotidine 20 MG tablet Commonly known as: PEPCID One at bedtime What changed:   how much to take  how to take this  when to take this  additional instructions   Fasenra 30 MG/ML Sosy Generic drug: Benralizumab INJECT 1 SYRINGE UNDER THE SKIN EVERY 8 WEEKS. What changed: See the new instructions.   ibuprofen 800 MG tablet Commonly known as: ADVIL Take 800 mg by mouth 2 (two) times daily as needed for pain.   lamoTRIgine 200 MG tablet Commonly known as: LAMICTAL Take 200 mg by mouth at bedtime.   loratadine 10 MG tablet Commonly known as: CLARITIN Take 10 mg by mouth daily.   methylphenidate 54 MG CR tablet Commonly known as: CONCERTA Take 54 mg by mouth every morning. Take with 18mg  to equal 72mg    methylphenidate 18 MG CR tablet Commonly known as: CONCERTA Take 18 mg by mouth daily.   montelukast 10 MG tablet Commonly known  as: SINGULAIR Take 1 tablet (10 mg total) by mouth at bedtime.   pantoprazole 40 MG tablet Commonly known as: Protonix Take 1 tablet (40 mg total) by mouth daily. Take 30-60 min before first meal of the day   predniSONE 10 MG tablet Commonly known as: DELTASONE Take 5 tablets (50 mg total) by mouth daily for 3 days, THEN 4 tablets (40 mg total) daily for 3 days, THEN 3 tablets (30 mg total) daily for 3 days, THEN 2 tablets (20 mg total) daily for 3 days, THEN 1 tablet (10 mg total) daily for 3 days. Start taking on: September 02, 2019 What changed:   See the new instructions.  Another medication with the same name was removed. Continue taking this medication, and follow the directions you see here.   QUEtiapine 100 MG tablet Commonly known as: SEROQUEL Take 150 mg by mouth at  bedtime.   risperiDONE 1 MG tablet Commonly known as: RISPERDAL Take 3 mg by mouth at bedtime.   Spiriva Respimat 2.5 MCG/ACT Aers Generic drug: Tiotropium Bromide Monohydrate Inhale 2 puffs into the lungs daily.   venlafaxine XR 150 MG 24 hr capsule Commonly known as: EFFEXOR-XR Take 150 mg by mouth at bedtime.   Vitamin D (Ergocalciferol) 1.25 MG (50000 UNIT) Caps capsule Commonly known as: DRISDOL Take 50,000 Units by mouth every Saturday.      Allergies  Allergen Reactions  . Sulfa Antibiotics Other (See Comments)    Mouth gets raw    Follow-up Information    Simona Huh, NP. Schedule an appointment as soon as possible for a visit in 1 week(s).   Specialty: Nurse Practitioner Contact information: [place] [address] [phone]                The results of significant diagnostics from this hospitalization (including imaging, microbiology, ancillary and laboratory) are listed below for reference.    Significant Diagnostic Studies: DG Chest Port 1 View  Result Date: 08/27/2019 CLINICAL DATA:  69 year old female with shortness of breath for several days. COPD. EXAM: PORTABLE CHEST 1 VIEW COMPARISON:  Chest radiograph 06/27/2019 and earlier. FINDINGS: Portable AP semi upright view at 0905 hours. Mildly lower lung volumes. Stable mediastinal contours. Stable trachea. Allowing for portable technique basilar predominant increased pulmonary interstitial markings appear not significantly changed. Mild linear atelectasis suspected in the periphery of the left mid lung. No pneumothorax or pleural effusion. Paucity of bowel gas in the upper abdomen. No acute osseous abnormality identified. IMPRESSION: Mild atelectasis.  No other acute cardiopulmonary abnormality. Electronically Signed   By: Genevie Ann M.D.   On: 08/27/2019 09:51    Microbiology: Recent Results (from the past 240 hour(s))  SARS Coronavirus 2 by RT PCR (hospital order, performed in [facility] hospital lab) Nasopharyngeal Nasopharyngeal Swab     Status: None   Collection Time: 08/27/19  8:48 AM   Specimen: Nasopharyngeal Swab  Result Value Ref Range Status   SARS Coronavirus 2 NEGATIVE NEGATIVE Final    Comment: (NOTE) SARS-CoV-2 target nucleic acids are NOT DETECTED.  The SARS-CoV-2 RNA is generally detectable in upper and lower respiratory specimens during the acute phase of infection. The lowest concentration of SARS-CoV-2 viral copies this assay can detect is 250 copies / mL. A negative result does not preclude SARS-CoV-2 infection and should not be used as the sole basis for treatment or other patient management decisions.  A negative result may occur with improper specimen collection / handling, submission of  specimen other than nasopharyngeal swab, presence of viral mutation(s) within the areas targeted by this assay, and inadequate number of viral copies (<250 copies / mL). A negative result must be combined with clinical observations, patient history, and epidemiological information.  Fact Sheet for Patients:   StrictlyIdeas.no  Fact Sheet for Healthcare Providers: BankingDealers.co.za  This test is not yet approved or  cleared by the [place] FDA and has been authorized for detection and/or diagnosis of SARS-CoV-2 by FDA under an Emergency Use Authorization (EUA).  This EUA will remain in effect (meaning this test can be used) for the duration of the COVID-19 declaration under Section 564(b)(1) of the Act, 21 U.S.C. section 360bbb-3(b)(1), unless the authorization is terminated or revoked sooner.  Performed at [facility], [place] [address], [place], [place] [address]      Labs: Basic Metabolic Panel: Recent Labs  Lab 08/27/19 0846 08/29/19 0446  NA 136 136  K 4.5 4.6  CL 99 100  CO2 27 27  GLUCOSE 134* 104*  BUN 18 24*  CREATININE 0.75 0.74  CALCIUM 10.1 9.9   Liver  Function Tests: No results for input(s): AST, ALT, ALKPHOS, BILITOT, PROT, ALBUMIN in the last 168 hours. No results for input(s): LIPASE, AMYLASE in the last 168 hours. No results for input(s): AMMONIA in the last 168 hours. CBC: Recent Labs  Lab 08/27/19 0846 08/29/19 0446 08/30/19 0548  WBC 19.1* 16.7* 15.8*  NEUTROABS  --  13.1* 12.3*  HGB 12.9 13.3 14.2  HCT 40.1 41.1 44.2  MCV 101.5* 101.2* 100.9*  PLT 290 293 312   Cardiac Enzymes: No results for input(s): CKTOTAL, CKMB, CKMBINDEX, TROPONINI in the last 168 hours. BNP: BNP (last 3 results) No results for input(s): BNP in the last 8760 hours.  ProBNP (last 3 results) No results for input(s): PROBNP in the last 8760 hours.  CBG: No results for input(s): GLUCAP in the last 168 hours.     Signed:  Alma Friendly, MD [facility] 09/02/2019, 1:09 PM

## 2019-09-02 NOTE — Progress Notes (Signed)
Discharge instructions discussed with patient, verbalized agreement and understanding 

## 2019-09-05 DIAGNOSIS — M545 Low back pain: Secondary | ICD-10-CM | POA: Diagnosis not present

## 2019-09-05 DIAGNOSIS — Z79899 Other long term (current) drug therapy: Secondary | ICD-10-CM | POA: Diagnosis not present

## 2019-09-05 DIAGNOSIS — Z9181 History of falling: Secondary | ICD-10-CM | POA: Diagnosis not present

## 2019-09-05 DIAGNOSIS — M546 Pain in thoracic spine: Secondary | ICD-10-CM | POA: Diagnosis not present

## 2019-09-05 DIAGNOSIS — Z09 Encounter for follow-up examination after completed treatment for conditions other than malignant neoplasm: Secondary | ICD-10-CM | POA: Diagnosis not present

## 2019-09-08 ENCOUNTER — Encounter (HOSPITAL_COMMUNITY): Payer: Self-pay | Admitting: Emergency Medicine

## 2019-09-08 ENCOUNTER — Inpatient Hospital Stay (HOSPITAL_COMMUNITY)
Admission: EM | Admit: 2019-09-08 | Discharge: 2019-09-12 | DRG: 190 | Disposition: A | Discharge status: Rehabilitation Facility | Payer: BC Managed Care – PPO | Attending: Internal Medicine | Admitting: Internal Medicine

## 2019-09-08 ENCOUNTER — Emergency Department (HOSPITAL_COMMUNITY): Payer: BC Managed Care – PPO

## 2019-09-08 ENCOUNTER — Other Ambulatory Visit: Payer: Self-pay

## 2019-09-08 DIAGNOSIS — Z87891 Personal history of nicotine dependence: Secondary | ICD-10-CM

## 2019-09-08 DIAGNOSIS — R0602 Shortness of breath: Secondary | ICD-10-CM | POA: Diagnosis not present

## 2019-09-08 DIAGNOSIS — Z79899 Other long term (current) drug therapy: Secondary | ICD-10-CM | POA: Diagnosis not present

## 2019-09-08 DIAGNOSIS — F419 Anxiety disorder, unspecified: Secondary | ICD-10-CM | POA: Diagnosis present

## 2019-09-08 DIAGNOSIS — M5127 Other intervertebral disc displacement, lumbosacral region: Secondary | ICD-10-CM | POA: Diagnosis not present

## 2019-09-08 DIAGNOSIS — M4854XA Collapsed vertebra, not elsewhere classified, thoracic region, initial encounter for fracture: Secondary | ICD-10-CM | POA: Diagnosis not present

## 2019-09-08 DIAGNOSIS — Z7951 Long term (current) use of inhaled steroids: Secondary | ICD-10-CM | POA: Diagnosis not present

## 2019-09-08 DIAGNOSIS — Z6834 Body mass index (BMI) 34.0-34.9, adult: Secondary | ICD-10-CM

## 2019-09-08 DIAGNOSIS — K5903 Drug induced constipation: Secondary | ICD-10-CM | POA: Diagnosis not present

## 2019-09-08 DIAGNOSIS — R609 Edema, unspecified: Secondary | ICD-10-CM | POA: Diagnosis not present

## 2019-09-08 DIAGNOSIS — J449 Chronic obstructive pulmonary disease, unspecified: Secondary | ICD-10-CM | POA: Diagnosis not present

## 2019-09-08 DIAGNOSIS — Z882 Allergy status to sulfonamides status: Secondary | ICD-10-CM | POA: Diagnosis not present

## 2019-09-08 DIAGNOSIS — E785 Hyperlipidemia, unspecified: Secondary | ICD-10-CM | POA: Diagnosis not present

## 2019-09-08 DIAGNOSIS — I10 Essential (primary) hypertension: Secondary | ICD-10-CM | POA: Diagnosis present

## 2019-09-08 DIAGNOSIS — R0902 Hypoxemia: Secondary | ICD-10-CM | POA: Diagnosis not present

## 2019-09-08 DIAGNOSIS — J9811 Atelectasis: Secondary | ICD-10-CM | POA: Diagnosis not present

## 2019-09-08 DIAGNOSIS — M47816 Spondylosis without myelopathy or radiculopathy, lumbar region: Secondary | ICD-10-CM | POA: Diagnosis not present

## 2019-09-08 DIAGNOSIS — J9601 Acute respiratory failure with hypoxia: Secondary | ICD-10-CM | POA: Diagnosis not present

## 2019-09-08 DIAGNOSIS — M5126 Other intervertebral disc displacement, lumbar region: Secondary | ICD-10-CM | POA: Diagnosis not present

## 2019-09-08 DIAGNOSIS — Z20822 Contact with and (suspected) exposure to covid-19: Secondary | ICD-10-CM | POA: Diagnosis not present

## 2019-09-08 DIAGNOSIS — I2510 Atherosclerotic heart disease of native coronary artery without angina pectoris: Secondary | ICD-10-CM | POA: Diagnosis present

## 2019-09-08 DIAGNOSIS — R0600 Dyspnea, unspecified: Secondary | ICD-10-CM | POA: Diagnosis not present

## 2019-09-08 DIAGNOSIS — E669 Obesity, unspecified: Secondary | ICD-10-CM | POA: Diagnosis not present

## 2019-09-08 DIAGNOSIS — D72829 Elevated white blood cell count, unspecified: Secondary | ICD-10-CM | POA: Diagnosis not present

## 2019-09-08 DIAGNOSIS — J441 Chronic obstructive pulmonary disease with (acute) exacerbation: Secondary | ICD-10-CM | POA: Diagnosis not present

## 2019-09-08 DIAGNOSIS — Z825 Family history of asthma and other chronic lower respiratory diseases: Secondary | ICD-10-CM | POA: Diagnosis not present

## 2019-09-08 DIAGNOSIS — Z801 Family history of malignant neoplasm of trachea, bronchus and lung: Secondary | ICD-10-CM | POA: Diagnosis not present

## 2019-09-08 DIAGNOSIS — J9621 Acute and chronic respiratory failure with hypoxia: Secondary | ICD-10-CM | POA: Diagnosis present

## 2019-09-08 DIAGNOSIS — T380X5A Adverse effect of glucocorticoids and synthetic analogues, initial encounter: Secondary | ICD-10-CM | POA: Diagnosis not present

## 2019-09-08 DIAGNOSIS — N281 Cyst of kidney, acquired: Secondary | ICD-10-CM | POA: Diagnosis not present

## 2019-09-08 DIAGNOSIS — M48061 Spinal stenosis, lumbar region without neurogenic claudication: Secondary | ICD-10-CM | POA: Diagnosis not present

## 2019-09-08 DIAGNOSIS — M549 Dorsalgia, unspecified: Secondary | ICD-10-CM | POA: Diagnosis present

## 2019-09-08 DIAGNOSIS — F329 Major depressive disorder, single episode, unspecified: Secondary | ICD-10-CM | POA: Diagnosis present

## 2019-09-08 DIAGNOSIS — M62838 Other muscle spasm: Secondary | ICD-10-CM | POA: Diagnosis not present

## 2019-09-08 DIAGNOSIS — M47817 Spondylosis without myelopathy or radiculopathy, lumbosacral region: Secondary | ICD-10-CM | POA: Diagnosis not present

## 2019-09-08 DIAGNOSIS — Z90710 Acquired absence of both cervix and uterus: Secondary | ICD-10-CM | POA: Diagnosis not present

## 2019-09-08 DIAGNOSIS — R000 Tachycardia, unspecified: Secondary | ICD-10-CM | POA: Diagnosis not present

## 2019-09-08 DIAGNOSIS — Z720 Tobacco use: Secondary | ICD-10-CM | POA: Diagnosis present

## 2019-09-08 DIAGNOSIS — M5489 Other dorsalgia: Secondary | ICD-10-CM | POA: Diagnosis not present

## 2019-09-08 DIAGNOSIS — M4850XA Collapsed vertebra, not elsewhere classified, site unspecified, initial encounter for fracture: Secondary | ICD-10-CM

## 2019-09-08 DIAGNOSIS — S22050A Wedge compression fracture of T5-T6 vertebra, initial encounter for closed fracture: Secondary | ICD-10-CM | POA: Diagnosis not present

## 2019-09-08 DIAGNOSIS — R5381 Other malaise: Secondary | ICD-10-CM | POA: Diagnosis not present

## 2019-09-08 LAB — COMPREHENSIVE METABOLIC PANEL
ALT: 50 U/L — ABNORMAL HIGH (ref 0–44)
AST: 22 U/L (ref 15–41)
Albumin: 3.7 g/dL (ref 3.5–5.0)
Alkaline Phosphatase: 57 U/L (ref 38–126)
Anion gap: 8 (ref 5–15)
BUN: 19 mg/dL (ref 8–23)
CO2: 29 mmol/L (ref 22–32)
Calcium: 9.5 mg/dL (ref 8.9–10.3)
Chloride: 101 mmol/L (ref 98–111)
Creatinine, Ser: 0.67 mg/dL (ref 0.44–1.00)
GFR calc Af Amer: >60 mL/min (ref >60)
GFR calc non Af Amer: >60 mL/min (ref >60)
Glucose, Bld: 135 mg/dL — ABNORMAL HIGH (ref 70–99)
Potassium: 4.4 mmol/L (ref 3.5–5.1)
Sodium: 138 mmol/L (ref 135–145)
Total Bilirubin: 0.3 mg/dL (ref 0.3–1.2)
Total Protein: 6.5 g/dL (ref 6.5–8.1)

## 2019-09-08 LAB — CBC
HCT: 41.7 % (ref 36.0–46.0)
Hemoglobin: 13.2 g/dL (ref 12.0–15.0)
MCH: 32.2 pg (ref 26.0–34.0)
MCHC: 31.7 g/dL (ref 30.0–36.0)
MCV: 101.7 fL — ABNORMAL HIGH (ref 80.0–100.0)
Platelets: 267 10*3/uL (ref 150–400)
RBC: 4.1 MIL/uL (ref 3.87–5.11)
RDW: 14.6 % (ref 11.5–15.5)
WBC: 16.8 10*3/uL — ABNORMAL HIGH (ref 4.0–10.5)
nRBC: 0 % (ref 0.0–0.2)

## 2019-09-08 LAB — BLOOD GAS, VENOUS
Acid-Base Excess: 6.4 mmol/L — ABNORMAL HIGH (ref 0.0–2.0)
Bicarbonate: 31.6 mmol/L — ABNORMAL HIGH (ref 20.0–28.0)
O2 Saturation: 89.4 %
Patient temperature: 98.6
pCO2, Ven: 49.1 mmHg (ref 44.0–60.0)
pH, Ven: 7.424 (ref 7.250–7.430)
pO2, Ven: 54.1 mmHg — ABNORMAL HIGH (ref 32.0–45.0)

## 2019-09-08 LAB — SARS CORONAVIRUS 2 BY RT PCR (HOSPITAL ORDER, PERFORMED IN ~~LOC~~ HOSPITAL LAB): SARS Coronavirus 2: NEGATIVE

## 2019-09-08 LAB — TROPONIN I (HIGH SENSITIVITY)
Troponin I (High Sensitivity): 7 ng/L (ref <18)
Troponin I (High Sensitivity): 7 ng/L (ref <18)

## 2019-09-08 LAB — D-DIMER, QUANTITATIVE: D-Dimer, Quant: 0.71 ug/mL-FEU — ABNORMAL HIGH (ref 0.00–0.50)

## 2019-09-08 MED ORDER — MONTELUKAST SODIUM 10 MG PO TABS
10.0000 mg | ORAL_TABLET | Freq: Every day | ORAL | Status: DC
  Administered 2019-09-08 – 2019-09-11 (×4): 10 mg via ORAL
  Filled 2019-09-08 (×4): qty 1

## 2019-09-08 MED ORDER — METHYLPHENIDATE HCL ER (OSM) 18 MG PO TBCR: 18.0000 mg | EXTENDED_RELEASE_TABLET | Freq: Every day | ORAL | Status: DC

## 2019-09-08 MED ORDER — PANTOPRAZOLE SODIUM 40 MG PO TBEC
40.0000 mg | DELAYED_RELEASE_TABLET | Freq: Every day | ORAL | Status: DC
  Administered 2019-09-08 – 2019-09-12 (×5): 40 mg via ORAL
  Filled 2019-09-08 (×5): qty 1

## 2019-09-08 MED ORDER — PREDNISONE 20 MG PO TABS
40.0000 mg | ORAL_TABLET | Freq: Every day | ORAL | Status: DC
  Administered 2019-09-10 – 2019-09-12 (×3): 40 mg via ORAL
  Filled 2019-09-08 (×3): qty 2

## 2019-09-08 MED ORDER — METHYLPHENIDATE HCL ER (OSM) 27 MG PO TBCR: 54.0000 mg | EXTENDED_RELEASE_TABLET | ORAL | Status: DC

## 2019-09-08 MED ORDER — ALBUTEROL SULFATE (2.5 MG/3ML) 0.083% IN NEBU: 2.5000 mg | INHALATION_SOLUTION | RESPIRATORY_TRACT | Status: DC | PRN

## 2019-09-08 MED ORDER — HYDROMORPHONE HCL 1 MG/ML IJ SOLN
0.5000 mg | Freq: Once | INTRAMUSCULAR | Status: AC
  Administered 2019-09-08: 0.5 mg via INTRAVENOUS
  Filled 2019-09-08: qty 1

## 2019-09-08 MED ORDER — HYDROMORPHONE HCL 1 MG/ML IJ SOLN
1.0000 mg | Freq: Once | INTRAMUSCULAR | Status: AC
  Administered 2019-09-08: 1 mg via INTRAVENOUS
  Filled 2019-09-08: qty 1

## 2019-09-08 MED ORDER — BACLOFEN 10 MG PO TABS
10.0000 mg | ORAL_TABLET | Freq: Three times a day (TID) | ORAL | Status: DC
  Administered 2019-09-08 – 2019-09-12 (×12): 10 mg via ORAL
  Filled 2019-09-08 (×12): qty 1

## 2019-09-08 MED ORDER — DM-GUAIFENESIN ER 30-600 MG PO TB12
1.0000 | ORAL_TABLET | Freq: Two times a day (BID) | ORAL | Status: DC | PRN
  Administered 2019-09-11: 1 via ORAL
  Filled 2019-09-08: qty 1

## 2019-09-08 MED ORDER — HYDROCODONE-ACETAMINOPHEN 5-325 MG PO TABS
1.0000 | ORAL_TABLET | ORAL | Status: DC | PRN
  Administered 2019-09-09 – 2019-09-12 (×4): 1 via ORAL
  Filled 2019-09-08 (×6): qty 1

## 2019-09-08 MED ORDER — LORATADINE 10 MG PO TABS
10.0000 mg | ORAL_TABLET | Freq: Every evening | ORAL | Status: DC
  Administered 2019-09-08 – 2019-09-11 (×4): 10 mg via ORAL
  Filled 2019-09-08 (×4): qty 1

## 2019-09-08 MED ORDER — TIOTROPIUM BROMIDE MONOHYDRATE 2.5 MCG/ACT IN AERS: 2.0000 | INHALATION_SPRAY | Freq: Every day | RESPIRATORY_TRACT | Status: DC

## 2019-09-08 MED ORDER — MOMETASONE FURO-FORMOTEROL FUM 200-5 MCG/ACT IN AERO
2.0000 | INHALATION_SPRAY | Freq: Two times a day (BID) | RESPIRATORY_TRACT | Status: DC
  Administered 2019-09-09 – 2019-09-12 (×7): 2 via RESPIRATORY_TRACT
  Filled 2019-09-08: qty 8.8

## 2019-09-08 MED ORDER — VITAMIN D (ERGOCALCIFEROL) 1.25 MG (50000 UNIT) PO CAPS: 50000.0000 [IU] | ORAL_CAPSULE | ORAL | Status: DC

## 2019-09-08 MED ORDER — ALPRAZOLAM 0.25 MG PO TABS
0.2500 mg | ORAL_TABLET | Freq: Every day | ORAL | Status: DC | PRN
  Administered 2019-09-09: 0.25 mg via ORAL
  Filled 2019-09-08: qty 1

## 2019-09-08 MED ORDER — BENRALIZUMAB 30 MG/ML ~~LOC~~ SOSY: 30.0000 mg | PREFILLED_SYRINGE | SUBCUTANEOUS | Status: DC

## 2019-09-08 MED ORDER — UMECLIDINIUM BROMIDE 62.5 MCG/INH IN AEPB
1.0000 | INHALATION_SPRAY | Freq: Every day | RESPIRATORY_TRACT | Status: DC
  Administered 2019-09-09 – 2019-09-10 (×2): 1 via RESPIRATORY_TRACT
  Filled 2019-09-08: qty 7

## 2019-09-08 MED ORDER — ENOXAPARIN SODIUM 40 MG/0.4ML ~~LOC~~ SOLN
40.0000 mg | SUBCUTANEOUS | Status: DC
  Administered 2019-09-08 – 2019-09-11 (×4): 40 mg via SUBCUTANEOUS
  Filled 2019-09-08 (×4): qty 0.4

## 2019-09-08 MED ORDER — LAMOTRIGINE 100 MG PO TABS
200.0000 mg | ORAL_TABLET | Freq: Every day | ORAL | Status: DC
  Administered 2019-09-08 – 2019-09-11 (×4): 200 mg via ORAL
  Filled 2019-09-08 (×4): qty 2

## 2019-09-08 MED ORDER — RISPERIDONE 1 MG PO TABS
3.0000 mg | ORAL_TABLET | Freq: Every day | ORAL | Status: DC
  Administered 2019-09-08 – 2019-09-11 (×4): 3 mg via ORAL
  Filled 2019-09-08 (×2): qty 3
  Filled 2019-09-08: qty 1
  Filled 2019-09-08: qty 3

## 2019-09-08 MED ORDER — METHYLPREDNISOLONE SODIUM SUCC 125 MG IJ SOLR
125.0000 mg | Freq: Four times a day (QID) | INTRAMUSCULAR | Status: AC
  Administered 2019-09-08 – 2019-09-09 (×4): 125 mg via INTRAVENOUS
  Filled 2019-09-08 (×4): qty 2

## 2019-09-08 MED ORDER — SODIUM CHLORIDE 0.9 % IV SOLN: INTRAVENOUS | Status: DC

## 2019-09-08 MED ORDER — ALBUTEROL SULFATE HFA 108 (90 BASE) MCG/ACT IN AERS: 2.0000 | INHALATION_SPRAY | Freq: Four times a day (QID) | RESPIRATORY_TRACT | Status: DC | PRN

## 2019-09-08 MED ORDER — IPRATROPIUM-ALBUTEROL 0.5-2.5 (3) MG/3ML IN SOLN
3.0000 mL | Freq: Four times a day (QID) | RESPIRATORY_TRACT | Status: DC
  Administered 2019-09-08 – 2019-09-10 (×8): 3 mL via RESPIRATORY_TRACT
  Filled 2019-09-08 (×9): qty 3

## 2019-09-08 MED ORDER — BISMUTH SUBSALICYLATE 262 MG PO CHEW
262.0000 mg | CHEWABLE_TABLET | Freq: Every day | ORAL | Status: DC | PRN
  Filled 2019-09-08: qty 2

## 2019-09-08 MED ORDER — FAMOTIDINE 20 MG PO TABS
20.0000 mg | ORAL_TABLET | Freq: Every day | ORAL | Status: DC
  Administered 2019-09-08 – 2019-09-11 (×4): 20 mg via ORAL
  Filled 2019-09-08 (×4): qty 1

## 2019-09-08 MED ORDER — VENLAFAXINE HCL ER 150 MG PO CP24
150.0000 mg | ORAL_CAPSULE | Freq: Every day | ORAL | Status: DC
  Administered 2019-09-08 – 2019-09-11 (×4): 150 mg via ORAL
  Filled 2019-09-08 (×3): qty 1
  Filled 2019-09-08: qty 2

## 2019-09-08 MED ORDER — SODIUM CHLORIDE 0.9 % IV SOLN
500.0000 mg | Freq: Once | INTRAVENOUS | Status: AC
  Administered 2019-09-08: 500 mg via INTRAVENOUS
  Filled 2019-09-08: qty 500

## 2019-09-08 MED ORDER — IOHEXOL 350 MG/ML SOLN
100.0000 mL | Freq: Once | INTRAVENOUS | Status: AC | PRN
  Administered 2019-09-08: 100 mL via INTRAVENOUS

## 2019-09-08 MED ORDER — MORPHINE SULFATE (PF) 2 MG/ML IV SOLN
2.0000 mg | INTRAVENOUS | Status: DC | PRN
  Administered 2019-09-08 – 2019-09-12 (×14): 2 mg via INTRAVENOUS
  Filled 2019-09-08 (×15): qty 1

## 2019-09-08 MED ORDER — ALBUTEROL SULFATE (2.5 MG/3ML) 0.083% IN NEBU: 2.5000 mg | INHALATION_SOLUTION | Freq: Four times a day (QID) | RESPIRATORY_TRACT | Status: DC | PRN

## 2019-09-08 MED ORDER — SODIUM CHLORIDE (PF) 0.9 % IJ SOLN
INTRAMUSCULAR | Status: AC
  Filled 2019-09-08: qty 50

## 2019-09-08 MED ORDER — DOXYCYCLINE HYCLATE 100 MG PO TABS
100.0000 mg | ORAL_TABLET | Freq: Two times a day (BID) | ORAL | Status: DC
  Administered 2019-09-08 – 2019-09-12 (×8): 100 mg via ORAL
  Filled 2019-09-08 (×8): qty 1

## 2019-09-08 MED ORDER — QUETIAPINE FUMARATE 50 MG PO TABS
150.0000 mg | ORAL_TABLET | Freq: Every day | ORAL | Status: DC
  Administered 2019-09-08 – 2019-09-11 (×4): 150 mg via ORAL
  Filled 2019-09-08 (×4): qty 1

## 2019-09-08 NOTE — ED Triage Notes (Addendum)
Arrives via EMS from home, C/C COPD exacerbation. Called EMS yesterday and they gave her several breathing treatments, did not bring her in, today she is out of her medications. Initial O2 with EMS was 94%. 10 Albuterol, 0.5 Atrovent, 125 mg solumedrol through a 20 G R Hand.   Vitals w/EMS HR 112

## 2019-09-08 NOTE — ED Provider Notes (Signed)
[facility] Emergency Department Provider Note MRN:  [id number]  Arrival date & time: 09/08/19     Chief Complaint   COPD exacerbation   History of Present Illness   Julia Daniels is a 69 y.o. year-old female with a history of COPD presenting to the ED with chief complaint of shortness of breath.  Shortness of breath gradually for the past 2 or 3 days, EMS was called to her house yesterday and they gave her breathing treatments and she stayed home.  Today her breathing worsened again.  She recently ran out of her COPD medications.  Endorsing some chest tightness, endorsing chronic low back pain that she states is due to a known compression fracture.  Denies any numbness or weakness to the arms or legs, no bowel or bladder dysfunction, no headache or vision change.  No abdominal pain.  Review of Systems  A complete 10 system review of systems was obtained and all systems are negative except as noted in the HPI and PMH.   Patient's Health History    Past Medical History:  Diagnosis Date  . Anxiety   . Asthma   . COPD (chronic obstructive pulmonary disease) ([place])   . Depression     Past Surgical History:  Procedure Laterality Date  . ABDOMINAL HYSTERECTOMY     partial     Family History  Problem Relation Age of Onset  . Asthma Mother   . Lung cancer Father   . COPD Father     Social History   Socioeconomic History  . Marital status: Divorced    Spouse name: Not on file  . Number of children: Not on file  . Years of education: Not on file  . Highest education level: Not on file  Occupational History  . Not on file  Tobacco Use  . Smoking status: Former Smoker    Packs/day: 1.00    Years: 40.00    Pack years: 40.00    Types: Cigarettes    Quit date: 08/23/2017    Years since quitting: 2.0  . Smokeless tobacco: Never Used  Substance and Sexual Activity  . Alcohol use: No  . Drug use: No  . Sexual activity: Not Currently  Other  Topics Concern  . Not on file  Social History Narrative  . Not on file   Social Determinants of Health   Financial Resource Strain:   . Difficulty of Paying Living Expenses: Not on file  Food Insecurity:   . Worried About Charity fundraiser in the Last Year: Not on file  . Ran Out of Food in the Last Year: Not on file  Transportation Needs:   . Lack of Transportation (Medical): Not on file  . Lack of Transportation (Non-Medical): Not on file  Physical Activity:   . Days of Exercise per Week: Not on file  . Minutes of Exercise per Session: Not on file  Stress:   . Feeling of Stress : Not on file  Social Connections:   . Frequency of Communication with Friends and Family: Not on file  . Frequency of Social Gatherings with Friends and Family: Not on file  . Attends Religious Services: Not on file  . Active Member of Clubs or Organizations: Not on file  . Attends Archivist Meetings: Not on file  . Marital Status: Not on file  Intimate Partner Violence:   . Fear of Current or Ex-Partner: Not on file  . Emotionally Abused: Not on file  .  Physically Abused: Not on file  . Sexually Abused: Not on file     Physical Exam   Vitals:   09/08/19 1550  BP: (!) 152/90  Pulse: (!) 114  Resp: (!) 22  Temp: 98.6 F (37 C)  SpO2: (!) 88%    CONSTITUTIONAL: Chronically ill-appearing, in moderate respiratory distress NEURO:  Alert and oriented x 3, no focal deficits EYES:  eyes equal and reactive ENT/NECK:  no LAD, no JVD CARDIO: Tachycardic rate, well-perfused, normal S1 and S2 PULM: Tachypneic pursed lip breathing, poor air movement GI/GU:  normal bowel sounds, non-distended, non-tender MSK/SPINE:  No gross deformities, no edema SKIN:  no rash, atraumatic PSYCH:  Appropriate speech and behavior  *Additional and/or pertinent findings included in MDM below  Diagnostic and Interventional Summary    EKG Interpretation  Date/Time:  09/08/2019 at 15: 57: 37 Ventricular  Rate:  113 PR Interval:  137 QRS Duration: 103 QT Interval:  323 QTC Calculation: 443   R Axis:     Text Interpretation: Sinus rhythm, mild tachycardia, no ischemic changes Confirm by Dr. Gerlene Fee at 4:09 PM      Labs Reviewed  SARS CORONAVIRUS 2 BY RT PCR (HOSPITAL ORDER, [facility])  D-DIMER, QUANTITATIVE (NOT AT [facility])  CBC  COMPREHENSIVE METABOLIC PANEL  BLOOD GAS, VENOUS  TROPONIN I (HIGH SENSITIVITY)    DG Chest Port 1 View    (Results Pending)    Medications  HYDROmorphone (DILAUDID) injection 0.5 mg (has no administration in time range)     Procedures  /  Critical Care .Critical Care Performed by: Maudie Flakes, MD Authorized by: Maudie Flakes, MD   Critical care provider statement:    Critical care time (minutes):  45   Critical care was necessary to treat or prevent imminent or life-threatening deterioration of the following conditions: Hypoxic respiratory failure.   Critical care was time spent personally by me on the following activities:  Discussions with consultants, evaluation of patient's response to treatment, examination of patient, ordering and performing treatments and interventions, ordering and review of laboratory studies, ordering and review of radiographic studies, pulse oximetry, re-evaluation of patient's condition, obtaining history from patient or surrogate and review of old charts    ED Course and Medical Decision Making  I have reviewed the triage vital signs, the nursing notes, and pertinent available records from the EMR.  Listed above are laboratory and imaging tests that I personally ordered, reviewed, and interpreted and then considered in my medical decision making (see below for details).  Favoring COPD, also considering pulmonary embolism, less likely ACS or pneumothorax.  Patient has already received Solu-Medrol, will provide further beta agonist, monitor closely.  May need BiPAP.  Requiring 4 L nasal  cannula, this would be a new oxygen requirement.  Anticipating admission.     Patient continues to complain of severe low back pain, likely due to known compression fracture but it seems to be making her very uncomfortable and tachypneic.  D-dimer is positive, following up with CT PA as well as CT abdomen with lumbar spine to exclude retroperitoneal bleeding or other more sinister process.  CT imaging is overall reassuring, confirm compression fracture, patient continues to require supplemental oxygen, will admit to hospital service.  Barth Kirks. [place]onia Small, [place] [email or web address]  Final Clinical Impressions(s) / ED Diagnoses     ICD-10-CM   1. Acute respiratory failure with hypoxia (HCC)  J96.01  ED Discharge Orders    None       Discharge Instructions Discussed with and Provided to Patient:   Discharge Instructions   None       Maudie Flakes, MD 09/08/19 2037

## 2019-09-08 NOTE — H&P (Signed)
History and Physical   Julia Daniels WEX:[id number] DOB: 1950/04/13 DOA: 09/08/2019  Referring MD/NP/PA: Dr. Melina Copa  PCP: Simona Huh, NP   Outpatient Specialists: None  Patient coming from: Home  Chief Complaint: Shortness of breath  HPI: Julia Daniels is a 69 y.o. female with medical history significant of COPD, anxiety disorder and depression who presents to the ER with complaint of shortness of breath.  Symptoms started 2 to 3 days and not getting better.  EMS were called where she received breathing treatment at home but not relieved.  Patient is a frequent flyer with recurrent COPD exacerbation.  She also has degenerative disc disease and tonight complaining of 10 out of 10 lower back pain not relieved by her hydrocodone and ibuprofen.  She was given IV morphine in the ER which appears to help.  The pain was worsening of her shortness of breath cough.  She is otherwise hemodynamically stable.  Despite 2 rounds of treatment in the ER she is still wheezing and evidence of some hypoxia.  She is being admitted for treatment..  ED Course: Temperature is 98.6 blood pressure 175/94 pulse 121 respirate of 22 oxygen sats 86% on room air.  White count 16.8, otherwise rest of CBC within normal.  Chest x-ray showed no significant findings.  CT abdomen and pelvis and CT angiogram of the chest showed no PE.  There was mild bibasilar and left upper lobe atelectasis.  Compression fracture deformity of the T6 vertebral body is seen also on CT L-spine.  Patient being admitted for further evaluation and treatment.  Review of Systems: As per HPI otherwise 10 point review of systems negative.    Past Medical History:  Diagnosis Date  . Anxiety   . Asthma   . COPD (chronic obstructive pulmonary disease) ([place])   . Depression     Past Surgical History:  Procedure Laterality Date  . ABDOMINAL HYSTERECTOMY     partial      reports that she quit smoking about 2 years ago. Her smoking use  included cigarettes. She has a 40.00 pack-year smoking history. She has never used smokeless tobacco. She reports that she does not drink alcohol and does not use drugs.  Allergies  Allergen Reactions  . Sulfa Antibiotics Other (See Comments)    Mouth gets raw    Family History  Problem Relation Age of Onset  . Asthma Mother   . Lung cancer Father   . COPD Father      Prior to Admission medications   Medication Sig Start Date End Date Taking? Authorizing Provider  albuterol (PROAIR HFA) 108 (90 Base) MCG/ACT inhaler Inhale 2 puffs into the lungs every 6 (six) hours as needed for wheezing or shortness of breath. 07/25/19  Yes Tanda Rockers, MD  albuterol (PROVENTIL) (2.5 MG/3ML) 0.083% nebulizer solution Take 3 mLs (2.5 mg total) by nebulization every 6 (six) hours as needed for wheezing or shortness of breath. 09/02/19  Yes Alma Friendly, MD  ALPRAZolam Duanne Moron) 0.25 MG tablet Take 0.25 mg by mouth daily as needed for anxiety.  07/21/17  Yes [provider]  baclofen (LIORESAL) 10 MG tablet Take 10 mg by mouth 3 (three) times daily as needed. 09/05/19  Yes [provider]  bismuth subsalicylate (PEPTO BISMOL) 262 MG chewable tablet Chew 262-524 mg by mouth daily as needed for diarrhea or loose stools.   Yes [provider]  budesonide-formoterol (SYMBICORT) 160-4.5 MCG/ACT inhaler INHALE 2 PUFFS INTO LUNGS TWICE A DAY  Patient taking differently: Inhale 2 puffs into the lungs in the morning and at bedtime.  08/26/19  Yes Tanda Rockers, MD  dextromethorphan-guaiFENesin [facility] DM) 30-600 MG 12hr tablet Take 1 tablet by mouth 2 (two) times daily as needed for up to 7 days for cough. 09/02/19 09/09/19 Yes Alma Friendly, MD  EPINEPHrine 0.3 mg/0.3 mL IJ SOAJ injection Inject 0.3 mLs (0.3 mg total) into the muscle as needed for anaphylaxis. 07/15/19  Yes Martyn Ehrich, NP  famotidine (PEPCID) 20 MG tablet One at bedtime Patient taking differently: Take  20 mg by mouth at bedtime.  07/25/19  Yes Tanda Rockers, MD  FASENRA 30 MG/ML SOSY INJECT 1 SYRINGE UNDER THE SKIN EVERY 8 WEEKS. Patient taking differently: Inject 30 mg into the skin every 8 (eight) weeks.  01/20/19  Yes Tanda Rockers, MD  HYDROcodone-acetaminophen (NORCO/VICODIN) 5-325 MG tablet Take 1 tablet by mouth in the morning, at noon, in the evening, and at bedtime.  09/05/19  Yes [provider]  ibuprofen (ADVIL) 800 MG tablet Take 800 mg by mouth 2 (two) times daily as needed for pain. 08/25/19  Yes [provider]  lamoTRIgine (LAMICTAL) 200 MG tablet Take 200 mg by mouth at bedtime.    Yes [provider]  loratadine (CLARITIN) 10 MG tablet Take 10 mg by mouth every evening.    Yes [provider]  methylphenidate 18 MG PO CR tablet Take 18 mg by mouth daily. Take with 54 mg tablet= 72mg  07/19/19  Yes [provider]  methylphenidate 54 MG PO CR tablet Take 54 mg by mouth every morning. Take with 18 mg tablet= 72mg    Yes [provider]  montelukast (SINGULAIR) 10 MG tablet Take 1 tablet (10 mg total) by mouth at bedtime. 04/17/19  Yes Tanda Rockers, MD  pantoprazole (PROTONIX) 40 MG tablet Take 1 tablet (40 mg total) by mouth daily. Take 30-60 min before first meal of the day 07/25/19  Yes Tanda Rockers, MD  predniSONE (DELTASONE) 10 MG tablet Take 5 tablets (50 mg total) by mouth daily for 3 days, THEN 4 tablets (40 mg total) daily for 3 days, THEN 3 tablets (30 mg total) daily for 3 days, THEN 2 tablets (20 mg total) daily for 3 days, THEN 1 tablet (10 mg total) daily for 3 days. 09/02/19 09/17/19 Yes Alma Friendly, MD  QUEtiapine (SEROQUEL) 100 MG tablet Take 150 mg by mouth at bedtime. 08/18/19  Yes [provider]  risperiDONE (RISPERDAL) 1 MG tablet Take 3 mg by mouth at bedtime.  07/06/17  Yes [provider]  Tiotropium Bromide Monohydrate (SPIRIVA RESPIMAT) 2.5 MCG/ACT AERS Inhale 2 puffs into the lungs  daily. 09/02/19  Yes Tanda Rockers, MD  venlafaxine XR (EFFEXOR-XR) 150 MG 24 hr capsule Take 150 mg by mouth at bedtime.    Yes [provider]  Vitamin D, Ergocalciferol, (DRISDOL) 50000 units CAPS capsule Take 50,000 Units by mouth every Saturday.  06/18/17  Yes [provider]  Spacer/Aero-Holding Chambers (AEROCHAMBER MV) inhaler Use as instructed 03/14/18   Lauraine Rinne, NP  Tiotropium Bromide Monohydrate (SPIRIVA RESPIMAT) 2.5 MCG/ACT AERS Inhale 2 puffs into the lungs daily. Patient not taking: Reported on 09/08/2019 08/29/19   Tanda Rockers, MD    Physical Exam: Vitals:   09/08/19 1851 09/08/19 1900 09/08/19 1930 09/08/19 2030  BP:  (!) 113/97 132/84 (!) 161/89  Pulse: (!) 107 (!) 102 (!) 106 (!) 105  Resp:  (!) 28  (!) 31  Temp:      TempSrc:      SpO2: 90% 94% 94% 92%  Weight:      Height:          Constitutional: Morbidly obese, in mild distress Vitals:   09/08/19 1851 09/08/19 1900 09/08/19 1930 09/08/19 2030  BP:  (!) 113/97 132/84 (!) 161/89  Pulse: (!) 107 (!) 102 (!) 106 (!) 105  Resp:  (!) 28  (!) 31  Temp:      TempSrc:      SpO2: 90% 94% 94% 92%  Weight:      Height:       Eyes: PERRL, lids and conjunctivae normal ENMT: Mucous membranes are moist. Posterior pharynx clear of any exudate or lesions.Normal dentition.  Neck: normal, supple, no masses, no thyromegaly Respiratory: Decreased air entry bilaterally with marked expiratory wheezing, no crackles. Normal respiratory effort. No accessory muscle use.  Cardiovascular: Sinus tachycardia, no murmurs / rubs / gallops. No extremity edema. 2+ pedal pulses. No carotid bruits.  Abdomen: no tenderness, no masses palpated. No hepatosplenomegaly. Bowel sounds positive.  Musculoskeletal: no clubbing / cyanosis. No joint deformity upper and lower extremities. Good ROM, no contractures. Normal muscle tone.  Skin: no rashes, lesions, ulcers. No induration Neurologic: CN 2-12 grossly intact. Sensation  intact, DTR normal. Strength 5/5 in all 4.  Psychiatric: Normal judgment and insight. Alert and oriented x 3. Normal mood.     Labs on Admission: I have personally reviewed following labs and imaging studies  CBC: Recent Labs  Lab 09/08/19 1623  WBC 16.8*  HGB 13.2  HCT 41.7  MCV 101.7*  PLT 144   Basic Metabolic Panel: Recent Labs  Lab 09/08/19 1623  NA 138  K 4.4  CL 101  CO2 29  GLUCOSE 135*  BUN 19  CREATININE 0.67  CALCIUM 9.5   GFR: Estimated Creatinine Clearance: 73.2 mL/min (by C-G formula based on SCr of 0.67 mg/dL). Liver Function Tests: Recent Labs  Lab 09/08/19 1623  AST 22  ALT 50*  ALKPHOS 57  BILITOT 0.3  PROT 6.5  ALBUMIN 3.7   No results for input(s): LIPASE, AMYLASE in the last 168 hours. No results for input(s): AMMONIA in the last 168 hours. Coagulation Profile: No results for input(s): INR, PROTIME in the last 168 hours. Cardiac Enzymes: No results for input(s): CKTOTAL, CKMB, CKMBINDEX, TROPONINI in the last 168 hours. BNP (last 3 results) No results for input(s): PROBNP in the last 8760 hours. HbA1C: No results for input(s): HGBA1C in the last 72 hours. CBG: No results for input(s): GLUCAP in the last 168 hours. Lipid Profile: No results for input(s): CHOL, HDL, LDLCALC, TRIG, CHOLHDL, LDLDIRECT in the last 72 hours. Thyroid Function Tests: No results for input(s): TSH, T4TOTAL, FREET4, T3FREE, THYROIDAB in the last 72 hours. Anemia Panel: No results for input(s): VITAMINB12, FOLATE, FERRITIN, TIBC, IRON, RETICCTPCT in the last 72 hours. Urine analysis:    Component Value Date/Time   BILIRUBINUR n 09/23/2012 1111   PROTEINUR n 09/23/2012 1111   UROBILINOGEN 0.2 09/23/2012 1111   NITRITE n 09/23/2012 1111   LEUKOCYTESUR Negative 09/23/2012 1111   Sepsis Labs: @LABRCNTIP (procalcitonin:4,lacticidven:4) ) Recent Results (from the past 240 hour(s))  SARS Coronavirus 2 by RT PCR (hospital order, performed in [facility]  hospital lab) Nasopharyngeal Nasopharyngeal Swab     Status: None   Collection Time: 09/08/19  4:23 PM   Specimen: Nasopharyngeal Swab  Result Value Ref Range  Status   SARS Coronavirus 2 NEGATIVE NEGATIVE Final    Comment: (NOTE) SARS-CoV-2 target nucleic acids are NOT DETECTED.  The SARS-CoV-2 RNA is generally detectable in upper and lower respiratory specimens during the acute phase of infection. The lowest concentration of SARS-CoV-2 viral copies this assay can detect is 250 copies / mL. A negative result does not preclude SARS-CoV-2 infection and should not be used as the sole basis for treatment or other patient management decisions.  A negative result may occur with improper specimen collection / handling, submission of specimen other than nasopharyngeal swab, presence of viral mutation(s) within the areas targeted by this assay, and inadequate number of viral copies (<250 copies / mL). A negative result must be combined with clinical observations, patient history, and epidemiological information.  Fact Sheet for Patients:   StrictlyIdeas.no  Fact Sheet for Healthcare Providers: BankingDealers.co.za  This test is not yet approved or  cleared by the [place] FDA and has been authorized for detection and/or diagnosis of SARS-CoV-2 by FDA under an Emergency Use Authorization (EUA).  This EUA will remain in effect (meaning this test can be used) for the duration of the COVID-19 declaration under Section 564(b)(1) of the Act, 21 U.S.C. section 360bbb-3(b)(1), unless the authorization is terminated or revoked sooner.  Performed at [facility], [place] [address] [place], [place] [address]      Radiological Exams on Admission: CT ANGIO CHEST PE W OR WO CONTRAST  Result Date: 09/08/2019 CLINICAL DATA:  COPD exacerbation. EXAM: CT ANGIOGRAPHY CHEST WITH CONTRAST TECHNIQUE: Multidetector CT imaging of the chest  was performed using the standard protocol during bolus administration of intravenous contrast. Multiplanar CT image reconstructions and MIPs were obtained to evaluate the vascular anatomy. CONTRAST:  152mL OMNIPAQUE IOHEXOL 350 MG/ML SOLN COMPARISON:  August 23, 2017 FINDINGS: Cardiovascular: Satisfactory opacification of the pulmonary arteries to the segmental level. No evidence of pulmonary embolism. Normal heart size. No pericardial effusion. Marked severity coronary artery calcification is noted Mediastinum/Nodes: No enlarged mediastinal, hilar, or axillary lymph nodes. Thyroid gland, trachea, and esophagus demonstrate no significant findings. Lungs/Pleura: Mild atelectasis is seen within the bilateral lung bases and posterior aspect of the left upper lobe. There is no evidence of a pleural effusion or pneumothorax. Upper Abdomen: A 2.6 cm cyst is seen within the right kidney. Musculoskeletal: A compression fracture deformity of indeterminate age is seen involving the T6 vertebral body. This represents a new finding compared to the prior exam. Multilevel degenerative changes are noted throughout the remainder of the thoracic spine. Review of the MIP images confirms the above findings. IMPRESSION: 1. No evidence of pulmonary embolism. 2. Mild bibasilar and left upper lobe atelectasis. 3. Marked severity coronary artery calcification. 4. Compression fracture deformity of indeterminate age involving the T6 vertebral body. This represents a new finding compared to the prior exam. Electronically Signed   By: Virgina Norfolk M.D.   On: 09/08/2019 20:24   CT ABDOMEN PELVIS W CONTRAST  Result Date: 09/08/2019 CLINICAL DATA:  COPD exacerbation, right flank pain, low back pain EXAM: CT ABDOMEN AND PELVIS WITH CONTRAST TECHNIQUE: Multidetector CT imaging of the abdomen and pelvis was performed using the standard protocol following bolus administration of intravenous contrast. CONTRAST:  175mL OMNIPAQUE IOHEXOL 350  MG/ML SOLN COMPARISON:  None. FINDINGS: Lower chest: Scattered atelectasis or scarring at the lung bases. No acute pleural or parenchymal lung disease. Hepatobiliary: No focal liver abnormality is seen. No gallstones, gallbladder wall thickening, or biliary dilatation. Pancreas: Unremarkable.  No pancreatic ductal dilatation or surrounding inflammatory changes. Spleen: Normal in size without focal abnormality. Adrenals/Urinary Tract: Small right renal cyst. Otherwise the kidneys enhance normally. No urinary tract calculi or obstructive uropathy. The adrenals and bladder are unremarkable. Stomach/Bowel: No bowel obstruction or ileus. No wall thickening or inflammatory change. The appendix, if still present, is not well visualized. There are no inflammatory changes to suggest appendicitis. Vascular/Lymphatic: Aortic atherosclerosis. No enlarged abdominal or pelvic lymph nodes. Reproductive: Status post hysterectomy. No adnexal masses. Other: No free fluid or free gas.  No abdominal wall hernia. Musculoskeletal: There are no acute displaced fractures. There is diffuse spondylosis throughout the thoracolumbar spine. From L3/L4 through L5/S1 there is circumferential disc bulge and bilateral facet hypertrophy resulting in mild central canal stenosis and symmetrical neural foraminal encroachment. These changes are most pronounced at the L4/L5 and L5/S1 levels. Reconstructed images demonstrate no additional findings. IMPRESSION: 1. No acute intra-abdominal or intrapelvic process. 2. Prominent lower lumbar spondylosis greatest at L4/L5 and L5/S1. 3. Aortic Atherosclerosis (ICD10-I70.0). Electronically Signed   By: Randa Ngo M.D.   On: 09/08/2019 20:17   CT L-SPINE NO CHARGE  Result Date: 09/08/2019 CLINICAL DATA:  COPD exacerbation, low back pain, right flank pain EXAM: CT LUMBAR SPINE WITHOUT CONTRAST TECHNIQUE: Multidetector CT imaging of the lumbar spine was performed without intravenous contrast administration.  Multiplanar CT image reconstructions were also generated. COMPARISON:  None. FINDINGS: Segmentation: There are 5 non-rib-bearing lumbar type vertebral bodies. Alignment: Alignment is anatomic. Vertebrae: There are no acute displaced fractures. No destructive bony lesions. Paraspinal and other soft tissues: Paraspinal soft tissues are unremarkable. Atherosclerosis throughout the abdominal aorta. Disc levels: There is multilevel thoracolumbar spondylosis. Prominent anterior osteophytes are seen from T10 through L2. No significant central canal or neural foraminal encroachment. At L3/L4 there is bilateral left greater than right neural foraminal encroachment due to circumferential disc bulge and bilateral facet hypertrophy. And L4/L5 there is circumferential disc bulge with bilateral facet hypertrophy resulting in mild central stenosis and symmetrical neural foraminal encroachment. At L5/S1 there is mild circumferential disc bulge and bilateral facet hypertrophy with mild symmetrical neural foraminal encroachment. Reconstructed images demonstrate no additional findings. IMPRESSION: 1. Multilevel thoracolumbar spondylosis, greatest from L3/L4 through L5/S1 as above. 2. No acute bony abnormality. Electronically Signed   By: Randa Ngo M.D.   On: 09/08/2019 20:49   DG Chest Port 1 View  Result Date: 09/08/2019 CLINICAL DATA:  Dyspnea and shortness of breath x2 days. EXAM: PORTABLE CHEST 1 VIEW COMPARISON:  August 27, 2019 FINDINGS: Mild atelectasis and/or infiltrate is seen within the left lung base. There is no evidence of a pleural effusion or pneumothorax. The heart size and mediastinal contours are within normal limits. Tortuosity of the descending thoracic aorta is seen. The visualized skeletal structures are unremarkable. IMPRESSION: Mild left basilar atelectasis and/or infiltrate. Electronically Signed   By: Virgina Norfolk M.D.   On: 09/08/2019 16:30    EKG: Independently reviewed.  It shows sinus  rhythm no significant ST changes.  Assessment/Plan Principal Problem:   Acute on chronic respiratory failure with hypoxia (HCC) Active Problems:   Tobacco abuse   Hyperlipidemia   Anxiety   COPD exacerbation (HCC)   Essential hypertension   Back pain     #1 acute on chronic respiratory failure with hypoxia: Secondary to COPD exacerbation.  Patient will be admitted.  We will follow the COPD gold protocol.  Continue close monitoring.  COVID-19 screen is negative.  #2 compression fracture of T6 vertebra:  Cause of patient's back pain.  Not clear if this is acute or some acute or even chronic.  Treat symptomatically.  May evaluate for possible kyphoplasty.  #3 tobacco abuse: Counseling provided and nicotine patch added.  #4 essential hypertension: Continue blood pressure control with home regimen.  #5 anxiety disorder: Continue home regimen  #6 hyperlipidemia: Continue statin    DVT prophylaxis: Lovenox Code Status: Full code Family Communication: Discussed care with patient no family around Disposition Plan: Home Consults called: None Admission status: Inpatient  Severity of Illness: The appropriate patient status for this patient is INPATIENT. Inpatient status is judged to be reasonable and necessary in order to provide the required intensity of service to ensure the patient's safety. The patient's presenting symptoms, physical exam findings, and initial radiographic and laboratory data in the context of their chronic comorbidities is felt to place them at high risk for further clinical deterioration. Furthermore, it is not anticipated that the patient will be medically stable for discharge from the hospital within 2 midnights of admission. The following factors support the patient status of inpatient.   " The patient's presenting symptoms include shortness of breath and low back pain. " The worrisome physical exam findings include diffuse expiratory wheezing. " The initial  radiographic and laboratory data are worrisome because of evidence of T6 compression fracture. " The chronic co-morbidities include COPD.   * I certify that at the point of admission it is my clinical judgment that the patient will require inpatient hospital care spanning beyond 2 midnights from the point of admission due to high intensity of service, high risk for further deterioration and high frequency of surveillance required.Barbette Merino MD [facility] Pager [phone]  If 7PM-7AM, please contact night-coverage [email or web address] Password [facility]  09/08/2019, 8:57 PM

## 2019-09-09 ENCOUNTER — Encounter (HOSPITAL_COMMUNITY): Payer: Self-pay | Admitting: Internal Medicine

## 2019-09-09 ENCOUNTER — Inpatient Hospital Stay (HOSPITAL_COMMUNITY): Payer: BC Managed Care – PPO

## 2019-09-09 DIAGNOSIS — J9621 Acute and chronic respiratory failure with hypoxia: Secondary | ICD-10-CM

## 2019-09-09 LAB — CBC WITH DIFFERENTIAL/PLATELET
Abs Immature Granulocytes: 0.21 10*3/uL — ABNORMAL HIGH (ref 0.00–0.07)
Basophils Absolute: 0 10*3/uL (ref 0.0–0.1)
Basophils Relative: 0 %
Eosinophils Absolute: 0 10*3/uL (ref 0.0–0.5)
Eosinophils Relative: 0 %
HCT: 37.5 % (ref 36.0–46.0)
Hemoglobin: 11.8 g/dL — ABNORMAL LOW (ref 12.0–15.0)
Immature Granulocytes: 2 %
Lymphocytes Relative: 5 %
Lymphs Abs: 0.7 10*3/uL (ref 0.7–4.0)
MCH: 32.1 pg (ref 26.0–34.0)
MCHC: 31.5 g/dL (ref 30.0–36.0)
MCV: 101.9 fL — ABNORMAL HIGH (ref 80.0–100.0)
Monocytes Absolute: 0.2 10*3/uL (ref 0.1–1.0)
Monocytes Relative: 2 %
Neutro Abs: 13.3 10*3/uL — ABNORMAL HIGH (ref 1.7–7.7)
Neutrophils Relative %: 91 %
Platelets: 239 10*3/uL (ref 150–400)
RBC: 3.68 MIL/uL — ABNORMAL LOW (ref 3.87–5.11)
RDW: 14.5 % (ref 11.5–15.5)
WBC: 14.5 10*3/uL — ABNORMAL HIGH (ref 4.0–10.5)
nRBC: 0 % (ref 0.0–0.2)

## 2019-09-09 LAB — COMPREHENSIVE METABOLIC PANEL
ALT: 46 U/L — ABNORMAL HIGH (ref 0–44)
AST: 19 U/L (ref 15–41)
Albumin: 3.4 g/dL — ABNORMAL LOW (ref 3.5–5.0)
Alkaline Phosphatase: 53 U/L (ref 38–126)
Anion gap: 9 (ref 5–15)
BUN: 18 mg/dL (ref 8–23)
CO2: 27 mmol/L (ref 22–32)
Calcium: 9.6 mg/dL (ref 8.9–10.3)
Chloride: 101 mmol/L (ref 98–111)
Creatinine, Ser: 0.55 mg/dL (ref 0.44–1.00)
GFR calc Af Amer: >60 mL/min (ref >60)
GFR calc non Af Amer: >60 mL/min (ref >60)
Glucose, Bld: 135 mg/dL — ABNORMAL HIGH (ref 70–99)
Potassium: 4.6 mmol/L (ref 3.5–5.1)
Sodium: 137 mmol/L (ref 135–145)
Total Bilirubin: 0.7 mg/dL (ref 0.3–1.2)
Total Protein: 6.1 g/dL — ABNORMAL LOW (ref 6.5–8.1)

## 2019-09-09 LAB — ECHOCARDIOGRAM COMPLETE
Area-P 1/2: 3.6 cm2
Height: 63 in
S' Lateral: 4.4 cm
Weight: 3139.35 oz

## 2019-09-09 LAB — HIV ANTIBODY (ROUTINE TESTING W REFLEX): HIV Screen 4th Generation wRfx: NONREACTIVE

## 2019-09-09 MED ORDER — METHYLPHENIDATE HCL ER (OSM) 18 MG PO TBCR
72.0000 mg | EXTENDED_RELEASE_TABLET | Freq: Once | ORAL | Status: AC
  Administered 2019-09-09: 72 mg via ORAL
  Filled 2019-09-09: qty 4

## 2019-09-09 MED ORDER — METHYLPHENIDATE HCL ER (OSM) 36 MG PO TBCR: 72.0000 mg | EXTENDED_RELEASE_TABLET | ORAL | Status: DC

## 2019-09-09 MED ORDER — METHYLPHENIDATE HCL ER 18 MG PO TB24
72.0000 mg | ORAL_TABLET | ORAL | Status: DC
  Administered 2019-09-12: 72 mg via ORAL
  Filled 2019-09-09 (×2): qty 4

## 2019-09-09 MED ORDER — ALPRAZOLAM 0.25 MG PO TABS
0.2500 mg | ORAL_TABLET | Freq: Two times a day (BID) | ORAL | Status: DC | PRN
  Administered 2019-09-09 – 2019-09-12 (×3): 0.25 mg via ORAL
  Filled 2019-09-09 (×3): qty 1

## 2019-09-09 NOTE — ED Notes (Signed)
Awaiting pharmacy to send up dose of umeclidinium bromide. Will be a delay in pt receiving this med until it comes up from pharmacy.

## 2019-09-09 NOTE — Evaluation (Signed)
Physical Therapy Evaluation Patient Details Name: Julia Daniels MRN: [id number] DOB: Dec 04, 1950 Today's Date: 09/09/2019   History of Present Illness  Julia Daniels is a 69 y.o. year-old female with a history of COPD presenting to the ED8/30/  with chief complaint of shortness of breath.Admitted 8/18 through 09/02/2019 with a COPD exacerbation. Noted T6 compression fracture to be treated with TLSO or vertebroplasty  Clinical Impression  The patient noted to be emotional about her current situation, return to hospital with SOB. Patient has limited support at home as son works. Patient reports back pain has worsened and  Became more SOB after Dc recently. Patient my benefit from increased support at home vs a rehab setting, although patient  Is declining SNF .Pt admitted with above diagnosis.   Pt currently with functional limitations due to the deficits listed below (see PT Problem List). Pt will benefit from skilled PT to increase their independence and safety with mobility to allow discharge to the venue listed below.   Patient on 2 L [place] at 94% at rest, dropped to 87 % with mobility, noted 3/4 dyspnea with mobility.      Follow Up Recommendations Home health PT; supervision - vs SNF    Equipment Recommendations  Rolling walker with 5" wheels    Recommendations for Other Services       Precautions / Restrictions Precautions Precautions: Fall Precaution Comments: Thoracic Back pain, monitor O2 sats Required Braces or Orthoses: Spinal Brace- not in room at eval. Spinal Brace: Thoracolumbosacral orthotic; Restrictions Other Position/Activity Restrictions: non specific for when to don/doff TLSO      Mobility  Bed Mobility Overal bed mobility: Needs Assistance Bed Mobility: Rolling;Sidelying to Sit;Sit to Sidelying Rolling: Min assist Sidelying to sit: Mod assist     Sit to sidelying: Mod assist General bed mobility comments: increased time \ assist with trunk to  upright posture and then back to side/ sidelying  Transfers Overall transfer level: Needs assistance Equipment used: Rolling walker (2 wheeled) Transfers: Sit to/from Stand Sit to Stand: Min assist         General transfer comment: stood at Rw, able to take 4 side steps along stretcher  Ambulation/Gait                Stairs            Wheelchair Mobility    Modified Rankin (Stroke Patients Only)       Balance Overall balance assessment: Needs assistance Sitting-balance support: Feet unsupported;Bilateral upper extremity supported Sitting balance-Leahy Scale: Fair     Standing balance support: During functional activity;Bilateral upper extremity supported Standing balance-Leahy Scale: Fair Standing balance comment: reliant on UEs for dynamic tasks                             Pertinent Vitals/Pain Faces Pain Scale: Hurts worst Pain Location: mid thoracic back with  mobility Pain Descriptors / Indicators: Aching;Moaning;Crying;Grimacing;Guarding Pain Intervention(s): Repositioned;Monitored during session;Premedicated before session;Limited activity within patient's tolerance;Heat applied    Home Living Family/patient expects to be discharged to:: Private residence Living Arrangements: Children Available Help at Discharge: Family;Available PRN/intermittently Type of Home: House Home Access: Stairs to enter Entrance Stairs-Rails: Right Entrance Stairs-Number of Steps: 4 Home Layout: One level Home Equipment: None      Prior Function Level of Independence: Independent         Comments: 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     Engineer, manufacturing  [employer]        Extremity/Trunk Assessment   Upper Extremity Assessment Upper Extremity Assessment: Defer to OT evaluation    Lower Extremity Assessment Lower Extremity Assessment: Generalized weakness    Cervical / Trunk Assessment Cervical / Trunk Assessment: Normal Cervical / Trunk  Exceptions: rounded shoulders  Communication   Communication: No difficulties  Cognition Arousal/Alertness: Awake/alert Behavior During Therapy: Anxious;Restless                                   General Comments: emotional about being in hospital again and not being able to work      General Comments      Exercises     Assessment/Plan    PT Assessment Patient needs continued PT services  PT Problem List Decreased mobility;Decreased activity tolerance;Decreased balance;Decreased knowledge of use of DME;Pain;Cardiopulmonary status limiting activity       PT Treatment Interventions DME instruction;Therapeutic exercise;Gait training;Functional mobility training;Therapeutic activities;Patient/family education    PT Goals (Current goals can be found in the Care Plan section)  Acute Rehab PT Goals Patient Stated Goal: to go home, not come back PT Goal Formulation: With patient Time For Goal Achievement: 09/23/19 Potential to Achieve Goals: Good    Frequency Min 3X/week   Barriers to discharge Decreased caregiver support son works  different shift    Co-evaluation               AM-PAC PT "6 Clicks" Mobility  Outcome Measure Help needed turning from your back to your side while in a flat bed without using bedrails?: A Lot Help needed moving from lying on your back to sitting on the side of a flat bed without using bedrails?: A Lot Help needed moving to and from a bed to a chair (including a wheelchair)?: A Lot Help needed standing up from a chair using your arms (e.g., wheelchair or bedside chair)?: A Lot Help needed to walk in hospital room?: A Lot Help needed climbing 3-5 steps with a railing? : A Lot 6 Click Score: 12    End of Session   Activity Tolerance: Patient limited by fatigue;Patient limited by pain Patient left: in bed;with call bell/phone within reach;with bed alarm set Nurse Communication: Mobility status PT Visit Diagnosis: Difficulty  in walking, not elsewhere classified (R26.2)    Time: 8099-8338 PT Time Calculation (min) (ACUTE ONLY): 33 min   Charges:   PT Evaluation $PT Eval Moderate Complexity: 1 Mod PT Treatments $Therapeutic Activity: 8-22 mins        Tresa Endo PT Acute Rehabilitation Services Pager [phone] Office [phone]   [place], Leyh 09/09/2019, 2:57 PM

## 2019-09-09 NOTE — Progress Notes (Signed)
PROGRESS NOTE    Julia Daniels  GBT:[id number] DOB: November 08, 1950 DOA: 09/08/2019 PCP: Simona Huh, NP  Brief Narrative:  62 white female COPD Gold 3 COPD/asthma prior smoker 12/16/2017-at baseline mMRC 2 cannot walk on a normal place at a flat grade-previously on Symbicort 160 twice daily Spiriva 2 puffs daily Was started on Fasenra 8 weekly (with marked improvement) Was unable to tolerate Singulair secondary to some side effects apparently Does seem to need chronic steroids and seems to have grade D symptoms and has been started in the past on PPI to help with possible reflux component  Bipolar with predominant anxiety component ADD Last seen by Dr. Melvyn Novas 07/25/2019 and has had significant coaching with inhaler  Admitted 8/18 through 09/02/2019 with a COPD she was given IV steroids and sent home with inhalers and doxycycline The pulmonary medications on discharge were albuterol including albuterol nebs, Symbicort twice daily Singulair (apparently has an allergy?) and tapering steroids and Spiriva  She tells me that she ran out of the meds 1 week ago and was able to tolerate this fine until Saturday and dyspneic through 8/29 EMS came to visit her house which she did not come in She worsened on 8/30 came to the [facility] emergency and received on route 10 of albuterol, 0.5 Atrovent, 125 Solu-Medrol On work-up she was found to have a compression fracture as well  Assessment & Plan:   Principal Problem:   Acute on chronic respiratory failure with hypoxia ([place]) Active Problems:   Tobacco abuse   Hyperlipidemia   Anxiety   COPD exacerbation ([place])   Essential hypertension   Back pain   1. AE COPD Gold 3, stage D symptoms a. Continue albuterol nebs every 6 as needed, with rescue every 2 as needed and may use inhalers b. She is still quite wheezy and somewhat hypoxic at term down her oxygen from 4 to 2 L and we will need to see how she does over the next several hours and or  days c. Continue DuoNeb d. Continue Dulera as a substitution for Symbicort e. Continue Incruse Ellipta f. Does seem to have singular on her MAR but this may be an allergy? g. I will order a nonemergent echo it looks like Dr. Melvyn Novas was searching for other causes for dyspnea in the outpatient setting and with her habitus it would not be surprising that her COPD can cause some right-sided heart symptoms 2. Compression fracture lower back at T6 a. Patient has prominent lumbar spondylosis L4/L5, L5/L1 b. This represents a new finding if her pain is not controlled she may require either kyphoplasty or a TLSO brace c. We will reevaluate this in the morning 3. BMI >34 4. Leukocytosis probably secondary to steroids a. Likely secondary to steroids b. Already has been deescalated to prednisone 40 but we will need to reevaluate her clinically to see if she needs IV c. Does not require magnesium as she is improving some of my exam but still with 5. Bipolar a. Significant anxiety component b. May need to cut back on her consult in the outpatient setting?  This may have a bearing on her anxiety c. Continue Seroquel 150 at bedtime Risperdal 3 at bedtime fracture or XR 150 and Xanax 0.25 as needed d. She is also on baclofen for some unclear reason which has been continued e. Try to limit pain meds including Norco but may not have a choice if she has chronic pain in addition to a superimposed fracture 6. HTN  DVT prophylaxis: Lovenox Code Status: Full Family Communication: None present in the ED Disposition:   Status is: Inpatient  Remains inpatient appropriate because:Persistent severe electrolyte disturbances, Ongoing active pain requiring inpatient pain management and Altered mental status   Dispo: The patient is from: Home              Anticipated d/c is to: Home              Anticipated d/c date is: 2 days              Patient currently is not medically stable to d/c.       Consultants:    Pulmonology virtually consulted  Procedures: None  Antimicrobials: Ceftriaxone azithromycin   Subjective: Doing fair but quite anxious at the bedside No chest pain No fever Tells me he ran out of meds Quite tearful   Objective: Vitals:   09/09/19 0732 09/09/19 0805 09/09/19 0903 09/09/19 0914  BP: (!) 183/97  (!) 175/93 (!) 175/93  Pulse: (!) 105  (!) 108 66  Resp: (!) 25  (!) 22 (!) 22  Temp: 98.6 F (37 C)   98.6 F (37 C)  TempSrc: Oral   Oral  SpO2: 93% 95% 90% 90%  Weight:      Height:       No intake or output data in the 24 hours ending 09/09/19 1017 Filed Weights   09/08/19 1551  Weight: 89 kg    Examination:  General exam: Very anxious quite tearful at times Respiratory system: Wheeze bilaterally posterolaterally Cardiovascular system: S1-S2 mild tachycardia no PMI displacement Gastrointestinal system: Soft nontender no rebound no guarding. Central nervous system: Grossly intact moving all 4 limbs equally Extremities: Neurologically intact Skin: No lower extremity edema to my exam Psychiatry: Anxious   Data Reviewed: I have personally reviewed following labs and imaging studies  Sodium 137 BUNs/creatinine 18/0.5 White count down from 16-14 Troponin VII  Radiology Studies: CT ANGIO CHEST PE W OR WO CONTRAST  Result Date: 09/08/2019 CLINICAL DATA:  COPD exacerbation. EXAM: CT ANGIOGRAPHY CHEST WITH CONTRAST TECHNIQUE: Multidetector CT imaging of the chest was performed using the standard protocol during bolus administration of intravenous contrast. Multiplanar CT image reconstructions and MIPs were obtained to evaluate the vascular anatomy. CONTRAST:  110mL OMNIPAQUE IOHEXOL 350 MG/ML SOLN COMPARISON:  August 23, 2017 FINDINGS: Cardiovascular: Satisfactory opacification of the pulmonary arteries to the segmental level. No evidence of pulmonary embolism. Normal heart size. No pericardial effusion. Marked severity coronary artery calcification is  noted Mediastinum/Nodes: No enlarged mediastinal, hilar, or axillary lymph nodes. Thyroid gland, trachea, and esophagus demonstrate no significant findings. Lungs/Pleura: Mild atelectasis is seen within the bilateral lung bases and posterior aspect of the left upper lobe. There is no evidence of a pleural effusion or pneumothorax. Upper Abdomen: A 2.6 cm cyst is seen within the right kidney. Musculoskeletal: A compression fracture deformity of indeterminate age is seen involving the T6 vertebral body. This represents a new finding compared to the prior exam. Multilevel degenerative changes are noted throughout the remainder of the thoracic spine. Review of the MIP images confirms the above findings. IMPRESSION: 1. No evidence of pulmonary embolism. 2. Mild bibasilar and left upper lobe atelectasis. 3. Marked severity coronary artery calcification. 4. Compression fracture deformity of indeterminate age involving the T6 vertebral body. This represents a new finding compared to the prior exam. Electronically Signed   By: Virgina Norfolk M.D.   On: 09/08/2019 20:24   CT ABDOMEN PELVIS W  CONTRAST  Result Date: 09/08/2019 CLINICAL DATA:  COPD exacerbation, right flank pain, low back pain EXAM: CT ABDOMEN AND PELVIS WITH CONTRAST TECHNIQUE: Multidetector CT imaging of the abdomen and pelvis was performed using the standard protocol following bolus administration of intravenous contrast. CONTRAST:  172mL OMNIPAQUE IOHEXOL 350 MG/ML SOLN COMPARISON:  None. FINDINGS: Lower chest: Scattered atelectasis or scarring at the lung bases. No acute pleural or parenchymal lung disease. Hepatobiliary: No focal liver abnormality is seen. No gallstones, gallbladder wall thickening, or biliary dilatation. Pancreas: Unremarkable. No pancreatic ductal dilatation or surrounding inflammatory changes. Spleen: Normal in size without focal abnormality. Adrenals/Urinary Tract: Small right renal cyst. Otherwise the kidneys enhance normally.  No urinary tract calculi or obstructive uropathy. The adrenals and bladder are unremarkable. Stomach/Bowel: No bowel obstruction or ileus. No wall thickening or inflammatory change. The appendix, if still present, is not well visualized. There are no inflammatory changes to suggest appendicitis. Vascular/Lymphatic: Aortic atherosclerosis. No enlarged abdominal or pelvic lymph nodes. Reproductive: Status post hysterectomy. No adnexal masses. Other: No free fluid or free gas.  No abdominal wall hernia. Musculoskeletal: There are no acute displaced fractures. There is diffuse spondylosis throughout the thoracolumbar spine. From L3/L4 through L5/S1 there is circumferential disc bulge and bilateral facet hypertrophy resulting in mild central canal stenosis and symmetrical neural foraminal encroachment. These changes are most pronounced at the L4/L5 and L5/S1 levels. Reconstructed images demonstrate no additional findings. IMPRESSION: 1. No acute intra-abdominal or intrapelvic process. 2. Prominent lower lumbar spondylosis greatest at L4/L5 and L5/S1. 3. Aortic Atherosclerosis (ICD10-I70.0). Electronically Signed   By: Randa Ngo M.D.   On: 09/08/2019 20:17   CT L-SPINE NO CHARGE  Result Date: 09/08/2019 CLINICAL DATA:  COPD exacerbation, low back pain, right flank pain EXAM: CT LUMBAR SPINE WITHOUT CONTRAST TECHNIQUE: Multidetector CT imaging of the lumbar spine was performed without intravenous contrast administration. Multiplanar CT image reconstructions were also generated. COMPARISON:  None. FINDINGS: Segmentation: There are 5 non-rib-bearing lumbar type vertebral bodies. Alignment: Alignment is anatomic. Vertebrae: There are no acute displaced fractures. No destructive bony lesions. Paraspinal and other soft tissues: Paraspinal soft tissues are unremarkable. Atherosclerosis throughout the abdominal aorta. Disc levels: There is multilevel thoracolumbar spondylosis. Prominent anterior osteophytes are seen from  T10 through L2. No significant central canal or neural foraminal encroachment. At L3/L4 there is bilateral left greater than right neural foraminal encroachment due to circumferential disc bulge and bilateral facet hypertrophy. And L4/L5 there is circumferential disc bulge with bilateral facet hypertrophy resulting in mild central stenosis and symmetrical neural foraminal encroachment. At L5/S1 there is mild circumferential disc bulge and bilateral facet hypertrophy with mild symmetrical neural foraminal encroachment. Reconstructed images demonstrate no additional findings. IMPRESSION: 1. Multilevel thoracolumbar spondylosis, greatest from L3/L4 through L5/S1 as above. 2. No acute bony abnormality. Electronically Signed   By: Randa Ngo M.D.   On: 09/08/2019 20:49   DG Chest Port 1 View  Result Date: 09/08/2019 CLINICAL DATA:  Dyspnea and shortness of breath x2 days. EXAM: PORTABLE CHEST 1 VIEW COMPARISON:  August 27, 2019 FINDINGS: Mild atelectasis and/or infiltrate is seen within the left lung base. There is no evidence of a pleural effusion or pneumothorax. The heart size and mediastinal contours are within normal limits. Tortuosity of the descending thoracic aorta is seen. The visualized skeletal structures are unremarkable. IMPRESSION: Mild left basilar atelectasis and/or infiltrate. Electronically Signed   By: Virgina Norfolk M.D.   On: 09/08/2019 16:30     Scheduled Meds: . baclofen  10 mg Oral TID  . doxycycline  100 mg Oral Q12H  . enoxaparin (LOVENOX) injection  40 mg Subcutaneous Q24H  . famotidine  20 mg Oral QHS  . ipratropium-albuterol  3 mL Nebulization Q6H  . lamoTRIgine  200 mg Oral QHS  . loratadine  10 mg Oral QPM  . [START ON 09/10/2019] methylphenidate  72 mg Oral BH-q7a  . methylPREDNISolone (SOLU-MEDROL) injection  125 mg Intravenous Q6H   Followed by  . [START ON 09/10/2019] predniSONE  40 mg Oral Q breakfast  . mometasone-formoterol  2 puff Inhalation BID  .  montelukast  10 mg Oral QHS  . pantoprazole  40 mg Oral Daily  . QUEtiapine  150 mg Oral QHS  . risperiDONE  3 mg Oral QHS  . umeclidinium bromide  1 puff Inhalation Daily  . venlafaxine XR  150 mg Oral QHS  . [START ON 09/13/2019] Vitamin D (Ergocalciferol)  50,000 Units Oral Q Sat   Continuous Infusions: . sodium chloride 100 mL/hr at 09/09/19 0920     LOS: 1 day    Time spent: [place], MD [facility] To contact the attending provider between 7A-7P or the covering provider during after hours 7P-7A, please log into the web site [email or web address] and access using universal [place] password for that web site. If you do not have the password, please call the hospital operator.  09/09/2019, 10:17 AM

## 2019-09-09 NOTE — ED Notes (Signed)
Respiratory alerted for duoneb, will be in the room shortly.

## 2019-09-09 NOTE — Progress Notes (Signed)
  Echocardiogram 2D Echocardiogram has been performed.  Julia Daniels 09/09/2019, 1:29 PM

## 2019-09-09 NOTE — Plan of Care (Signed)
Patient continues to have  pain 8/10 in back.

## 2019-09-09 NOTE — ED Notes (Signed)
Attempted to call report on pt. Nurse just received a pt from endo and will call the ER shortly to receive report.

## 2019-09-09 NOTE — Progress Notes (Signed)
Orthopedic Tech Progress Note Patient Details:  DHARA SCHEPP 02-25-1950 [id number]  Patient ID: Prudencio Burly, female   DOB: 12/04/1950, 69 y.o.   MRN: [id number]   [place] Pink 09/09/2019, 11:04 AMCall and Rout TLSO brace order to [employer]

## 2019-09-09 NOTE — Progress Notes (Signed)
   09/09/19 1000  Clinical Encounter Type  Visited With Patient  Visit Type Initial;Psychological support;Spiritual support;ED  Referral From Nurse  Consult/Referral To Chaplain  Spiritual Encounters  Spiritual Needs Emotional;Other (Comment) (Spiritual Care Conversation/Support)  Stress Factors  Patient Stress Factors Financial concerns;Health changes;Major life changes   I visited with Julia Daniels per referral from the nurse and my colleague. Julia Daniels was very anxious at the time of my visit. Her main stress factors are around loss of agency and her fear of losing her job due to her health condition. Julia Daniels's job is very important to her. Her support system is made up of people that she works with. We talked about ways to calm her anxiety and focus on getting her better.  I helped her to calm down and think of possibilities for the future.   She requested that I follow up with her, which I will do.   Chaplain Shanon Ace M.Div., [facility]

## 2019-09-09 NOTE — Progress Notes (Signed)
Orthopedic Tech Progress Note Patient Details:  KAMEA DACOSTA August 23, 1950 [id number] HANGER called needing a little more info for patient.. Patient ID: TEMA ALIRE, female   DOB: 1950-12-21, 69 y.o.   MRN: [id number]   Janit Pagan 09/09/2019, 11:15 AM

## 2019-09-10 ENCOUNTER — Telehealth: Payer: Self-pay | Admitting: Internal Medicine

## 2019-09-10 DIAGNOSIS — J441 Chronic obstructive pulmonary disease with (acute) exacerbation: Principal | ICD-10-CM

## 2019-09-10 DIAGNOSIS — S22050A Wedge compression fracture of T5-T6 vertebra, initial encounter for closed fracture: Secondary | ICD-10-CM

## 2019-09-10 DIAGNOSIS — J449 Chronic obstructive pulmonary disease, unspecified: Secondary | ICD-10-CM

## 2019-09-10 LAB — URINALYSIS, ROUTINE W REFLEX MICROSCOPIC
Bilirubin Urine: NEGATIVE
Glucose, UA: NEGATIVE mg/dL
Hgb urine dipstick: NEGATIVE
Ketones, ur: NEGATIVE mg/dL
Leukocytes,Ua: NEGATIVE
Nitrite: NEGATIVE
Protein, ur: NEGATIVE mg/dL
Specific Gravity, Urine: 1.018 (ref 1.005–1.030)
pH: 5 (ref 5.0–8.0)

## 2019-09-10 LAB — CBC WITH DIFFERENTIAL/PLATELET
Abs Immature Granulocytes: 0.33 10*3/uL — ABNORMAL HIGH (ref 0.00–0.07)
Basophils Absolute: 0.1 10*3/uL (ref 0.0–0.1)
Basophils Relative: 0 %
Eosinophils Absolute: 0 10*3/uL (ref 0.0–0.5)
Eosinophils Relative: 0 %
HCT: 38 % (ref 36.0–46.0)
Hemoglobin: 12.1 g/dL (ref 12.0–15.0)
Immature Granulocytes: 2 %
Lymphocytes Relative: 8 %
Lymphs Abs: 1.5 10*3/uL (ref 0.7–4.0)
MCH: 32.3 pg (ref 26.0–34.0)
MCHC: 31.8 g/dL (ref 30.0–36.0)
MCV: 101.3 fL — ABNORMAL HIGH (ref 80.0–100.0)
Monocytes Absolute: 1.4 10*3/uL — ABNORMAL HIGH (ref 0.1–1.0)
Monocytes Relative: 7 %
Neutro Abs: 15.7 10*3/uL — ABNORMAL HIGH (ref 1.7–7.7)
Neutrophils Relative %: 83 %
Platelets: 285 10*3/uL (ref 150–400)
RBC: 3.75 MIL/uL — ABNORMAL LOW (ref 3.87–5.11)
RDW: 14.3 % (ref 11.5–15.5)
WBC: 18.9 10*3/uL — ABNORMAL HIGH (ref 4.0–10.5)
nRBC: 0 % (ref 0.0–0.2)

## 2019-09-10 LAB — COMPREHENSIVE METABOLIC PANEL
ALT: 38 U/L (ref 0–44)
AST: 15 U/L (ref 15–41)
Albumin: 3.5 g/dL (ref 3.5–5.0)
Alkaline Phosphatase: 48 U/L (ref 38–126)
Anion gap: 9 (ref 5–15)
BUN: 20 mg/dL (ref 8–23)
CO2: 27 mmol/L (ref 22–32)
Calcium: 10.1 mg/dL (ref 8.9–10.3)
Chloride: 102 mmol/L (ref 98–111)
Creatinine, Ser: 0.58 mg/dL (ref 0.44–1.00)
GFR calc Af Amer: >60 mL/min (ref >60)
GFR calc non Af Amer: >60 mL/min (ref >60)
Glucose, Bld: 124 mg/dL — ABNORMAL HIGH (ref 70–99)
Potassium: 4.1 mmol/L (ref 3.5–5.1)
Sodium: 138 mmol/L (ref 135–145)
Total Bilirubin: 0.6 mg/dL (ref 0.3–1.2)
Total Protein: 6.2 g/dL — ABNORMAL LOW (ref 6.5–8.1)

## 2019-09-10 LAB — MRSA PCR SCREENING: MRSA by PCR: NEGATIVE

## 2019-09-10 MED ORDER — IPRATROPIUM-ALBUTEROL 0.5-2.5 (3) MG/3ML IN SOLN
3.0000 mL | Freq: Three times a day (TID) | RESPIRATORY_TRACT | Status: DC
  Administered 2019-09-11 – 2019-09-12 (×5): 3 mL via RESPIRATORY_TRACT
  Filled 2019-09-10 (×5): qty 3

## 2019-09-10 MED ORDER — ALBUTEROL SULFATE (2.5 MG/3ML) 0.083% IN NEBU: 2.5000 mg | INHALATION_SOLUTION | Freq: Four times a day (QID) | RESPIRATORY_TRACT | 12 refills | Status: AC | PRN

## 2019-09-10 NOTE — TOC Progression Note (Signed)
Transition of Care [facility]) - Progression Note    Patient Details  Name: Julia Daniels MRN: [id number] Date of Birth: [birth year]  [facility]) CM/SW Contact  Purcell Mouton, RN Phone Number: 09/10/2019, 10:55 AM  Clinical Narrative:    Pt lives at home with son who work during the day. Pt would benefit with CIR or SNF short term. Pt states that she needs to get stronger and breathe better and agreed with going to Rehab. Will continue to follow.    Expected Discharge Plan: [place] Barriers to Discharge: No Barriers Identified  Expected Discharge Plan and Services Expected Discharge Plan: [place] arrangements for the past 2 months: Single Family Home Expected Discharge Date:  (unknown)                                     Social Determinants of Health (SDOH) Interventions    Readmission Risk Interventions No flowsheet data found.

## 2019-09-10 NOTE — Progress Notes (Signed)
PROGRESS NOTE    Julia RODRIGES  FWY:[id number] DOB: 12-11-1950 DOA: 09/08/2019 PCP: Simona Huh, NP    Chief Complaint  Patient presents with  . 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  Admitted 8/18 through 09/02/2019 with a COPD she was given IV steroids and sent home with inhalers and doxycycline  She reported gradually worsening of short of breath, she has used more inhaler and nebulizer, she ran out of her nebulizer medication, presented to [facility]  Subjective:  Reports breathing is better, however continue to have back pain He thinks continuous oxygen helps her Report occasional dry cough No chest pain, no fever,  Complain of left forearm edema, report IV infiltration last night No lower extremity edema  Assessment & Plan:   Principal Problem:   Acute on chronic respiratory failure with hypoxia ([place]) Active Problems:   Tobacco abuse   Hyperlipidemia   Anxiety   COPD exacerbation ([place])   Essential hypertension   Back pain   Recurrent COPD exacerbation -Per chart review she has component of COPD and asthma together -She has significant wheezing on presentation -Wheezing has improved -She is currently on 2 L of oxygen -Continue current management  Left forearm edema: Likely IV infiltration -Elevate left arm -Consider venous Doppler ultrasound if persists  Compression fracture at T6 With significant back pain Case discussed with IR who did not think patient is a candidate for kyphoplasty Continue TLSO brace, continue as needed analgesic PT OT eval  Psych: Currently stable on home regimen, continue   Body mass index is 34.76 kg/m.   Failure to thrive: PT recommended [place] consulted    DVT prophylaxis: enoxaparin (LOVENOX)  injection 40 mg Start: 09/08/19 2200   Code Status: Full Family Communication: Patient Disposition:   Status is: Inpatient  Dispo: The patient is from: Home              Anticipated d/c is to: CIR               Anticipated d/c date is: To be determined             Needs CIR consult  Consultants:   CIR  My colleague Dr. Verlon Au discussed case with pulmonology  Procedures:   None  Antimicrobials:    Rocephin and Zithromax    Objective: Vitals:   09/09/19 2117 09/10/19 0200 09/10/19 0424 09/10/19 0915  BP:  129/85 (!) 147/82   Pulse:  96 (!) 105   Resp:  16 17   Temp:  98.7 F (37.1 C) 98.3 F (36.8 C)   TempSrc:  Oral Oral   SpO2: 96% 95% 98% 99%  Weight:      Height:        Intake/Output Summary (Last 24 hours) at 09/10/2019 1244 Last data filed at 09/10/2019 0939 Gross per 24 hour  Intake 480 ml  Output 600 ml  Net -120 ml   Filed Weights   09/08/19 1551  Weight: 89 kg    Examination:  General exam: calm, NAD Respiratory system: over all deminished , no wheezing, no rhonchi,. Respiratory effort normal. Cardiovascular system: S1 & S2 heard, RRR. No JVD, no murmur, No pedal edema. Gastrointestinal system: Abdomen is nondistended, soft and nontender. No organomegaly or masses felt. Normal bowel sounds  heard. Central nervous system: Alert and oriented. No focal neurological deficits. Extremities: left forarm edema, strength equal and intact Skin: No rashes, lesions or ulcers Psychiatry: Judgement and insight appear normal. Mood & affect appropriate.     Data Reviewed: I have personally reviewed following labs and imaging studies  CBC: Recent Labs  Lab 09/08/19 1623 09/09/19 0432 09/10/19 0430  WBC 16.8* 14.5* 18.9*  NEUTROABS  --  13.3* 15.7*  HGB 13.2 11.8* 12.1  HCT 41.7 37.5 38.0  MCV 101.7* 101.9* 101.3*  PLT 267 239 106    Basic Metabolic Panel: Recent Labs  Lab 09/08/19 1623 09/09/19 0432 09/10/19 0430  NA 138 137 138  K 4.4  4.6 4.1  CL 101 101 102  CO2 29 27 27   GLUCOSE 135* 135* 124*  BUN 19 18 20   CREATININE 0.67 0.55 0.58  CALCIUM 9.5 9.6 10.1    GFR: Estimated Creatinine Clearance: 73.2 mL/min (by C-G formula based on SCr of 0.58 mg/dL).  Liver Function Tests: Recent Labs  Lab 09/08/19 1623 09/09/19 0432 09/10/19 0430  AST 22 19 15   ALT 50* 46* 38  ALKPHOS 57 53 48  BILITOT 0.3 0.7 0.6  PROT 6.5 6.1* 6.2*  ALBUMIN 3.7 3.4* 3.5    CBG: No results for input(s): GLUCAP in the last 168 hours.   Recent Results (from the past 240 hour(s))  SARS Coronavirus 2 by RT PCR (hospital order, performed in [facility] hospital lab) Nasopharyngeal Nasopharyngeal Swab     Status: None   Collection Time: 09/08/19  4:23 PM   Specimen: Nasopharyngeal Swab  Result Value Ref Range Status   SARS Coronavirus 2 NEGATIVE NEGATIVE Final    Comment: (NOTE) SARS-CoV-2 target nucleic acids are NOT DETECTED.  The SARS-CoV-2 RNA is generally detectable in upper and lower respiratory specimens during the acute phase of infection. The lowest concentration of SARS-CoV-2 viral copies this assay can detect is 250 copies / mL. A negative result does not preclude SARS-CoV-2 infection and should not be used as the sole basis for treatment or other patient management decisions.  A negative result may occur with improper specimen collection / handling, submission of specimen other than nasopharyngeal swab, presence of viral mutation(s) within the areas targeted by this assay, and inadequate number of viral copies (<250 copies / mL). A negative result must be combined with clinical observations, patient history, and epidemiological information.  Fact Sheet for Patients:   StrictlyIdeas.no  Fact Sheet for Healthcare Providers: BankingDealers.co.za  This test is not yet approved or  cleared by the [place] FDA and has been authorized for detection and/or diagnosis of  SARS-CoV-2 by FDA under an Emergency Use Authorization (EUA).  This EUA will remain in effect (meaning this test can be used) for the duration of the COVID-19 declaration under Section 564(b)(1) of the Act, 21 U.S.C. section 360bbb-3(b)(1), unless the authorization is terminated or revoked sooner.  Performed at [facility], [place] [address] [place], [place] [address]   MRSA PCR Screening     Status: None   Collection Time: 09/10/19  2:00 AM   Specimen: Nasal Mucosa; Nasopharyngeal  Result Value Ref Range Status   MRSA by PCR NEGATIVE NEGATIVE Final    Comment:        The GeneXpert MRSA Assay (FDA approved for NASAL specimens only), is one component of a comprehensive MRSA colonization surveillance program. It is not intended to diagnose MRSA infection nor to guide or monitor treatment for MRSA infections.  Performed at [facility], [place] [address] [place], [place] [address]          Radiology Studies: CT ANGIO CHEST PE W OR WO CONTRAST  Result Date: 09/08/2019 CLINICAL DATA:  COPD exacerbation. EXAM: CT ANGIOGRAPHY CHEST WITH CONTRAST TECHNIQUE: Multidetector CT imaging of the chest was performed using the standard protocol during bolus administration of intravenous contrast. Multiplanar CT image reconstructions and MIPs were obtained to evaluate the vascular anatomy. CONTRAST:  14mL OMNIPAQUE IOHEXOL 350 MG/ML SOLN COMPARISON:  August 23, 2017 FINDINGS: Cardiovascular: Satisfactory opacification of the pulmonary arteries to the segmental level. No evidence of pulmonary embolism. Normal heart size. No pericardial effusion. Marked severity coronary artery calcification is noted Mediastinum/Nodes: No enlarged mediastinal, hilar, or axillary lymph nodes. Thyroid gland, trachea, and esophagus demonstrate no significant findings. Lungs/Pleura: Mild atelectasis is seen within the bilateral lung bases and posterior aspect of the left upper lobe.  There is no evidence of a pleural effusion or pneumothorax. Upper Abdomen: A 2.6 cm cyst is seen within the right kidney. Musculoskeletal: A compression fracture deformity of indeterminate age is seen involving the T6 vertebral body. This represents a new finding compared to the prior exam. Multilevel degenerative changes are noted throughout the remainder of the thoracic spine. Review of the MIP images confirms the above findings. IMPRESSION: 1. No evidence of pulmonary embolism. 2. Mild bibasilar and left upper lobe atelectasis. 3. Marked severity coronary artery calcification. 4. Compression fracture deformity of indeterminate age involving the T6 vertebral body. This represents a new finding compared to the prior exam. Electronically Signed   By: Virgina Norfolk M.D.   On: 09/08/2019 20:24   CT ABDOMEN PELVIS W CONTRAST  Result Date: 09/08/2019 CLINICAL DATA:  COPD exacerbation, right flank pain, low back pain EXAM: CT ABDOMEN AND PELVIS WITH CONTRAST TECHNIQUE: Multidetector CT imaging of the abdomen and pelvis was performed using the standard protocol following bolus administration of intravenous contrast. CONTRAST:  152mL OMNIPAQUE IOHEXOL 350 MG/ML SOLN COMPARISON:  None. FINDINGS: Lower chest: Scattered atelectasis or scarring at the lung bases. No acute pleural or parenchymal lung disease. Hepatobiliary: No focal liver abnormality is seen. No gallstones, gallbladder wall thickening, or biliary dilatation. Pancreas: Unremarkable. No pancreatic ductal dilatation or surrounding inflammatory changes. Spleen: Normal in size without focal abnormality. Adrenals/Urinary Tract: Small right renal cyst. Otherwise the kidneys enhance normally. No urinary tract calculi or obstructive uropathy. The adrenals and bladder are unremarkable. Stomach/Bowel: No bowel obstruction or ileus. No wall thickening or inflammatory change. The appendix, if still present, is not well visualized. There are no inflammatory changes  to suggest appendicitis. Vascular/Lymphatic: Aortic atherosclerosis. No enlarged abdominal or pelvic lymph nodes. Reproductive: Status post hysterectomy. No adnexal masses. Other: No free fluid or free gas.  No abdominal wall hernia. Musculoskeletal: There are no acute displaced fractures. There is diffuse spondylosis throughout the thoracolumbar spine. From L3/L4 through L5/S1 there is circumferential disc bulge and bilateral facet hypertrophy resulting in mild central canal stenosis and symmetrical neural foraminal encroachment. These changes are most pronounced at the L4/L5 and L5/S1 levels. Reconstructed images demonstrate no additional findings. IMPRESSION: 1. No acute intra-abdominal or intrapelvic process. 2. Prominent lower lumbar spondylosis greatest at L4/L5 and L5/S1. 3. Aortic Atherosclerosis (ICD10-I70.0). Electronically Signed   By: Randa Ngo M.D.   On: 09/08/2019 20:17   CT L-SPINE NO CHARGE  Result Date: 09/08/2019 CLINICAL DATA:  COPD exacerbation, low back pain, right flank pain EXAM: CT LUMBAR SPINE WITHOUT CONTRAST TECHNIQUE: Multidetector CT  imaging of the lumbar spine was performed without intravenous contrast administration. Multiplanar CT image reconstructions were also generated. COMPARISON:  None. FINDINGS: Segmentation: There are 5 non-rib-bearing lumbar type vertebral bodies. Alignment: Alignment is anatomic. Vertebrae: There are no acute displaced fractures. No destructive bony lesions. Paraspinal and other soft tissues: Paraspinal soft tissues are unremarkable. Atherosclerosis throughout the abdominal aorta. Disc levels: There is multilevel thoracolumbar spondylosis. Prominent anterior osteophytes are seen from T10 through L2. No significant central canal or neural foraminal encroachment. At L3/L4 there is bilateral left greater than right neural foraminal encroachment due to circumferential disc bulge and bilateral facet hypertrophy. And L4/L5 there is circumferential disc  bulge with bilateral facet hypertrophy resulting in mild central stenosis and symmetrical neural foraminal encroachment. At L5/S1 there is mild circumferential disc bulge and bilateral facet hypertrophy with mild symmetrical neural foraminal encroachment. Reconstructed images demonstrate no additional findings. IMPRESSION: 1. Multilevel thoracolumbar spondylosis, greatest from L3/L4 through L5/S1 as above. 2. No acute bony abnormality. Electronically Signed   By: Randa Ngo M.D.   On: 09/08/2019 20:49   DG Chest Port 1 View  Result Date: 09/08/2019 CLINICAL DATA:  Dyspnea and shortness of breath x2 days. EXAM: PORTABLE CHEST 1 VIEW COMPARISON:  August 27, 2019 FINDINGS: Mild atelectasis and/or infiltrate is seen within the left lung base. There is no evidence of a pleural effusion or pneumothorax. The heart size and mediastinal contours are within normal limits. Tortuosity of the descending thoracic aorta is seen. The visualized skeletal structures are unremarkable. IMPRESSION: Mild left basilar atelectasis and/or infiltrate. Electronically Signed   By: Virgina Norfolk M.D.   On: 09/08/2019 16:30   ECHOCARDIOGRAM COMPLETE  Result Date: 09/09/2019    ECHOCARDIOGRAM REPORT   Patient Name:   Julia Daniels Date of Exam: 09/09/2019 Medical Rec #:  [id number]           Height:       63.0 in Accession #:    [id number]          Weight:       196.2 lb Date of Birth:  [birth year]          BSA:          1.918 m Patient Age:    29 years            BP:           146/109 mmHg Patient Gender: F                   HR:           110 bpm. Exam Location:  Inpatient Procedure: 2D Echo, Cardiac Doppler and Color Doppler Indications:    ACUTE rESPIRATORY INSUFFICIENCY 518.82 / r06.89  History:        Patient has no prior history of Echocardiogram examinations.                 COPD.  Sonographer:    Jonelle Sidle Dance Referring Phys: [id number]  Sonographer Comments: No subcostal window. IMPRESSIONS  1. Left  ventricular ejection fraction, by estimation, is 55%. The left ventricle has normal function. The left ventricle demonstrates regional wall motion abnormalities with basal inferior akinesis. Left ventricular diastolic parameters are consistent with Grade I diastolic dysfunction (impaired relaxation).  2. Right ventricular systolic function is normal. The right ventricular size is normal. Tricuspid regurgitation signal is inadequate for assessing PA pressure.  3. The mitral valve is normal in structure. No evidence of mitral valve  regurgitation. No evidence of mitral stenosis.  4. The aortic valve is tricuspid. Aortic valve regurgitation is not visualized. No aortic stenosis is present.  5. Aortic dilatation noted. There is mild dilatation of the ascending aorta measuring 39 mm.  6. The inferior vena cava is normal in size with greater than 50% respiratory variability, suggesting right atrial pressure of 3 mmHg. FINDINGS  Left Ventricle: Left ventricular ejection fraction, by estimation, is 55%. The left ventricle has normal function. The left ventricle demonstrates regional wall motion abnormalities. The left ventricular internal cavity size was normal in size. There is  no left ventricular hypertrophy. Left ventricular diastolic parameters are consistent with Grade I diastolic dysfunction (impaired relaxation). Right Ventricle: The right ventricular size is normal. No increase in right ventricular wall thickness. Right ventricular systolic function is normal. Tricuspid regurgitation signal is inadequate for assessing PA pressure. Left Atrium: Left atrial size was normal in size. Right Atrium: Right atrial size was normal in size. Pericardium: There is no evidence of pericardial effusion. Mitral Valve: The mitral valve is normal in structure. No evidence of mitral valve regurgitation. No evidence of mitral valve stenosis. Tricuspid Valve: The tricuspid valve is normal in structure. Tricuspid valve regurgitation is  not demonstrated. Aortic Valve: The aortic valve is tricuspid. Aortic valve regurgitation is not visualized. No aortic stenosis is present. Pulmonic Valve: The pulmonic valve was normal in structure. Pulmonic valve regurgitation is not visualized. Aorta: Aortic dilatation noted. There is mild dilatation of the ascending aorta measuring 39 mm. Venous: The inferior vena cava is normal in size with greater than 50% respiratory variability, suggesting right atrial pressure of 3 mmHg. IAS/Shunts: No atrial level shunt detected by color flow Doppler.  LEFT VENTRICLE PLAX 2D LVIDd:         5.50 cm  Diastology LVIDs:         4.40 cm  LV e' lateral:   7.40 cm/s LV PW:         1.10 cm  LV E/e' lateral: 10.9 LV IVS:        1.10 cm  LV e' medial:    7.62 cm/s LVOT diam:     2.20 cm  LV E/e' medial:  10.6 LV SV:         74 LV SV Index:   39 LVOT Area:     3.80 cm  RIGHT VENTRICLE RV Basal diam:  2.80 cm RV S prime:     17.90 cm/s TAPSE (M-mode): 2.0 cm LEFT ATRIUM             Index       RIGHT ATRIUM           Index LA diam:        3.50 cm 1.82 cm/m  RA Area:     14.20 cm LA Vol (A2C):   30.1 ml 15.69 ml/m RA Volume:   36.00 ml  18.77 ml/m LA Vol (A4C):   26.7 ml 13.92 ml/m LA Biplane Vol: 30.2 ml 15.75 ml/m  AORTIC VALVE LVOT Vmax:   127.00 cm/s LVOT Vmean:  89.250 cm/s LVOT VTI:    0.194 m  AORTA Ao Root diam: 3.40 cm Ao Asc diam:  3.90 cm MITRAL VALVE MV Area (PHT): 3.60 cm     SHUNTS MV Decel Time: 211 msec     Systemic VTI:  0.19 m MV E velocity: 80.50 cm/s   Systemic Diam: 2.20 cm MV A velocity: 130.00 cm/s MV E/A ratio:  0.62 [employer]  MD Electronically signed by Loralie Champagne MD Signature Date/Time: 09/09/2019/4:52:45 PM    Final         Scheduled Meds: . baclofen  10 mg Oral TID  . doxycycline  100 mg Oral Q12H  . enoxaparin (LOVENOX) injection  40 mg Subcutaneous Q24H  . famotidine  20 mg Oral QHS  . ipratropium-albuterol  3 mL Nebulization Q6H  . lamoTRIgine  200 mg Oral QHS  . loratadine  10  mg Oral QPM  . methylphenidate  72 mg Oral BH-q7a  . mometasone-formoterol  2 puff Inhalation BID  . montelukast  10 mg Oral QHS  . pantoprazole  40 mg Oral Daily  . predniSONE  40 mg Oral Q breakfast  . QUEtiapine  150 mg Oral QHS  . risperiDONE  3 mg Oral QHS  . umeclidinium bromide  1 puff Inhalation Daily  . venlafaxine XR  150 mg Oral QHS  . [START ON 09/13/2019] Vitamin D (Ergocalciferol)  50,000 Units Oral Q Sat   Continuous Infusions: . sodium chloride 50 mL/hr at 09/10/19 0203     LOS: 2 days   Time spent: 2mins Greater than 50% of this time was spent in counseling, explanation of diagnosis, planning of further management, and coordination of care.  I have personally reviewed and interpreted on  09/10/2019 daily labs, tele strips, imagings as discussed above under date review session and assessment and plans.  I reviewed all nursing notes, pharmacy notes, consultant notes,  vitals, pertinent old records  I have discussed plan of care as described above with RN , patient  on 09/10/2019  Voice Recognition /Dragon dictation system was used to create this note, attempts have been made to correct errors. Please contact the author with questions and/or clarifications.   Florencia Reasons, MD PhD FACP [facility]  Available via Epic secure chat 7am-7pm for nonurgent issues Please page for urgent issues To page the attending provider between 7A-7P or the covering provider during after hours 7P-7A, please log into the web site [email or web address] and access using universal [place] password for that web site. If you do not have the password, please call the hospital operator.    09/10/2019, 12:44 PM

## 2019-09-10 NOTE — Progress Notes (Signed)
Nutrition Brief Note  RD consulted via COPD protocol. Pt with stable weight.    Wt Readings from Last 15 Encounters:  09/08/19 89 kg  08/27/19 88.9 kg  07/25/19 87.7 kg  06/27/19 83.9 kg  12/27/18 87.5 kg  10/03/18 90.3 kg  07/03/18 93.4 kg  05/24/18 92.5 kg  03/27/18 91.2 kg  03/14/18 91.4 kg  03/07/18 90 kg  02/11/18 89.6 kg  01/31/18 87.3 kg  01/18/18 90.6 kg  12/20/17 86.8 kg    Body mass index is 34.76 kg/m. Patient meets criteria for obesity based on current BMI.   Current diet order is heart healthy, patient is consuming approximately 100% of meals at this time. Labs and medications reviewed.   No nutrition interventions warranted at this time. If nutrition issues arise, please consult RD.   Julia Ina, MS, RD, LDN RD pager number and weekend/on-call pager number located in [facility] [place].

## 2019-09-10 NOTE — Progress Notes (Signed)
Occupational Therapy Evaluation  Patient with functional deficits listed below impacting safety and independence with self care. Patient familiar to this therapist from previous hospitalization. Patient reports "I just couldn't do it at home" and is highly motivated to get stronger and improve endurance so she can get back to work. Patient min G with functional transfer to bedside commode min G for OOB utilizing log roll technique and mod A to return to bed to lift LEs onto EOB. Set up in sitting to perform g/h combing hair. Patient with limited activity tolerance and requesting back to bed due to back pain. Recommend D/C to venue listed below to maximize patient safety and independence with self care and ultimately reaching patient's goal of getting back to work.    09/10/19 1400  OT Visit Information  Last OT Received On 09/10/19  Assistance Needed +1  History of Present Illness Julia Daniels is a 69 y.o. year-old female with a history of COPD presenting to the ED8/30/  with chief complaint of shortness of breath.Admitted 8/18 through 09/02/2019 with a COPD exacerbation. Noted T6 compression fracture to be treated with TLSO or vertebroplasty  Precautions  Precautions Fall  Precaution Comments new T 6 comp Fx and COPD (home O2 at night/PRN day)  Required Braces or Orthoses Spinal Brace  Spinal Brace TLSO;Applied in sitting position  Restrictions  Other Position/Activity Restrictions non specific for when to don/doff TLSO  Home Living  Family/patient expects to be discharged to: Private residence  Living Arrangements Children  Available Help at Discharge Family;Available PRN/intermittently  Type of Home House  Home Access Stairs to enter  Entrance Stairs-Number of Steps 4  Entrance Stairs-Rails Right  Home Layout One level  Bathroom Therapist, music None  Prior Function  Level of Independence Independent  Comments 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, petroleum No difficulties  Pain Assessment  Pain Assessment Faces  Faces Pain Scale 8  Pain Location mid thoracic back with mobility   Pain Descriptors / Indicators Aching;Guarding;Grimacing  Pain Intervention(s) Monitored during session  Cognition  Arousal/Alertness Awake/alert  Behavior During Therapy WFL for tasks assessed/performed  Overall Cognitive Status Within Functional Limits for tasks assessed  Upper Extremity Assessment  Upper Extremity Assessment Generalized weakness  Lower Extremity Assessment  Lower Extremity Assessment Defer to PT evaluation  ADL  Overall ADL's  Needs assistance/impaired  Eating/Feeding Independent  Grooming Set up;Sitting  Upper Body Bathing Set up;Sitting  Lower Body Bathing Sit to/from stand;Moderate assistance  Upper Body Dressing  Set up;Sitting  Lower Body Dressing Moderate assistance;Sitting/lateral leans  Lower Body Dressing Details (indicate cue type and reason) significant back pain  Toilet Transfer Stand-pivot;BSC;Min Training and development officer Details (indicate cue type and reason) decreased activity tolerance due to back pain and dyspnea with exertion   Toileting- Clothing Manipulation and Hygiene Set up;Sitting/lateral lean  Functional mobility during ADLs Min guard  General ADL Comments patient limited by activity tolerance, strength, back pain requiring increased assistance with self care  Bed Mobility  Overal bed mobility Needs Assistance  Bed Mobility Rolling;Sidelying to Sit;Sit to Sidelying;Sit to Supine  Rolling Min guard  Sidelying to sit Min guard;HOB elevated  Sit to sidelying Mod assist  General bed mobility comments patient familiar with log roll technique, requiring assist when returning to supine to lift LEs back onto bed. patient reports bed mobility is easier this time due to medicated for pain  Transfers  Overall transfer  level Needs assistance  Equipment used None   Transfers Stand Pivot Transfers;Sit to/from Stand  Sit to [employer] guard  Stand pivot transfers Min guard  General transfer comment min guard for safety due to decreased stability and activity tolerance  Balance  Overall balance assessment Needs assistance  Sitting-balance support Feet supported  Sitting balance-Leahy Scale Fair  Standing balance support Single extremity supported  Standing balance-Leahy Scale Fair  Standing balance comment can stand statically without support, holds onto commode during stand pivot  OT - End of Session  Equipment Utilized During Treatment Oxygen  Activity Tolerance Patient tolerated treatment well  Patient left in bed;with call bell/phone within reach;with bed alarm set  Nurse Communication Mobility status  OT Assessment  OT Recommendation/Assessment Patient needs continued OT Services  OT Visit Diagnosis Pain  Pain - part of body  (back)  OT Problem List Decreased activity tolerance;Cardiopulmonary status limiting activity;Pain  OT Plan  OT Frequency (ACUTE ONLY) Min 2X/week  OT Treatment/Interventions (ACUTE ONLY) Self-care/ADL training;Therapeutic exercise;Energy conservation;DME and/or AE instruction;Therapeutic activities;Patient/family education;Balance training  AM-PAC OT "6 Clicks" Daily Activity Outcome Measure (Version 2)  Help from another person eating meals? 4  Help from another person taking care of personal grooming? 3  Help from another person toileting, which includes using toliet, bedpan, or urinal? 3  Help from another person bathing (including washing, rinsing, drying)? 2  Help from another person to put on and taking off regular upper body clothing? 3  Help from another person to put on and taking off regular lower body clothing? 2  6 Click Score 17  OT Recommendation  Recommendations for Other Services Rehab consult  Follow Up Recommendations CIR  OT Equipment Tub/shower seat  Individuals Consulted  Consulted and Agree  with Results and Recommendations Patient  Acute Rehab OT Goals  Patient Stated Goal agreeable to going to rehab "I want to have strength to get back to work"  OT Goal Formulation With patient  Time For Goal Achievement 09/24/19  Potential to Achieve Goals Good  OT Time Calculation  OT Start Time (ACUTE ONLY) 1341  OT Stop Time (ACUTE ONLY) 1357  OT Time Calculation (min) 16 min  OT General Charges  $OT Visit 1 Visit  OT Evaluation  $OT Eval Low Complexity 1 Low  Written Expression  Dominant Hand Right   Delbert Phenix OT OT pager: [phone]

## 2019-09-10 NOTE — Progress Notes (Signed)
Physical Therapy Treatment Patient Details Name: KIMBERLEE SHOUN MRN: [id number] DOB: [birth year] Today's Date: 09/10/2019    History of Present Illness Julia Daniels is a 70 y.o. year-old female with a history of COPD presenting to the ED8/30/  with chief complaint of shortness of breath.Admitted 8/18 through 09/02/2019 with a COPD exacerbation. Noted T6 compression fracture to be treated with TLSO or vertebroplasty    PT Comments    Pt in bed wearing TLSO and insisted "he told me to wear it all the time".  Re educated pt on back brace use and time on/time off.  Also assisted with educating pt on proper donning/doffing.  Assisted OOB to amb to bathroom.  General bed mobility comments: attempted to teach "log rolling" however pt had difficulty due to pain level and anxiety.  General transfer comment: from elevated bed and also toilet transfer with 50% VC's on proper hand placement.  General Gait Details: pt was only able to amb to and from bathroom due to back pain despite TLSO applied.  Assisted with hygiene in bathroom as pt was unable to self perform due to back pain.  Pt also present with 3/4 dyspnea and remained on 4 lts to achieve sats > 90%.  Prior at home pt was on oxygen at night and PRN during day.  Assisted back to bed as pt was unable to tolerate sitting any longer.  Removed TLSO and positioned to comfort.   Pt lives home with son who works.  Pt would benefit from CIR prior to returning home to regain her level of mobility.    Follow Up Recommendations  CIR     Equipment Recommendations       Recommendations for Other Services       Precautions / Restrictions Precautions Precautions: Fall Precaution Comments: new T 6 comp Fx and COPD (home O2 at night/PRN day) Required Braces or Orthoses: Spinal Brace Spinal Brace: Thoracolumbosacral orthotic;Applied in sitting position Restrictions Weight Bearing Restrictions: No    Mobility  Bed Mobility Overal bed mobility:  Needs Assistance Bed Mobility: Rolling;Sidelying to Sit;Sit to Sidelying;Sit to Supine Rolling: Min assist Sidelying to sit: Mod assist     Sit to sidelying: Mod assist;Max assist General bed mobility comments: attempted to teach "log rolling" however pt had difficulty due to pain level and anxiety  Transfers Overall transfer level: Needs assistance Equipment used: Rolling walker (2 wheeled) Transfers: Sit to/from Stand Sit to Stand: Min assist Stand pivot transfers: Min assist;Mod assist       General transfer comment: from elevated bed and also toilet transfer with 50% VC's on proper hand placement  Ambulation/Gait Ambulation/Gait assistance: Supervision;Min guard Gait Distance (Feet): 25 Feet (to and from bathroom only) Assistive device: Rolling walker (2 wheeled) Gait Pattern/deviations: Step-through pattern;Decreased stride length Gait velocity: decr   General Gait Details: pt was only able to amb to and from bathroom due to back pain despite TLSO applied   Stairs             Wheelchair Mobility    Modified Rankin (Stroke Patients Only)       Balance                                            Cognition Arousal/Alertness: Awake/alert Behavior During Therapy: Anxious;Restless Overall Cognitive Status: Within Functional Limits for tasks assessed  General Comments: AxO x 3 and over come with back pain and worry about her current condition and how she is going to be able to keep working      Exercises      General Comments        Pertinent Vitals/Pain Pain Assessment: 0-10 Pain Score: 8  Pain Location: mid thoracic back with  mobility even with back brace on Pain Descriptors / Indicators: Aching;Moaning;Crying;Grimacing;Guarding Pain Intervention(s): Monitored during session;Patient requesting pain meds-RN notified    Home Living                      Prior Function             PT Goals (current goals can now be found in the care plan section) Progress towards PT goals: Progressing toward goals    Frequency    Min 3X/week      PT Plan Current plan remains appropriate    Co-evaluation              AM-PAC PT "6 Clicks" Mobility   Outcome Measure  Help needed turning from your back to your side while in a flat bed without using bedrails?: A Lot Help needed moving from lying on your back to sitting on the side of a flat bed without using bedrails?: A Lot Help needed moving to and from a bed to a chair (including a wheelchair)?: A Lot Help needed standing up from a chair using your arms (e.g., wheelchair or bedside chair)?: A Lot Help needed to walk in hospital room?: A Lot Help needed climbing 3-5 steps with a railing? : Total 6 Click Score: 11    End of Session Equipment Utilized During Treatment: Gait belt Activity Tolerance: Patient limited by fatigue;Patient limited by pain Patient left: in bed;with call bell/phone within reach Nurse Communication: Mobility status;Patient requests pain meds PT Visit Diagnosis: Difficulty in walking, not elsewhere classified (R26.2)     Time: 4562-5638 PT Time Calculation (min) (ACUTE ONLY): 41 min  Charges:  $Gait Training: 8-22 mins $Therapeutic Activity: 23-37 mins                     Rica Koyanagi  PTA Acute  Rehabilitation Services Pager      [phone] Office      [phone]

## 2019-09-10 NOTE — Telephone Encounter (Signed)
Rx for albuterol neb sol was refilled  I spoke with the the pt and notified her that this was done

## 2019-09-10 NOTE — Progress Notes (Signed)
IR requested by Dr. Erlinda Hong for possible image-guided T6 kyphoplasty/vertebroplasty.  Case/images have been reviewed by Dr. Estanislado Pandy who states compression deformity is too severe to allow safe delivery of cement (methyl methacrylate) into bone (T6 with both inferior and superior end plate deformities). Because of this he does not recommend proceeding with kyphoplasty/vertebroplasty. Recommend continued conservative management (pain control, PT). No plans for IR intervention at this time- will delete order. Dr. Erlinda Hong made aware.  IR available in future if needed.   Bea Graff Jazziel Fitzsimmons, PA-C 09/10/2019, 3:48 PM

## 2019-09-11 LAB — CBC WITH DIFFERENTIAL/PLATELET
Abs Immature Granulocytes: 0.15 10*3/uL — ABNORMAL HIGH (ref 0.00–0.07)
Basophils Absolute: 0 10*3/uL (ref 0.0–0.1)
Basophils Relative: 0 %
Eosinophils Absolute: 0 10*3/uL (ref 0.0–0.5)
Eosinophils Relative: 0 %
HCT: 39 % (ref 36.0–46.0)
Hemoglobin: 12.3 g/dL (ref 12.0–15.0)
Immature Granulocytes: 1 %
Lymphocytes Relative: 22 %
Lymphs Abs: 2.8 10*3/uL (ref 0.7–4.0)
MCH: 32.7 pg (ref 26.0–34.0)
MCHC: 31.5 g/dL (ref 30.0–36.0)
MCV: 103.7 fL — ABNORMAL HIGH (ref 80.0–100.0)
Monocytes Absolute: 1.1 10*3/uL — ABNORMAL HIGH (ref 0.1–1.0)
Monocytes Relative: 9 %
Neutro Abs: 8.6 10*3/uL — ABNORMAL HIGH (ref 1.7–7.7)
Neutrophils Relative %: 68 %
Platelets: 246 10*3/uL (ref 150–400)
RBC: 3.76 MIL/uL — ABNORMAL LOW (ref 3.87–5.11)
RDW: 14.6 % (ref 11.5–15.5)
WBC: 12.7 10*3/uL — ABNORMAL HIGH (ref 4.0–10.5)
nRBC: 0 % (ref 0.0–0.2)

## 2019-09-11 LAB — COMPREHENSIVE METABOLIC PANEL
ALT: 29 U/L (ref 0–44)
AST: 12 U/L — ABNORMAL LOW (ref 15–41)
Albumin: 3.3 g/dL — ABNORMAL LOW (ref 3.5–5.0)
Alkaline Phosphatase: 48 U/L (ref 38–126)
Anion gap: 5 (ref 5–15)
BUN: 19 mg/dL (ref 8–23)
CO2: 30 mmol/L (ref 22–32)
Calcium: 9.6 mg/dL (ref 8.9–10.3)
Chloride: 105 mmol/L (ref 98–111)
Creatinine, Ser: 0.68 mg/dL (ref 0.44–1.00)
GFR calc Af Amer: >60 mL/min (ref >60)
GFR calc non Af Amer: >60 mL/min (ref >60)
Glucose, Bld: 104 mg/dL — ABNORMAL HIGH (ref 70–99)
Potassium: 4.2 mmol/L (ref 3.5–5.1)
Sodium: 140 mmol/L (ref 135–145)
Total Bilirubin: 0.3 mg/dL (ref 0.3–1.2)
Total Protein: 5.8 g/dL — ABNORMAL LOW (ref 6.5–8.1)

## 2019-09-11 MED ORDER — LIDOCAINE 5 % EX PTCH
1.0000 | MEDICATED_PATCH | CUTANEOUS | Status: DC
  Administered 2019-09-11: 1 via TRANSDERMAL
  Filled 2019-09-11 (×3): qty 1

## 2019-09-11 MED ORDER — GUAIFENESIN ER 600 MG PO TB12
600.0000 mg | ORAL_TABLET | Freq: Two times a day (BID) | ORAL | Status: DC
  Administered 2019-09-11 – 2019-09-12 (×2): 600 mg via ORAL
  Filled 2019-09-11 (×2): qty 1

## 2019-09-11 MED ORDER — SENNOSIDES-DOCUSATE SODIUM 8.6-50 MG PO TABS
1.0000 | ORAL_TABLET | Freq: Two times a day (BID) | ORAL | Status: DC
  Administered 2019-09-11 – 2019-09-12 (×2): 1 via ORAL
  Filled 2019-09-11 (×2): qty 1

## 2019-09-11 NOTE — TOC Progression Note (Signed)
Transition of Care [facility]) - Progression Note    Patient Details  Name: Julia Daniels MRN: [id number] Date of Birth: 12-21-1950  [facility]) CM/SW Contact  Purcell Mouton, RN Phone Number: 09/11/2019, 1:37 PM  Clinical Narrative:    CIR will eval pt tomorrow Friday 09/12/19.    Expected Discharge Plan: [place] Barriers to Discharge: No Barriers Identified  Expected Discharge Plan and Services Expected Discharge Plan: [place] arrangements for the past 2 months: Single Family Home Expected Discharge Date:  (unknown)                                     Social Determinants of Health (SDOH) Interventions    Readmission Risk Interventions No flowsheet data found.

## 2019-09-11 NOTE — Plan of Care (Signed)
  Problem: Education: Goal: Knowledge of General Education information will improve Description: Including pain rating scale, medication(s)/side effects and non-pharmacologic comfort measures Outcome: Progressing   Problem: Clinical Measurements: Goal: Diagnostic test results will improve Outcome: Progressing Goal: Respiratory complications will improve Outcome: Progressing Goal: Cardiovascular complication will be avoided Outcome: Progressing

## 2019-09-11 NOTE — Progress Notes (Signed)
PROGRESS NOTE    Julia Daniels  OFB:[id number] DOB: October 04, 1950 DOA: 09/08/2019 PCP: Simona Huh, NP    Chief Complaint  Patient presents with  . 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  Admitted 8/18 through 09/02/2019 with a COPD she was given IV steroids and sent home with inhalers and doxycycline  She reported gradually worsening of short of breath, she has used more inhaler and nebulizer, she ran out of her nebulizer medication, presented to [place] [facility] ED  Subjective:   continue to have back pain, states prn analgesics and TLSO helped No bm for the last two days  Report plan is looser and easier to cough up, denies chest pain, sHe thinks continuous oxygen helps her no fever,   left forearm edema has improved No lower extremity edema  She is motivated to go to CIR to get better so she can return back to  Work.  Assessment & Plan:   Principal Problem:   Acute on chronic respiratory failure with hypoxia (HCC) Active Problems:   Tobacco abuse   Hyperlipidemia   Anxiety   COPD exacerbation (HCC)   Essential hypertension   Back pain   Recurrent COPD exacerbation, chronic hypoxic respiratory failure on home O2 as needed -Per chart review she has component of COPD and asthma together, on on Fasenra injection q8weeks, next dose scheduled on 10/4, her pulmonologist is Dr. Melvyn Novas -She has significant wheezing on presentation,  -CTA chest "No evidence of pulmonary embolism.  Mild bibasilar and left upper lobe atelectasis. -She initially received IV steroid , currently on oral steroid , - Zithromax x1 on August 30 then doxycycline 100 mg twice a day -Scheduled nebulizer -wheezing has improved -She is currently on 2 L of oxygen -Continue current management, schedule  Mucinex, add on flutter valve to assist expectoration, increase activity  Left forearm edema: Likely IV infiltration -Elevate left arm, seems is improving  -venous Doppler ultrasound to rule out DVT  Compression fracture at T6 With significant back pain Case discussed with IR who did not think patient is a candidate for kyphoplasty Continue TLSO brace, continue as needed analgesic PT OT eval  Mild elevation of LFT ALT 50 on presentation, otherwise normal CT abdomen pelvis on presentation "hepatobiliary: No focal liver abnormality is seen. No gallstones, gallbladder wall thickening, or biliary dilatation" LFT has normalized    Marked severity coronary artery calcification Incidental finding on CTA Denies chest pain Will check lipid panel F/u with pcp  Psych: Currently stable on home regimen, continue   Body mass index is 34.76 kg/m.   Failure to thrive: PT recommended CIR Patient is motivated to go to CIR to get better so she can go back to work    DVT prophylaxis: enoxaparin (LOVENOX) injection 40 mg Start: 09/08/19 2200   Code Status: Full Family Communication: Patient Disposition:   Status is: Inpatient  Dispo: The patient is from: Home              Anticipated d/c is to: CIR               Anticipated d/c date is: To be determined             Needs CIR consult  Consultants:   CIR  My colleague Dr. Verlon Au discussed case  with pulmonology  Procedures:   None  Antimicrobials:     Zithromax x1 on August 30 then doxycycline 100 mg twice a day     Objective: Vitals:   09/10/19 2059 09/11/19 0550 09/11/19 0926 09/11/19 1237  BP: (!) 139/97 (!) 154/104  112/85  Pulse: 96 99  (!) 107  Resp: 20 18  18   Temp:  98.3 F (36.8 C)    TempSrc: Oral Oral    SpO2: 95% 97% 95% 96%  Weight:      Height:        Intake/Output Summary (Last 24 hours) at 09/11/2019 1739 Last data filed at 09/11/2019 1500 Gross per 24 hour  Intake 1200 ml  Output 3300 ml   Net -2100 ml   Filed Weights   09/08/19 1551  Weight: 89 kg    Examination:  General exam: calm, NAD Respiratory system: Bilateral mild wheezing, + rhonchi,. Respiratory effort normal. Cardiovascular system: S1 & S2 heard, RRR. No JVD, no murmur, No pedal edema. Gastrointestinal system: Abdomen is nondistended, soft and nontender. No organomegaly or masses felt. Normal bowel sounds heard. Central nervous system: Alert and oriented. No focal neurological deficits. Extremities: left forarm edema, strength equal and intact Skin: No rashes, lesions or ulcers Psychiatry: Judgement and insight appear normal. Mood & affect appropriate.     Data Reviewed: I have personally reviewed following labs and imaging studies  CBC: Recent Labs  Lab 09/08/19 1623 09/09/19 0432 09/10/19 0430 09/11/19 0448  WBC 16.8* 14.5* 18.9* 12.7*  NEUTROABS  --  13.3* 15.7* 8.6*  HGB 13.2 11.8* 12.1 12.3  HCT 41.7 37.5 38.0 39.0  MCV 101.7* 101.9* 101.3* 103.7*  PLT 267 239 285 751    Basic Metabolic Panel: Recent Labs  Lab 09/08/19 1623 09/09/19 0432 09/10/19 0430 09/11/19 0448  NA 138 137 138 140  K 4.4 4.6 4.1 4.2  CL 101 101 102 105  CO2 29 27 27 30   GLUCOSE 135* 135* 124* 104*  BUN 19 18 20 19   CREATININE 0.67 0.55 0.58 0.68  CALCIUM 9.5 9.6 10.1 9.6    GFR: Estimated Creatinine Clearance: 73.2 mL/min (by C-G formula based on SCr of 0.68 mg/dL).  Liver Function Tests: Recent Labs  Lab 09/08/19 1623 09/09/19 0432 09/10/19 0430 09/11/19 0448  AST 22 19 15  12*  ALT 50* 46* 38 29  ALKPHOS 57 53 48 48  BILITOT 0.3 0.7 0.6 0.3  PROT 6.5 6.1* 6.2* 5.8*  ALBUMIN 3.7 3.4* 3.5 3.3*    CBG: No results for input(s): GLUCAP in the last 168 hours.   Recent Results (from the past 240 hour(s))  SARS Coronavirus 2 by RT PCR (hospital order, performed in [facility] hospital lab) Nasopharyngeal Nasopharyngeal Swab     Status: None   Collection Time: 09/08/19  4:23 PM   Specimen:  Nasopharyngeal Swab  Result Value Ref Range Status   SARS Coronavirus 2 NEGATIVE NEGATIVE Final    Comment: (NOTE) SARS-CoV-2 target nucleic acids are NOT DETECTED.  The SARS-CoV-2 RNA is generally detectable in upper and lower respiratory specimens during the acute phase of infection. The lowest concentration of SARS-CoV-2 viral copies this assay can detect is 250 copies / mL. A negative result does not preclude SARS-CoV-2 infection and should not be used as the sole basis for treatment or other patient management decisions.  A negative result may occur with improper specimen collection / handling, submission of specimen other than nasopharyngeal swab, presence of viral mutation(s) within the areas  targeted by this assay, and inadequate number of viral copies (<250 copies / mL). A negative result must be combined with clinical observations, patient history, and epidemiological information.  Fact Sheet for Patients:   StrictlyIdeas.no  Fact Sheet for Healthcare Providers: BankingDealers.co.za  This test is not yet approved or  cleared by the [place] FDA and has been authorized for detection and/or diagnosis of SARS-CoV-2 by FDA under an Emergency Use Authorization (EUA).  This EUA will remain in effect (meaning this test can be used) for the duration of the COVID-19 declaration under Section 564(b)(1) of the Act, 21 U.S.C. section 360bbb-3(b)(1), unless the authorization is terminated or revoked sooner.  Performed at [facility], [place] [address] [place], [place] [address]   MRSA PCR Screening     Status: None   Collection Time: 09/10/19  2:00 AM   Specimen: Nasal Mucosa; Nasopharyngeal  Result Value Ref Range Status   MRSA by PCR NEGATIVE NEGATIVE Final    Comment:        The GeneXpert MRSA Assay (FDA approved for NASAL specimens only), is one component of a comprehensive MRSA  colonization surveillance program. It is not intended to diagnose MRSA infection nor to guide or monitor treatment for MRSA infections. Performed at [facility], [place] [address], [place], [place] [address]          Radiology Studies: No results found.      Scheduled Meds: . baclofen  10 mg Oral TID  . doxycycline  100 mg Oral Q12H  . enoxaparin (LOVENOX) injection  40 mg Subcutaneous Q24H  . famotidine  20 mg Oral QHS  . guaiFENesin  600 mg Oral BID  . ipratropium-albuterol  3 mL Nebulization TID  . lamoTRIgine  200 mg Oral QHS  . lidocaine  1 patch Transdermal Q24H  . loratadine  10 mg Oral QPM  . methylphenidate  72 mg Oral BH-q7a  . mometasone-formoterol  2 puff Inhalation BID  . montelukast  10 mg Oral QHS  . pantoprazole  40 mg Oral Daily  . predniSONE  40 mg Oral Q breakfast  . QUEtiapine  150 mg Oral QHS  . risperiDONE  3 mg Oral QHS  . senna-docusate  1 tablet Oral BID  . venlafaxine XR  150 mg Oral QHS  . [START ON 09/13/2019] Vitamin D (Ergocalciferol)  50,000 Units Oral Q Sat   Continuous Infusions:    LOS: 3 days   Time spent: 23mins Greater than 50% of this time was spent in counseling, explanation of diagnosis, planning of further management, and coordination of care.  I have personally reviewed and interpreted on  09/11/2019 daily labs, imagings as discussed above under date review session and assessment and plans.  I reviewed all nursing notes, pharmacy notes, consultant notes,  vitals, pertinent old records  I have discussed plan of care as described above with RN , patient  on 09/11/2019  Voice Recognition /Dragon dictation system was used to create this note, attempts have been made to correct errors. Please contact the author with questions and/or clarifications.   Florencia Reasons, MD PhD FACP [facility]  Available via Epic secure chat 7am-7pm for nonurgent issues Please page for urgent issues To page the attending  provider between 7A-7P or the covering provider during after hours 7P-7A, please log into the web site [email or web address] and access using universal [place] password for that web site. If you do not have the password, please call the hospital operator.  09/11/2019, 5:39 PM

## 2019-09-12 ENCOUNTER — Inpatient Hospital Stay (INDEPENDENT_AMBULATORY_CARE_PROVIDER_SITE_OTHER): Payer: BC Managed Care – PPO

## 2019-09-12 ENCOUNTER — Encounter (HOSPITAL_COMMUNITY): Payer: Self-pay | Admitting: Physical Medicine and Rehabilitation

## 2019-09-12 ENCOUNTER — Other Ambulatory Visit: Payer: Self-pay

## 2019-09-12 ENCOUNTER — Ambulatory Visit: Payer: BC Managed Care – PPO | Admitting: Internal Medicine

## 2019-09-12 ENCOUNTER — Inpatient Hospital Stay (HOSPITAL_COMMUNITY)
Admission: RE | Admit: 2019-09-12 | Discharge: 2019-09-28 | DRG: 945 | Disposition: A | Discharge status: Other Acute Inpatient Hospital | Payer: BC Managed Care – PPO | Source: Other Acute Inpatient Hospital | Attending: Physical Medicine and Rehabilitation | Admitting: Physical Medicine and Rehabilitation

## 2019-09-12 DIAGNOSIS — J449 Chronic obstructive pulmonary disease, unspecified: Secondary | ICD-10-CM | POA: Diagnosis present

## 2019-09-12 DIAGNOSIS — G8929 Other chronic pain: Secondary | ICD-10-CM | POA: Diagnosis not present

## 2019-09-12 DIAGNOSIS — F329 Major depressive disorder, single episode, unspecified: Secondary | ICD-10-CM | POA: Diagnosis present

## 2019-09-12 DIAGNOSIS — E669 Obesity, unspecified: Secondary | ICD-10-CM | POA: Diagnosis present

## 2019-09-12 DIAGNOSIS — R52 Pain, unspecified: Secondary | ICD-10-CM

## 2019-09-12 DIAGNOSIS — R0602 Shortness of breath: Secondary | ICD-10-CM

## 2019-09-12 DIAGNOSIS — R5381 Other malaise: Secondary | ICD-10-CM | POA: Diagnosis not present

## 2019-09-12 DIAGNOSIS — J9601 Acute respiratory failure with hypoxia: Secondary | ICD-10-CM

## 2019-09-12 DIAGNOSIS — D72829 Elevated white blood cell count, unspecified: Secondary | ICD-10-CM

## 2019-09-12 DIAGNOSIS — K5903 Drug induced constipation: Secondary | ICD-10-CM

## 2019-09-12 DIAGNOSIS — Z79899 Other long term (current) drug therapy: Secondary | ICD-10-CM | POA: Diagnosis not present

## 2019-09-12 DIAGNOSIS — R609 Edema, unspecified: Secondary | ICD-10-CM

## 2019-09-12 DIAGNOSIS — J441 Chronic obstructive pulmonary disease with (acute) exacerbation: Secondary | ICD-10-CM | POA: Diagnosis not present

## 2019-09-12 DIAGNOSIS — M4854XA Collapsed vertebra, not elsewhere classified, thoracic region, initial encounter for fracture: Secondary | ICD-10-CM | POA: Diagnosis not present

## 2019-09-12 DIAGNOSIS — F411 Generalized anxiety disorder: Secondary | ICD-10-CM

## 2019-09-12 DIAGNOSIS — Z90710 Acquired absence of both cervix and uterus: Secondary | ICD-10-CM

## 2019-09-12 DIAGNOSIS — Z87891 Personal history of nicotine dependence: Secondary | ICD-10-CM | POA: Diagnosis not present

## 2019-09-12 DIAGNOSIS — S22050A Wedge compression fracture of T5-T6 vertebra, initial encounter for closed fracture: Secondary | ICD-10-CM | POA: Diagnosis not present

## 2019-09-12 DIAGNOSIS — T380X5A Adverse effect of glucocorticoids and synthetic analogues, initial encounter: Secondary | ICD-10-CM | POA: Diagnosis not present

## 2019-09-12 DIAGNOSIS — M5134 Other intervertebral disc degeneration, thoracic region: Secondary | ICD-10-CM | POA: Diagnosis not present

## 2019-09-12 DIAGNOSIS — R069 Unspecified abnormalities of breathing: Secondary | ICD-10-CM | POA: Diagnosis not present

## 2019-09-12 DIAGNOSIS — Z7951 Long term (current) use of inhaled steroids: Secondary | ICD-10-CM | POA: Diagnosis not present

## 2019-09-12 DIAGNOSIS — M62838 Other muscle spasm: Secondary | ICD-10-CM | POA: Diagnosis present

## 2019-09-12 DIAGNOSIS — F419 Anxiety disorder, unspecified: Secondary | ICD-10-CM | POA: Diagnosis present

## 2019-09-12 DIAGNOSIS — J9621 Acute and chronic respiratory failure with hypoxia: Secondary | ICD-10-CM | POA: Diagnosis present

## 2019-09-12 DIAGNOSIS — K219 Gastro-esophageal reflux disease without esophagitis: Secondary | ICD-10-CM | POA: Diagnosis present

## 2019-09-12 DIAGNOSIS — M545 Low back pain, unspecified: Secondary | ICD-10-CM

## 2019-09-12 DIAGNOSIS — J9602 Acute respiratory failure with hypercapnia: Secondary | ICD-10-CM | POA: Diagnosis not present

## 2019-09-12 DIAGNOSIS — Z9981 Dependence on supplemental oxygen: Secondary | ICD-10-CM | POA: Diagnosis not present

## 2019-09-12 LAB — COMPREHENSIVE METABOLIC PANEL
ALT: 25 U/L (ref 0–44)
AST: 12 U/L — ABNORMAL LOW (ref 15–41)
Albumin: 3 g/dL — ABNORMAL LOW (ref 3.5–5.0)
Alkaline Phosphatase: 46 U/L (ref 38–126)
Anion gap: 9 (ref 5–15)
BUN: 15 mg/dL (ref 8–23)
CO2: 29 mmol/L (ref 22–32)
Calcium: 9.4 mg/dL (ref 8.9–10.3)
Chloride: 100 mmol/L (ref 98–111)
Creatinine, Ser: 0.77 mg/dL (ref 0.44–1.00)
GFR calc Af Amer: >60 mL/min (ref >60)
GFR calc non Af Amer: >60 mL/min (ref >60)
Glucose, Bld: 103 mg/dL — ABNORMAL HIGH (ref 70–99)
Potassium: 3.8 mmol/L (ref 3.5–5.1)
Sodium: 138 mmol/L (ref 135–145)
Total Bilirubin: 0.4 mg/dL (ref 0.3–1.2)
Total Protein: 5.4 g/dL — ABNORMAL LOW (ref 6.5–8.1)

## 2019-09-12 LAB — MAGNESIUM: Magnesium: 2.1 mg/dL (ref 1.7–2.4)

## 2019-09-12 LAB — CBC WITH DIFFERENTIAL/PLATELET
Abs Immature Granulocytes: 0.16 10*3/uL — ABNORMAL HIGH (ref 0.00–0.07)
Basophils Absolute: 0 10*3/uL (ref 0.0–0.1)
Basophils Relative: 0 %
Eosinophils Absolute: 0 10*3/uL (ref 0.0–0.5)
Eosinophils Relative: 0 %
HCT: 38.7 % (ref 36.0–46.0)
Hemoglobin: 12.2 g/dL (ref 12.0–15.0)
Immature Granulocytes: 1 %
Lymphocytes Relative: 23 %
Lymphs Abs: 2.8 10*3/uL (ref 0.7–4.0)
MCH: 32.4 pg (ref 26.0–34.0)
MCHC: 31.5 g/dL (ref 30.0–36.0)
MCV: 102.7 fL — ABNORMAL HIGH (ref 80.0–100.0)
Monocytes Absolute: 1 10*3/uL (ref 0.1–1.0)
Monocytes Relative: 8 %
Neutro Abs: 8.2 10*3/uL — ABNORMAL HIGH (ref 1.7–7.7)
Neutrophils Relative %: 68 %
Platelets: 243 10*3/uL (ref 150–400)
RBC: 3.77 MIL/uL — ABNORMAL LOW (ref 3.87–5.11)
RDW: 14 % (ref 11.5–15.5)
WBC: 12.2 10*3/uL — ABNORMAL HIGH (ref 4.0–10.5)
nRBC: 0 % (ref 0.0–0.2)

## 2019-09-12 LAB — CREATININE, SERUM
Creatinine, Ser: 0.73 mg/dL (ref 0.44–1.00)
GFR calc Af Amer: >60 mL/min (ref >60)
GFR calc non Af Amer: >60 mL/min (ref >60)

## 2019-09-12 LAB — CBC
HCT: 39.3 % (ref 36.0–46.0)
Hemoglobin: 12.6 g/dL (ref 12.0–15.0)
MCH: 32.6 pg (ref 26.0–34.0)
MCHC: 32.1 g/dL (ref 30.0–36.0)
MCV: 101.8 fL — ABNORMAL HIGH (ref 80.0–100.0)
Platelets: 275 10*3/uL (ref 150–400)
RBC: 3.86 MIL/uL — ABNORMAL LOW (ref 3.87–5.11)
RDW: 13.9 % (ref 11.5–15.5)
WBC: 12.4 10*3/uL — ABNORMAL HIGH (ref 4.0–10.5)
nRBC: 0 % (ref 0.0–0.2)

## 2019-09-12 LAB — LIPID PANEL
Cholesterol: 282 mg/dL — ABNORMAL HIGH (ref 0–200)
HDL: 62 mg/dL (ref >40)
LDL Cholesterol: 194 mg/dL — ABNORMAL HIGH (ref 0–99)
Total CHOL/HDL Ratio: 4.5 RATIO
Triglycerides: 131 mg/dL (ref <150)
VLDL: 26 mg/dL (ref 0–40)

## 2019-09-12 MED ORDER — MOMETASONE FURO-FORMOTEROL FUM 200-5 MCG/ACT IN AERO
2.0000 | INHALATION_SPRAY | Freq: Two times a day (BID) | RESPIRATORY_TRACT | Status: DC
  Administered 2019-09-12 – 2019-09-28 (×30): 2 via RESPIRATORY_TRACT
  Filled 2019-09-12: qty 8.8

## 2019-09-12 MED ORDER — ENOXAPARIN SODIUM 40 MG/0.4ML ~~LOC~~ SOLN
40.0000 mg | SUBCUTANEOUS | Status: DC
  Administered 2019-09-12 – 2019-09-22 (×11): 40 mg via SUBCUTANEOUS
  Filled 2019-09-12 (×11): qty 0.4

## 2019-09-12 MED ORDER — DOXYCYCLINE HYCLATE 100 MG PO TABS
100.0000 mg | ORAL_TABLET | Freq: Two times a day (BID) | ORAL | Status: AC
  Administered 2019-09-12 – 2019-09-13 (×2): 100 mg via ORAL
  Filled 2019-09-12 (×2): qty 1

## 2019-09-12 MED ORDER — DM-GUAIFENESIN ER 30-600 MG PO TB12: 1.0000 | ORAL_TABLET | Freq: Two times a day (BID) | ORAL | Status: DC | PRN

## 2019-09-12 MED ORDER — HYDROCODONE-ACETAMINOPHEN 5-325 MG PO TABS
1.0000 | ORAL_TABLET | ORAL | Status: DC | PRN
  Administered 2019-09-12 – 2019-09-19 (×12): 1 via ORAL
  Filled 2019-09-12 (×13): qty 1

## 2019-09-12 MED ORDER — PANTOPRAZOLE SODIUM 40 MG PO TBEC
40.0000 mg | DELAYED_RELEASE_TABLET | Freq: Every day | ORAL | Status: DC
  Administered 2019-09-13 – 2019-09-28 (×16): 40 mg via ORAL
  Filled 2019-09-12 (×16): qty 1

## 2019-09-12 MED ORDER — PREDNISONE 10 MG PO TABS: ORAL_TABLET | ORAL | 0 refills | Status: DC

## 2019-09-12 MED ORDER — IPRATROPIUM-ALBUTEROL 0.5-2.5 (3) MG/3ML IN SOLN
3.0000 mL | Freq: Three times a day (TID) | RESPIRATORY_TRACT | Status: DC
  Administered 2019-09-12 – 2019-09-15 (×8): 3 mL via RESPIRATORY_TRACT
  Filled 2019-09-12 (×7): qty 3

## 2019-09-12 MED ORDER — ALBUTEROL SULFATE (2.5 MG/3ML) 0.083% IN NEBU
2.5000 mg | INHALATION_SOLUTION | Freq: Four times a day (QID) | RESPIRATORY_TRACT | Status: DC | PRN
  Administered 2019-09-15 – 2019-09-25 (×13): 2.5 mg via RESPIRATORY_TRACT
  Filled 2019-09-12 (×13): qty 3

## 2019-09-12 MED ORDER — VENLAFAXINE HCL ER 75 MG PO CP24
150.0000 mg | ORAL_CAPSULE | Freq: Every day | ORAL | Status: DC
  Administered 2019-09-12 – 2019-09-27 (×16): 150 mg via ORAL
  Filled 2019-09-12 (×16): qty 2

## 2019-09-12 MED ORDER — LIDOCAINE 5 % EX PTCH
1.0000 | MEDICATED_PATCH | CUTANEOUS | Status: DC
  Administered 2019-09-12: 1 via TRANSDERMAL
  Filled 2019-09-12: qty 1

## 2019-09-12 MED ORDER — PREDNISONE 20 MG PO TABS
40.0000 mg | ORAL_TABLET | Freq: Every day | ORAL | Status: AC
  Administered 2019-09-13: 40 mg via ORAL
  Filled 2019-09-12: qty 2

## 2019-09-12 MED ORDER — LORATADINE 10 MG PO TABS
10.0000 mg | ORAL_TABLET | Freq: Every evening | ORAL | Status: DC
  Administered 2019-09-12 – 2019-09-27 (×16): 10 mg via ORAL
  Filled 2019-09-12 (×16): qty 1

## 2019-09-12 MED ORDER — SENNOSIDES-DOCUSATE SODIUM 8.6-50 MG PO TABS: 1.0000 | ORAL_TABLET | Freq: Two times a day (BID) | ORAL | Status: AC

## 2019-09-12 MED ORDER — MONTELUKAST SODIUM 10 MG PO TABS
10.0000 mg | ORAL_TABLET | Freq: Every day | ORAL | Status: DC
  Administered 2019-09-12 – 2019-09-27 (×16): 10 mg via ORAL
  Filled 2019-09-12 (×16): qty 1

## 2019-09-12 MED ORDER — BACLOFEN 10 MG PO TABS
10.0000 mg | ORAL_TABLET | Freq: Three times a day (TID) | ORAL | Status: DC
  Administered 2019-09-12 – 2019-09-28 (×47): 10 mg via ORAL
  Filled 2019-09-12 (×47): qty 1

## 2019-09-12 MED ORDER — SENNOSIDES-DOCUSATE SODIUM 8.6-50 MG PO TABS
1.0000 | ORAL_TABLET | Freq: Two times a day (BID) | ORAL | Status: DC
  Administered 2019-09-12 – 2019-09-28 (×32): 1 via ORAL
  Filled 2019-09-12 (×32): qty 1

## 2019-09-12 MED ORDER — LIDOCAINE 5 % EX PTCH: 1.0000 | MEDICATED_PATCH | CUTANEOUS | 0 refills | Status: AC

## 2019-09-12 MED ORDER — LIDOCAINE 5 % EX PTCH
1.0000 | MEDICATED_PATCH | CUTANEOUS | Status: DC
  Administered 2019-09-12 – 2019-09-17 (×6): 1 via TRANSDERMAL
  Filled 2019-09-12 (×6): qty 1

## 2019-09-12 MED ORDER — METHYLPHENIDATE HCL ER 18 MG PO TB24
72.0000 mg | ORAL_TABLET | ORAL | Status: DC
  Administered 2019-09-13 – 2019-09-28 (×15): 72 mg via ORAL
  Filled 2019-09-12 (×17): qty 4

## 2019-09-12 MED ORDER — GUAIFENESIN ER 600 MG PO TB12: 600.0000 mg | ORAL_TABLET | Freq: Two times a day (BID) | ORAL | Status: AC

## 2019-09-12 MED ORDER — LAMOTRIGINE 100 MG PO TABS
200.0000 mg | ORAL_TABLET | Freq: Every day | ORAL | Status: DC
  Administered 2019-09-12 – 2019-09-27 (×16): 200 mg via ORAL
  Filled 2019-09-12 (×16): qty 2

## 2019-09-12 MED ORDER — ENOXAPARIN SODIUM 40 MG/0.4ML ~~LOC~~ SOLN: 40.0000 mg | SUBCUTANEOUS | Status: DC

## 2019-09-12 MED ORDER — RISPERIDONE 3 MG PO TABS
3.0000 mg | ORAL_TABLET | Freq: Every day | ORAL | Status: DC
  Administered 2019-09-12 – 2019-09-27 (×16): 3 mg via ORAL
  Filled 2019-09-12 (×16): qty 1

## 2019-09-12 MED ORDER — ALPRAZOLAM 0.25 MG PO TABS
0.2500 mg | ORAL_TABLET | Freq: Two times a day (BID) | ORAL | Status: DC | PRN
  Administered 2019-09-12 – 2019-09-25 (×17): 0.25 mg via ORAL
  Filled 2019-09-12 (×19): qty 1

## 2019-09-12 MED ORDER — DOXYCYCLINE HYCLATE 100 MG PO TABS: 100.0000 mg | ORAL_TABLET | Freq: Two times a day (BID) | ORAL | Status: AC

## 2019-09-12 MED ORDER — VITAMIN D (ERGOCALCIFEROL) 1.25 MG (50000 UNIT) PO CAPS
50000.0000 [IU] | ORAL_CAPSULE | ORAL | Status: DC
  Administered 2019-09-13 – 2019-09-27 (×3): 50000 [IU] via ORAL
  Filled 2019-09-12 (×3): qty 1

## 2019-09-12 MED ORDER — FAMOTIDINE 20 MG PO TABS
20.0000 mg | ORAL_TABLET | Freq: Every day | ORAL | Status: DC
  Administered 2019-09-12 – 2019-09-27 (×16): 20 mg via ORAL
  Filled 2019-09-12 (×16): qty 1

## 2019-09-12 MED ORDER — ATORVASTATIN CALCIUM 40 MG PO TABS: 40.0000 mg | ORAL_TABLET | Freq: Every day | ORAL | 0 refills | Status: DC

## 2019-09-12 MED ORDER — QUETIAPINE FUMARATE 50 MG PO TABS
150.0000 mg | ORAL_TABLET | Freq: Every day | ORAL | Status: DC
  Administered 2019-09-12 – 2019-09-27 (×16): 150 mg via ORAL
  Filled 2019-09-12 (×16): qty 3

## 2019-09-12 MED ORDER — GUAIFENESIN ER 600 MG PO TB12
600.0000 mg | ORAL_TABLET | Freq: Two times a day (BID) | ORAL | Status: DC
  Administered 2019-09-12 – 2019-09-28 (×32): 600 mg via ORAL
  Filled 2019-09-12 (×32): qty 1

## 2019-09-12 NOTE — Plan of Care (Signed)
Pt arrived to unit and was oriented to rehab

## 2019-09-12 NOTE — Progress Notes (Addendum)
Physical Therapy Treatment Patient Details Name: Julia Daniels MRN: [id number] DOB: Jun 14, 1950 Today's Date: 09/12/2019    History of Present Illness Julia Daniels is a 69 y.o. year-old female with a history of COPD presenting to the ED8/30/  with chief complaint of shortness of breath.Admitted 8/18 through 09/02/2019 with a COPD exacerbation. Noted T6 compression fracture to be treated with TLSO or vertebroplasty    PT Comments    Pt in bed easily arouse. General Comments: AxO x 3 pain improved with meds pt feeling better.  Assisted to EOB.  General bed mobility comments: demonstarted and instructed on proper tech to log roll to minimze back pain.  Assisted to EOB and demonstrated and instructed proper donning TLSO required Min Assist and 50% VC's instruction to apply and remove.  General transfer comment: min guard for safety due to decreased stability and activity tolerance.  General Gait Details: assisted with amb an increased distance around room trial RA decreased 84% with HR 104 and 2/4 dyspnea.  Reapplied 3 lts to achieve 92%.  Amb distance limited by dyspnea.  Back pain was improved with standing and brace use. Assisted back to bed per pt request.  "My back hurts to much to sit in that chair" offered recliner.    Prior pt used oxygen at night and "sometimes" as needed during day 2 lts  SATURATION QUALIFICATIONS: (This note is used to comply with regulatory documentation for home oxygen)  Patient Saturations on Room Air at Rest = 90%  Patient Saturations on Room Air while Ambulating 30 feet = 84%  Patient Saturations on 3 Liters of oxygen while Ambulating = 90%  Please briefly explain why patient needs home oxygen: Pt requires supplemental oxtgen during activity to achieve therapeutic levels  Follow Up Recommendations  CIR     Equipment Recommendations  Rolling walker with 5" wheels    Recommendations for Other Services       Precautions / Restrictions  Precautions Precautions: Fall Precaution Comments: new T 6 comp Fx and COPD (home O2 at night/PRN day) Required Braces or Orthoses: Spinal Brace Spinal Brace: Thoracolumbosacral orthotic;Applied in sitting position Restrictions Other Position/Activity Restrictions: Don/doff EOB for self teaching    Mobility  Bed Mobility Overal bed mobility: Needs Assistance Bed Mobility: Rolling Rolling: Min assist Sidelying to sit: Min assist       General bed mobility comments: demonstarted and instructed on proper tech to log roll to minimze back pain.  Assisted to EOB and demonstrated and instructed proper donning TLSO required Min Assist and 50% VC's instruction to apply and remove.  Transfers Overall transfer level: Needs assistance Equipment used: None;Rolling walker (2 wheeled) Transfers: Stand Pivot Transfers;Sit to/from Stand Sit to Stand: Supervision Stand pivot transfers: Supervision;Min guard       General transfer comment: min guard for safety due to decreased stability and activity tolerance  Ambulation/Gait Ambulation/Gait assistance: Supervision;Min guard Gait Distance (Feet): 30 Feet Assistive device: Rolling walker (2 wheeled) Gait Pattern/deviations: Step-through pattern;Decreased stride length Gait velocity: decr   General Gait Details: assisted with amb an increased distance around room trial RA decreased 84% with HR 104 and 2/4 dyspnea.  Reapplied 3 lts to achieve 92%.  Amb distance limited by dyspnea.  Back pain was improved with standing and brace use.   Stairs             Wheelchair Mobility    Modified Rankin (Stroke Patients Only)       Balance  Cognition Arousal/Alertness: Awake/alert Behavior During Therapy: WFL for tasks assessed/performed Overall Cognitive Status: Within Functional Limits for tasks assessed                                 General Comments: AxO x 3  pain improved with meds pt feeling better      Exercises      General Comments        Pertinent Vitals/Pain Pain Assessment: 0-10 Pain Score: 7  Pain Location: mid thoracic back and L side with mobility Pain Descriptors / Indicators: Aching;Guarding;Grimacing Pain Intervention(s): Monitored during session;Repositioned    Home Living                      Prior Function            PT Goals (current goals can now be found in the care plan section) Progress towards PT goals: Progressing toward goals    Frequency    Min 3X/week      PT Plan Current plan remains appropriate    Co-evaluation              AM-PAC PT "6 Clicks" Mobility   Outcome Measure  Help needed turning from your back to your side while in a flat bed without using bedrails?: A Little Help needed moving from lying on your back to sitting on the side of a flat bed without using bedrails?: A Little Help needed moving to and from a bed to a chair (including a wheelchair)?: A Little Help needed standing up from a chair using your arms (e.g., wheelchair or bedside chair)?: A Little Help needed to walk in hospital room?: A Lot Help needed climbing 3-5 steps with a railing? : A Lot 6 Click Score: 16    End of Session Equipment Utilized During Treatment: Gait belt Activity Tolerance: Other (comment);Patient limited by fatigue (dyspnea)   Nurse Communication: Mobility status PT Visit Diagnosis: Difficulty in walking, not elsewhere classified (R26.2)     Time: 6553-7482 PT Time Calculation (min) (ACUTE ONLY): 25 min  Charges:  $Gait Training: 8-22 mins $Therapeutic Activity: 8-22 mins                     Rica Koyanagi  PTA Acute  Rehabilitation Services Pager      [phone] Office      [phone]

## 2019-09-12 NOTE — Progress Notes (Signed)
Inpatient Rehabilitation  Patient information reviewed and entered into eRehab system by Jabreel Chimento M. Jaxsun Ciampi, M.A., CCC/SLP, PPS Coordinator.  Information including medical coding, functional ability and quality indicators will be reviewed and updated through discharge.    

## 2019-09-12 NOTE — PMR Pre-admission (Signed)
PMR Admission Coordinator Pre-Admission Assessment  Patient: Julia Daniels is an 69 y.o., female MRN: [id number] DOB: 12-07-1950 Height: _0  (160 cm) Weight: 89 kg  Insurance Information HMO:     PPO: Yes     PCP:       IPA:       80/20:       OTHER:  Group # [id number] PRIMARY: [place]      Policy#: [id number]      Subscriber: patient CM Name: RN      Phone#: [phone]     Fax#: [phone] Pre-Cert#: [id number] from 9/3 to 9/10 with update due on 09/19/19     Employer: [employer] Benefits:  Phone #: [phone]     Name: Preferred provider option medical, [employer] Eff. Date: 01/09/13     Deduct: $750 (met $750)      Out of Pocket Max: [phone] (met [phone])     Life Max: N/A CIR: 80% with auth      SNF: 80% with 120 days limit Outpatient: 80%     Co-Pay: 20% Home Health: 80%      Co-Pay: 20% DME: 80%     Co-Pay: 20% Providers: in network  SECONDARY:        Policy#:       Phone#:    Development worker, community:        Phone#:    The Engineer, petroleum" for patients in Inpatient Rehabilitation Facilities with attached "Privacy Act [place] Records" was provided and verbally reviewed with: N/A  Emergency Contact Information Contact Information    Name Relation Home Work Mobile   Zihlman Son [phone]        Current Medical History  Patient Admitting Diagnosis: Debility/COPD/Resp Failure/T6 comp Fx  History of Present Illness: a 69 year old right-handed female with history of COPD who quit smoking 2 years ago and multiple admissions for COPD exacerbation, anxiety disorder.  Per chart review patient lives with her family.  1 level home 4 steps to entry.  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  Presented 09/08/2019 with increasing shortness of breath as well as wheezing.  CT angiogram of the chest no evidence of pulmonary emboli.  Marked severity coronary artery calcification.  Compression fracture deformity of  indeterminate age involving the T6 vertebral body.  CT abdomen pelvis showed no acute intra-abdominal or intrapelvic process.  Prominent lower lumbar spondylosis greatest at L4-5 and L5-S1.  CT lumbar spine no acute bony abnormality. Admission chemistries unremarkable, SARS current virus negative, troponin negative, urinalysis negative nitrite.  Interventional radiology consulted for possible kyphoplasty T6 and was not recommended at this time advised conservative care and fitted with a TLSO back brace.  Vascular study right upper extremity for some edema negative for DVT.  Maintained on Lovenox for DVT prophylaxis.  Nebulizer treatments ongoing for her COPD.  Pain managed with Lidoderm patch, scheduled baclofen 10 mg 3 times daily as well as hydrocodone as needed.  Therapy evaluations completed and patient was admitted for comprehensive rehab program.  Patient's medical record from [facility] has been reviewed by the rehabilitation admission coordinator and physician.  Past Medical History  Past Medical History:  Diagnosis Date  . Anxiety   . Asthma   . COPD (chronic obstructive pulmonary disease) ([place])   . Depression     Family History   family history includes Asthma in her mother; COPD in her father; Lung cancer in her father.  Prior  Rehab/Hospitalizations Has the patient had prior rehab or hospitalizations prior to admission? No  Has the patient had major surgery during 100 days prior to admission? No   Current Medications  Current Facility-Administered Medications:  .  albuterol (PROVENTIL) (2.5 MG/3ML) 0.083% nebulizer solution 2.5 mg, 2.5 mg, Nebulization, Q6H PRN, Jonelle Sidle, Mohammad L, MD .  ALPRAZolam Duanne Moron) tablet 0.25 mg, 0.25 mg, Oral, BID PRN, Nita Sells, MD, 0.25 mg at 09/12/19 0425 .  baclofen (LIORESAL) tablet 10 mg, 10 mg, Oral, TID, Gala Romney L, MD, 10 mg at 09/12/19 0831 .  bismuth subsalicylate (PEPTO BISMOL) chewable tablet 262-524 mg, 262-524  mg, Oral, Daily PRN, Jonelle Sidle, Mohammad L, MD .  dextromethorphan-guaiFENesin (MUCINEX DM) 30-600 MG per 12 hr tablet 1 tablet, 1 tablet, Oral, BID PRN, Elwyn Reach, MD, 1 tablet at 09/11/19 1234 .  doxycycline (VIBRA-TABS) tablet 100 mg, 100 mg, Oral, Q12H, Garba, Mohammad L, MD, 100 mg at 09/12/19 0830 .  enoxaparin (LOVENOX) injection 40 mg, 40 mg, Subcutaneous, Q24H, Garba, Mohammad L, MD, 40 mg at 09/11/19 2307 .  famotidine (PEPCID) tablet 20 mg, 20 mg, Oral, QHS, Garba, Mohammad L, MD, 20 mg at 09/11/19 2304 .  guaiFENesin (MUCINEX) 12 hr tablet 600 mg, 600 mg, Oral, BID, Florencia Reasons, MD, 600 mg at 09/12/19 0831 .  HYDROcodone-acetaminophen (NORCO/VICODIN) 5-325 MG per tablet 1 tablet, 1 tablet, Oral, Q4H PRN, Elwyn Reach, MD, 1 tablet at 09/12/19 0838 .  ipratropium-albuterol (DUONEB) 0.5-2.5 (3) MG/3ML nebulizer solution 3 mL, 3 mL, Nebulization, TID, Florencia Reasons, MD, 3 mL at 09/12/19 1407 .  lamoTRIgine (LAMICTAL) tablet 200 mg, 200 mg, Oral, QHS, Garba, Mohammad L, MD, 200 mg at 09/11/19 2305 .  lidocaine (LIDODERM) 5 % 1 patch, 1 patch, Transdermal, Q24H, Florencia Reasons, MD, 1 patch at 09/11/19 2106 .  lidocaine (LIDODERM) 5 % 1 patch, 1 patch, Transdermal, Q24H, Florencia Reasons, MD, 1 patch at 09/12/19 1337 .  loratadine (CLARITIN) tablet 10 mg, 10 mg, Oral, QPM, Garba, Mohammad L, MD, 10 mg at 09/11/19 1720 .  methylphenidate (CONCERTA) CR tablet 72 mg, 72 mg, Oral, BH-q7a, Samtani, Jai-Gurmukh, MD, 72 mg at 09/12/19 0830 .  mometasone-formoterol (DULERA) 200-5 MCG/ACT inhaler 2 puff, 2 puff, Inhalation, BID, Elwyn Reach, MD, 2 puff at 09/12/19 0848 .  montelukast (SINGULAIR) tablet 10 mg, 10 mg, Oral, QHS, Garba, Mohammad L, MD, 10 mg at 09/11/19 2305 .  morphine 2 MG/ML injection 2 mg, 2 mg, Intravenous, Q4H PRN, Elwyn Reach, MD, 2 mg at 09/12/19 0511 .  pantoprazole (PROTONIX) EC tablet 40 mg, 40 mg, Oral, Daily, Gala Romney L, MD, 40 mg at 09/12/19 0839 .  [COMPLETED]  methylPREDNISolone sodium succinate (SOLU-MEDROL) 125 mg/2 mL injection 125 mg, 125 mg, Intravenous, Q6H, 125 mg at 09/09/19 1532 **FOLLOWED BY** predniSONE (DELTASONE) tablet 40 mg, 40 mg, Oral, Q breakfast, Garba, Mohammad L, MD, 40 mg at 09/12/19 0830 .  QUEtiapine (SEROQUEL) tablet 150 mg, 150 mg, Oral, QHS, Garba, Mohammad L, MD, 150 mg at 09/11/19 2306 .  risperiDONE (RISPERDAL) tablet 3 mg, 3 mg, Oral, QHS, Garba, Mohammad L, MD, 3 mg at 09/11/19 2303 .  senna-docusate (Senokot-S) tablet 1 tablet, 1 tablet, Oral, BID, Florencia Reasons, MD, 1 tablet at 09/12/19 0831 .  venlafaxine XR (EFFEXOR-XR) 24 hr capsule 150 mg, 150 mg, Oral, QHS, Garba, Mohammad L, MD, 150 mg at 09/11/19 2309 .  [START ON 09/13/2019] Vitamin D (Ergocalciferol) (DRISDOL) capsule 50,000 Units, 50,000 Units, Oral, Q Sat, Garba,  Henderson Newcomer, MD  Patients Current Diet:  Diet Order            Diet general           Diet Heart Room service appropriate? Yes; Fluid consistency: Thin  Diet effective now                 Precautions / Restrictions Precautions Precautions: Fall Precaution Comments: new T 6 comp Fx and COPD (home O2 at night/PRN day) Spinal Brace: Thoracolumbosacral orthotic, Applied in sitting position Restrictions Weight Bearing Restrictions: No Other Position/Activity Restrictions: Don/doff EOB for self teaching   Has the patient had 2 or more falls or a fall with injury in the past year? No  Prior Activity Level Community (5-7x/wk): worked FT nights.  Was driving.  Went out daily most days.  Prior Functional Level Self Care: Did the patient need help bathing, dressing, using the toilet or eating? Independent  Indoor Mobility: Did the patient need assistance with walking from room to room (with or without device)? Independent  Stairs: Did the patient need assistance with internal or external stairs (with or without device)? Needed some help or goes very slowly  Functional Cognition: Did the patient  need help planning regular tasks such as shopping or remembering to take medications? Independent  Home Assistive Devices / Equipment Home Assistive Devices/Equipment: Dentures (specify type), Contact lenses, Walker (specify type), Nebulizer (rolling walker, upper denture, has lower denture but she does not wear it) Home Equipment: None  Prior Device Use: Indicate devices/aids used by the patient prior to current illness, exacerbation or injury? None  Current Functional Level Cognition  Overall Cognitive Status: Within Functional Limits for tasks assessed Orientation Level: Oriented X4 General Comments: AxO x 3 pain improved with meds pt feeling better    Extremity Assessment (includes Sensation/Coordination)  Upper Extremity Assessment: Generalized weakness  Lower Extremity Assessment: Defer to PT evaluation    ADLs  Overall ADL's : Needs assistance/impaired Eating/Feeding: Independent Grooming: Set up, Sitting Upper Body Bathing: Set up, Sitting Lower Body Bathing: Sit to/from stand, Moderate assistance Upper Body Dressing : Set up, Sitting Lower Body Dressing: Moderate assistance, Sitting/lateral leans Lower Body Dressing Details (indicate cue type and reason): significant back pain Toilet Transfer: Stand-pivot, BSC, Min guard Toilet Transfer Details (indicate cue type and reason): decreased activity tolerance due to back pain and dyspnea with exertion  Toileting- Clothing Manipulation and Hygiene: Set up, Sitting/lateral lean Functional mobility during ADLs: Min guard General ADL Comments: patient limited by activity tolerance, strength, back pain requiring increased assistance with self care    Mobility  Overal bed mobility: Needs Assistance Bed Mobility: Rolling Rolling: Min assist Sidelying to sit: Min assist Sit to sidelying: Mod assist General bed mobility comments: demonstarted and instructed on proper tech to log roll to minimze back pain.  Assisted to EOB and  demonstrated and instructed proper donning TLSO required Min Assist and 50% VC's instruction to apply and remove.    Transfers  Overall transfer level: Needs assistance Equipment used: None, Rolling walker (2 wheeled) Transfers: Stand Pivot Transfers, Sit to/from Stand Sit to Stand: Supervision Stand pivot transfers: Supervision, Min guard General transfer comment: min guard for safety due to decreased stability and activity tolerance    Ambulation / Gait / Stairs / Wheelchair Mobility  Ambulation/Gait Ambulation/Gait assistance: Supervision, Min guard Gait Distance (Feet): 30 Feet Assistive device: Rolling walker (2 wheeled) Gait Pattern/deviations: Step-through pattern, Decreased stride length General Gait Details: assisted with amb an increased distance  around room trial RA decreased 84% with HR 104 and 2/4 dyspnea.  Reapplied 3 lts to achieve 92%.  Amb distance limited by dyspnea.  Back pain was improved with standing and brace use. Gait velocity: decr    Posture / Balance Balance Overall balance assessment: Needs assistance Sitting-balance support: Feet supported Sitting balance-Leahy Scale: Fair Standing balance support: Single extremity supported Standing balance-Leahy Scale: Fair Standing balance comment: can stand statically without support, holds onto commode during stand pivot    Special needs/care consideration Oxygen Currently on O2 2-3L [place], TLSO back brace/ Skin bruises easily, has thin skin and Designated visitor son   Previous Environmental health practitioner (from acute therapy documentation) Living Arrangements: Children Available Help at Discharge: Family, Available PRN/intermittently Type of Home: House Home Layout: One level Home Access: Stairs to enter Entrance Stairs-Rails: Right Entrance Stairs-Number of Steps: 4 Bathroom Shower/Tub: Chiropodist: [place]: No  Discharge Living Setting Plans for Discharge Living Setting:  Patient's home, House, Lives with (comment) (Lives with her son.) Type of Home at Discharge: House Discharge Home Layout: One level Discharge Home Access: Stairs to enter Entrance Stairs-Rails: Right Entrance Stairs-Number of Steps: 4 steps at back plus 1 step into kitchen Discharge Bathroom Shower/Tub: Tub/shower unit, Curtain Discharge Bathroom Toilet: Standard Discharge Bathroom Accessibility: No (Per patient, bathroom is small) Does the patient have any problems obtaining your medications?: No  Social/Family/Support Systems Patient Roles: Parent (Has 2 sons.) Contact Information: Isobel Eisenhuth - son - [phone] Anticipated Caregiver: self and son Ability/Limitations of Caregiver: Son works evenings and sleeps mornings Caregiver Availability: Intermittent Discharge Plan Discussed with Primary Caregiver: Yes Is Caregiver In Agreement with Plan?: Yes Does Caregiver/Family have Issues with Lodging/Transportation while Pt is in Rehab?: No  Goals Patient/Family Goal for Rehab: PT/OT mod I goals Expected length of stay: 7-10 days Cultural Considerations: No Pt/Family Agrees to Admission and willing to participate: Yes Program Orientation Provided & Reviewed with Pt/Caregiver Including Roles  & Responsibilities: Yes  Decrease burden of Care through IP rehab admission: N/A  Possible need for SNF placement upon discharge: Not anticipated  Patient Condition: I have reviewed medical records from [facility], spoken with CSW, and patient. I discussed via phone for inpatient rehabilitation assessment.  Patient will benefit from ongoing PT and OT, can actively participate in 3 hours of therapy a day 5 days of the week, and can make measurable gains during the admission.  Patient will also benefit from the coordinated team approach during an Inpatient Acute Rehabilitation admission.  The patient will receive intensive therapy as well as Rehabilitation physician, nursing, social worker,  and care management interventions.  Due to safety, skin/wound care, disease management, medication administration, pain management and patient education the patient requires 24 hour a day rehabilitation nursing.  The patient is currently minguard to min 100' with mobility and min to mod assist with basic ADLs.  Discharge setting and therapy post discharge at home with home health is anticipated.  Patient has agreed to participate in the Acute Inpatient Rehabilitation Program and will admit today.  Preadmission Screen Completed By:  Retta Diones, 09/12/2019 2:53 PM ______________________________________________________________________   Discussed status with Dr. Naaman Plummer and Dr. Ranell Patrick on 09/12/19 at 1000 and received approval for admission today.  Admission Coordinator:  Retta Diones, RN, time 1449/Date 09/12/19   Assessment/Plan: Diagnosis: Acute on chronic respiratory failure with hypoxia 1. Does the need for close, 24 hr/day Medical supervision in concert with the patient's rehab needs make  it unreasonable for this patient to be served in a less intensive setting? Yes 2. Co-Morbidities requiring supervision/potential complications: tobacco abuse, HLD, anxiety, COPD exacerbation, essential HTN, back pain, ADD, bipolar disorder 3. Due to bladder management, bowel management, safety, skin/wound care, disease management, medication administration, pain management and patient education, does the patient require 24 hr/day rehab nursing? Yes 4. Does the patient require coordinated care of a physician, rehab nurse, PT, OT to address physical and functional deficits in the context of the above medical diagnosis(es)? Yes Addressing deficits in the following areas: balance, endurance, locomotion, strength, transferring, bowel/bladder control, bathing, dressing, feeding, grooming, toileting and psychosocial support 5. Can the patient actively participate in an intensive therapy program of at least 3 hrs of  therapy 5 days a week? Yes 6. The potential for patient to make measurable gains while on inpatient rehab is excellent 7. Anticipated functional outcomes upon discharge from inpatient rehab: modified independent PT, modified independent OT, independent SLP 8. Estimated rehab length of stay to reach the above functional goals is: 8-9 days 9. Anticipated discharge destination: Home 10. Overall Rehab/Functional Prognosis: excellent   MD Signature: Leeroy Cha, MD

## 2019-09-12 NOTE — Progress Notes (Signed)
Izora Ribas, MD  Physician  Physical Medicine and Rehabilitation  PMR Pre-admission    Signed  Date of Service:  09/12/2019 2:31 PM      Related encounter: ED to Hosp-Admission (Current) from 09/08/2019 in [facility]      Signed       Show:Clear all [x] Manual[x] Template[x] Copied  Added by: [x] Retta Diones, RN[x] Raulkar, Clide Deutscher, MD  [] Hover for details PMR Admission Coordinator Pre-Admission Assessment  Patient: Julia Daniels is an 69 y.o., female MRN: [id number] DOB: 1950-08-03 Height: 5' 3"  (160 cm) Weight: 89 kg  Insurance Information HMO:     PPO: Yes     PCP:       IPA:       80/20:       OTHER:  Group # [id number] PRIMARY: [place]      Policy#: [id number]      Subscriber: patient CM Name: RN      Phone#: [phone]     Fax#: [phone] Pre-Cert#: [id number] from 9/3 to 9/10 with update due on 09/19/19     Employer: [employer] Benefits:  Phone #: [phone]     Name: Preferred provider option medical, [employer] Eff. Date: 01/09/13     Deduct: $750 (met $750)      Out of Pocket Max: [phone] (met [phone])     Life Max: N/A CIR: 80% with auth      SNF: 80% with 120 days limit Outpatient: 80%     Co-Pay: 20% Home Health: 80%      Co-Pay: 20% DME: 80%     Co-Pay: 20% Providers: in network  SECONDARY:        Policy#:       Phone#:    Development worker, community:        Phone#:    The Engineer, petroleum" for patients in Inpatient Rehabilitation Facilities with attached "Privacy Act [place] Records" was provided and verbally reviewed with: N/A  Emergency Contact Information         Contact Information    Name Relation Home Work Mobile   [name] [phone]        Current Medical History  Patient Admitting Diagnosis: Debility/COPD/Resp Failure/T6 comp Fx  History of Present Illness: a 69 year old right-handed female with history of COPD who quit  smoking 2 years ago and multiple admissions for COPD exacerbation, anxiety disorder. Per chart review patient lives with her family. 1 level home 4 steps to entry. 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 Presented 09/08/2019 with increasing shortness of breath as well as wheezing. CT angiogram of the chest no evidence of pulmonary emboli. Marked severity coronary artery calcification. Compression fracture deformity of indeterminate age involving the T6 vertebral body. CT abdomen pelvis showed no acute intra-abdominal or intrapelvic process. Prominent lower lumbar spondylosis greatest at L4-5 and L5-S1. CT lumbar spine no acute bony abnormality. Admission chemistries unremarkable, SARS current virus negative, troponin negative, urinalysis negative nitrite. Interventional radiology consulted for possible kyphoplasty T6 and was not recommended at this time advised conservative care and fitted with a TLSO back brace. Vascular study right upper extremity for some edema negative for DVT. Maintained on Lovenox for DVT prophylaxis. Nebulizer treatments ongoing for her COPD. Pain managed with Lidoderm patch, scheduled baclofen 10 mg 3 times daily as well as hydrocodone as needed. Therapy evaluations completed and patient was admitted for comprehensive rehab program.  Patient's medical record from [place]  [facility] has been reviewed by the rehabilitation admission coordinator and physician.  Past Medical History      Past Medical History:  Diagnosis Date  . Anxiety   . Asthma   . COPD (chronic obstructive pulmonary disease) ([place])   . Depression     Family History   family history includes Asthma in her mother; COPD in her father; Lung cancer in her father.  Prior Rehab/Hospitalizations Has the patient had prior rehab or hospitalizations prior to admission? No  Has the patient had major surgery during 100 days prior to admission? No               Current Medications  Current Facility-Administered Medications:  .  albuterol (PROVENTIL) (2.5 MG/3ML) 0.083% nebulizer solution 2.5 mg, 2.5 mg, Nebulization, Q6H PRN, Jonelle Sidle, Mohammad L, MD .  ALPRAZolam Duanne Moron) tablet 0.25 mg, 0.25 mg, Oral, BID PRN, Nita Sells, MD, 0.25 mg at 09/12/19 0425 .  baclofen (LIORESAL) tablet 10 mg, 10 mg, Oral, TID, Gala Romney L, MD, 10 mg at 09/12/19 0831 .  bismuth subsalicylate (PEPTO BISMOL) chewable tablet 262-524 mg, 262-524 mg, Oral, Daily PRN, Jonelle Sidle, Mohammad L, MD .  dextromethorphan-guaiFENesin (MUCINEX DM) 30-600 MG per 12 hr tablet 1 tablet, 1 tablet, Oral, BID PRN, Elwyn Reach, MD, 1 tablet at 09/11/19 1234 .  doxycycline (VIBRA-TABS) tablet 100 mg, 100 mg, Oral, Q12H, Garba, Mohammad L, MD, 100 mg at 09/12/19 0830 .  enoxaparin (LOVENOX) injection 40 mg, 40 mg, Subcutaneous, Q24H, Garba, Mohammad L, MD, 40 mg at 09/11/19 2307 .  famotidine (PEPCID) tablet 20 mg, 20 mg, Oral, QHS, Garba, Mohammad L, MD, 20 mg at 09/11/19 2304 .  guaiFENesin (MUCINEX) 12 hr tablet 600 mg, 600 mg, Oral, BID, Florencia Reasons, MD, 600 mg at 09/12/19 0831 .  HYDROcodone-acetaminophen (NORCO/VICODIN) 5-325 MG per tablet 1 tablet, 1 tablet, Oral, Q4H PRN, Elwyn Reach, MD, 1 tablet at 09/12/19 0838 .  ipratropium-albuterol (DUONEB) 0.5-2.5 (3) MG/3ML nebulizer solution 3 mL, 3 mL, Nebulization, TID, Florencia Reasons, MD, 3 mL at 09/12/19 1407 .  lamoTRIgine (LAMICTAL) tablet 200 mg, 200 mg, Oral, QHS, Garba, Mohammad L, MD, 200 mg at 09/11/19 2305 .  lidocaine (LIDODERM) 5 % 1 patch, 1 patch, Transdermal, Q24H, Florencia Reasons, MD, 1 patch at 09/11/19 2106 .  lidocaine (LIDODERM) 5 % 1 patch, 1 patch, Transdermal, Q24H, Florencia Reasons, MD, 1 patch at 09/12/19 1337 .  loratadine (CLARITIN) tablet 10 mg, 10 mg, Oral, QPM, Garba, Mohammad L, MD, 10 mg at 09/11/19 1720 .  methylphenidate (CONCERTA) CR tablet 72 mg, 72 mg, Oral, BH-q7a, Samtani, Jai-Gurmukh, MD, 72 mg at 09/12/19  0830 .  mometasone-formoterol (DULERA) 200-5 MCG/ACT inhaler 2 puff, 2 puff, Inhalation, BID, Elwyn Reach, MD, 2 puff at 09/12/19 0848 .  montelukast (SINGULAIR) tablet 10 mg, 10 mg, Oral, QHS, Garba, Mohammad L, MD, 10 mg at 09/11/19 2305 .  morphine 2 MG/ML injection 2 mg, 2 mg, Intravenous, Q4H PRN, Elwyn Reach, MD, 2 mg at 09/12/19 0511 .  pantoprazole (PROTONIX) EC tablet 40 mg, 40 mg, Oral, Daily, Gala Romney L, MD, 40 mg at 09/12/19 0839 .  [COMPLETED] methylPREDNISolone sodium succinate (SOLU-MEDROL) 125 mg/2 mL injection 125 mg, 125 mg, Intravenous, Q6H, 125 mg at 09/09/19 1532 **FOLLOWED BY** predniSONE (DELTASONE) tablet 40 mg, 40 mg, Oral, Q breakfast, Garba, Mohammad L, MD, 40 mg at 09/12/19 0830 .  QUEtiapine (SEROQUEL) tablet 150 mg, 150 mg, Oral, QHS, Garba, Henderson Newcomer, MD, 150  mg at 09/11/19 2306 .  risperiDONE (RISPERDAL) tablet 3 mg, 3 mg, Oral, QHS, Garba, Mohammad L, MD, 3 mg at 09/11/19 2303 .  senna-docusate (Senokot-S) tablet 1 tablet, 1 tablet, Oral, BID, Florencia Reasons, MD, 1 tablet at 09/12/19 0831 .  venlafaxine XR (EFFEXOR-XR) 24 hr capsule 150 mg, 150 mg, Oral, QHS, Garba, Mohammad L, MD, 150 mg at 09/11/19 2309 .  [START ON 09/13/2019] Vitamin D (Ergocalciferol) (DRISDOL) capsule 50,000 Units, 50,000 Units, Oral, Q Sat, Garba, Mohammad L, MD  Patients Current Diet:     Diet Order                  Diet general            Diet Heart Room service appropriate? Yes; Fluid consistency: Thin  Diet effective now                  Precautions / Restrictions Precautions Precautions: Fall Precaution Comments: new T 6 comp Fx and COPD (home O2 at night/PRN day) Spinal Brace: Thoracolumbosacral orthotic, Applied in sitting position Restrictions Weight Bearing Restrictions: No Other Position/Activity Restrictions: Don/doff EOB for self teaching   Has the patient had 2 or more falls or a fall with injury in the past year? No  Prior Activity  Level Community (5-7x/wk): worked FT nights.  Was driving.  Went out daily most days.  Prior Functional Level Self Care: Did the patient need help bathing, dressing, using the toilet or eating? Independent  Indoor Mobility: Did the patient need assistance with walking from room to room (with or without device)? Independent  Stairs: Did the patient need assistance with internal or external stairs (with or without device)? Needed some help or goes very slowly  Functional Cognition: Did the patient need help planning regular tasks such as shopping or remembering to take medications? Independent  Home Assistive Devices / Equipment Home Assistive Devices/Equipment: Dentures (specify type), Contact lenses, Walker (specify type), Nebulizer (rolling walker, upper denture, has lower denture but she does not wear it) Home Equipment: None  Prior Device Use: Indicate devices/aids used by the patient prior to current illness, exacerbation or injury? None  Current Functional Level Cognition  Overall Cognitive Status: Within Functional Limits for tasks assessed Orientation Level: Oriented X4 General Comments: AxO x 3 pain improved with meds pt feeling better    Extremity Assessment (includes Sensation/Coordination)  Upper Extremity Assessment: Generalized weakness  Lower Extremity Assessment: Defer to PT evaluation    ADLs  Overall ADL's : Needs assistance/impaired Eating/Feeding: Independent Grooming: Set up, Sitting Upper Body Bathing: Set up, Sitting Lower Body Bathing: Sit to/from stand, Moderate assistance Upper Body Dressing : Set up, Sitting Lower Body Dressing: Moderate assistance, Sitting/lateral leans Lower Body Dressing Details (indicate cue type and reason): significant back pain Toilet Transfer: Stand-pivot, BSC, Min guard Toilet Transfer Details (indicate cue type and reason): decreased activity tolerance due to back pain and dyspnea with exertion  Toileting-  Clothing Manipulation and Hygiene: Set up, Sitting/lateral lean Functional mobility during ADLs: Min guard General ADL Comments: patient limited by activity tolerance, strength, back pain requiring increased assistance with self care    Mobility  Overal bed mobility: Needs Assistance Bed Mobility: Rolling Rolling: Min assist Sidelying to sit: Min assist Sit to sidelying: Mod assist General bed mobility comments: demonstarted and instructed on proper tech to log roll to minimze back pain.  Assisted to EOB and demonstrated and instructed proper donning TLSO required Min Assist and 50% VC's instruction to apply  and remove.    Transfers  Overall transfer level: Needs assistance Equipment used: None, Rolling walker (2 wheeled) Transfers: Stand Pivot Transfers, Sit to/from Stand Sit to Stand: Supervision Stand pivot transfers: Supervision, Min guard General transfer comment: min guard for safety due to decreased stability and activity tolerance    Ambulation / Gait / Stairs / Wheelchair Mobility  Ambulation/Gait Ambulation/Gait assistance: Supervision, Min guard Gait Distance (Feet): 30 Feet Assistive device: Rolling walker (2 wheeled) Gait Pattern/deviations: Step-through pattern, Decreased stride length General Gait Details: assisted with amb an increased distance around room trial RA decreased 84% with HR 104 and 2/4 dyspnea.  Reapplied 3 lts to achieve 92%.  Amb distance limited by dyspnea.  Back pain was improved with standing and brace use. Gait velocity: decr    Posture / Balance Balance Overall balance assessment: Needs assistance Sitting-balance support: Feet supported Sitting balance-Leahy Scale: Fair Standing balance support: Single extremity supported Standing balance-Leahy Scale: Fair Standing balance comment: can stand statically without support, holds onto commode during stand pivot    Special needs/care consideration Oxygen Currently on O2 2-3L [place], TLSO back  brace/ Skin bruises easily, has thin skin and Designated visitor son   Previous Environmental health practitioner (from acute therapy documentation) Living Arrangements: Children Available Help at Discharge: Family, Available PRN/intermittently Type of Home: House Home Layout: One level Home Access: Stairs to enter Entrance Stairs-Rails: Right Entrance Stairs-Number of Steps: 4 Bathroom Shower/Tub: Chiropodist: [place]: No  Discharge Living Setting Plans for Discharge Living Setting: Patient's home, House, Lives with (comment) (Lives with her son.) Type of Home at Discharge: House Discharge Home Layout: One level Discharge Home Access: Stairs to enter Entrance Stairs-Rails: Right Entrance Stairs-Number of Steps: 4 steps at back plus 1 step into kitchen Discharge Bathroom Shower/Tub: Tub/shower unit, Curtain Discharge Bathroom Toilet: Standard Discharge Bathroom Accessibility: No (Per patient, bathroom is small) Does the patient have any problems obtaining your medications?: No  Social/Family/Support Systems Patient Roles: Parent (Has 2 sons.) Contact Information: Janeshia Ciliberto - son - [phone] Anticipated Caregiver: self and son Ability/Limitations of Caregiver: Son works evenings and sleeps mornings Caregiver Availability: Intermittent Discharge Plan Discussed with Primary Caregiver: Yes Is Caregiver In Agreement with Plan?: Yes Does Caregiver/Family have Issues with Lodging/Transportation while Pt is in Rehab?: No  Goals Patient/Family Goal for Rehab: PT/OT mod I goals Expected length of stay: 7-10 days Cultural Considerations: No Pt/Family Agrees to Admission and willing to participate: Yes Program Orientation Provided & Reviewed with Pt/Caregiver Including Roles  & Responsibilities: Yes  Decrease burden of Care through IP rehab admission: N/A  Possible need for SNF placement upon discharge: Not anticipated  Patient Condition: I have  reviewed medical records from [facility], spoken with CSW, and patient. I discussed via phone for inpatient rehabilitation assessment.  Patient will benefit from ongoing PT and OT, can actively participate in 3 hours of therapy a day 5 days of the week, and can make measurable gains during the admission.  Patient will also benefit from the coordinated team approach during an Inpatient Acute Rehabilitation admission.  The patient will receive intensive therapy as well as Rehabilitation physician, nursing, social worker, and care management interventions.  Due to safety, skin/wound care, disease management, medication administration, pain management and patient education the patient requires 24 hour a day rehabilitation nursing.  The patient is currently minguard to min 100' with mobility and min to mod assist with basic ADLs.  Discharge setting and therapy post discharge at  home with home health is anticipated.  Patient has agreed to participate in the Acute Inpatient Rehabilitation Program and will admit today.  Preadmission Screen Completed By:  Retta Diones, 09/12/2019 2:53 PM ______________________________________________________________________   Discussed status with Dr. Naaman Plummer and Dr. Ranell Patrick on 09/12/19 at 1000 and received approval for admission today.  Admission Coordinator:  Retta Diones, RN, time 1449/Date 09/12/19   Assessment/Plan: Diagnosis: Acute on chronic respiratory failure with hypoxia 1. Does the need for close, 24 hr/day Medical supervision in concert with the patient's rehab needs make it unreasonable for this patient to be served in a less intensive setting? Yes 2. Co-Morbidities requiring supervision/potential complications: tobacco abuse, HLD, anxiety, COPD exacerbation, essential HTN, back pain, ADD, bipolar disorder 3. Due to bladder management, bowel management, safety, skin/wound care, disease management, medication administration, pain management and patient  education, does the patient require 24 hr/day rehab nursing? Yes 4. Does the patient require coordinated care of a physician, rehab nurse, PT, OT to address physical and functional deficits in the context of the above medical diagnosis(es)? Yes Addressing deficits in the following areas: balance, endurance, locomotion, strength, transferring, bowel/bladder control, bathing, dressing, feeding, grooming, toileting and psychosocial support 5. Can the patient actively participate in an intensive therapy program of at least 3 hrs of therapy 5 days a week? Yes 6. The potential for patient to make measurable gains while on inpatient rehab is excellent 7. Anticipated functional outcomes upon discharge from inpatient rehab: modified independent PT, modified independent OT, independent SLP 8. Estimated rehab length of stay to reach the above functional goals is: 8-9 days 9. Anticipated discharge destination: Home 10. Overall Rehab/Functional Prognosis: excellent   MD Signature: Leeroy Cha, MD        Revision History                     Note Details  Author Izora Ribas, MD File Time 09/12/2019 3:00 PM  Author Type Physician Status Signed  Last Editor Izora Ribas, MD Service Physical Medicine and Rehabilitation

## 2019-09-12 NOTE — Discharge Summary (Addendum)
Discharge Summary  Julia Daniels KGY:[id number] DOB: 14-Apr-1950  PCP: Simona Huh, NP  Admit date: 09/08/2019 Discharge date: 09/12/2019  Time spent: 59mins, more than 50% time spent on coordination of care.   Patient is to discharge to inpatient rehab  Recommendations for Outpatient Follow-up after discharged from [facility]:  1. F/u with PCP within a week  for hospital discharge follow up, repeat cbc/bmp/lft  at follow up 2. Follow-up with pulmonology Dr. Melvyn Novas  Discharge Diagnoses:  Active Hospital Problems   Diagnosis Date Noted  . Acute on chronic respiratory failure with hypoxia ([place]) 01/31/2018  . Back pain 09/08/2019  . Essential hypertension 07/27/2019  . COPD exacerbation ([place]) 09/18/2017  . Anxiety 08/23/2017  . Hyperlipidemia 08/23/2017  . Tobacco abuse 08/30/2012    Resolved Hospital Problems  No resolved problems to display.    Discharge Condition: stable  Diet recommendation: heart healthy  Filed Weights   09/08/19 1551  Weight: 89 kg    History of present illness: (Per admitting physician Dr. Jonelle Sidle) Chief Complaint: Shortness of breath  HPI: Julia Daniels is a 68 y.o. female with medical history significant of COPD, anxiety disorder and depression who presents to the ER with complaint of shortness of breath.  Symptoms started 2 to 3 days and not getting better.  EMS were called where she received breathing treatment at home but not relieved.  Patient is a frequent flyer with recurrent COPD exacerbation.  She also has degenerative disc disease and tonight complaining of 10 out of 10 lower back pain not relieved by her hydrocodone and ibuprofen.  She was given IV morphine in the ER which appears to help.  The pain was worsening of her shortness of breath cough.  She is otherwise hemodynamically stable.  Despite 2 rounds of treatment in the ER she is still wheezing and evidence of some hypoxia.  She is being admitted for treatment..  ED Course:  Temperature is 98.6 blood pressure 175/94 pulse 121 respirate of 22 oxygen sats 86% on room air.  White count 16.8, otherwise rest of CBC within normal.  Chest x-ray showed no significant findings.  CT abdomen and pelvis and CT angiogram of the chest showed no PE.  There was mild bibasilar and left upper lobe atelectasis.  Compression fracture deformity of the T6 vertebral body is seen also on CT L-spine.  Patient being admitted for further evaluation and treatment.  Hospital Course:  Principal Problem:   Acute on chronic respiratory failure with hypoxia (HCC) Active Problems:   Tobacco abuse   Hyperlipidemia   Anxiety   COPD exacerbation (HCC)   Essential hypertension   Back pain   Recurrent COPD exacerbation, chronic hypoxic respiratory failure on home O2 as needed -Per chart review she has component of COPD and asthma together, on on Fasenra injection q8weeks, next dose scheduled on 10/4, her pulmonologist is Dr. Melvyn Novas -She has significant wheezing on presentation,  -CTA chest "No evidence of pulmonary embolism.  Mild bibasilar and left upper lobe atelectasis. -She initially received IV steroid , currently on oral steroid , - Zithromax x1 on August 30 then doxycycline 100 mg twice a day -Scheduled nebulizer -wheezing has improved -She is currently on 2 L of oxygen -Continue current management, schedule Mucinex, add on flutter valve to assist expectoration, increase activity  Left forearm edema: Likely IV infiltration -Elevate left arm, seems is improving  -venous Doppler ultrasound no DVT  Compression fracture at T6 With significant back pain Case discussed with IR who  did not think patient is a candidate for kyphoplasty Continue TLSO brace, topical Lidoderm patch ,continue as needed analgesic PT OT eval  Mild elevation of LFT ALT 50 on presentation, otherwise normal CT abdomen pelvis on presentation "hepatobiliary: No focal liver abnormality is seen. No  gallstones, gallbladder wall thickening, or biliary dilatation" LFT has normalized    Marked severity coronary artery calcification Incidental finding on CTA Denies chest pain  lipid panel, LDL 194, HDL 62, total cholesterol 282 She report run out of statin, recommend start Lipitor daily F/u with pcp  Psych: Currently stable on home regimen, continue   Body mass index is 34.76 kg/m.   Failure to thrive: PT recommended CIR Patient is motivated to go to CIR to get better so she can go back to work    DVT prophylaxis: enoxaparin (LOVENOX) injection 40 mg Start: 09/08/19 2200   Code Status: Full Family Communication: Patient Disposition:   Status is: Inpatient  Dispo: The patient is from: Home  Anticipated d/c is to: CIR    Consultants:   [facility]  My colleague Dr. Verlon Au discussed case with pulmonology  Procedures:   None  Antimicrobials:     Zithromax x1 on August 30 then doxycycline 100 mg twice a day   Discharge Exam: BP 115/72 (BP Location: Right Arm)   Pulse (!) 104   Temp 98.9 F (37.2 C) (Oral)   Resp 15   Ht 5\' 3"  (1.6 m)   Wt 89 kg   SpO2 98%   BMI 34.76 kg/m   General: Pleasant, she is very happy that she can go to CR to get stronger, so she can go back to work Cardiovascular: No wheezing today, occasional rhonchi, respiratory effort normal Respiratory: RRR  Discharge Instructions You were cared for by a hospitalist during your hospital stay. If you have any questions about your discharge medications or the care you received while you were in the hospital after you are discharged, you can call the unit and asked to speak with the hospitalist on call if the hospitalist that took care of you is not available. Once you are discharged, your primary care physician will handle any further medical issues. Please note that NO REFILLS for any discharge medications will be authorized once you are discharged, as it is  imperative that you return to your primary care physician (or establish a relationship with a primary care physician if you do not have one) for your aftercare needs so that they can reassess your need for medications and monitor your lab values.  Discharge Instructions    Diet general   Complete by: As directed    Increase activity slowly   Complete by: As directed      Allergies as of 09/12/2019      Reactions   Sulfa Antibiotics Other (See Comments)   Mouth gets raw      Medication List    STOP taking these medications   dextromethorphan-guaiFENesin 30-600 MG 12hr tablet Commonly known as: MUCINEX DM     TAKE these medications   AeroChamber MV inhaler Use as instructed   albuterol 108 (90 Base) MCG/ACT inhaler Commonly known as: ProAir HFA Inhale 2 puffs into the lungs every 6 (six) hours as needed for wheezing or shortness of breath.   albuterol (2.5 MG/3ML) 0.083% nebulizer solution Commonly known as: PROVENTIL Take 3 mLs (2.5 mg total) by nebulization every 6 (six) hours as needed for wheezing or shortness of breath.   ALPRAZolam 0.25 MG tablet  Commonly known as: XANAX Take 0.25 mg by mouth daily as needed for anxiety.   atorvastatin 40 MG tablet Commonly known as: Lipitor Take 1 tablet (40 mg total) by mouth daily.   baclofen 10 MG tablet Commonly known as: LIORESAL Take 10 mg by mouth 3 (three) times daily as needed.   bismuth subsalicylate 998 MG chewable tablet Commonly known as: PEPTO BISMOL Chew 262-524 mg by mouth daily as needed for diarrhea or loose stools.   budesonide-formoterol 160-4.5 MCG/ACT inhaler Commonly known as: Symbicort INHALE 2 PUFFS INTO LUNGS TWICE A DAY What changed:   how much to take  how to take this  when to take this  additional instructions   doxycycline 100 MG tablet Commonly known as: VIBRA-TABS Take 1 tablet (100 mg total) by mouth every 12 (twelve) hours for 2 days.   EPINEPHrine 0.3 mg/0.3 mL Soaj  injection Commonly known as: EPI-PEN Inject 0.3 mLs (0.3 mg total) into the muscle as needed for anaphylaxis.   famotidine 20 MG tablet Commonly known as: PEPCID One at bedtime What changed:   how much to take  how to take this  when to take this  additional instructions   Fasenra 30 MG/ML Sos[place] Generic drug: Benralizumab INJECT 1 SYRINGE UNDER THE SKIN EVERY 8 WEEKS. What changed: See the new instructions.   guaiFENesin 600 MG 12 hr tablet Commonly known as: MUCINEX Take 1 tablet (600 mg total) by mouth 2 (two) times daily.   HYDROcodone-acetaminophen 5-325 MG tablet Commonly known as: NORCO/VICODIN Take 1 tablet by mouth in the morning, at noon, in the evening, and at bedtime.   ibuprofen 800 MG tablet Commonly known as: ADVIL Take 800 mg by mouth 2 (two) times daily as needed for pain.   lamoTRIgine 200 MG tablet Commonly known as: LAMICTAL Take 200 mg by mouth at bedtime.   lidocaine 5 % Commonly known as: LIDODERM Place 1 patch onto the skin daily. Remove & Discard patch within 12 hours or as directed by MD   loratadine 10 MG tablet Commonly known as: CLARITIN Take 10 mg by mouth every evening.   methylphenidate 54 MG CR tablet Commonly known as: CONCERTA Take 54 mg by mouth every morning. Take with 18 mg tablet= 72mg    methylphenidate 18 MG CR tablet Commonly known as: CONCERTA Take 18 mg by mouth daily. Take with 54 mg tablet= 72mg    montelukast 10 MG tablet Commonly known as: SINGULAIR Take 1 tablet (10 mg total) by mouth at bedtime.   pantoprazole 40 MG tablet Commonly known as: Protonix Take 1 tablet (40 mg total) by mouth daily. Take 30-60 min before first meal of the day   predniSONE 10 MG tablet Commonly known as: DELTASONE Take four tablets on day 1, three tablet on day 2, two tablet on day 3, one tablet on day 4, then stop. What changed: See the new instructions.   QUEtiapine 100 MG tablet Commonly known as: SEROQUEL Take 150 mg by  mouth at bedtime.   risperiDONE 1 MG tablet Commonly known as: RISPERDAL Take 3 mg by mouth at bedtime.   senna-docusate 8.6-50 MG tablet Commonly known as: Senokot-S Take 1 tablet by mouth 2 (two) times daily.   Spiriva Respimat 2.5 MCG/ACT Aers Generic drug: Tiotropium Bromide Monohydrate Inhale 2 puffs into the lungs daily.   Spiriva Respimat 2.5 MCG/ACT Aers Generic drug: Tiotropium Bromide Monohydrate Inhale 2 puffs into the lungs daily.   venlafaxine XR 150 MG 24 hr capsule Commonly known  as: EFFEXOR-XR Take 150 mg by mouth at bedtime.   Vitamin D (Ergocalciferol) 1.25 MG (50000 UNIT) Caps capsule Commonly known as: DRISDOL Take 50,000 Units by mouth every Saturday.      Allergies  Allergen Reactions  . Sulfa Antibiotics Other (See Comments)    Mouth gets raw    Follow-up Information    Simona Huh, NP Follow up.   Specialty: Nurse Practitioner Contact information: [place] [address] [phone]        Tanda Rockers, MD Follow up.   Specialty: Pulmonary Disease Contact information: [place] [address] [phone]                The results of significant diagnostics from this hospitalization (including imaging, microbiology, ancillary and laboratory) are listed below for reference.    Significant Diagnostic Studies: CT ANGIO CHEST PE W OR WO CONTRAST  Result Date: 09/08/2019 CLINICAL DATA:  COPD exacerbation. EXAM: CT ANGIOGRAPHY CHEST WITH CONTRAST TECHNIQUE: Multidetector CT imaging of the chest was performed using the standard protocol during bolus administration of intravenous contrast. Multiplanar CT image reconstructions and MIPs were obtained to evaluate the vascular anatomy. CONTRAST:  174mL OMNIPAQUE IOHEXOL 350 MG/ML SOLN COMPARISON:  August 23, 2017 FINDINGS: Cardiovascular: Satisfactory opacification of the pulmonary arteries to the segmental level. No evidence of pulmonary embolism.  Normal heart size. No pericardial effusion. Marked severity coronary artery calcification is noted Mediastinum/Nodes: No enlarged mediastinal, hilar, or axillary lymph nodes. Thyroid gland, trachea, and esophagus demonstrate no significant findings. Lungs/Pleura: Mild atelectasis is seen within the bilateral lung bases and posterior aspect of the left upper lobe. There is no evidence of a pleural effusion or pneumothorax. Upper Abdomen: A 2.6 cm cyst is seen within the right kidney. Musculoskeletal: A compression fracture deformity of indeterminate age is seen involving the T6 vertebral body. This represents a new finding compared to the prior exam. Multilevel degenerative changes are noted throughout the remainder of the thoracic spine. Review of the MIP images confirms the above findings. IMPRESSION: 1. No evidence of pulmonary embolism. 2. Mild bibasilar and left upper lobe atelectasis. 3. Marked severity coronary artery calcification. 4. Compression fracture deformity of indeterminate age involving the T6 vertebral body. This represents a new finding compared to the prior exam. Electronically Signed   By: Virgina Norfolk M.D.   On: 09/08/2019 20:24   CT ABDOMEN PELVIS W CONTRAST  Result Date: 09/08/2019 CLINICAL DATA:  COPD exacerbation, right flank pain, low back pain EXAM: CT ABDOMEN AND PELVIS WITH CONTRAST TECHNIQUE: Multidetector CT imaging of the abdomen and pelvis was performed using the standard protocol following bolus administration of intravenous contrast. CONTRAST:  193mL OMNIPAQUE IOHEXOL 350 MG/ML SOLN COMPARISON:  None. FINDINGS: Lower chest: Scattered atelectasis or scarring at the lung bases. No acute pleural or parenchymal lung disease. Hepatobiliary: No focal liver abnormality is seen. No gallstones, gallbladder wall thickening, or biliary dilatation. Pancreas: Unremarkable. No pancreatic ductal dilatation or surrounding inflammatory changes. Spleen: Normal in size without focal  abnormality. Adrenals/Urinary Tract: Small right renal cyst. Otherwise the kidneys enhance normally. No urinary tract calculi or obstructive uropathy. The adrenals and bladder are unremarkable. Stomach/Bowel: No bowel obstruction or ileus. No wall thickening or inflammatory change. The appendix, if still present, is not well visualized. There are no inflammatory changes to suggest appendicitis. Vascular/Lymphatic: Aortic atherosclerosis. No enlarged abdominal or pelvic lymph nodes. Reproductive: Status post hysterectomy. No adnexal masses. Other: No free fluid or free gas.  No abdominal wall hernia. Musculoskeletal: There are no acute displaced fractures. There is diffuse spondylosis throughout the thoracolumbar spine. From L3/L4 through L5/S1 there is circumferential disc bulge and bilateral facet hypertrophy resulting in mild central canal stenosis and symmetrical neural foraminal encroachment. These changes are most pronounced at the L4/L5 and L5/S1 levels. Reconstructed images demonstrate no additional findings. IMPRESSION: 1. No acute intra-abdominal or intrapelvic process. 2. Prominent lower lumbar spondylosis greatest at L4/L5 and L5/S1. 3. Aortic Atherosclerosis (ICD10-I70.0). Electronically Signed   By: Randa Ngo M.D.   On: 09/08/2019 20:17   CT L-SPINE NO CHARGE  Result Date: 09/08/2019 CLINICAL DATA:  COPD exacerbation, low back pain, right flank pain EXAM: CT LUMBAR SPINE WITHOUT CONTRAST TECHNIQUE: Multidetector CT imaging of the lumbar spine was performed without intravenous contrast administration. Multiplanar CT image reconstructions were also generated. COMPARISON:  None. FINDINGS: Segmentation: There are 5 non-rib-bearing lumbar type vertebral bodies. Alignment: Alignment is anatomic. Vertebrae: There are no acute displaced fractures. No destructive bony lesions. Paraspinal and other soft tissues: Paraspinal soft tissues are unremarkable. Atherosclerosis throughout the abdominal aorta.  Disc levels: There is multilevel thoracolumbar spondylosis. Prominent anterior osteophytes are seen from T10 through L2. No significant central canal or neural foraminal encroachment. At L3/L4 there is bilateral left greater than right neural foraminal encroachment due to circumferential disc bulge and bilateral facet hypertrophy. And L4/L5 there is circumferential disc bulge with bilateral facet hypertrophy resulting in mild central stenosis and symmetrical neural foraminal encroachment. At L5/S1 there is mild circumferential disc bulge and bilateral facet hypertrophy with mild symmetrical neural foraminal encroachment. Reconstructed images demonstrate no additional findings. IMPRESSION: 1. Multilevel thoracolumbar spondylosis, greatest from L3/L4 through L5/S1 as above. 2. No acute bony abnormality. Electronically Signed   By: Randa Ngo M.D.   On: 09/08/2019 20:49   DG Chest Port 1 View  Result Date: 09/08/2019 CLINICAL DATA:  Dyspnea and shortness of breath x2 days. EXAM: PORTABLE CHEST 1 VIEW COMPARISON:  August 27, 2019 FINDINGS: Mild atelectasis and/or infiltrate is seen within the left lung base. There is no evidence of a pleural effusion or pneumothorax. The heart size and mediastinal contours are within normal limits. Tortuosity of the descending thoracic aorta is seen. The visualized skeletal structures are unremarkable. IMPRESSION: Mild left basilar atelectasis and/or infiltrate. Electronically Signed   By: Virgina Norfolk M.D.   On: 09/08/2019 16:30   DG Chest Port 1 View  Result Date: 08/27/2019 CLINICAL DATA:  69 year old female with shortness of breath for several days. COPD. EXAM: PORTABLE CHEST 1 VIEW COMPARISON:  Chest radiograph 06/27/2019 and earlier. FINDINGS: Portable AP semi upright view at 0905 hours. Mildly lower lung volumes. Stable mediastinal contours. Stable trachea. Allowing for portable technique basilar predominant increased pulmonary interstitial markings appear not  significantly changed. Mild linear atelectasis suspected in the periphery of the left mid lung. No pneumothorax or pleural effusion. Paucity of bowel gas in the upper abdomen. No acute osseous abnormality identified. IMPRESSION: Mild atelectasis.  No other acute cardiopulmonary abnormality. Electronically Signed   By: Genevie Ann M.D.   On: 08/27/2019 09:51   ECHOCARDIOGRAM COMPLETE  Result Date: 09/09/2019    ECHOCARDIOGRAM REPORT   Patient Name:   LATESHA CHESNEY Date of Exam: 09/09/2019 Medical Rec #:  [id number]           Height:       63.0 in Accession #:    [id number]          Weight:  196.2 lb Date of Birth:  1950/08/14          BSA:          1.918 m Patient Age:    64 years            BP:           146/109 mmHg Patient Gender: F                   HR:           110 bpm. Exam Location:  Inpatient Procedure: 2D Echo, Cardiac Doppler and Color Doppler Indications:    ACUTE rESPIRATORY INSUFFICIENCY 518.82 / r06.89  History:        Patient has no prior history of Echocardiogram examinations.                 COPD.  Sonographer:    Jonelle Sidle Dance Referring Phys: [id number]  Sonographer Comments: No subcostal window. IMPRESSIONS  1. Left ventricular ejection fraction, by estimation, is 55%. The left ventricle has normal function. The left ventricle demonstrates regional wall motion abnormalities with basal inferior akinesis. Left ventricular diastolic parameters are consistent with Grade I diastolic dysfunction (impaired relaxation).  2. Right ventricular systolic function is normal. The right ventricular size is normal. Tricuspid regurgitation signal is inadequate for assessing PA pressure.  3. The mitral valve is normal in structure. No evidence of mitral valve regurgitation. No evidence of mitral stenosis.  4. The aortic valve is tricuspid. Aortic valve regurgitation is not visualized. No aortic stenosis is present.  5. Aortic dilatation noted. There is mild dilatation of the ascending aorta  measuring 39 mm.  6. The inferior vena cava is normal in size with greater than 50% respiratory variability, suggesting right atrial pressure of 3 mmHg. FINDINGS  Left Ventricle: Left ventricular ejection fraction, by estimation, is 55%. The left ventricle has normal function. The left ventricle demonstrates regional wall motion abnormalities. The left ventricular internal cavity size was normal in size. There is  no left ventricular hypertrophy. Left ventricular diastolic parameters are consistent with Grade I diastolic dysfunction (impaired relaxation). Right Ventricle: The right ventricular size is normal. No increase in right ventricular wall thickness. Right ventricular systolic function is normal. Tricuspid regurgitation signal is inadequate for assessing PA pressure. Left Atrium: Left atrial size was normal in size. Right Atrium: Right atrial size was normal in size. Pericardium: There is no evidence of pericardial effusion. Mitral Valve: The mitral valve is normal in structure. No evidence of mitral valve regurgitation. No evidence of mitral valve stenosis. Tricuspid Valve: The tricuspid valve is normal in structure. Tricuspid valve regurgitation is not demonstrated. Aortic Valve: The aortic valve is tricuspid. Aortic valve regurgitation is not visualized. No aortic stenosis is present. Pulmonic Valve: The pulmonic valve was normal in structure. Pulmonic valve regurgitation is not visualized. Aorta: Aortic dilatation noted. There is mild dilatation of the ascending aorta measuring 39 mm. Venous: The inferior vena cava is normal in size with greater than 50% respiratory variability, suggesting right atrial pressure of 3 mmHg. IAS/Shunts: No atrial level shunt detected by color flow Doppler.  LEFT VENTRICLE PLAX 2D LVIDd:         5.50 cm  Diastology LVIDs:         4.40 cm  LV e' lateral:   7.40 cm/s LV PW:         1.10 cm  LV E/e' lateral: 10.9 LV IVS:  1.10 cm  LV e' medial:    7.62 cm/s LVOT diam:      2.20 cm  LV E/e' medial:  10.6 LV SV:         74 LV SV Index:   39 LVOT Area:     3.80 cm  RIGHT VENTRICLE RV Basal diam:  2.80 cm RV S prime:     17.90 cm/s TAPSE (M-mode): 2.0 cm LEFT ATRIUM             Index       RIGHT ATRIUM           Index LA diam:        3.50 cm 1.82 cm/m  RA Area:     14.20 cm LA Vol (A2C):   30.1 ml 15.69 ml/m RA Volume:   36.00 ml  18.77 ml/m LA Vol (A4C):   26.7 ml 13.92 ml/m LA Biplane Vol: 30.2 ml 15.75 ml/m  AORTIC VALVE LVOT Vmax:   127.00 cm/s LVOT Vmean:  89.250 cm/s LVOT VTI:    0.194 m  AORTA Ao Root diam: 3.40 cm Ao Asc diam:  3.90 cm MITRAL VALVE MV Area (PHT): 3.60 cm     SHUNTS MV Decel Time: 211 msec     Systemic VTI:  0.19 m MV E velocity: 80.50 cm/s   Systemic Diam: 2.20 cm MV A velocity: 130.00 cm/s MV E/A ratio:  0.62 Loralie Champagne MD Electronically signed by Loralie Champagne MD Signature Date/Time: 09/09/2019/4:52:45 PM    Final    VAS [place] UPPER EXTREMITY VENOUS DUPLEX  Result Date: 09/12/2019 UPPER VENOUS STUDY  Indications: Edema Comparison Study: No prior studies. Performing Technologist: Darlin Coco  Examination Guidelines: A complete evaluation includes B-mode imaging, spectral Doppler, color Doppler, and power Doppler as needed of all accessible portions of each vessel. Bilateral testing is considered an integral part of a complete examination. Limited examinations for reoccurring indications may be performed as noted.  Right Findings: +----------+------------+---------+-----------+----------+-------+ RIGHT     CompressiblePhasicitySpontaneousPropertiesSummary +----------+------------+---------+-----------+----------+-------+ Subclavian    Full       Yes       Yes                      +----------+------------+---------+-----------+----------+-------+  Left Findings: +----------+------------+---------+-----------+----------+---------------------+ LEFT      CompressiblePhasicitySpontaneousProperties       Summary         +----------+------------+---------+-----------+----------+---------------------+ IJV           Full       Yes       Yes                                    +----------+------------+---------+-----------+----------+---------------------+ Subclavian    Full       Yes       Yes                                    +----------+------------+---------+-----------+----------+---------------------+ Axillary      Full       Yes       Yes                                    +----------+------------+---------+-----------+----------+---------------------+ Brachial      Full                                                        +----------+------------+---------+-----------+----------+---------------------+  Radial        Full                                                        +----------+------------+---------+-----------+----------+---------------------+ Ulnar         Full                                                        +----------+------------+---------+-----------+----------+---------------------+ Cephalic      Full                                                        +----------+------------+---------+-----------+----------+---------------------+ Basilic       Full                                    Branch of basilic                                                        non-compressible at                                                         affected area.     +----------+------------+---------+-----------+----------+---------------------+  Summary:  Right: No evidence of thrombosis in the subclavian.  Left: No evidence of deep vein thrombosis in the upper extremity. Branch of basilic non-compressible at affected area.  *See table(s) above for measurements and observations.    Preliminary     Microbiology: Recent Results (from the past 240 hour(s))  SARS Coronavirus 2 by RT PCR (hospital order, performed in [facility] hospital lab)  Nasopharyngeal Nasopharyngeal Swab     Status: None   Collection Time: 09/08/19  4:23 PM   Specimen: Nasopharyngeal Swab  Result Value Ref Range Status   SARS Coronavirus 2 NEGATIVE NEGATIVE Final    Comment: (NOTE) SARS-CoV-2 target nucleic acids are NOT DETECTED.  The SARS-CoV-2 RNA is generally detectable in upper and lower respiratory specimens during the acute phase of infection. The lowest concentration of SARS-CoV-2 viral copies this assay can detect is 250 copies / mL. A negative result does not preclude SARS-CoV-2 infection and should not be used as the sole basis for treatment or other patient management decisions.  A negative result may occur with improper specimen collection / handling, submission of specimen other than nasopharyngeal swab, presence of viral mutation(s) within the areas targeted by this assay, and inadequate number of viral copies (<250 copies / mL). A negative result must be combined with clinical observations, patient history, and epidemiological information.  Fact Sheet for Patients:   StrictlyIdeas.no  Fact Sheet for Healthcare Providers: BankingDealers.co.za  This test is not yet approved or  cleared by the [place] FDA and has been authorized for detection and/or diagnosis of SARS-CoV-2 by FDA under an Emergency Use Authorization (EUA).  This EUA will remain in effect (meaning this test can be used) for the duration of the COVID-19 declaration under Section 564(b)(1) of the Act, 21 U.S.C. section 360bbb-3(b)(1), unless the authorization is terminated or revoked sooner.  Performed at [facility], [place] [address], [place], [place] [address]   MRSA PCR Screening     Status: None   Collection Time: 09/10/19  2:00 AM   Specimen: Nasal Mucosa; Nasopharyngeal  Result Value Ref Range Status   MRSA by PCR NEGATIVE NEGATIVE Final    Comment:        The GeneXpert MRSA Assay  (FDA approved for NASAL specimens only), is one component of a comprehensive MRSA colonization surveillance program. It is not intended to diagnose MRSA infection nor to guide or monitor treatment for MRSA infections. Performed at [facility], [place] [address] [place], [place] [address]      Labs: Basic Metabolic Panel: Recent Labs  Lab 09/08/19 1623 09/09/19 0432 09/10/19 0430 09/11/19 0448 09/12/19 0448  NA 138 137 138 140 138  K 4.4 4.6 4.1 4.2 3.8  CL 101 101 102 105 100  CO2 29 27 27 30 29   GLUCOSE 135* 135* 124* 104* 103*  BUN 19 18 20 19 15   CREATININE 0.67 0.55 0.58 0.68 0.77  CALCIUM 9.5 9.6 10.1 9.6 9.4  MG  --   --   --   --  2.1   Liver Function Tests: Recent Labs  Lab 09/08/19 1623 09/09/19 0432 09/10/19 0430 09/11/19 0448 09/12/19 0448  AST 22 19 15  12* 12*  ALT 50* 46* 38 29 25  ALKPHOS 57 53 48 48 46  BILITOT 0.3 0.7 0.6 0.3 0.4  PROT 6.5 6.1* 6.2* 5.8* 5.4*  ALBUMIN 3.7 3.4* 3.5 3.3* 3.0*   No results for input(s): LIPASE, AMYLASE in the last 168 hours. No results for input(s): AMMONIA in the last 168 hours. CBC: Recent Labs  Lab 09/08/19 1623 09/09/19 0432 09/10/19 0430 09/11/19 0448 09/12/19 0448  WBC 16.8* 14.5* 18.9* 12.7* 12.2*  NEUTROABS  --  13.3* 15.7* 8.6* 8.2*  HGB 13.2 11.8* 12.1 12.3 12.2  HCT 41.7 37.5 38.0 39.0 38.7  MCV 101.7* 101.9* 101.3* 103.7* 102.7*  PLT 267 239 285 246 243   Cardiac Enzymes: No results for input(s): CKTOTAL, CKMB, CKMBINDEX, TROPONINI in the last 168 hours. BNP: BNP (last 3 results) No results for input(s): BNP in the last 8760 hours.  ProBNP (last 3 results) No results for input(s): PROBNP in the last 8760 hours.  CBG: No results for input(s): GLUCAP in the last 168 hours.     Signed:  Florencia Reasons MD, PhD, FACP  [facility] 09/12/2019, 2:44 PM

## 2019-09-12 NOTE — Progress Notes (Signed)
Report called to Garvin Fila, RN at [facility].  [facility] transporting patient to CIR.  Belongings sent with patient.  Patient stable at time of [facility] transport.

## 2019-09-12 NOTE — H&P (Deleted)
Physical Medicine and Rehabilitation Admission H&P    Chief Complaint  Patient presents with  . COPD exacerbation  : HPI: Julia Daniels is a 69 year old right-handed female with history of COPD who quit smoking 2 years ago and multiple admissions for COPD exacerbation, anxiety disorder.  Per chart review patient lives with her family.  1 level home 4 steps to entry.  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  Presented 09/08/2019 with increasing shortness of breath as well as wheezing.  CT angiogram of the chest no evidence of pulmonary emboli.  Marked severity coronary artery calcification.  Compression fracture deformity of indeterminate age involving the T6 vertebral body.  CT abdomen pelvis showed no acute intra-abdominal or intrapelvic process.  Prominent lower lumbar spondylosis greatest at L4-5 and L5-S1.  CT lumbar spine no acute bony abnormality.  Admission chemistries unremarkable, SARS current virus negative, troponin negative, urinalysis negative nitrite.  Interventional radiology consulted for possible kyphoplasty T6 and was not recommended at this time advised conservative care and fitted with a TLSO back brace.  Vascular study right upper extremity for some edema negative for DVT.  Maintained on Lovenox for DVT prophylaxis.  Nebulizer treatments ongoing for her COPD.  Pain managed with Lidoderm patch, scheduled baclofen 10 mg 3 times daily as well as hydrocodone as needed.  Therapy evaluations completed and patient was admitted for comprehensive rehab program.  Review of Systems  Constitutional: Negative for chills and fever.  HENT: Negative for hearing loss.   Eyes: Negative for blurred vision and double vision.  Respiratory: Positive for shortness of breath and wheezing.   Cardiovascular: Negative for chest pain, palpitations and leg swelling.  Gastrointestinal: Positive for constipation. Negative for heartburn, nausea and vomiting.    Genitourinary: Negative for dysuria and hematuria.  Musculoskeletal: Positive for back pain and myalgias.  Skin: Negative for rash.  Psychiatric/Behavioral: The patient has insomnia.        Anxiety  All other systems reviewed and are negative.  Past Medical History:  Diagnosis Date  . Anxiety   . Asthma   . COPD (chronic obstructive pulmonary disease) ([place])   . Depression    Past Surgical History:  Procedure Laterality Date  . ABDOMINAL HYSTERECTOMY     partial    Family History  Problem Relation Age of Onset  . Asthma Mother   . Lung cancer Father   . COPD Father    Social History:  reports that she quit smoking about 2 years ago. Her smoking use included cigarettes. She has a 40.00 pack-year smoking history. She has never used smokeless tobacco. She reports that she does not drink alcohol and does not use drugs. Allergies:  Allergies  Allergen Reactions  . Sulfa Antibiotics Other (See Comments)    Mouth gets raw   Medications Prior to Admission  Medication Sig Dispense Refill  . albuterol (PROAIR HFA) 108 (90 Base) MCG/ACT inhaler Inhale 2 puffs into the lungs every 6 (six) hours as needed for wheezing or shortness of breath. 18 g 5  . ALPRAZolam (XANAX) 0.25 MG tablet Take 0.25 mg by mouth daily as needed for anxiety.   1  . baclofen (LIORESAL) 10 MG tablet Take 10 mg by mouth 3 (three) times daily as needed.    . bismuth subsalicylate (PEPTO BISMOL) 262 MG chewable tablet Chew 262-524 mg by mouth daily as needed for diarrhea or loose stools.    . budesonide-formoterol (SYMBICORT) 160-4.5 MCG/ACT inhaler INHALE 2 PUFFS INTO  LUNGS TWICE A DAY (Patient taking differently: Inhale 2 puffs into the lungs in the morning and at bedtime. ) 30.6 g 2  . [EXPIRED] dextromethorphan-guaiFENesin (MUCINEX DM) 30-600 MG 12hr tablet Take 1 tablet by mouth 2 (two) times daily as needed for up to 7 days for cough. 14 tablet 0  . EPINEPHrine 0.3 mg/0.3 mL IJ SOAJ injection Inject 0.3 mLs  (0.3 mg total) into the muscle as needed for anaphylaxis. 1 each 1  . famotidine (PEPCID) 20 MG tablet One at bedtime (Patient taking differently: Take 20 mg by mouth at bedtime. )    . FASENRA 30 MG/ML SOSY INJECT 1 SYRINGE UNDER THE SKIN EVERY 8 WEEKS. (Patient taking differently: Inject 30 mg into the skin every 8 (eight) weeks. ) 1 mL 6  . HYDROcodone-acetaminophen (NORCO/VICODIN) 5-325 MG tablet Take 1 tablet by mouth in the morning, at noon, in the evening, and at bedtime.     Marland Kitchen ibuprofen (ADVIL) 800 MG tablet Take 800 mg by mouth 2 (two) times daily as needed for pain.    Marland Kitchen lamoTRIgine (LAMICTAL) 200 MG tablet Take 200 mg by mouth at bedtime.     Marland Kitchen loratadine (CLARITIN) 10 MG tablet Take 10 mg by mouth every evening.     . methylphenidate 18 MG PO CR tablet Take 18 mg by mouth daily. Take with 54 mg tablet= 72mg     . methylphenidate 54 MG PO CR tablet Take 54 mg by mouth every morning. Take with 18 mg tablet= 72mg     . montelukast (SINGULAIR) 10 MG tablet Take 1 tablet (10 mg total) by mouth at bedtime. 30 tablet 11  . pantoprazole (PROTONIX) 40 MG tablet Take 1 tablet (40 mg total) by mouth daily. Take 30-60 min before first meal of the day 30 tablet 11  . predniSONE (DELTASONE) 10 MG tablet Take 5 tablets (50 mg total) by mouth daily for 3 days, THEN 4 tablets (40 mg total) daily for 3 days, THEN 3 tablets (30 mg total) daily for 3 days, THEN 2 tablets (20 mg total) daily for 3 days, THEN 1 tablet (10 mg total) daily for 3 days. 45 tablet 0  . QUEtiapine (SEROQUEL) 100 MG tablet Take 150 mg by mouth at bedtime.    . risperiDONE (RISPERDAL) 1 MG tablet Take 3 mg by mouth at bedtime.   3  . Tiotropium Bromide Monohydrate (SPIRIVA RESPIMAT) 2.5 MCG/ACT AERS Inhale 2 puffs into the lungs daily. 1 each 0  . venlafaxine XR (EFFEXOR-XR) 150 MG 24 hr capsule Take 150 mg by mouth at bedtime.     . Vitamin D, Ergocalciferol, (DRISDOL) 50000 units CAPS capsule Take 50,000 Units by mouth every Saturday.    0  . Spacer/Aero-Holding Chambers (AEROCHAMBER MV) inhaler Use as instructed 1 each 0  . Tiotropium Bromide Monohydrate (SPIRIVA RESPIMAT) 2.5 MCG/ACT AERS Inhale 2 puffs into the lungs daily. (Patient not taking: Reported on 09/08/2019) 12 g 2    Drug Regimen Review Drug regimen was reviewed and remains appropriate with no significant issues identified  Home: Home Living Family/patient expects to be discharged to:: Private residence Living Arrangements: Children Available Help at Discharge: Family, Available PRN/intermittently Type of Home: House Home Access: Stairs to enter Technical brewer of Steps: 4 Entrance Stairs-Rails: Right Home Layout: One level Bathroom Shower/Tub: Chiropodist: Standard Home Equipment: None   Functional History: Prior Function Level of Independence: Independent Comments: 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  Functional Status:  Mobility: Bed  Mobility Overal bed mobility: Needs Assistance Bed Mobility: Rolling, Sidelying to Sit, Sit to Sidelying, Sit to Supine Rolling: Min guard Sidelying to sit: Min guard, HOB elevated Sit to sidelying: Mod assist General bed mobility comments: patient familiar with log roll technique, requiring assist when returning to supine to lift LEs back onto bed. patient reports bed mobility is easier this time due to medicated for pain Transfers Overall transfer level: Needs assistance Equipment used: None Transfers: Stand Pivot Transfers, Sit to/from Stand Sit to Stand: Min guard Stand pivot transfers: Min guard General transfer comment: min guard for safety due to decreased stability and activity tolerance Ambulation/Gait Ambulation/Gait assistance: Supervision, Min guard Gait Distance (Feet): 25 Feet (to and from bathroom only) Assistive device: Rolling walker (2 wheeled) Gait Pattern/deviations: Step-through pattern, Decreased stride length General Gait Details: pt was only able to  amb to and from bathroom due to back pain despite TLSO applied Gait velocity: decr    ADL: ADL Overall ADL's : Needs assistance/impaired Eating/Feeding: Independent Grooming: Set up, Sitting Upper Body Bathing: Set up, Sitting Lower Body Bathing: Sit to/from stand, Moderate assistance Upper Body Dressing : Set up, Sitting Lower Body Dressing: Moderate assistance, Sitting/lateral leans Lower Body Dressing Details (indicate cue type and reason): significant back pain Toilet Transfer: Stand-pivot, BSC, Min guard Toilet Transfer Details (indicate cue type and reason): decreased activity tolerance due to back pain and dyspnea with exertion  Toileting- Clothing Manipulation and Hygiene: Set up, Sitting/lateral lean Functional mobility during ADLs: Min guard General ADL Comments: patient limited by activity tolerance, strength, back pain requiring increased assistance with self care  Cognition: Cognition Overall Cognitive Status: Within Functional Limits for tasks assessed Orientation Level: Oriented X4 Cognition Arousal/Alertness: Awake/alert Behavior During Therapy: WFL for tasks assessed/performed Overall Cognitive Status: Within Functional Limits for tasks assessed General Comments: AxO x 3 pain improved with meds pt feeling better  Physical Exam: Blood pressure 115/72, pulse (!) 104, temperature 98.9 F (37.2 C), temperature source Oral, resp. rate 15, height 5\' 3"  (1.6 m), weight 89 kg, SpO2 98 %. Physical Exam General: Alert and oriented x 3, easily arousable HEENT: Head is normocephalic, atraumatic, PERRLA, EOMI, sclera anicteric, oral mucosa pink and moist, dentition intact, ext ear canals clear,  Neck: Supple without JVD or lymphadenopathy Heart: Tachycardic. No murmurs rubs or gallops Chest: +wheezing Abdomen: Soft, non-tender, non-distended, bowel sounds positive. Extremities: No clubbing, cyanosis, or edema. Pulses are 2+ Skin: Left forearm edema Neuro: Patient is  alert no acute distress oriented x3 and follows commands. TLSO in place Psych: Pt's affect is appropriate. Pt is cooperative   Results for orders placed or performed during the hospital encounter of 09/08/19 (from the past 48 hour(s))  Urinalysis, Routine w reflex microscopic Urine, Random     Status: None   Collection Time: 09/10/19  1:02 PM  Result Value Ref Range   Color, Urine YELLOW YELLOW   APPearance CLEAR CLEAR   Specific Gravity, Urine 1.018 1.005 - 1.030   pH 5.0 5.0 - 8.0   Glucose, UA NEGATIVE NEGATIVE mg/dL   Hgb urine dipstick NEGATIVE NEGATIVE   Bilirubin Urine NEGATIVE NEGATIVE   Ketones, ur NEGATIVE NEGATIVE mg/dL   Protein, ur NEGATIVE NEGATIVE mg/dL   Nitrite NEGATIVE NEGATIVE   Leukocytes,Ua NEGATIVE NEGATIVE    Comment: Performed at [place] [address], [place], [place] [address]  CBC with Differential/Platelet     Status: Abnormal   Collection Time: 09/11/19  4:48 AM  Result Value Ref Range  WBC 12.7 (H) 4.0 - 10.5 K/uL   RBC 3.76 (L) 3.87 - 5.11 MIL/uL   Hemoglobin 12.3 12.0 - 15.0 g/dL   HCT 39.0 36 - 46 %   MCV 103.7 (H) 80.0 - 100.0 fL   MCH 32.7 26.0 - 34.0 pg   MCHC 31.5 30.0 - 36.0 g/dL   RDW 14.6 11.5 - 15.5 %   Platelets 246 150 - 400 K/uL   nRBC 0.0 0.0 - 0.2 %   Neutrophils Relative % 68 %   Neutro Abs 8.6 (H) 1.7 - 7.7 K/uL   Lymphocytes Relative 22 %   Lymphs Abs 2.8 0.7 - 4.0 K/uL   Monocytes Relative 9 %   Monocytes Absolute 1.1 (H) 0 - 1 K/uL   Eosinophils Relative 0 %   Eosinophils Absolute 0.0 0 - 0 K/uL   Basophils Relative 0 %   Basophils Absolute 0.0 0 - 0 K/uL   Immature Granulocytes 1 %   Abs Immature Granulocytes 0.15 (H) 0.00 - 0.07 K/uL    Comment: Performed at [facility], [place] [address] [place], [place] [address]  Comprehensive metabolic panel     Status: Abnormal   Collection Time: 09/11/19  4:48 AM  Result Value Ref Range   Sodium 140 135 - 145 mmol/L   Potassium 4.2  3.5 - 5.1 mmol/L   Chloride 105 98 - 111 mmol/L   CO2 30 22 - 32 mmol/L   Glucose, Bld 104 (H) 70 - 99 mg/dL    Comment: Glucose reference range applies only to samples taken after fasting for at least 8 hours.   BUN 19 8 - 23 mg/dL   Creatinine, Ser 0.68 0.44 - 1.00 mg/dL   Calcium 9.6 8.9 - 10.3 mg/dL   Total Protein 5.8 (L) 6.5 - 8.1 g/dL   Albumin 3.3 (L) 3.5 - 5.0 g/dL   AST 12 (L) 15 - 41 U/L   ALT 29 0 - 44 U/L   Alkaline Phosphatase 48 38 - 126 U/L   Total Bilirubin 0.3 0.3 - 1.2 mg/dL   GFR calc non Af Amer >60 >60 mL/min   GFR calc Af Amer >60 >60 mL/min   Anion gap 5 5 - 15    Comment: Performed at [facility], [place] [address] [place], [place] [address]  CBC with Differential/Platelet     Status: Abnormal   Collection Time: 09/12/19  4:48 AM  Result Value Ref Range   WBC 12.2 (H) 4.0 - 10.5 K/uL   RBC 3.77 (L) 3.87 - 5.11 MIL/uL   Hemoglobin 12.2 12.0 - 15.0 g/dL   HCT 38.7 36 - 46 %   MCV 102.7 (H) 80.0 - 100.0 fL   MCH 32.4 26.0 - 34.0 pg   MCHC 31.5 30.0 - 36.0 g/dL   RDW 14.0 11.5 - 15.5 %   Platelets 243 150 - 400 K/uL   nRBC 0.0 0.0 - 0.2 %   Neutrophils Relative % 68 %   Neutro Abs 8.2 (H) 1.7 - 7.7 K/uL   Lymphocytes Relative 23 %   Lymphs Abs 2.8 0.7 - 4.0 K/uL   Monocytes Relative 8 %   Monocytes Absolute 1.0 0 - 1 K/uL   Eosinophils Relative 0 %   Eosinophils Absolute 0.0 0 - 0 K/uL   Basophils Relative 0 %   Basophils Absolute 0.0 0 - 0 K/uL   Immature Granulocytes 1 %   Abs Immature Granulocytes 0.16 (H) 0.00 - 0.07  K/uL    Comment: Performed at [facility], [place] [address], [place], [place] [address]  Comprehensive metabolic panel     Status: Abnormal   Collection Time: 09/12/19  4:48 AM  Result Value Ref Range   Sodium 138 135 - 145 mmol/L   Potassium 3.8 3.5 - 5.1 mmol/L   Chloride 100 98 - 111 mmol/L   CO2 29 22 - 32 mmol/L   Glucose, Bld 103 (H) 70 - 99 mg/dL    Comment: Glucose reference range  applies only to samples taken after fasting for at least 8 hours.   BUN 15 8 - 23 mg/dL   Creatinine, Ser 0.77 0.44 - 1.00 mg/dL   Calcium 9.4 8.9 - 10.3 mg/dL   Total Protein 5.4 (L) 6.5 - 8.1 g/dL   Albumin 3.0 (L) 3.5 - 5.0 g/dL   AST 12 (L) 15 - 41 U/L   ALT 25 0 - 44 U/L   Alkaline Phosphatase 46 38 - 126 U/L   Total Bilirubin 0.4 0.3 - 1.2 mg/dL   GFR calc non Af Amer >60 >60 mL/min   GFR calc Af Amer >60 >60 mL/min   Anion gap 9 5 - 15    Comment: Performed at [facility], [place] [address], [place], [place] [address]  Lipid panel     Status: Abnormal   Collection Time: 09/12/19  4:48 AM  Result Value Ref Range   Cholesterol 282 (H) 0 - 200 mg/dL   Triglycerides 131 <150 mg/dL   HDL 62 >40 mg/dL   Total CHOL/HDL Ratio 4.5 RATIO   VLDL 26 0 - 40 mg/dL   LDL Cholesterol 194 (H) 0 - 99 mg/dL    Comment:        Total Cholesterol/HDL:CHD Risk Coronary Heart Disease Risk Table                     Men   Women  1/2 Average Risk   3.4   3.3  Average Risk       5.0   4.4  2 X Average Risk   9.6   7.1  3 X Average Risk  23.4   11.0        Use the calculated Patient Ratio above and the CHD Risk Table to determine the patient's CHD Risk.        ATP III CLASSIFICATION (LDL):  <100     mg/dL   Optimal  100-129  mg/dL   Near or Above                    Optimal  130-159  mg/dL   Borderline  160-189  mg/dL   High  >190     mg/dL   Very High Performed at [place] [address], [place], [place] [address]   Magnesium     Status: None   Collection Time: 09/12/19  4:48 AM  Result Value Ref Range   Magnesium 2.1 1.7 - 2.4 mg/dL    Comment: Performed at [facility], [place] [address] [place], [place] [address]   VAS [place] UPPER EXTREMITY VENOUS DUPLEX  Result Date: 09/12/2019 UPPER VENOUS STUDY  Indications: Edema Comparison Study: No prior studies. Performing Technologist: Darlin Coco  Examination Guidelines: A complete  evaluation includes B-mode imaging, spectral Doppler, color Doppler, and power Doppler as needed of all accessible portions of each vessel. Bilateral testing is considered an integral part  of a complete examination. Limited examinations for reoccurring indications may be performed as noted.  Right Findings: +----------+------------+---------+-----------+----------+-------+ RIGHT     CompressiblePhasicitySpontaneousPropertiesSummary +----------+------------+---------+-----------+----------+-------+ Subclavian    Full       Yes       Yes                      +----------+------------+---------+-----------+----------+-------+  Left Findings: +----------+------------+---------+-----------+----------+---------------------+ LEFT      CompressiblePhasicitySpontaneousProperties       Summary        +----------+------------+---------+-----------+----------+---------------------+ IJV           Full       Yes       Yes                                    +----------+------------+---------+-----------+----------+---------------------+ Subclavian    Full       Yes       Yes                                    +----------+------------+---------+-----------+----------+---------------------+ Axillary      Full       Yes       Yes                                    +----------+------------+---------+-----------+----------+---------------------+ Brachial      Full                                                        +----------+------------+---------+-----------+----------+---------------------+ Radial        Full                                                        +----------+------------+---------+-----------+----------+---------------------+ Ulnar         Full                                                        +----------+------------+---------+-----------+----------+---------------------+ Cephalic      Full                                                         +----------+------------+---------+-----------+----------+---------------------+ Basilic       Full                                    Branch of basilic  non-compressible at                                                         affected area.     +----------+------------+---------+-----------+----------+---------------------+  Summary:  Right: No evidence of thrombosis in the subclavian.  Left: No evidence of deep vein thrombosis in the upper extremity. Branch of basilic non-compressible at affected area.  *See table(s) above for measurements and observations.    Preliminary    Medical Problem List and Plan: 1.  Decreased functional ability secondary to recurrent COPD exacerbation with compression fracture T6.  Back brace when out of bed..  Continue nebulizer treatments as directed.  Prednisone tapered  -patient may shower  -ELOS/Goals: modI 8-9 days 2.  Antithrombotics: -DVT/anticoagulation: Lovenox  -antiplatelet therapy: N/A 3. Pain Management: Baclofen 10 mg 3 times daily, Lidoderm patch, hydrocodone as needed. Log roll to minimize back pain.  4. Mood: Lamictal 200 mg nightly, Effexor 921 mg daily Concerta 72 mg daily Xanax as needed  -antipsychotic agents: Seroquel 150 mg nightly, Risperdal 3 mg nightly 5. Neuropsych: This patient is capable of making decisions on her own behalf. 6. Skin/Wound Care: Routine skin checks 7. Fluids/Electrolytes/Nutrition: Routine in and outs with follow-up chemistries. 8. Acute on chronic respiratory failure with hypoxia: continue to monitor saturations closely with therapy. Wean oxygen as tolerated.   Lavon Paganini Angiulli, PA-C 09/12/2019   I have personally performed a face to face diagnostic evaluation, including, but not limited to relevant history and physical exam findings, of this patient and developed relevant assessment and plan.  Additionally, I have reviewed and concur with  the physician assistant's documentation above.  Leeroy Cha, MD

## 2019-09-12 NOTE — Progress Notes (Signed)
[facility] re[facility] admissions - I spoke with patient by phone today.  She would like to come to inpatient rehab.  I have approval from her [employer] for acute inpatient rehab admission.  I will contact attending MD.  I do have a bed on CIR available today.  Call me for questions.  [phone]

## 2019-09-12 NOTE — Progress Notes (Signed)
Upper extremity venous LT study completed  Preliminary results relayed to Erlinda Hong, MD.  See CV Proc for preliminary results report.   Julia Daniels

## 2019-09-12 NOTE — Progress Notes (Addendum)
Inpatient Rehabilitation Medication Review by a Pharmacist  A complete drug regimen review was completed for this patient to identify any potential clinically significant medication issues.  Clinically significant medication issues were identified:  yes   Type of Medication Issue Identified Description of Issue Urgent (address now) Non-Urgent (address on AM team rounds) Plan Plan Accepted by Provider? (Yes / No / Pending AM Rounds)                                Additional Drug Therapy Needed  Atorvastatin ordered on dc summary but not in rehab, prednisone taper regimen Non-urgent Message team in AM Pending  Other         Name of provider notified for urgent issues identified: Message team in AM  Provider Method of Notification: Secure chat   For non-urgent medication issues to be resolved on team rounds tomorrow morning a [facility] Secure Chat Handoff was sent to:    Pharmacist comments:   Time spent performing this drug regimen review (minutes):  Rupert 09/12/2019 8:06 PM

## 2019-09-12 NOTE — Progress Notes (Signed)
Patient presented to [facility] via stretcher from [facility] with two attendees. Patient is excited to start therapy.

## 2019-09-13 ENCOUNTER — Inpatient Hospital Stay (HOSPITAL_COMMUNITY): Payer: BC Managed Care – PPO | Admitting: Physical Therapy

## 2019-09-13 ENCOUNTER — Inpatient Hospital Stay (HOSPITAL_COMMUNITY): Payer: BC Managed Care – PPO

## 2019-09-13 DIAGNOSIS — R5381 Other malaise: Principal | ICD-10-CM

## 2019-09-13 MED ORDER — ATORVASTATIN CALCIUM 40 MG PO TABS
40.0000 mg | ORAL_TABLET | Freq: Every day | ORAL | Status: DC
  Administered 2019-09-13 – 2019-09-27 (×15): 40 mg via ORAL
  Filled 2019-09-13 (×15): qty 1

## 2019-09-13 MED ORDER — WHITE PETROLATUM EX OINT
TOPICAL_OINTMENT | CUTANEOUS | Status: AC
  Filled 2019-09-13: qty 28.35

## 2019-09-13 MED ORDER — PREDNISONE 10 MG PO TABS
10.0000 mg | ORAL_TABLET | Freq: Every day | ORAL | Status: AC
  Administered 2019-09-16: 10 mg via ORAL
  Filled 2019-09-13: qty 1

## 2019-09-13 MED ORDER — PREDNISONE 20 MG PO TABS
30.0000 mg | ORAL_TABLET | Freq: Every day | ORAL | Status: AC
  Administered 2019-09-14: 30 mg via ORAL
  Filled 2019-09-13: qty 1

## 2019-09-13 MED ORDER — PREDNISONE 20 MG PO TABS
20.0000 mg | ORAL_TABLET | Freq: Every day | ORAL | Status: AC
  Administered 2019-09-15: 20 mg via ORAL
  Filled 2019-09-13: qty 1

## 2019-09-13 NOTE — Progress Notes (Signed)
Physical Therapy Session Note  Patient Details  Name: Julia Daniels MRN: [id number] Date of Birth: Mar 12, 1950  Today's Date: 09/13/2019 PT Individual Time: 1287-8676 PT Individual Time Calculation (min): 18 min   and  Today's Date: 09/13/2019 PT Missed Time: 12 Minutes Missed Time Reason: Pain  Short Term Goals: Week 1:  PT Short Term Goal 1 (Week 1): Pt will perform supine<>sit with min assist, not using bed features PT Short Term Goal 2 (Week 1): Pt will perform stand pivot transfers with supervision PT Short Term Goal 3 (Week 1): Pt will ambulate at least 49ft using LRAD with supervision PT Short Term Goal 4 (Week 1): Pt will ascend/descend 4 steps using R HR only with CGA  Skilled Therapeutic Interventions/Progress Updates:    Pt received supine in bed stating "I have been lying here trying to motivate myself to work with you because the pain is so bad." Pt reports RN is unable to provide medication for another 41minutes. Despite this patient reports she really wants to try and do the therapy session. Received and maintained on 2L of O2 via nasal cannula during session. Supine>sitting R EOB via logroll, HOB elevated and using bedrail, with supervision. Sitting EOB donned TLSO with mod assist. Pt reports increasing pain but states sitting is always one of the more uncomfortable positions. Sit>stand, no AD with CGA for safety but then pt states she is "really hurting" and therapist provided emotional support and discussed whether pt felt that participating in therapy would cause her pain to elevate to an unmanageable level. Pt states that she really wanted to participate in therapy session but that her pain is not tolerable. Sitting EOB doffed TLSO. Sit>supine, HOB partially elevated and using bedrail, with min assist for B LE management into bed via reverse logroll - pt has to keep knees tucked together up towards her chest for pain control during this task. Supine scoot towards HOB with +2  assist via bed pad as pt has increased pain when attempting to scoot in bed. RN present for medication administration. Pt left supine in bed with needs in reach, lines intact, and bed alarm on. Missed 12 minutes of skilled physical therapy.  Therapy Documentation Precautions:  Precautions Precautions: Fall Precaution Comments: new T6 comp Fx and COPD (home O2 at night/PRN day) Required Braces or Orthoses: Spinal Brace Spinal Brace: Thoracolumbosacral orthotic, Applied in sitting position Restrictions Weight Bearing Restrictions: No Other Position/Activity Restrictions: Don/doff EOB for self teaching  Pain: Reports 9/10 thoracic spine pain that increases with mobility - RN provided medication - therapist assisted with repositioning for pain management.   Therapy/Group: Individual Therapy  Tawana Scale , PT, DPT, CSRS  09/13/2019, 5:48 PM

## 2019-09-13 NOTE — Evaluation (Signed)
Occupational Therapy Assessment and Plan  Patient Details  Name: Julia Daniels MRN: [id number] Date of Birth: 06/18/1950  OT Diagnosis: abnormal posture, acute pain, lumbago (low back pain), muscle weakness (generalized) and swelling of limb Rehab Potential: Rehab Potential (ACUTE ONLY): Good ELOS: 12-16 days   Today's Date: 09/13/2019 OT Individual Time: 0737-1062 OT Individual Time Calculation (min): 75 min     Hospital Problem: Active Problems:   COPD exacerbation ([place])   Past Medical History:  Past Medical History:  Diagnosis Date   Anxiety    Asthma    COPD (chronic obstructive pulmonary disease) ([place])    Depression    Past Surgical History:  Past Surgical History:  Procedure Laterality Date   ABDOMINAL HYSTERECTOMY     partial     Assessment & Plan Clinical Impression: Julia Daniels is a 69 y.o. year-old female with a history of COPD presenting to the ED8/30/  with chief complaint of shortness of breath.Admitted 8/18 through 09/02/2019 with a COPD exacerbation. Noted T6 compression fracture to be treated with TLSO or vertebroplasty  Patient currently requires max with basic self-care skills secondary to muscle weakness, decreased cardiorespiratoy endurance and decreased sitting balance, decreased standing balance, decreased postural control, decreased balance strategies and difficulty maintaining precautions.  Prior to hospitalization, patient could complete BADL with modified independent .  Patient will benefit from skilled intervention to decrease level of assist with basic self-care skills and increase independence with basic self-care skills prior to discharge home with care partner.  Anticipate patient will require intermittent supervision and follow up home health.  OT - End of Session Activity Tolerance: Tolerates 10 - 20 min activity with multiple rests OT Assessment Rehab Potential (ACUTE ONLY): Good OT Barriers to Discharge: New  oxygen;Inaccessible home environment;Decreased caregiver support;Lack of/limited family support;Weight OT Patient demonstrates impairments in the following area(s): Balance;Edema;Endurance;Motor;Pain;Safety OT Basic ADL's Functional Problem(s): Grooming;Bathing;Dressing;Toileting OT Transfers Functional Problem(s): Toilet;Tub/Shower OT Additional Impairment(s): None OT Plan OT Intensity: Minimum of 1-2 x/day, 45 to 90 minutes OT Frequency: 5 out of 7 days OT Duration/Estimated Length of Stay: 12-16 days OT Treatment/Interventions: Discharge planning;Balance/vestibular training;Pain management;Self Care/advanced ADL retraining;Therapeutic Activities;UE/LE Coordination activities;Disease mangement/prevention;Functional mobility training;Patient/family education;Skin care/wound managment;Therapeutic Exercise;Wheelchair propulsion/positioning;UE/LE Strength taining/ROM;Splinting/orthotics;Psychosocial support;Neuromuscular re-education;DME/adaptive equipment instruction;Community reintegration OT Self Feeding Anticipated Outcome(s): MOD I OT Basic Self-Care Anticipated Outcome(s): MOD I OT Toileting Anticipated Outcome(s): MOD I toilet; S shower OT Bathroom Transfers Anticipated Outcome(s): MOD I toilet; S shower OT Recommendation Recommendations for Other Services: Therapeutic Recreation consult Therapeutic Recreation Interventions: Pet therapy;Kitchen group;Stress management;Outing/community reintergration Patient destination: Home Follow Up Recommendations: Home health OT Equipment Recommended: 3 in 1 bedside comode;Tub/shower bench   OT Evaluation Precautions/Restrictions  Precautions Precautions: Fall Precaution Comments: new T 6 comp Fx and COPD (home O2 at night/PRN day) Spinal Brace: Thoracolumbosacral orthotic;Applied in sitting position Restrictions Other Position/Activity Restrictions: Don/doff EOB for self teaching General Chart Reviewed: Yes Family/Caregiver Present:  No Vital Signs Therapy Vitals Temp: 98.8 F (37.1 C) Pulse Rate: 80 Resp: 17 BP: 113/62 Patient Position (if appropriate): Lying Oxygen Therapy SpO2: 96 % O2 Device: Nasal Cannula O2 Flow Rate (L/min): 2 L/min Pain Pain Assessment Pain Scale: 0-10 Pain Score: 5  Pain Type: Surgical pain Pain Location: Back Pain Orientation: Right;Left Pain Descriptors / Indicators: Aching Pain Onset: On-going Patients Stated Pain Goal: 2 Pain Intervention(s): Medication (See eMAR) Home Living/Prior Functioning Home Living Family/patient expects to be discharged to:: Private residence Living Arrangements: Children Available Help at Discharge: Family, Available PRN/intermittently Type of Home:  House Home Access: Stairs to enter Technical brewer of Steps: 4 Entrance Stairs-Rails: Right Home Layout: One level Bathroom Shower/Tub: Tub/shower unit, Industrial/product designer: Yes IADL History Homemaking Responsibilities: Yes Meal Prep Responsibility: No Laundry Responsibility: Secondary Cleaning Responsibility: No Bill Paying/Finance Responsibility: Primary Shopping Responsibility: Secondary Homemaking Comments: has a dog Current License: Yes Mode of Transportation: Car 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 Leisure and Hobbies: likes to [employer] Prior Function Level of Independence: Independent with basic ADLs, Independent with homemaking with ambulation Vision Patient Visual Report: No change from baseline Vision Assessment?: No apparent visual deficits Perception  Perception: Within Functional Limits Praxis Praxis: Intact Cognition Overall Cognitive Status: Within Functional Limits for tasks assessed Arousal/Alertness: Awake/alert Orientation Level: Person;Place;Situation Person: Oriented Place: Oriented Situation: Oriented Year: 2021 Month: September Day of Week: Correct Memory: Appears intact Immediate Memory Recall:  Sock;Blue;Bed Memory Recall Sock: Without Cue Memory Recall Blue: Without Cue Memory Recall Bed: Without Cue Safety/Judgment: Appears intact Sensation Sensation Light Touch: Appears Intact Coordination Gross Motor Movements are Fluid and Coordinated: Yes Fine Motor Movements are Fluid and Coordinated: Yes Finger Nose Finger Test: [employer] Motor  Motor Motor: Abnormal postural alignment and control  Trunk/Postural Assessment  Cervical Assessment Cervical Assessment:  (head forward) Thoracic Assessment Thoracic Assessment:  (TLSO) Lumbar Assessment Lumbar Assessment:  (TLSO) Postural Control Postural Control: Deficits on evaluation  Balance   CGA-MIN A for standing balance; S sitting balnace Extremity/Trunk Assessment RUE Assessment RUE Assessment: Exceptions to [facility] General Strength Comments: fall impacting R shoulder ROM PTA; strength 4/5- generalized weakness LUE Assessment LUE Assessment: Exceptions to [facility] General Strength Comments: generalized weakness  Care Tool Care Tool Self Care Eating   Eating Assist Level: Independent    Oral Care    Oral Care Assist Level: Set up assist    Bathing   Body parts bathed by patient: Chest;Abdomen;Front perineal area;Face Body parts bathed by helper: Right arm;Left arm;Buttocks;Right upper leg;Left upper leg;Right lower leg;Left lower leg   Assist Level: Maximal Assistance - Patient 24 - 49%    Upper Body Dressing(including orthotics)   What is the patient wearing?: Pull over shirt;Orthosis Orthosis activity level: Performed by helper Assist Level: Maximal Assistance - Patient 25 - 49%    Lower Body Dressing (excluding footwear)   What is the patient wearing?: Pants Assist for lower body dressing: Total Assistance - Patient < 25%    Putting on/Taking off footwear   What is the patient wearing?: Non-skid slipper socks Assist for footwear: Dependent - Patient 0%       Care Tool Toileting Toileting activity   Assist for  toileting: Maximal Assistance - Patient 25 - 49%     Care Tool Bed Mobility Roll left and right activity        Sit to lying activity        Lying to sitting edge of bed activity         Care Tool Transfers Sit to stand transfer   Sit to stand assist level: Contact Guard/Touching assist    Chair/bed transfer   Chair/bed transfer assist level: Minimal Assistance - Patient > 75%     Toilet transfer   Assist Level: Minimal Assistance - Patient > 75%     Care Tool Cognition Expression of Ideas and Wants     Understanding Verbal and Non-Verbal Content     Memory/Recall Ability *first 3 days only      Refer to Care Plan for Long Term Goals  SHORT TERM GOAL WEEK 1 OT Short Term Goal 1 (Week 1): Pt will don shirt with S OT Short Term Goal 2 (Week 1): Pt will don orthosis wiht MIN A OT Short Term Goal 3 (Week 1): Pt will stand pivot to BSC/toilet wiht S OT Short Term Goal 4 (Week 1): Pt will thread BLE into pants wiht S and AE PRN OT Short Term Goal 5 (Week 1): Pt will perform funcitonal task for 2 min prior to needing rest break to demo improved activity tolerance  Recommendations for other services: Therapeutic Recreation  Pet therapy, Kitchen group, Stress management and Outing/community reintegration   Skilled Therapeutic Intervention 1;1. Pt received in bed agreeable to OT. Pt with significant back pain (premedicated) throughout session with mobility and ADL. Rest and repositioning provided. Pt completes ADL as stated below after OT educates on OT role/purpose, CIR, ELOS and POC. Ot selects w/c cusion and w/c with increased back support to reduce pain. Pt demonstrates extreme SOB despite VC for pursed lip breathing. O2 >95 on 3L O2. Pt only aboe to tolerate 15-20 min up in w/c for ADL befire needing to return to bed d/t pain. Pt educated on alternative pain management strategies. Pt self selects coloring as a meditation and OT provides materials to completes in spare time.  Exited session with pt setaed in bed, exit alarm on and call light itn reach  ADL ADL Eating: Modified independent Grooming: Setup Where Assessed-Grooming: Sitting at sink Upper Body Bathing: Minimal assistance Where Assessed-Upper Body Bathing: Wheelchair;Sitting at sink Lower Body Bathing: Dependent Where Assessed-Lower Body Bathing: Wheelchair Upper Body Dressing: Maximal assistance Where Assessed-Upper Body Dressing: Sitting at sink;Wheelchair Lower Body Dressing: Dependent Where Assessed-Lower Body Dressing: Sitting at sink;Wheelchair Mobility    CGA-MIN A for stand pivot transfers with RW- VC for hand placement/safety awareness   Discharge Criteria: Patient will be discharged from OT if patient refuses treatment 3 consecutive times without medical reason, if treatment goals not met, if there is a change in medical status, if patient makes no progress towards goals or if patient is discharged from hospital.  The above assessment, treatment plan, treatment alternatives and goals were discussed and mutually agreed upon: by patient  Tonny Branch 09/13/2019, 8:14 AM

## 2019-09-13 NOTE — H&P (Signed)
Physical Medicine and Rehabilitation Admission H&P    No chief complaint on file. Debility due to COPD exacerbation:   HPI: Julia Daniels is a 69 year old right-handed female with history of COPD who quit smoking 2 years ago and multiple admissions for COPD exacerbation, anxiety disorder.  Per chart review patient lives with her family.  1 level home 4 steps to entry.  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  Presented 09/08/2019 with increasing shortness of breath as well as wheezing.  CT angiogram of the chest no evidence of pulmonary emboli.  Marked severity coronary artery calcification.  Compression fracture deformity of indeterminate age involving the T6 vertebral body.  CT abdomen pelvis showed no acute intra-abdominal or intrapelvic process.  Prominent lower lumbar spondylosis greatest at L4-5 and L5-S1.  CT lumbar spine no acute bony abnormality.  Admission chemistries unremarkable, SARS current virus negative, troponin negative, urinalysis negative nitrite.  Interventional radiology consulted for possible kyphoplasty T6 and was not recommended at this time advised conservative care and fitted with a TLSO back brace.  Vascular study right upper extremity for some edema negative for DVT.  Maintained on Lovenox for DVT prophylaxis.  Nebulizer treatments ongoing for her COPD.  Pain managed with Lidoderm patch, scheduled baclofen 10 mg 3 times daily as well as hydrocodone as needed.  Therapy evaluations completed and patient was admitted for comprehensive rehab program.  Review of Systems  Constitutional: Negative for chills and fever.  HENT: Negative for hearing loss.   Eyes: Negative for blurred vision and double vision.  Respiratory: Positive for shortness of breath and wheezing.   Cardiovascular: Negative for chest pain, palpitations and leg swelling.  Gastrointestinal: Positive for constipation. Negative for heartburn, nausea and vomiting.   Genitourinary: Negative for dysuria and hematuria.  Musculoskeletal: Positive for back pain and myalgias.  Skin: Negative for rash.  Psychiatric/Behavioral: The patient has insomnia.        Anxiety  All other systems reviewed and are negative.  Past Medical History:  Diagnosis Date  . Anxiety   . Asthma   . COPD (chronic obstructive pulmonary disease) ([place])   . Depression    Past Surgical History:  Procedure Laterality Date  . ABDOMINAL HYSTERECTOMY     partial    Family History  Problem Relation Age of Onset  . Asthma Mother   . Lung cancer Father   . COPD Father    Social History:  reports that she quit smoking about 2 years ago. Her smoking use included cigarettes. She has a 40.00 pack-year smoking history. She has never used smokeless tobacco. She reports that she does not drink alcohol and does not use drugs. Allergies:  Allergies  Allergen Reactions  . Sulfa Antibiotics Other (See Comments)    Mouth gets raw   Medications Prior to Admission  Medication Sig Dispense Refill  . albuterol (PROAIR HFA) 108 (90 Base) MCG/ACT inhaler Inhale 2 puffs into the lungs every 6 (six) hours as needed for wheezing or shortness of breath. 18 g 5  . albuterol (PROVENTIL) (2.5 MG/3ML) 0.083% nebulizer solution Take 3 mLs (2.5 mg total) by nebulization every 6 (six) hours as needed for wheezing or shortness of breath. 300 mL 12  . ALPRAZolam (XANAX) 0.25 MG tablet Take 0.25 mg by mouth daily as needed for anxiety.   1  . atorvastatin (LIPITOR) 40 MG tablet Take 1 tablet (40 mg total) by mouth daily. 30 tablet 0  . baclofen (LIORESAL) 10 MG  tablet Take 10 mg by mouth 3 (three) times daily as needed.    . bismuth subsalicylate (PEPTO BISMOL) 262 MG chewable tablet Chew 262-524 mg by mouth daily as needed for diarrhea or loose stools.    . budesonide-formoterol (SYMBICORT) 160-4.5 MCG/ACT inhaler INHALE 2 PUFFS INTO LUNGS TWICE A DAY (Patient taking differently: Inhale 2 puffs into the lungs  in the morning and at bedtime. ) 30.6 g 2  . doxycycline (VIBRA-TABS) 100 MG tablet Take 1 tablet (100 mg total) by mouth every 12 (twelve) hours for 2 days.    Marland Kitchen EPINEPHrine 0.3 mg/0.3 mL IJ SOAJ injection Inject 0.3 mLs (0.3 mg total) into the muscle as needed for anaphylaxis. 1 each 1  . famotidine (PEPCID) 20 MG tablet One at bedtime (Patient taking differently: Take 20 mg by mouth at bedtime. )    . FASENRA 30 MG/ML SOSY INJECT 1 SYRINGE UNDER THE SKIN EVERY 8 WEEKS. (Patient taking differently: Inject 30 mg into the skin every 8 (eight) weeks. ) 1 mL 6  . guaiFENesin (MUCINEX) 600 MG 12 hr tablet Take 1 tablet (600 mg total) by mouth 2 (two) times daily.    Marland Kitchen HYDROcodone-acetaminophen (NORCO/VICODIN) 5-325 MG tablet Take 1 tablet by mouth in the morning, at noon, in the evening, and at bedtime.     Marland Kitchen ibuprofen (ADVIL) 800 MG tablet Take 800 mg by mouth 2 (two) times daily as needed for pain.    Marland Kitchen lamoTRIgine (LAMICTAL) 200 MG tablet Take 200 mg by mouth at bedtime.     . lidocaine (LIDODERM) 5 % Place 1 patch onto the skin daily. Remove & Discard patch within 12 hours or as directed by MD 30 patch 0  . loratadine (CLARITIN) 10 MG tablet Take 10 mg by mouth every evening.     . methylphenidate 18 MG PO CR tablet Take 18 mg by mouth daily. Take with 54 mg tablet= 72mg     . methylphenidate 54 MG PO CR tablet Take 54 mg by mouth every morning. Take with 18 mg tablet= 72mg     . montelukast (SINGULAIR) 10 MG tablet Take 1 tablet (10 mg total) by mouth at bedtime. 30 tablet 11  . pantoprazole (PROTONIX) 40 MG tablet Take 1 tablet (40 mg total) by mouth daily. Take 30-60 min before first meal of the day 30 tablet 11  . predniSONE (DELTASONE) 10 MG tablet Take four tablets on day 1, three tablet on day 2, two tablet on day 3, one tablet on day 4, then stop. 10 tablet 0  . QUEtiapine (SEROQUEL) 100 MG tablet Take 150 mg by mouth at bedtime.    . risperiDONE (RISPERDAL) 1 MG tablet Take 3 mg by mouth at  bedtime.   3  . senna-docusate (SENOKOT-S) 8.6-50 MG tablet Take 1 tablet by mouth 2 (two) times daily.    Marland Kitchen Spacer/Aero-Holding Chambers (AEROCHAMBER MV) inhaler Use as instructed 1 each 0  . Tiotropium Bromide Monohydrate (SPIRIVA RESPIMAT) 2.5 MCG/ACT AERS Inhale 2 puffs into the lungs daily. (Patient not taking: Reported on 09/08/2019) 12 g 2  . Tiotropium Bromide Monohydrate (SPIRIVA RESPIMAT) 2.5 MCG/ACT AERS Inhale 2 puffs into the lungs daily. 1 each 0  . venlafaxine XR (EFFEXOR-XR) 150 MG 24 hr capsule Take 150 mg by mouth at bedtime.     . Vitamin D, Ergocalciferol, (DRISDOL) 50000 units CAPS capsule Take 50,000 Units by mouth every Saturday.   0    Drug Regimen Review Drug regimen was reviewed and remains  appropriate with no significant issues identified  Home: Home Living Family/patient expects to be discharged to:: Private residence Living Arrangements: Children Available Help at Discharge: Family, Available PRN/intermittently Type of Home: House Home Access: Stairs to enter Technical brewer of Steps: 4 Entrance Stairs-Rails: Right Home Layout: One level Bathroom Shower/Tub: Tub/shower unit, Industrial/product designer: Yes   Functional History:    Functional Status:  Mobility:          ADL:    Cognition: Cognition Overall Cognitive Status: Within Functional Limits for tasks assessed Arousal/Alertness: Awake/alert Orientation Level: Oriented X4 Memory: Appears intact Immediate Memory Recall: Sock, Blue, Bed Memory Recall Sock: Without Cue Memory Recall Blue: Without Cue Memory Recall Bed: Without Cue Safety/Judgment: Appears intact Cognition Overall Cognitive Status: Within Functional Limits for tasks assessed  Physical Exam: Blood pressure 113/62, pulse 80, temperature 98.8 F (37.1 C), resp. rate 17, height 5\' 3"  (1.6 m), weight 90.3 kg, SpO2 96 %. Physical Exam General: Alert and oriented x 3, sitting up in  bed- appropriate, wearing O2 2L O2 by [place], NAD HEENT: Head is normocephalic, atraumatic, PERRLA, EOMI, sclera anicteric, oral mucosa pink and moist, dentition intact, ext ear canals clear,  Neck: Supple without JVD or lymphadenopathy Heart: Tachycardic. No murmurs rubs or gallop- regular rhythms Chest: +wheezing- a few rhonchi and wheezing heard (not on O2 full time at home- just prn) Abdomen: Soft, non-tender, non-distended, bowel sounds positive. Extremities: No clubbing, cyanosis, or edema. Pulses are 2+ Skin: Left forearm edema Neuro: Patient is alert no acute distress oriented x3 and follows commands. TLSO off since in bed Psych: Pt's affect is appropriate. Pt is cooperative   Results for orders placed or performed during the hospital encounter of 09/12/19 (from the past 48 hour(s))  CBC     Status: Abnormal   Collection Time: 09/12/19  6:41 PM  Result Value Ref Range   WBC 12.4 (H) 4.0 - 10.5 K/uL   RBC 3.86 (L) 3.87 - 5.11 MIL/uL   Hemoglobin 12.6 12.0 - 15.0 g/dL   HCT 39.3 36 - 46 %   MCV 101.8 (H) 80.0 - 100.0 fL   MCH 32.6 26.0 - 34.0 pg   MCHC 32.1 30.0 - 36.0 g/dL   RDW 13.9 11.5 - 15.5 %   Platelets 275 150 - 400 K/uL   nRBC 0.0 0.0 - 0.2 %    Comment: Performed at [facility], [place] [address], [place], [place] [address]  Creatinine, serum     Status: None   Collection Time: 09/12/19  6:41 PM  Result Value Ref Range   Creatinine, Ser 0.73 0.44 - 1.00 mg/dL   GFR calc non Af Amer >60 >60 mL/min   GFR calc Af Amer >60 >60 mL/min    Comment: Performed at [facility] [address], [place], [place] [address]   VAS [place] UPPER EXTREMITY VENOUS DUPLEX  Result Date: 09/12/2019 UPPER VENOUS STUDY  Indications: Edema Comparison Study: No prior studies. Performing Technologist: Darlin Coco  Examination Guidelines: A complete evaluation includes B-mode imaging, spectral Doppler, color Doppler, and power Doppler as needed of all accessible portions of each vessel.  Bilateral testing is considered an integral part of a complete examination. Limited examinations for reoccurring indications may be performed as noted.  Right Findings: +----------+------------+---------+-----------+----------+-------+ RIGHT     CompressiblePhasicitySpontaneousPropertiesSummary +----------+------------+---------+-----------+----------+-------+ Subclavian    Full       Yes       Yes                      +----------+------------+---------+-----------+----------+-------+  Left Findings: +----------+------------+---------+-----------+----------+---------------------+ LEFT      CompressiblePhasicitySpontaneousProperties       Summary        +----------+------------+---------+-----------+----------+---------------------+ IJV           Full       Yes       Yes                                    +----------+------------+---------+-----------+----------+---------------------+ Subclavian    Full       Yes       Yes                                    +----------+------------+---------+-----------+----------+---------------------+ Axillary      Full       Yes       Yes                                    +----------+------------+---------+-----------+----------+---------------------+ Brachial      Full                                                        +----------+------------+---------+-----------+----------+---------------------+ Radial        Full                                                        +----------+------------+---------+-----------+----------+---------------------+ Ulnar         Full                                                        +----------+------------+---------+-----------+----------+---------------------+ Cephalic      Full                                                        +----------+------------+---------+-----------+----------+---------------------+ Basilic       Full                                     Branch of basilic                                                        non-compressible at  affected area.     +----------+------------+---------+-----------+----------+---------------------+  Summary:  Right: No evidence of thrombosis in the subclavian.  Left: No evidence of deep vein thrombosis in the upper extremity. Branch of basilic non-compressible at affected area.  *See table(s) above for measurements and observations.    Preliminary    Medical Problem List and Plan: 1.  Decreased functional ability secondary to recurrent COPD exacerbation with compression fracture T6.  Back brace when out of bed..  Continue nebulizer treatments as directed.  Prednisone tapered  -patient may shower  -ELOS/Goals: modI 8-9 days 2.  Antithrombotics: -DVT/anticoagulation: Lovenox  -antiplatelet therapy: N/A 3. Pain Management: Baclofen 10 mg 3 times daily, Lidoderm patch, hydrocodone as needed. Log roll to minimize back pain  Pt feels pain not as well controlled, but on same regimen was on at home- will monitor.  4. Mood: Lamictal 200 mg nightly, Effexor 794 mg daily Concerta 72 mg daily Xanax as needed  -antipsychotic agents: Seroquel 150 mg nightly, Risperdal 3 mg nightly 5. Neuropsych: This patient is capable of making decisions on her own behalf. 6. Skin/Wound Care: Routine skin checks 7. Fluids/Electrolytes/Nutrition: Routine in and outs with follow-up chemistries. 8. Acute on chronic respiratory failure with hypoxia: continue to monitor saturations closely with therapy. Wean oxygen as tolerated. - Pt had prn O2 at home, but didn't wear full time.   Courtney Heys, MD 09/13/2019   I have personally performed a face to face diagnostic evaluation, including, but not limited to relevant history and physical exam findings, of this patient and developed relevant assessment and plan.  Additionally, I have reviewed and concur with the  physician assistant's documentation above.

## 2019-09-13 NOTE — Evaluation (Signed)
Physical Therapy Assessment and Plan  Patient Details  Name: Julia Daniels MRN: [id number] Date of Birth: December 19, 1950  PT Diagnosis: Abnormal posture, Abnormality of gait, Difficulty walking, Edema, Muscle weakness and Pain in thoracic spine Rehab Potential: Good ELOS: 12-16 days   Today's Date: 09/13/2019 PT Individual Time: 2637-8588 PT Individual Time Calculation (min): 77 min    Hospital Problem: Active Problems:   COPD exacerbation [facility])   Past Medical History:  Past Medical History:  Diagnosis Date  . Anxiety   . Asthma   . COPD (chronic obstructive pulmonary disease) ([place])   . Depression    Past Surgical History:  Past Surgical History:  Procedure Laterality Date  . ABDOMINAL HYSTERECTOMY     partial     Assessment & Plan Clinical Impression: Patient is a 69 y.o. year old right-handed female with history of COPD who quit smoking 2 years ago and multiple admissions for COPD exacerbation, anxiety disorder.  Per chart review patient lives with her family.  1 level home 4 steps to entry.  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  Presented 09/08/2019 with increasing shortness of breath as well as wheezing.  CT angiogram of the chest no evidence of pulmonary emboli.  Marked severity coronary artery calcification.  Compression fracture deformity of indeterminate age involving the T6 vertebral body.  CT abdomen pelvis showed no acute intra-abdominal or intrapelvic process.  Prominent lower lumbar spondylosis greatest at L4-5 and L5-S1.  CT lumbar spine no acute bony abnormality.  Admission chemistries unremarkable, SARS current virus negative, troponin negative, urinalysis negative nitrite.  Interventional radiology consulted for possible kyphoplasty T6 and was not recommended at this time advised conservative care and fitted with a TLSO back brace.  Vascular study right upper extremity for some edema negative for DVT.  Maintained on Lovenox for DVT  prophylaxis.  Nebulizer treatments ongoing for her COPD.  Pain managed with Lidoderm patch, scheduled baclofen 10 mg 3 times daily as well as hydrocodone as needed.  Therapy evaluations completed and patient was admitted for comprehensive rehab program..  Patient transferred to CIR on 09/12/2019 .   Patient currently requires CGA with mobility secondary to muscle weakness, decreased cardiorespiratoy endurance and decreased oxygen support and decreased standing balance, decreased postural control and decreased balance strategies.  Prior to hospitalization, patient was independent  with mobility and lived with Son ([name]) in a House home.  Home access is 4Stairs to enter (enters through back door with 4 STE R HR and then 1 step-up from washer/dryer room to the den/bedrooms).  Patient will benefit from skilled PT intervention to maximize safe functional mobility, minimize fall risk and decrease caregiver burden for planned discharge home with intermittent assist.  Anticipate patient will benefit from follow up OP at discharge.  PT - End of Session Activity Tolerance: Tolerates 30+ min activity with multiple rests Endurance Deficit: Yes Endurance Deficit Description: requires frequent seated rest break due to SOB PT Assessment Rehab Potential (ACUTE/IP ONLY): Good PT Barriers to Discharge: Decreased caregiver support;Lack of/limited family support;Other (comments);Home environment access/layout PT Barriers to Discharge Comments: chronic respiratory impairments PT Patient demonstrates impairments in the following area(s): Balance;Edema;Endurance;Motor;Nutrition;Pain;Safety PT Transfers Functional Problem(s): Bed Mobility;Bed to Chair;Car;Furniture PT Locomotion Functional Problem(s): Ambulation;Stairs PT Plan PT Intensity: Minimum of 1-2 x/day ,45 to 90 minutes PT Frequency: 5 out of 7 days PT Duration Estimated Length of Stay: 12-16 days PT Treatment/Interventions: Ambulation/gait training;Community  reintegration;DME/adaptive equipment instruction;Neuromuscular re-education;Psychosocial support;UE/LE Strength taining/ROM;Stair training;Balance/vestibular training;Discharge planning;Functional electrical stimulation;Pain  management;Skin care/wound management;UE/LE Coordination activities;Therapeutic Activities;Cognitive remediation/compensation;Disease management/prevention;Functional mobility training;Patient/family education;Splinting/orthotics;Therapeutic Exercise;Visual/perceptual remediation/compensation PT Transfers Anticipated Outcome(s): mod-I PT Locomotion Anticipated Outcome(s): mod-I household PT Recommendation Follow Up Recommendations: Outpatient PT;Other (comment) (intermittent assist) Patient destination: Home Equipment Recommended: To be determined   PT Evaluation Precautions/Restrictions Precautions Precautions: Fall Precaution Comments: new T6 comp Fx and COPD (home O2 at night/PRN day) Required Braces or Orthoses: Spinal Brace Spinal Brace: Thoracolumbosacral orthotic;Applied in sitting position Restrictions Weight Bearing Restrictions: No Other Position/Activity Restrictions: Don/doff EOB for self teaching Pain Pain Assessment Pain Scale: 0-10 Pain Score: 5  Pain Type: Acute pain Pain Location: Back Pain Orientation: Right;Left;Mid Pain Descriptors / Indicators: Aching Pain Onset: On-going Pain Intervention(s): Medication (See eMAR);Rest;Relaxation;Emotional support;Repositioned;Other (Comment) (TLSO) Multiple Pain Sites: No Home Living/Prior Functioning Home Living Available Help at Discharge: Family;Available PRN/intermittently (son works 2nd shift and pt works 3rd shift so they don't see each other) Type of Home: House Home Access: Stairs to enter (enters through back door with 4 STE R HR and then 1 step-up from washer/dryer room to the den/bedrooms) Entrance Stairs-Number of Steps: 4 Entrance Stairs-Rails: Right Home Layout: One level  Lives With: Son  ([name]) Prior Function Level of Independence: Independent with basic ADLs;Independent with homemaking with ambulation  Able to Take Stairs?: Yes Driving: Yes Comments: 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 independent with mobility without AD just reports frequent standing rest breaks due to SOB Perception  Perception Perception: Within Functional Limits Praxis Praxis: Intact  Cognition Overall Cognitive Status: Within Functional Limits for tasks assessed Arousal/Alertness: Awake/alert Orientation Level: Oriented X4 Attention: Focused;Sustained Focused Attention: Appears intact Sustained Attention: Appears intact Memory: Appears intact Safety/Judgment: Appears intact Sensation Sensation Light Touch: Appears Intact Hot/Cold: Not tested Proprioception: Appears Intact Stereognosis: Not tested Coordination Gross Motor Movements are Fluid and Coordinated: Yes Motor  Motor Motor: Abnormal postural alignment and control   Trunk/Postural Assessment  Cervical Assessment Cervical Assessment: Exceptions to [facility] (forward head) Thoracic Assessment Thoracic Assessment: Exceptions to WFL (TLSO) Lumbar Assessment Lumbar Assessment: Exceptions to WFL (TLSO) Postural Control Postural Control: Deficits on evaluation Postural Limitations: decreased  Balance Balance Balance Assessed: Yes Static Sitting Balance Static Sitting - Balance Support: Feet supported Static Sitting - Level of Assistance: 5: Stand by assistance Dynamic Sitting Balance Dynamic Sitting - Balance Support: Feet supported Dynamic Sitting - Level of Assistance: 5: Stand by assistance Static Standing Balance Static Standing - Balance Support: During functional activity Static Standing - Level of Assistance: 5: Stand by assistance Dynamic Standing Balance Dynamic Standing - Balance Support: During functional activity Dynamic Standing - Level of Assistance: Other (comment);4: Min assist (CGA) Extremity Assessment       RLE Assessment RLE Assessment: Within Functional Limits Active Range of Motion (AROM) Comments: WFL General Strength Comments: Grossly 4+/5 assessed in supine LLE Assessment LLE Assessment: Within Functional Limits Active Range of Motion (AROM) Comments: WFL General Strength Comments: Grossly 4+/5 assessed in supine  Care Tool Care Tool Bed Mobility Roll left and right activity Roll left and right activity did not occur:  (unable to tolerate HOB flat to perform like home set-up) Roll left and right assist level: Supervision/Verbal cueing (using bed features)    Sit to lying activity Sit to lying activity did not occur:  (unable to tolerate HOB flat to perform like home set-up) Sit to lying assist level: Minimal Assistance - Patient > 75% (using bed features)    Lying to sitting edge of bed activity Lying to sitting edge of bed activity did  not occur:  (unable to tolerate HOB flat to perform like home set-up) Lying to sitting edge of bed assist level: Supervision/Verbal cueing (using bed features)     Care Tool Transfers Sit to stand transfer   Sit to stand assist level: Contact Guard/Touching assist    Chair/bed transfer   Chair/bed transfer assist level: Contact Guard/Touching assist     Psychologist, counselling transfer activity did not occur: Safety/medical concerns (unable to perform due to pain)        Care Tool Locomotion Ambulation   Assist level: Contact Guard/Touching assist Assistive device: No Device Max distance: 4ft  Walk 10 feet activity   Assist level: Contact Guard/Touching assist Assistive device: No Device   Walk 50 feet with 2 turns activity Walk 50 feet with 2 turns activity did not occur: Safety/medical concerns      Walk 150 feet activity Walk 150 feet activity did not occur: Safety/medical concerns      Walk 10 feet on uneven surfaces activity Walk 10 feet on uneven surfaces activity did not occur: Safety/medical  concerns      Stairs   Assist level: Contact Guard/Touching assist Stairs assistive device: 2 hand rails Max number of stairs: 4  Walk up/down 1 step activity   Walk up/down 1 step (curb) assist level: Contact Guard/Touching assist Walk up/down 1 step or curb assistive device: 2 hand rails    Walk up/down 4 steps activity Walk up/down 4 steps assist level: Contact Guard/Touching assist Walk up/down 4 steps assistive device: 2 hand rails  Walk up/down 12 steps activity Walk up/down 12 steps activity did not occur: Safety/medical concerns      Pick up small objects from floor Pick up small object from the floor (from standing position) activity did not occur: Safety/medical concerns (spine precautions)      Wheelchair Will patient use wheelchair at discharge?: No          Wheel 50 feet with 2 turns activity      Wheel 150 feet activity        Refer to Care Plan for Long Term Goals  SHORT TERM GOAL WEEK 1 PT Short Term Goal 1 (Week 1): Pt will perform supine<>sit with min assist, not using bed features PT Short Term Goal 2 (Week 1): Pt will perform stand pivot transfers with supervision PT Short Term Goal 3 (Week 1): Pt will ambulate at least 32ft using LRAD with supervision PT Short Term Goal 4 (Week 1): Pt will ascend/descend 4 steps using R HR only with CGA  Recommendations for other services: None   Skilled Therapeutic Intervention Mobility Bed Mobility Bed Mobility: Supine to Sit;Sit to Supine Supine to Sit: Supervision/Verbal cueing (requires use of bed features) Sit to Supine: Supervision/Verbal cueing (requires use of bed features) Transfers Transfers: Sit to Stand;Stand to Sit;Stand Pivot Transfers Sit to Stand: Contact Guard/Touching assist Stand to Sit: Contact Guard/Touching assist Stand Pivot Transfers: Contact Guard/Touching assist Transfer (Assistive device): None Locomotion  Gait Ambulation: Yes Gait Assistance: Contact Guard/Touching assist Gait  Distance (Feet): 30 Feet Assistive device: None Gait Gait: Yes Gait Pattern: Impaired Gait Pattern: Decreased step length - right;Decreased step length - left;Poor foot clearance - left;Poor foot clearance - right;Wide base of support (slight lateral trunk lean during stance bilaterally) Gait velocity: decreased Stairs / Additional Locomotion Stairs: Yes Stairs Assistance: Contact Guard/Touching assist Stair Management Technique: Two rails Number of Stairs: 4 Height of Stairs: 6 Ramp:  Other (comment) (did not get to perform due to increased pain) Curb: Other (comment) (did not get to perform due to increased pain) Wheelchair Mobility Wheelchair Mobility: No  Evaluation completed (see details above) with education on PT POC and goals and individual treatment initiated with focus on bed mobility, transfers, gait, stairs, and activity tolerance. Patient performed the above mobility tasks with the assistance levels described with therapist providing cuing throughout for sequencing/technique as needed for increased independent and to maintain precautions. Pt received and maintained on 2L of O2 via nasal cannula throughout session - HR elevated up to 120bpm with stair navigation and SpO2 maintained >93% throughout. Pt's endurance is significantly limited due to becoming SOB requiring seated rest breaks to focus on pursed lip breathing. Therapist educated pt on spine precautions, logroll technique for bed mobility, and need to wear TLSO when OOB. Therapist educated pt on how to don TLSO while sitting EOB. Pt unable to ambulate >70f due to becoming SOB - demos wide based gait with decreased B LE step length and slight lateral trunk flexion during stance bilaterally (likely weak hip abductors). Navigated stairs with step-to pattern leading with R LE on ascent and L LE on descent. After this patient reports significant increase in pain and requests to return to room. Ambulated to/from bathroom for toileting  and continent of bladder. At end of session pt left supine in bed with needs in reach, lines intact, and bed alarm on.  Discharge Criteria: Patient will be discharged from PT if patient refuses treatment 3 consecutive times without medical reason, if treatment goals not met, if there is a change in medical status, if patient makes no progress towards goals or if patient is discharged from hospital.  The above assessment, treatment plan, treatment alternatives and goals were discussed and mutually agreed upon: by patient  CTawana Scale, PT, DPT, CSRS  09/13/2019, 12:38 PM

## 2019-09-14 MED ORDER — HYDROCODONE-ACETAMINOPHEN 10-325 MG PO TABS
1.0000 | ORAL_TABLET | ORAL | Status: DC | PRN
  Administered 2019-09-14 – 2019-09-23 (×37): 1 via ORAL
  Filled 2019-09-14 (×37): qty 1

## 2019-09-14 NOTE — Progress Notes (Signed)
[facility] PHYSICAL MEDICINE & REHABILITATION PROGRESS NOTE   Subjective/Complaints:  Pt reports wasn't able to finish therapy yesterday/evals due to severe pain- was wondering, since doing more, if we can increase pain meds-  Usually took Hydrocodone 5mg  QID at home, but has a new compression fx.   LBM last night- going daily.   Still on 2L O2 by [place] Pt also c/o 1 episode yesterday was very SOB- fine since then.   ROS:  Pt denies abd pain, CP, N/V/C/D, and vision changes   Objective:   No results found. Recent Labs    09/12/19 0448 09/12/19 1841  WBC 12.2* 12.4*  HGB 12.2 12.6  HCT 38.7 39.3  PLT 243 275   Recent Labs    09/12/19 0448 09/12/19 1841  NA 138  --   K 3.8  --   CL 100  --   CO2 29  --   GLUCOSE 103*  --   BUN 15  --   CREATININE 0.77 0.73  CALCIUM 9.4  --     Intake/Output Summary (Last 24 hours) at 09/14/2019 1233 Last data filed at 09/14/2019 0700 Gross per 24 hour  Intake 600 ml  Output --  Net 600 ml     Physical Exam: Vital Signs Blood pressure (!) 159/87, pulse 88, temperature 98 F (36.7 C), resp. rate 16, height 5\' 3"  (1.6 m), weight 90.3 kg, SpO2 99 %.   Physical Exam General: appropriate, laying in bed- supine- wearing O2, by [place]- appears comfortable/resp wise, NAD HEENT: O2 by [place] 2L- conjugate gaze Heart: RRR Chest:  sounds better today- decreased at bases- no W/R/R-  Abdomen: Soft, NT, ND, (+)BS  Extremities: No clubbing, cyanosis, or edema. Pulses are 2+ Skin: Left forearm edema Neuro: Patient is alert no acute distress oriented x3 and follows commands. TLSO off since in bed Psych: appropriate    Assessment/Plan: 1. Functional deficits secondary to Debility due to COPD exacerbation, prolonged hospitalization  which require 3+ hours per day of interdisciplinary therapy in a comprehensive inpatient rehab setting.  Physiatrist is providing close team supervision and 24 hour management of active medical problems listed  below.  Physiatrist and rehab team continue to assess barriers to discharge/monitor patient progress toward functional and medical goals  Care Tool:  Bathing    Body parts bathed by patient: Chest, Abdomen, Front perineal area, Face   Body parts bathed by helper: Right arm, Left arm, Buttocks, Right upper leg, Left upper leg, Right lower leg, Left lower leg     Bathing assist Assist Level: Maximal Assistance - Patient 24 - 49%     Upper Body Dressing/Undressing Upper body dressing   What is the patient wearing?: Pull over shirt, Orthosis Orthosis activity level: Performed by helper  Upper body assist Assist Level: Maximal Assistance - Patient 25 - 49%    Lower Body Dressing/Undressing Lower body dressing      What is the patient wearing?: Pants     Lower body assist Assist for lower body dressing: Total Assistance - Patient < 25%     Toileting Toileting    Toileting assist Assist for toileting: Minimal Assistance - Patient > 75% Assistive Device Comment:  (BSC)   Transfers Chair/bed transfer  Transfers assist     Chair/bed transfer assist level: Contact Guard/Touching assist     Locomotion Ambulation   Ambulation assist      Assist level: Contact Guard/Touching assist Assistive device: No Device Max distance: 19ft   Walk 10 feet  activity   Assist     Assist level: Contact Guard/Touching assist Assistive device: No Device   Walk 50 feet activity   Assist Walk 50 feet with 2 turns activity did not occur: Safety/medical concerns         Walk 150 feet activity   Assist Walk 150 feet activity did not occur: Safety/medical concerns         Walk 10 feet on uneven surface  activity   Assist Walk 10 feet on uneven surfaces activity did not occur: Safety/medical concerns         Wheelchair     Assist Will patient use wheelchair at discharge?: No             Wheelchair 50 feet with 2 turns activity    Assist             Wheelchair 150 feet activity     Assist          Blood pressure (!) 159/87, pulse 88, temperature 98 F (36.7 C), resp. rate 16, height 5\' 3"  (1.6 m), weight 90.3 kg, SpO2 99 %.  Medical Problem List and Plan: 1.  Decreased functional ability secondary to recurrent COPD exacerbation with compression fracture T6.  Back brace when out of bed..  Continue nebulizer treatments as directed.  Prednisone tapered             -patient may shower             -ELOS/Goals: modI 8-9 days 2.  Antithrombotics: -DVT/anticoagulation: Lovenox             -antiplatelet therapy: N/A 3. Pain Management: Baclofen 10 mg 3 times daily, Lidoderm patch, hydrocodone as needed. Log roll to minimize back pain  9/5- will increase Norco to 5-10 mg q4 hours prn due to compression fx will hold off on Oxycodone- hopefully, due to sedation risks, won't have to go to Oxy. .  4. Mood: Lamictal 200 mg nightly, Effexor 950 mg daily Concerta 72 mg daily Xanax as needed             -antipsychotic agents: Seroquel 150 mg nightly, Risperdal 3 mg nightly 5. Neuropsych: This patient is capable of making decisions on her own behalf. 6. Skin/Wound Care: Routine skin checks 7. Fluids/Electrolytes/Nutrition: Routine in and outs with follow-up chemistries. 8. Acute on chronic respiratory failure with hypoxia: continue to monitor saturations closely with therapy. Wean oxygen as tolerated. - Pt had prn O2 at home, but didn't wear full time.  9/5- still t 2L- weaning as tolerated  9. Leukocytosis  9/5- likely due to steroids- afebrile- no complaints- labs tomorrow    LOS: 2 days A FACE TO FACE EVALUATION WAS PERFORMED  Morty Ortwein 09/14/2019, 12:33 PM

## 2019-09-15 ENCOUNTER — Inpatient Hospital Stay (HOSPITAL_COMMUNITY): Payer: BC Managed Care – PPO | Admitting: Physical Therapy

## 2019-09-15 ENCOUNTER — Inpatient Hospital Stay (HOSPITAL_COMMUNITY): Payer: BC Managed Care – PPO | Admitting: Occupational Therapy

## 2019-09-15 LAB — COMPREHENSIVE METABOLIC PANEL
ALT: 23 U/L (ref 0–44)
AST: 18 U/L (ref 15–41)
Albumin: 3 g/dL — ABNORMAL LOW (ref 3.5–5.0)
Alkaline Phosphatase: 52 U/L (ref 38–126)
Anion gap: 9 (ref 5–15)
BUN: 10 mg/dL (ref 8–23)
CO2: 28 mmol/L (ref 22–32)
Calcium: 9.5 mg/dL (ref 8.9–10.3)
Chloride: 100 mmol/L (ref 98–111)
Creatinine, Ser: 0.93 mg/dL (ref 0.44–1.00)
GFR calc Af Amer: >60 mL/min (ref >60)
GFR calc non Af Amer: >60 mL/min (ref >60)
Glucose, Bld: 117 mg/dL — ABNORMAL HIGH (ref 70–99)
Potassium: 4.3 mmol/L (ref 3.5–5.1)
Sodium: 137 mmol/L (ref 135–145)
Total Bilirubin: 0.7 mg/dL (ref 0.3–1.2)
Total Protein: 5.4 g/dL — ABNORMAL LOW (ref 6.5–8.1)

## 2019-09-15 LAB — CBC WITH DIFFERENTIAL/PLATELET
Abs Immature Granulocytes: 0.11 10*3/uL — ABNORMAL HIGH (ref 0.00–0.07)
Basophils Absolute: 0 10*3/uL (ref 0.0–0.1)
Basophils Relative: 0 %
Eosinophils Absolute: 0 10*3/uL (ref 0.0–0.5)
Eosinophils Relative: 0 %
HCT: 39.4 % (ref 36.0–46.0)
Hemoglobin: 12.2 g/dL (ref 12.0–15.0)
Immature Granulocytes: 1 %
Lymphocytes Relative: 24 %
Lymphs Abs: 3 10*3/uL (ref 0.7–4.0)
MCH: 31.6 pg (ref 26.0–34.0)
MCHC: 31 g/dL (ref 30.0–36.0)
MCV: 102.1 fL — ABNORMAL HIGH (ref 80.0–100.0)
Monocytes Absolute: 0.8 10*3/uL (ref 0.1–1.0)
Monocytes Relative: 7 %
Neutro Abs: 8.2 10*3/uL — ABNORMAL HIGH (ref 1.7–7.7)
Neutrophils Relative %: 68 %
Platelets: 250 10*3/uL (ref 150–400)
RBC: 3.86 MIL/uL — ABNORMAL LOW (ref 3.87–5.11)
RDW: 14 % (ref 11.5–15.5)
WBC: 12.1 10*3/uL — ABNORMAL HIGH (ref 4.0–10.5)
nRBC: 0 % (ref 0.0–0.2)

## 2019-09-15 MED ORDER — IBUPROFEN 200 MG PO TABS
800.0000 mg | ORAL_TABLET | Freq: Two times a day (BID) | ORAL | Status: DC | PRN
  Administered 2019-09-15 – 2019-09-26 (×12): 800 mg via ORAL
  Filled 2019-09-15 (×12): qty 4

## 2019-09-15 MED ORDER — IPRATROPIUM-ALBUTEROL 0.5-2.5 (3) MG/3ML IN SOLN
3.0000 mL | Freq: Two times a day (BID) | RESPIRATORY_TRACT | Status: DC
  Administered 2019-09-15 – 2019-09-18 (×6): 3 mL via RESPIRATORY_TRACT
  Filled 2019-09-15 (×7): qty 3

## 2019-09-15 NOTE — Progress Notes (Signed)
Patient Details  Name: Julia Daniels MRN: [id number] Date of Birth: 10-23-1950  Today's Date: 09/15/2019  Hospital Problems: Principal Problem:   Debility Active Problems:   COPD exacerbation [facility])  Past Medical History:  Past Medical History:  Diagnosis Date  . Anxiety   . Asthma   . COPD (chronic obstructive pulmonary disease) ([place])   . Depression    Past Surgical History:  Past Surgical History:  Procedure Laterality Date  . ABDOMINAL HYSTERECTOMY     partial    Social History:  reports that she quit smoking about 2 years ago. Her smoking use included cigarettes. She has a 40.00 pack-year smoking history. She has never used smokeless tobacco. She reports that she does not drink alcohol and does not use drugs.  Family / Support Systems Marital Status: Divorced How Long?: 26 years Patient Roles: Parent Spouse/Significant Other: Divorced Children: Adult son Julia Daniels Other Supports: N/A Anticipated Caregiver: Julia Daniels (son) Ability/Limitations of Caregiver: Pt son works 2nd/3rd shift and sleeps in the morning. Caregiver Availability: Intermittent Family Dynamics: Pt lives with son. Both continue to work- 3rd shift.  Social History Preferred language: English Religion: Non-Denominational Cultural Background: Pt has been working in histology for 42 years. Pt has been at her most recent job for 18 years. Education: high school Read: Yes Write: Yes Employment Status: Employed Length of Employment: 18 Return to Work Plans: Pt intends to return to work when able. Pt has FMLA paperwork she would like completed for intermittent use only. Pt is eligible for STD/LTD through employer as well. Pt has just started this process. Legal History/Current Legal Issues: Denies Guardian/Conservator: N/A   Abuse/Neglect Abuse/Neglect Assessment Can Be Completed: Yes Physical Abuse: Denies Verbal Abuse: Denies Sexual Abuse: Denies Exploitation of patient/patient's resources:  Denies Self-Neglect: Denies  Emotional Status Pt's affect, behavior and adjustment status: Pt in good spirits at time of visit. Pt anxious about length of time here in CIR as she would like to return to work. Recent Psychosocial Issues: anxiety Psychiatric History: Pt has hx of depression, anxiety, ADD, and bipolar disorder. Pt sees Dr. Marvia Pickles for [facility] management/counseling. Pt currently does phone visits since [place]. Substance Abuse History: Denies; rarely etoh use. Quit smoking cigarettes 2 years ago.  Patient / Family Perceptions, Expectations & Goals Pt/Family understanding of illness & functional limitations: Pt and son have a general understanding of care needs Premorbid pt/family roles/activities: Independent Anticipated changes in roles/activities/participation: Assistance with ADLs/IADLs  Community Resources [employer] Scripts: None Premorbid Home Care/DME Agencies: None Transportation available at discharge: son to transport Resource referrals recommended: Neuropsychology  Discharge Planning Living Arrangements: Children Support Systems: Children Type of Residence: Private residence Insurance Resources: Multimedia programmer (specify) Nurse, mental health) Financial Resources: Employment, Family Support Financial Screen Referred: No Living Expenses: Own Money Management: Patient Does the patient have any problems obtaining your medications?: No Home Management: Son manages housekeeping. Reports that they order out for meals. Care Coordinator Barriers to Discharge: Decreased caregiver support, Lack of/limited family support, New oxygen Care Coordinator Barriers to Discharge Comments: Pt is waiting on updates if employer will allow her to work since on 02. Pt was home o2 PRN only PTA. Care Coordinator Anticipated Follow Up Needs: HH/OP Expected length of stay: 12-16 days  Clinical Impression SW met with pt in room to introduce self, explain role, and discuss discharge process.  Pt is not a English as a second language teacher. No HCPOA. Pt DME: home o2 [place], nebulizer.   *SW received FMLA forms.   Julia Daniels A Julia Daniels 09/15/2019, 11:26  AM

## 2019-09-15 NOTE — Progress Notes (Signed)
Occupational Therapy Session Note  Patient Details  Name: Julia Daniels MRN: [id number] Date of Birth: 04/05/1950  Today's Date: 09/15/2019 OT Individual Time: 1115-1145 OT Individual Time Calculation (min): 30 min    Short Term Goals: Week 1:  OT Short Term Goal 1 (Week 1): Pt will don shirt with S OT Short Term Goal 2 (Week 1): Pt will don orthosis wiht MIN A OT Short Term Goal 3 (Week 1): Pt will stand pivot to BSC/toilet wiht S OT Short Term Goal 4 (Week 1): Pt will thread BLE into pants wiht S and AE PRN OT Short Term Goal 5 (Week 1): Pt will perform funcitonal task for 2 min prior to needing rest break to demo improved activity tolerance  Skilled Therapeutic Interventions/Progress Updates:    Pt received in bed agreeable to therapy. Pt did need to toilet. Sat to EOB with min A and helped pt to don TLSO.  CGA to stand to RW and ambulate to toilet. Pt able to push pants down, cleanse and then pull pants over hips.  With min A.   She wanted her regular underwear on vs a brief and needed total A to don underwear and pants over feet due to back precautions. Pt will need a reacher.   Ambulated back to EOB and sat for a few minutes, but pt in too much pain and assisted back to bed with min A to adjust positioning in bed.   Pt set up in bed with all needs met.  Bed alarm set.   Therapy Documentation Precautions:  Precautions Precautions: Fall Precaution Comments: new T6 comp Fx and COPD (home O2 at night/PRN day) Required Braces or Orthoses: Spinal Brace Spinal Brace: Thoracolumbosacral orthotic, Applied in sitting position Restrictions Weight Bearing Restrictions: No Other Position/Activity Restrictions: Don/doff EOB for self teaching  Vital Signs: Oxygen Therapy SpO2: 97 % O2 Device: Nasal Cannula O2 Flow Rate (L/min): 2.5 L/min Pain: Pain Assessment Pain Scale: 0-10 Pain Score: 9  Pain Type: Chronic pain Pain Location: Back Pain Orientation: Left;Right;Mid Pain  Descriptors / Indicators: Aching Pain Frequency: Constant Pain Onset: On-going Patients Stated Pain Goal: 2 Pain Intervention(s): Repositioned ADL: ADL Eating: Modified independent Grooming: Setup Where Assessed-Grooming: Sitting at sink Upper Body Bathing: Minimal assistance Where Assessed-Upper Body Bathing: Wheelchair, Sitting at sink Lower Body Bathing: Dependent Where Assessed-Lower Body Bathing: Wheelchair Upper Body Dressing: Maximal assistance Where Assessed-Upper Body Dressing: Sitting at sink, Wheelchair Lower Body Dressing: Dependent Where Assessed-Lower Body Dressing: Sitting at sink, Wheelchair   Therapy/Group: Individual Therapy  [place] 09/15/2019, 10:19 AM

## 2019-09-15 NOTE — Progress Notes (Signed)
Physical Therapy Session Note  Patient Details  Name: Julia Daniels MRN: [id number] Date of Birth: 1950-12-25  Today's Date: 09/15/2019 PT Individual Time: 3112-1624 PT Individual Time Calculation (min): 27 min   Short Term Goals: Week 1:  PT Short Term Goal 1 (Week 1): Pt will perform supine<>sit with min assist, not using bed features PT Short Term Goal 2 (Week 1): Pt will perform stand pivot transfers with supervision PT Short Term Goal 3 (Week 1): Pt will ambulate at least 66ft using LRAD with supervision PT Short Term Goal 4 (Week 1): Pt will ascend/descend 4 steps using R HR only with CGA  Skilled Therapeutic Interventions/Progress Updates: Pt presents in bed but agreeable to therapy.  Pt able to perform log roll to right w/ CGA and then to sitting w/ min A and use of siderail and elevated HOB.  Pt sat EOB and donned TLSO w/ min A.  Pt states pain of 4/10 at initiation of rx.  Pt transfers sit to stand w/ minA and verbal cues for hand placement, especially w/ stand to sit.  Pt received meds from RN sitting EOB.  Pt performed multiple gait trials up to 45' in and out of room, including turns to return to seat.  Pt states pain increased to 6/10 w/ activity, especially w/ sitting.  Pt amb back to bed and assist to doff TLSO.  Pt performed sit to right sidelying w/ min A as well as for scoot to [place].  Bed alarm on and all needs in reach.     Therapy Documentation Precautions:  Precautions Precautions: Fall Precaution Comments: new T6 comp Fx and COPD (home O2 at night/PRN day) Required Braces or Orthoses: Spinal Brace Spinal Brace: Thoracolumbosacral orthotic, Applied in sitting position Restrictions Weight Bearing Restrictions: No Other Position/Activity Restrictions: Don/doff EOB for self teaching General: PT Amount of Missed Time (min): 24 Minutes PT Missed Treatment Reason: Pain Vital Signs: Oxygen Therapy SpO2: 97 % O2 Device: Nasal Cannula O2 Flow Rate (L/min): 2.5  L/min Pain: 4/10 before tx, 6/10 LB w/ activity, meds given before and then during tx. Pain Assessment Pain Scale: 0-10 Pain Score: 9  Pain Type: Chronic pain Pain Location: Back Pain Orientation: Left;Right;Mid Pain Descriptors / Indicators: Aching Pain Frequency: Constant Pain Onset: On-going Patients Stated Pain Goal: 2 Pain Intervention(s): Repositioned Mobility:      Therapy/Group: Individual Therapy  Ladoris Gene 09/15/2019, 10:19 AM

## 2019-09-15 NOTE — Progress Notes (Signed)
Physical Therapy Session Note  Patient Details  Name: Julia Daniels MRN: [id number] Date of Birth: [birth year]  Today's Date: 09/15/2019 PT Individual Time:0800-0852 with 24 mins missed time and 1400-1529 with an extra 59mins of time PT Individual Time Calculation (min): 51 mins and 89 min   Short Term Goals: Week 1:  PT Short Term Goal 1 (Week 1): Pt will perform supine<>sit with min assist, not using bed features PT Short Term Goal 2 (Week 1): Pt will perform stand pivot transfers with supervision PT Short Term Goal 3 (Week 1): Pt will ambulate at least 104ft using LRAD with supervision PT Short Term Goal 4 (Week 1): Pt will ascend/descend 4 steps using R HR only with CGA   Skilled Therapeutic Interventions/Progress Updates:    session 1: pt received in bed and agreeable to therapy. Pt directed in supine>sit CGA with HOB slightly elevated, donned TLSO brace at EOB mod A, sat EOB for a 2-65mins 2/2 pain and shortness of breath. Pt directed in STS from EOB CGA to RW and directed in gait training with RW for 10' in room to sink, then transferred into [facility] supervision. Pt attempted to stand at sink for teeth brushing however ultimately unable 2/2 pain and required to sit to complete task and wash face at setup. Pt taken to hallway to attempt gait training with pt reporting she was very motivated to walk and do more this morning. Pt directed in STS to RW at [facility] however unable to take steps as x3 marching in place increased pain and pt unable to attempt gait training. Pt returned to room in [facility] at bedside, directed in STS CGA and x4 lateral steps with hand held assist to EOB min A, pt required to sit at foot of bed for several mins limited drastically by pain, pt lead in breathing tech while in sitting to improve tolerance to activity and attempt to decrease pain. Pt then directed in STS and additional x4 lateral steps to higher in bed for improved positioning min A. Pt then directed in min A sit>supine  for BLE management. Pt required extra time for repositioning 2/2 pain and needed multiple rest breaks. Pt on 2.5L O2 throughout session via [place]. Pt requested to be left in bed at end of session and unable to continue therapy due to pain in back 8/10. Nursing made aware. Pt very motivated to participate and upset she was unable to "do more". Pt left in bed, alarm set, All needs in reach and in good condition. Call light in hand.    Session 2: pt received in bed and agreeable to therapy. Pt directed in supine>sit CGA with HOB slightly elevated donned TLSO brace at EOB mod A; transferred to [facility] with step transfer with RW at [facility]. Nursing made aware, pt leaving unit and agreeable. Pt lead in WC total A for time management down elevator to main floor for increased open environment gait training with RW for 75' x2 two standing rest breaks during each rep. Pt directed in additional 50' then sitting rest break all at [facility]. Pt directed in stair training with one R hand rail x3 ascending and descending at [place] with VC for technique and safety. Pt returned to unit and directed in standing balance activity with underhanded bean bag toss 2x10 CGA with VC to limit BUE support to increase challenge for improve stability. Pt returned to room in [facility] total A for time management, directed in upper body bath for setup for needs and then  max A for back washing with pt in [facility] as pt requested to participate in bathing at end of session. Pt max A for donning and doffing shirt in sitting. Pt directed in gait in room with RW to rest room with pt able to (+) void bladder on toilet and supervision for hygiene.pt directed in gait with RW to return to bed CGA, sat EOB CGA, sit>supine with log roll at min A for BLE management onto bed. Pt left in bed, alarm set, All needs in reach and in good condition. Call light in hand.     Therapy Documentation Precautions:  Precautions Precautions: Fall Precaution Comments: new T6 comp Fx and COPD (home O2  at night/PRN day) Required Braces or Orthoses: Spinal Brace Spinal Brace: Thoracolumbosacral orthotic, Applied in sitting position Restrictions Weight Bearing Restrictions: No Other Position/Activity Restrictions: Don/doff EOB for self teaching    Therapy/Group: Individual Therapy  [name] 09/15/2019, 3:34 PM

## 2019-09-15 NOTE — Progress Notes (Signed)
[facility] PHYSICAL MEDICINE & REHABILITATION PROGRESS NOTE   Subjective/Complaints: Discussed her pain regimen. She feels she needs something for breakthrough pain.  Hopes to have BM today WBC trending down   ROS:  Pt denies abd pain, CP, N/V/C/D, and vision changes   Objective:   No results found. Recent Labs    09/12/19 1841 09/15/19 0730  WBC 12.4* 12.1*  HGB 12.6 12.2  HCT 39.3 39.4  PLT 275 250   Recent Labs    09/12/19 1841 09/15/19 0730  NA  --  137  K  --  4.3  CL  --  100  CO2  --  28  GLUCOSE  --  117*  BUN  --  10  CREATININE 0.73 0.93  CALCIUM  --  9.5    Intake/Output Summary (Last 24 hours) at 09/15/2019 4818 Last data filed at 09/15/2019 0756 Gross per 24 hour  Intake 884 ml  Output --  Net 884 ml     Physical Exam: Vital Signs Blood pressure (!) 168/87, pulse 88, temperature 97.9 F (36.6 C), temperature source Axillary, resp. rate 18, height 5\' 3"  (1.6 m), weight 90.3 kg, SpO2 97 %. Physical Exam General: Alert and oriented x 3, No apparent distress HEENT: Head is normocephalic, atraumatic, PERRLA, EOMI, sclera anicteric, oral mucosa pink and moist, dentition intact, ext ear canals clear,  Neck: Supple without JVD or lymphadenopathy Heart: Reg rate and rhythm. No murmurs rubs or gallops Chest:  sounds better today- decreased at bases- no W/R/R-  Abdomen: Soft, NT, ND, (+)BS  Extremities: No clubbing, cyanosis, or edema. Pulses are 2+ Skin: Left forearm edema Neuro: Patient is alert no acute distress oriented x3 and follows commands. TLSO off since in bed Psych: appropriate     Assessment/Plan: 1. Functional deficits secondary to Debility due to COPD exacerbation, prolonged hospitalization  which require 3+ hours per day of interdisciplinary therapy in a comprehensive inpatient rehab setting.  Physiatrist is providing close team supervision and 24 hour management of active medical problems listed below.  Physiatrist and rehab team  continue to assess barriers to discharge/monitor patient progress toward functional and medical goals  Care Tool:  Bathing    Body parts bathed by patient: Chest, Abdomen, Front perineal area, Face   Body parts bathed by helper: Right arm, Left arm, Buttocks, Right upper leg, Left upper leg, Right lower leg, Left lower leg     Bathing assist Assist Level: Maximal Assistance - Patient 24 - 49%     Upper Body Dressing/Undressing Upper body dressing   What is the patient wearing?: Pull over shirt, Orthosis Orthosis activity level: Performed by helper  Upper body assist Assist Level: Maximal Assistance - Patient 25 - 49%    Lower Body Dressing/Undressing Lower body dressing      What is the patient wearing?: Pants     Lower body assist Assist for lower body dressing: Total Assistance - Patient < 25%     Toileting Toileting    Toileting assist Assist for toileting: Minimal Assistance - Patient > 75% Assistive Device Comment:  (BSC)   Transfers Chair/bed transfer  Transfers assist     Chair/bed transfer assist level: Contact Guard/Touching assist     Locomotion Ambulation   Ambulation assist      Assist level: Contact Guard/Touching assist Assistive device: No Device Max distance: 76ft   Walk 10 feet activity   Assist     Assist level: Contact Guard/Touching assist Assistive device: No Device  Walk 50 feet activity   Assist Walk 50 feet with 2 turns activity did not occur: Safety/medical concerns         Walk 150 feet activity   Assist Walk 150 feet activity did not occur: Safety/medical concerns         Walk 10 feet on uneven surface  activity   Assist Walk 10 feet on uneven surfaces activity did not occur: Safety/medical concerns         Wheelchair     Assist Will patient use wheelchair at discharge?: No             Wheelchair 50 feet with 2 turns activity    Assist            Wheelchair 150 feet  activity     Assist          Blood pressure (!) 168/87, pulse 88, temperature 97.9 F (36.6 C), temperature source Axillary, resp. rate 18, height 5\' 3"  (1.6 m), weight 90.3 kg, SpO2 97 %.  Medical Problem List and Plan: 1.  Decreased functional ability secondary to recurrent COPD exacerbation with compression fracture T6.  Back brace when out of bed..  Continue nebulizer treatments as directed.  Prednisone tapered             -patient may shower             -ELOS/Goals: modI 8-9 days  -Continue CIR 2.  Antithrombotics: -DVT/anticoagulation: Lovenox             -antiplatelet therapy: N/A 3. Pain Management: Baclofen 10 mg 3 times daily, Lidoderm patch, hydrocodone as needed. Log roll to minimize back pain  9/5- will increase Norco to 5-10 mg q4 hours prn due to compression fx will hold off on Oxycodone- hopefully, due to sedation risks, won't have to go to Oxy.   9/6: Added ibuprofen 800mg  BID prn for breakthrough pain. Hgb anmd Cr normal.  4. Mood: Lamictal 200 mg nightly, Effexor 856 mg daily Concerta 72 mg daily Xanax as needed. Has some anxiety.              -antipsychotic agents: Seroquel 150 mg nightly, Risperdal 3 mg nightly 5. Neuropsych: This patient is capable of making decisions on her own behalf. 6. Skin/Wound Care: Routine skin checks 7. Fluids/Electrolytes/Nutrition: Routine in and outs with follow-up chemistries. 8. Acute on chronic respiratory failure with hypoxia: continue to monitor saturations closely with therapy. Wean oxygen as tolerated. - Pt had prn O2 at home, but didn't wear full time.  9/5- still t 2L- weaning as tolerated  9. Leukocytosis  9/5- likely due to steroids- afebrile- no complaints- trending downward 9/6    LOS: 3 days A FACE TO FACE EVALUATION WAS PERFORMED  Martha Clan P Ifeoluwa Bartz 09/15/2019, 9:23 AM

## 2019-09-15 NOTE — Discharge Instructions (Signed)
Inpatient Rehab Discharge Instructions  Julia Daniels Discharge date and time: No discharge date for patient encounter.   Activities/Precautions/ Functional Status: Activity: As tolerated Diet: Regular Wound Care: Routine skin checks Functional status:  ___ No restrictions     ___ Walk up steps independently ___ 24/7 supervision/assistance   ___ Walk up steps with assistance ___ Intermittent supervision/assistance  ___ Bathe/dress independently ___ Walk with walker     _x__ Bathe/dress with assistance ___ Walk Independently    ___ Shower independently ___ Walk with assistance    ___ Shower with assistance ___ No alcohol     ___ Return to work/school ________  Special Instructions: No driving smoking or alcohol   My questions have been answered and I understand these instructions. I will adhere to these goals and the provided educational materials after my discharge from the hospital.  Patient/Caregiver Signature _______________________________ Date __________  Clinician Signature _______________________________________ Date __________  Please bring this form and your medication list with you to all your follow-up doctor's appointments.

## 2019-09-15 NOTE — Care Management (Signed)
[facility] Individual Statement of Services  Patient Name:  ROSELIN WIEMANN  Date:  09/15/2019  Welcome to the [facility].  Our goal is to provide you with an individualized program based on your diagnosis and situation, designed to meet your specific needs.  With this comprehensive rehabilitation program, you will be expected to participate in at least 3 hours of rehabilitation therapies Monday-Friday, with modified therapy programming on the weekends.  Your rehabilitation program will include the following services:  Physical Therapy (PT), Occupational Therapy (OT), 24 hour per day rehabilitation nursing, Therapeutic Recreaction (TR), Psychology, Neuropsychology, Care Coordinator, Rehabilitation Medicine, Nutrition Services, Pharmacy Services and Other  Weekly team conferences will be held on Tuesdays to discuss your progress.  Your Inpatient Rehabilitation Care Coordinator will talk with you frequently to get your input and to update you on team discussions.  Team conferences with you and your family in attendance may also be held.  Expected length of stay: 12-16 days    Overall anticipated outcome: Supervision  Depending on your progress and recovery, your program may change. Your Inpatient Rehabilitation Care Coordinator will coordinate services and will keep you informed of any changes. Your Inpatient Rehabilitation Care Coordinator's name and contact numbers are listed  below.  The following services may also be recommended but are not provided by the [facility] will be made to provide these services after discharge if needed.  Arrangements include referral to agencies that provide these services.  Your insurance has been verified to be:  [place]  Your primary doctor is:  Simona Huh  Pertinent information will be shared with your doctor and your insurance company.  Inpatient Rehabilitation Care Coordinator:  Cathleen Corti [phone] or [phone]  Information discussed with and copy given to patient by: Rana Snare, 09/15/2019, 8:58 AM

## 2019-09-15 NOTE — IPOC Note (Signed)
Overall Plan of Care [facility]) Patient Details Name: Julia Daniels MRN: [id number] DOB: 1950-09-16  Admitting Diagnosis: Debility  Hospital Problems: Principal Problem:   Debility Active Problems:   COPD exacerbation ([place])     Functional Problem List: Nursing Bladder, Bowel, Pain, Safety, Endurance, Skin Integrity  PT Balance, Edema, Endurance, Motor, Nutrition, Pain, Safety  OT Balance, Edema, Endurance, Motor, Pain, Safety  SLP    TR         Basic ADL's: OT Grooming, Bathing, Dressing, Toileting     Advanced  ADL's: OT       Transfers: PT Bed Mobility, Bed to Chair, Car, Manufacturing systems engineer, Metallurgist: PT Ambulation, Stairs     Additional Impairments: OT None  SLP        TR      Anticipated Outcomes Item Anticipated Outcome  Self Feeding MOD I  Swallowing      Basic self-care  MOD I  Toileting  MOD I toilet; S shower   Bathroom Transfers MOD I toilet; S shower  Bowel/Bladder  to be continent x 2  Transfers  mod-I  Locomotion  mod-I household  Communication     Cognition     Pain  less than 3 out of 10  Safety/Judgment  to remian fall free while in rehab   Therapy Plan: PT Intensity: Minimum of 1-2 x/day ,45 to 90 minutes PT Frequency: 5 out of 7 days PT Duration Estimated Length of Stay: 12-16 days OT Intensity: Minimum of 1-2 x/day, 45 to 90 minutes OT Frequency: 5 out of 7 days OT Duration/Estimated Length of Stay: 12-16 days     Due to the current state of emergency, patients may not be receiving their 3-hours of Medicare-mandated therapy.   Team Interventions: Nursing Interventions Patient/Family Education, Disease Management/Prevention, Skin Care/Wound Management, Discharge Planning, Bladder Management, Pain Management, Psychosocial Support, Bowel Management  PT interventions Ambulation/gait training, Community reintegration, DME/adaptive equipment instruction, Neuromuscular re-education, Psychosocial  support, UE/LE Strength taining/ROM, Stair training, Training and development officer, Discharge planning, Functional electrical stimulation, Pain management, Skin care/wound management, UE/LE Coordination activities, Therapeutic Activities, Cognitive remediation/compensation, Disease management/prevention, Functional mobility training, Patient/family education, Splinting/orthotics, Therapeutic Exercise, Visual/perceptual remediation/compensation  OT Interventions Discharge planning, Balance/vestibular training, Pain management, Self Care/advanced ADL retraining, Therapeutic Activities, UE/LE Coordination activities, Disease mangement/prevention, Functional mobility training, Patient/family education, Skin care/wound managment, Therapeutic Exercise, Wheelchair propulsion/positioning, UE/LE Strength taining/ROM, Splinting/orthotics, Psychosocial support, Neuromuscular re-education, DME/adaptive equipment instruction, Community reintegration  SLP Interventions    TR Interventions    SW/CM Interventions Discharge Planning, Psychosocial Support, Patient/Family Education   Barriers to Discharge MD  Medical stability  Nursing      PT Decreased caregiver support, Lack of/limited family support, Other (comments), Home environment access/layout chronic respiratory impairments  OT New oxygen, Inaccessible home environment, Decreased caregiver support, Lack of/limited family support, Weight    SLP      SW       Team Discharge Planning: Destination: PT-Home ,OT- Home , SLP-  Projected Follow-up: PT-Outpatient PT, Other (comment) (intermittent assist), OT-  Home health OT, SLP-  Projected Equipment Needs: PT-To be determined, OT- 3 in 1 bedside comode, Tub/shower bench, SLP-  Equipment Details: PT- , OT-  Patient/family involved in discharge planning: PT- Patient,  OT-Patient, SLP-   MD ELOS: 8-9 days Medical Rehab Prognosis:  Excellent Assessment: Julia Daniels is a 69 year old woman who was admitted to  CIR with decreased functional ability secondary to recurrent COPD exacerbation with compression fracture  T6. She has been instructed to wear her back brace OOB. Her COPD is being managed with nebulizer treatments and prednisone taper. Her pain is being managed with Norco, Baclofen, Lidoderm parch, and Ibuprofen.    See Team Conference Notes for weekly updates to the plan of care

## 2019-09-16 ENCOUNTER — Inpatient Hospital Stay (HOSPITAL_COMMUNITY): Payer: BC Managed Care – PPO

## 2019-09-16 ENCOUNTER — Inpatient Hospital Stay (HOSPITAL_COMMUNITY): Payer: BC Managed Care – PPO | Admitting: Physical Therapy

## 2019-09-16 NOTE — Progress Notes (Signed)
[facility] PHYSICAL MEDICINE & REHABILITATION PROGRESS NOTE   Subjective/Complaints:  Pt reports down to 1.5L Was started on ibuprofen 800 mg for breakthrough pain in spite of Norco - since Norco not lasting long enough.   LBM this AM. Doing well- denies constipation.    ROS:  Pt denies SOB, abd pain, CP, N/V/C/D, and vision changes   Objective:   No results found. Recent Labs    09/15/19 0730  WBC 12.1*  HGB 12.2  HCT 39.4  PLT 250   Recent Labs    09/15/19 0730  NA 137  K 4.3  CL 100  CO2 28  GLUCOSE 117*  BUN 10  CREATININE 0.93  CALCIUM 9.5    Intake/Output Summary (Last 24 hours) at 09/16/2019 0909 Last data filed at 09/16/2019 0729 Gross per 24 hour  Intake 664 ml  Output --  Net 664 ml     Physical Exam: Vital Signs Blood pressure 127/90, pulse 85, temperature 97.6 F (36.4 C), temperature source Oral, resp. rate 18, height 5\' 3"  (1.6 m), weight 90.3 kg, SpO2 99 %. Physical Exam General: .awake, laying in bed- appropriate, NAD HEENT: conjugate gae; O2 is by [place] 1.5L (down from 2L) Heart: RRR Chest: good air movement- CTA b/L on 1.5L  Abdomen: Soft, NT, ND, (+)BS  Extremities: No clubbing, cyanosis, or edema. Pulses are 2+ Skin: Left forearm edema and severe bruising of R forearm/hand Neuro: Patient is alert no acute distress oriented x3 and follows commands. TLSO off since in bed Psych: appropriate     Assessment/Plan: 1. Functional deficits secondary to Debility due to COPD exacerbation, prolonged hospitalization  which require 3+ hours per day of interdisciplinary therapy in a comprehensive inpatient rehab setting.  Physiatrist is providing close team supervision and 24 hour management of active medical problems listed below.  Physiatrist and rehab team continue to assess barriers to discharge/monitor patient progress toward functional and medical goals  Care Tool:  Bathing    Body parts bathed by patient: Chest, Abdomen, Front  perineal area, Face   Body parts bathed by helper: Right arm, Left arm, Buttocks, Right upper leg, Left upper leg, Right lower leg, Left lower leg     Bathing assist Assist Level: Maximal Assistance - Patient 24 - 49%     Upper Body Dressing/Undressing Upper body dressing   What is the patient wearing?: Pull over shirt Orthosis activity level: Performed by helper  Upper body assist Assist Level: Maximal Assistance - Patient 25 - 49%    Lower Body Dressing/Undressing Lower body dressing      What is the patient wearing?: Pants, Underwear/pull up     Lower body assist Assist for lower body dressing: Moderate Assistance - Patient 50 - 74%     Toileting Toileting    Toileting assist Assist for toileting: Minimal Assistance - Patient > 75% Assistive Device Comment:  (BSC)   Transfers Chair/bed transfer  Transfers assist     Chair/bed transfer assist level: Minimal Assistance - Patient > 75%     Locomotion Ambulation   Ambulation assist      Assist level: Contact Guard/Touching assist Assistive device: Walker-rolling Max distance: 45   Walk 10 feet activity   Assist     Assist level: Contact Guard/Touching assist Assistive device: Walker-rolling   Walk 50 feet activity   Assist Walk 50 feet with 2 turns activity did not occur: Safety/medical concerns         Walk 150 feet activity   Assist  Walk 150 feet activity did not occur: Safety/medical concerns         Walk 10 feet on uneven surface  activity   Assist Walk 10 feet on uneven surfaces activity did not occur: Safety/medical concerns         Wheelchair     Assist Will patient use wheelchair at discharge?: No             Wheelchair 50 feet with 2 turns activity    Assist            Wheelchair 150 feet activity     Assist          Blood pressure 127/90, pulse 85, temperature 97.6 F (36.4 C), temperature source Oral, resp. rate 18, height 5\' 3"  (1.6  m), weight 90.3 kg, SpO2 99 %.  Medical Problem List and Plan: 1.  Decreased functional ability secondary to recurrent COPD exacerbation with compression fracture T6.  Back brace when out of bed..  Continue nebulizer treatments as directed.  Prednisone tapered             -patient may shower             -ELOS/Goals: modI 8-9 days  -Continue CIR 2.  Antithrombotics: -DVT/anticoagulation: Lovenox             -antiplatelet therapy: N/A 3. Pain Management: Baclofen 10 mg 3 times daily, Lidoderm patch, hydrocodone as needed. Log roll to minimize back pain  9/5- will increase Norco to 5-10 mg q4 hours prn due to compression fx will hold off on Oxycodone- hopefully, due to sedation risks, won't have to go to Oxy.   9/6: Added ibuprofen 800mg  BID prn for breakthrough pain. Hgb anmd Cr normal  9/7- pain doing better.  4. Mood: Lamictal 200 mg nightly, Effexor 627 mg daily Concerta 72 mg daily Xanax as needed. Has some anxiety.              -antipsychotic agents: Seroquel 150 mg nightly, Risperdal 3 mg nightly 5. Neuropsych: This patient is capable of making decisions on her own behalf. 6. Skin/Wound Care: Routine skin checks 7. Fluids/Electrolytes/Nutrition: Routine in and outs with follow-up chemistries. 8. Acute on chronic respiratory failure with hypoxia: continue to monitor saturations closely with therapy. Wean oxygen as tolerated. - Pt had prn O2 at home, but didn't wear full time.  9/5- still t 2L- weaning as tolerated   9/7- O2 down to 1.5L 9. Leukocytosis  9/5- likely due to steroids- afebrile- no complaints- trending downward 9/6  9/7- WBC down to 12.1- doing better    LOS: 4 days A FACE TO FACE EVALUATION WAS PERFORMED  Soma Lizak 09/16/2019, 9:09 AM

## 2019-09-16 NOTE — Progress Notes (Signed)
Physical Therapy Session Note  Patient Details  Name: Julia Daniels MRN: [id number] Date of Birth: Apr 16, 1950  Today's Date: 09/16/2019 PT Individual Time: 0488-8916 PT Individual Time Calculation (min): 42 min   Short Term Goals: Week 1:  PT Short Term Goal 1 (Week 1): Pt will perform supine<>sit with min assist, not using bed features PT Short Term Goal 2 (Week 1): Pt will perform stand pivot transfers with supervision PT Short Term Goal 3 (Week 1): Pt will ambulate at least 38ft using LRAD with supervision PT Short Term Goal 4 (Week 1): Pt will ascend/descend 4 steps using R HR only with CGA  Skilled Therapeutic Interventions/Progress Updates:     Pt received supine in bed and agreeable to therapy. Initially reports minimal pain in back and having taken pain med prior to therapy, but pain provocation becomes more of an issue as session goes on. Supine to sit with CGA. Sit to stand with CGA and stand step transfer to [facility] with RW and minA for stability during turn to sit. WC transport to dayroom for time management. Pt ambulates 50' x3 reps with CGA, with extended seated rest breaks. Pt uses 2L O2 during session and following 3rd bout of ambulation pt's sats are at 95% with HR of 114. PT cues for pursed lip breathing to optimize oxygen sats, and increasing proximity to RW for safety. WC transport back to room. Pt performs stand step transfer back to bed with CGA and sit to supine with modA. Left with alarm intact and all needs within reach.  Therapy Documentation Precautions:  Precautions Precautions: Fall Precaution Comments: new T6 comp Fx and COPD (home O2 at night/PRN day) Required Braces or Orthoses: Spinal Brace Spinal Brace: Thoracolumbosacral orthotic, Applied in sitting position Restrictions Weight Bearing Restrictions: No Other Position/Activity Restrictions: Don/doff EOB for self teaching    Therapy/Group: Individual Therapy  Breck Coons, PT, DPT 09/16/2019, 12:15  PM

## 2019-09-16 NOTE — Patient Care Conference (Signed)
Inpatient RehabilitationTeam Conference and Plan of Care Update Date: 09/16/2019   Time: 11:24 AM    Patient Name: Julia Daniels      Medical Record Number: [id number]  Date of Birth: 08/27/1950 Sex: Female         Room/Bed: 4M09C/4M09C-01 Payor Info: Payor: [place] / Plan: BCBS COMM PPO / Product Type: *No Product type* /    Admit Date/Time:  09/12/2019  4:51 PM  Primary Diagnosis:  [place] [facility] Problems: Principal Problem:   Debility Active Problems:   COPD exacerbation ([place])    Expected Discharge Date: Expected Discharge Date: 09/26/19  Team Members Present: Physician leading conference: Dr. Courtney Heys Care Coodinator Present: Loralee Pacas, LCSWA;Wilton Thrall Creig Hines, RN, BSN, CRRN Nurse Present: Rayne Du, LPN PT Present: Excell Seltzer, PT OT Present: Roanna Epley, COTA;Jennifer Tamala Julian, OT PPS Coordinator present : Ileana Ladd, Burna Mortimer, SLP     Current Status/Progress Goal Weekly Team Focus  Bowel/Bladder   patient continent of b/b, LBM 9/5  remain continent  assess toileting q shift and prn   Swallow/Nutrition/ Hydration             ADL's   bathing/dressing-mod A/max A; functional tranfsers-min A/CGA; toileting-min A; activity limited by pain  mod I overall  ativity tolerance, BADL training, pain management, safety awareness   Mobility   min A for bed mobility; CGA for sit<>stand transfers; ambulating 15-30' at a time with RW CGA-min A and limited 2/2 pain and decreased endurance  supervision-mod I  increasing edurance, gait training and stair training   Communication             Safety/Cognition/ Behavioral Observations            Pain   pt c/o chronic back pain, has schedueld and prn meds  pain less than 5  assess pain q shift and prn   Skin   abrasion on RUE w/ foam in place, brusing on bilateral arms and abdomen  prevent further skin breakdown  assess skin q shift and prn     Discharge Planning:  Pt to d/c to  home with intermittent support from her son who works 2nd/3rd shift. Pt has been working 3rd shift PTA. Pt would like to return to work if possible. Pt is beginning the process for STD/LTD through employer. FMLA forms have been completed.   Team Discussion: Continent B/B, Skin CDI. Contact guard/min assist with PT. Supervision to Mod I goals. OT Mod I goals. Has tub/shower combo at home, working to be able to step over and into tub. Becomes SOB with activity. Works 3rd shift and would like to be able to return to work. Patient on target to meet rehab goals: yes  *See Care Plan and progress notes for long and short-term goals.   Revisions to Treatment Plan:  None at this time.  Teaching Needs: Continue with family education.  Current Barriers to Discharge: Decreased caregiver support, Home enviroment access/layout, Weight bearing restrictions and New oxygen  Possible Resolutions to Barriers: Continue working on patient endurance, continue current medication regimen for pain.     Medical Summary Current Status: weaning O2- had O2 prn at home- pain is an issue- on Norco/ibuprofen- continent with no wounds  Barriers to Discharge: New oxygen;Weight bearing restrictions;Weight;Decreased family/caregiver support;Home enviroment access/layout;Other (comments);Medical stability  Barriers to Discharge Comments: still works- 3rd shift- wants to return- sent in [place]- pain is usually an issue with compression fx's- additional meds rarely help Possible Resolutions to  Barriers/Weekly Focus: walking 30-40 ft- working on endurance; goals Mod I; LB mod-max A ADLs; pain affects therapy of note; ? anxiety- 9/17   Continued Need for Acute Rehabilitation Level of Care: The patient requires daily medical management by a physician with specialized training in physical medicine and rehabilitation for the following reasons: Direction of a multidisciplinary physical rehabilitation program to maximize functional  independence : Yes Medical management of patient stability for increased activity during participation in an intensive rehabilitation regime.: Yes Analysis of laboratory values and/or radiology reports with any subsequent need for medication adjustment and/or medical intervention. : Yes   I attest that I was present, lead the team conference, and concur with the assessment and plan of the team.   Cristi Loron 09/16/2019, 3:55 PM

## 2019-09-16 NOTE — Progress Notes (Signed)
Patient ID: Julia Daniels, female   DOB: [birth year], 69 y.o.   MRN: [id number]  SW sent FMLA forms to [employer] ([email or web address]) and confirms it has been received.   SW met with pt in room to provide updates from team conference, and d/c date to 9/17. SW informed pt on above with regard to [facility] forms, and original forms provided to pt.   Loralee Pacas, MSW, [place] Office: [phone] Cell: [phone] Fax: [phone]

## 2019-09-16 NOTE — Progress Notes (Signed)
Occupational Therapy Session Note  Patient Details  Name: Julia Daniels MRN: [id number] Date of Birth: 1950/09/06  Today's Date: 09/16/2019 OT Individual Time: 0900-1000 OT Individual Time Calculation (min): 60 min    Short Term Goals: Week 1:  OT Short Term Goal 1 (Week 1): Pt will don shirt with S OT Short Term Goal 2 (Week 1): Pt will don orthosis wiht MIN A OT Short Term Goal 3 (Week 1): Pt will stand pivot to BSC/toilet wiht S OT Short Term Goal 4 (Week 1): Pt will thread BLE into pants wiht S and AE PRN OT Short Term Goal 5 (Week 1): Pt will perform funcitonal task for 2 min prior to needing rest break to demo improved activity tolerance  Skilled Therapeutic Interventions/Progress Updates:    Pt resting in bed upon arrival and agreeable to therapy.  Pt requested to "wash up" but declined changing clothing this morning.  Bed mobility with supervision.  Max A for donning TLSO. Pt amb with RW to w/c by sink for bathing/dressing tasks-CGA. Sit<>stand from w/c with supervision.  Pt required assistance from RN for LB bathing (pt requested female therapist for ADLs and scheduling notified. Pt transported to tub room (time management) and bathroom arrangement discussed.  Pt states her bathroom does not have enough room for TTB.  Pt given info on tub seat. Pt declined attempting stepping over into tub secondary to increased pain with activity.  Pt returned to room and amb with RW from thresh hold to bed.  Pt required assistance placing BLE onto bed. Tot A for repositioning in bed. Pt remained in bed with all needs within reach and bed alarm activated.   Therapy Documentation Precautions:  Precautions Precautions: Fall Precaution Comments: new T6 comp Fx and COPD (home O2 at night/PRN day) Required Braces or Orthoses: Spinal Brace Spinal Brace: Thoracolumbosacral orthotic, Applied in sitting position Restrictions Weight Bearing Restrictions: No Other Position/Activity Restrictions:  Don/doff EOB for self teaching    Pain:  Pt c/o 7/10 pain in L side of back; RN aware and repositioned   Therapy/Group: Individual Therapy  Leroy Libman 09/16/2019, 12:21 PM

## 2019-09-16 NOTE — Progress Notes (Signed)
Physical Therapy Session Note  Patient Details  Name: Julia Daniels MRN: [id number] Date of Birth: 10-02-1950  Today's Date: 09/16/2019 PT Individual Time: 1135-1200; 1600-1700 PT Individual Time Calculation (min): 25 min and 60 min  Short Term Goals: Week 1:  PT Short Term Goal 1 (Week 1): Pt will perform supine<>sit with min assist, not using bed features PT Short Term Goal 2 (Week 1): Pt will perform stand pivot transfers with supervision PT Short Term Goal 3 (Week 1): Pt will ambulate at least 29ft using LRAD with supervision PT Short Term Goal 4 (Week 1): Pt will ascend/descend 4 steps using R HR only with CGA  Skilled Therapeutic Interventions/Progress Updates:    Session 1: Pt received seated in bed, agreeable to PT session. Pt reports some pain in back at rest that increases with mobility, premedicated prior to start of therapy session. Supine to sit via logroll with CGA, HOB elevated and use of bedrails. Assisted pt with donning TLSO while seated EOB. Sit to stand with CGA to RW. Ambulation x 85, x 105 ft with RW and close Supervision to [place]. Pt takes several standing rest breaks during gait this date. Pt on 1.5-2L O2 throughout session, SpO2 remains at 95% (+) even with activity. Pt initially agreeable to stay up in w/c for lunch but then due to pain requests to lay back down until her food arrives. Sit to supine mod A for BLE management. Pt left semi-reclined in bed with needs in reach, bed alarm in place at end of session.  Session 2: Pt received seated in bed, agreeable to PT session. Pt reports some pain in back but premedicated prior to start of therapy session. Supine to sit with CGA with HOB elevated and use of bedrail. Sit to stand with Supervision to CGA to RW throughout session. Ambulation x 150 ft with RW and close Supervision with 2 standing rest breaks. Ascend/descend 4 x 6" stairs with R handrail laterally x 2 repetitions with standing rest break in between.  Ascend/descend one 4" curb step with RW and CGA for balance to simulate home environment. Trial gait with rollator 2 x 50 ft with CGA for balance. Pt unable to trial sitting on rollator due to hip width. Pt may benefit from use of a rollator in the home due to decreased overall endurance and need for frequent rest breaks, however pt would not be able to navigate curb step safely with rollator. Pt requests to return to bed at end of session. Sit to supine mod A for BLE management. Pt left semi-reclined in bed with needs in reach, bed alarm in place at end of session. Pt on 1.5-2L O2 via nasal cannula throughout session, SpO2 remains at 93% (+) with activity.  Therapy Documentation Precautions:  Precautions Precautions: Fall Precaution Comments: new T6 comp Fx and COPD (home O2 at night/PRN day) Required Braces or Orthoses: Spinal Brace Spinal Brace: Thoracolumbosacral orthotic, Applied in sitting position Restrictions Weight Bearing Restrictions: No Other Position/Activity Restrictions: Don/doff EOB for self teaching   Therapy/Group: Individual Therapy   Excell Seltzer, PT, DPT  09/16/2019, 12:05 PM

## 2019-09-17 ENCOUNTER — Inpatient Hospital Stay (HOSPITAL_COMMUNITY): Payer: BC Managed Care – PPO

## 2019-09-17 ENCOUNTER — Inpatient Hospital Stay (HOSPITAL_COMMUNITY): Payer: BC Managed Care – PPO | Admitting: Occupational Therapy

## 2019-09-17 ENCOUNTER — Inpatient Hospital Stay (HOSPITAL_COMMUNITY): Payer: BC Managed Care – PPO | Admitting: Physical Therapy

## 2019-09-17 ENCOUNTER — Inpatient Hospital Stay: Payer: BC Managed Care – PPO | Admitting: Pulmonary Disease

## 2019-09-17 NOTE — Progress Notes (Signed)
[place] PHYSICAL MEDICINE & REHABILITATION PROGRESS NOTE   Subjective/Complaints:  Pt Pt still at 1.5L O2- still having some SOB intermittently per pt.   Feeling good overall- therapy going well.  LBM 2 days ago- usually goes every other day at home- if no BM by tomorrow AM, will try Sorbitol.   Feels like can have BM today.   ROS:  Pt denies abd pain, CP, N/V/C/D, and vision changes  Objective:   No results found. Recent Labs    09/15/19 0730  WBC 12.1*  HGB 12.2  HCT 39.4  PLT 250   Recent Labs    09/15/19 0730  NA 137  K 4.3  CL 100  CO2 28  GLUCOSE 117*  BUN 10  CREATININE 0.93  CALCIUM 9.5    Intake/Output Summary (Last 24 hours) at 09/17/2019 0900 Last data filed at 09/16/2019 1842 Gross per 24 hour  Intake 442 ml  Output --  Net 442 ml     Physical Exam: Vital Signs Blood pressure 103/70, pulse 83, temperature 98.8 F (37.1 C), resp. rate 18, height 5\' 3"  (1.6 m), weight 90.3 kg, SpO2 96 %. Physical Exam General: .awake, supine in bed; appropriate, NAD HEENT: conjugate gae; O2 is by [place] 1.5L still- no change Heart: RRR Chest: CTA B/L- no W/R/R- good air movement On 1.5 L O2 by [place] Abdomen: Soft, NT, ND, (+)BS  Extremities: No clubbing, cyanosis, or edema. Pulses are 2+ Skin: Left forearm edema and severe bruising of R forearm/hand Neuro: Patient is alert no acute distress oriented x3 and follows commands. TLSO off since in bed Psych: smiling     Assessment/Plan: 1. Functional deficits secondary to Debility due to COPD exacerbation, prolonged hospitalization  which require 3+ hours per day of interdisciplinary therapy in a comprehensive inpatient rehab setting.  Physiatrist is providing close team supervision and 24 hour management of active medical problems listed below.  Physiatrist and rehab team continue to assess barriers to discharge/monitor patient progress toward functional and medical goals  Care Tool:  Bathing    Body parts  bathed by patient: Chest, Abdomen, Front perineal area, Face   Body parts bathed by helper: Right arm, Left arm, Buttocks, Right upper leg, Left upper leg, Right lower leg, Left lower leg     Bathing assist Assist Level: Maximal Assistance - Patient 24 - 49%     Upper Body Dressing/Undressing Upper body dressing   What is the patient wearing?: Pull over shirt Orthosis activity level: Performed by helper  Upper body assist Assist Level: Maximal Assistance - Patient 25 - 49%    Lower Body Dressing/Undressing Lower body dressing      What is the patient wearing?: Pants, Underwear/pull up     Lower body assist Assist for lower body dressing: Moderate Assistance - Patient 50 - 74%     Toileting Toileting    Toileting assist Assist for toileting: Minimal Assistance - Patient > 75% Assistive Device Comment:  (BSC)   Transfers Chair/bed transfer  Transfers assist     Chair/bed transfer assist level: Minimal Assistance - Patient > 75%     Locomotion Ambulation   Ambulation assist      Assist level: Contact Guard/Touching assist Assistive device: Walker-rolling Max distance: 50'   Walk 10 feet activity   Assist     Assist level: Contact Guard/Touching assist Assistive device: Walker-rolling   Walk 50 feet activity   Assist Walk 50 feet with 2 turns activity did not occur: Safety/medical concerns  Assist level: Contact Guard/Touching assist Assistive device: Walker-rolling    Walk 150 feet activity   Assist Walk 150 feet activity did not occur: Safety/medical concerns         Walk 10 feet on uneven surface  activity   Assist Walk 10 feet on uneven surfaces activity did not occur: Safety/medical concerns         Wheelchair     Assist Will patient use wheelchair at discharge?: No             Wheelchair 50 feet with 2 turns activity    Assist            Wheelchair 150 feet activity     Assist          Blood  pressure 103/70, pulse 83, temperature 98.8 F (37.1 C), resp. rate 18, height 5\' 3"  (1.6 m), weight 90.3 kg, SpO2 96 %.  Medical Problem List and Plan: 1.  Decreased functional ability secondary to recurrent COPD exacerbation with compression fracture T6.  Back brace when out of bed..  Continue nebulizer treatments as directed.  Prednisone tapered             -patient may shower             -ELOS/Goals: modI 8-9 days  -Continue CIR 2.  Antithrombotics: -DVT/anticoagulation: Lovenox 9/8- needs until d/c from hospital- won't need at home             -antiplatelet therapy: N/A 3. Pain Management: Baclofen 10 mg 3 times daily, Lidoderm patch, hydrocodone as needed. Log roll to minimize back pain  9/5- will increase Norco to 5-10 mg q4 hours prn due to compression fx will hold off on Oxycodone- hopefully, due to sedation risks, won't have to go to Oxy.   9/6: Added ibuprofen 800mg  BID prn for breakthrough pain. Hgb anmd Cr normal  9/8- pain doing better, but till sometimes impacts therapy per OT and PT- will monitor 4. Mood: Lamictal 200 mg nightly, Effexor 025 mg daily Concerta 72 mg daily Xanax as needed. Has some anxiety.              -antipsychotic agents: Seroquel 150 mg nightly, Risperdal 3 mg nightly 5. Neuropsych: This patient is capable of making decisions on her own behalf. 6. Skin/Wound Care: Routine skin checks 7. Fluids/Electrolytes/Nutrition: Routine in and outs with follow-up chemistries. 8. Acute on chronic respiratory failure with hypoxia: continue to monitor saturations closely with therapy. Wean oxygen as tolerated. - Pt had prn O2 at home, but didn't wear full time.  9/5- still t 2L- weaning as tolerated   9/7- O2 down to 1.5L  9/8- continue to wean O2 as tolerated- will ask therapy to try 1L, if possible.  9. Leukocytosis  9/5- likely due to steroids- afebrile- no complaints- trending downward 9/6  9/7- WBC down to 12.1- doing better    LOS: 5 days A FACE TO FACE  EVALUATION WAS PERFORMED  Kinsey Cowsert 09/17/2019, 9:00 AM

## 2019-09-17 NOTE — Progress Notes (Signed)
Occupational Therapy Session Note  Patient Details  Name: Julia Daniels MRN: [id number] Date of Birth: [birth year]  Today's Date: 09/17/2019 OT Individual Time: 1100-1154 OT Individual Time Calculation (min): 54 min    Short Term Goals: Week 1:  OT Short Term Goal 1 (Week 1): Pt will don shirt with S OT Short Term Goal 2 (Week 1): Pt will don orthosis wiht MIN A OT Short Term Goal 3 (Week 1): Pt will stand pivot to BSC/toilet wiht S OT Short Term Goal 4 (Week 1): Pt will thread BLE into pants wiht S and AE PRN OT Short Term Goal 5 (Week 1): Pt will perform funcitonal task for 2 min prior to needing rest break to demo improved activity tolerance  Skilled Therapeutic Interventions/Progress Updates:    Pt greeted at time of session reclined in bed with son present, agreeable to OT session for ADL. Pain throughout session despite premedicated before session, c/o 8/10 at worst time and rest breaks provided as needed. Supine to sit EOB supervision, donned TLSO Max A and ambulated to bathroom CS/CGA with RW, transferred to toilet in same manner. Able to don/doff clothing but assist with hygiene in front d/t pain today. Ambulated to sink CGA with RW and performed UB/LB bathing seated in w/c. UB bathe Supervision, LB bathing Mod A with assist to wash periarea and buttocks in standing, discussed future use of LHS. UB dress Min for shirt, underwear and pants with reacher Mod overall with some assist to thread d/t training with reacher but pt able to stand and donn over hips. Ambulated back to bed with CGA/CS and sit to supine Min A d/t pain in low back. Pt remained off O2 during majority of ADL, sats remained 93% or higher. Alarm on, call bell in reach.   Therapy Documentation Precautions:  Precautions Precautions: Fall Precaution Comments: new T6 comp Fx and COPD (home O2 at night/PRN day) Required Braces or Orthoses: Spinal Brace Spinal Brace: Thoracolumbosacral orthotic, Applied in sitting  position Restrictions Weight Bearing Restrictions: No Other Position/Activity Restrictions: Don/doff EOB for self teaching     Therapy/Group: Individual Therapy  Viona Gilmore 09/17/2019, 12:04 PM

## 2019-09-17 NOTE — Progress Notes (Signed)
Occupational Therapy Session Note  Patient Details  Name: Julia Daniels MRN: [id number] Date of Birth: [birth year]  Today's Date: 09/17/2019 OT Individual Time: 1345-1425 OT Individual Time Calculation (min): 40 min    Short Term Goals: Week 1:  OT Short Term Goal 1 (Week 1): Pt will don shirt with S OT Short Term Goal 2 (Week 1): Pt will don orthosis wiht MIN A OT Short Term Goal 3 (Week 1): Pt will stand pivot to BSC/toilet wiht S OT Short Term Goal 4 (Week 1): Pt will thread BLE into pants wiht S and AE PRN OT Short Term Goal 5 (Week 1): Pt will perform funcitonal task for 2 min prior to needing rest break to demo improved activity tolerance  Skilled Therapeutic Interventions/Progress Updates:    Pt sitting EOB upon arrival. Pt ready for therapy.  Pt required mod A for donning orthosis seated EOB. Functional amb with RW from room to ADL apartment and return with rest break in ADL apartment. Pt uncomfortable seated in w/c with relief noted when standing. Pt SOB throughout session; O2 sats>93% on 2 L O2. Pt required standing rest breaks X 3 with ambulation. Discussed sleeping arrangements at home and pt states her bed is too low and she was having difficulty getting OOB prior to this hospital admission. Pt returned to room and transferred to bed.  Sit>supine with CGA. Pt required assistance repositioning in bed. Pt remained in bed with all needs within reach and RN present.   Therapy Documentation Precautions:  Precautions Precautions: Fall Precaution Comments: new T6 comp Fx and COPD (home O2 at night/PRN day) Required Braces or Orthoses: Spinal Brace Spinal Brace: Thoracolumbosacral orthotic, Applied in sitting position Restrictions Weight Bearing Restrictions: No Other Position/Activity Restrictions: Don/doff EOB for self teaching Pain:  Pt c/o 7/10 back pain with L>R and occasional episodes where pain increased momentarily to 10/10; repositioned and RN aware (Kpad provided at  end of session).   Therapy/Group: Individual Therapy  Leroy Libman 09/17/2019, 2:35 PM

## 2019-09-17 NOTE — Progress Notes (Signed)
Physical Therapy Session Note  Patient Details  Name: Julia Daniels MRN: [id number] Date of Birth: 12-05-1950  Today's Date: 09/17/2019 PT Individual Time: 9528-4132; 0935-1000; 1600-1630 PT Individual Time Calculation (min): 40 min and 25 min and 30 min PT Missed Time: 10 min Missed Time Reason: pain  Short Term Goals: Week 1:  PT Short Term Goal 1 (Week 1): Pt will perform supine<>sit with min assist, not using bed features PT Short Term Goal 2 (Week 1): Pt will perform stand pivot transfers with supervision PT Short Term Goal 3 (Week 1): Pt will ambulate at least 66ft using LRAD with supervision PT Short Term Goal 4 (Week 1): Pt will ascend/descend 4 steps using R HR only with CGA  Skilled Therapeutic Interventions/Progress Updates:    Session 1: Pt received seated in bed, agreeable to PT session. No complaints of pain initially. Supine to sit with CGA with HOB elevated and use of bedrail. Assisted pt with donning TLSO while seated EOB. Sit to stand at Supervision level to RW. Pt requesting to use the bathroom. Ambulation into bathroom with RW and Supervision. Pt is Supervision for toilet transfer, clothing management, and pericare. Ambulation x 150 ft with RW and close Supervision with 3 standing rest breaks due to fatigue. Pt on room air during activity, SpO2 remains at 92% and above with activity. Pt does quickly become SOA during activity, reviewed pursed lip breathing techniques. Trial gait with wider rollator x 100 ft and CGA for balance. Attempt to have pt sit on rollator, pt has increase in back pain and discomfort and unable to comfortably sit on rollator. Returned to sitting in w/c for rest break. Pt then has onset of severe back pain and unable to continue session. Assisted pt back to bed with mod A for BLE management. Provided short-acting hot pack to mid-upper back for pain x 10 min. Requested k-pad order from PA for pain management. Pt left semi-reclined in bed with needs in  reach. Pt missed 10 min of scheduled therapy session due to pain.  Session 2: Returned after 10 min to reassess pt's pain level and reaction to hot pack. Pt reports improvement in pain from hot pack, no redness or irritation noted to skin from hotpack use. Pt agreeable to attempt to complete remaining therapy time this AM. Session focus on bed mobility as pt has low, soft bed at home. Pt is able to perform rolling L/R with use of bedrail at Supervision level. Pt unable to tolerate lying supine on a flat bed due to back pain. Per pt report she has a wedge pillow at home that allows her to sit in semi-upright position in bed. Semi-reclined to sit with CGA with use of bedrail. Pt then able to perform one repetition of returning to supine with CGA and able to manage BLE more independently. However, pt has increase in low back pain while managing her own LE in/out of bed. Pt returned to sitting with CGA. Pt then returns to supine again with mod A needed for BLE management. Provided handout for where to purchase bedrail but pt may require use of a hospital bed upon d/c home pending progress towards independence with bed mobility. Pt left semi-reclined in bed with needs in reach at end of session.  Session 3: Pt received semi-reclined in bed, agreeable to PT session. Pt reports significant improvement in pain from AM sessions and reports kpad has been helping with pain management. Semi-reclined to sitting EOB with Supervision with HOB elevated and  use of bedrail. Assisted pt with donning TLSO while seated EOB. Sit to stand with Supervision to RW throughout session. Session focus on endurance with gait and RW. Pt able to ambulate 1 x 150 ft, 2 x 100 ft with RW at Supervision level with several standing rest breaks. Pt initially on room air during ambulation with SpO2 remaining at 91% and higher with activity but as session progresses SpO2 drops to 85%. Increased pt to 1L O2 and SpO2 remains at 93% (+) with activity.  Pt requests to return to bed at end of session. Sit to semi-reclined in bed with CGA, improvement in ability to navigate BLE into bed. Pt left semi-reclined in bed with needs in reach, bed alarm in place at end of session.  Therapy Documentation Precautions:  Precautions Precautions: Fall Precaution Comments: new T6 comp Fx and COPD (home O2 at night/PRN day) Required Braces or Orthoses: Spinal Brace Spinal Brace: Thoracolumbosacral orthotic, Applied in sitting position Restrictions Weight Bearing Restrictions: No Other Position/Activity Restrictions: Don/doff EOB for self teaching General: PT Amount of Missed Time (min): 10 Minutes PT Missed Treatment Reason: Pain    Therapy/Group: Individual Therapy   Excell Seltzer, PT, DPT  09/17/2019, 11:46 AM

## 2019-09-18 ENCOUNTER — Inpatient Hospital Stay (HOSPITAL_COMMUNITY): Payer: BC Managed Care – PPO | Admitting: Occupational Therapy

## 2019-09-18 ENCOUNTER — Inpatient Hospital Stay (HOSPITAL_COMMUNITY): Payer: BC Managed Care – PPO

## 2019-09-18 ENCOUNTER — Inpatient Hospital Stay (HOSPITAL_COMMUNITY): Payer: BC Managed Care – PPO | Admitting: Physical Therapy

## 2019-09-18 MED ORDER — LIDOCAINE 5 % EX PTCH
2.0000 | MEDICATED_PATCH | CUTANEOUS | Status: DC
  Administered 2019-09-19 – 2019-09-28 (×10): 2 via TRANSDERMAL
  Filled 2019-09-18 (×10): qty 2

## 2019-09-18 MED ORDER — SORBITOL 70 % SOLN
30.0000 mL | Freq: Once | Status: DC
  Filled 2019-09-18 (×2): qty 30

## 2019-09-18 NOTE — Progress Notes (Signed)
Occupational Therapy Weekly Progress Note  Patient Details  Name: Julia Daniels MRN: [id number] Date of Birth: March 31, 1950  Beginning of progress report period: September 13, 2019 End of progress report period: September 18, 2019  Today's Date: 09/18/2019 OT Individual Time: 1101-1159 OT Individual Time Calculation (min): 58 min    Patient has met 2 of 5 short term goals.  Pt has improved endurance/activity tolerance for ADL routines and can perform without O2 at times. Ambulated with RW to/from bathroom with CGA/CS. Pt has been introduced to AE for reacher and LHS, plan to continue to use in sessions for max carryover to perform ADLs within back precautions. Pt continues to require assist to don/doff brace but will continue to focus on pt donning/doffing.   Patient continues to demonstrate the following deficits: muscle weakness, decreased cardiorespiratoy endurance and decreased standing balance, decreased balance strategies and difficulty maintaining precautions and therefore will continue to benefit from skilled OT intervention to enhance overall performance with BADL.  Patient progressing toward long term goals..  Continue plan of care.  OT Short Term Goals Week 1:  OT Short Term Goal 1 (Week 1): Pt will don shirt with S OT Short Term Goal 1 - Progress (Week 1): Met OT Short Term Goal 2 (Week 1): Pt will don orthosis wiht MIN A OT Short Term Goal 2 - Progress (Week 1): Progressing toward goal OT Short Term Goal 3 (Week 1): Pt will stand pivot to BSC/toilet wiht S OT Short Term Goal 3 - Progress (Week 1): Progressing toward goal OT Short Term Goal 4 (Week 1): Pt will thread BLE into pants wiht S and AE PRN OT Short Term Goal 4 - Progress (Week 1): Progressing toward goal OT Short Term Goal 5 (Week 1): Pt will perform funcitonal task for 2 min prior to needing rest break to demo improved activity tolerance OT Short Term Goal 5 - Progress (Week 1): Met Week 2:  OT Short Term Goal 1  (Week 2): Pt will perform funcitonal task for 3-5 min prior to needing rest break to demo improved activity tolerance OT Short Term Goal 2 (Week 2): Pt will thread BLE into pants with S and AE PRN OT Short Term Goal 3 (Week 2): Pt will perform 3/3 toileting tasks with S with AE PRN OT Short Term Goal 4 (Week 2): Pt will perform LB bathing with S with AE PRN  Skilled Therapeutic Interventions/Progress Updates:    Pt greeted at time of session supine in bed with HOB elevated, resting comfortably on 2L via [place]. Agreeable to OT session, some c/o pain today with movement but no number given, improved from yesterday. Supine to sit EOB Supervision, donned brace max A, ambulated to bathroom CS and transferred to toilet in same manner. CS for clothing management but did require assist for hygiene in front and back d/t decreased ability to reach with back precautions, plan to give cleansing aide. Ambulated to sink level and performed UB/LB bathe with Supervision for UB bathing in sitting and Min-Mod for LB bathing in sitting, provided LHS for next session. Assist to reach buttocks d/t back precautions, pt open to having two LHS one for back/BLEs and one for buttocks. Donned shirt with Supervisoin, brace Max and pants/underwear with Min, only assist to thread but pt has practiced with reacher in the past and did well. Ambulated back to bed CS/CGA with RW and sit to supine Min for back precautions and bringing BLEs up in unison. Alarm on, call bell  in reach.    Therapy Documentation Precautions:  Precautions Precautions: Fall Precaution Comments: new T6 comp Fx and COPD (home O2 at night/PRN day) Required Braces or Orthoses: Spinal Brace Spinal Brace: Thoracolumbosacral orthotic, Applied in sitting position Restrictions Weight Bearing Restrictions: No Other Position/Activity Restrictions: Don/doff EOB for self teaching    Therapy/Group: Individual Therapy  Viona Gilmore 09/18/2019, 12:10 PM

## 2019-09-18 NOTE — Plan of Care (Signed)
Problem: RH BLADDER ELIMINATION Goal: RH STG MANAGE BLADDER WITH ASSISTANCE Description: STG Manage Bladder With min Assistance Outcome: Progressing Goal: RH STG MANAGE BLADDER WITH EQUIPMENT WITH ASSISTANCE Description: STG Manage Bladder With Equipment With min Assistance Outcome: Progressing  Problem: RH BOWEL ELIMINATION Goal: RH STG MANAGE BOWEL WITH ASSISTANCE Description: STG Manage Bowel with min Assistance. Outcome: Progressing Goal: RH STG MANAGE BOWEL W/MEDICATION W/ASSISTANCE Description: STG Manage Bowel with Medication with min Assistance. Outcome: Progressing  Problem: RH SKIN INTEGRITY Goal: RH STG SKIN FREE OF INFECTION/BREAKDOWN Description: Skin will be free of infection/breakdown with min assist Outcome: Progressing  Problem: RH SAFETY Goal: RH STG ADHERE TO SAFETY PRECAUTIONS W/ASSISTANCE/DEVICE Description: STG Adhere to Safety Precautions With min Assistance/Device. Outcome: Progressing

## 2019-09-18 NOTE — Progress Notes (Signed)
[facility] PHYSICAL MEDICINE & REHABILITATION PROGRESS NOTE   Subjective/Complaints:  Hasn't had BM yet- will order Sorbitol at 1pm, if no BM by then.   Also, felt SOB yesterday for 1 episode- wondering if can increase O2 levels if needs to- explained we can if her O2 levels drop.   Asking to move lidoderm patches to daytime, not nighttime. Will make changes.    ROS: Pt denies SOB, abd pain, CP, N/V/C/D, and vision changes   Objective:   No results found. No results for input(s): WBC, HGB, HCT, PLT in the last 72 hours. No results for input(s): NA, K, CL, CO2, GLUCOSE, BUN, CREATININE, CALCIUM in the last 72 hours.  Intake/Output Summary (Last 24 hours) at 09/18/2019 0852 Last data filed at 09/18/2019 0836 Gross per 24 hour  Intake 1184 ml  Output --  Net 1184 ml     Physical Exam: Vital Signs Blood pressure 138/90, pulse 86, temperature 98.8 F (37.1 C), temperature source Oral, resp. rate 20, height 5\' 3"  (1.6 m), weight 90.3 kg, SpO2 97 %. Physical Exam General: .just woke up- laying in bed- breakfast at bedside- hasn't eaten,, NAD HEENT: conjugate gaze; O2 is by Drain 1.5L still- no change Heart: RRR Chest: CTA B/L- no W/R/R- good air movement- On 1.5 L O2 by [place] Abdomen: Soft, NT, ND, (+)BS   Extremities: No clubbing, cyanosis, or edema. Pulses are 2+ Skin: Left forearm edema and severe bruising of R forearm/hand Neuro: Ox3 Psych: sleepy     Assessment/Plan: 1. Functional deficits secondary to Debility due to COPD exacerbation, prolonged hospitalization  which require 3+ hours per day of interdisciplinary therapy in a comprehensive inpatient rehab setting.  Physiatrist is providing close team supervision and 24 hour management of active medical problems listed below.  Physiatrist and rehab team continue to assess barriers to discharge/monitor patient progress toward functional and medical goals  Care Tool:  Bathing    Body parts bathed by patient: Chest,  Abdomen, Face, Right arm, Left arm, Right upper leg, Left upper leg   Body parts bathed by helper: Buttocks, Right lower leg, Left lower leg, Front perineal area     Bathing assist Assist Level: Moderate Assistance - Patient 50 - 74%     Upper Body Dressing/Undressing Upper body dressing   What is the patient wearing?: Pull over shirt, Orthosis Orthosis activity level: Performed by helper  Upper body assist Assist Level: Moderate Assistance - Patient 50 - 74%    Lower Body Dressing/Undressing Lower body dressing      What is the patient wearing?: Pants, Underwear/pull up     Lower body assist Assist for lower body dressing: Moderate Assistance - Patient 50 - 74%     Toileting Toileting    Toileting assist Assist for toileting: Minimal Assistance - Patient > 75% Assistive Device Comment:  (BSC)   Transfers Chair/bed transfer  Transfers assist     Chair/bed transfer assist level: Supervision/Verbal cueing     Locomotion Ambulation   Ambulation assist      Assist level: Supervision/Verbal cueing Assistive device: Walker-rolling Max distance: 150 (3 standing rest breaks)   Walk 10 feet activity   Assist     Assist level: Supervision/Verbal cueing Assistive device: Walker-rolling   Walk 50 feet activity   Assist Walk 50 feet with 2 turns activity did not occur: Safety/medical concerns  Assist level: Supervision/Verbal cueing Assistive device: Walker-rolling    Walk 150 feet activity   Assist Walk 150 feet activity did not  occur: Safety/medical concerns  Assist level: Supervision/Verbal cueing Assistive device: Walker-rolling    Walk 10 feet on uneven surface  activity   Assist Walk 10 feet on uneven surfaces activity did not occur: Safety/medical concerns         Wheelchair     Assist Will patient use wheelchair at discharge?: No             Wheelchair 50 feet with 2 turns activity    Assist             Wheelchair 150 feet activity     Assist          Blood pressure 138/90, pulse 86, temperature 98.8 F (37.1 C), temperature source Oral, resp. rate 20, height 5\' 3"  (1.6 m), weight 90.3 kg, SpO2 97 %.  Medical Problem List and Plan: 1.  Decreased functional ability secondary to recurrent COPD exacerbation with compression fracture T6.  Back brace when out of bed..  Continue nebulizer treatments as directed.  Prednisone tapered             -patient may shower             -ELOS/Goals: modI 8-9 days  -Continue CIR 2.  Antithrombotics: -DVT/anticoagulation: Lovenox 9/8- needs until d/c from hospital- won't need at home             -antiplatelet therapy: N/A 3. Pain Management: Baclofen 10 mg 3 times daily, Lidoderm patch, hydrocodone as needed. Log roll to minimize back pain  9/5- will increase Norco to 5-10 mg q4 hours prn due to compression fx will hold off on Oxycodone- hopefully, due to sedation risks, won't have to go to Oxy.   9/6: Added ibuprofen 800mg  BID prn for breakthrough pain. Hgb anmd Cr normal  9/8- pain doing better, but till sometimes impacts therapy per OT and PT- will monitor  9/9- will move lidoderm patches to daytime 8am to 8pm to start in AM 4. Mood: Lamictal 200 mg nightly, Effexor 751 mg daily Concerta 72 mg daily Xanax as needed. Has some anxiety.              -antipsychotic agents: Seroquel 150 mg nightly, Risperdal 3 mg nightly 5. Neuropsych: This patient is capable of making decisions on her own behalf. 6. Skin/Wound Care: Routine skin checks 7. Fluids/Electrolytes/Nutrition: Routine in and outs with follow-up chemistries. 8. Acute on chronic respiratory failure with hypoxia: continue to monitor saturations closely with therapy. Wean oxygen as tolerated. - Pt had prn O2 at home, but didn't wear full time.  9/5- still t 2L- weaning as tolerated   9/7- O2 down to 1.5L  9/8- continue to wean O2 as tolerated- will ask therapy to try 1L, if possible.   9/8-  pt had episode of SOB/DOE yesterday and needed 2L O2- will wean AS TOLERATED 9. Leukocytosis  9/5- likely due to steroids- afebrile- no complaints- trending downward 9/6  9/7- WBC down to 12.1- doing better 10. Constipation  9/9- has been 3 days- if no BM by 1pm, ordered sorbitol    LOS: 6 days A FACE TO FACE EVALUATION WAS PERFORMED  Julia Daniels 09/18/2019, 8:52 AM

## 2019-09-18 NOTE — Progress Notes (Signed)
Physical Therapy Session Note  Patient Details  Name: Julia Daniels MRN: [id number] Date of Birth: 1950-04-13  Today's Date: 09/18/2019 PT Individual Time: 0846-1000 PT Individual Time Calculation (min): 74 min   Short Term Goals: Week 1:  PT Short Term Goal 1 (Week 1): Pt will perform supine<>sit with min assist, not using bed features PT Short Term Goal 2 (Week 1): Pt will perform stand pivot transfers with supervision PT Short Term Goal 3 (Week 1): Pt will ambulate at least 60ft using LRAD with supervision PT Short Term Goal 4 (Week 1): Pt will ascend/descend 4 steps using R HR only with CGA  Skilled Therapeutic Interventions/Progress Updates:    pt received in bed and agreeable to therapy. Pt called nursing for pain medications, nursing brought medications at start of session. Pt reported 6/10 pain at start of session and 8/10 at end of session in mid, L back. Pt directed in supine>sit with log rolling to R with (+) bedrails at CGA. Pt directed in gait training 10' with RW to [facility] at [facility] with O2 via [place] in place 1.5 L. Pt taken to gym in [facility] total A for time management. Pt directed in NuStep for total of 6 mins with pt requiring x3 rest breaks to complete this amount of activity 2/2 fatigue. Pt transferred to/from [facility] with RW. Pt directed in gait training with RW for 150' x2 with x1 standing rest breaks each rep at [facility] with VC for breathing tech and gait pattern. Pt directed in standing balance training without UE support on firm surface for x10 alternating reaching forward for functional standing tolerance and improved dynamic standing balance for decreased fall risk, CGA. Pt reported having to use restroom and taken to restroom in [facility] for time. CGA for transfer to toilet with HHA. Pt unable to void and reported greatly increased back pain while sitting on toilet and requested to return to [facility]. Pt taken back to room in [facility] total A for time to return to bed. Pt directed in 10' gait without AD  min A-CGA, sit EOB CGA, sit>supine with rolling CGA and upward scooting CGA with hospital bed functions. Pt left in bed, alarm on , All needs in reach and in good condition. Call light in hand.   Pt on O2 via [place] throughout entire session. Pt reported she feels her biggest limiting factor is pain despite feeling short of breath with activity   Therapy Documentation Precautions:  Precautions Precautions: Fall Precaution Comments: new T6 comp Fx and COPD (home O2 at night/PRN day) Required Braces or Orthoses: Spinal Brace Spinal Brace: Thoracolumbosacral orthotic, Applied in sitting position Restrictions Weight Bearing Restrictions: No Other Position/Activity Restrictions: Don/doff EOB for self teaching    Pain: Pain Assessment Pain Scale: 0-10 Pain Score: 8  Pain Type: Chronic pain Pain Location: Back Pain Orientation: Left;Mid Pain Descriptors / Indicators: Aching;Discomfort;Sore;Tender Pain Frequency: Constant Pain Onset: On-going Patients Stated Pain Goal: 2 Pain Intervention(s): Medication (See eMAR);RN made aware;Repositioned;Distraction;Emotional support;Environmental changes Multiple Pain Sites: No    Therapy/Group: Individual Therapy  [name] 09/18/2019, 10:24 AM

## 2019-09-18 NOTE — Progress Notes (Signed)
Occupational Therapy Session Note  Patient Details  Name: Julia Daniels MRN: [id number] Date of Birth: October 23, 1950  Today's Date: 09/18/2019 OT Individual Time: 1330-1430 OT Individual Time Calculation (min): 60 min    Short Term Goals: Week 2:  OT Short Term Goal 1 (Week 2): Pt will perform funcitonal task for 3-5 min prior to needing rest break to demo improved activity tolerance OT Short Term Goal 2 (Week 2): Pt will thread BLE into pants with S and AE PRN OT Short Term Goal 3 (Week 2): Pt will perform 3/3 toileting tasks with S with AE PRN OT Short Term Goal 4 (Week 2): Pt will perform LB bathing with S with AE PRN  Skilled Therapeutic Interventions/Progress Updates:    Pt resting in bed upon arrival and eager for therapy.  OT intervention with focus bed mobility, functional amb with RW, standing balance, DME recommendations, and activity tolerance to increase independence with BADLs. Supine>sit EOB with supervision. Pt erquired mod A for donning TLSO. Pt amb with RW to small gym with CGA. Pt engaged in [employer] activities X 4 with focus on increased standing tolerance-54 secs, 60 secs, 80 secs, and 120 secs with extended rest breaks. O2 sats>94% throughout.  Discussed shower setup at home. Pt practiced TTB transfers with min A and provided literature on DME. Recommended BSC and pt will notify when decision made. Pt returned to room and required CGA for sit>supine on bed. Pt remained in bed with all needs within reach and bed alarm activated.   Therapy Documentation Precautions:  Precautions Precautions: Fall Precaution Comments: new T6 comp Fx and COPD (home O2 at night/PRN day) Required Braces or Orthoses: Spinal Brace Spinal Brace: Thoracolumbosacral orthotic, Applied in sitting position Restrictions Weight Bearing Restrictions: No Other Position/Activity Restrictions: Don/doff EOB for self teaching Pain:  Pt c/o 6/10 back pain; pt received meds approx 40 mins prior to  therapy   Therapy/Group: Individual Therapy  Leroy Libman 09/18/2019, 2:46 PM

## 2019-09-19 ENCOUNTER — Telehealth: Payer: Self-pay | Admitting: Internal Medicine

## 2019-09-19 ENCOUNTER — Inpatient Hospital Stay (HOSPITAL_COMMUNITY): Payer: BC Managed Care – PPO

## 2019-09-19 ENCOUNTER — Inpatient Hospital Stay (HOSPITAL_COMMUNITY): Payer: BC Managed Care – PPO | Admitting: Occupational Therapy

## 2019-09-19 ENCOUNTER — Inpatient Hospital Stay (HOSPITAL_COMMUNITY): Payer: BC Managed Care – PPO | Admitting: Physical Therapy

## 2019-09-19 LAB — CREATININE, SERUM
Creatinine, Ser: 0.78 mg/dL (ref 0.44–1.00)
GFR calc Af Amer: >60 mL/min (ref >60)
GFR calc non Af Amer: >60 mL/min (ref >60)

## 2019-09-19 NOTE — Plan of Care (Signed)
Problem: Consults Goal: RH GENERAL PATIENT EDUCATION Description: See Patient Education module for education specifics. Outcome: Progressing  Problem: RH BOWEL ELIMINATION Goal: RH STG MANAGE BOWEL WITH ASSISTANCE Description: STG Manage Bowel with min Assistance. Outcome: Progressing Goal: RH STG MANAGE BOWEL W/MEDICATION W/ASSISTANCE Description: STG Manage Bowel with Medication with min Assistance. Outcome: Progressing   Problem: RH BLADDER ELIMINATION Goal: RH STG MANAGE BLADDER WITH ASSISTANCE Description: STG Manage Bladder With min Assistance Outcome: Progressing Goal: RH STG MANAGE BLADDER WITH EQUIPMENT WITH ASSISTANCE Description: STG Manage Bladder With Equipment With min Assistance Outcome: Progressing   Problem: RH SKIN INTEGRITY Goal: RH STG SKIN FREE OF INFECTION/BREAKDOWN Description: Skin will be free of infection/breakdown with min assist Outcome: Progressing  Problem: RH SAFETY Goal: RH STG ADHERE TO SAFETY PRECAUTIONS W/ASSISTANCE/DEVICE Description: STG Adhere to Safety Precautions With min Assistance/Device. Outcome: Progressing   Problem: RH PAIN MANAGEMENT Goal: RH STG PAIN MANAGED AT OR BELOW PT'S PAIN GOAL Description: Pain will be managed at or below a 3 out of 10 on pain scale with min assist Outcome: Progressing   Problem: RH KNOWLEDGE DEFICIT GENERAL Goal: RH STG INCREASE KNOWLEDGE OF SELF CARE AFTER HOSPITALIZATION Description: Pt will be able to verbalize and preform self care prior to discharge with min assist  Outcome: Progressing

## 2019-09-19 NOTE — Telephone Encounter (Signed)
PA request received from [facility]  Drug requested: Spiriva Respimat 2.5 CMM Key: [id number] Tried/failed: on file Covered alternatives:  PA request has been sent to plan, and a determination is expected within 3 days.   Routing to [place] for follow-up.

## 2019-09-19 NOTE — Progress Notes (Signed)
Physical Therapy Session Note  Patient Details  Name: Julia Daniels MRN: [id number] Date of Birth: 08/14/1950  Today's Date: 09/19/2019 PT Individual Time: 0903-1015 PT Individual Time Calculation (min): 72 min   Short Term Goals: Week 1:  PT Short Term Goal 1 (Week 1): Pt will perform supine<>sit with min assist, not using bed features PT Short Term Goal 2 (Week 1): Pt will perform stand pivot transfers with supervision PT Short Term Goal 3 (Week 1): Pt will ambulate at least 27ft using LRAD with supervision PT Short Term Goal 4 (Week 1): Pt will ascend/descend 4 steps using R HR only with CGA  Skilled Therapeutic Interventions/Progress Updates:    pt received in bed and agreeable to therapy. Pt directed in supine>sit with HOB elevated and (+) use of bed rails supervision with log roll technique; pt directed in STS from EOB supervision; gait training with RW 10' to [facility] in room CGA and Pt taken to gym in [facility] total A for time management. Pt directed in ascending and descending x3 stairs x2 with R handrail only at [place] with VC for pt to manage O2 tubing and position it to decrease fall risk, breathing technique and energy conservation with reaching top of stairs and rest break at bottom of stairs required, unsafe for pt to attempt to manage O2 tank on stairs due to balance, strength levels and endurance. Pt understands and educated on benefit of portable, smaller tank use at home upon DC to improve pt's function and independence in community. Pt also educated on safety tips for energy conservation at home with sitting arrangements in home as needed, breathing technique with activity to improve conservation and O2 uptake, safety with O2 management with activity and walker use. Pt directed in gait training with rollator for 100' x4 with seated rest break at rollator at end distance each rep, grossly CGA. Pt educated on rollator safety with break use, pushing rollator to wall or support surface,  locking breaks then turning to sit, pt demonstrated understanding and good recall with techinque with each rest break. Pt directed in x5 STS to rollator supervision, returned to room in [facility] for time and directed in gait in room to bedside, CGA no AD 10' transferred to sitting then supine CGA. Min A for upward scooting in bed. Pt left in bed, alarm set, All needs in reach and in good condition. Call light in hand.     Therapy Documentation Precautions:  Precautions Precautions: Fall Precaution Comments: new T6 comp Fx and COPD (home O2 at night/PRN day) Required Braces or Orthoses: Spinal Brace Spinal Brace: Thoracolumbosacral orthotic, Applied in sitting position Restrictions Weight Bearing Restrictions: No Other Position/Activity Restrictions: Don/doff EOB for self teaching   Therapy/Group: Individual Therapy  [name] 09/19/2019, 10:48 AM

## 2019-09-19 NOTE — Progress Notes (Signed)
Occupational Therapy Session Note  Patient Details  Name: Julia Daniels MRN: [id number] Date of Birth: [birth year]  Today's Date: 09/19/2019 OT Individual Time: 9791-5041 OT Individual Time Calculation (min): 58 min   Skilled Therapeutic Interventions/Progress Updates:    Pt greeted in bed and premedicated for pain, on 2L 02 at rest. She requested to start session by using the restroom. Supine<sit completed with setup assistance with pt mindful of her back precautions. Assistance required for donning her TLSO EOB before ambulating with CGA to the toilet using RW. Pt with continent bladder void, CGA for toileting tasks overall. Transitioned to the shower chair where pt bathed while seated, using leans for perihygiene and LH sponge to reach back and feet. OT washed her hair which visibly brightened pts affect. Dressing was completed sitting on shower chair sit<stand with RW. Pt able to obtain seated figure 4 however this increases her resting back pain. Therefore reacher used for pain mgt while threading LEs into LB garments. CGA for elevating pants over hips before ambulating back to bed. Mod A for transitioned to side-lying. Pt remained in bed at end of session, lying on her k-pad. Left her with all needs within reach and bed alarm set. Tx focus placed on functional ambulation, dynamic balance, adaptive self care skills, and AE training.   02 sats throughout session on 2L remained between 94-97%, door kept ajar during shower to increase ventilation.   Therapy Documentation Precautions:  Precautions Precautions: Fall Precaution Comments: new T6 comp Fx and COPD (home O2 at night/PRN day) Required Braces or Orthoses: Spinal Brace Spinal Brace: Thoracolumbosacral orthotic, Applied in sitting position Restrictions Weight Bearing Restrictions: No Other Position/Activity Restrictions: Don/doff EOB for self teaching ADL: ADL Eating: Modified independent Grooming: Setup Where  Assessed-Grooming: Sitting at sink Upper Body Bathing: Minimal assistance Where Assessed-Upper Body Bathing: Wheelchair, Sitting at sink Lower Body Bathing: Dependent Where Assessed-Lower Body Bathing: Wheelchair Upper Body Dressing: Maximal assistance Where Assessed-Upper Body Dressing: Sitting at sink, Wheelchair Lower Body Dressing: Dependent Where Assessed-Lower Body Dressing: Sitting at sink, Wheelchair      Therapy/Group: Individual Therapy  Nason Conradt A Seab Axel 09/19/2019, 12:43 PM

## 2019-09-19 NOTE — Progress Notes (Signed)
Occupational Therapy Session Note  Patient Details  Name: Julia Daniels MRN: [id number] Date of Birth: 02-01-1950  Today's Date: 09/19/2019 OT Individual Time: 1330-1430 OT Individual Time Calculation (min): 60 min    Short Term Goals: Week 2:  OT Short Term Goal 1 (Week 2): Pt will perform funcitonal task for 3-5 min prior to needing rest break to demo improved activity tolerance OT Short Term Goal 2 (Week 2): Pt will thread BLE into pants with S and AE PRN OT Short Term Goal 3 (Week 2): Pt will perform 3/3 toileting tasks with S with AE PRN OT Short Term Goal 4 (Week 2): Pt will perform LB bathing with S with AE PRN  Skilled Therapeutic Interventions/Progress Updates:    Pt resting in bed upon arrival and ready for therapy. OT intervention with focus on bed mobility, standing balance, functional amb with Rollator and RW, activity tolerance, and safety awareness. Pt amb with RW to ADL apartment and kitchen. Pt practiced using Rollator in bedroom and kitchen for simple home mgmt tasks and gathering supplies in kitchen.  Home setting with microwave above oven. Pt able to reach to simulated height of home microwave. Discussed kitchen safety. Pt with increased SOB and required multiple sitting and standing rest breaks primarily for breathing.  Pt on 2L O2 and sats>94% throughout session. Bed mobility-supine>sit and sit<>supine with supervision using bed rails.  Pt required assistance for repositioning in bed. Pt remained in bed with all needs within reach and bed alarm activated.  Pt stated she thought she might need a breathing treatment.  RN notified.   Therapy Documentation Precautions:  Precautions Precautions: Fall Precaution Comments: new T6 comp Fx and COPD (home O2 at night/PRN day) Required Braces or Orthoses: Spinal Brace Spinal Brace: Thoracolumbosacral orthotic, Applied in sitting position Restrictions Weight Bearing Restrictions: No Other Position/Activity Restrictions:  Don/doff EOB for self teaching Pain:  Pt c/o increased pain when sitting with TLSO donned (6/10); activity and repositioned   Therapy/Group: Individual Therapy  Leroy Libman 09/19/2019, 2:41 PM

## 2019-09-19 NOTE — Progress Notes (Signed)
PHYSICAL MEDICINE & REHABILITATION PROGRESS NOTE   Subjective/Complaints:   Had BM yesterday before due for Sorbitol.Marland Kitchen  Kpad helping back pain somewhat.   No other issues.   ROS:  Pt denies SOB, abd pain, CP, N/V/C/D, and vision changes   Objective:   No results found. No results for input(s): WBC, HGB, HCT, PLT in the last 72 hours. No results for input(s): NA, K, CL, CO2, GLUCOSE, BUN, CREATININE, CALCIUM in the last 72 hours.  Intake/Output Summary (Last 24 hours) at 09/19/2019 0907 Last data filed at 09/19/2019 0845 Gross per 24 hour  Intake 975 ml  Output --  Net 975 ml     Physical Exam: Vital Signs Blood pressure 140/81, pulse 76, temperature 98.4 F (36.9 C), resp. rate 16, height 5\' 3"  (1.6 m), weight 90.3 kg, SpO2 96 %. Physical Exam General: .sitting up in bed; appropriate, NAD HEENT: conjugate gaze; O2 is 1.5L- no change Heart: RRR Chest: CTA B/L- no W/R/R- good air movement; O2 by [place] 1.5L Abdomen: Soft, NT, ND, (+)BS  Extremities: No clubbing, cyanosis, or edema. Pulses are 2+ Skin: Left forearm edema and severe bruising of R forearm/hand- slowly healing Neuro: Ox3 Psych:bright affect     Assessment/Plan: 1. Functional deficits secondary to Debility due to COPD exacerbation, prolonged hospitalization  which require 3+ hours per day of interdisciplinary therapy in a comprehensive inpatient rehab setting.  Physiatrist is providing close team supervision and 24 hour management of active medical problems listed below.  Physiatrist and rehab team continue to assess barriers to discharge/monitor patient progress toward functional and medical goals  Care Tool:  Bathing    Body parts bathed by patient: Chest, Abdomen, Face, Right arm, Left arm, Right upper leg, Left upper leg   Body parts bathed by helper: Buttocks, Right lower leg, Left lower leg, Front perineal area     Bathing assist Assist Level: Moderate Assistance - Patient 50 -  74%     Upper Body Dressing/Undressing Upper body dressing   What is the patient wearing?: Pull over shirt, Orthosis Orthosis activity level: Performed by helper  Upper body assist Assist Level: Moderate Assistance - Patient 50 - 74%    Lower Body Dressing/Undressing Lower body dressing      What is the patient wearing?: Pants, Underwear/pull up     Lower body assist Assist for lower body dressing: Minimal Assistance - Patient > 75%     Toileting Toileting    Toileting assist Assist for toileting: Minimal Assistance - Patient > 75% Assistive Device Comment:  (BSC)   Transfers Chair/bed transfer  Transfers assist     Chair/bed transfer assist level: Supervision/Verbal cueing     Locomotion Ambulation   Ambulation assist      Assist level: Supervision/Verbal cueing Assistive device: Walker-rolling Max distance: 150 (3 standing rest breaks)   Walk 10 feet activity   Assist     Assist level: Supervision/Verbal cueing Assistive device: Walker-rolling   Walk 50 feet activity   Assist Walk 50 feet with 2 turns activity did not occur: Safety/medical concerns  Assist level: Supervision/Verbal cueing Assistive device: Walker-rolling    Walk 150 feet activity   Assist Walk 150 feet activity did not occur: Safety/medical concerns  Assist level: Supervision/Verbal cueing Assistive device: Walker-rolling    Walk 10 feet on uneven surface  activity   Assist Walk 10 feet on uneven surfaces activity did not occur: Safety/medical concerns         Wheelchair  Assist Will patient use wheelchair at discharge?: No             Wheelchair 50 feet with 2 turns activity    Assist            Wheelchair 150 feet activity     Assist          Blood pressure 140/81, pulse 76, temperature 98.4 F (36.9 C), resp. rate 16, height 5\' 3"  (1.6 m), weight 90.3 kg, SpO2 96 %.  Medical Problem List and Plan: 1.  Decreased  functional ability secondary to recurrent COPD exacerbation with compression fracture T6.  Back brace when out of bed..  Continue nebulizer treatments as directed.  Prednisone tapered             -patient may shower             -ELOS/Goals: modI 8-9 days  -Continue CIR 2.  Antithrombotics: -DVT/anticoagulation: Lovenox 9/8- needs until d/c from hospital- won't need at home             -antiplatelet therapy: N/A 3. Pain Management: Baclofen 10 mg 3 times daily, Lidoderm patch, hydrocodone as needed. Log roll to minimize back pain  9/5- will increase Norco to 5-10 mg q4 hours prn due to compression fx will hold off on Oxycodone- hopefully, due to sedation risks, won't have to go to Oxy.   9/6: Added ibuprofen 800mg  BID prn for breakthrough pain. Hgb anmd Cr normal  9/8- pain doing better, but till sometimes impacts therapy per OT and PT- will monitor  9/9- will move lidoderm patches to daytime 8am to 8pm to start in AM  9/10- says kpad helping pain, esp at night- will con't regimen 4. Mood: Lamictal 200 mg nightly, Effexor 882 mg daily Concerta 72 mg daily Xanax as needed. Has some anxiety.              -antipsychotic agents: Seroquel 150 mg nightly, Risperdal 3 mg nightly 5. Neuropsych: This patient is capable of making decisions on her own behalf. 6. Skin/Wound Care: Routine skin checks 7. Fluids/Electrolytes/Nutrition: Routine in and outs with follow-up chemistries. 8. Acute on chronic respiratory failure with hypoxia: continue to monitor saturations closely with therapy. Wean oxygen as tolerated. - Pt had prn O2 at home, but didn't wear full time.  9/5- still t 2L- weaning as tolerated   9/7- O2 down to 1.5L  9/8- continue to wean O2 as tolerated- will ask therapy to try 1L, if possible.   9/8- pt had episode of SOB/DOE yesterday and needed 2L O2- will wean AS TOLERATED  9/10- pt still on 1.5L- might need to go home on O2?- Says she has some at home prn.  9. Leukocytosis  9/5- likely due  to steroids- afebrile- no complaints- trending downward 9/6  9/7- WBC down to 12.1- doing better 10. Constipation  9/9- has been 3 days- if no BM by 1pm, ordered sorbitol   9/10- LBM yesterday BEFORE had required sorbitol  LOS: 7 days A FACE TO FACE EVALUATION WAS PERFORMED  Terre Zabriskie 09/19/2019, 9:07 AM

## 2019-09-20 ENCOUNTER — Inpatient Hospital Stay (HOSPITAL_COMMUNITY): Payer: BC Managed Care – PPO | Admitting: Physical Therapy

## 2019-09-20 ENCOUNTER — Inpatient Hospital Stay (HOSPITAL_COMMUNITY): Payer: BC Managed Care – PPO

## 2019-09-20 ENCOUNTER — Inpatient Hospital Stay (HOSPITAL_COMMUNITY): Payer: BC Managed Care – PPO | Admitting: Occupational Therapy

## 2019-09-20 DIAGNOSIS — M545 Low back pain: Secondary | ICD-10-CM

## 2019-09-20 DIAGNOSIS — F411 Generalized anxiety disorder: Secondary | ICD-10-CM

## 2019-09-20 DIAGNOSIS — G8929 Other chronic pain: Secondary | ICD-10-CM

## 2019-09-20 DIAGNOSIS — Z9981 Dependence on supplemental oxygen: Secondary | ICD-10-CM

## 2019-09-20 DIAGNOSIS — J441 Chronic obstructive pulmonary disease with (acute) exacerbation: Secondary | ICD-10-CM

## 2019-09-20 DIAGNOSIS — K5903 Drug induced constipation: Secondary | ICD-10-CM

## 2019-09-20 DIAGNOSIS — D72829 Elevated white blood cell count, unspecified: Secondary | ICD-10-CM

## 2019-09-20 NOTE — Progress Notes (Signed)
Occupational Therapy Session Note  Patient Details  Name: Julia Daniels MRN: [id number] Date of Birth: [birth year]  Today's Date: 09/20/2019 OT Group Time:  - 60 minutes missed    Skilled Therapeutic Interventions/Progress Updates:    Pt seen for scheduled group time and declined due to pain, nursing aware. 60 minutes missed.    Therapy Documentation Precautions:  Precautions Precautions: Fall Precaution Comments: new T6 comp Fx and COPD (home O2 at night/PRN day) Required Braces or Orthoses: Spinal Brace Spinal Brace: Thoracolumbosacral orthotic, Applied in sitting position Restrictions Weight Bearing Restrictions: No Other Position/Activity Restrictions: Don/doff EOB for self teaching Vital Signs: Therapy Vitals Temp: 99.1 F (37.3 C) Temp Source: Oral Pulse Rate: 95 Resp: 20 BP: (!) 141/80 Patient Position (if appropriate): Lying Oxygen Therapy SpO2: 96 % O2 Device: Nasal Cannula O2 Flow Rate (L/min): 2 L/min Pain: Pain Assessment Pain Scale: 0-10 Pain Score: 5  Pain Type: Chronic pain Pain Location: Back Pain Orientation: Mid Pain Descriptors / Indicators: Aching Pain Frequency: Intermittent Pain Onset: Awakened from sleep Pain Intervention(s): Medication (See eMAR) ADL: ADL Eating: Modified independent Grooming: Setup Where Assessed-Grooming: Sitting at sink Upper Body Bathing: Minimal assistance Where Assessed-Upper Body Bathing: Wheelchair, Sitting at sink Lower Body Bathing: Dependent Where Assessed-Lower Body Bathing: Wheelchair Upper Body Dressing: Maximal assistance Where Assessed-Upper Body Dressing: Sitting at sink, Wheelchair Lower Body Dressing: Dependent Where Assessed-Lower Body Dressing: Sitting at sink, Wheelchair     Therapy/Group: Group Therapy  Rondarius Kadrmas A Tekelia Kareem 09/20/2019, 7:20 AM

## 2019-09-20 NOTE — Progress Notes (Signed)
Physical Therapy Session Note  Patient Details  Name: Julia Daniels MRN: [id number] Date of Birth: 17-Mar-1950  Today's Date: 09/20/2019 PT Individual Time: 1510-1600 PT Individual Time Calculation (min): 50 min   Short Term Goals: Week 1:  PT Short Term Goal 1 (Week 1): Pt will perform supine<>sit with min assist, not using bed features PT Short Term Goal 2 (Week 1): Pt will perform stand pivot transfers with supervision PT Short Term Goal 3 (Week 1): Pt will ambulate at least 62ft using LRAD with supervision PT Short Term Goal 4 (Week 1): Pt will ascend/descend 4 steps using R HR only with CGA  Skilled Therapeutic Interventions/Progress Updates:  Pt resting in bed.  On 2 L O2 via [place]; O2 sats 96% after activity   Pt very upset because of timing of pain meds; she does not receive them until right before therapy sessions  And is unable to participate fully.  Pt unable to attempt standing and ambulating this session due to pain level,  which increases with movement.  Pain rated 6/10 mid back; premedicated at 1325, per pt.  PT recommended that she discuss the possibility of scheduled pain meds with MD.  Therapeutic exercise performed with LEs to increase strength for functional mobility. Supine wit HOB raises- 1 x 15 R/L straight leg raises, bil hip abduction, bil hip internal rotation, bil adductor squeezes,R/L short arc quad with 2# ankle weights.  25 x 1 alternating ankle pumps. Pt rated 7/10 after exs.  Using bed features and UEs on railings, pt scooted to [facility] with min assist.  At end of session, pt in bed, resting with needs at hand and bed alarm set.     Therapy Documentation Precautions:  Precautions Precautions: Fall Precaution Comments: new T6 comp Fx and COPD (home O2 at night/PRN day) Required Braces or Orthoses: Spinal Brace Spinal Brace: Thoracolumbosacral orthotic, Applied in sitting position Restrictions Weight Bearing Restrictions: No Other Position/Activity  Restrictions: Don/doff EOB for self teaching       Therapy/Group: Individual Therapy  Rosine Solecki 09/20/2019, 4:13 PM

## 2019-09-20 NOTE — Progress Notes (Signed)
Physical Therapy Session Note  Patient Details  Name: Julia Daniels MRN: [id number] Date of Birth: Jun 17, 1950  Today's Date: 09/20/2019 PT Individual Time: 0800-0900 PT Individual Time Calculation (min): 60 min  Missed PT time: 15 min.  Short Term Goals: Week 1:  PT Short Term Goal 1 (Week 1): Pt will perform supine<>sit with min assist, not using bed features PT Short Term Goal 2 (Week 1): Pt will perform stand pivot transfers with supervision PT Short Term Goal 3 (Week 1): Pt will ambulate at least 37ft using LRAD with supervision PT Short Term Goal 4 (Week 1): Pt will ascend/descend 4 steps using R HR only with CGA  Skilled Therapeutic Interventions/Progress Updates: P{t presents semi-reclined in bed and agreeable to therapy, but had increased pain last night and has just been decreased in last 2 hours to 6/10.  Pt performed rolling w/ supervision, then to sitting EOB using elevated HOB and siderails and CGA.  Pt required max A to don TLSO, although assists w/ all straps.  Pt transfers sit to stand w/ supervision.  Pt amb across room w/ RW to hallway to transfer to rollator.  Pt amb into hallway w/ CGA 140' before requiring sitting on rollator seat 2/2 pain.  Pt educated on need to place rollator against wall for safety.  Pt sat approx. 2' but then needed to stand d/t 10/10 pain to low/midback.  Pt spoke w/ RN re: pain and will be given when due.  Pt required multiple sit to stand transfers 2/2 pain.  O2 sats post-gait of 93% and HR 87 bpm on 4LPM.  Pt amb back to room w/ 1 seated rest break and multiple standing rest breaks 2/2 pain.  Pt required CGA during gait for safety.  Pt doffed TLSO w/ total A and the performed sit to sidelying and to supine w/ mod A for LEs.  Pt required total A to scoot to [facility] and then required supine rest break 2/2 pain w/ pain decreasing to 4/10.  Pt performed rolling side to side w/ supervision using siderails to straighten bed pads and sheets.  Pt performed LE  there ex to increase strength.  Pt performed AP, HS, abd/add 3 x 15 w/ verbal cueing for contralateral knee flexion w/ abd/add for improved comfort LB.  Pt remained in bed w/ all needs in reach and bed alarm on.     Therapy Documentation Precautions:  Precautions Precautions: Fall Precaution Comments: new T6 comp Fx and COPD (home O2 at night/PRN day) Required Braces or Orthoses: Spinal Brace Spinal Brace: Thoracolumbosacral orthotic, Applied in sitting position Restrictions Weight Bearing Restrictions: No Other Position/Activity Restrictions: Don/doff EOB for self teaching General:   Vital Signs: Oxygen Therapy SpO2: 96 % O2 Device: Nasal Cannula O2 Flow Rate (L/min): 2 L/min Pain: low back 6/10 initially, but increased to 10/10 w/ activity.Pt asked for pain meds from nursing, will bring when due. Pain Assessment Pain Scale: 0-10 Pain Score: 5  Pain Type: Chronic pain Pain Location: Back Pain Orientation: Mid Pain Descriptors / Indicators: Aching Pain Frequency: Intermittent Pain Onset: Awakened from sleep Pain Intervention(s): Medication (See eMAR) Mobility:   Locomotion :    Trunk/Postural Assessment :       Therapy/Group: Individual Therapy  Ladoris Gene 09/20/2019, 9:07 AM

## 2019-09-20 NOTE — Plan of Care (Signed)
  Problem: Consults Goal: RH GENERAL PATIENT EDUCATION Description: See Patient Education module for education specifics. Outcome: Progressing Goal: Skin Care Protocol Initiated - if Braden Score 18 or less Description: If consults are not indicated, leave blank or document N/A Outcome: Progressing Goal: Diabetes Guidelines if Diabetic/Glucose > 140 Description: If diabetic or lab glucose is > 140 mg/dl - Initiate Diabetes/Hyperglycemia Guidelines & Document Interventions  Outcome: Progressing   Problem: RH BOWEL ELIMINATION Goal: RH STG MANAGE BOWEL WITH ASSISTANCE Description: STG Manage Bowel with min Assistance. Outcome: Progressing Goal: RH STG MANAGE BOWEL W/MEDICATION W/ASSISTANCE Description: STG Manage Bowel with Medication with min Assistance. Outcome: Progressing   Problem: RH BLADDER ELIMINATION Goal: RH STG MANAGE BLADDER WITH ASSISTANCE Description: STG Manage Bladder With min Assistance Outcome: Progressing Goal: RH STG MANAGE BLADDER WITH EQUIPMENT WITH ASSISTANCE Description: STG Manage Bladder With Equipment With min Assistance Outcome: Progressing   Problem: RH SKIN INTEGRITY Goal: RH STG SKIN FREE OF INFECTION/BREAKDOWN Description: Skin will be free of infection/breakdown with min assist Outcome: Progressing Goal: RH STG ABLE TO PERFORM INCISION/WOUND CARE W/ASSISTANCE Description: STG Able To Perform Incision/Wound Care With min Assistance. Outcome: Progressing   Problem: RH SAFETY Goal: RH STG ADHERE TO SAFETY PRECAUTIONS W/ASSISTANCE/DEVICE Description: STG Adhere to Safety Precautions With min Assistance/Device. Outcome: Progressing   Problem: RH PAIN MANAGEMENT Goal: RH STG PAIN MANAGED AT OR BELOW PT'S PAIN GOAL Description: Pain will be managed at or below a 3 out of 10 on pain scale with min assist Outcome: Progressing   Problem: RH KNOWLEDGE DEFICIT GENERAL Goal: RH STG INCREASE KNOWLEDGE OF SELF CARE AFTER HOSPITALIZATION Description: Pt  will be able to verbalize and preform self care prior to discharge with min assist  Outcome: Progressing

## 2019-09-20 NOTE — Progress Notes (Signed)
[facility] PHYSICAL MEDICINE & REHABILITATION PROGRESS NOTE   Subjective/Complaints: Patient seen sitting up in bed this morning.  He states she slept well overnight, however had some pain this morning because she did not take pain medications overnight.  Pain improved with meds.    ROS: + Shortness of breath (baseline). Denies CP, N/V/C/D  Objective:   No results found. No results for input(s): WBC, HGB, HCT, PLT in the last 72 hours. Recent Labs    09/19/19 1042  CREATININE 0.78    Intake/Output Summary (Last 24 hours) at 09/20/2019 1156 Last data filed at 09/20/2019 0740 Gross per 24 hour  Intake 682 ml  Output --  Net 682 ml     Physical Exam: Vital Signs Blood pressure (!) 141/80, pulse 95, temperature 99.1 F (37.3 C), temperature source Oral, resp. rate 20, height 5\' 3"  (1.6 m), weight 90.3 kg, SpO2 96 %. Constitutional: No distress . Vital signs reviewed. HENT: Normocephalic.  Atraumatic. Eyes: EOMI. No discharge. Cardiovascular: No JVD.  RRR. Respiratory: Normal effort.  No stridor.  + Mild rhonchi.  + [place]. GI: Non-distended.  BS +. Skin: Warm and dry.  Scattered ecchymosis Psych: Normal mood.  Normal behavior. Musc: No edema in extremities.  No tenderness in extremities. Neuro: Alert Motor: 4/5 throughout  Assessment/Plan: 1. Functional deficits secondary to Debility due to COPD exacerbation, prolonged hospitalization  which require 3+ hours per day of interdisciplinary therapy in a comprehensive inpatient rehab setting.  Physiatrist is providing close team supervision and 24 hour management of active medical problems listed below.  Physiatrist and rehab team continue to assess barriers to discharge/monitor patient progress toward functional and medical goals  Care Tool:  Bathing    Body parts bathed by patient: Chest, Abdomen, Face, Right arm, Left arm, Right upper leg, Left upper leg, Front perineal area, Buttocks, Right lower leg, Left lower leg   Body  parts bathed by helper: Buttocks, Right lower leg, Left lower leg, Front perineal area     Bathing assist Assist Level: Contact Guard/Touching assist     Upper Body Dressing/Undressing Upper body dressing   What is the patient wearing?: Pull over shirt, Orthosis Orthosis activity level: Performed by helper  Upper body assist Assist Level: Moderate Assistance - Patient 50 - 74%    Lower Body Dressing/Undressing Lower body dressing      What is the patient wearing?: Pants, Underwear/pull up     Lower body assist Assist for lower body dressing: Contact Guard/Touching assist     Toileting Toileting    Toileting assist Assist for toileting: Contact Guard/Touching assist Assistive Device Comment:  (BSC)   Transfers Chair/bed transfer  Transfers assist     Chair/bed transfer assist level: Supervision/Verbal cueing     Locomotion Ambulation   Ambulation assist      Assist level: Contact Guard/Touching assist Assistive device: Walker-rolling Max distance: 140'   Walk 10 feet activity   Assist     Assist level: Contact Guard/Touching assist Assistive device: Walker-rolling   Walk 50 feet activity   Assist Walk 50 feet with 2 turns activity did not occur: Safety/medical concerns  Assist level: Contact Guard/Touching assist Assistive device: Walker-rolling    Walk 150 feet activity   Assist Walk 150 feet activity did not occur: Safety/medical concerns  Assist level: Supervision/Verbal cueing Assistive device: Walker-rolling    Walk 10 feet on uneven surface  activity   Assist Walk 10 feet on uneven surfaces activity did not occur: Safety/medical concerns  Wheelchair     Assist Will patient use wheelchair at discharge?: No             Wheelchair 50 feet with 2 turns activity    Assist            Wheelchair 150 feet activity     Assist          Blood pressure (!) 141/80, pulse 95, temperature 99.1 F  (37.3 C), temperature source Oral, resp. rate 20, height 5\' 3"  (1.6 m), weight 90.3 kg, SpO2 96 %.  Medical Problem List and Plan: 1.  Decreased functional ability secondary to recurrent COPD exacerbation with compression fracture T6.  Back brace when out of bed..  Continue nebulizer treatments as directed.  Prednisone tapered  Continue CIR 2.  Antithrombotics: -DVT/anticoagulation: Lovenox             -antiplatelet therapy: N/A 3. Pain Management: Baclofen 10 mg 3 times daily, Lidoderm patch, hydrocodone as needed. Log roll to minimize back pain  Increased Norco to 5-10 mg q4 hours prn due to compression   Added ibuprofen 800mg  BID prn for breakthrough pain. Hgb anmd Cr normal  lidoderm patches to daytime 8am to 8pm to start in AM  kpad helping pain, esp at night- will con't regimen  Controlled when she receives her medication on 9/11 4. Mood: Lamictal 200 mg nightly, Effexor 161 mg daily Concerta 72 mg daily Xanax as needed. Has some anxiety.              -antipsychotic agents: Seroquel 150 mg nightly, Risperdal 3 mg nightly  Stable on 9/11 5. Neuropsych: This patient is capable of making decisions on her own behalf. 6. Skin/Wound Care: Routine skin checks 7. Fluids/Electrolytes/Nutrition: Routine in and outs. 8. Acute on chronic respiratory failure with hypoxia: continue to monitor saturations closely with therapy.  Wean oxygen as tolerated. - Pt had prn O2 at home, but didn't wear full time.  Continue to wean supplemental oxygen as tolerated, may need to go home on supplemental O2 9. Leukocytosis  WBCs 12.1 on 9/6, labs ordered for Monday  Afebrile 10.  Drug-induced constipation  Overall improving  LOS: 8 days A FACE TO FACE EVALUATION WAS PERFORMED  Kialee Kham Lorie Phenix 09/20/2019, 11:56 AM

## 2019-09-21 NOTE — Progress Notes (Signed)
[place] PHYSICAL MEDICINE & REHABILITATION PROGRESS NOTE   Subjective/Complaints: Patient seen laying in bed this morning.  She states she slept much better overnight after waking up to take her pain medications.  ROS: + Shortness of breath (baseline).  Denies CP, N/V/C/D  Objective:   No results found. No results for input(s): WBC, HGB, HCT, PLT in the last 72 hours. Recent Labs    09/19/19 1042  CREATININE 0.78    Intake/Output Summary (Last 24 hours) at 09/21/2019 0829 Last data filed at 09/21/2019 1287 Gross per 24 hour  Intake 1562 ml  Output --  Net 1562 ml     Physical Exam: Vital Signs Blood pressure 135/74, pulse 79, temperature 98.2 F (36.8 C), resp. rate 18, height 5\' 3"  (1.6 m), weight 90.3 kg, SpO2 95 %. Constitutional: No distress . Vital signs reviewed. HENT: Normocephalic.  Atraumatic. Eyes: EOMI. No discharge. Cardiovascular: No JVD.  RRR. Respiratory: Normal effort.  No stridor.  Bilateral clear to auscultation.  + [place]. GI: Non-distended.  BS +. Skin: Warm and dry.  Intact.  Scattered ecchymosis. Psych: Normal mood.  Normal behavior. Musc: No edema in extremities.  No tenderness in extremities. Neuro: Alert Motor: 4/5 throughout, unchanged  Assessment/Plan: 1. Functional deficits secondary to Debility due to COPD exacerbation, prolonged hospitalization  which require 3+ hours per day of interdisciplinary therapy in a comprehensive inpatient rehab setting.  Physiatrist is providing close team supervision and 24 hour management of active medical problems listed below.  Physiatrist and rehab team continue to assess barriers to discharge/monitor patient progress toward functional and medical goals  Care Tool:  Bathing    Body parts bathed by patient: Chest, Abdomen, Face, Right arm, Left arm, Right upper leg, Left upper leg, Front perineal area, Buttocks, Right lower leg, Left lower leg   Body parts bathed by helper: Buttocks, Right lower leg,  Left lower leg, Front perineal area     Bathing assist Assist Level: Contact Guard/Touching assist     Upper Body Dressing/Undressing Upper body dressing   What is the patient wearing?: Pull over shirt, Orthosis Orthosis activity level: Performed by helper  Upper body assist Assist Level: Moderate Assistance - Patient 50 - 74%    Lower Body Dressing/Undressing Lower body dressing      What is the patient wearing?: Pants, Underwear/pull up     Lower body assist Assist for lower body dressing: Contact Guard/Touching assist     Toileting Toileting    Toileting assist Assist for toileting: Contact Guard/Touching assist Assistive Device Comment:  (BSC)   Transfers Chair/bed transfer  Transfers assist     Chair/bed transfer assist level: Supervision/Verbal cueing     Locomotion Ambulation   Ambulation assist      Assist level: Contact Guard/Touching assist Assistive device: Walker-rolling Max distance: 140'   Walk 10 feet activity   Assist     Assist level: Contact Guard/Touching assist Assistive device: Walker-rolling   Walk 50 feet activity   Assist Walk 50 feet with 2 turns activity did not occur: Safety/medical concerns  Assist level: Contact Guard/Touching assist Assistive device: Walker-rolling    Walk 150 feet activity   Assist Walk 150 feet activity did not occur: Safety/medical concerns  Assist level: Supervision/Verbal cueing Assistive device: Walker-rolling    Walk 10 feet on uneven surface  activity   Assist Walk 10 feet on uneven surfaces activity did not occur: Safety/medical concerns         Wheelchair     Assist  Will patient use wheelchair at discharge?: No             Wheelchair 50 feet with 2 turns activity    Assist            Wheelchair 150 feet activity     Assist          Blood pressure 135/74, pulse 79, temperature 98.2 F (36.8 C), resp. rate 18, height 5\' 3"  (1.6 m), weight  90.3 kg, SpO2 95 %.  Medical Problem List and Plan: 1.  Decreased functional ability secondary to recurrent COPD exacerbation with compression fracture T6.  Back brace when out of bed..  Continue nebulizer treatments as directed.  Prednisone tapered  Continue CIR 2.  Antithrombotics: -DVT/anticoagulation: Lovenox             -antiplatelet therapy: N/A 3. Pain Management: Baclofen 10 mg 3 times daily, Lidoderm patch, hydrocodone as needed. Log roll to minimize back pain  Increased Norco to 5-10 mg q4 hours prn due to compression   Added ibuprofen 800mg  BID prn for breakthrough pain. Hgb anmd Cr normal  lidoderm patches to daytime 8am to 8pm to start in AM  kpad helping pain, esp at night- will con't regimen  Controlled when she receives her medication on 9/12 4. Mood: Lamictal 200 mg nightly, Effexor 536 mg daily Concerta 72 mg daily Xanax as needed. Has some anxiety.              -antipsychotic agents: Seroquel 150 mg nightly, Risperdal 3 mg nightly  Stable on 9/12 5. Neuropsych: This patient is capable of making decisions on her own behalf. 6. Skin/Wound Care: Routine skin checks 7. Fluids/Electrolytes/Nutrition: Routine in and outs. 8. Acute on chronic respiratory failure with hypoxia: continue to monitor saturations closely with therapy.  Wean oxygen as tolerated. - Pt had prn O2 at home, but didn't wear full time.  Continue to wean supplemental oxygen as tolerated, may need to go home on supplemental O2 9. Leukocytosis  WBCs 12.1 on 9/6, labs ordered for tomorrow  Afebrile 10.  Drug-induced constipation  Overall improving  LOS: 9 days A FACE TO FACE EVALUATION WAS PERFORMED  Lizbet Cirrincione Lorie Phenix 09/21/2019, 8:29 AM

## 2019-09-22 ENCOUNTER — Inpatient Hospital Stay (HOSPITAL_COMMUNITY): Payer: BC Managed Care – PPO | Admitting: Occupational Therapy

## 2019-09-22 ENCOUNTER — Inpatient Hospital Stay (HOSPITAL_COMMUNITY): Payer: BC Managed Care – PPO

## 2019-09-22 ENCOUNTER — Inpatient Hospital Stay (HOSPITAL_COMMUNITY): Payer: BC Managed Care – PPO | Admitting: Physical Therapy

## 2019-09-22 LAB — CBC WITH DIFFERENTIAL/PLATELET
Abs Immature Granulocytes: 0.07 10*3/uL (ref 0.00–0.07)
Basophils Absolute: 0 10*3/uL (ref 0.0–0.1)
Basophils Relative: 0 %
Eosinophils Absolute: 0 10*3/uL (ref 0.0–0.5)
Eosinophils Relative: 0 %
HCT: 47.3 % — ABNORMAL HIGH (ref 36.0–46.0)
Hemoglobin: 14.6 g/dL (ref 12.0–15.0)
Immature Granulocytes: 1 %
Lymphocytes Relative: 22 %
Lymphs Abs: 1.6 10*3/uL (ref 0.7–4.0)
MCH: 31.9 pg (ref 26.0–34.0)
MCHC: 30.9 g/dL (ref 30.0–36.0)
MCV: 103.5 fL — ABNORMAL HIGH (ref 80.0–100.0)
Monocytes Absolute: 0.6 10*3/uL (ref 0.1–1.0)
Monocytes Relative: 8 %
Neutro Abs: 4.9 10*3/uL (ref 1.7–7.7)
Neutrophils Relative %: 69 %
Platelets: 166 10*3/uL (ref 150–400)
RBC: 4.57 MIL/uL (ref 3.87–5.11)
RDW: 13.5 % (ref 11.5–15.5)
WBC: 7.2 10*3/uL (ref 4.0–10.5)
nRBC: 0 % (ref 0.0–0.2)

## 2019-09-22 MED ORDER — HYDROCODONE-ACETAMINOPHEN 10-325 MG PO TABS
1.0000 | ORAL_TABLET | Freq: Every morning | ORAL | Status: DC
  Administered 2019-09-23: 1 via ORAL
  Filled 2019-09-22: qty 1

## 2019-09-22 NOTE — Progress Notes (Signed)
Occupational Therapy Session Note  Patient Details  Name: Julia Daniels MRN: [id number] Date of Birth: 09-30-1950  Today's Date: 09/22/2019 OT Individual Time: 1300-1330 OT Individual Time Calculation (min): 30 min  and Today's Date: 09/22/2019 OT Missed Time: 30 Minutes Missed Time Reason: Pain   Short Term Goals: Week 2:  OT Short Term Goal 1 (Week 2): Pt will perform funcitonal task for 3-5 min prior to needing rest break to demo improved activity tolerance OT Short Term Goal 2 (Week 2): Pt will thread BLE into pants with S and AE PRN OT Short Term Goal 3 (Week 2): Pt will perform 3/3 toileting tasks with S with AE PRN OT Short Term Goal 4 (Week 2): Pt will perform LB bathing with S with AE PRN  Skilled Therapeutic Interventions/Progress Updates:    Pt resting in bed upon arrival with son present. Pt stated her back pain was improved compared to earlier in morning. Supine>sit EOB with supervision and no increase in pain.  Pt required min A for donning orthosis. Sit<>stand with CGA. Pt amb with RW into hallway towards nursing station.  Pt with increased pain during amb and stated that she couldn't walk any further.  Pt with notable facial grimacing. Pt returned to room via w/c and amb with RW to side of bed.  Pt required mod A for sit>supine in bed.  Pt commented that pain eased off when supine but was still at a level 7. Pt remained in bed with all needs within reach and bed alarm activated.   Therapy Documentation Precautions:  Precautions Precautions: Fall Precaution Comments: new T6 comp Fx and COPD (home O2 at night/PRN day) Required Braces or Orthoses: Spinal Brace Spinal Brace: Thoracolumbosacral orthotic, Applied in sitting position Restrictions Weight Bearing Restrictions: No Other Position/Activity Restrictions: Don/doff EOB for self teaching General: General OT Amount of Missed Time: 30 Minutes Pain: Pain Assessment Pain Scale: 0-10 Pain Score: 9  Pain Type:  Chronic pain Pain Location: Back Pain Orientation: Mid;Lower Pain Descriptors / Indicators: Aching Pain Frequency: Constant Pain Onset: On-going Patients Stated Pain Goal: 2 Pain Intervention(s): Meds admin prior to therapy, repositioned  Therapy/Group: Individual Therapy  Leroy Libman 09/22/2019, 1:59 PM

## 2019-09-22 NOTE — Progress Notes (Signed)
Occupational Therapy Session Note  Patient Details  Name: Julia Daniels MRN: [id number] Date of Birth: 11-18-1950  Today's Date: 09/22/2019 OT Individual Time: 9357-0177 OT Individual Time Calculation (min): 61 min  OT missed time: 14 mins  Short Term Goals: Week 2:  OT Short Term Goal 1 (Week 2): Pt will perform funcitonal task for 3-5 min prior to needing rest break to demo improved activity tolerance OT Short Term Goal 2 (Week 2): Pt will thread BLE into pants with S and AE PRN OT Short Term Goal 3 (Week 2): Pt will perform 3/3 toileting tasks with S with AE PRN OT Short Term Goal 4 (Week 2): Pt will perform LB bathing with S with AE PRN  Skilled Therapeutic Interventions/Progress Updates:    Pt greeted at time of session supine in bed resting comfortably, minimal c/o back pain. Agreeable to shower, wanting to see how her back pain is before ambulating to shower. Supine to sit EOB CGA with good form and log rolling for precautions. Donned brace Mod A, discussed potential for son to assist with hard to reach straps at home. Pt began to have pain sitting EOB, no #, sit to stand to relieve pain with lateral weight shifting and marching in place but pain continued to worsen, returned to sitting and quickly returned to supine Mod A, pt stating she had to lay back down d/t pain which was now 10/10, RN made aware and discussed with pt she is not due for pain meds. Attempting to make pt comfortable at bed level with side lying and heating pad for low back, rolled L and R with Supervision. Discussed positioning at home for comfort at bed level. Attempted to take pt off O2 tio wean off but d/t pain, O2 sats dropped to 90% and O2 reapplied at 2L. Educated on pursed lip breathing and relaxing to decrease pain. Pt with alarm on, call bell in reach. Missed 14 mins d/t pain.   Therapy Documentation Precautions:  Precautions Precautions: Fall Precaution Comments: new T6 comp Fx and COPD (home O2 at  night/PRN day) Required Braces or Orthoses: Spinal Brace Spinal Brace: Thoracolumbosacral orthotic, Applied in sitting position Restrictions Weight Bearing Restrictions: No Other Position/Activity Restrictions: Don/doff EOB for self teaching     Therapy/Group: Individual Therapy  Viona Gilmore 09/22/2019, 8:34 AM

## 2019-09-22 NOTE — Progress Notes (Signed)
[facility] PHYSICAL MEDICINE & REHABILITATION PROGRESS NOTE   Subjective/Complaints:  Pt would like to schedule her pain meds around the clock- explained that I cannot do that- hospital doesn't like it  As well as as needs them less, won't need them as often- and when goes home, has to get from PCP- would call them to see if they would even prescribe at current dosage/rate.    ROS: (+) SOB- since admission;   Pt deniesabd pain, CP, N/V/C/D, and vision changes   Objective:   No results found. No results for input(s): WBC, HGB, HCT, PLT in the last 72 hours. Recent Labs    09/19/19 1042  CREATININE 0.78    Intake/Output Summary (Last 24 hours) at 09/22/2019 0843 Last data filed at 09/21/2019 2023 Gross per 24 hour  Intake 720 ml  Output --  Net 720 ml     Physical Exam: Vital Signs Blood pressure 103/84, pulse 77, temperature 98.7 F (37.1 C), resp. rate 16, height 5\' 3"  (1.6 m), weight 90.3 kg, SpO2 96 %. Constitutional: No distress . Laying supine in bed- OT in room, NAD HENT: Normocephalic.  Atraumatic. Eyes: conjugate gaze- On 2L O2 by [place]. Cardiovascular: RRR Respiratory: O2 by  (+)- 2L; good air movement- no W/R/R GI: Soft, NT, ND, (+)BS  Skin: Warm and dry.  Intact.  Scattered ecchymosis. Psych: Normal mood.  Normal behavior. Musc: No edema in extremities.  No tenderness in extremities. Neuro: Alert Motor: 4/5 throughout, unchanged  Assessment/Plan: 1. Functional deficits secondary to Debility due to COPD exacerbation, prolonged hospitalization  which require 3+ hours per day of interdisciplinary therapy in a comprehensive inpatient rehab setting.  Physiatrist is providing close team supervision and 24 hour management of active medical problems listed below.  Physiatrist and rehab team continue to assess barriers to discharge/monitor patient progress toward functional and medical goals  Care Tool:  Bathing    Body parts bathed by patient: Chest, Abdomen,  Face, Right arm, Left arm, Right upper leg, Left upper leg, Front perineal area, Buttocks, Right lower leg, Left lower leg   Body parts bathed by helper: Buttocks, Right lower leg, Left lower leg, Front perineal area     Bathing assist Assist Level: Contact Guard/Touching assist     Upper Body Dressing/Undressing Upper body dressing   What is the patient wearing?: Orthosis Orthosis activity level: Performed by helper  Upper body assist Assist Level: Moderate Assistance - Patient 50 - 74%    Lower Body Dressing/Undressing Lower body dressing      What is the patient wearing?: Pants, Underwear/pull up     Lower body assist Assist for lower body dressing: Contact Guard/Touching assist     Toileting Toileting    Toileting assist Assist for toileting: Contact Guard/Touching assist Assistive Device Comment:  (BSC)   Transfers Chair/bed transfer  Transfers assist     Chair/bed transfer assist level: Supervision/Verbal cueing     Locomotion Ambulation   Ambulation assist      Assist level: Contact Guard/Touching assist Assistive device: Walker-rolling Max distance: 140'   Walk 10 feet activity   Assist     Assist level: Contact Guard/Touching assist Assistive device: Walker-rolling   Walk 50 feet activity   Assist Walk 50 feet with 2 turns activity did not occur: Safety/medical concerns  Assist level: Contact Guard/Touching assist Assistive device: Walker-rolling    Walk 150 feet activity   Assist Walk 150 feet activity did not occur: Safety/medical concerns  Assist level: Supervision/Verbal cueing  Assistive device: Walker-rolling    Walk 10 feet on uneven surface  activity   Assist Walk 10 feet on uneven surfaces activity did not occur: Safety/medical concerns         Wheelchair     Assist Will patient use wheelchair at discharge?: No             Wheelchair 50 feet with 2 turns activity    Assist             Wheelchair 150 feet activity     Assist          Blood pressure 103/84, pulse 77, temperature 98.7 F (37.1 C), resp. rate 16, height 5\' 3"  (1.6 m), weight 90.3 kg, SpO2 96 %.  Medical Problem List and Plan: 1.  Decreased functional ability secondary to recurrent COPD exacerbation with compression fracture T6.  Back brace when out of bed..  Continue nebulizer treatments as directed.  Prednisone tapered  Continue CIR 2.  Antithrombotics: -DVT/anticoagulation: Lovenox             -antiplatelet therapy: N/A 3. Pain Management: Baclofen 10 mg 3 times daily, Lidoderm patch, hydrocodone as needed. Log roll to minimize back pain  Increased Norco to 5-10 mg q4 hours prn due to compression   Added ibuprofen 800mg  BID prn for breakthrough pain. Hgb anmd Cr normal  lidoderm patches to daytime 8am to 8pm to start in AM  kpad helping pain, esp at night- will con't regimen  Controlled when she receives her medication on 9/12  9/13- pt asked for me to schedule her 6am pain meds- so that way is ready for therapy in AM- done- also explained will need to check with PCP if they will order pain meds at home.  4. Mood: Lamictal 200 mg nightly, Effexor 863 mg daily Concerta 72 mg daily Xanax as needed. Has some anxiety.              -antipsychotic agents: Seroquel 150 mg nightly, Risperdal 3 mg nightly  Stable on 9/12 5. Neuropsych: This patient is capable of making decisions on her own behalf. 6. Skin/Wound Care: Routine skin checks 7. Fluids/Electrolytes/Nutrition: Routine in and outs. 8. Acute on chronic respiratory failure with hypoxia: continue to monitor saturations closely with therapy.  Wean oxygen as tolerated. - Pt had prn O2 at home, but didn't wear full time.  Continue to wean supplemental oxygen as tolerated, may need to go home on supplemental O2  9/13- hasn't been able to wean at ALL from 2L O2- was on at home prior- spoke with OT about checking if she needs it with exercise.  9.  Leukocytosis  WBCs 12.1 on 9/6, labs ordered for tomorrow  Afebrile  9/13- labs ordered for tomorrow 10.  Drug-induced constipation  Overall improving  LOS: 10 days A FACE TO FACE EVALUATION WAS PERFORMED  Shalena Ezzell 09/22/2019, 8:43 AM

## 2019-09-22 NOTE — Progress Notes (Signed)
Physical Therapy Weekly Progress Note  Patient Details  Name: CHRYSTLE MURILLO MRN: [id number] Date of Birth: 12-Sep-1950  Beginning of progress report period: September 13, 2019 End of progress report period: September 22, 2019  Today's Date: 09/22/2019 PT Individual Time: 1045-1130 PT Individual Time Calculation (min): 45 min   Patient has met 3 of 4 short term goals.  Pt is making slow but steady progress towards therapy goals. Pt has been very limited at times due to ongoing severe pain in her back at site of injury. Pt exhibits good motivation and participation in therapy sessions as able but has been unable to functionally fully participate at times due to pain. Pt can require Supervision to mod A for bed mobility with use of bedrail depending on pain level, is overall Supervision for sit to stand and transfers with RW, is Supervision for gait up to 120 ft with RW, and is CGA for stairs with R handrail. Pt may require an extended LOS from originally planned d/c date in order to meet therapy goals of Supervision to mod I. Pt also currently requires 2L O2 via nasal cannula to keep SpO2 at 90% and above with activity.  Patient continues to demonstrate the following deficits muscle weakness, decreased cardiorespiratoy endurance and decreased oxygen support and decreased standing balance, decreased postural control, decreased balance strategies and difficulty maintaining precautions and therefore will continue to benefit from skilled PT intervention to increase functional independence with mobility.  Patient progressing toward long term goals..  Continue plan of care.  PT Short Term Goals Week 1:  PT Short Term Goal 1 (Week 1): Pt will perform supine<>sit with min assist, not using bed features PT Short Term Goal 1 - Progress (Week 1): Progressing toward goal PT Short Term Goal 2 (Week 1): Pt will perform stand pivot transfers with supervision PT Short Term Goal 2 - Progress (Week 1): Met PT  Short Term Goal 3 (Week 1): Pt will ambulate at least 54f using LRAD with supervision PT Short Term Goal 3 - Progress (Week 1): Met PT Short Term Goal 4 (Week 1): Pt will ascend/descend 4 steps using R HR only with CGA PT Short Term Goal 4 - Progress (Week 1): Met Week 2:  PT Short Term Goal 1 (Week 2): =LTG due to ELOS  Skilled Therapeutic Interventions/Progress Updates:    Pt received supine in bed, agreeable to PT session. Pt reports 9/10 pain in low back at rest that increases with mobility. Nursing able to provide pain medication during session and intermittent use of kpad for pain relief. Supine to sit at Supervision level with use of bedrail and HOB slightly elevated. Pt able to don TLSO while seated EOB with min A and cues for correct technique. Pt on 2L O2 at rest, SpO2 at 94%. Decreased to 1L O2 to trial with activity. Sit to stand with Supervision to RW. Ambulation 2 x 100 ft with RW at Supervision level. SpO2 drops to 86% while on 1L O2 during activity and requires extended standing rest break and pursed lip breathing to reach 89%. Increased supplemental O2 back up to 2L. Pt has increase in back pain with mobility and requests to return to bed. Sit to supine Supervision with use of bedrail, improved ability to maneuver LE in/out of bed this date. Pt requires extended rest break in semi-reclined position due to back pain. Semi-reclined BLE strengthening therex: heel slides, hip abd, SAQ x 15 reps each. Pt left semi-reclined in bed with needs in  reach at end of session.  Therapy Documentation Precautions:  Precautions Precautions: Fall Precaution Comments: new T6 comp Fx and COPD (home O2 at night/PRN day) Required Braces or Orthoses: Spinal Brace Spinal Brace: Thoracolumbosacral orthotic, Applied in sitting position Restrictions Weight Bearing Restrictions: No Other Position/Activity Restrictions: Don/doff EOB for self teaching   Therapy/Group: Individual Therapy   Excell Seltzer,  PT, DPT 09/22/2019, 12:30 PM

## 2019-09-23 ENCOUNTER — Inpatient Hospital Stay (HOSPITAL_COMMUNITY): Payer: BC Managed Care – PPO

## 2019-09-23 ENCOUNTER — Inpatient Hospital Stay (HOSPITAL_COMMUNITY): Payer: BC Managed Care – PPO | Admitting: Physical Therapy

## 2019-09-23 ENCOUNTER — Inpatient Hospital Stay (HOSPITAL_COMMUNITY): Payer: BC Managed Care – PPO | Admitting: Occupational Therapy

## 2019-09-23 LAB — BASIC METABOLIC PANEL
Anion gap: 11 (ref 5–15)
BUN: 8 mg/dL (ref 8–23)
CO2: 26 mmol/L (ref 22–32)
Calcium: 9.8 mg/dL (ref 8.9–10.3)
Chloride: 99 mmol/L (ref 98–111)
Creatinine, Ser: 0.77 mg/dL (ref 0.44–1.00)
GFR calc Af Amer: >60 mL/min (ref >60)
GFR calc non Af Amer: >60 mL/min (ref >60)
Glucose, Bld: 128 mg/dL — ABNORMAL HIGH (ref 70–99)
Potassium: 4.1 mmol/L (ref 3.5–5.1)
Sodium: 136 mmol/L (ref 135–145)

## 2019-09-23 LAB — CBC WITH DIFFERENTIAL/PLATELET
Abs Immature Granulocytes: 0.06 10*3/uL (ref 0.00–0.07)
Basophils Absolute: 0 10*3/uL (ref 0.0–0.1)
Basophils Relative: 0 %
Eosinophils Absolute: 0 10*3/uL (ref 0.0–0.5)
Eosinophils Relative: 0 %
HCT: 37.9 % (ref 36.0–46.0)
Hemoglobin: 11.6 g/dL — ABNORMAL LOW (ref 12.0–15.0)
Immature Granulocytes: 1 %
Lymphocytes Relative: 23 %
Lymphs Abs: 1.9 10*3/uL (ref 0.7–4.0)
MCH: 31.6 pg (ref 26.0–34.0)
MCHC: 30.6 g/dL (ref 30.0–36.0)
MCV: 103.3 fL — ABNORMAL HIGH (ref 80.0–100.0)
Monocytes Absolute: 0.6 10*3/uL (ref 0.1–1.0)
Monocytes Relative: 7 %
Neutro Abs: 5.7 10*3/uL (ref 1.7–7.7)
Neutrophils Relative %: 69 %
Platelets: 271 10*3/uL (ref 150–400)
RBC: 3.67 MIL/uL — ABNORMAL LOW (ref 3.87–5.11)
RDW: 13.3 % (ref 11.5–15.5)
WBC: 8.4 10*3/uL (ref 4.0–10.5)
nRBC: 0 % (ref 0.0–0.2)

## 2019-09-23 MED ORDER — TIZANIDINE HCL 2 MG PO TABS
2.0000 mg | ORAL_TABLET | Freq: Once | ORAL | Status: AC
  Administered 2019-09-23: 2 mg via ORAL
  Filled 2019-09-23: qty 1

## 2019-09-23 MED ORDER — OXYCODONE HCL ER 10 MG PO T12A
10.0000 mg | EXTENDED_RELEASE_TABLET | Freq: Two times a day (BID) | ORAL | Status: DC
  Administered 2019-09-23 – 2019-09-25 (×6): 10 mg via ORAL
  Filled 2019-09-23 (×7): qty 1

## 2019-09-23 NOTE — Progress Notes (Signed)
Occupational Therapy Session Note  Patient Details  Name: Julia Daniels MRN: [id number] Date of Birth: 01/07/1951  Today's Date: 09/23/2019 OT Individual Time: 1500-1530 OT Individual Time Calculation (min): 30 min    Short Term Goals: Week 1:  OT Short Term Goal 1 (Week 1): Pt will don shirt with S OT Short Term Goal 1 - Progress (Week 1): Met OT Short Term Goal 2 (Week 1): Pt will don orthosis wiht MIN A OT Short Term Goal 2 - Progress (Week 1): Progressing toward goal OT Short Term Goal 3 (Week 1): Pt will stand pivot to BSC/toilet wiht S OT Short Term Goal 3 - Progress (Week 1): Progressing toward goal OT Short Term Goal 4 (Week 1): Pt will thread BLE into pants wiht S and AE PRN OT Short Term Goal 4 - Progress (Week 1): Progressing toward goal OT Short Term Goal 5 (Week 1): Pt will perform funcitonal task for 2 min prior to needing rest break to demo improved activity tolerance OT Short Term Goal 5 - Progress (Week 1): Met Week 2:  OT Short Term Goal 1 (Week 2): Pt will perform funcitonal task for 3-5 min prior to needing rest break to demo improved activity tolerance OT Short Term Goal 2 (Week 2): Pt will thread BLE into pants with S and AE PRN OT Short Term Goal 3 (Week 2): Pt will perform 3/3 toileting tasks with S with AE PRN OT Short Term Goal 4 (Week 2): Pt will perform LB bathing with S with AE PRN  Skilled Therapeutic Interventions/Progress Updates:    Patient in bed, alert and states that her pain in this position is under control at the moment.  On 2L O2 via [place] t/o session.  Reviewed and practiced LB stretching options that she was able to completed for calves and hamstrings with min A - no pain.  Rolling right and side lying to sitting edge of bed with CS.  Mod a to donn TLSO - cues for set up of straps.  Sit to stand at edge of bed CGA - she tolerates weight shift and static standing for 3 minutes - returned to sitting edge of bed due to fatigue and increased SOB.   Maintained unsupported sitting with DS.  Returned to supine due to fatigue with mod A  - nursing to provide breathing treatment and pain meds at close of session.  Bed alarm set and call bell in hand.    Therapy Documentation Precautions:  Precautions Precautions: Fall Precaution Comments: new T6 comp Fx and COPD (home O2 at night/PRN day) Required Braces or Orthoses: Spinal Brace Spinal Brace: Thoracolumbosacral orthotic, Applied in sitting position Restrictions Weight Bearing Restrictions: No Other Position/Activity Restrictions: Don/doff EOB for self teaching   Therapy/Group: Individual Therapy  Carlos Levering 09/23/2019, 7:42 AM

## 2019-09-23 NOTE — Progress Notes (Signed)
Occupational Therapy Session Note  Patient Details  Name: Julia Daniels MRN: [id number] Date of Birth: [birth year]  Today's Date: 09/23/2019 OT Individual Time: 2263-3354 OT Individual Time Calculation (min): 56 min    Short Term Goals: Week 2:  OT Short Term Goal 1 (Week 2): Pt will perform funcitonal task for 3-5 min prior to needing rest break to demo improved activity tolerance OT Short Term Goal 2 (Week 2): Pt will thread BLE into pants with S and AE PRN OT Short Term Goal 3 (Week 2): Pt will perform 3/3 toileting tasks with S with AE PRN OT Short Term Goal 4 (Week 2): Pt will perform LB bathing with S with AE PRN  Skilled Therapeutic Interventions/Progress Updates:    Pt greeted at time of session supine in bed resting, with some back pain but able to participate in OT session. Supine to sit EOB with Supervision with bed features, sit to stand in same manner and ambulated to bathroom shower with CS with RW, transferred to shower seat CS/CGA. Doffed clothing with assist d/t pain and urgency, pain increases with sitting and pt wanted to quickly shower. UB bathe supervision, LB bathe CGA with cues for no BLT for back precautions as pt has a tendency to try to twist to reach buttocks. Ambulated back to bed with CGA/CS and performed UB dress Mod with cues to dress LUE first d/t this caused pain in back, assist to thread over head as well d/t pain. LB dress Min with reacher for underwear and pants, static stand to don over hips. Sit to supine with Min A, pt continues to have difficulty sequencing and safety returning to supine and moving legs in unison. O2 briefly removed during shower but pt stating "I need my oxygen" and reapplied, also trialed without O2 for dressing and dropped to 90%, pt has tendency to slightly hyperventilate and difficulty relaxing d/t pain. Pt in supine positioned for comfort, alarm on call bell in reach.     Therapy Documentation Precautions:   Precautions Precautions: Fall Precaution Comments: new T6 comp Fx and COPD (home O2 at night/PRN day) Required Braces or Orthoses: Spinal Brace Spinal Brace: Thoracolumbosacral orthotic, Applied in sitting position Restrictions Weight Bearing Restrictions: No Other Position/Activity Restrictions: Don/doff EOB for self teaching     Therapy/Group: Individual Therapy  Viona Gilmore 09/23/2019, 10:45 AM

## 2019-09-23 NOTE — Progress Notes (Signed)
[facility] PHYSICAL MEDICINE & REHABILITATION PROGRESS NOTE   Subjective/Complaints: Complaining of pain in left thoracic spinal muscle.  Denies constipation. Has been waking at night to take pain medication. Breathing well on 2L [place]  ROS: (+) SOB- since admission;   Pt deniesabd pain, CP, N/V/C/D, and vision changes   Objective:   No results found. Recent Labs    09/22/19 0708  WBC 7.2  HGB 14.6  HCT 47.3*  PLT 166   No results for input(s): NA, K, CL, CO2, GLUCOSE, BUN, CREATININE, CALCIUM in the last 72 hours.  Intake/Output Summary (Last 24 hours) at 09/23/2019 0842 Last data filed at 09/23/2019 0700 Gross per 24 hour  Intake 722 ml  Output --  Net 722 ml     Physical Exam: Vital Signs Blood pressure 139/77, pulse 79, temperature 98.9 F (37.2 C), resp. rate 19, height 5\' 3"  (1.6 m), weight 90.3 kg, SpO2 94 %. General: Alert and oriented x 3, No apparent distress HEENT: Head is normocephalic, atraumatic, PERRLA, EOMI, sclera anicteric, oral mucosa pink and moist, dentition intact, ext ear canals clear,  Neck: Supple without JVD or lymphadenopathy Heart: Reg rate and rhythm. No murmurs rubs or gallops Chest: CTA bilaterally without wheezes, rales, or rhonchi; no distress Abdomen: Soft, non-tender, non-distended, bowel sounds positive. Extremities: No clubbing, cyanosis, or edema. Pulses are 2+ Skin: Warm and dry.  Intact.  Scattered ecchymosis. Psych: Normal mood.  Normal behavior. Musc: No edema in extremities.  No tenderness in extremities. TTP in left thoracic paraspinal. Lidocaine patch is in place here. Difficulty log rolling, needs assistance.  Neuro: Alert Motor: 4/5 throughout, unchanged   Assessment/Plan: 1. Functional deficits secondary to Debility due to COPD exacerbation, prolonged hospitalization  which require 3+ hours per day of interdisciplinary therapy in a comprehensive inpatient rehab setting.  Physiatrist is providing close team supervision  and 24 hour management of active medical problems listed below.  Physiatrist and rehab team continue to assess barriers to discharge/monitor patient progress toward functional and medical goals  Care Tool:  Bathing    Body parts bathed by patient: Chest, Abdomen, Face, Right arm, Left arm, Right upper leg, Left upper leg, Front perineal area, Buttocks, Right lower leg, Left lower leg   Body parts bathed by helper: Buttocks, Right lower leg, Left lower leg, Front perineal area     Bathing assist Assist Level: Contact Guard/Touching assist     Upper Body Dressing/Undressing Upper body dressing   What is the patient wearing?: Orthosis Orthosis activity level: Performed by helper  Upper body assist Assist Level: Moderate Assistance - Patient 50 - 74%    Lower Body Dressing/Undressing Lower body dressing      What is the patient wearing?: Pants, Underwear/pull up     Lower body assist Assist for lower body dressing: Contact Guard/Touching assist     Toileting Toileting    Toileting assist Assist for toileting: Contact Guard/Touching assist Assistive Device Comment:  (BSC)   Transfers Chair/bed transfer  Transfers assist     Chair/bed transfer assist level: Supervision/Verbal cueing     Locomotion Ambulation   Ambulation assist      Assist level: Supervision/Verbal cueing Assistive device: Walker-rolling Max distance: 100'   Walk 10 feet activity   Assist     Assist level: Supervision/Verbal cueing Assistive device: Walker-rolling   Walk 50 feet activity   Assist Walk 50 feet with 2 turns activity did not occur: Safety/medical concerns  Assist level: Supervision/Verbal cueing Assistive device: Walker-rolling  Walk 150 feet activity   Assist Walk 150 feet activity did not occur: Safety/medical concerns  Assist level: Supervision/Verbal cueing Assistive device: Walker-rolling    Walk 10 feet on uneven surface  activity   Assist Walk  10 feet on uneven surfaces activity did not occur: Safety/medical concerns         Wheelchair     Assist Will patient use wheelchair at discharge?: No             Wheelchair 50 feet with 2 turns activity    Assist            Wheelchair 150 feet activity     Assist          Blood pressure 139/77, pulse 79, temperature 98.9 F (37.2 C), resp. rate 19, height 5\' 3"  (1.6 m), weight 90.3 kg, SpO2 94 %.  Medical Problem List and Plan: 1.  Decreased functional ability secondary to recurrent COPD exacerbation with compression fracture T6.  Back brace when out of bed..  Continue nebulizer treatments as directed.  Prednisone tapered  Continue CIR  Team conference today.  2.  Antithrombotics: -DVT/anticoagulation: Lovenox- d/c since ambulated 200 feet.              -antiplatelet therapy: N/A 3. Pain Management: Baclofen 10 mg 3 times daily, Lidoderm patch, hydrocodone as needed. Log roll to minimize back pain  Increased Norco to 5-10 mg q4 hours prn due to compression   Added ibuprofen 800mg  BID prn for breakthrough pain. Hgb anmd Cr normal  lidoderm patches to daytime 8am to 8pm to start in AM  kpad helping pain, esp at night- will con't regimen  9/14: waking at night for pain meds- switched from Norco to extended release Oxycontin.  4. Mood: Lamictal 200 mg nightly, Effexor 280 mg daily Concerta 72 mg daily Xanax as needed. Has some anxiety.              -antipsychotic agents: Seroquel 150 mg nightly, Risperdal 3 mg nightly  Stable on 9/12 5. Neuropsych: This patient is capable of making decisions on her own behalf. 6. Skin/Wound Care: Routine skin checks 7. Fluids/Electrolytes/Nutrition: Routine in and outs. 8. Acute on chronic respiratory failure with hypoxia: continue to monitor saturations closely with therapy.  Wean oxygen as tolerated. - Pt had prn O2 at home, but didn't wear full time.  Continue to wean supplemental oxygen as tolerated, may need to go  home on supplemental O2  9/13- hasn't been able to wean at ALL from 2L O2- was on at home prior- spoke with OT about checking if she needs it with exercise.   9/14: desatting to 80s with 1L O2, doing well with 2L O2 9. Leukocytosis  WBCs 12.1 on 9/6, labs still pending  Afebrile  9/13- labs ordered for tomorrow 10.  Drug-induced constipation  Overall improving  LOS: 11 days A FACE TO FACE EVALUATION WAS PERFORMED  Clide Deutscher Joal Eakle 09/23/2019, 8:42 AM

## 2019-09-23 NOTE — Progress Notes (Signed)
Occupational Therapy Session Note  Patient Details  Name: Julia Daniels MRN: [id number] Date of Birth: Aug 19, 1950  Today's Date: 09/23/2019 OT Individual Time: 1345-1425 OT Individual Time Calculation (min): 40 min    Short Term Goals: Week 2:  OT Short Term Goal 1 (Week 2): Pt will perform funcitonal task for 3-5 min prior to needing rest break to demo improved activity tolerance OT Short Term Goal 2 (Week 2): Pt will thread BLE into pants with S and AE PRN OT Short Term Goal 3 (Week 2): Pt will perform 3/3 toileting tasks with S with AE PRN OT Short Term Goal 4 (Week 2): Pt will perform LB bathing with S with AE PRN  Skilled Therapeutic Interventions/Progress Updates:    Pt resting in bed upon arrival.  Pt reports that pain is improved compared to previous day and she wants to get OOB and walk. Bed mobility with supervision. Mod A for donning orthosis. Sit<>stand and functional amb with RW with CGA and assistance for O2 transport. Pt amb from room to ADL apartment with 2 seated rest breaks. Pt amb to nursing station and requested to sit down. Pt stated she couldn't walk any further because of pain.  Pt also commented that she needed to use toilet. NT accompanied to room and assisted with toileting. Pt in room with NT present.   Therapy Documentation Precautions:  Precautions Precautions: Fall Precaution Comments: new T6 comp Fx and COPD (home O2 at night/PRN day) Required Braces or Orthoses: Spinal Brace Spinal Brace: Thoracolumbosacral orthotic, Applied in sitting position Restrictions Weight Bearing Restrictions: No Other Position/Activity Restrictions: Don/doff EOB for self teaching Pain: Pain Assessment Pain Scale: 0-10 Pain Score: 7  Pain Type: Chronic pain Pain Location: Back Pain Orientation: Mid Pain Descriptors / Indicators: Constant Pain Frequency: Constant Pain Onset: On-going Patients Stated Pain Goal: 2 Pain Intervention(s): Meds admin prior to therapy;  emotional support, repositioned   Therapy/Group: Individual Therapy  Leroy Libman 09/23/2019, 2:33 PM

## 2019-09-23 NOTE — Progress Notes (Signed)
Patient ID: Julia Daniels, female   DOB: 06-Apr-1950, 69 y.o.   MRN: [id number]  SW met with pt in room to provide updates from team conference, and change in pt d/c date from 9/17 to 9/21. Pt in agreement. Pt understands that it is likely she will require continuous o2 at discharge. Pt encouraged to tell son to bring in portable oxygen tank so she can have at discharge. Pt aware SW to order: RW, BSC, and TTB. Pt reports that she believes therapy discussed rollator. SW informed will confirm and follow-up. Pt will need HHA list.  *SW confirmed with therapy pt does not need a rollator due to safety reasons.   Loralee Pacas, MSW, [place] Office: [phone] Cell: [phone] Fax: [phone]

## 2019-09-23 NOTE — Progress Notes (Signed)
Physical Therapy Session Note  Patient Details  Name: Julia Daniels MRN: [id number] Date of Birth: Aug 12, 1950  Today's Date: 09/23/2019 PT Individual Time: 0800-0900 PT Individual Time Calculation (min): 60 min  PT Missed Time: 15 min Missed Time Reason: pain  Short Term Goals: Week 2:  PT Short Term Goal 1 (Week 2): =LTG due to ELOS  Skilled Therapeutic Interventions/Progress Updates:    Pt received seated in bed, agreeable to PT session. Pt reports 5/10 pain at rest that increases to 8/10 with mobility. Pt premedicated prior to start of therapy session and has lidocaine patches in place. Supine to sit with Supervision. Pt reports urgent need to use the bathroom so assisted with donning TLSO dependently for time conservation. Sit to stand with Supervision to RW. Ambulation into bathroom with RW at Supervision level. Toilet transfer Supervision with RW, min A for clothing management and dependent for pericare due to pain. See Flowsheet for details. Ambulation 2 x 50 ft, 2 x 100 ft with RW at Supervision level with several standing rest breaks due to fatigue and pain. Pt initially on 2L O2, SpO2 remains at 99% and higher with activity so decreased to 1L O2. SpO2 drops to 86% with activity and does not recover to 90% (+) so increased back to 2L O2. Pt continues to be severely limited in her ability to functionally participate in therapy session due to back pain. MD in room to assess pain and change pain medication regimen. Pt returned to supine with min A for LE management. Pt left semi-reclined in bed with needs in reach, bed alarm in place. Pt missed 15 min of scheduled therapy session due to pain.  Therapy Documentation Precautions:  Precautions Precautions: Fall Precaution Comments: new T6 comp Fx and COPD (home O2 at night/PRN day) Required Braces or Orthoses: Spinal Brace Spinal Brace: Thoracolumbosacral orthotic, Applied in sitting position Restrictions Weight Bearing  Restrictions: No Other Position/Activity Restrictions: Don/doff EOB for self teaching    Therapy/Group: Individual Therapy   Excell Seltzer, PT, DPT  09/23/2019, 9:14 AM

## 2019-09-23 NOTE — Patient Care Conference (Signed)
Inpatient RehabilitationTeam Conference and Plan of Care Update Date: 09/23/2019   Time: 11:06 AM    Patient Name: Julia Daniels      Medical Record Number: [id number]  Date of Birth: 12-May-1950 Sex: Female         Room/Bed: 4M09C/4M09C-01 Payor Info: Payor: [place] / Plan: [employer] COMM PPO / Product Type: *No Product type* /    Admit Date/Time:  09/12/2019  4:51 PM  Primary Diagnosis:  [facility] Problems: Principal Problem:   Debility Active Problems:   COPD exacerbation ([place])   Drug induced constipation   Leukocytosis   Supplemental oxygen dependent   Anxiety state    Expected Discharge Date: Expected Discharge Date: 09/30/19  Team Members Present: Physician leading conference: Dr. Leeroy Cha Care Coodinator Present: Loralee Pacas, LCSWA;Shelda Truby Creig Hines, RN, BSN, [place] Nurse Present: Suella Grove, RN PT Present: Excell Seltzer, PT OT Present: Willeen Cass, OT;Roanna Epley, COTA PPS Coordinator present : Ileana Ladd, Burna Mortimer, SLP     Current Status/Progress Goal Weekly Team Focus  Bowel/Bladder   Continent x2; Last BM 9/11  Remain continent  Assess every shift and as needed   Swallow/Nutrition/ Hydration             ADL's   bathing-CGA/min A' UB dresing-mod A for orthosis; LB dressing-min A; toileting-CGA; functional transfers-supervision; limited by pain  mod I overall  activity tolerance, BADL retraining, pain management, safety awareness   Mobility   Supervision to mod A for bed mobility depending on pain, Supervision transfers and gait up to 120' with RW, CGA stairs with R handrail  supervision-mod I  pain management, endurance, bed mobility   Communication             Safety/Cognition/ Behavioral Observations            Pain   Pain 7/10 during shift; meds given per [facility], Kpad in place  Pain <5/10  Assess every shift and as needed   Skin   Abrasion R arm, b/l arm bruising  Prevent further breakdown  Assess every  shift and as needed     Discharge Planning:  Pt to d/c to home with intermittent support from her son who works 2nd/3rd shift. Pt has been working 3rd shift PTA. Pt would like to return to work if possible. Pt is beginning the process for STD/LTD through employer. FMLA forms have been completed.   Team Discussion: Pain not controlled, Continent B/B. Mobility is good. Mod assist to don brace, could only ambulate to nurses station-no further. Will go home on O2. Patient on target to meet rehab goals: yes  *See Care Plan and progress notes for long and short-term goals.   Revisions to Treatment Plan:  MD to order x-ray of Thoracic region. Teaching Needs: Continue family education.  Current Barriers to Discharge: Pain and endurance.  Possible Resolutions to Barriers: Continue medication regimen for pain control, allow rest breaks during therapy sessions and in between sessions.     Medical Summary Current Status: Pain is uncontrolled, desatting and with shortness of breath, had BM today with therapy  Barriers to Discharge: New oxygen;Medical stability  Barriers to Discharge Comments: Pain- left thoracic paraspinal pain is worst-limiting her therapy session Possible Resolutions to Barriers/Weekly Focus: Tizanidine x1 for muscle spasm, transition from [place] to Oxycontin, continue lidocaine patch, patient to discuss with employer to see if she can work with oxygen, thoracic XR   Continued Need for Acute Rehabilitation Level of Care: The patient  requires daily medical management by a physician with specialized training in physical medicine and rehabilitation for the following reasons: Direction of a multidisciplinary physical rehabilitation program to maximize functional independence : Yes Medical management of patient stability for increased activity during participation in an intensive rehabilitation regime.: Yes Analysis of laboratory values and/or radiology reports with any subsequent  need for medication adjustment and/or medical intervention. : Yes   I attest that I was present, lead the team conference, and concur with the assessment and plan of the team.   Cristi Loron 09/23/2019, 2:37 PM

## 2019-09-24 ENCOUNTER — Inpatient Hospital Stay (HOSPITAL_COMMUNITY): Payer: BC Managed Care – PPO

## 2019-09-24 ENCOUNTER — Inpatient Hospital Stay (HOSPITAL_COMMUNITY): Payer: BC Managed Care – PPO | Admitting: Physical Therapy

## 2019-09-24 ENCOUNTER — Inpatient Hospital Stay (HOSPITAL_COMMUNITY): Payer: BC Managed Care – PPO | Admitting: Occupational Therapy

## 2019-09-24 MED ORDER — TIZANIDINE HCL 2 MG PO TABS
2.0000 mg | ORAL_TABLET | Freq: Three times a day (TID) | ORAL | Status: DC | PRN
  Administered 2019-09-27 – 2019-09-28 (×2): 2 mg via ORAL
  Filled 2019-09-24 (×2): qty 1

## 2019-09-24 NOTE — Telephone Encounter (Signed)
This product does not require authorization at this time, and may be covered at the pharmacy in accordance with your benefit plan.  THIS IS THE OUTCOME OF THE PA FOR [employer]. NOTHING FURTHER IS NEEDED.

## 2019-09-24 NOTE — Progress Notes (Signed)
Occupational Therapy Session Note  Patient Details  Name: Julia Daniels MRN: [id number] Date of Birth: 06/09/1950  Today's Date: 09/24/2019 OT Individual Time: 5183-3582 OT Individual Time Calculation (min): 70 min    Short Term Goals: Week 2:  OT Short Term Goal 1 (Week 2): Pt will perform funcitonal task for 3-5 min prior to needing rest break to demo improved activity tolerance OT Short Term Goal 2 (Week 2): Pt will thread BLE into pants with S and AE PRN OT Short Term Goal 3 (Week 2): Pt will perform 3/3 toileting tasks with S with AE PRN OT Short Term Goal 4 (Week 2): Pt will perform LB bathing with S with AE PRN  Skilled Therapeutic Interventions/Progress Updates:    Pt greeted at time of session supine in bed resting comfortably agreeable to OT session, some c/o pain throughout session high of 7/10 but pt premedicated and rest breaks provided as needed. Supine to sit Supervision, donned TLSO Mod A, ambulated to bathroom with CS and transferred to toilet and performed toileting tasks in same manner. Discussed techniques to help when the pt has BM to prevent bending/twisting. Donned new underwear and pants with Min A. Ambulated to wheelchair and set up at sink where she performed oral hygiene and grooming with Supervision. Ambulated throughout the hallway approx 70 feet x2 trials with RW with O2 at 2L continuous. O2 removed during rest breaks and sats at 93% or higher. Ambulated back to room, ambulating transfer to bed CS/CGA. Sit to supine Min A, continues to have trouble bringing BLEs up in unison. Pt had questions regarding bed rails, showed options online but recommended f/u with PT as well. Alarm on, call bell in reach.   Therapy Documentation Precautions:  Precautions Precautions: Fall Precaution Comments: new T6 comp Fx and COPD (home O2 at night/PRN day) Required Braces or Orthoses: Spinal Brace Spinal Brace: Thoracolumbosacral orthotic, Applied in sitting  position Restrictions Weight Bearing Restrictions: No Other Position/Activity Restrictions: Don/doff EOB for self teaching     Therapy/Group: Individual Therapy  Viona Gilmore 09/24/2019, 10:40 AM

## 2019-09-24 NOTE — Progress Notes (Signed)
Physical Therapy Session Note  Patient Details  Name: Julia Daniels MRN: [id number] Date of Birth: 27-Aug-1950  Today's Date: 09/24/2019 PT Individual Time: 0037-0488 PT Individual Time Calculation (min): 55 min   Short Term Goals: Week 2:  PT Short Term Goal 1 (Week 2): =LTG due to ELOS  Skilled Therapeutic Interventions/Progress Updates:    Pt received seated in bed, agreeable to PT session. Pt reports improvement in pain this date but reports increase in difficulty with breathing. Pt on 2L O2 at rest, SpO2 in 90's. Semi-reclined to sitting EOB with Supervision with use of bedrail. Pt is min A to don TLSO while seated EOB. Sit to stand with Supervision to RW. Ambulation into bathroom with RW and Supervision. Toilet transfer Supervision, independent for clothing management and pericare. Ambulation 3 x 50 ft, 3 x 100 ft with RW and Supervision while on 2L O2. SpO2 drops to 87% with activity while on 2L, returns to 90% (+) with seated rest break and pursed lip breathing techniques. Pt requires extended standing or seated rest breaks between bouts of ambulation for recovery with breathing due to feeling SOA. Pt declines to attempt stairs again this date. Pt requests to return to bed at end of session. Sit to semi-reclined at Supervision level with increased time needed for LE management. Pt left semi-reclined in bed with needs in reach at end of session.  Therapy Documentation Precautions:  Precautions Precautions: Fall Precaution Comments: new T6 comp Fx and COPD (home O2 at night/PRN day) Required Braces or Orthoses: Spinal Brace Spinal Brace: Thoracolumbosacral orthotic, Applied in sitting position Restrictions Weight Bearing Restrictions: No Other Position/Activity Restrictions: Don/doff EOB for self teaching    Therapy/Group: Individual Therapy   Excell Seltzer, PT, DPT  09/24/2019, 5:20 PM

## 2019-09-24 NOTE — Progress Notes (Signed)
Occupational Therapy Session Note  Patient Details  Name: GEORGENIA SALIM MRN: [id number] Date of Birth: [birth year]  Today's Date: 09/24/2019 OT Individual Time: 1100-1200 OT Individual Time Calculation (min): 60 min    Short Term Goals: Week 2:  OT Short Term Goal 1 (Week 2): Pt will perform funcitonal task for 3-5 min prior to needing rest break to demo improved activity tolerance OT Short Term Goal 2 (Week 2): Pt will thread BLE into pants with S and AE PRN OT Short Term Goal 3 (Week 2): Pt will perform 3/3 toileting tasks with S with AE PRN OT Short Term Goal 4 (Week 2): Pt will perform LB bathing with S with AE PRN  Skilled Therapeutic Interventions/Progress Updates:    Pt resting in bed upon arrival.  OT intervention with focus on endurance, energy conservation strategies, and safety awareness to increase independence with BADLs and IADLs. Supine>sit EOB with supervision. Sit<>stand and functional amb with RW with supervision. Pt amb with RW to main gym with standing rest breaks X 2 and seated rest breaks X 2. Pt returned to room with standing rest break X 1 and seated rest break X 1. O2 sats>93% on 2L O2. HR 110-120. Pt requires max verbal cues for pursed lip breathing. Pt required mod A for sit>supine. Pt remained in bed with all needs within reach and bed alarm activated.   Therapy Documentation Precautions:  Precautions Precautions: Fall Precaution Comments: new T6 comp Fx and COPD (home O2 at night/PRN day) Required Braces or Orthoses: Spinal Brace Spinal Brace: Thoracolumbosacral orthotic, Applied in sitting position Restrictions Weight Bearing Restrictions: No Other Position/Activity Restrictions: Don/doff EOB for self teaching  Pain:   Pt c/o 6/10 back pain; activity, repositioning, and emotional support   Therapy/Group: Individual Therapy  Leroy Libman 09/24/2019, 12:13 PM

## 2019-09-24 NOTE — Progress Notes (Addendum)
Patient ID: Julia Daniels, female   DOB: 1950-12-22, 69 y.o.   MRN: [id number]  DME ordered: BSC, TTB, and RW with [employer] via parachute.   SW met with pt in room to provide updates on above DME, and informed on rollator not suggested by therapy. SW provided pt with H[facility] list via [email or web address]. Preferred H[facility] is [place]. SW spoke with [facility] [phone]) to discuss HHPT/OT. Unable to accept due to staffing.   SW sent referral to [facility]. SW waiting on follow-up.    Loralee Pacas, MSW, [place] Office: [phone] Cell: [phone] Fax: [phone]

## 2019-09-24 NOTE — Progress Notes (Signed)
[facility] PHYSICAL MEDICINE & REHABILITATION PROGRESS NOTE   Subjective/Complaints: Tizanidine helped with her muscle spasms yesterday. I have added as prn. Advised regarding side effects.  Long release oxycontin is really helping with the pain.  Discussed meditation to help with breathing  ROS: (+) SOB- since admission;   Pt denies abd pain, CP, N/V/C/D, and vision changes   Objective:   DG Thoracic Spine W/Swimmers  Result Date: 09/23/2019 CLINICAL DATA:  Pain.  Additional provided: Upper to mid back pain. EXAM: THORACIC SPINE - 3 VIEWS COMPARISON:  CT angiogram chest 09/08/2019. FINDINGS: Redemonstrated T5 compression fracture with severe vertebral body height loss. There is no definite significant bony retropulsion at this level. No appreciable significant spondylolisthesis. No interval thoracic vertebral compression fracture is identified as compared to the prior examination of 09/08/2019. Multilevel disc space narrowing, degenerative endplate sclerosis and prominent ventrolateral osteophytes. IMPRESSION: Redemonstrated severe T5 compression fracture. No appreciable interval thoracic vertebral compression fracture as compared to the CT chest of 09/08/2019. Thoracic spondylosis as described. Electronically Signed   By: Kellie Simmering DO   On: 09/23/2019 13:05   Recent Labs    09/22/19 0708 09/23/19 0827  WBC 7.2 8.4  HGB 14.6 11.6*  HCT 47.3* 37.9  PLT 166 271   Recent Labs    09/23/19 0827  NA 136  K 4.1  CL 99  CO2 26  GLUCOSE 128*  BUN 8  CREATININE 0.77  CALCIUM 9.8    Intake/Output Summary (Last 24 hours) at 09/24/2019 0834 Last data filed at 09/24/2019 8250 Gross per 24 hour  Intake 642 ml  Output --  Net 642 ml     Physical Exam: Vital Signs Blood pressure (!) 154/86, pulse 72, temperature (!) 97.4 F (36.3 C), temperature source Tympanic, resp. rate 18, height 5\' 3"  (1.6 m), weight 90.3 kg, SpO2 96 %. General: Alert and oriented x 3, No apparent  distress HEENT: Head is normocephalic, atraumatic, PERRLA, EOMI, sclera anicteric, oral mucosa pink and moist, dentition intact, ext ear canals clear,  Neck: Supple without JVD or lymphadenopathy Heart: Reg rate and rhythm. No murmurs rubs or gallops Chest: CTA bilaterally without wheezes, rales, or rhonchi; no distress. Breathing better Abdomen: Soft, non-tender, non-distended, bowel sounds positive. Extremities: No clubbing, cyanosis, or edema. Pulses are 2+ Skin: Warm and dry.  Intact.  Scattered ecchymosis. Psych: Normal mood.  Normal behavior. Musc: No edema in extremities.  No tenderness in extremities. TTP in left thoracic paraspinal. Lidocaine patch is in place here. Difficulty log rolling, needs assistance.  Neuro: Alert Motor: 4/5 throughout, unchanged   Assessment/Plan: 1. Functional deficits secondary to Debility due to COPD exacerbation, prolonged hospitalization  which require 3+ hours per day of interdisciplinary therapy in a comprehensive inpatient rehab setting.  Physiatrist is providing close team supervision and 24 hour management of active medical problems listed below.  Physiatrist and rehab team continue to assess barriers to discharge/monitor patient progress toward functional and medical goals  Care Tool:  Bathing    Body parts bathed by patient: Chest, Abdomen, Face, Right arm, Left arm, Right upper leg, Left upper leg, Front perineal area, Buttocks, Right lower leg, Left lower leg   Body parts bathed by helper: Buttocks, Right lower leg, Left lower leg, Front perineal area     Bathing assist Assist Level: Contact Guard/Touching assist     Upper Body Dressing/Undressing Upper body dressing   What is the patient wearing?: Pull over shirt Orthosis activity level: Performed by helper  Upper body  assist Assist Level: Moderate Assistance - Patient 50 - 74%    Lower Body Dressing/Undressing Lower body dressing      What is the patient wearing?: Pants,  Underwear/pull up     Lower body assist Assist for lower body dressing: Minimal Assistance - Patient > 75%     Toileting Toileting    Toileting assist Assist for toileting: Contact Guard/Touching assist Assistive Device Comment:  (BSC)   Transfers Chair/bed transfer  Transfers assist     Chair/bed transfer assist level: Supervision/Verbal cueing     Locomotion Ambulation   Ambulation assist      Assist level: Supervision/Verbal cueing Assistive device: Walker-rolling Max distance: 100'   Walk 10 feet activity   Assist     Assist level: Supervision/Verbal cueing Assistive device: Walker-rolling   Walk 50 feet activity   Assist Walk 50 feet with 2 turns activity did not occur: Safety/medical concerns  Assist level: Supervision/Verbal cueing Assistive device: Walker-rolling    Walk 150 feet activity   Assist Walk 150 feet activity did not occur: Safety/medical concerns  Assist level: Supervision/Verbal cueing Assistive device: Walker-rolling    Walk 10 feet on uneven surface  activity   Assist Walk 10 feet on uneven surfaces activity did not occur: Safety/medical concerns         Wheelchair     Assist Will patient use wheelchair at discharge?: No             Wheelchair 50 feet with 2 turns activity    Assist            Wheelchair 150 feet activity     Assist          Blood pressure (!) 154/86, pulse 72, temperature (!) 97.4 F (36.3 C), temperature source Tympanic, resp. rate 18, height 5\' 3"  (1.6 m), weight 90.3 kg, SpO2 96 %.  Medical Problem List and Plan: 1.  Decreased functional ability secondary to recurrent COPD exacerbation with compression fracture T6.  Back brace when out of bed..  Continue nebulizer treatments as directed.  Prednisone tapered  Continue CIR 2.  Antithrombotics: -DVT/anticoagulation: Lovenox- d/c since ambulated 200 feet.              -antiplatelet therapy: N/A 3. Pain Management:  Baclofen 10 mg 3 times daily, Lidoderm patch, hydrocodone as needed. Log roll to minimize back pain  Increased Norco to 5-10 mg q4 hours prn due to compression   Added ibuprofen 800mg  BID prn for breakthrough pain. Hgb anmd Cr normal  lidoderm patches to daytime 8am to 8pm to start in AM  kpad helping pain, esp at night- will con't regimen  9/14: waking at night for pain meds- switched from Norco to extended release Oxycontin.   9/15: this has really helped. Thoracic spine is stable.  4. Mood: Lamictal 200 mg nightly, Effexor 270 mg daily Concerta 72 mg daily Xanax as needed. Has some anxiety.              -antipsychotic agents: Seroquel 150 mg nightly, Risperdal 3 mg nightly  Stable 9/15. 5. Neuropsych: This patient is capable of making decisions on her own behalf. 6. Skin/Wound Care: Routine skin checks 7. Fluids/Electrolytes/Nutrition: Routine in and outs. 8. Acute on chronic respiratory failure with hypoxia: continue to monitor saturations closely with therapy.  Wean oxygen as tolerated. - Pt had prn O2 at home, but didn't wear full time.  Continue to wean supplemental oxygen as tolerated, may need to go home on supplemental O2  9/13- hasn't been able to wean at ALL from 2L O2- was on at home prior- spoke with OT about checking if she needs it with exercise.   9/14: desatting to 80s with 1L O2, doing well with 2L O2  9/15: reassured that we will wean her off longterm. Advised meditation 9. Leukocytosis  WBCs 12.1 on 9/6, normal on 9/14  Afebrile 10.  Drug-induced constipation  Overall improving  LOS: 12 days A FACE TO FACE EVALUATION WAS PERFORMED  Clide Deutscher Ladarrian Asencio 09/24/2019, 8:34 AM

## 2019-09-25 ENCOUNTER — Inpatient Hospital Stay (HOSPITAL_COMMUNITY): Payer: BC Managed Care – PPO

## 2019-09-25 ENCOUNTER — Inpatient Hospital Stay (HOSPITAL_COMMUNITY): Payer: BC Managed Care – PPO | Admitting: Physical Therapy

## 2019-09-25 ENCOUNTER — Inpatient Hospital Stay (HOSPITAL_COMMUNITY): Payer: BC Managed Care – PPO | Admitting: Occupational Therapy

## 2019-09-25 MED ORDER — ALBUTEROL SULFATE (2.5 MG/3ML) 0.083% IN NEBU
2.5000 mg | INHALATION_SOLUTION | RESPIRATORY_TRACT | Status: DC | PRN
  Administered 2019-09-25 – 2019-09-26 (×4): 2.5 mg via RESPIRATORY_TRACT
  Filled 2019-09-25 (×6): qty 3

## 2019-09-25 NOTE — Progress Notes (Signed)
Occupational Therapy Weekly Progress Note  Patient Details  Name: Julia Daniels MRN: [id number] Date of Birth: [birth year]  Beginning of progress report period: September 18, 2019 End of progress report period: September 25, 2019  Today's Date: 09/25/2019 OT Individual Time: 0945-1000 OT Individual Time Calculation (min): 15 min  and Today's Date: 09/25/2019 OT Missed Time: 30 Minutes Missed Time Reason: Pain   Patient has met 1 of 4 short term goals.  Pt is inconsistent with progress toward OT goals, at times requiring close supervision and occasional CGA during shower level bathing, toileting tasks, and LB dressing but at times is severely limited by back pain and SOB. Pt has demonstrated good carryover with use of reacher and LHS for dressing/bathing tasks and would benefit form continued practice. Pt ed on pursed lip breathing and relaxation techniques to decrease pain. Continues to need O2 at 2L continuous.   Patient continues to demonstrate the following deficits: muscle weakness and back pain, decreased cardiorespiratoy endurance,   and decreased standing balance, decreased balance strategies and difficulty maintaining precautions and therefore will continue to benefit from skilled OT intervention to enhance overall performance with BADL and Reduce care partner burden.  Patient progressing toward long term goals..  Continue plan of care.  OT Short Term Goals Week 2:  OT Short Term Goal 1 (Week 2): Pt will perform funcitonal task for 3-5 min prior to needing rest break to demo improved activity tolerance OT Short Term Goal 1 - Progress (Week 2): Met OT Short Term Goal 2 (Week 2): Pt will thread BLE into pants with S and AE PRN OT Short Term Goal 2 - Progress (Week 2): Progressing toward goal OT Short Term Goal 3 (Week 2): Pt will perform 3/3 toileting tasks with S with AE PRN OT Short Term Goal 3 - Progress (Week 2): Progressing toward goal OT Short Term Goal 4 (Week 2): Pt will  perform LB bathing with S with AE PRN OT Short Term Goal 4 - Progress (Week 2): Progressing toward goal Week 3:  OT Short Term Goal 2 (Week 3): STGs = LTGs d/t ELOS at MOD I/Supervision level  Skilled Therapeutic Interventions/Progress Updates:    Pt greeted at time of session supine resting with labored breathing, O2 sats at 94% with elevated HR, on 2L via [place]. Nursing aware and meds have been administered. Pt stating she could barely participate in PT session this am d/t SOB and back pain, declining OOB activity today. Educated/discussion on potential myofacial release and STM when she can tolerate more activity, pt agreeable. Discussed continued DC planning in prep for DC home next week. Alarm on, call bell in reach. Missed 30 mins OT d/t pain and SOB.   Therapy Documentation Precautions:  Precautions Precautions: Fall Precaution Comments: new T6 comp Fx and COPD (home O2 at night/PRN day) Required Braces or Orthoses: Spinal Brace Spinal Brace: Thoracolumbosacral orthotic, Applied in sitting position Restrictions Weight Bearing Restrictions: No Other Position/Activity Restrictions: Don/doff EOB for self teaching     Therapy/Group: Individual Therapy  Viona Gilmore 09/25/2019, 10:08 AM

## 2019-09-25 NOTE — Progress Notes (Signed)
Patient ID: Julia Daniels, female   DOB: 05-09-1950, 69 y.o.   MRN: [id number]   SW met with pt in room to address questions related to o2 tank here, and STD forms that need to be completed. Pt has concentrator but missing gauge. Pt also states she does not know how to refill the portable concentrator. SW to speak with someone at [facility] to discuss further. Pt states she will have employer e-mail SW STD forms or fax for completion.  SW spoke with [name] who states she will have staff come by her room to look at the tanks she has here. She will also plan to have someone to come back out to the home for re-education on how to refill portable concentrators.   Loralee Pacas, MSW, [place] Office: [phone] Cell: [phone] Fax: [phone]

## 2019-09-25 NOTE — Progress Notes (Signed)
Physical Therapy Session Note  Patient Details  Name: Julia Daniels MRN: [id number] Date of Birth: 01-29-1950  Today's Date: 09/25/2019 PT Individual Time: 6301-6010 PT Individual Time Calculation (min): 43 min   and  Today's Date: 09/25/2019 PT Missed Time: 17 Minutes Missed Time Reason: Other (Comment)  Short Term Goals: Week 2:  PT Short Term Goal 1 (Week 2): =LTG due to ELOS  Skilled Therapeutic Interventions/Progress Updates:   Pt received supine in bed reporting she had a "rough night" stating that she "couldn't breath" and needed her nebulizer treatments closer together - notified Dr. Ranell Patrick of pt's difficulty breathing last night. Despite this, pt agreeable to therapy session stating that she needs to use the bathroom. Pt received and maintained on 2L of O2 via nasal cannula. Supine>sitting R EOB, HOB partially elevated and using bedrail, via logroll technique with supervision. Sit<>stands using RW with supervision during session. Gait ~42ft to bathroom using RW with supervision. Continent of bladder and bowels. Required multiple sit<>stands to/from toilet<>RW to complete LB clothing management and posterior peri-care with max/total assist due to pt having significantly labored breathing with significant SOB. RN in/out for lidocaine patch placement and notified of pt's labored breathing and SOB. Gait back to EOB using RW with supervision. Pt required another prolonged seated break EOB to manage breathing prior to performing supine>sit via reverse logroll technique with min assist for B LE management into the bed. Pt requested for increase to 3L of O2 due to significant feeling of SOB and continued to have significantly labored breathing. Pt required ~5-69minutes to slow breathing and feel less SOB. Pt states "I can't do this..I cannot go through that again." referring to how SOB she felt with going to/from bathroom. Therapist provided emotional support and notified MD of pt's breathing.  Pt left supine in bed with needs in reach, lines intact at 2L of O2 via nasal cannula, and bed alarm on. Missed 17 minutes of skilled physical therapy.  During session: SpO2 93-94% on 2-3L of O2 and HR 125-135bpm with activity   Therapy Documentation Precautions:  Precautions Precautions: Fall Precaution Comments: new T6 comp Fx and COPD (home O2 at night/PRN day) Required Braces or Orthoses: Spinal Brace Spinal Brace: Thoracolumbosacral orthotic, Applied in sitting position Restrictions Weight Bearing Restrictions: No Other Position/Activity Restrictions: Don/doff EOB for self teaching  Pain: Reports back pain, unrated, but pt does report having medications administered prior to start of therapy session and RN providing lidocaine patches during session.   Therapy/Group: Individual Therapy  Tawana Scale , PT, DPT, CSRS  09/25/2019, 7:53 AM

## 2019-09-25 NOTE — Progress Notes (Signed)
Patient became really SOB with ambulating to restroom, patient wasn't able to calm down and catch breath. O2sats maintaining above 90% on increased oxygen @ 5 liters. PRN breathing treatment given at this time, seems effective. Patient back on o2 @ 2Liters. Resting at this time. No complications noted at this time.  Audie Clear, LPN

## 2019-09-25 NOTE — Progress Notes (Signed)
Physical Therapy Session Note  Patient Details  Name: Julia Daniels MRN: [id number] Date of Birth: May 24, 1950  Today's Date: 09/25/2019 PT Individual Time: 9396-8864 PT Individual Time Calculation (min): 65 min   Short Term Goals: Week 1:  PT Short Term Goal 1 (Week 1): Pt will perform supine<>sit with min assist, not using bed features PT Short Term Goal 1 - Progress (Week 1): Progressing toward goal PT Short Term Goal 2 (Week 1): Pt will perform stand pivot transfers with supervision PT Short Term Goal 2 - Progress (Week 1): Met PT Short Term Goal 3 (Week 1): Pt will ambulate at least 46f using LRAD with supervision PT Short Term Goal 3 - Progress (Week 1): Met PT Short Term Goal 4 (Week 1): Pt will ascend/descend 4 steps using R HR only with CGA PT Short Term Goal 4 - Progress (Week 1): Met Week 2:  PT Short Term Goal 1 (Week 2): =LTG due to ELOS  Skilled Therapeutic Interventions/Progress Updates:    pt received in bed and agreeable to therapy however reports she has had increased work of breathing this date with nursing aware and feels that she is limited in what she can participate in but agreeable to try. Pt directed in supine>sit with log rolling (+) bedrails CGA. Pt required extra time at EOB to improve breathing pattern, once pt reported she felt recovered, pt directed in gait training with FWW 8' [facility] then required rest break 2/2 dyspnea. Pt directed in functional tasks at sink with brushing teeth, washing face, doffing/donning pants and gown top with setup for all except dressing at max A. Pt required intermittent sitting rest breaks and extra time after each task and intermittently during each task 2/2 dyspnea. Pt [facility] Once completed pt requested to return to bed. Pt directed in gait for 5' with FWW to return to bedside CGA, sit>supine min A and max A x2 for upward scooting in bed. Pt reported 7/10 pain in L side of mid back at  end of session and with HOB elveated at rest pt reported she felt her breathing was slightly better. Pt left in bed, alarm set, All needs in reach and in good condition. Call light in hand.  Pt remained on [place] at 2L as found throughout entire session   Therapy Documentation Precautions:  Precautions Precautions: Fall Precaution Comments: new T6 comp Fx and COPD (home O2 at night/PRN day) Required Braces or Orthoses: Spinal Brace Spinal Brace: Thoracolumbosacral orthotic, Applied in sitting position Restrictions Weight Bearing Restrictions: No Other Position/Activity Restrictions: Don/doff EOB for self teaching    Therapy/Group: Individual Therapy  HJunie Panning9/16/2021, 2:20 PM

## 2019-09-25 NOTE — Progress Notes (Signed)
[facility] PHYSICAL MEDICINE & REHABILITATION PROGRESS NOTE   Subjective/Complaints: Tizanidine and oxycodone ER have been helpful. She had a hard time breathing last night and this morning- CXR obtained last night and was with no acute changes. Afebrile. No constipation  ROS: (+) SOB- since admission;   Pt denies abd pain, CP, N/V/C/D, and vision changes   Objective:   DG Thoracic Spine W/Swimmers  Result Date: 09/23/2019 CLINICAL DATA:  Pain.  Additional provided: Upper to mid back pain. EXAM: THORACIC SPINE - 3 VIEWS COMPARISON:  CT angiogram chest 09/08/2019. FINDINGS: Redemonstrated T5 compression fracture with severe vertebral body height loss. There is no definite significant bony retropulsion at this level. No appreciable significant spondylolisthesis. No interval thoracic vertebral compression fracture is identified as compared to the prior examination of 09/08/2019. Multilevel disc space narrowing, degenerative endplate sclerosis and prominent ventrolateral osteophytes. IMPRESSION: Redemonstrated severe T5 compression fracture. No appreciable interval thoracic vertebral compression fracture as compared to the CT chest of 09/08/2019. Thoracic spondylosis as described. Electronically Signed   By: Kellie Simmering DO   On: 09/23/2019 13:05   DG CHEST PORT 1 VIEW  Result Date: 09/25/2019 CLINICAL DATA:  Shortness of breath. EXAM: PORTABLE CHEST 1 VIEW COMPARISON:  09/08/2019 FINDINGS: The heart is mildly enlarged but stable. There is tortuosity and mild calcification of the thoracic aorta. No infiltrates or effusions. Streaky basilar scarring changes. IMPRESSION: No acute cardiopulmonary findings. Electronically Signed   By: Marijo Sanes M.D.   On: 09/25/2019 06:38   Recent Labs    09/23/19 0827  WBC 8.4  HGB 11.6*  HCT 37.9  PLT 271   Recent Labs    09/23/19 0827  NA 136  K 4.1  CL 99  CO2 26  GLUCOSE 128*  BUN 8  CREATININE 0.77  CALCIUM 9.8    Intake/Output Summary  (Last 24 hours) at 09/25/2019 0911 Last data filed at 09/25/2019 0700 Gross per 24 hour  Intake 1022 ml  Output --  Net 1022 ml     Physical Exam: Vital Signs Blood pressure (!) 153/96, pulse 100, temperature 98.1 F (36.7 C), resp. rate (!) 24, height 5\' 3"  (1.6 m), weight 90.3 kg, SpO2 94 %. General: Alert and oriented x 3, No apparent distress HEENT: Head is normocephalic, atraumatic, PERRLA, EOMI, sclera anicteric, oral mucosa pink and moist, dentition intact, ext ear canals clear,  Neck: Supple without JVD or lymphadenopathy Heart: Reg rate and rhythm. No murmurs rubs or gallops Chest: CTA bilaterally with wheezing, +tachypnea Abdomen: Soft, non-tender, non-distended, bowel sounds positive. Extremities: No clubbing, cyanosis, or edema. Pulses are 2+ Skin: Warm and dry.  Intact.  Scattered ecchymosis. Psych: Normal mood.  Normal behavior.  Musc: No edema in extremities.  No tenderness in extremities. TTP in left thoracic paraspinal. Lidocaine patch is in place here. Difficulty log rolling, needs assistance.  Neuro: Alert Motor: 4/5 throughout, unchanged    Assessment/Plan: 1. Functional deficits secondary to Debility due to COPD exacerbation, prolonged hospitalization  which require 3+ hours per day of interdisciplinary therapy in a comprehensive inpatient rehab setting.  Physiatrist is providing close team supervision and 24 hour management of active medical problems listed below.  Physiatrist and rehab team continue to assess barriers to discharge/monitor patient progress toward functional and medical goals  Care Tool:  Bathing    Body parts bathed by patient: Chest, Abdomen, Face, Right arm, Left arm, Right upper leg, Left upper leg, Front perineal area, Buttocks, Right lower leg, Left lower leg  Body parts bathed by helper: Buttocks, Right lower leg, Left lower leg, Front perineal area     Bathing assist Assist Level: Contact Guard/Touching assist     Upper Body  Dressing/Undressing Upper body dressing   What is the patient wearing?: Pull over shirt Orthosis activity level: Performed by helper  Upper body assist Assist Level: Moderate Assistance - Patient 50 - 74%    Lower Body Dressing/Undressing Lower body dressing      What is the patient wearing?: Pants, Underwear/pull up     Lower body assist Assist for lower body dressing: Minimal Assistance - Patient > 75%     Toileting Toileting    Toileting assist Assist for toileting: Supervision/Verbal cueing Assistive Device Comment:  (BSC)   Transfers Chair/bed transfer  Transfers assist     Chair/bed transfer assist level: Supervision/Verbal cueing     Locomotion Ambulation   Ambulation assist      Assist level: Supervision/Verbal cueing Assistive device: Walker-rolling Max distance: 100'   Walk 10 feet activity   Assist     Assist level: Supervision/Verbal cueing Assistive device: Walker-rolling   Walk 50 feet activity   Assist Walk 50 feet with 2 turns activity did not occur: Safety/medical concerns  Assist level: Supervision/Verbal cueing Assistive device: Walker-rolling    Walk 150 feet activity   Assist Walk 150 feet activity did not occur: Safety/medical concerns  Assist level: Supervision/Verbal cueing Assistive device: Walker-rolling    Walk 10 feet on uneven surface  activity   Assist Walk 10 feet on uneven surfaces activity did not occur: Safety/medical concerns         Wheelchair     Assist Will patient use wheelchair at discharge?: No             Wheelchair 50 feet with 2 turns activity    Assist            Wheelchair 150 feet activity     Assist          Blood pressure (!) 153/96, pulse 100, temperature 98.1 F (36.7 C), resp. rate (!) 24, height 5\' 3"  (1.6 m), weight 90.3 kg, SpO2 94 %.  Medical Problem List and Plan: 1.  Decreased functional ability secondary to recurrent COPD exacerbation with  compression fracture T6.  Back brace when out of bed..  Continue nebulizer treatments as directed.  Prednisone tapered  Continue CIR 2.  Antithrombotics: -DVT/anticoagulation: Lovenox- d/c since ambulated 200 feet.              -antiplatelet therapy: N/A 3. Pain Management: Baclofen 10 mg 3 times daily, Lidoderm patch, hydrocodone as needed. Log roll to minimize back pain  Increased Norco to 5-10 mg q4 hours prn due to compression   Added ibuprofen 800mg  BID prn for breakthrough pain. Hgb anmd Cr normal  lidoderm patches to daytime 8am to 8pm to start in AM  kpad helping pain, esp at night- will con't regimen  9/14: waking at night for pain meds- switched from Norco to extended release Oxycontin.   9/15: this has really helped. Thoracic spine is stable.   9/16: well controlled 4. Mood: Lamictal 200 mg nightly, Effexor 299 mg daily Concerta 72 mg daily Xanax as needed. Has some anxiety.              -antipsychotic agents: Seroquel 150 mg nightly, Risperdal 3 mg nightly  Anxious regarding her breathing. Has been trying meditating and the Xanax has been helping.  5. Neuropsych: This patient is  capable of making decisions on her own behalf. 6. Skin/Wound Care: Routine skin checks 7. Fluids/Electrolytes/Nutrition: Routine in and outs. 8. Acute on chronic respiratory failure with hypoxia: continue to monitor saturations closely with therapy.  Wean oxygen as tolerated. - Pt had prn O2 at home, but didn't wear full time.  Continue to wean supplemental oxygen as tolerated, may need to go home on supplemental O2  9/13- hasn't been able to wean at ALL from 2L O2- was on at home prior- spoke with OT about checking if she needs it with exercise.   9/14: desatting to 80s with 1L O2, doing well with 2L O2  9/15: reassured that we will wean her off longterm. Advised meditation  9/16: Required 3L this morning due to increased SOB. CXR reviewed and without acute changes. Change nebulizer to q4H prn.  9.  Leukocytosis  WBCs 12.1 on 9/6, normal on 9/14  Afebrile 10.  Drug-induced constipation  Overall improving  LOS: 13 days A FACE TO FACE EVALUATION WAS PERFORMED  Martha Clan P Ennifer Harston 09/25/2019, 9:11 AM

## 2019-09-25 NOTE — Progress Notes (Signed)
Patient called RN to room to report feeling SOB and dizziness while being on 2L O2 [place].  Wheezing to bilateral upper lobes auscultated. On call provider notified, new orders placed and prn given early per order.  Will continue to monitor patient.

## 2019-09-26 ENCOUNTER — Inpatient Hospital Stay (HOSPITAL_COMMUNITY): Payer: BC Managed Care – PPO

## 2019-09-26 ENCOUNTER — Inpatient Hospital Stay (HOSPITAL_COMMUNITY): Payer: BC Managed Care – PPO | Admitting: Physical Therapy

## 2019-09-26 ENCOUNTER — Inpatient Hospital Stay (HOSPITAL_COMMUNITY): Payer: BC Managed Care – PPO | Admitting: Occupational Therapy

## 2019-09-26 LAB — CREATININE, SERUM
Creatinine, Ser: 0.68 mg/dL (ref 0.44–1.00)
GFR calc Af Amer: >60 mL/min (ref >60)
GFR calc non Af Amer: >60 mL/min (ref >60)

## 2019-09-26 MED ORDER — BUDESONIDE 0.5 MG/2ML IN SUSP
0.5000 mg | Freq: Two times a day (BID) | RESPIRATORY_TRACT | Status: DC
  Administered 2019-09-26 – 2019-09-28 (×5): 0.5 mg via RESPIRATORY_TRACT
  Filled 2019-09-26 (×5): qty 2

## 2019-09-26 MED ORDER — PREDNISONE 10 MG PO TABS
5.0000 mg | ORAL_TABLET | Freq: Two times a day (BID) | ORAL | Status: DC
  Administered 2019-09-26 – 2019-09-27 (×2): 5 mg via ORAL
  Filled 2019-09-26 (×2): qty 1

## 2019-09-26 MED ORDER — ALBUTEROL SULFATE (2.5 MG/3ML) 0.083% IN NEBU
INHALATION_SOLUTION | RESPIRATORY_TRACT | Status: AC
  Administered 2019-09-26: 5 mg via RESPIRATORY_TRACT
  Filled 2019-09-26: qty 3

## 2019-09-26 MED ORDER — HYDROCODONE-ACETAMINOPHEN 5-325 MG PO TABS
1.0000 | ORAL_TABLET | Freq: Every day | ORAL | Status: DC
  Administered 2019-09-26 – 2019-09-27 (×2): 1 via ORAL
  Filled 2019-09-26 (×3): qty 1

## 2019-09-26 MED ORDER — IPRATROPIUM BROMIDE 0.02 % IN SOLN
0.5000 mg | Freq: Two times a day (BID) | RESPIRATORY_TRACT | Status: DC
  Administered 2019-09-26 – 2019-09-28 (×5): 0.5 mg via RESPIRATORY_TRACT
  Filled 2019-09-26 (×6): qty 2.5

## 2019-09-26 MED ORDER — ALPRAZOLAM 0.25 MG PO TABS
0.2500 mg | ORAL_TABLET | Freq: Once | ORAL | Status: AC
  Administered 2019-09-26: 0.25 mg via ORAL
  Filled 2019-09-26: qty 1

## 2019-09-26 MED ORDER — HYDROCODONE-ACETAMINOPHEN 5-325 MG PO TABS
1.0000 | ORAL_TABLET | ORAL | Status: DC | PRN
  Administered 2019-09-26 – 2019-09-28 (×8): 1 via ORAL
  Filled 2019-09-26 (×7): qty 1

## 2019-09-26 MED ORDER — ALPRAZOLAM 0.25 MG PO TABS
0.2500 mg | ORAL_TABLET | Freq: Three times a day (TID) | ORAL | Status: DC | PRN
  Administered 2019-09-26 – 2019-09-28 (×5): 0.25 mg via ORAL
  Filled 2019-09-26 (×5): qty 1

## 2019-09-26 MED ORDER — ALBUTEROL SULFATE (2.5 MG/3ML) 0.083% IN NEBU
5.0000 mg | INHALATION_SOLUTION | RESPIRATORY_TRACT | Status: DC | PRN
  Administered 2019-09-26 – 2019-09-28 (×10): 5 mg via RESPIRATORY_TRACT
  Filled 2019-09-26 (×11): qty 6

## 2019-09-26 NOTE — Progress Notes (Signed)
Physical Therapy Session Note  Patient Details  Name: Julia Daniels MRN: [id number] Date of Birth: 08/02/1950  Today's Date: 09/26/2019  Pt with earlier episode requiring rapid response (see prior notes for details). Spoke with RN prior who approved and requested bed level session only. Pt resting in room on arrival, awakens easily to voice however despite max encouragement and efforts for simple bed level session, she continued to refuse. RN notified of efforts. Pt missed 60 minutes of skilled therapy.  Therapy Documentation Precautions:  Precautions Precautions: Fall Precaution Comments: new T6 comp Fx and COPD (home O2 at night/PRN day) Required Braces or Orthoses: Spinal Brace Spinal Brace: Thoracolumbosacral orthotic, Applied in sitting position Restrictions Weight Bearing Restrictions: No Other Position/Activity Restrictions: Don/doff EOB for self teaching General: PT Amount of Missed Time (min): 60 Minutes PT Missed Treatment Reason: Patient unwilling to participate;Patient ill (Comment) (Recent episodes of increased work of breathing with oxygen requirements. See nursing notes for details.)   Jone Baseman Syliva Mee PT 09/26/2019, 2:51 PM

## 2019-09-26 NOTE — Progress Notes (Signed)
[facility] PHYSICAL MEDICINE & REHABILITATION PROGRESS NOTE   Subjective/Complaints: Had rapid response this morning for tachypnea. Looks better now. Working on [employer] with OT. Discussed that oxycontin can cause respiratory depression. Will transition back to [place] and replace ibuprofen with prednisone to help breathing and pain  ROS: (+) SOB- since admission;   Pt denies abd pain, CP, N/V/C/D, and vision changes   Objective:   DG CHEST PORT 1 VIEW  Result Date: 09/25/2019 CLINICAL DATA:  Shortness of breath. EXAM: PORTABLE CHEST 1 VIEW COMPARISON:  09/08/2019 FINDINGS: The heart is mildly enlarged but stable. There is tortuosity and mild calcification of the thoracic aorta. No infiltrates or effusions. Streaky basilar scarring changes. IMPRESSION: No acute cardiopulmonary findings. Electronically Signed   By: Marijo Sanes M.D.   On: 09/25/2019 06:38   No results for input(s): WBC, HGB, HCT, PLT in the last 72 hours. Recent Labs    09/26/19 0612  CREATININE 0.68    Intake/Output Summary (Last 24 hours) at 09/26/2019 1124 Last data filed at 09/26/2019 0900 Gross per 24 hour  Intake 672 ml  Output --  Net 672 ml     Physical Exam: Vital Signs Blood pressure 135/90, pulse (!) 108, temperature 99 F (37.2 C), resp. rate 20, height 5\' 3"  (1.6 m), weight 90.3 kg, SpO2 97 %. General: Alert and oriented x 3, No apparent distress HEENT: Head is normocephalic, atraumatic, PERRLA, EOMI, sclera anicteric, oral mucosa pink and moist, dentition intact, ext ear canals clear,  Neck: Supple without JVD or lymphadenopathy Heart: Tachycardia. No murmurs rubs or gallops Chest: CTA bilaterally with wheezing, +tachypnea Abdomen: Soft, non-tender, non-distended, bowel sounds positive. Extremities: No clubbing, cyanosis, or edema. Pulses are 2+ Skin: Warm and dry.  Intact.  Scattered ecchymosis. Psych: Normal mood.  Normal behavior.  Musc: No edema in extremities.  No tenderness in  extremities. TTP in left thoracic paraspinal. Lidocaine patch is in place here. Difficulty log rolling, needs assistance.  Neuro: Alert Motor: 4/5 throughout, unchanged   Assessment/Plan: 1. Functional deficits secondary to Debility due to COPD exacerbation, prolonged hospitalization  which require 3+ hours per day of interdisciplinary therapy in a comprehensive inpatient rehab setting.  Physiatrist is providing close team supervision and 24 hour management of active medical problems listed below.  Physiatrist and rehab team continue to assess barriers to discharge/monitor patient progress toward functional and medical goals  Care Tool:  Bathing    Body parts bathed by patient: Chest, Abdomen, Face, Right arm, Left arm, Right upper leg, Left upper leg, Front perineal area, Buttocks, Right lower leg, Left lower leg   Body parts bathed by helper: Buttocks, Right lower leg, Left lower leg, Front perineal area     Bathing assist Assist Level: Contact Guard/Touching assist     Upper Body Dressing/Undressing Upper body dressing   What is the patient wearing?: Pull over shirt Orthosis activity level: Performed by helper  Upper body assist Assist Level: Moderate Assistance - Patient 50 - 74%    Lower Body Dressing/Undressing Lower body dressing      What is the patient wearing?: Pants, Underwear/pull up     Lower body assist Assist for lower body dressing: Minimal Assistance - Patient > 75%     Toileting Toileting    Toileting assist Assist for toileting: Supervision/Verbal cueing Assistive Device Comment:  (BSC)   Transfers Chair/bed transfer  Transfers assist     Chair/bed transfer assist level: Supervision/Verbal cueing Chair/bed transfer assistive device: Environmental consultant  Ambulation   Ambulation assist      Assist level: Supervision/Verbal cueing Assistive device: Walker-rolling Max distance: 48ft   Walk 10 feet activity   Assist     Assist  level: Supervision/Verbal cueing Assistive device: Walker-rolling   Walk 50 feet activity   Assist Walk 50 feet with 2 turns activity did not occur: Safety/medical concerns  Assist level: Supervision/Verbal cueing Assistive device: Walker-rolling    Walk 150 feet activity   Assist Walk 150 feet activity did not occur: Safety/medical concerns  Assist level: Supervision/Verbal cueing Assistive device: Walker-rolling    Walk 10 feet on uneven surface  activity   Assist Walk 10 feet on uneven surfaces activity did not occur: Safety/medical concerns         Wheelchair     Assist Will patient use wheelchair at discharge?: No             Wheelchair 50 feet with 2 turns activity    Assist            Wheelchair 150 feet activity     Assist          Blood pressure 135/90, pulse (!) 108, temperature 99 F (37.2 C), resp. rate 20, height 5\' 3"  (1.6 m), weight 90.3 kg, SpO2 97 %.  Medical Problem List and Plan: 1.  Decreased functional ability secondary to recurrent COPD exacerbation with compression fracture T6.  Back brace when out of bed..  Continue nebulizer treatments as directed.   Continue CIR 2.  Antithrombotics: -DVT/anticoagulation: Lovenox- d/c since ambulated 200 feet.              -antiplatelet therapy: N/A 3. Pain Management: Baclofen 10 mg 3 times daily, Lidoderm patch, hydrocodone as needed. Log roll to minimize back pain  Increased Norco to 5-10 mg q4 hours prn due to compression   lidoderm patches to daytime 8am to 8pm to start in AM  kpad helping pain, esp at night- will con't regimen  9/14: waking at night for pain meds- switched from Norco to extended release Oxycontin.   9/15: this has really helped. Thoracic spine is stable.   9/16: well controlled  9/17: pain has been better controlled but since oxycontin may be causing respiratory depression, will switch back to Norco. Will replace ibuprofen with BID prednisone as well.   4. Mood: Lamictal 200 mg nightly, Effexor 630 mg daily Concerta 72 mg daily Xanax as needed. Has some anxiety.              -antipsychotic agents: Seroquel 150 mg nightly, Risperdal 3 mg nightly  Anxious regarding her breathing. Has been trying meditating and the Xanax has been helping.   9/17: Appreciate medication incorporated into her therapy sessions.  5. Neuropsych: This patient is capable of making decisions on her own behalf. 6. Skin/Wound Care: Routine skin checks 7. Fluids/Electrolytes/Nutrition: Routine in and outs. 8. Acute on chronic respiratory failure with hypoxia: continue to monitor saturations closely with therapy.  Wean oxygen as tolerated. - Pt had prn O2 at home, but didn't wear full time.  Continue to wean supplemental oxygen as tolerated, may need to go home on supplemental O2  9/13- hasn't been able to wean at ALL from 2L O2- was on at home prior- spoke with OT about checking if she needs it with exercise.   9/14: desatting to 80s with 1L O2, doing well with 2L O2  9/15: reassured that we will wean her off longterm. Advised meditation  9/16: Required 3L  this morning due to increased SOB. CXR reviewed and without acute changes. Change nebulizer to q4H prn.   9/17: changed o2 order with saturation goals greater than 88 9. Leukocytosis  WBCs 12.1 on 9/6, normal on 9/14  Afebrile 10.  Drug-induced constipation  Overall improving  LOS: 14 days A FACE TO FACE EVALUATION WAS PERFORMED  Julia Daniels 09/26/2019, 11:24 AM

## 2019-09-26 NOTE — Progress Notes (Signed)
Patient called RN to room to report worsening SOB.  Expiratory wheezing auscultated and tachypnea noted.  PRN treatment not yet available.  RT notified, en route.

## 2019-09-26 NOTE — Progress Notes (Signed)
Patient ID: Julia Daniels, female   DOB: [birth year], 70 y.o.   MRN: [id number]  SW met with pt in room to provide updates on oxygen, and how [place] will bring a gauge to the hospital for current concentrator, and will schedule re-education at home on how to refill oxygen tank.   Loralee Pacas, MSW, [place] Office: [phone] Cell: [phone] Fax: [phone]

## 2019-09-26 NOTE — Progress Notes (Addendum)
Occupational Therapy Session Note  Patient Details  Name: Julia Daniels MRN: [id number] Date of Birth: 1950/12/25  Today's Date: 09/26/2019 OT Individual Time: 6213-0865 OT Individual Time Calculation (min): 55 min   Short Term Goals: Week 3:  OT Short Term Goal 2 (Week 3): STGs = LTGs d/t ELOS at MOD I/Supervision level  Skilled Therapeutic Interventions/Progress Updates:    Pt greeted in bed, reporting that she was unable to engage in any OOB therapy today due to difficultly breathing. She was satting at 97% at rest on supplemental 02. Pt reported having SOB and also back pain (5/10) at rest. Therefore therapeutic focus was placed on improving pain and breathing capabilities to increase her ability to participate in daily tasks. Educated pt on the role of her parasympathetic NS, emphasizing use of relaxation techniques to activate this system and improve respiration. OT led her through a guided meditation, focusing on diaphragmatic breathing techniques and progressive muscle relaxation. When the meditation ended, pt reported her pain was now rated at 4/5 and that she felt "less anxious." Education provided on using her electronic device to find guided relaxation/meditation videos when she was in the room and having SOB, increased feelings of anxiety, or trouble sleeping. Pt appreciative of coping strategy education. Pt left in care of RN at end of tx.    Therapy Documentation Precautions:  Precautions Precautions: Fall Precaution Comments: new T6 comp Fx and COPD (home O2 at night/PRN day) Required Braces or Orthoses: Spinal Brace Spinal Brace: Thoracolumbosacral orthotic, Applied in sitting position Restrictions Weight Bearing Restrictions: No Other Position/Activity Restrictions: Don/doff EOB for self teaching Vital Signs: Therapy Vitals Temp: 99 F (37.2 C) Pulse Rate: (!) 108 Resp: 20 BP: 135/90 Patient Position (if appropriate): Lying Oxygen Therapy SpO2: 97 % O2  Device: Room Air Pain: in back, pt premedicated and we placed k-pad at start of session Pain Assessment Pain Scale: 0-10 Pain Score: 4  Pain Type: Chronic pain Pain Location: Back Pain Orientation: Medial;Left Pain Descriptors / Indicators: Aching Pain Onset: On-going Patients Stated Pain Goal: 2 Pain Intervention(s): Heat applied;Repositioned;Other (Comment) (lidocain epatch) Multiple Pain Sites: No ADL: ADL Eating: Modified independent Grooming: Setup Where Assessed-Grooming: Sitting at sink Upper Body Bathing: Minimal assistance Where Assessed-Upper Body Bathing: Wheelchair, Sitting at sink Lower Body Bathing: Dependent Where Assessed-Lower Body Bathing: Wheelchair Upper Body Dressing: Maximal assistance Where Assessed-Upper Body Dressing: Sitting at sink, Wheelchair Lower Body Dressing: Dependent Where Assessed-Lower Body Dressing: Sitting at sink, Wheelchair     Therapy/Group: Individual Therapy  Julia Daniels 09/26/2019, 12:05 PM

## 2019-09-26 NOTE — Progress Notes (Signed)
Rapid response team notified of patient's increasing SOB.  RN at the bedside to assess patient.  Chest X-ray completed on 9/16, see results.  On call provider notified as well, see new orders.

## 2019-09-26 NOTE — Significant Event (Signed)
Rapid Response Event Note   Reason for Call : Progressive SOB with tachypnea Initial Focused Assessment:  Notified by nursing staff that pt was SOB and tachypneic RR 30s. RRT Glena Norfolk at bedside and providing neb treatment. Upon arrival, Julia Daniels is obese and in moderate distress with some accessory muscle use. She is alert, oriented x4 and able to speak in 2-3 word phrases. Hx of COPD/asthma/anxiety and ongoing SOB,wheezing. Probable OSA. BBS with minimal wheezing in lung fields, but expiratory wheeze in her upper airway from forced exhalation. She does endorse SOB. No cough. Skin warm, pink, and dry. Generalized edema. Pt stated she does feel better at the completion of her neb treatment. Similar event yesterday morning with CXR and neb tx. No acute findings on CXR.   0520- 98.4F, HR 108, BP 130/79, RR 30 with sats 96% on 2L [place]  Interventions:  - Albuterol 5mg  Neb per RRT  -No acute RRRN interventions  Plan of Care:  -Purse lipped breathing during SOB episodes  -Notify primary svc and/or RRRN for further assistance    MD Notified: Silverio Lay PA @ [phone] per nursing staff Call Time: [place] Time: 0500 End Time: [place]  Julia Done, RN

## 2019-09-26 NOTE — Progress Notes (Signed)
Physical Therapy Note  Patient Details  Name: Julia Daniels MRN: [id number] Date of Birth: 03-Mar-1950 Today's Date: 09/26/2019   MISSED TIME: 60 minutes REASON: increased work of breathing, poor tolerance to any activity.  Per chart review, pt required Rapid Response care early this AM for respiratory needs. PT spoke with pt's nurse and reported pt has required additional needs for breathing and care this morning, medical team aware and not appropriate for OOB activity at this time. PT went to pt's bedside and pt sleeping with noted continued increase work of breathing at rest and did not wake to name or light stimuli. Nursing reported she has been fatigued. Pt left in bed as found, nursing updated from this PT on no activity with PT for session and agreeable.   [name] 09/26/2019, 12:00 PM

## 2019-09-26 NOTE — Progress Notes (Addendum)
RT called to bedside to assess pt for PRN neb tx. Upon RT's arrival, pt in acute respiratory distress, audible wheeze, RR 44, HR 117, SpO2 89% on 3L [place], diminished bbs, with upper airway wheeze. O2 increased for pt's comfort while neb tx was pulled from pyxis. Albuterol 5mg  was given to pt Reesa Chew, PA-C notified by RN), and PRN order was changed to reflect the same. Pt states post tx, she feels a little better, but pt still appears to be uncomfortable. Dulera placed on hold for today per Reesa Chew, PA-C, okay to resume 09/27/19 at 0800. RT will continue to monitor.

## 2019-09-26 NOTE — Plan of Care (Signed)
°  Problem: Consults Goal: RH GENERAL PATIENT EDUCATION Description: See Patient Education module for education specifics. Outcome: Progressing Goal: Skin Care Protocol Initiated - if Braden Score 18 or less Description: If consults are not indicated, leave blank or document N/A Outcome: Progressing Goal: Diabetes Guidelines if Diabetic/Glucose > 140 Description: If diabetic or lab glucose is > 140 mg/dl - Initiate Diabetes/Hyperglycemia Guidelines & Document Interventions  Outcome: Progressing   Problem: RH BOWEL ELIMINATION Goal: RH STG MANAGE BOWEL WITH ASSISTANCE Description: STG Manage Bowel with min Assistance. Outcome: Progressing Goal: RH STG MANAGE BOWEL W/MEDICATION W/ASSISTANCE Description: STG Manage Bowel with Medication with min Assistance. Outcome: Progressing   Problem: RH BLADDER ELIMINATION Goal: RH STG MANAGE BLADDER WITH ASSISTANCE Description: STG Manage Bladder With min Assistance Outcome: Progressing Goal: RH STG MANAGE BLADDER WITH EQUIPMENT WITH ASSISTANCE Description: STG Manage Bladder With Equipment With min Assistance Outcome: Progressing   Problem: RH SKIN INTEGRITY Goal: RH STG SKIN FREE OF INFECTION/BREAKDOWN Description: Skin will be free of infection/breakdown with min assist Outcome: Progressing Goal: RH STG ABLE TO PERFORM INCISION/WOUND CARE W/ASSISTANCE Description: STG Able To Perform Incision/Wound Care With min Assistance. Outcome: Progressing   Problem: RH SAFETY Goal: RH STG ADHERE TO SAFETY PRECAUTIONS W/ASSISTANCE/DEVICE Description: STG Adhere to Safety Precautions With min Assistance/Device. Outcome: Progressing   Problem: RH PAIN MANAGEMENT Goal: RH STG PAIN MANAGED AT OR BELOW PT'S PAIN GOAL Description: Pain will be managed at or below a 3 out of 10 on pain scale with min assist Outcome: Progressing   Problem: RH KNOWLEDGE DEFICIT GENERAL Goal: RH STG INCREASE KNOWLEDGE OF SELF CARE AFTER HOSPITALIZATION Description: Pt  will be able to verbalize and preform self care prior to discharge with min assist  Outcome: Progressing

## 2019-09-26 NOTE — Progress Notes (Signed)
   09/26/19 0504  Assess: MEWS Score  Temp (!) 97.5 F (36.4 C)  BP 115/83  Pulse Rate (!) 110  Resp (!) 30 (shallow breathing)  Level of Consciousness Alert  SpO2 98 %  O2 Device Non-rebreather Mask  O2 Flow Rate (L/min) 9 L/min  Assess: MEWS Score  MEWS Temp 0  MEWS Systolic 0  MEWS Pulse 1  MEWS RR 2  MEWS LOC 0  MEWS Score 3  MEWS Score Color Yellow  Assess: if the MEWS score is Yellow or Red  Were vital signs taken at a resting state? Yes  Focused Assessment Change from prior assessment (see assessment flowsheet)  Early Detection of Sepsis Score *See Row Information* Low  MEWS guidelines implemented *See Row Information* Yes  Treat  MEWS Interventions Administered prn meds/treatments;Escalated (See documentation below);Consulted Respiratory Therapy  Pain Scale  (Simultaneous filing. User may not have seen previous data.)  Pain Score 7  Pain Type Chronic pain  Pain Location Back  Pain Orientation Mid  Pain Descriptors / Indicators Constant;Discomfort  Pain Frequency Constant  Pain Onset On-going  Pain Intervention(s) RN made aware  Complains of Anxiety  Neuro symptoms relieved by Anti-anxiety medication  Take Vital Signs  Increase Vital Sign Frequency  Yellow: Q 2hr X 2 then Q 4hr X 2, if remains yellow, continue Q 4hrs  Escalate  MEWS: Escalate Yellow: discuss with charge nurse/RN and consider discussing with provider and RRT  Notify: Charge Nurse/RN  Name of Charge Nurse/RN Notified Natale Lay  Date Charge Nurse/RN Notified 09/26/19  Time Charge Nurse/RN Notified 0500  Notify: Provider  Provider Name/Title Reesa Chew  Date Provider Notified 09/26/19  Time Provider Notified 0507  Notification Type Call  Notification Reason Change in status  Response See new orders  Date of Provider Response 09/26/19  Time of Provider Response 0507  Notify: Rapid Response  Name of Rapid Response RN Notified Irma Newness  Date Rapid Response Notified 09/26/19  Time  Rapid Response Notified 0457

## 2019-09-27 ENCOUNTER — Inpatient Hospital Stay (HOSPITAL_COMMUNITY): Payer: BC Managed Care – PPO | Admitting: Occupational Therapy

## 2019-09-27 ENCOUNTER — Inpatient Hospital Stay (HOSPITAL_COMMUNITY): Payer: BC Managed Care – PPO | Admitting: Physical Therapy

## 2019-09-27 MED ORDER — ALBUTEROL SULFATE (2.5 MG/3ML) 0.083% IN NEBU
INHALATION_SOLUTION | RESPIRATORY_TRACT | Status: AC
  Filled 2019-09-27: qty 3

## 2019-09-27 MED ORDER — PREDNISONE 20 MG PO TABS
30.0000 mg | ORAL_TABLET | Freq: Every day | ORAL | Status: DC
  Administered 2019-09-27 – 2019-09-28 (×2): 30 mg via ORAL
  Filled 2019-09-27 (×2): qty 1

## 2019-09-27 NOTE — Progress Notes (Signed)
Occupational Therapy Session Note  Patient Details  Name: Julia Daniels MRN: [id number] Date of Birth: October 26, 1950  Today's Date: 09/27/2019 OT Individual Time: 0900-1001 OT Individual Time Calculation (min): 61 min   Short Term Goals: Week 3:  OT Short Term Goal 2 (Week 3): STGs = LTGs d/t ELOS at MOD I/Supervision level  Skilled Therapeutic Interventions/Progress Updates:    Pt greeted in bed, feeling better and wanting to participate in tx today. Per RN, no therapy restrictions. Supine<sit completed without assistance. She then engaged in bathing/dressing tasks EOB, sit<stand using RW. Pt required multiple rest breaks due to SOB, vcs for stopping to rest due to her tendency to participate with a fast pace. CGA for sit<stands and for dynamic standing balance (after her TLSO was donned). Pt able to stand for <10 second windows before needing to sit down. Reacher and LH sponge used for conserving energy while meeting demands of self care tasks. 02 sats throughout ADL, even post stands remained between 94-97% on supplemental 02. When she returned to bed with Mod A, pt with increased HR (148bpm and satting in the 70s). After a minute or so of breathing sats increased to 91% and HR decreased into the 50s. Pt still with s/s anxiousness and requesting her breathing tx. Left pt in bed with all needs within reach and bed alarm set. RN notified of her request for breathing tx.   Therapy Documentation Precautions:  Precautions Precautions: Fall Precaution Comments: new T6 comp Fx and COPD (home O2 at night/PRN day) Required Braces or Orthoses: Spinal Brace Spinal Brace: Thoracolumbosacral orthotic, Applied in sitting position Restrictions Weight Bearing Restrictions: No Other Position/Activity Restrictions: Don/doff EOB for self teaching Vital Signs: Therapy Vitals Temp: 97.6 F (36.4 C) Temp Source: Axillary Pulse Rate: 82 Resp: 20 BP: (!) 135/109 Patient Position (if appropriate):  Lying Oxygen Therapy SpO2: 91 % O2 Device: Nasal Cannula Pain: Pain Assessment Pain Scale: 0-10 Pain Score: 6  Pain Type: Surgical pain Pain Location: Back Pain Orientation: Lower Pain Descriptors / Indicators: Aching Pain Frequency: Constant Pain Onset: On-going Patients Stated Pain Goal: 2 Pain Intervention(s): Medication (See eMAR) ADL: ADL Eating: Modified independent Grooming: Setup Where Assessed-Grooming: Sitting at sink Upper Body Bathing: Minimal assistance Where Assessed-Upper Body Bathing: Wheelchair, Sitting at sink Lower Body Bathing: Dependent Where Assessed-Lower Body Bathing: Wheelchair Upper Body Dressing: Maximal assistance Where Assessed-Upper Body Dressing: Sitting at sink, Wheelchair Lower Body Dressing: Dependent Where Assessed-Lower Body Dressing: Sitting at sink, Wheelchair :     Therapy/Group: Individual Therapy  Mariella Blackwelder A Celestine Prim 09/27/2019, 12:37 PM

## 2019-09-27 NOTE — Progress Notes (Signed)
[facility] PHYSICAL MEDICINE & REHABILITATION PROGRESS NOTE   Subjective/Complaints: + tachypnea but sats are 88% on 2L [place] Received pulmicort, atrovent  and albuterol nebs this am  CXR yesterday without infiltrate   ROS: (+) SOB- since admission;   Pt denies abd pain, CP, N/V/C/D, and vision changes   Objective:   No results found. No results for input(s): WBC, HGB, HCT, PLT in the last 72 hours. Recent Labs    09/26/19 0612  CREATININE 0.68    Intake/Output Summary (Last 24 hours) at 09/27/2019 0823 Last data filed at 09/26/2019 2300 Gross per 24 hour  Intake 442 ml  Output --  Net 442 ml     Physical Exam: Vital Signs Blood pressure 104/64, pulse 76, temperature 98.5 F (36.9 C), resp. rate 19, height 5\' 3"  (1.6 m), weight 90.3 kg, SpO2 (!) 88 %.  General: No acute distress Mood and affect are appropriate Heart: Regular rate and rhythm no rubs murmurs or extra sounds Lungs: Clear to auscultation, mildly labored,+wheezes, accessory muscle use Abdomen: Positive bowel sounds, soft nontender to palpation, nondistended Extremities: No clubbing, cyanosis, or edema Skin: No evidence of breakdown, no evidence of rash  Musculoskeletal: Full range of motion in all 4 extremities. No joint swelling Musc: No edema in extremities.  No tenderness in extremities. TTP in left thoracic paraspinal. Lidocaine patch is in place here. Difficulty log rolling, needs assistance.  Neuro: Alert Motor: 4/5 throughout, unchanged   Assessment/Plan: 1. Functional deficits secondary to Debility due to COPD exacerbation, prolonged hospitalization  which require 3+ hours per day of interdisciplinary therapy in a comprehensive inpatient rehab setting.  Physiatrist is providing close team supervision and 24 hour management of active medical problems listed below.  Physiatrist and rehab team continue to assess barriers to discharge/monitor patient progress toward functional and medical  goals  Care Tool:  Bathing    Body parts bathed by patient: Chest, Abdomen, Face, Right arm, Left arm, Right upper leg, Left upper leg, Front perineal area, Buttocks, Right lower leg, Left lower leg   Body parts bathed by helper: Buttocks, Right lower leg, Left lower leg, Front perineal area     Bathing assist Assist Level: Contact Guard/Touching assist     Upper Body Dressing/Undressing Upper body dressing   What is the patient wearing?: Pull over shirt Orthosis activity level: Performed by helper  Upper body assist Assist Level: Moderate Assistance - Patient 50 - 74%    Lower Body Dressing/Undressing Lower body dressing      What is the patient wearing?: Pants, Underwear/pull up     Lower body assist Assist for lower body dressing: Minimal Assistance - Patient > 75%     Toileting Toileting    Toileting assist Assist for toileting: Supervision/Verbal cueing Assistive Device Comment:  (BSC)   Transfers Chair/bed transfer  Transfers assist     Chair/bed transfer assist level: Supervision/Verbal cueing Chair/bed transfer assistive device: Programmer, multimedia   Ambulation assist      Assist level: Supervision/Verbal cueing Assistive device: Walker-rolling Max distance: 4ft   Walk 10 feet activity   Assist     Assist level: Supervision/Verbal cueing Assistive device: Walker-rolling   Walk 50 feet activity   Assist Walk 50 feet with 2 turns activity did not occur: Safety/medical concerns  Assist level: Supervision/Verbal cueing Assistive device: Walker-rolling    Walk 150 feet activity   Assist Walk 150 feet activity did not occur: Safety/medical concerns  Assist level: Supervision/Verbal cueing Assistive device:  Walker-rolling    Walk 10 feet on uneven surface  activity   Assist Walk 10 feet on uneven surfaces activity did not occur: Safety/medical concerns         Wheelchair     Assist Will patient use  wheelchair at discharge?: No             Wheelchair 50 feet with 2 turns activity    Assist            Wheelchair 150 feet activity     Assist          Blood pressure 104/64, pulse 76, temperature 98.5 F (36.9 C), resp. rate 19, height 5\' 3"  (1.6 m), weight 90.3 kg, SpO2 (!) 88 %.  Medical Problem List and Plan: 1.  Decreased functional ability secondary to recurrent COPD exacerbation with compression fracture T6.  Back brace when out of bed..  Continue nebulizer treatments as directed.   Continue CIR PT, OT  2.  Antithrombotics: -DVT/anticoagulation: Lovenox- d/c since ambulated 200 feet.              -antiplatelet therapy: N/A 3. Pain Management: Baclofen 10 mg 3 times daily, Lidoderm patch, hydrocodone as needed. Log roll to minimize back pain  Increased Norco to 5-10 mg q4 hours prn due to compression   lidoderm patches to daytime 8am to 8pm to start in AM  kpad helping pain, esp at night- will con't regimen  9/14: waking at night for pain meds- switched from Norco to extended release Oxycontin.   9/15: this has really helped. Thoracic spine is stable.   9/16: well controlled  9/17: pain has been better controlled but since oxycontin may be causing respiratory depression, will switch back to Norco. Will replace ibuprofen with BID prednisone as well.  4. Mood: Lamictal 200 mg nightly, Effexor 250 mg daily Concerta 72 mg daily Xanax as needed. Has some anxiety.              -antipsychotic agents: Seroquel 150 mg nightly, Risperdal 3 mg nightly  Anxious regarding her breathing. Has been trying meditating and the Xanax has been helping.   9/17: Appreciate medication incorporated into her therapy sessions.  5. Neuropsych: This patient is capable of making decisions on her own behalf. 6. Skin/Wound Care: Routine skin checks 7. Fluids/Electrolytes/Nutrition: Routine in and outs. 8. Acute on chronic respiratory failure with hypoxia: continue to monitor saturations  closely with therapy.  Wean oxygen as tolerated. - Pt had prn O2 at home, but didn't wear full time.  Continue to wean supplemental oxygen as tolerated, may need to go home on supplemental O2  9/13- hasn't been able to wean at ALL from 2L O2- was on at home prior- spoke with OT about checking if she needs it with exercise.   9/14: desatting to 80s with 1L O2, doing well with 2L O2  9/15: reassured that we will wean her off longterm. Advised meditation  9/16: Required 3L this morning due to increased SOB. CXR reviewed and without acute changes. Change nebulizer to q4H prn.   9/17: changed o2 order with saturation goals greater than 88  Will need to increase prednisone (had been weaned quickly off from 40mg ) currently on 5mg  will increase to 30mg  per day and slowly taper 9. Leukocytosis  WBCs 12.1 on 9/6, normal on 9/14  Afebrile 10.  Drug-induced constipation  Overall improving  LOS: 15 days A FACE TO FACE EVALUATION WAS PERFORMED  Charlett Blake 09/27/2019, 8:23 AM

## 2019-09-27 NOTE — Progress Notes (Signed)
Physical Therapy Session Note  Patient Details  Name: Julia Daniels MRN: [id number] Date of Birth: 01-21-1950  Today's Date: 09/27/2019 PT Missed Time: 58 Minutes Missed Time Reason: Other (Comment) (labored breathing)    Pt received in bed supported into sitting upright with HOB maximally elevated, wearing TLSO, with Douda, RN present administering a breathing treatment and pt having labored breathing. Pt states "I don't think I can do any more today" and RN recommends to wait until after all breathing medications have been administered. Pt left in the care of RN with therapist to attempt at a later time.  Therapist returned to pt's room and she was supine in bed with HOB elevated in supported sitting. Pt's breathing was much more calm. She states she is receiving her medications every 4 hours, which is helping but she feels that if she does any mobility it will cause her breathing to "shoot back up" (meaning it will become more labored). Pt says she feels that she becomes SOB, which results in increased anxiety and further SOB making it really hard to catch her breath and settle her breathing. Pt very apologetic but states that she does not feel up to performing any therapy at this time. Therapist provided emotional support throughout and educated on goals of therapy. Missed 45 minutes of skilled physical therapy.   Tawana Scale , PT, DPT, CSRS  09/27/2019, 1:01 PM

## 2019-09-27 NOTE — Progress Notes (Signed)
Patient complained of shortness of breath at rest throughout the night relieved with albuterol neb PRN; had 3 doses total during night shift.

## 2019-09-28 ENCOUNTER — Inpatient Hospital Stay (HOSPITAL_COMMUNITY): Payer: BC Managed Care – PPO

## 2019-09-28 ENCOUNTER — Inpatient Hospital Stay (HOSPITAL_COMMUNITY)
Admission: AD | Admit: 2019-09-28 | Discharge: 2019-10-07 | DRG: 190 | Disposition: A | Discharge status: Skilled Nursing Facility | Payer: BC Managed Care – PPO | Source: Ambulatory Visit | Attending: Internal Medicine | Admitting: Internal Medicine

## 2019-09-28 DIAGNOSIS — Z87891 Personal history of nicotine dependence: Secondary | ICD-10-CM | POA: Diagnosis not present

## 2019-09-28 DIAGNOSIS — F339 Major depressive disorder, recurrent, unspecified: Secondary | ICD-10-CM | POA: Diagnosis not present

## 2019-09-28 DIAGNOSIS — Z20822 Contact with and (suspected) exposure to covid-19: Secondary | ICD-10-CM | POA: Diagnosis present

## 2019-09-28 DIAGNOSIS — F329 Major depressive disorder, single episode, unspecified: Secondary | ICD-10-CM

## 2019-09-28 DIAGNOSIS — K219 Gastro-esophageal reflux disease without esophagitis: Secondary | ICD-10-CM | POA: Diagnosis present

## 2019-09-28 DIAGNOSIS — F319 Bipolar disorder, unspecified: Secondary | ICD-10-CM | POA: Diagnosis not present

## 2019-09-28 DIAGNOSIS — J9622 Acute and chronic respiratory failure with hypercapnia: Secondary | ICD-10-CM | POA: Diagnosis not present

## 2019-09-28 DIAGNOSIS — Z7951 Long term (current) use of inhaled steroids: Secondary | ICD-10-CM

## 2019-09-28 DIAGNOSIS — Z9049 Acquired absence of other specified parts of digestive tract: Secondary | ICD-10-CM

## 2019-09-28 DIAGNOSIS — Z515 Encounter for palliative care: Secondary | ICD-10-CM | POA: Diagnosis not present

## 2019-09-28 DIAGNOSIS — Z825 Family history of asthma and other chronic lower respiratory diseases: Secondary | ICD-10-CM | POA: Diagnosis not present

## 2019-09-28 DIAGNOSIS — J9621 Acute and chronic respiratory failure with hypoxia: Secondary | ICD-10-CM | POA: Diagnosis not present

## 2019-09-28 DIAGNOSIS — Z7189 Other specified counseling: Secondary | ICD-10-CM | POA: Diagnosis not present

## 2019-09-28 DIAGNOSIS — D539 Nutritional anemia, unspecified: Secondary | ICD-10-CM | POA: Diagnosis present

## 2019-09-28 DIAGNOSIS — G8929 Other chronic pain: Secondary | ICD-10-CM | POA: Diagnosis not present

## 2019-09-28 DIAGNOSIS — R5381 Other malaise: Secondary | ICD-10-CM | POA: Diagnosis not present

## 2019-09-28 DIAGNOSIS — R0789 Other chest pain: Secondary | ICD-10-CM | POA: Diagnosis present

## 2019-09-28 DIAGNOSIS — F419 Anxiety disorder, unspecified: Secondary | ICD-10-CM | POA: Diagnosis present

## 2019-09-28 DIAGNOSIS — J9601 Acute respiratory failure with hypoxia: Secondary | ICD-10-CM

## 2019-09-28 DIAGNOSIS — J9811 Atelectasis: Secondary | ICD-10-CM | POA: Diagnosis not present

## 2019-09-28 DIAGNOSIS — E785 Hyperlipidemia, unspecified: Secondary | ICD-10-CM | POA: Diagnosis not present

## 2019-09-28 DIAGNOSIS — R0602 Shortness of breath: Secondary | ICD-10-CM | POA: Diagnosis not present

## 2019-09-28 DIAGNOSIS — F32A Depression, unspecified: Secondary | ICD-10-CM | POA: Diagnosis present

## 2019-09-28 DIAGNOSIS — Z66 Do not resuscitate: Secondary | ICD-10-CM | POA: Diagnosis present

## 2019-09-28 DIAGNOSIS — J441 Chronic obstructive pulmonary disease with (acute) exacerbation: Secondary | ICD-10-CM | POA: Diagnosis not present

## 2019-09-28 DIAGNOSIS — Z79899 Other long term (current) drug therapy: Secondary | ICD-10-CM

## 2019-09-28 DIAGNOSIS — J9602 Acute respiratory failure with hypercapnia: Secondary | ICD-10-CM

## 2019-09-28 DIAGNOSIS — Z882 Allergy status to sulfonamides status: Secondary | ICD-10-CM | POA: Diagnosis not present

## 2019-09-28 DIAGNOSIS — Z9981 Dependence on supplemental oxygen: Secondary | ICD-10-CM | POA: Diagnosis not present

## 2019-09-28 DIAGNOSIS — J90 Pleural effusion, not elsewhere classified: Secondary | ICD-10-CM | POA: Diagnosis not present

## 2019-09-28 DIAGNOSIS — R069 Unspecified abnormalities of breathing: Secondary | ICD-10-CM

## 2019-09-28 DIAGNOSIS — M351 Other overlap syndromes: Secondary | ICD-10-CM | POA: Diagnosis not present

## 2019-09-28 DIAGNOSIS — J9600 Acute respiratory failure, unspecified whether with hypoxia or hypercapnia: Secondary | ICD-10-CM | POA: Diagnosis not present

## 2019-09-28 DIAGNOSIS — F064 Anxiety disorder due to known physiological condition: Secondary | ICD-10-CM | POA: Diagnosis present

## 2019-09-28 DIAGNOSIS — J439 Emphysema, unspecified: Secondary | ICD-10-CM | POA: Diagnosis not present

## 2019-09-28 DIAGNOSIS — Z6835 Body mass index (BMI) 35.0-35.9, adult: Secondary | ICD-10-CM | POA: Diagnosis not present

## 2019-09-28 DIAGNOSIS — Z743 Need for continuous supervision: Secondary | ICD-10-CM | POA: Diagnosis not present

## 2019-09-28 DIAGNOSIS — J9620 Acute and chronic respiratory failure, unspecified whether with hypoxia or hypercapnia: Secondary | ICD-10-CM | POA: Diagnosis not present

## 2019-09-28 DIAGNOSIS — R279 Unspecified lack of coordination: Secondary | ICD-10-CM | POA: Diagnosis not present

## 2019-09-28 DIAGNOSIS — K5900 Constipation, unspecified: Secondary | ICD-10-CM | POA: Diagnosis not present

## 2019-09-28 DIAGNOSIS — M6281 Muscle weakness (generalized): Secondary | ICD-10-CM | POA: Diagnosis not present

## 2019-09-28 DIAGNOSIS — Z90710 Acquired absence of both cervix and uterus: Secondary | ICD-10-CM | POA: Diagnosis not present

## 2019-09-28 DIAGNOSIS — M4854XA Collapsed vertebra, not elsewhere classified, thoracic region, initial encounter for fracture: Secondary | ICD-10-CM | POA: Diagnosis not present

## 2019-09-28 LAB — CBC WITH DIFFERENTIAL/PLATELET
Abs Immature Granulocytes: 0.1 10*3/uL — ABNORMAL HIGH (ref 0.00–0.07)
Basophils Absolute: 0 10*3/uL (ref 0.0–0.1)
Basophils Relative: 0 %
Eosinophils Absolute: 0 10*3/uL (ref 0.0–0.5)
Eosinophils Relative: 0 %
HCT: 36.2 % (ref 36.0–46.0)
Hemoglobin: 11 g/dL — ABNORMAL LOW (ref 12.0–15.0)
Immature Granulocytes: 1 %
Lymphocytes Relative: 4 %
Lymphs Abs: 0.4 10*3/uL — ABNORMAL LOW (ref 0.7–4.0)
MCH: 31.4 pg (ref 26.0–34.0)
MCHC: 30.4 g/dL (ref 30.0–36.0)
MCV: 103.4 fL — ABNORMAL HIGH (ref 80.0–100.0)
Monocytes Absolute: 0.2 10*3/uL (ref 0.1–1.0)
Monocytes Relative: 2 %
Neutro Abs: 10.6 10*3/uL — ABNORMAL HIGH (ref 1.7–7.7)
Neutrophils Relative %: 93 %
Platelets: 307 10*3/uL (ref 150–400)
RBC: 3.5 MIL/uL — ABNORMAL LOW (ref 3.87–5.11)
RDW: 13.3 % (ref 11.5–15.5)
WBC: 11.3 10*3/uL — ABNORMAL HIGH (ref 4.0–10.5)
nRBC: 0 % (ref 0.0–0.2)

## 2019-09-28 LAB — POCT I-STAT 7, (LYTES, BLD GAS, ICA,H+H)
Acid-Base Excess: 12 mmol/L — ABNORMAL HIGH (ref 0.0–2.0)
Bicarbonate: 40 mmol/L — ABNORMAL HIGH (ref 20.0–28.0)
Calcium, Ion: 1.35 mmol/L (ref 1.15–1.40)
HCT: 35 % — ABNORMAL LOW (ref 36.0–46.0)
Hemoglobin: 11.9 g/dL — ABNORMAL LOW (ref 12.0–15.0)
O2 Saturation: 100 %
Patient temperature: 99.8
Potassium: 4.1 mmol/L (ref 3.5–5.1)
Sodium: 139 mmol/L (ref 135–145)
TCO2: 42 mmol/L — ABNORMAL HIGH (ref 22–32)
pCO2 arterial: 74.7 mmHg (ref 32.0–48.0)
pH, Arterial: 7.34 — ABNORMAL LOW (ref 7.350–7.450)
pO2, Arterial: 464 mmHg — ABNORMAL HIGH (ref 83.0–108.0)

## 2019-09-28 LAB — BRAIN NATRIURETIC PEPTIDE: B Natriuretic Peptide: 176.8 pg/mL — ABNORMAL HIGH (ref 0.0–100.0)

## 2019-09-28 LAB — GLUCOSE, CAPILLARY
Glucose-Capillary: 120 mg/dL — ABNORMAL HIGH (ref 70–99)
Glucose-Capillary: 134 mg/dL — ABNORMAL HIGH (ref 70–99)

## 2019-09-28 MED ORDER — SENNOSIDES-DOCUSATE SODIUM 8.6-50 MG PO TABS
1.0000 | ORAL_TABLET | Freq: Two times a day (BID) | ORAL | Status: DC
  Administered 2019-09-28 – 2019-10-07 (×10): 1 via ORAL
  Filled 2019-09-28 (×14): qty 1

## 2019-09-28 MED ORDER — RISPERIDONE 3 MG PO TABS
3.0000 mg | ORAL_TABLET | Freq: Every day | ORAL | Status: DC
  Administered 2019-09-28 – 2019-10-07 (×8): 3 mg via ORAL
  Filled 2019-09-28 (×2): qty 1
  Filled 2019-09-28: qty 6
  Filled 2019-09-28: qty 1
  Filled 2019-09-28: qty 6
  Filled 2019-09-28 (×6): qty 1

## 2019-09-28 MED ORDER — PANTOPRAZOLE SODIUM 40 MG PO TBEC
40.0000 mg | DELAYED_RELEASE_TABLET | Freq: Every day | ORAL | Status: DC
  Administered 2019-09-29 – 2019-10-07 (×8): 40 mg via ORAL
  Filled 2019-09-28 (×8): qty 1

## 2019-09-28 MED ORDER — ORAL CARE MOUTH RINSE
15.0000 mL | Freq: Two times a day (BID) | OROMUCOSAL | Status: DC
  Administered 2019-09-29 – 2019-10-07 (×11): 15 mL via OROMUCOSAL

## 2019-09-28 MED ORDER — ALPRAZOLAM 0.25 MG PO TABS
0.2500 mg | ORAL_TABLET | Freq: Every day | ORAL | Status: DC | PRN
  Administered 2019-09-28 – 2019-09-29 (×2): 0.25 mg via ORAL
  Filled 2019-09-28 (×2): qty 1

## 2019-09-28 MED ORDER — FAMOTIDINE 20 MG PO TABS
20.0000 mg | ORAL_TABLET | Freq: Every day | ORAL | Status: DC
  Administered 2019-09-28 – 2019-10-07 (×8): 20 mg via ORAL
  Filled 2019-09-28 (×8): qty 1

## 2019-09-28 MED ORDER — ENOXAPARIN SODIUM 40 MG/0.4ML ~~LOC~~ SOLN
40.0000 mg | SUBCUTANEOUS | Status: DC
  Administered 2019-09-28 – 2019-10-07 (×10): 40 mg via SUBCUTANEOUS
  Filled 2019-09-28 (×10): qty 0.4

## 2019-09-28 MED ORDER — MUSCLE RUB 10-15 % EX CREA
TOPICAL_CREAM | CUTANEOUS | Status: DC | PRN
  Filled 2019-09-28: qty 85

## 2019-09-28 MED ORDER — ATORVASTATIN CALCIUM 40 MG PO TABS
40.0000 mg | ORAL_TABLET | Freq: Every day | ORAL | Status: DC
  Administered 2019-09-29 – 2019-10-07 (×8): 40 mg via ORAL
  Filled 2019-09-28 (×8): qty 1

## 2019-09-28 MED ORDER — MONTELUKAST SODIUM 10 MG PO TABS
10.0000 mg | ORAL_TABLET | Freq: Every day | ORAL | Status: DC
  Administered 2019-09-28 – 2019-10-07 (×8): 10 mg via ORAL
  Filled 2019-09-28 (×10): qty 1

## 2019-09-28 MED ORDER — METHYLPHENIDATE HCL ER (OSM) 18 MG PO TBCR: 18.0000 mg | EXTENDED_RELEASE_TABLET | Freq: Every day | ORAL | Status: DC

## 2019-09-28 MED ORDER — CHLORHEXIDINE GLUCONATE 0.12 % MT SOLN
15.0000 mL | Freq: Two times a day (BID) | OROMUCOSAL | Status: DC
  Administered 2019-09-29 – 2019-10-07 (×11): 15 mL via OROMUCOSAL
  Filled 2019-09-28 (×9): qty 15

## 2019-09-28 MED ORDER — DOCUSATE SODIUM 100 MG PO CAPS: 100.0000 mg | ORAL_CAPSULE | Freq: Two times a day (BID) | ORAL | Status: DC | PRN

## 2019-09-28 MED ORDER — REVEFENACIN 175 MCG/3ML IN SOLN
175.0000 ug | Freq: Every day | RESPIRATORY_TRACT | Status: DC
  Administered 2019-09-28 – 2019-10-07 (×12): 175 ug via RESPIRATORY_TRACT
  Filled 2019-09-28 (×10): qty 3

## 2019-09-28 MED ORDER — BUDESONIDE 0.25 MG/2ML IN SUSP
0.2500 mg | Freq: Two times a day (BID) | RESPIRATORY_TRACT | Status: DC
  Administered 2019-09-28 – 2019-10-01 (×7): 0.25 mg via RESPIRATORY_TRACT
  Filled 2019-09-28 (×7): qty 2

## 2019-09-28 MED ORDER — LIDOCAINE 5 % EX PTCH
1.0000 | MEDICATED_PATCH | CUTANEOUS | Status: DC
  Administered 2019-09-29 – 2019-10-07 (×8): 1 via TRANSDERMAL
  Filled 2019-09-28 (×10): qty 1

## 2019-09-28 MED ORDER — BACLOFEN 10 MG PO TABS
10.0000 mg | ORAL_TABLET | Freq: Three times a day (TID) | ORAL | Status: DC
  Administered 2019-09-28 – 2019-10-07 (×24): 10 mg via ORAL
  Filled 2019-09-28 (×30): qty 1

## 2019-09-28 MED ORDER — IPRATROPIUM-ALBUTEROL 0.5-2.5 (3) MG/3ML IN SOLN: 3.0000 mL | Freq: Four times a day (QID) | RESPIRATORY_TRACT | Status: DC

## 2019-09-28 MED ORDER — METHYLPREDNISOLONE SODIUM SUCC 125 MG IJ SOLR
40.0000 mg | Freq: Four times a day (QID) | INTRAMUSCULAR | Status: DC
  Administered 2019-09-28: 40 mg via INTRAVENOUS
  Filled 2019-09-28: qty 2

## 2019-09-28 MED ORDER — HYDROCODONE-ACETAMINOPHEN 5-325 MG PO TABS
1.0000 | ORAL_TABLET | Freq: Four times a day (QID) | ORAL | Status: DC | PRN
  Administered 2019-09-28 – 2019-10-07 (×21): 1 via ORAL
  Filled 2019-09-28 (×22): qty 1

## 2019-09-28 MED ORDER — METHYLPHENIDATE HCL ER (OSM) 27 MG PO TBCR: 54.0000 mg | EXTENDED_RELEASE_TABLET | ORAL | Status: DC

## 2019-09-28 MED ORDER — QUETIAPINE FUMARATE 50 MG PO TABS
150.0000 mg | ORAL_TABLET | Freq: Every day | ORAL | Status: DC
  Administered 2019-09-28 – 2019-10-07 (×8): 150 mg via ORAL
  Filled 2019-09-28 (×2): qty 3
  Filled 2019-09-28: qty 2
  Filled 2019-09-28 (×5): qty 3

## 2019-09-28 MED ORDER — LAMOTRIGINE 100 MG PO TABS
200.0000 mg | ORAL_TABLET | Freq: Every day | ORAL | Status: DC
  Administered 2019-09-28 – 2019-10-07 (×8): 200 mg via ORAL
  Filled 2019-09-28 (×8): qty 2

## 2019-09-28 MED ORDER — TROLAMINE SALICYLATE 10 % EX CREA: TOPICAL_CREAM | CUTANEOUS | Status: DC | PRN

## 2019-09-28 MED ORDER — POLYETHYLENE GLYCOL 3350 17 G PO PACK: 17.0000 g | PACK | Freq: Every day | ORAL | Status: DC | PRN

## 2019-09-28 MED ORDER — PREDNISONE 20 MG PO TABS: 20.0000 mg | ORAL_TABLET | Freq: Every day | ORAL | Status: DC

## 2019-09-28 MED ORDER — MORPHINE SULFATE (PF) 2 MG/ML IV SOLN
1.0000 mg | Freq: Four times a day (QID) | INTRAVENOUS | Status: DC | PRN
  Administered 2019-09-28: 1 mg via INTRAVENOUS
  Filled 2019-09-28: qty 1

## 2019-09-28 MED ORDER — LORAZEPAM 2 MG/ML IJ SOLN
1.0000 mg | INTRAMUSCULAR | Status: DC | PRN
  Administered 2019-09-29 – 2019-10-03 (×6): 1 mg via INTRAVENOUS
  Filled 2019-09-28 (×6): qty 1

## 2019-09-28 MED ORDER — GUAIFENESIN ER 600 MG PO TB12
600.0000 mg | ORAL_TABLET | Freq: Two times a day (BID) | ORAL | Status: DC
  Administered 2019-09-28 – 2019-10-07 (×16): 600 mg via ORAL
  Filled 2019-09-28 (×20): qty 1

## 2019-09-28 MED ORDER — VENLAFAXINE HCL ER 150 MG PO CP24
150.0000 mg | ORAL_CAPSULE | Freq: Every day | ORAL | Status: DC
  Administered 2019-09-28 – 2019-10-07 (×8): 150 mg via ORAL
  Filled 2019-09-28 (×11): qty 1

## 2019-09-28 MED ORDER — PREDNISONE 10 MG PO TABS
40.0000 mg | ORAL_TABLET | Freq: Every day | ORAL | Status: DC
  Administered 2019-09-29: 40 mg via ORAL
  Filled 2019-09-28: qty 4

## 2019-09-28 MED ORDER — ARFORMOTEROL TARTRATE 15 MCG/2ML IN NEBU
15.0000 ug | INHALATION_SOLUTION | Freq: Two times a day (BID) | RESPIRATORY_TRACT | Status: DC
  Administered 2019-09-28 – 2019-10-07 (×19): 15 ug via RESPIRATORY_TRACT
  Filled 2019-09-28 (×23): qty 2

## 2019-09-28 MED ORDER — METHYLPHENIDATE HCL ER (OSM) 18 MG PO TBCR
72.0000 mg | EXTENDED_RELEASE_TABLET | ORAL | Status: DC
  Administered 2019-09-29 – 2019-10-07 (×8): 72 mg via ORAL
  Filled 2019-09-28 (×8): qty 4

## 2019-09-28 MED ORDER — LORATADINE 10 MG PO TABS
10.0000 mg | ORAL_TABLET | Freq: Every evening | ORAL | Status: DC
  Administered 2019-09-28 – 2019-10-07 (×9): 10 mg via ORAL
  Filled 2019-09-28 (×9): qty 1

## 2019-09-28 NOTE — Progress Notes (Signed)
[place] PHYSICAL MEDICINE & REHABILITATION PROGRESS NOTE   Subjective/Complaints: Mid back pain  No sig change in breathing /wheezing with increase prednisone   ROS: (+) SOB- since admission;   Pt denies abd pain, CP, N/V/C/D, and vision changes   Objective:   No results found. No results for input(s): WBC, HGB, HCT, PLT in the last 72 hours. Recent Labs    09/26/19 0612  CREATININE 0.68    Intake/Output Summary (Last 24 hours) at 09/28/2019 0816 Last data filed at 09/27/2019 2000 Gross per 24 hour  Intake 782 ml  Output --  Net 782 ml     Physical Exam: Vital Signs Blood pressure (!) 141/83, pulse 96, temperature 98.3 F (36.8 C), resp. rate 20, height 5\' 3"  (1.6 m), weight 90.3 kg, SpO2 93 %.   General: No acute distress Mood and affect are appropriate Heart: Regular rate and rhythm no rubs murmurs or extra sounds Lungs: Clear to auscultation, breathing mildly labored after starting to talk , + insp wheezes, + accessory muscle use  Abdomen: Positive bowel sounds, soft nontender to palpation, nondistended Extremities: No clubbing, cyanosis, or edema Skin: No evidence of breakdown, no evidence of rash    Musculoskeletal: Full range of motion in all 4 extremities. No joint swelling Musc: No edema in extremities.  No tenderness in extremities. TTP in left thoracic paraspinal. Lidocaine patch is in place here. Difficulty log rolling, needs assistance.  Neuro: Alert Motor: 4/5 throughout BUE and BLE unchanged   Assessment/Plan: 1. Functional deficits secondary to Debility due to COPD exacerbation, prolonged hospitalization  which require 3+ hours per day of interdisciplinary therapy in a comprehensive inpatient rehab setting.  Physiatrist is providing close team supervision and 24 hour management of active medical problems listed below.  Physiatrist and rehab team continue to assess barriers to discharge/monitor patient progress toward functional and medical  goals  Care Tool:  Bathing    Body parts bathed by patient: Chest, Abdomen, Face, Right arm, Left arm, Right upper leg, Left upper leg, Front perineal area   Body parts bathed by helper: Buttocks, Right lower leg, Left lower leg     Bathing assist Assist Level: Minimal Assistance - Patient > 75%     Upper Body Dressing/Undressing Upper body dressing   What is the patient wearing?: Pull over shirt Orthosis activity level: Performed by helper  Upper body assist Assist Level: Supervision/Verbal cueing    Lower Body Dressing/Undressing Lower body dressing      What is the patient wearing?: Pants     Lower body assist Assist for lower body dressing: Minimal Assistance - Patient > 75%     Toileting Toileting    Toileting assist Assist for toileting: Supervision/Verbal cueing Assistive Device Comment:  (BSC)   Transfers Chair/bed transfer  Transfers assist     Chair/bed transfer assist level: Supervision/Verbal cueing Chair/bed transfer assistive device: Programmer, multimedia   Ambulation assist      Assist level: Supervision/Verbal cueing Assistive device: Walker-rolling Max distance: 29ft   Walk 10 feet activity   Assist     Assist level: Supervision/Verbal cueing Assistive device: Walker-rolling   Walk 50 feet activity   Assist Walk 50 feet with 2 turns activity did not occur: Safety/medical concerns  Assist level: Supervision/Verbal cueing Assistive device: Walker-rolling    Walk 150 feet activity   Assist Walk 150 feet activity did not occur: Safety/medical concerns  Assist level: Supervision/Verbal cueing Assistive device: Walker-rolling    Walk 10 feet  on uneven surface  activity   Assist Walk 10 feet on uneven surfaces activity did not occur: Safety/medical concerns         Wheelchair     Assist Will patient use wheelchair at discharge?: No             Wheelchair 50 feet with 2 turns  activity    Assist            Wheelchair 150 feet activity     Assist          Blood pressure (!) 141/83, pulse 96, temperature 98.3 F (36.8 C), resp. rate 20, height 5\' 3"  (1.6 m), weight 90.3 kg, SpO2 93 %.  Medical Problem List and Plan: 1.  Decreased functional ability secondary to recurrent COPD exacerbation with compression fracture T6.  Back brace when out of bed..  Continue nebulizer treatments as directed.   Continue CIR PT, OT  2.  Antithrombotics: -DVT/anticoagulation: Lovenox- d/c since ambulated 200 feet.              -antiplatelet therapy: N/A 3. Pain Management: Baclofen 10 mg 3 times daily, Lidoderm patch, hydrocodone as needed. Log roll to minimize back pain  Increased Norco to 5-10 mg q4 hours prn due to compression   lidoderm patches to daytime 8am to 8pm to start in AM  kpad helping pain, esp at night- will con't regimen  9/14: waking at night for pain meds- switched from Norco to extended release Oxycontin.   9/15: this has really helped. Thoracic spine is stable.   9/16: well controlled  9/17: pain has been better controlled but since oxycontin may be causing respiratory depression, will switch back to Norco. Will replace ibuprofen with prednisone as well.  9/19 add sports cream prn to mid back  4. Mood: Lamictal 200 mg nightly, Effexor 536 mg daily Concerta 72 mg daily Xanax as needed. Has some anxiety.              -antipsychotic agents: Seroquel 150 mg nightly, Risperdal 3 mg nightly  Anxious regarding her breathing. Has been trying meditating and the Xanax has been helping.   9/17: Appreciate medication incorporated into her therapy sessions.  5. Neuropsych: This patient is capable of making decisions on her own behalf. 6. Skin/Wound Care: Routine skin checks 7. Fluids/Electrolytes/Nutrition: Routine in and outs. 8. Acute on chronic respiratory failure with hypoxia: continue to monitor saturations closely with therapy.  Wean oxygen as  tolerated. - Pt had prn O2 at home, but didn't wear full time.  Continue to wean supplemental oxygen as tolerated, may need to go home on supplemental O2  9/13- hasn't been able to wean at ALL from 2L O2- was on at home prior- spoke with OT about checking if she needs it with exercise.   9/14: desatting to 80s with 1L O2, doing well with 2L O2  9/15: reassured that we will wean her off longterm. Advised meditation  9/16: Required 3L this morning due to increased SOB. CXR reviewed and without acute changes. Change nebulizer to q4H prn.   9/17: changed o2 order with saturation goals greater than 88  Will need to increase prednisone (had been weaned quickly off from 40mg ) currently on 5mg  will increased to 30mg  per day but no sig improvement will wean to 20mg   9. Leukocytosis  WBCs 12.1 on 9/6, normal on 9/14  Afebrile 10.  Drug-induced constipation  Overall improving  LOS: 16 days A FACE TO FACE EVALUATION WAS PERFORMED  Julia Daniels 09/28/2019, 8:16 AM

## 2019-09-28 NOTE — Progress Notes (Addendum)
Call to room for SOB.  Pt with RR>30, cyanotic coloring, in respiratory distress with accessory muscle use , leaning forward.   Pt states she has back pain  (at T6 comp site)  Exam  Coir Tachycardic Lungs not moving air, short shallow breathing  No leg swelling  O2 sat 75% on [place], switched to non rebreather  Respiratoy called now in room  Stat portable CXR and EKG ordered , ABG ordered  A/P acute exacerbation of COPD  [facility] contacted,trying to contact pulmaonry

## 2019-09-28 NOTE — H&P (Addendum)
[facility] [place] NAME: Julia Daniels    MR#:  [id number]  DATE OF BIRTH:  Apr 06, 1950  DATE OF ADMISSION:  09/28/2019  PRIMARY CARE PHYSICIAN: Simona Huh, NP   REQUESTING/REFERRING PHYSICIAN: Dr Letta Pate  Patient coming from : Inpatient Rehab   CHIEF COMPLAINT:   Increasing shortness of breath, chest tightness, difficulty breathing.  History obtained from rehab MD Dr. Unice Cobble record, RN HISTORY OF PRESENT ILLNESS:  Julia Daniels  is a 69 y.o. female with a known history of chronic COPD, anxiety disorder, depression came to the emergency room on 830 2021 with shortness of breath. She was admitted with COPD exacerbation, recurrent with severe DJD.  Patient was then transferred to inpatient rehab for intensive physical therapy. This morning patient started having increasing shortness of breath around 930.  Patient was placed on BiPAP when I went to see her for respiratory distress.  ICU attending Dr. Erskine Emery evaluated the patient. Patient is being admitted for acute on chronic hypoxic respiratory failure secondary to COPD exacerbation on progressive care unit.  Patient is unable to give much history review of systems being on BiPAP. She points to having chest tightness.    PAST MEDICAL HISTORY:   Past Medical History:  Diagnosis Date  . Anxiety   . Asthma   . COPD (chronic obstructive pulmonary disease) ([place])   . Depression     PAST SURGICAL HISTOIRY:   Past Surgical History:  Procedure Laterality Date  . ABDOMINAL HYSTERECTOMY     partial     SOCIAL HISTORY:   Social History   Tobacco Use  . Smoking status: Former Smoker    Packs/day: 1.00    Years: 40.00    Pack years: 40.00    Types: Cigarettes    Quit date: 08/23/2017    Years since quitting: 2.0  . Smokeless tobacco: Never Used  Substance Use Topics  . Alcohol use: No    FAMILY HISTORY:   Family History  Problem Relation Age of  Onset  . Asthma Mother   . Lung cancer Father   . COPD Father     DRUG ALLERGIES:   Allergies  Allergen Reactions  . Sulfa Antibiotics Other (See Comments)    Mouth gets raw    REVIEW OF SYSTEMS:  Review of Systems  Unable to perform ROS: Mental acuity   on BiPAP  MEDICATIONS AT HOME:   Prior to Admission medications   Medication Sig Start Date End Date Taking? Authorizing Provider  albuterol (PROAIR HFA) 108 (90 Base) MCG/ACT inhaler Inhale 2 puffs into the lungs every 6 (six) hours as needed for wheezing or shortness of breath. 07/25/19   Tanda Rockers, MD  albuterol (PROVENTIL) (2.5 MG/3ML) 0.083% nebulizer solution Take 3 mLs (2.5 mg total) by nebulization every 6 (six) hours as needed for wheezing or shortness of breath. 09/10/19   Tanda Rockers, MD  ALPRAZolam Duanne Moron) 0.25 MG tablet Take 0.25 mg by mouth daily as needed for anxiety.  07/21/17   [provider]  atorvastatin (LIPITOR) 40 MG tablet Take 1 tablet (40 mg total) by mouth daily. 09/12/19 10/12/19  Florencia Reasons, MD  baclofen (LIORESAL) 10 MG tablet Take 10 mg by mouth 3 (three) times daily as needed. 09/05/19   [provider]  bismuth subsalicylate (PEPTO BISMOL) 262 MG chewable tablet Chew 262-524 mg by mouth daily as needed for diarrhea or loose stools.    [provider]  budesonide-formoterol [facility]  160-4.5 MCG/ACT inhaler INHALE 2 PUFFS INTO LUNGS TWICE A DAY Patient taking differently: Inhale 2 puffs into the lungs in the morning and at bedtime.  08/26/19   Tanda Rockers, MD  EPINEPHrine 0.3 mg/0.3 mL IJ SOAJ injection Inject 0.3 mLs (0.3 mg total) into the muscle as needed for anaphylaxis. 07/15/19   Martyn Ehrich, NP  famotidine (PEPCID) 20 MG tablet One at bedtime Patient taking differently: Take 20 mg by mouth at bedtime.  07/25/19   Tanda Rockers, MD  FASENRA 30 MG/ML SOSY INJECT 1 SYRINGE UNDER THE SKIN EVERY 8 WEEKS. Patient taking differently: Inject 30 mg into the skin  every 8 (eight) weeks.  01/20/19   Tanda Rockers, MD  guaiFENesin (MUCINEX) 600 MG 12 hr tablet Take 1 tablet (600 mg total) by mouth 2 (two) times daily. 09/12/19   Florencia Reasons, MD  HYDROcodone-acetaminophen (NORCO/VICODIN) 5-325 MG tablet Take 1 tablet by mouth in the morning, at noon, in the evening, and at bedtime.  09/05/19   [provider]  ibuprofen (ADVIL) 800 MG tablet Take 800 mg by mouth 2 (two) times daily as needed for pain. 08/25/19   [provider]  lamoTRIgine (LAMICTAL) 200 MG tablet Take 200 mg by mouth at bedtime.     [provider]  lidocaine (LIDODERM) 5 % Place 1 patch onto the skin daily. Remove & Discard patch within 12 hours or as directed by MD 09/12/19   Florencia Reasons, MD  loratadine (CLARITIN) 10 MG tablet Take 10 mg by mouth every evening.     [provider]  methylphenidate 18 MG PO CR tablet Take 18 mg by mouth daily. Take with 54 mg tablet= 72mg  07/19/19   [provider]  methylphenidate 54 MG PO CR tablet Take 54 mg by mouth every morning. Take with 18 mg tablet= 72mg     [provider]  montelukast (SINGULAIR) 10 MG tablet Take 1 tablet (10 mg total) by mouth at bedtime. 04/17/19   Tanda Rockers, MD  pantoprazole (PROTONIX) 40 MG tablet Take 1 tablet (40 mg total) by mouth daily. Take 30-60 min before first meal of the day 07/25/19   Tanda Rockers, MD  predniSONE (DELTASONE) 10 MG tablet Take four tablets on day 1, three tablet on day 2, two tablet on day 3, one tablet on day 4, then stop. 09/12/19   Florencia Reasons, MD  QUEtiapine (SEROQUEL) 100 MG tablet Take 150 mg by mouth at bedtime. 08/18/19   [provider]  risperiDONE (RISPERDAL) 1 MG tablet Take 3 mg by mouth at bedtime.  07/06/17   [provider]  senna-docusate (SENOKOT-S) 8.6-50 MG tablet Take 1 tablet by mouth 2 (two) times daily. 09/12/19   Florencia Reasons, MD  Spacer/Aero-Holding Chambers (AEROCHAMBER MV) inhaler Use as instructed 03/14/18   Lauraine Rinne, NP   Tiotropium Bromide Monohydrate (SPIRIVA RESPIMAT) 2.5 MCG/ACT AERS Inhale 2 puffs into the lungs daily. Patient not taking: Reported on 09/08/2019 08/29/19   Tanda Rockers, MD  Tiotropium Bromide Monohydrate (SPIRIVA RESPIMAT) 2.5 MCG/ACT AERS Inhale 2 puffs into the lungs daily. 09/02/19   Tanda Rockers, MD  venlafaxine XR (EFFEXOR-XR) 150 MG 24 hr capsule Take 150 mg by mouth at bedtime.     [provider]  Vitamin D, Ergocalciferol, (DRISDOL) 50000 units CAPS capsule Take 50,000 Units by mouth every Saturday.  06/18/17   [provider]      VITAL SIGNS:  There were  no vitals taken for this visit.  PHYSICAL EXAMINATION:  GENERAL:  69 y.o.-year-old patient lying in the bed with mild to moderate acute respiratory distress.  HEENT: Head atraumatic, normocephalic. Oropharynx and nasopharynx clear. BiPAP LUNGS: decreased breath sounds bilaterally, no wheezing, rales, or crepitation. No use of accessory muscles of respiration. Scattered rhonchi CARDIOVASCULAR: S1, S2 normal. No murmurs, rubs, or gallops.  ABDOMEN: Soft, nontender, nondistended. Bowel sounds present. No organomegaly or mass. Abdominal obesity EXTREMITIES: No pedal edema, cyanosis, or clubbing.  NEUROLOGIC: moves all extremities well. Unable to assess at present.  PSYCHIATRIC: The patient is alert and oriented x 3.  SKIN: No obvious rash, lesion, or ulcer.   per RN LABORATORY PANEL:   CBC Recent Labs  Lab 09/23/19 0827  WBC 8.4  HGB 11.6*  HCT 37.9  PLT 271   ------------------------------------------------------------------------------------------------------------------  Chemistries  Recent Labs  Lab 09/23/19 0827 09/23/19 0827 09/26/19 0612  NA 136  --   --   K 4.1  --   --   CL 99  --   --   CO2 26  --   --   GLUCOSE 128*  --   --   BUN 8  --   --   CREATININE 0.77   < > 0.68  CALCIUM 9.8  --   --    < > = values in this interval not displayed.    ------------------------------------------------------------------------------------------------------------------  Cardiac Enzymes No results for input(s): TROPONINI in the last 168 hours. ------------------------------------------------------------------------------------------------------------------  RADIOLOGY:  DG CHEST PORT 1 VIEW  Result Date: 09/28/2019 CLINICAL DATA:  Shortness of breath EXAM: PORTABLE CHEST 1 VIEW COMPARISON:  September 25, 2019 FINDINGS: The cardiomediastinal silhouette is unchanged and enlarged in contour. No pleural effusion. No pneumothorax. There are scattered bibasilar linear opacities, mildly increased in comparison to prior. Unchanged linear opacity of the LEFT upper lobe. Emphysema. Visualized abdomen is unremarkable. Multilevel degenerative changes of the thoracic spine. Compression fracture of the midthoracic spine, unchanged. IMPRESSION: Scattered bibasilar linear opacities, mildly increased in comparison to prior. These are favored to reflect increasing atelectasis. Electronically Signed   By: Valentino Saxon MD   On: 09/28/2019 10:42    EKG:    IMPRESSION AND PLAN:   Bert Givans  is a 70 y.o. female with a known history of chronic COPD, anxiety disorder, depression came to the emergency room on 830 2021 with shortness of breath. She was admitted with COPD exacerbation, recurrent with severe DJD. Patient was then transferred to inpatient rehab for intensive physical therapy. This morning patient started having increasing shortness of breath around 930.  Patient was placed on BiPAP when I went to see her for respiratory distress.  Acute on chronic hypoxic respiratory failure with decreased air entry along with significant anxiety. -Transfer to PCU -BiPAP-- wean to nasal cannula as tolerated -chest x-ray -IV steroids, bronchodilators, inhalers -PRN morphine for anxiety/chest tightness  Severe anxiety disorder/bipolar -continue Xanax,  Seroquel, Risperdal, Effexor, Concerta -psych consult if needed  Generalized weakness and ability due to COPD with prolonged hospitalization -patient was getting inpatient rehab -PT OT to be ordered once patient off BiPAP  Hyperlipidemia control continue atorvastatin   Family Communication : none today Consults : ICU attending Dr. Erskine Emery Code Status : full code (as listed) DVT prophylaxis : Lovenox  TOTAL TIME TAKING CARE OF THIS PATIENT: *50* minutes.    Fritzi Mandes M.D  [facility]     CC: Primary care physician; Simona Huh, NP

## 2019-09-28 NOTE — Progress Notes (Signed)
Patient having respiratory distress, SOB, 02 sat in the 80s, MD notified, rapid respond nurse called, RT also called, NRB mask applied. Patient placed on Bipap and move to ICU. We continue to monitor.

## 2019-09-28 NOTE — Progress Notes (Signed)
Re evaluated, seems better with BIPAP and PRN anxiolytics. Probably more panic attack than AECOPD, PO steroids as ordered. Fine for progressive if bed available. Pulmonary team will see in AM.  Erskine Emery MD PCCM

## 2019-09-28 NOTE — Progress Notes (Signed)
Called by bedside RN to come assess pt. Pt was in respiratory distress, breathing in the high 40s, absent BS, and on a NRB. Pt was placed on BIPAP, given 5 mg Albuterol PRN, and then transferred to ICU. Pt is now stable on BIPAP. RT will monitor.

## 2019-09-28 NOTE — Significant Event (Signed)
Rapid Response Event Note   Reason for Call :  Respiratory distress  Initial Focused Assessment:  Received a call from the RN stating that his patient was having trouble breathing.  The patient was having a COPD flair up.  When I got to the room RRT was there and the patient was on a NRB.  She had audible wheezes. Patient was extremely anxious and appeared to be in respiratory distress.   BP 145/83 HR 127 Resp 26 O2 98 on bipap Temp 100.5   Interventions:  IV placed, steroids given, BiPap, and patient was transported without issue to 32M[facility]08  Plan of Care:     Event Summary:   MD Notified: Rehab and CCM Call Time: 0935 Arrival Time: 0940 End Time: [place], RN

## 2019-09-28 NOTE — Discharge Summary (Signed)
Physician Discharge Summary  Patient ID: Julia Daniels MRN: [id number] DOB/AGE: August 02, 1950 69 y.o.  Admit date: 09/12/2019 Discharge date: 09/28/2019  Discharge Diagnoses:  Principal Problem:   Debility Active Problems:   COPD exacerbation ([place])   Drug induced constipation   Leukocytosis   Supplemental oxygen dependent   Anxiety state DVT prophylaxis  Discharged Condition: Guarded  Significant Diagnostic Studies: DG Thoracic Spine W/Swimmers  Result Date: 09/23/2019 CLINICAL DATA:  Pain.  Additional provided: Upper to mid back pain. EXAM: THORACIC SPINE - 3 VIEWS COMPARISON:  CT angiogram chest 09/08/2019. FINDINGS: Redemonstrated T5 compression fracture with severe vertebral body height loss. There is no definite significant bony retropulsion at this level. No appreciable significant spondylolisthesis. No interval thoracic vertebral compression fracture is identified as compared to the prior examination of 09/08/2019. Multilevel disc space narrowing, degenerative endplate sclerosis and prominent ventrolateral osteophytes. IMPRESSION: Redemonstrated severe T5 compression fracture. No appreciable interval thoracic vertebral compression fracture as compared to the CT chest of 09/08/2019. Thoracic spondylosis as described. Electronically Signed   By: Kellie Simmering DO   On: 09/23/2019 13:05   CT ANGIO CHEST PE W OR WO CONTRAST  Result Date: 09/08/2019 CLINICAL DATA:  COPD exacerbation. EXAM: CT ANGIOGRAPHY CHEST WITH CONTRAST TECHNIQUE: Multidetector CT imaging of the chest was performed using the standard protocol during bolus administration of intravenous contrast. Multiplanar CT image reconstructions and MIPs were obtained to evaluate the vascular anatomy. CONTRAST:  117mL OMNIPAQUE IOHEXOL 350 MG/ML SOLN COMPARISON:  August 23, 2017 FINDINGS: Cardiovascular: Satisfactory opacification of the pulmonary arteries to the segmental level. No evidence of pulmonary embolism. Normal heart size.  No pericardial effusion. Marked severity coronary artery calcification is noted Mediastinum/Nodes: No enlarged mediastinal, hilar, or axillary lymph nodes. Thyroid gland, trachea, and esophagus demonstrate no significant findings. Lungs/Pleura: Mild atelectasis is seen within the bilateral lung bases and posterior aspect of the left upper lobe. There is no evidence of a pleural effusion or pneumothorax. Upper Abdomen: A 2.6 cm cyst is seen within the right kidney. Musculoskeletal: A compression fracture deformity of indeterminate age is seen involving the T6 vertebral body. This represents a new finding compared to the prior exam. Multilevel degenerative changes are noted throughout the remainder of the thoracic spine. Review of the MIP images confirms the above findings. IMPRESSION: 1. No evidence of pulmonary embolism. 2. Mild bibasilar and left upper lobe atelectasis. 3. Marked severity coronary artery calcification. 4. Compression fracture deformity of indeterminate age involving the T6 vertebral body. This represents a new finding compared to the prior exam. Electronically Signed   By: Virgina Norfolk M.D.   On: 09/08/2019 20:24   CT ABDOMEN PELVIS W CONTRAST  Result Date: 09/08/2019 CLINICAL DATA:  COPD exacerbation, right flank pain, low back pain EXAM: CT ABDOMEN AND PELVIS WITH CONTRAST TECHNIQUE: Multidetector CT imaging of the abdomen and pelvis was performed using the standard protocol following bolus administration of intravenous contrast. CONTRAST:  164mL OMNIPAQUE IOHEXOL 350 MG/ML SOLN COMPARISON:  None. FINDINGS: Lower chest: Scattered atelectasis or scarring at the lung bases. No acute pleural or parenchymal lung disease. Hepatobiliary: No focal liver abnormality is seen. No gallstones, gallbladder wall thickening, or biliary dilatation. Pancreas: Unremarkable. No pancreatic ductal dilatation or surrounding inflammatory changes. Spleen: Normal in size without focal abnormality.  Adrenals/Urinary Tract: Small right renal cyst. Otherwise the kidneys enhance normally. No urinary tract calculi or obstructive uropathy. The adrenals and bladder are unremarkable. Stomach/Bowel: No bowel obstruction or ileus. No wall thickening or inflammatory change. The appendix,  if still present, is not well visualized. There are no inflammatory changes to suggest appendicitis. Vascular/Lymphatic: Aortic atherosclerosis. No enlarged abdominal or pelvic lymph nodes. Reproductive: Status post hysterectomy. No adnexal masses. Other: No free fluid or free gas.  No abdominal wall hernia. Musculoskeletal: There are no acute displaced fractures. There is diffuse spondylosis throughout the thoracolumbar spine. From L3/L4 through L5/S1 there is circumferential disc bulge and bilateral facet hypertrophy resulting in mild central canal stenosis and symmetrical neural foraminal encroachment. These changes are most pronounced at the L4/L5 and L5/S1 levels. Reconstructed images demonstrate no additional findings. IMPRESSION: 1. No acute intra-abdominal or intrapelvic process. 2. Prominent lower lumbar spondylosis greatest at L4/L5 and L5/S1. 3. Aortic Atherosclerosis (ICD10-I70.0). Electronically Signed   By: Randa Ngo M.D.   On: 09/08/2019 20:17   CT L-SPINE NO CHARGE  Result Date: 09/08/2019 CLINICAL DATA:  COPD exacerbation, low back pain, right flank pain EXAM: CT LUMBAR SPINE WITHOUT CONTRAST TECHNIQUE: Multidetector CT imaging of the lumbar spine was performed without intravenous contrast administration. Multiplanar CT image reconstructions were also generated. COMPARISON:  None. FINDINGS: Segmentation: There are 5 non-rib-bearing lumbar type vertebral bodies. Alignment: Alignment is anatomic. Vertebrae: There are no acute displaced fractures. No destructive bony lesions. Paraspinal and other soft tissues: Paraspinal soft tissues are unremarkable. Atherosclerosis throughout the abdominal aorta. Disc levels:  There is multilevel thoracolumbar spondylosis. Prominent anterior osteophytes are seen from T10 through L2. No significant central canal or neural foraminal encroachment. At L3/L4 there is bilateral left greater than right neural foraminal encroachment due to circumferential disc bulge and bilateral facet hypertrophy. And L4/L5 there is circumferential disc bulge with bilateral facet hypertrophy resulting in mild central stenosis and symmetrical neural foraminal encroachment. At L5/S1 there is mild circumferential disc bulge and bilateral facet hypertrophy with mild symmetrical neural foraminal encroachment. Reconstructed images demonstrate no additional findings. IMPRESSION: 1. Multilevel thoracolumbar spondylosis, greatest from L3/L4 through L5/S1 as above. 2. No acute bony abnormality. Electronically Signed   By: Randa Ngo M.D.   On: 09/08/2019 20:49   DG CHEST PORT 1 VIEW  Result Date: 09/28/2019 CLINICAL DATA:  Shortness of breath EXAM: PORTABLE CHEST 1 VIEW COMPARISON:  September 25, 2019 FINDINGS: The cardiomediastinal silhouette is unchanged and enlarged in contour. No pleural effusion. No pneumothorax. There are scattered bibasilar linear opacities, mildly increased in comparison to prior. Unchanged linear opacity of the LEFT upper lobe. Emphysema. Visualized abdomen is unremarkable. Multilevel degenerative changes of the thoracic spine. Compression fracture of the midthoracic spine, unchanged. IMPRESSION: Scattered bibasilar linear opacities, mildly increased in comparison to prior. These are favored to reflect increasing atelectasis. Electronically Signed   By: Valentino Saxon MD   On: 09/28/2019 10:42   DG CHEST PORT 1 VIEW  Result Date: 09/25/2019 CLINICAL DATA:  Shortness of breath. EXAM: PORTABLE CHEST 1 VIEW COMPARISON:  09/08/2019 FINDINGS: The heart is mildly enlarged but stable. There is tortuosity and mild calcification of the thoracic aorta. No infiltrates or effusions. Streaky  basilar scarring changes. IMPRESSION: No acute cardiopulmonary findings. Electronically Signed   By: Marijo Sanes M.D.   On: 09/25/2019 06:38   DG Chest Port 1 View  Result Date: 09/08/2019 CLINICAL DATA:  Dyspnea and shortness of breath x2 days. EXAM: PORTABLE CHEST 1 VIEW COMPARISON:  August 27, 2019 FINDINGS: Mild atelectasis and/or infiltrate is seen within the left lung base. There is no evidence of a pleural effusion or pneumothorax. The heart size and mediastinal contours are within normal limits. Tortuosity of the descending  thoracic aorta is seen. The visualized skeletal structures are unremarkable. IMPRESSION: Mild left basilar atelectasis and/or infiltrate. Electronically Signed   By: Virgina Norfolk M.D.   On: 09/08/2019 16:30   ECHOCARDIOGRAM COMPLETE  Result Date: 09/09/2019    ECHOCARDIOGRAM REPORT   Patient Name:   GIABELLA DUHART Date of Exam: 09/09/2019 Medical Rec #:  [id number]           Height:       63.0 in Accession #:    [id number]          Weight:       196.2 lb Date of Birth:  February 12, 1950          BSA:          1.918 m Patient Age:    13 years            BP:           146/109 mmHg Patient Gender: F                   HR:           110 bpm. Exam Location:  Inpatient Procedure: 2D Echo, Cardiac Doppler and Color Doppler Indications:    ACUTE rESPIRATORY INSUFFICIENCY 518.82 / r06.89  History:        Patient has no prior history of Echocardiogram examinations.                 COPD.  Sonographer:    Jonelle Sidle Dance Referring Phys: [id number] [place]  Sonographer Comments: No subcostal window. IMPRESSIONS  1. Left ventricular ejection fraction, by estimation, is 55%. The left ventricle has normal function. The left ventricle demonstrates regional wall motion abnormalities with basal inferior akinesis. Left ventricular diastolic parameters are consistent with Grade I diastolic dysfunction (impaired relaxation).  2. Right ventricular systolic function is normal. The right  ventricular size is normal. Tricuspid regurgitation signal is inadequate for assessing PA pressure.  3. The mitral valve is normal in structure. No evidence of mitral valve regurgitation. No evidence of mitral stenosis.  4. The aortic valve is tricuspid. Aortic valve regurgitation is not visualized. No aortic stenosis is present.  5. Aortic dilatation noted. There is mild dilatation of the ascending aorta measuring 39 mm.  6. The inferior vena cava is normal in size with greater than 50% respiratory variability, suggesting right atrial pressure of 3 mmHg. FINDINGS  Left Ventricle: Left ventricular ejection fraction, by estimation, is 55%. The left ventricle has normal function. The left ventricle demonstrates regional wall motion abnormalities. The left ventricular internal cavity size was normal in size. There is  no left ventricular hypertrophy. Left ventricular diastolic parameters are consistent with Grade I diastolic dysfunction (impaired relaxation). Right Ventricle: The right ventricular size is normal. No increase in right ventricular wall thickness. Right ventricular systolic function is normal. Tricuspid regurgitation signal is inadequate for assessing PA pressure. Left Atrium: Left atrial size was normal in size. Right Atrium: Right atrial size was normal in size. Pericardium: There is no evidence of pericardial effusion. Mitral Valve: The mitral valve is normal in structure. No evidence of mitral valve regurgitation. No evidence of mitral valve stenosis. Tricuspid Valve: The tricuspid valve is normal in structure. Tricuspid valve regurgitation is not demonstrated. Aortic Valve: The aortic valve is tricuspid. Aortic valve regurgitation is not visualized. No aortic stenosis is present. Pulmonic Valve: The pulmonic valve was normal in structure. Pulmonic valve regurgitation is not visualized. Aorta: Aortic dilatation noted. There is  mild dilatation of the ascending aorta measuring 39 mm. Venous: The  inferior vena cava is normal in size with greater than 50% respiratory variability, suggesting right atrial pressure of 3 mmHg. IAS/Shunts: No atrial level shunt detected by color flow Doppler.  LEFT VENTRICLE PLAX 2D LVIDd:         5.50 cm  Diastology LVIDs:         4.40 cm  LV e' lateral:   7.40 cm/s LV PW:         1.10 cm  LV E/e' lateral: 10.9 LV IVS:        1.10 cm  LV e' medial:    7.62 cm/s LVOT diam:     2.20 cm  LV E/e' medial:  10.6 LV SV:         74 LV SV Index:   39 LVOT Area:     3.80 cm  RIGHT VENTRICLE RV Basal diam:  2.80 cm RV S prime:     17.90 cm/s TAPSE (M-mode): 2.0 cm LEFT ATRIUM             Index       RIGHT ATRIUM           Index LA diam:        3.50 cm 1.82 cm/m  RA Area:     14.20 cm LA Vol (A2C):   30.1 ml 15.69 ml/m RA Volume:   36.00 ml  18.77 ml/m LA Vol (A4C):   26.7 ml 13.92 ml/m LA Biplane Vol: 30.2 ml 15.75 ml/m  AORTIC VALVE LVOT Vmax:   127.00 cm/s LVOT Vmean:  89.250 cm/s LVOT VTI:    0.194 m  AORTA Ao Root diam: 3.40 cm Ao Asc diam:  3.90 cm MITRAL VALVE MV Area (PHT): 3.60 cm     SHUNTS MV Decel Time: 211 msec     Systemic VTI:  0.19 m MV E velocity: 80.50 cm/s   Systemic Diam: 2.20 cm MV A velocity: 130.00 cm/s MV E/A ratio:  0.62 Loralie Champagne MD Electronically signed by Loralie Champagne MD Signature Date/Time: 09/09/2019/4:52:45 PM    Final    VAS [place] UPPER EXTREMITY VENOUS DUPLEX  Result Date: 09/14/2019 UPPER VENOUS STUDY  Indications: Edema Comparison Study: No prior studies. Performing Technologist: Darlin Coco  Examination Guidelines: A complete evaluation includes B-mode imaging, spectral Doppler, color Doppler, and power Doppler as needed of all accessible portions of each vessel. Bilateral testing is considered an integral part of a complete examination. Limited examinations for reoccurring indications may be performed as noted.  Right Findings: +----------+------------+---------+-----------+----------+-------+ RIGHT      CompressiblePhasicitySpontaneousPropertiesSummary +----------+------------+---------+-----------+----------+-------+ Subclavian    Full       Yes       Yes                      +----------+------------+---------+-----------+----------+-------+  Left Findings: +----------+------------+---------+-----------+----------+---------------------+ LEFT      CompressiblePhasicitySpontaneousProperties       Summary        +----------+------------+---------+-----------+----------+---------------------+ IJV           Full       Yes       Yes                                    +----------+------------+---------+-----------+----------+---------------------+ Subclavian    Full       Yes       Yes                                    +----------+------------+---------+-----------+----------+---------------------+  Axillary      Full       Yes       Yes                                    +----------+------------+---------+-----------+----------+---------------------+ Brachial      Full                                                        +----------+------------+---------+-----------+----------+---------------------+ Radial        Full                                                        +----------+------------+---------+-----------+----------+---------------------+ Ulnar         Full                                                        +----------+------------+---------+-----------+----------+---------------------+ Cephalic      Full                                                        +----------+------------+---------+-----------+----------+---------------------+ Basilic       Full                                    Branch of basilic                                                        non-compressible at                                                         affected area.      +----------+------------+---------+-----------+----------+---------------------+  Summary:  Right: No evidence of thrombosis in the subclavian.  Left: No evidence of deep vein thrombosis in the upper extremity. Branch of basilic non-compressible at affected area.  *See table(s) above for measurements and observations.  Diagnosing physician: Ruta Hinds MD Electronically signed by Ruta Hinds MD on 09/14/2019 at 10:08:10 AM.    Final     Labs:  Basic Metabolic Panel: Recent Labs  Lab 09/23/19 0827 09/26/19 0612 09/28/19 1158  NA 136  --  139  K 4.1  --  4.1  CL 99  --   --   CO2 26  --   --   GLUCOSE 128*  --   --  BUN 8  --   --   CREATININE 0.77 0.68  --   CALCIUM 9.8  --   --     CBC: Recent Labs  Lab 09/22/19 0708 09/23/19 0827 09/28/19 1158  WBC 7.2 8.4  --   NEUTROABS 4.9 5.7  --   HGB 14.6 11.6* 11.9*  HCT 47.3* 37.9 35.0*  MCV 103.5* 103.3*  --   PLT 166 271  --     CBG: Recent Labs  Lab 09/28/19 1142  GLUCAP 120*   Family history.  Mother with asthma Father with lung cancer and COPD.  Denies any colon cancer esophageal cancer or rectal cancer  Brief HPI:   Julia Daniels is a 69 y.o. right-handed female with history of COPD followed by Dr. Melvyn Novas who quit smoking 2 years ago and multiple admissions for COPD exacerbation, anxiety disorder.  Lives with her family 1 level home 4 steps to entry.  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  Presented 09/08/2019 with increasing shortness of breath as well as wheezing.  CT angiogram of the chest showed no evidence of pulmonary emboli.  Marked severity coronary artery calcification.  Compression fracture deformity of indeterminate age involving the T6 vertebral body.  CT abdomen pelvis showed no acute intra-abdominal or intrapelvic process.  Prominent lower lumbar spondylosis greater at L4-5 and L5-S1..  CT lumbar spine showed no acute bony abnormality.  Admission chemistries unremarkable SARS  coronavirus negative troponin negative urinalysis negative nitrite.  Interventional radiology consulted for possible kyphoplasty T6 was not recommended at this time advised conservative care fitted with TLSO back brace.  Vascular study right lower extremity for some edema negative for DVT.  Lovenox for DVT prophylaxis.  Therapy evaluations completed and patient was admitted for a comprehensive rehab program   Hospital Course: LEVY WELLMAN was admitted to rehab 09/12/2019 for inpatient therapies to consist of PT, ST and OT at least three hours five days a week. Past admission physiatrist, therapy team and rehab RN have worked together to provide customized collaborative inpatient rehab.  Pertaining to patient's COPD exacerbation she had recurrent exacerbations in the past as well as compression fracture T6 with back brace and out of bed nebulizer treatments as directed.  She was ambulating needing rest breaks for endurance.  Ongoing issues in regards to respiratory status desaturations rapid response and continue to follow.  On the morning of 09/28/2019 patient with increased respiratory rate of 30 cyanotic coloring respiratory distress with a sensory muscle use O2 saturation 75% nasal cannula switched to nonrebreather mask stat chest x-ray EKG were ordered ABGs were pending pulmonary services consulted patient was transferred to acute care service for ongoing care.   Blood pressures were monitored on TID basis and did monitored   Rehab course: During patient's stay in rehab weekly team conferences were held to monitor patient's progress, set goals and discuss barriers to discharge. At admission, patient required minimal assist side-lying to sitting minimal assist for rolling supervision sit to stand minimal guard ambulating 30 feet  Physical exam.  Blood pressure 113/62 pulse 80 temperature 98.8 respirations 18 oxygen saturations 92% room air General.  Alert complains of shortness of  breath HEENT Head.  Normocephalic and atraumatic Eyes.  Pupils round and reactive to light no discharge.nystagmus Neck.  Supple nontender no JVD without thyromegaly Cardiac regular rate rhythm without any extra sounds or murmur heard Abdomen.  Soft nontender positive bowel sounds without rebound Chest.  Positive wheezing a few rhonchi  and wheezing heard Extremities no clubbing or edema pulses +2 Neuro.  Alert no acute distress TLSO back brace in place.  Moving all extremities  Julia Daniels  has had improvement in activity tolerance, balance, postural control as well as ability to compensate for deficits. Julia Daniels has had improvement in functional use RUE/LUE  and RLE/LLE as well as improvement in awareness.  Working with energy conservation techniques.  Requires extra time edge of bed to improve breathing patterns.  Ambulating short distances contact-guard assist.  Required close supervision occasional contact-guard for ADLs       Disposition: Discharged to acute care services    Diet: Regular consistency  Special Instructions: As per critical care medicine  30-35 minutes were spent completing discharge summary   Allergies as of 09/28/2019      Reactions   Sulfa Antibiotics Other (See Comments)   Mouth gets raw      Medication List    ASK your doctor about these medications   AeroChamber MV inhaler Use as instructed   albuterol 108 (90 Base) MCG/ACT inhaler Commonly known as: ProAir HFA Inhale 2 puffs into the lungs every 6 (six) hours as needed for wheezing or shortness of breath.   albuterol (2.5 MG/3ML) 0.083% nebulizer solution Commonly known as: PROVENTIL Take 3 mLs (2.5 mg total) by nebulization every 6 (six) hours as needed for wheezing or shortness of breath.   ALPRAZolam 0.25 MG tablet Commonly known as: XANAX Take 0.25 mg by mouth daily as needed for anxiety.   atorvastatin 40 MG tablet Commonly known as: Lipitor Take 1 tablet (40 mg total) by mouth daily.    baclofen 10 MG tablet Commonly known as: LIORESAL Take 10 mg by mouth 3 (three) times daily as needed.   budesonide-formoterol 160-4.5 MCG/ACT inhaler Commonly known as: Symbicort INHALE 2 PUFFS INTO LUNGS TWICE A DAY   doxycycline 100 MG tablet Commonly known as: VIBRA-TABS Take 1 tablet (100 mg total) by mouth every 12 (twelve) hours for 2 days. Ask about: Should I take this medication?   EPINEPHrine 0.3 mg/0.3 mL Soaj injection Commonly known as: EPI-PEN Inject 0.3 mLs (0.3 mg total) into the muscle as needed for anaphylaxis.   famotidine 20 MG tablet Commonly known as: PEPCID One at bedtime   Fasenra 30 MG/ML Sosy Generic drug: Benralizumab INJECT 1 SYRINGE UNDER THE SKIN EVERY 8 WEEKS.   guaiFENesin 600 MG 12 hr tablet Commonly known as: MUCINEX Take 1 tablet (600 mg total) by mouth 2 (two) times daily.   HYDROcodone-acetaminophen 5-325 MG tablet Commonly known as: NORCO/VICODIN Take 1 tablet by mouth in the morning, at noon, in the evening, and at bedtime.   ibuprofen 800 MG tablet Commonly known as: ADVIL Take 800 mg by mouth 2 (two) times daily as needed for pain.   lamoTRIgine 200 MG tablet Commonly known as: LAMICTAL Take 200 mg by mouth at bedtime.   lidocaine 5 % Commonly known as: LIDODERM Place 1 patch onto the skin daily. Remove & Discard patch within 12 hours or as directed by MD   loratadine 10 MG tablet Commonly known as: CLARITIN Take 10 mg by mouth every evening.   methylphenidate 54 MG CR tablet Commonly known as: CONCERTA Take 54 mg by mouth every morning. Take with 18 mg tablet= 72mg    methylphenidate 18 MG CR tablet Commonly known as: CONCERTA Take 18 mg by mouth daily. Take with 54 mg tablet= 72mg    montelukast 10 MG tablet Commonly known as: SINGULAIR Take 1 tablet (  10 mg total) by mouth at bedtime.   pantoprazole 40 MG tablet Commonly known as: Protonix Take 1 tablet (40 mg total) by mouth daily. Take 30-60 min before first  meal of the day   QUEtiapine 100 MG tablet Commonly known as: SEROQUEL Take 150 mg by mouth at bedtime.   risperiDONE 1 MG tablet Commonly known as: RISPERDAL Take 3 mg by mouth at bedtime.   senna-docusate 8.6-50 MG tablet Commonly known as: Senokot-S Take 1 tablet by mouth 2 (two) times daily.   Spiriva Respimat 2.5 MCG/ACT Aers Generic drug: Tiotropium Bromide Monohydrate Inhale 2 puffs into the lungs daily.   Spiriva Respimat 2.5 MCG/ACT Aers Generic drug: Tiotropium Bromide Monohydrate Inhale 2 puffs into the lungs daily.   venlafaxine XR 150 MG 24 hr capsule Commonly known as: EFFEXOR-XR Take 150 mg by mouth at bedtime.   Vitamin D (Ergocalciferol) 1.25 MG (50000 UNIT) Caps capsule Commonly known as: DRISDOL Take 50,000 Units by mouth every Saturday.       Follow-up Information    Lovorn, Jinny Blossom, MD Follow up.   Specialty: Physical Medicine and Rehabilitation Why: No follow-up needed Contact information: [address] [place] [address] [phone]        [place] Rockers, MD Follow up.   Specialty: Pulmonary Disease Why: Call for appointment Contact information: [place] [address] [phone]               Signed: Lavon Paganini [place] 09/28/2019, 1:08 PM

## 2019-09-28 NOTE — Consult Note (Signed)
NAME:  Julia Daniels, MRN:  [id number], DOB:  07-05-1950, LOS: 8 ADMISSION DATE:  09/12/2019, CONSULTATION DATE:  09/28/19 REFERRING MD:  Rehab MD, CHIEF COMPLAINT:  SOB   Brief History   69 year old woman with hx of COPD, anxiety disorder presenting from rehab with severe SOB.  History of present illness   69 year old woman with hx of COPD, anxiety disorder presenting from rehab with severe SOB.  Called to bedside around 9:30 this AM.  Patient able to speak and nod in short sentences but cannot give good history.  She has anxiety, dyspnea at rest, is tripoding, hypoxemic, and using accessory muscles.  She is admitted to rehab on 9/4 due to debility and C6 compression fracture.  She is followed by Dr. 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  Started recently on fasenra.  Already on triple therapy.  Past Medical History  COPD/asthma overlap Anxiety disorder Former smoker Bipolar Class 3 obesity Chronic pain GERD  Significant Hospital Events   8/18-8/24: COPD flare admission 8/30-9/1: COPD flare admission and back pain, sent to rehab 9/1-9/19: IP rehab admission 9/19>> back to [facility] for breathing issues  Consults:  PCCM  Procedures:  N/A  Significant Diagnostic Tests:  CXR pending  Micro Data:  COVD neg 8/30  Antimicrobials:  N/A   Interim history/subjective:  Consulted  Objective   RR in 40s, saturating low 90s NRB  Examination: Constitutional: obese anxious woman in moderate respiratory distress Eyes: eyes are anicteric, eyes tracking, has unequal pupils, unclear if chronic for her, denies vision complaints Ears, nose, mouth, and throat: mucous membranes moist, trachea midline, malampatti 4 Cardiovascular: heart sounds are regular, ext are warm to touch. no edema Respiratory: severely diminished, tachypneic, poor air movement, + accessory muscle use Gastrointestinal: abdomen is soft with + BS Skin: No rashes, normal turgor Neurologic: AOx3, moves all 4  ext Psychiatric: anxious  Resolved Hospital Problem list   n/a  Assessment & Plan:  Acute on chronic hypoxemic respiratory failure- poor air movement on exam suggests either bronchospasm or panic attack causing air trapping.  No PCU beds so given clinical appearance will bring to ICU today. - BIPAP - f/u CXR - steroids/nebs - continue PTA allergy meds - treat anxiety  Severe anxiety disorder, bipolar- does not help asthma/copd control - continue PTA xanax, concerta, seroquel, risperdal, effexor - consider IV PRNs for panic attacks    Best practice:  Diet: NPO pending improvement in breathing status Pain/Anxiety/Delirium protocol (if indicated): generous with anxiolytics VAP protocol (if indicated): N/A DVT prophylaxis: loveonox GI prophylaxis: PPI and H2B Glucose control: n/a Mobility: BR Code Status: full Family Communication:  Updated patient Disposition: ICU pending respiratory stability   Medical Decision Making    Diagnoses that are immediately life threatening include acute respiratory distress Critical test findings: RR in 40s on exam, poor air movement on exam Interventions today to address these diagnoses are: ICU transfer, steroids, nebs, BIPAP Likelihood of life-threatening deterioration without intervention is high.  Labs   CBC: Recent Labs  Lab 09/22/19 0708 09/23/19 0827  WBC 7.2 8.4  NEUTROABS 4.9 5.7  HGB 14.6 11.6*  HCT 47.3* 37.9  MCV 103.5* 103.3*  PLT 166 885    Basic Metabolic Panel: Recent Labs  Lab 09/23/19 0827 09/26/19 0612  NA 136  --   K 4.1  --   CL 99  --   CO2 26  --   GLUCOSE 128*  --   BUN 8  --  CREATININE 0.77 0.68  CALCIUM 9.8  --    GFR: Estimated Creatinine Clearance: 73.8 mL/min (by C-G formula based on SCr of 0.68 mg/dL). Recent Labs  Lab 09/22/19 0708 09/23/19 0827  WBC 7.2 8.4    Liver Function Tests: No results for input(s): AST, ALT, ALKPHOS, BILITOT, PROT, ALBUMIN in the last 168 hours. No  results for input(s): LIPASE, AMYLASE in the last 168 hours. No results for input(s): AMMONIA in the last 168 hours.  ABG    Component Value Date/Time   PHART 7.367 12/31/2017 1642   PCO2ART 43.4 12/31/2017 1642   PO2ART 73.3 (L) 12/31/2017 1642   HCO3 31.6 (H) 09/08/2019 1623   ACIDBASEDEF 0.6 12/31/2017 1642   O2SAT 89.4 09/08/2019 1623     Coagulation Profile: No results for input(s): INR, PROTIME in the last 168 hours.  Cardiac Enzymes: No results for input(s): CKTOTAL, CKMB, CKMBINDEX, TROPONINI in the last 168 hours.  HbA1C: Hgb A1c MFr Bld  Date/Time Value Ref Range Status  08/28/2019 05:06 AM 5.7 (H) 4.8 - 5.6 % Final    Comment:    (NOTE) Pre diabetes:          5.7%-6.4%  Diabetes:              >6.4%  Glycemic control for   <7.0% adults with diabetes   12/31/2017 01:17 PM 5.1 4.8 - 5.6 % Final    Comment:    (NOTE) Pre diabetes:          5.7%-6.4% Diabetes:              >6.4% Glycemic control for   <7.0% adults with diabetes     CBG: No results for input(s): GLUCAP in the last 168 hours.  Review of Systems:   Limited due to degree of respiratory distress Denies chest pain  Past Medical History  She,  has a past medical history of Anxiety, Asthma, COPD (chronic obstructive pulmonary disease) ([place]), and Depression.   Surgical History    Past Surgical History:  Procedure Laterality Date  . ABDOMINAL HYSTERECTOMY     partial      Social History   reports that she quit smoking about 2 years ago. Her smoking use included cigarettes. She has a 40.00 pack-year smoking history. She has never used smokeless tobacco. She reports that she does not drink alcohol and does not use drugs.   Family History   Her family history includes Asthma in her mother; COPD in her father; Lung cancer in her father.   Allergies Allergies  Allergen Reactions  . Sulfa Antibiotics Other (See Comments)    Mouth gets raw     Home Medications  Prior to Admission  medications   Medication Sig Start Date End Date Taking? Authorizing Provider  albuterol (PROAIR HFA) 108 (90 Base) MCG/ACT inhaler Inhale 2 puffs into the lungs every 6 (six) hours as needed for wheezing or shortness of breath. 07/25/19   Tanda Rockers, MD  albuterol (PROVENTIL) (2.5 MG/3ML) 0.083% nebulizer solution Take 3 mLs (2.5 mg total) by nebulization every 6 (six) hours as needed for wheezing or shortness of breath. 09/10/19   Tanda Rockers, MD  ALPRAZolam Duanne Moron) 0.25 MG tablet Take 0.25 mg by mouth daily as needed for anxiety.  07/21/17   [provider]  atorvastatin (LIPITOR) 40 MG tablet Take 1 tablet (40 mg total) by mouth daily. 09/12/19 10/12/19  Florencia Reasons, MD  baclofen (LIORESAL) 10 MG tablet Take 10 mg by mouth  3 (three) times daily as needed. 09/05/19   [provider]  bismuth subsalicylate (PEPTO BISMOL) 262 MG chewable tablet Chew 262-524 mg by mouth daily as needed for diarrhea or loose stools.    [provider]  budesonide-formoterol (SYMBICORT) 160-4.5 MCG/ACT inhaler INHALE 2 PUFFS INTO LUNGS TWICE A DAY Patient taking differently: Inhale 2 puffs into the lungs in the morning and at bedtime.  08/26/19   Tanda Rockers, MD  EPINEPHrine 0.3 mg/0.3 mL IJ SOAJ injection Inject 0.3 mLs (0.3 mg total) into the muscle as needed for anaphylaxis. 07/15/19   Martyn Ehrich, NP  famotidine (PEPCID) 20 MG tablet One at bedtime Patient taking differently: Take 20 mg by mouth at bedtime.  07/25/19   Tanda Rockers, MD  FASENRA 30 MG/ML SOSY INJECT 1 SYRINGE UNDER THE SKIN EVERY 8 WEEKS. Patient taking differently: Inject 30 mg into the skin every 8 (eight) weeks.  01/20/19   Tanda Rockers, MD  guaiFENesin (MUCINEX) 600 MG 12 hr tablet Take 1 tablet (600 mg total) by mouth 2 (two) times daily. 09/12/19   Florencia Reasons, MD  HYDROcodone-acetaminophen (NORCO/VICODIN) 5-325 MG tablet Take 1 tablet by mouth in the morning, at noon, in the evening, and at bedtime.  09/05/19    [provider]  ibuprofen (ADVIL) 800 MG tablet Take 800 mg by mouth 2 (two) times daily as needed for pain. 08/25/19   [provider]  lamoTRIgine (LAMICTAL) 200 MG tablet Take 200 mg by mouth at bedtime.     [provider]  lidocaine (LIDODERM) 5 % Place 1 patch onto the skin daily. Remove & Discard patch within 12 hours or as directed by MD 09/12/19   Florencia Reasons, MD  loratadine (CLARITIN) 10 MG tablet Take 10 mg by mouth every evening.     [provider]  methylphenidate 18 MG PO CR tablet Take 18 mg by mouth daily. Take with 54 mg tablet= 72mg  07/19/19   [provider]  methylphenidate 54 MG PO CR tablet Take 54 mg by mouth every morning. Take with 18 mg tablet= 72mg     [provider]  montelukast (SINGULAIR) 10 MG tablet Take 1 tablet (10 mg total) by mouth at bedtime. 04/17/19   Tanda Rockers, MD  pantoprazole (PROTONIX) 40 MG tablet Take 1 tablet (40 mg total) by mouth daily. Take 30-60 min before first meal of the day 07/25/19   Tanda Rockers, MD  predniSONE (DELTASONE) 10 MG tablet Take four tablets on day 1, three tablet on day 2, two tablet on day 3, one tablet on day 4, then stop. 09/12/19   Florencia Reasons, MD  QUEtiapine (SEROQUEL) 100 MG tablet Take 150 mg by mouth at bedtime. 08/18/19   [provider]  risperiDONE (RISPERDAL) 1 MG tablet Take 3 mg by mouth at bedtime.  07/06/17   [provider]  senna-docusate (SENOKOT-S) 8.6-50 MG tablet Take 1 tablet by mouth 2 (two) times daily. 09/12/19   Florencia Reasons, MD  Spacer/Aero-Holding Chambers (AEROCHAMBER MV) inhaler Use as instructed 03/14/18   Lauraine Rinne, NP  Tiotropium Bromide Monohydrate (SPIRIVA RESPIMAT) 2.5 MCG/ACT AERS Inhale 2 puffs into the lungs daily. Patient not taking: Reported on 09/08/2019 08/29/19   Tanda Rockers, MD  Tiotropium Bromide Monohydrate (SPIRIVA RESPIMAT) 2.5 MCG/ACT AERS Inhale 2 puffs into the lungs daily. 09/02/19   Tanda Rockers, MD  venlafaxine  XR (EFFEXOR-XR) 150 MG 24 hr capsule Take 150 mg by  mouth at bedtime.     [provider]  Vitamin D, Ergocalciferol, (DRISDOL) 50000 units CAPS capsule Take 50,000 Units by mouth every Saturday.  06/18/17   [provider]     Critical care time: 34 minutes

## 2019-09-29 ENCOUNTER — Inpatient Hospital Stay (HOSPITAL_COMMUNITY): Payer: BC Managed Care – PPO | Admitting: Physical Therapy

## 2019-09-29 ENCOUNTER — Inpatient Hospital Stay (HOSPITAL_COMMUNITY): Payer: BC Managed Care – PPO

## 2019-09-29 ENCOUNTER — Inpatient Hospital Stay (HOSPITAL_COMMUNITY): Payer: BC Managed Care – PPO | Admitting: Occupational Therapy

## 2019-09-29 DIAGNOSIS — F419 Anxiety disorder, unspecified: Secondary | ICD-10-CM

## 2019-09-29 DIAGNOSIS — R5381 Other malaise: Secondary | ICD-10-CM | POA: Diagnosis not present

## 2019-09-29 DIAGNOSIS — J9602 Acute respiratory failure with hypercapnia: Secondary | ICD-10-CM

## 2019-09-29 DIAGNOSIS — F319 Bipolar disorder, unspecified: Secondary | ICD-10-CM

## 2019-09-29 DIAGNOSIS — J9601 Acute respiratory failure with hypoxia: Secondary | ICD-10-CM

## 2019-09-29 LAB — BASIC METABOLIC PANEL
Anion gap: 10 (ref 5–15)
BUN: 10 mg/dL (ref 8–23)
CO2: 33 mmol/L — ABNORMAL HIGH (ref 22–32)
Calcium: 10.4 mg/dL — ABNORMAL HIGH (ref 8.9–10.3)
Chloride: 96 mmol/L — ABNORMAL LOW (ref 98–111)
Creatinine, Ser: 0.67 mg/dL (ref 0.44–1.00)
GFR calc Af Amer: >60 mL/min (ref >60)
GFR calc non Af Amer: >60 mL/min (ref >60)
Glucose, Bld: 94 mg/dL (ref 70–99)
Potassium: 3.9 mmol/L (ref 3.5–5.1)
Sodium: 139 mmol/L (ref 135–145)

## 2019-09-29 LAB — CBC
HCT: 38.1 % (ref 36.0–46.0)
Hemoglobin: 11.8 g/dL — ABNORMAL LOW (ref 12.0–15.0)
MCH: 32.2 pg (ref 26.0–34.0)
MCHC: 31 g/dL (ref 30.0–36.0)
MCV: 103.8 fL — ABNORMAL HIGH (ref 80.0–100.0)
Platelets: 315 10*3/uL (ref 150–400)
RBC: 3.67 MIL/uL — ABNORMAL LOW (ref 3.87–5.11)
RDW: 13.4 % (ref 11.5–15.5)
WBC: 8.9 10*3/uL (ref 4.0–10.5)
nRBC: 0 % (ref 0.0–0.2)

## 2019-09-29 LAB — PHOSPHORUS: Phosphorus: 4.1 mg/dL (ref 2.5–4.6)

## 2019-09-29 LAB — MAGNESIUM: Magnesium: 2.2 mg/dL (ref 1.7–2.4)

## 2019-09-29 MED ORDER — PREDNISONE 5 MG PO TABS: 10.0000 mg | ORAL_TABLET | Freq: Every day | ORAL | Status: DC

## 2019-09-29 MED ORDER — CHLORHEXIDINE GLUCONATE CLOTH 2 % EX PADS
6.0000 | MEDICATED_PAD | Freq: Every day | CUTANEOUS | Status: DC
  Administered 2019-09-29 – 2019-10-06 (×6): 6 via TOPICAL

## 2019-09-29 MED ORDER — HYDRALAZINE HCL 20 MG/ML IJ SOLN
5.0000 mg | Freq: Four times a day (QID) | INTRAMUSCULAR | Status: DC | PRN
  Administered 2019-09-29: 5 mg via INTRAVENOUS
  Filled 2019-09-29: qty 1

## 2019-09-29 MED ORDER — PREDNISONE 20 MG PO TABS: 20.0000 mg | ORAL_TABLET | Freq: Every day | ORAL | Status: DC

## 2019-09-29 MED ORDER — ALBUTEROL SULFATE (2.5 MG/3ML) 0.083% IN NEBU
2.5000 mg | INHALATION_SOLUTION | RESPIRATORY_TRACT | Status: DC | PRN
  Administered 2019-09-29 – 2019-09-30 (×4): 2.5 mg via RESPIRATORY_TRACT
  Filled 2019-09-29 (×4): qty 3

## 2019-09-29 MED ORDER — ALPRAZOLAM 0.25 MG PO TABS
0.2500 mg | ORAL_TABLET | Freq: Three times a day (TID) | ORAL | Status: DC | PRN
  Administered 2019-09-29 – 2019-09-30 (×3): 0.25 mg via ORAL
  Filled 2019-09-29 (×4): qty 1

## 2019-09-29 NOTE — Progress Notes (Signed)
Inpatient Rehabilitation Care Coordinator  Discharge Note  The overall goal for the admission was met for:   Discharge location: Yes. D/c to acute hospital due to medical reasons. Contact [facility] to discuss staff coming to hospital to bring gauge for concentrator, and re-education in the home for refilling oxygen concentrators. HHPT/OT order sent to [facility].     Length of Stay: Yes. 15 days.   Discharge activity level: Yes. Min A  Home/community participation: Yes. Limited  Services provided included: MD, RD, PT, OT, RN, CM, TR, Pharmacy, Neuropsych and SW  Financial Services: Private Insurance: [place]  Follow-up services arranged: Home Health: [facility] for HHPT/OT  Comments (or additional information): contact pt [phone]  Patient/Family verbalized understanding of follow-up arrangements: Yes  Individual responsible for coordination of the follow-up plan: Pt to have assistance with coordinating care needs.   Confirmed correct DME delivered: Rana Snare 09/29/2019    Rana Snare

## 2019-09-29 NOTE — Progress Notes (Signed)
NAME:  Julia Daniels, MRN:  [id number], DOB:  11/19/1950, LOS: 1 ADMISSION DATE:  09/28/2019, CONSULTATION DATE:  9/19 REFERRING MD:  Rehab MD, CHIEF COMPLAINT:  Dyspnea   Brief History   69 year old woman with hx of COPD/asthma overlap on fasenra followed by Dr. Melvyn Novas, anxiety disorder presenting from rehab with severe SOB.  Past Medical History  COPD/asthma overlap Anxiety disorder Former smoker Bipolar Class 3 obesity Chronic pain GERD  Significant Hospital Events   8/18-8/24: COPD flare admission 8/30-9/1: COPD flare admission and back pain, sent to rehab 9/1-9/19: IP rehab admission 9/19>> back to [facility] for breathing issues  Consults:  PCCM  Procedures:    Significant Diagnostic Tests:    Micro Data:  COVD neg 8/30  Antimicrobials:    Interim history/subjective:  Feels better Slept on BIPAP  Objective   Blood pressure 137/90, pulse 96, temperature 97.9 F (36.6 C), temperature source Oral, resp. rate (!) 32, height 5\' 3"  (1.6 m), weight 90.5 kg, SpO2 (!) 89 %.    Vent Mode: PCV;PSV FiO2 (%):  [40 %-100 %] 40 % Set Rate:  [15 bmp] 15 bmp PEEP:  [5 cmH20] 5 cmH20   Intake/Output Summary (Last 24 hours) at 09/29/2019 0931 Last data filed at 09/29/2019 0800 Gross per 24 hour  Intake 240 ml  Output 700 ml  Net -460 ml   Filed Weights   09/28/19 1100  Weight: 90.5 kg    Examination:  General:  Elderly female, resting comfortably in bed HENT: NCAT OP clear PULM: Poor air movement bilaterally, no wheezing, normal effort CV: RRR, no mgr GI: BS+, soft, nontender MSK: normal bulk and tone Neuro: awake, alert, no distress, MAEW  Resolved Hospital Problem list     Assessment & Plan:  Acute exacerbation of COPD > has COPD asthma overlap syndrome Significantly better today Severe airways disease at baseline, I wonder how much of her dyspnea is from exercise and muscular deconditioning Needs slow prednisone taper> prednisone 20mg  daily x 7 days,  then 10mg  daily x 7 days Continue brovana, pulmicort, yupelri Albuterol prn Out of bed D/c BIPAP Transition back to home regimen at time of hospital discharge  To progressive care  PCCM to sign off, call if questions.  Best practice:   Per TRH  Labs   CBC: Recent Labs  Lab 09/23/19 0827 09/28/19 1158 09/28/19 1256 09/29/19 0641  WBC 8.4  --  11.3* 8.9  NEUTROABS 5.7  --  10.6*  --   HGB 11.6* 11.9* 11.0* 11.8*  HCT 37.9 35.0* 36.2 38.1  MCV 103.3*  --  103.4* 103.8*  PLT 271  --  307 756    Basic Metabolic Panel: Recent Labs  Lab 09/23/19 0827 09/26/19 0612 09/28/19 1158 09/29/19 0641  NA 136  --  139 139  K 4.1  --  4.1 3.9  CL 99  --   --  96*  CO2 26  --   --  33*  GLUCOSE 128*  --   --  94  BUN 8  --   --  10  CREATININE 0.77 0.68  --  0.67  CALCIUM 9.8  --   --  10.4*  MG  --   --   --  2.2  PHOS  --   --   --  4.1   GFR: Estimated Creatinine Clearance: 73.8 mL/min (by C-G formula based on SCr of 0.67 mg/dL). Recent Labs  Lab 09/23/19 0827 09/28/19 1256 09/29/19 4332  WBC 8.4 11.3* 8.9    Liver Function Tests: No results for input(s): AST, ALT, ALKPHOS, BILITOT, PROT, ALBUMIN in the last 168 hours. No results for input(s): LIPASE, AMYLASE in the last 168 hours. No results for input(s): AMMONIA in the last 168 hours.  ABG    Component Value Date/Time   PHART 7.340 (L) 09/28/2019 1158   PCO2ART 74.7 (HH) 09/28/2019 1158   PO2ART 464 (H) 09/28/2019 1158   HCO3 40.0 (H) 09/28/2019 1158   TCO2 42 (H) 09/28/2019 1158   ACIDBASEDEF 0.6 12/31/2017 1642   O2SAT 100.0 09/28/2019 1158     Coagulation Profile: No results for input(s): INR, PROTIME in the last 168 hours.  Cardiac Enzymes: No results for input(s): CKTOTAL, CKMB, CKMBINDEX, TROPONINI in the last 168 hours.  HbA1C: Hgb A1c MFr Bld  Date/Time Value Ref Range Status  08/28/2019 05:06 AM 5.7 (H) 4.8 - 5.6 % Final    Comment:    (NOTE) Pre diabetes:           5.7%-6.4%  Diabetes:              >6.4%  Glycemic control for   <7.0% adults with diabetes   12/31/2017 01:17 PM 5.1 4.8 - 5.6 % Final    Comment:    (NOTE) Pre diabetes:          5.7%-6.4% Diabetes:              >6.4% Glycemic control for   <7.0% adults with diabetes     CBG: Recent Labs  Lab 09/28/19 1142 09/28/19 1950  GLUCAP 120* 134*     Critical care time: n/a     Roselie Awkward, MD [place] PCCM Pager: [phone] Cell: [phone] If no response, call [phone]

## 2019-09-29 NOTE — Progress Notes (Addendum)
PROGRESS NOTE  Julia Daniels EPP:[id number] DOB: 1950/11/12 DOA: 09/28/2019 PCP: Simona Huh, NP  Brief History   69 year old woman PMH COPD on intermittent oxygen at home, anxiety, who was at inpatient rehab developed acute shortness of breath and was transferred to inpatient for treatment for COPD exacerbation.  A & P  COPD exacerbation (HCC) --better after BiPAP last night --slow prednisone taper 20mg  x7, then 10mg  x7  --continue nebs, pulmicort, MDI --add albuterol PRN --hold off on BiPAP tongiht  Acute respiratory failure with hypoxia (HCC) --better, secondary to COPD, uses O2 intermittently at home  Anxiety --plays a role in SOB --continue Xanax PRN and Ativan for refractory anxiety --resume Effexor  Bipolar disorder, unspecified ([place]) --continue Lamictal and Seroquel  Acute respiratory failure with hypercapnia ([place]) --secondary to COPD exacerbation, resolved w/ BiPAP last night   Disposition Plan:  Discussion: improved; continue current Rx, observe tonight, if no need for BiPAP > return to CIR 9/21.  Status is: Inpatient  Remains inpatient appropriate because:Inpatient level of care appropriate due to severity of illness   Dispo: The patient is from: CIR              Anticipated d/c is to: CIR              Anticipated d/c date is: 1 day              Patient currently is not medically stable to d/c.  DVT prophylaxis: enoxaparin (LOVENOX) injection 40 mg Start: 09/28/19 1200 SCDs Start: 09/28/19 1100   Code Status: Full Code Family Communication: none  Murray Hodgkins, MD  [facility] Direct contact: see [email or web address] (further directions at bottom of note if needed) 7PM-7AM contact night coverage as at bottom of note 09/29/2019, 2:34 PM  LOS: 1 day   Significant Hospital Events   .    Consults:  . PCCM   Procedures:  .   Significant Diagnostic Tests:  Marland Kitchen    Micro Data:  .    Antimicrobials:  .   Interval History/Subjective  CC:  f/u SOB  Feels a lot better, slept w/ BiPAP last night Breathing better   No n/v Eating fine  Objective   Vitals:  Vitals:   09/29/19 1400 09/29/19 1424  BP: (!) 150/97   Pulse: (!) 117 (!) 114  Resp: (!) 31 (!) 31  Temp:    SpO2: 94% 92%    Exam:  Physical Exam Vitals reviewed.  Constitutional:      General: She is not in acute distress.    Appearance: Normal appearance. She is not ill-appearing or toxic-appearing.  Cardiovascular:     Rate and Rhythm: Normal rate and regular rhythm.     Heart sounds: Normal heart sounds.  Pulmonary:     Effort: No respiratory distress.     Breath sounds: No wheezing, rhonchi or rales.     Comments: Mild increased effort, fair air movement Musculoskeletal:     Right lower leg: No edema.     Left lower leg: No edema.  Neurological:     Mental Status: She is alert.  Psychiatric:        Mood and Affect: Mood normal.        Behavior: Behavior normal.      I have personally reviewed the following:   Today's Data  . BMP, Mg, phos WNL . hgb stable 11.8  Scheduled Meds: . arformoterol  15 mcg Nebulization BID  . atorvastatin  40 mg Oral Daily  .  baclofen  10 mg Oral TID  . budesonide (PULMICORT) nebulizer solution  0.25 mg Nebulization BID  . chlorhexidine  15 mL Mouth Rinse BID  . Chlorhexidine Gluconate Cloth  6 each Topical Daily  . enoxaparin (LOVENOX) injection  40 mg Subcutaneous Q24H  . famotidine  20 mg Oral QHS  . guaiFENesin  600 mg Oral BID  . lamoTRIgine  200 mg Oral QHS  . lidocaine  1 patch Transdermal Q24H  . loratadine  10 mg Oral QPM  . mouth rinse  15 mL Mouth Rinse q12n4p  . methylphenidate  72 mg Oral BH-q7a  . montelukast  10 mg Oral QHS  . pantoprazole  40 mg Oral Daily  . [START ON 09/30/2019] predniSONE  20 mg Oral Q breakfast   Followed by  . [START ON 10/07/2019] predniSONE  10 mg Oral Q breakfast  . QUEtiapine  150 mg Oral QHS  . revefenacin  175 mcg Nebulization Daily  . risperiDONE  3 mg  Oral QHS  . senna-docusate  1 tablet Oral BID  . venlafaxine XR  150 mg Oral QHS   Continuous Infusions:  Principal Problem:   COPD exacerbation (HCC) Active Problems:   Acute respiratory failure with hypoxia (HCC)   Acute respiratory failure with hypercapnia (HCC)   Bipolar disorder, unspecified ([place])   Anxiety   Depression   LOS: 1 day   How to contact the [facility] Attending or Consulting provider [place] or covering provider during after hours [place], for this patient?  1. Check the care team in [facility] and look for a) attending/consulting TR[facility] provider listed and b) the [facility] team listed 2. Log into [email or web address] and use [place]'s universal password to access. If you do not have the password, please contact the hospital operator. 3. Locate the [facility] provider you are looking for under [facility] and page to a number that you can be directly reached. 4. If you still have difficulty reaching the provider, please page the [facility] (Director on Call) for the Hospitalists listed on amion for assistance.

## 2019-09-29 NOTE — Plan of Care (Signed)

## 2019-09-29 NOTE — Assessment & Plan Note (Addendum)
--  now on BiPAP, SpO2 100% but marked distress --f/u ABG --Plan as per COPD

## 2019-09-29 NOTE — Assessment & Plan Note (Signed)
--  continue Lamictal and Seroquel

## 2019-09-29 NOTE — Hospital Course (Signed)
69 year old woman PMH COPD on intermittent oxygen at home, anxiety, who was at inpatient rehab developed acute shortness of breath and was transferred to inpatient for treatment for COPD exacerbation.

## 2019-09-29 NOTE — Assessment & Plan Note (Addendum)
--  complicates COPD --continue Xanax PRN if can take PO otherwise IV BZD --Effexor as able

## 2019-09-29 NOTE — Assessment & Plan Note (Addendum)
--  much worse this AM --continue solumedrol, nebs, BiPAP --d/w PCCM and asked to see for consideration of intubation --last CXR atelectasis, BS equal, no fever, no s/s sepsis. Last echo nl LVEF, RV --appears critically ill

## 2019-09-29 NOTE — Assessment & Plan Note (Addendum)
--  SpO2 100% on BiPAP

## 2019-09-30 ENCOUNTER — Inpatient Hospital Stay (HOSPITAL_COMMUNITY): Payer: BC Managed Care – PPO

## 2019-09-30 DIAGNOSIS — J441 Chronic obstructive pulmonary disease with (acute) exacerbation: Principal | ICD-10-CM

## 2019-09-30 LAB — BLOOD GAS, ARTERIAL
Acid-Base Excess: 7.6 mmol/L — ABNORMAL HIGH (ref 0.0–2.0)
Bicarbonate: 34.7 mmol/L — ABNORMAL HIGH (ref 20.0–28.0)
FIO2: 30
O2 Saturation: 94.3 %
Patient temperature: 37
pCO2 arterial: 81.8 mmHg (ref 32.0–48.0)
pH, Arterial: 7.25 — ABNORMAL LOW (ref 7.350–7.450)
pO2, Arterial: 84.9 mmHg (ref 83.0–108.0)

## 2019-09-30 MED ORDER — DEXMEDETOMIDINE HCL IN NACL 400 MCG/100ML IV SOLN
0.4000 ug/kg/h | INTRAVENOUS | Status: DC
  Administered 2019-09-30 (×2): 1.2 ug/kg/h via INTRAVENOUS
  Administered 2019-09-30: 0.4 ug/kg/h via INTRAVENOUS
  Administered 2019-10-01: 1.2 ug/kg/h via INTRAVENOUS
  Administered 2019-10-01: 0.7 ug/kg/h via INTRAVENOUS
  Administered 2019-10-01: 0.4 ug/kg/h via INTRAVENOUS
  Administered 2019-10-01: 1 ug/kg/h via INTRAVENOUS
  Administered 2019-10-02: 0.5 ug/kg/h via INTRAVENOUS
  Filled 2019-09-30 (×9): qty 100

## 2019-09-30 MED ORDER — LEVALBUTEROL HCL 0.63 MG/3ML IN NEBU: 0.6300 mg | INHALATION_SOLUTION | Freq: Four times a day (QID) | RESPIRATORY_TRACT | Status: DC

## 2019-09-30 MED ORDER — ALBUTEROL (5 MG/ML) CONTINUOUS INHALATION SOLN: 5.0000 mg/h | INHALATION_SOLUTION | RESPIRATORY_TRACT | Status: DC

## 2019-09-30 MED ORDER — LEVALBUTEROL HCL 0.63 MG/3ML IN NEBU
INHALATION_SOLUTION | RESPIRATORY_TRACT | Status: AC
  Administered 2019-09-30: 0.63 mg via RESPIRATORY_TRACT
  Filled 2019-09-30: qty 3

## 2019-09-30 MED ORDER — MORPHINE SULFATE (PF) 2 MG/ML IV SOLN: 2.0000 mg | Freq: Once | INTRAVENOUS | Status: DC

## 2019-09-30 MED ORDER — MORPHINE SULFATE (PF) 4 MG/ML IV SOLN
INTRAVENOUS | Status: AC
  Administered 2019-09-30: 2 mg
  Filled 2019-09-30: qty 1

## 2019-09-30 MED ORDER — METHYLPREDNISOLONE SODIUM SUCC 125 MG IJ SOLR
60.0000 mg | Freq: Four times a day (QID) | INTRAMUSCULAR | Status: DC
  Administered 2019-09-30 – 2019-10-01 (×5): 60 mg via INTRAVENOUS
  Filled 2019-09-30 (×5): qty 2

## 2019-09-30 MED ORDER — MORPHINE SULFATE (PF) 2 MG/ML IV SOLN
1.0000 mg | Freq: Once | INTRAVENOUS | Status: AC
  Administered 2019-09-30: 1 mg via INTRAVENOUS
  Filled 2019-09-30: qty 1

## 2019-09-30 MED ORDER — MORPHINE SULFATE (PF) 2 MG/ML IV SOLN
2.0000 mg | INTRAVENOUS | Status: DC | PRN
  Administered 2019-09-30 – 2019-10-02 (×4): 4 mg via INTRAVENOUS
  Administered 2019-10-02: 2 mg via INTRAVENOUS
  Administered 2019-10-06 (×2): 4 mg via INTRAVENOUS
  Administered 2019-10-07: 2 mg via INTRAVENOUS
  Administered 2019-10-07: 4 mg via INTRAVENOUS
  Filled 2019-09-30: qty 2
  Filled 2019-09-30: qty 1
  Filled 2019-09-30 (×5): qty 2
  Filled 2019-09-30: qty 1
  Filled 2019-09-30: qty 2

## 2019-09-30 MED ORDER — ALBUTEROL (5 MG/ML) CONTINUOUS INHALATION SOLN
5.0000 mg/h | INHALATION_SOLUTION | RESPIRATORY_TRACT | Status: DC
  Filled 2019-09-30: qty 20

## 2019-09-30 MED ORDER — LEVALBUTEROL HCL 0.63 MG/3ML IN NEBU
0.6300 mg | INHALATION_SOLUTION | RESPIRATORY_TRACT | Status: DC | PRN
  Administered 2019-09-30: 0.63 mg via RESPIRATORY_TRACT
  Filled 2019-09-30: qty 3

## 2019-09-30 MED ORDER — LEVALBUTEROL HCL 0.63 MG/3ML IN NEBU
0.6300 mg | INHALATION_SOLUTION | Freq: Four times a day (QID) | RESPIRATORY_TRACT | Status: DC | PRN
  Administered 2019-09-30: 0.63 mg via RESPIRATORY_TRACT

## 2019-09-30 NOTE — Plan of Care (Signed)

## 2019-09-30 NOTE — Progress Notes (Signed)
Floor coverage overnight event  Patient admitted for acute hypoxic respiratory failure secondary to COPD exacerbation.  Paged by nursing staff as patient was in respiratory distress.  She was seen and examined at bedside.  Tripoding, speaking in one-word sentences.  Denied chest pain.  Tachypneic with respiratory rate in the 40s, tachycardic with heart rate in the 130s.  Satting 100% on 5 L supplemental oxygen.  Very diminished breath sounds and wheezing appreciated on auscultation of the lungs.  -Levalbuterol nebulizer treatment -IV morphine given for increased work of breathing -Start BiPAP -Stat ABG ordered -Discontinue prednisone.  Start Solu-Medrol 60 mg every 6 hours. -Continue Brovana, Pulmicort, Yupelri -Continue SABA as needed -Currently in the progressive care unit, continue to monitor very closely.  Low threshold to call PCCM if no improvement with treatment plan mentioned above.

## 2019-09-30 NOTE — Progress Notes (Addendum)
PROGRESS NOTE  Julia Daniels DUK:[id number] DOB: 02/20/1950 DOA: 09/28/2019 PCP: Simona Huh, NP  Brief History   69 year old woman PMH COPD on intermittent oxygen at home, anxiety, who was at inpatient rehab developed acute shortness of breath and was transferred to inpatient for treatment for COPD exacerbation.  A & P  COPD exacerbation (HCC) --much worse this AM --continue solumedrol, nebs, BiPAP --d/w PCCM and asked to see for consideration of intubation --last CXR atelectasis, BS equal, no fever, no s/s sepsis. Last echo nl LVEF, RV --appears critically ill  Acute respiratory failure with hypoxia (HCC) --SpO2 100% on BiPAP  Anxiety --complicates COPD --continue Xanax PRN if can take PO otherwise IV BZD --Effexor as able  Bipolar disorder, unspecified ([place]) --continue Lamictal and Seroquel  Acute respiratory failure with hypercapnia (HCC) --now on BiPAP, SpO2 100% but marked distress --f/u ABG --Plan as per COPD   Disposition Plan:  Discussion: in distress today. Continue BiPAP, f/u ABG and PCCM recs. I d/w son and updated on critical illness and possible need for intubation. He is POA and to his knowledge she is full code. I d/w Dr. Silas Flood and Richardson Landry Minor NP.  ADDENDUM Discussed w/ Dr. Silas Flood, agree w/ transfer to ICU under PCCM service. I will sign off and appreciate PCCM care.  Status is: Inpatient  Remains inpatient appropriate because:Inpatient level of care appropriate due to severity of illness   Dispo: The patient is from: CIR              Anticipated d/c is to: CIR              Anticipated d/c date is: 1 day              Patient currently is not medically stable to d/c.  DVT prophylaxis: enoxaparin (LOVENOX) injection 40 mg Start: 09/28/19 1200 SCDs Start: 09/28/19 1100   Code Status: Full Code Family Communication: none  Murray Hodgkins, MD  [facility] Direct contact: see [email or web address] (further directions at bottom of note if  needed) 7PM-7AM contact night coverage as at bottom of note 09/30/2019, 8:54 AM  LOS: 2 days   Significant Hospital Events    9/19 admit w/ resp distress secondary to COPD exacerbation complicated by anxiety   Consults:   PCCM   Procedures:     Significant Diagnostic Tests:   9/19 CXR atelectasis    Micro Data:   COVID negative   Antimicrobials:   None   Interval History/Subjective  CC: f/u SOB  Chart reviewed, struggled overnight w/ RR up to 30s, anxiety, tachycardia. Seen by RR and overnight MD, noted to be in severe resp distress, couldn't tolerate BiPAP; too agitated for ABG. IV steroids added.  RN this AM put on BiPAP  Pt on BiPAP, in severe distress, withdraws to pain but can't offer history.  Objective   Vitals:  Vitals:   09/30/19 0739 09/30/19 0806  BP: (!) 143/94   Pulse: (!) 130 (!) 127  Resp: (!) 35 (!) 35  Temp: 99.6 F (37.6 C)   SpO2: 100% 100%    Exam:  Physical Exam Vitals reviewed.  Constitutional:      General: She is in acute distress.     Appearance: Normal appearance. She is ill-appearing and toxic-appearing.  Cardiovascular:     Rate and Rhythm: Regular rhythm. Tachycardia present.     Heart sounds: Normal heart sounds. No murmur heard.  No friction rub. No gallop.   Pulmonary:     Effort:  Respiratory distress present.     Breath sounds: Wheezing present. No rhonchi.     Comments: Prolonged expiration, very poor air movement Abdominal:     Palpations: Abdomen is soft.  Musculoskeletal:     Right lower leg: No edema.     Left lower leg: No edema.     Comments: Moves UE spontaneously  Skin:    General: Skin is warm and dry.  Neurological:     Mental Status: She is alert.  Psychiatric:     Comments: Cannot assess mood; pulling at BiPAP      I have personally reviewed the following:   Today's Data   No new labs   ABG pending  Scheduled Meds:  arformoterol  15 mcg Nebulization BID   atorvastatin  40 mg Oral  Daily   baclofen  10 mg Oral TID   budesonide (PULMICORT) nebulizer solution  0.25 mg Nebulization BID   chlorhexidine  15 mL Mouth Rinse BID   Chlorhexidine Gluconate Cloth  6 each Topical Daily   enoxaparin (LOVENOX) injection  40 mg Subcutaneous Q24H   famotidine  20 mg Oral QHS   guaiFENesin  600 mg Oral BID   lamoTRIgine  200 mg Oral QHS   lidocaine  1 patch Transdermal Q24H   loratadine  10 mg Oral QPM   mouth rinse  15 mL Mouth Rinse q12n4p   methylphenidate  72 mg Oral BH-q7a   methylPREDNISolone (SOLU-MEDROL) injection  60 mg Intravenous Q6H   montelukast  10 mg Oral QHS   pantoprazole  40 mg Oral Daily   QUEtiapine  150 mg Oral QHS   revefenacin  175 mcg Nebulization Daily   risperiDONE  3 mg Oral QHS   senna-docusate  1 tablet Oral BID   venlafaxine XR  150 mg Oral QHS   Continuous Infusions:  Principal Problem:   COPD exacerbation (HCC) Active Problems:   Acute respiratory failure with hypoxia (HCC)   Acute respiratory failure with hypercapnia (HCC)   Bipolar disorder, unspecified ([place])   Anxiety   Depression   LOS: 2 days   How to contact the [facility] Attending or Consulting provider 7A - 7P or covering provider during after hours [place], for this patient?  1. Check the care team in [facility] and look for a) attending/consulting [facility] provider listed and b) the [facility] team listed 2. Log into [email or web address] and use [place]'s universal password to access. If you do not have the password, please contact the hospital operator. 3. Locate the [facility] provider you are looking for under [facility] and page to a number that you can be directly reached. 4. If you still have difficulty reaching the provider, please page the [facility] (Director on Call) for the Hospitalists listed on amion for assistance.

## 2019-09-30 NOTE — Progress Notes (Signed)
Entered room to find patient in respiratory distress. Patient was not wearing BiPap, IV and leads pulled off. I quickly assessed and placed the patient on BiPap. Went over to ICU and grabbed a few extra hands. Patient continued to decompensate. Patient was not moving air and was very anxious. RT and Rapid was called. IV was placed in the right hand and Ativan was given. RT arrived and assessed, gave Nebs and collected Stat ABG. Dr. Sarajane Jews called Critical Care and orders placed for transfer to [place]. Report was given to [facility] and patient transferred at 9:50.

## 2019-09-30 NOTE — Progress Notes (Signed)
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 Melvyn Novas, anxiety disorder presenting from rehab with severe SOB.  Past Medical History  COPD/asthma overlap Anxiety disorder Former smoker Bipolar Class 3 obesity Chronic pain GERD  Significant Hospital Events   8/18-8/24: COPD flare admission 8/30-9/1: COPD flare admission and back pain, sent to rehab 9/1-9/19: IP rehab admission 9/19>> back to [facility] for breathing issues  Consults:  PCCM  Procedures:    Significant Diagnostic Tests:    Micro Data:  COVD neg 8/30  Antimicrobials:    Interim history/subjective:  Feels better Slept on BIPAP  Objective   Blood pressure (!) 143/94, pulse (!) 127, temperature 99.6 F (37.6 C), temperature source Axillary, resp. rate (!) 35, height 5\' 3"  (1.6 m), weight 92.9 kg, SpO2 100 %.    Vent Mode: PCV;BIPAP FiO2 (%):  [30 %-50 %] 30 % Set Rate:  [15 bmp] 15 bmp PEEP:  [5 cmH20] 5 cmH20   Intake/Output Summary (Last 24 hours) at 09/30/2019 0856 Last data filed at 09/30/2019 0000 Gross per 24 hour  Intake --  Output 1400 ml  Net -1400 ml   Filed Weights   09/28/19 1100 09/30/19 0317  Weight: 90.5 kg 92.9 kg    Examination: General: Morbid obese female is in obvious respiratory distress HEENT: Edentulous, no JVD or lymphadenopathy is appreciated Neuro: Able to answer questions but not appropriately CV: Heart sounds are distant PULM: Currently on noninvasive mechanical ventilatory  Abdominal chest wall paradoxu  GI: soft, bsx4 active obese Extremities: warm/dry, 1+ edema  Skin: no rashes or lesions   Resolved Hospital Problem list     Assessment & Plan:  Acute exacerbation of COPD > has COPD asthma overlap syndrome Severe airways disease at  baseline  deconditioning Acute exacerbation and acute respiratory distress 09/30/2019 Currently needs intubation but after discussion with patient she does not want to be intubated see below for actions taken BiPAP as tolerated Morphine sulfate 2 mg now another 2 mg as needed Ongoing discussions with son concerning CODE STATUS but she does not wish to be intubated and placed on the ventilator at this time.  She made this perfectly clear to more than 3 healthcare providers at bedside. Moved to the intensive care unit we will give her Precedex morphine to help with her anxiety with breathing Try to avoid intubation Continue steroids Continue bronchodilators  Anxiety, bipolar Mood the intensive care unit As needed morphine Precedex Ongoing discussions with his son concerning intubation versus comfort care  Best practice:   Per TRH  Labs   CBC: Recent Labs  Lab 09/28/19 1158 09/28/19 1256 09/29/19 0641  WBC  --  11.3* 8.9  NEUTROABS  --  10.6*  --   HGB 11.9* 11.0* 11.8*  HCT 35.0* 36.2 38.1  MCV  --  103.4* 103.8*  PLT  --  307 417    Basic Metabolic Panel: Recent Labs  Lab 09/26/19 0612 09/28/19 1158 09/29/19 0641  NA  --  139 139  K  --  4.1 3.9  CL  --   --  96*  CO2  --   --  33*  GLUCOSE  --   --  94  BUN  --   --  10  CREATININE  0.68  --  0.67  CALCIUM  --   --  10.4*  MG  --   --  2.2  PHOS  --   --  4.1   GFR: Estimated Creatinine Clearance: 74.9 mL/min (by C-G formula based on SCr of 0.67 mg/dL). Recent Labs  Lab 09/28/19 1256 09/29/19 0641  WBC 11.3* 8.9    Liver Function Tests: No results for input(s): AST, ALT, ALKPHOS, BILITOT, PROT, ALBUMIN in the last 168 hours. No results for input(s): LIPASE, AMYLASE in the last 168 hours. No results for input(s): AMMONIA in the last 168 hours.  ABG    Component Value Date/Time   PHART 7.250 (L) 09/30/2019 0829   PCO2ART 81.8 (HH) 09/30/2019 0829   PO2ART 84.9 09/30/2019 0829   HCO3 34.7 (H)  09/30/2019 0829   TCO2 42 (H) 09/28/2019 1158   ACIDBASEDEF 0.6 12/31/2017 1642   O2SAT 94.3 09/30/2019 0829     Coagulation Profile: No results for input(s): INR, PROTIME in the last 168 hours.  Cardiac Enzymes: No results for input(s): CKTOTAL, CKMB, CKMBINDEX, TROPONINI in the last 168 hours.  HbA1C: Hgb A1c MFr Bld  Date/Time Value Ref Range Status  08/28/2019 05:06 AM 5.7 (H) 4.8 - 5.6 % Final    Comment:    (NOTE) Pre diabetes:          5.7%-6.4%  Diabetes:              >6.4%  Glycemic control for   <7.0% adults with diabetes   12/31/2017 01:17 PM 5.1 4.8 - 5.6 % Final    Comment:    (NOTE) Pre diabetes:          5.7%-6.4% Diabetes:              >6.4% Glycemic control for   <7.0% adults with diabetes     CBG: Recent Labs  Lab 09/28/19 1142 09/28/19 1950  GLUCAP 120* 134*     Critical care time: 35 min     Richardson Landry Keaundre Thelin ACNP Acute Care Nurse Practitioner [place] Please consult Amion 09/30/2019, 8:56 AM

## 2019-09-30 NOTE — Progress Notes (Signed)
Pt rec'd stat Xopenex neb. Patient placed on BIPAP due to wob.  She took BIPAP off attempted ABG on patient she jerks and is not allowing me to stick her safely.  MD aware.  RT will attempt again once patient is calm.

## 2019-09-30 NOTE — Progress Notes (Signed)
Pt received neb at 0402 and 2 minutes after pt endorsing she needs another. Audible crackles, ex wheezing, and a non-productive conti cough. HR 125, RR up to 30s. Dr. Marlowe Sax informed.  See new orders.

## 2019-09-30 NOTE — Significant Event (Signed)
Rapid Response Event Note   Reason for Call :  Respiratory distress Per Staff patient had removed her Bipap and pulled out IV, anxious.  Placed on Bipap and given ativan  Initial Focused Assessment:  Patient on Bipap with increased Work of Breathing and  Paradoxical breathing.  Lung sounds decreased through out BP 143/94  ST 122  RR 35 O2 sats 99% on Bipap She is still very restless and constantly trying to move or take the mask off her face.  Interventions:  ABG done PXCR done Neb treatment given Maintained patient safety while on bipap  Dr Silas Flood (CCM) at bedside to assess patient Son came to bedside side.  Spoke with MD regarding his mother's care and treatment plan.    Plan of Care:   Transfer to ICU for precedex gtt while on Bipap  Event Summary:   MD Notified: Dr Sarajane Jews Call Time:  [phone] Arrival Time:  0745 End Time:  [place]  Raliegh Ip, RN

## 2019-09-30 NOTE — Significant Event (Signed)
Rapid Response Event Note   Reason for Call : Respiratory failure, increased WOB  Initial Focused Assessment:  Notified by Sharyn Lull RRT of pt with increased WOB and respiratory distress despite prn Neb and BIPAP trial. Reportedly pt would not tolerate BIPAP and was pulling off her mask. Dr. Marlowe Sax at bedside. Upon arrival, Ms. Trefz is alert, in significant distress, RR 30s with forced exhalation and abdominal accessory muscle use. BBS Coarse Rhonchi. Skin pink, warm and diaphoretic. Denies CP and endorses SOB. Additional Neb tx, Solumedrol and morphine given. BIPAP attempted again and pt kept it on for 15-20 mins. She then refused to keep it on and was transitioned back to 5L [place]. We attempted a short stay in the chair in upright position which was short lived. Pt was pivoted back to the bed and placed in semi-fowlers position. Ms. Blok is answering questions appropriately however using poor judgement pulling off medical devices and trying to get OOB. Pt is at significant risk of respiratory arrest.   0600- 97.97F, HR 128 ST, 153/93 (107), RR 26-32 with sats 96% on 5L .    Interventions:  -BIPAP (attempted) -Solumedrol -Morphine -Xanax prn  Plan of Care:  -Reattempt BIPAP if tolerated, may need additional anxiolytic  -If patient is not cooperating with POC, GOC need to be reassessed for a plan that is agreeable to the patient.      MD Notified: Dr. Marlowe Sax at bedside Call Time: 0451 Arrival Time:  1660 End Time: 0630  Madelynn Done, RN

## 2019-09-30 NOTE — Progress Notes (Signed)
RT note-Patient moved to 4N19 with Bipap.

## 2019-10-01 ENCOUNTER — Inpatient Hospital Stay (HOSPITAL_COMMUNITY): Payer: BC Managed Care – PPO

## 2019-10-01 DIAGNOSIS — R069 Unspecified abnormalities of breathing: Secondary | ICD-10-CM

## 2019-10-01 LAB — BASIC METABOLIC PANEL
Anion gap: 11 (ref 5–15)
BUN: 27 mg/dL — ABNORMAL HIGH (ref 8–23)
CO2: 29 mmol/L (ref 22–32)
Calcium: 10.1 mg/dL (ref 8.9–10.3)
Chloride: 99 mmol/L (ref 98–111)
Creatinine, Ser: 0.7 mg/dL (ref 0.44–1.00)
GFR calc Af Amer: >60 mL/min (ref >60)
GFR calc non Af Amer: >60 mL/min (ref >60)
Glucose, Bld: 134 mg/dL — ABNORMAL HIGH (ref 70–99)
Potassium: 4.3 mmol/L (ref 3.5–5.1)
Sodium: 139 mmol/L (ref 135–145)

## 2019-10-01 LAB — MAGNESIUM: Magnesium: 2.2 mg/dL (ref 1.7–2.4)

## 2019-10-01 LAB — PHOSPHORUS: Phosphorus: 3.8 mg/dL (ref 2.5–4.6)

## 2019-10-01 MED ORDER — ALPRAZOLAM 0.25 MG PO TABS
0.2500 mg | ORAL_TABLET | Freq: Three times a day (TID) | ORAL | Status: DC
  Administered 2019-10-01 – 2019-10-07 (×19): 0.25 mg via ORAL
  Filled 2019-10-01 (×20): qty 1

## 2019-10-01 MED ORDER — METHYLPREDNISOLONE SODIUM SUCC 40 MG IJ SOLR
40.0000 mg | Freq: Four times a day (QID) | INTRAMUSCULAR | Status: DC
  Administered 2019-10-01 – 2019-10-03 (×8): 40 mg via INTRAVENOUS
  Filled 2019-10-01 (×8): qty 1

## 2019-10-01 MED ORDER — BUDESONIDE 0.5 MG/2ML IN SUSP
0.5000 mg | Freq: Two times a day (BID) | RESPIRATORY_TRACT | Status: DC
  Administered 2019-10-01 – 2019-10-07 (×13): 0.5 mg via RESPIRATORY_TRACT
  Filled 2019-10-01 (×13): qty 2

## 2019-10-01 NOTE — Progress Notes (Signed)
   10/01/19 1340  Clinical Encounter Type  Visited With Patient  Visit Type Follow-up  Referral From Nurse  Consult/Referral To Chaplain   Chaplain followed-up with Pt regarding AD. Pt had discussed things further with her nurse and wanted to speak with her son before filling anything out. If/when the paperwork is ready, Chaplain remains available to complete the process.   This note was prepared by Chaplain Resident, Dante Gang, MDiv. For questions, please contact by phone at [phone].

## 2019-10-01 NOTE — Progress Notes (Signed)
   10/01/19 1000  Clinical Encounter Type  Visited With Patient  Visit Type Initial;Social support  Referral From Nurse  Consult/Referral To Chaplain   Pt requested information on an AD. Chaplain started the education portion and answered the Pt's questions. Chaplain will return later to finish the education piece.  This note was prepared by Chaplain Resident, Dante Gang, MDiv. For questions, please contact by phone at [phone].

## 2019-10-01 NOTE — Plan of Care (Signed)
  Problem: Clinical Measurements: Goal: Will remain free from infection Outcome: Progressing Goal: Respiratory complications will improve Outcome: Progressing Goal: Cardiovascular complication will be avoided Outcome: Progressing   Problem: Coping: Goal: Level of anxiety will decrease Outcome: Progressing   Problem: Pain Managment: Goal: General experience of comfort will improve Outcome: Progressing   Problem: Safety: Goal: Ability to remain free from injury will improve Outcome: Progressing   Problem: Skin Integrity: Goal: Risk for impaired skin integrity will decrease Outcome: Progressing   Problem: Activity: Goal: Risk for activity intolerance will decrease Outcome: Not Progressing

## 2019-10-01 NOTE — Progress Notes (Signed)
PT Cancellation Note  Patient Details Name: Julia Daniels MRN: [id number] DOB: 1950-01-30   Cancelled Treatment:    Reason Eval/Treat Not Completed: Medical issues which prohibited therapy; patient reports getting SOB reaching and sipping her water, RN reports desaturations with moving in the bed on 2L O2 and pt reports son here to discuss "important things".  Will hold on PT eval till another day.   Reginia Naas 10/01/2019, 4:46 PM  Magda Kiel, PT Acute Rehabilitation Services [phone] Office:[phone] 10/01/2019

## 2019-10-01 NOTE — Progress Notes (Signed)
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 Melvyn Novas, anxiety disorder presenting from rehab with severe SOB on BiPAP- requiring tx to ICU for possible intubation and precedex for severe anxiety.  Patient stated she would never want intubation/ MV; made DNI/ DNR.    Past Medical History  COPD/asthma overlap Anxiety disorder Former smoker Bipolar Class 3 obesity Chronic pain GERD  Significant Hospital Events   8/18-8/24: COPD flare admission 8/30-9/1: COPD flare admission and back pain, sent to rehab 9/1-9/19: IP rehab admission 9/19>> back to [facility] for breathing issues-> to ICU for worsening resp distress/ anxiety, on precedex.  Patient made clear she would not want intubation/ MV; DNR/ DNI  Consults:  PCCM  Procedures:    Significant Diagnostic Tests:    Micro Data:  COVD neg 8/30  Antimicrobials:    Interim history/subjective:  Afebrile  -369/ net - 2.2L precedex 1 mcg/kg/hr Remained on BiPAP overnight 18/8, 30% Required I/O cath overnight for urinary retention Did not get any PO meds last night. Received morphine and ativan x 1 overnight.  Patient states she slept well overnight.  Does not recall events from yesterday.  States her anxiety prior to PT and back pain cause her breathing to get bad.  Son, [name], at bedside. Both patient and son confirm DNR/ DNI- witnessed by Claiborne Billings, RN  Objective   Blood pressure (!) 151/78, pulse 85, temperature 99 F (37.2 C), temperature source Axillary, resp. rate (!) 23, height 5\' 3"  (1.6 m), weight 92.9 kg, SpO2 96 %.    FiO2 (%):  [30 %] 30 %   Intake/Output Summary (Last 24 hours) at 10/01/2019 0831 Last data filed at 10/01/2019 0700 Gross per 24 hour  Intake 530.41 ml  Output 900 ml  Net  -369.59 ml   Filed Weights   09/28/19 1100 09/30/19 0317  Weight: 90.5 kg 92.9 kg   Examination: General:  Obese female sitting upright in bed in NAD on BiPAP HEENT: pupils 3/reactive, anicteric, full fitting face mask Neuro: Awake, oriented x 3, MAE CV: NSR, no murmur PULM:  Non labored on 18/8, TV ~400-450, clear and diminished with occasional exp wheeze on left GI: soft, +bs, NT/ ND, [place]wick Extremities: warm/dry, no LE edema  Skin: no rashes   Resolved Hospital Problem list     Assessment & Plan:  Acute exacerbation of COPD > has COPD asthma overlap syndrome Severe airways disease at baseline  deconditioning Acute exacerbation and acute respiratory distress 09/30/2019 Currently needs intubation but after discussion with patient she does not want to be intubated see below for actions taken Attempt trial off BiPAP to [place], baseline 2L [place] Supplemental O2 for sat goal > 88% (currently on 30%) Continue NPO except meds; will resume psych/ PO meds to help faciliate precedex wean Decrease solumedrol to 40 mg q12 Continue xopenex/ revefenacin, brovanna,  Continue Singulair Aggressive bronchial hygiene Push PT efforts Remains DNR/ DNI Prn morphine for air hunger precedex as below  Anxiety, bipolar Weaning precedex today, RASS goal 0 Resume home meds- Risperdal, Seroquel, Lamictal, Concerta, and Effexor   Back pain PRN tylenol, norco for severe pain Continue baclofen  Best practice:  Diet: NPO except meds Pain/Anxiety/Delirium protocol (if indicated): weaning precedex VAP protocol (if indicated): n/a  DVT prophylaxis: lovenox GI prophylaxis: pepcid Glucose control: trend on BMET Mobility: PT ordered Code Status: DNI/ DNR Family Communication: Son, [name] updated at bedside Disposition: ICU for now till off precedex   Labs   CBC: Recent Labs  Lab 09/28/19 1158 09/28/19 1256 09/29/19 0641  WBC  --  11.3* 8.9  NEUTROABS  --  10.6*  --   HGB 11.9* 11.0* 11.8*  HCT  35.0* 36.2 38.1  MCV  --  103.4* 103.8*  PLT  --  307 625    Basic Metabolic Panel: Recent Labs  Lab 09/26/19 0612 09/28/19 1158 09/29/19 0641  NA  --  139 139  K  --  4.1 3.9  CL  --   --  96*  CO2  --   --  33*  GLUCOSE  --   --  94  BUN  --   --  10  CREATININE 0.68  --  0.67  CALCIUM  --   --  10.4*  MG  --   --  2.2  PHOS  --   --  4.1   GFR: Estimated Creatinine Clearance: 74.9 mL/min (by C-G formula based on SCr of 0.67 mg/dL). Recent Labs  Lab 09/28/19 1256 09/29/19 0641  WBC 11.3* 8.9    Liver Function Tests: No results for input(s): AST, ALT, ALKPHOS, BILITOT, PROT, ALBUMIN in the last 168 hours. No results for input(s): LIPASE, AMYLASE in the last 168 hours. No results for input(s): AMMONIA in the last 168 hours.  ABG    Component Value Date/Time   PHART 7.250 (L) 09/30/2019 0829   PCO2ART 81.8 (HH) 09/30/2019 0829   PO2ART 84.9 09/30/2019 0829   HCO3 34.7 (H) 09/30/2019 0829   TCO2 42 (H) 09/28/2019 1158   ACIDBASEDEF 0.6 12/31/2017 1642   O2SAT 94.3 09/30/2019 0829     Coagulation Profile: No results for input(s): INR, PROTIME in the last 168 hours.  Cardiac Enzymes: No results for input(s): CKTOTAL, CKMB, CKMBINDEX, TROPONINI in the last 168 hours.  HbA1C: Hgb A1c MFr Bld  Date/Time Value Ref Range Status  08/28/2019 05:06 AM 5.7 (H) 4.8 - 5.6 % Final    Comment:    (NOTE) Pre diabetes:          5.7%-6.4%  Diabetes:              >6.4%  Glycemic control for   <7.0% adults with diabetes   12/31/2017 01:17 PM 5.1 4.8 - 5.6 % Final    Comment:    (NOTE) Pre diabetes:          5.7%-6.4% Diabetes:              >6.4% Glycemic control for   <7.0% adults with diabetes     CBG: Recent Labs  Lab 09/28/19 1142 09/28/19 1950  GLUCAP 120* 134*     Critical care time: 35 mins     Kennieth Rad, MSN, AGACNP-BC  Pulmonary & Critical Care 10/01/2019, 8:31 AM  See Shea Evans for personal pager PCCM on call pager [phone]

## 2019-10-02 DIAGNOSIS — J9602 Acute respiratory failure with hypercapnia: Secondary | ICD-10-CM

## 2019-10-02 MED ORDER — ONDANSETRON HCL 4 MG/2ML IJ SOLN
4.0000 mg | Freq: Four times a day (QID) | INTRAMUSCULAR | Status: DC | PRN
  Administered 2019-10-02: 4 mg via INTRAVENOUS
  Filled 2019-10-02: qty 2

## 2019-10-02 MED ORDER — DOCUSATE SODIUM 100 MG PO CAPS
100.0000 mg | ORAL_CAPSULE | Freq: Two times a day (BID) | ORAL | Status: DC
  Administered 2019-10-02 – 2019-10-07 (×8): 100 mg via ORAL
  Filled 2019-10-02 (×10): qty 1

## 2019-10-02 NOTE — Plan of Care (Signed)
Transferred to Acute

## 2019-10-02 NOTE — Evaluation (Signed)
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 Melvyn Novas, anxiety disorder presenting from rehab with severe SOB on BiPAP- requiring tx to ICU for possible intubation and precedex for severe anxiety.  Patient stated she would never want intubation/ MV; made DNI/ DNR.  Clinical Impression  Pt presents to PT with deficits in functional mobility, endurance, strength, power, cognition, and with chronic back pain. Pt is limited by anxiety during session, expressing a fear of back pain and anxiety which could result in further respiratory impairments. PT encourages pursed lip breathing and use of incentive spirometer to improve pulmonary function. PT encourages the pt to communicate with therapists and medical team when mobilizing and will likely perform better when given more control of mobility during sessions. PT will further assess mobility during next session to determine out of bed mobility capabilities. At this time PT recommends SNF placement as the pt has limited caregiver support during the day and has not demonstrated much functional mobility tolerance at this time.    Follow Up Recommendations SNF (may progress to home but with limited caregiver support)    Equipment Recommendations  Rolling walker with 5" wheels    Recommendations for Other Services       Precautions / Restrictions Precautions Precautions: Fall;Back Precaution Comments: T6 compression fx, COPD, anxiety Required Braces or Orthoses: Spinal Brace Spinal Brace: Thoracolumbosacral orthotic;Applied in sitting position Restrictions Weight Bearing Restrictions: No      Mobility  Bed Mobility Overal bed mobility: Needs Assistance Bed Mobility: Rolling Rolling: Supervision         General bed mobility comments: pt declines attempts at  sitting edge of bed due to fear of anxiety and pain causing her further respiratory distress  Transfers                    Ambulation/Gait                Stairs            Wheelchair Mobility    Modified Rankin (Stroke Patients Only)       Balance                                             Pertinent Vitals/Pain Faces Pain Scale: Hurts even more Pain Location: thighs Pain Descriptors / Indicators: Sore Pain Intervention(s): Monitored during session    Home Living Family/patient expects to be discharged to:: Private residence Living Arrangements: Children Available Help at Discharge: Family;Available PRN/intermittently Type of Home: House Home Access: Stairs to enter Entrance Stairs-Rails: Right Entrance Stairs-Number of Steps: 4 Home Layout: One level Home Equipment: Walker - 2 wheels;Bedside commode;Other (comment) (bed rails)      Prior Function Level of Independence: Independent         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, was walking with a walker in rehab     [place] Hand: Right    Extremity/Trunk Assessment   Upper Extremity Assessment Upper Extremity Assessment: Generalized weakness (grossly 4/5 BUE)    Lower Extremity Assessment Lower Extremity Assessment: Generalized weakness (4/5 PF/DF, 4-/5 knee extension)    Cervical / Trunk Assessment Cervical / Trunk Exceptions:  rounded shoulders  Communication   Communication: Other (comment) (intermittent breaks from talking due to increased WOB)  Cognition Arousal/Alertness: Awake/alert Behavior During Therapy: Anxious Overall Cognitive Status: Within Functional Limits for tasks assessed                                        General Comments General comments (skin integrity, edema, etc.): pt on 3L [place], SpO2 ranging from88-93% during session, Pt's RR is labile during session, ranging  from 17-35 during session. PT encourages breaks and pursed lip breathing technique which does result in reduced RR and improved saturation    Exercises General Exercises - Lower Extremity Ankle Circles/Pumps: AROM;Both;10 reps Gluteal Sets: AROM;Both;5 reps Straight Leg Raises: AROM;Both;5 reps Other Exercises Other Exercises: incentive spirometer use, 5 reps, hourly   Assessment/Plan    PT Assessment Patient needs continued PT services  PT Problem List Decreased strength;Decreased activity tolerance;Decreased balance;Decreased mobility;Decreased knowledge of use of DME;Decreased safety awareness;Decreased knowledge of precautions;Cardiopulmonary status limiting activity;Pain       PT Treatment Interventions DME instruction;Gait training;Stair training;Functional mobility training;Therapeutic activities;Therapeutic exercise;Balance training;Neuromuscular re-education;Cognitive remediation;Patient/family education    PT Goals (Current goals can be found in the Care Plan section)  Acute Rehab PT Goals Patient Stated Goal: to improve mobility and return home PT Goal Formulation: With patient Time For Goal Achievement: 10/16/19 Potential to Achieve Goals: Fair    Frequency Min 3X/week   Barriers to discharge        Co-evaluation               AM-PAC PT "6 Clicks" Mobility  Outcome Measure Help needed turning from your back to your side while in a flat bed without using bedrails?: None Help needed moving from lying on your back to sitting on the side of a flat bed without using bedrails?: A Little Help needed moving to and from a bed to a chair (including a wheelchair)?: A Lot Help needed standing up from a chair using your arms (e.g., wheelchair or bedside chair)?: A Lot Help needed to walk in hospital room?: A Lot Help needed climbing 3-5 steps with a railing? : Total 6 Click Score: 14    End of Session Equipment Utilized During Treatment: Oxygen Activity Tolerance:  Other (comment) (limited by anxiety) Patient left: in bed;with call bell/phone within reach;with bed alarm set;with family/visitor present Nurse Communication: Mobility status PT Visit Diagnosis: Muscle weakness (generalized) (M62.81);Difficulty in walking, not elsewhere classified (R26.2);Other (comment) (cardiopulmonary function)    Time: 6812-7517 PT Time Calculation (min) (ACUTE ONLY): 27 min   Charges:   PT Evaluation $PT Eval Moderate Complexity: 1 Mod        Zenaida Niece, PT, DPT Acute Rehabilitation Pager: [phone]   Zenaida Niece 10/02/2019, 5:01 PM

## 2019-10-02 NOTE — Progress Notes (Signed)
Patient refused the use of BIPAP for the evening.  

## 2019-10-02 NOTE — Progress Notes (Signed)
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 Melvyn Novas, anxiety disorder presenting from rehab with severe SOB on BiPAP- requiring tx to ICU for possible intubation and precedex for severe anxiety.  Patient stated she would never want intubation/ MV; made DNI/ DNR.    Past Medical History  COPD/asthma overlap Anxiety disorder Former smoker Bipolar Class 3 obesity Chronic pain GERD  Significant Hospital Events   8/18-8/24: COPD flare admission 8/30-9/1: COPD flare admission and back pain, sent to rehab 9/1-9/19: IP rehab admission 9/19>> back to [facility] for breathing issues-> to ICU for worsening resp distress/ anxiety, on precedex.  Patient made clear she would not want intubation/ MV; DNR/ DNI  Consults:  PCCM  Procedures:    Significant Diagnostic Tests:    Micro Data:  COVD neg 8/30  Antimicrobials:    Interim history/subjective:  No acute events overnight  Did not require BIPAP overnight  States she feels well today but is very anxious about ambulation due to fever of dyspnea  Fully understand disease progression and agreeable to palliative.hospice care  Objective   Blood pressure 127/75, pulse 83, temperature 98.3 F (36.8 C), temperature source Oral, resp. rate 19, height 5\' 3"  (1.6 m), weight 92.9 kg, SpO2 91 %.    FiO2 (%):  [30 %] 30 %   Intake/Output Summary (Last 24 hours) at 10/02/2019 0756 Last data filed at 10/02/2019 0700 Gross per 24 hour  Intake 1374.63 ml  Output 450 ml  Net 924.63 ml   Filed Weights   09/28/19 1100 09/30/19 0317  Weight: 90.5 kg 92.9 kg   Examination: General: Very pleasant chronically ill appearing elderly female lying in bed in NAD HEENT: Julia Daniels/AT, MM pink/moist, PERRL,  Neuro: Alert and  oriented x2, non-focal  CV: s1s2 regular rate and rhythm, no murmur, rubs, or gallops,  PULM:  Slight bilateral rhonchi that clears with cough, oxygen saturations mid 90's on 2-3L [place] GI: soft, bowel sounds active in all 4 quadrants, non-tender, non-distended Extremities: warm/dry, no edema  Skin: no rashes or lesions  Resolved Hospital Problem list     Assessment & Plan:  Acute exacerbation of COPD > has COPD asthma overlap syndrome Severe airways disease at baseline  deconditioning Acute exacerbation and acute respiratory distress 09/30/2019 -On arrival to ICU patient needed intubation but after discussion with patient she does not want to be intubated see below for actions taken P: Can continue to utilize BIPAP only for severe distress/comfort  Continue supplemental oxygen  Precedex weaned off 9/23 Continue decreased solumedrol  Continue scheduled and PRN bronchodilators  Aggressive pulmonary hygiene  PRN Morphine and Ativan  Palliative following Remains DNR/DNI  Anxiety, bipolar P: Precedex weaned off 9/23 Continue home meds; Risperdal, Seroquel, Lamictal, Concerta, and Effexor  PRN Ativan   Back pain P: PRN tylenol and Norco  Continue Baclofen   Constipation P: Change Colace to BID  PRN Miralax  PRN suppository   Best practice:  Diet: NPO except meds Pain/Anxiety/Delirium protocol (if indicated): weaning precedex VAP protocol (if indicated): n/a DVT prophylaxis: lovenox GI prophylaxis: pepcid Glucose control: trend on BMET Mobility: PT ordered Code Status: DNI/ DNR Family Communication: Patient updated, will update family on arrival Disposition: If patient remains off Precedex will transfer to  palliative floor and to [facility] effective 9/24  Labs   CBC: Recent Labs  Lab 09/28/19 1158 09/28/19 1256 09/29/19 0641  WBC  --  11.3* 8.9  NEUTROABS  --  10.6*  --   HGB 11.9* 11.0* 11.8*  HCT 35.0* 36.2 38.1  MCV  --  103.4* 103.8*  PLT  --  307 315     Basic Metabolic Panel: Recent Labs  Lab 09/26/19 0612 09/28/19 1158 09/29/19 0641 10/01/19 0939  NA  --  139 139 139  K  --  4.1 3.9 4.3  CL  --   --  96* 99  CO2  --   --  33* 29  GLUCOSE  --   --  94 134*  BUN  --   --  10 27*  CREATININE 0.68  --  0.67 0.70  CALCIUM  --   --  10.4* 10.1  MG  --   --  2.2 2.2  PHOS  --   --  4.1 3.8   GFR: Estimated Creatinine Clearance: 74.9 mL/min (by C-G formula based on SCr of 0.7 mg/dL). Recent Labs  Lab 09/28/19 1256 09/29/19 0641  WBC 11.3* 8.9    Liver Function Tests: No results for input(s): AST, ALT, ALKPHOS, BILITOT, PROT, ALBUMIN in the last 168 hours. No results for input(s): LIPASE, AMYLASE in the last 168 hours. No results for input(s): AMMONIA in the last 168 hours.  ABG    Component Value Date/Time   PHART 7.250 (L) 09/30/2019 0829   PCO2ART 81.8 (HH) 09/30/2019 0829   PO2ART 84.9 09/30/2019 0829   HCO3 34.7 (H) 09/30/2019 0829   TCO2 42 (H) 09/28/2019 1158   ACIDBASEDEF 0.6 12/31/2017 1642   O2SAT 94.3 09/30/2019 0829     Coagulation Profile: No results for input(s): INR, PROTIME in the last 168 hours.  Cardiac Enzymes: No results for input(s): CKTOTAL, CKMB, CKMBINDEX, TROPONINI in the last 168 hours.  HbA1C: Hgb A1c MFr Bld  Date/Time Value Ref Range Status  08/28/2019 05:06 AM 5.7 (H) 4.8 - 5.6 % Final    Comment:    (NOTE) Pre diabetes:          5.7%-6.4%  Diabetes:              >6.4%  Glycemic control for   <7.0% adults with diabetes   12/31/2017 01:17 PM 5.1 4.8 - 5.6 % Final    Comment:    (NOTE) Pre diabetes:          5.7%-6.4% Diabetes:              >6.4% Glycemic control for   <7.0% adults with diabetes     CBG: Recent Labs  Lab 09/28/19 1142 09/28/19 1950  GLUCAP 120* 134*     Signature:   Johnsie Cancel, NP-C [facility] Contact / Pager information can be found on Amion  10/02/2019, 8:06 AM

## 2019-10-03 DIAGNOSIS — Z7189 Other specified counseling: Secondary | ICD-10-CM

## 2019-10-03 DIAGNOSIS — J9621 Acute and chronic respiratory failure with hypoxia: Secondary | ICD-10-CM

## 2019-10-03 DIAGNOSIS — Z66 Do not resuscitate: Secondary | ICD-10-CM

## 2019-10-03 DIAGNOSIS — J9622 Acute and chronic respiratory failure with hypercapnia: Secondary | ICD-10-CM

## 2019-10-03 DIAGNOSIS — Z515 Encounter for palliative care: Secondary | ICD-10-CM

## 2019-10-03 MED ORDER — LOPERAMIDE HCL 2 MG PO CAPS
2.0000 mg | ORAL_CAPSULE | Freq: Four times a day (QID) | ORAL | Status: DC | PRN
  Administered 2019-10-03: 2 mg via ORAL
  Filled 2019-10-03: qty 1

## 2019-10-03 MED ORDER — MORPHINE SULFATE (CONCENTRATE) 10 MG/0.5ML PO SOLN
2.5000 mg | ORAL | Status: DC | PRN
  Administered 2019-10-04: 2.6 mg via ORAL
  Filled 2019-10-03: qty 0.5

## 2019-10-03 MED ORDER — METHYLPREDNISOLONE SODIUM SUCC 40 MG IJ SOLR
40.0000 mg | Freq: Three times a day (TID) | INTRAMUSCULAR | Status: DC
  Administered 2019-10-03 – 2019-10-04 (×2): 40 mg via INTRAVENOUS
  Filled 2019-10-03 (×2): qty 1

## 2019-10-03 MED ORDER — BISMUTH SUBSALICYLATE 262 MG PO CHEW
524.0000 mg | CHEWABLE_TABLET | Freq: Four times a day (QID) | ORAL | Status: DC | PRN
  Administered 2019-10-06: 524 mg via ORAL
  Filled 2019-10-03 (×2): qty 2

## 2019-10-03 NOTE — Progress Notes (Signed)
RE: Julia Daniels  Date of Birth: 06-03-1950  Date: 10/03/2019  To Whom It May Concern:  Please be advised that the above-named patient will require a short-term nursing home stay - anticipated 30 days or less for rehabilitation and strengthening. The plan is for return home.

## 2019-10-03 NOTE — Progress Notes (Signed)
Patient ID: NORINE REDDINGTON, female   DOB: [birth year], 69 y.o.   MRN: [id number]  PROGRESS NOTE    CHASITIE PASSEY  IFO:[id number] DOB: January 26, 1950 DOA: 09/28/2019 PCP: Simona Huh, NP   Brief Narrative:  69 year old female with history of COPD/asthma on intermittent oxygen at home, anxiety disorder/bipolar disorder, GERD, recent recurrent admissions for COPD exacerbation/respiratory failure was admitted from inpatient rehab with severe shortness of breath on 09/28/2019.  During the hospitalization, she was transferred to ICU because of worsening hypoxia and required BiPAP.  She did not wish to be intubated and she was changed to DNR status.  She also had severe anxiety for which she had to be placed on Precedex drip which was subsequently discontinued.  She has been transferred back to [facility] service on 10/03/2019.  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  She has been made a DNR and intermittently tolerated BiPAP in the ICU -Refused to wear BiPAP last evening.  Currently on 2 L oxygen via nasal cannula -Decrease Solu-Medrol to 40 mg IV every 8 hours.  Continue nebs. -Patient has had multiple admissions for COPD exacerbation recently and worsening anxiety has played a big factor in the same. -Palliative care consultation for goals of care discussion: Consider hospice. Her overall prognosis seems poor with recurrent hospitalizations -Continue montelukast  Anxiety/bipolar disorder Chronic pain -Off Precedex drip.  Continue risperidone, quetiapine, lamotrigine, methylphenidate, baclofen, venlafaxine and as needed Ativan  Generalized deconditioning -PT recommends SNF placement.  Social worker consult. -Palliative care consultation -Very poor prognosis: Recommend hospice   DVT prophylaxis: Lovenox Code  Status: DNR Family Communication: None at bedside Disposition Plan: Status is: Inpatient  Remains inpatient appropriate because:Inpatient level of care appropriate due to severity of illness   Dispo: The patient is from: CIR              Anticipated d/c is to: SNF              Anticipated d/c date is: 2 days              Patient currently is not medically stable to d/c.   Consultants: Palliative care/PCCM  Procedures: None  Antimicrobials:  Anti-infectives (From admission, onward)   None       Subjective: Patient seen and examined at bedside.  Poor historian.  Still feels short of breath but feels slightly better.  As per nursing staff, patient refused to wear BiPAP last night.  No overnight fever, vomiting reported.  Objective: Vitals:   10/02/19 2036 10/02/19 2114 10/02/19 2115 10/03/19 0021  BP: (!) 173/91   (!) 152/90  Pulse: 100 92  86  Resp: 20 16  15   Temp: 98.5 F (36.9 C)   98.5 F (36.9 C)  TempSrc: Oral   Oral  SpO2: 93% 93% 96% 90%  Weight:      Height:        Intake/Output Summary (Last 24 hours) at 10/03/2019 0813 Last data filed at 10/02/2019 2300 Gross per 24 hour  Intake 480 ml  Output 600 ml  Net -120 ml   Filed Weights   09/28/19 1100 09/30/19 0317  Weight: 90.5 kg 92.9 kg    Examination:  General exam: Appears calm and comfortable.  Chronically ill looking.  Looks older than stated age.  Currently on 2 L oxygen via nasal cannula. Respiratory system:  Bilateral decreased breath sounds at bases with scattered crackles Cardiovascular system: S1 & S2 heard, Rate controlled Gastrointestinal system: Abdomen is obese, nondistended, soft and nontender. Normal bowel sounds heard. Extremities: No cyanosis, clubbing; trace lower extremity edema present Central nervous system: Awake, very poor historian.  Hardly participates in any conversation.  No focal neurological deficits. Moving extremities Skin: No rashes, lesions or ulcers Psychiatry:  Anxious looking  Data Reviewed: I have personally reviewed following labs and imaging studies  CBC: Recent Labs  Lab 09/28/19 1158 09/28/19 1256 09/29/19 0641  WBC  --  11.3* 8.9  NEUTROABS  --  10.6*  --   HGB 11.9* 11.0* 11.8*  HCT 35.0* 36.2 38.1  MCV  --  103.4* 103.8*  PLT  --  307 295   Basic Metabolic Panel: Recent Labs  Lab 09/28/19 1158 09/29/19 0641 10/01/19 0939  NA 139 139 139  K 4.1 3.9 4.3  CL  --  96* 99  CO2  --  33* 29  GLUCOSE  --  94 134*  BUN  --  10 27*  CREATININE  --  0.67 0.70  CALCIUM  --  10.4* 10.1  MG  --  2.2 2.2  PHOS  --  4.1 3.8   GFR: Estimated Creatinine Clearance: 74.9 mL/min (by C-G formula based on SCr of 0.7 mg/dL). Liver Function Tests: No results for input(s): AST, ALT, ALKPHOS, BILITOT, PROT, ALBUMIN in the last 168 hours. No results for input(s): LIPASE, AMYLASE in the last 168 hours. No results for input(s): AMMONIA in the last 168 hours. Coagulation Profile: No results for input(s): INR, PROTIME in the last 168 hours. Cardiac Enzymes: No results for input(s): CKTOTAL, CKMB, CKMBINDEX, TROPONINI in the last 168 hours. BNP (last 3 results) No results for input(s): PROBNP in the last 8760 hours. HbA1C: No results for input(s): HGBA1C in the last 72 hours. CBG: Recent Labs  Lab 09/28/19 1142 09/28/19 1950  GLUCAP 120* 134*   Lipid Profile: No results for input(s): CHOL, HDL, LDLCALC, TRIG, CHOLHDL, LDLDIRECT in the last 72 hours. Thyroid Function Tests: No results for input(s): TSH, T4TOTAL, FREET4, T3FREE, THYROIDAB in the last 72 hours. Anemia Panel: No results for input(s): VITAMINB12, FOLATE, FERRITIN, TIBC, IRON, RETICCTPCT in the last 72 hours. Sepsis Labs: No results for input(s): PROCALCITON, LATICACIDVEN in the last 168 hours.  No results found for this or any previous visit (from the past 240 hour(s)).       Radiology Studies: No results found.      Scheduled Meds: . ALPRAZolam  0.25 mg  Oral TID  . arformoterol  15 mcg Nebulization BID  . atorvastatin  40 mg Oral Daily  . baclofen  10 mg Oral TID  . budesonide (PULMICORT) nebulizer solution  0.5 mg Nebulization BID  . chlorhexidine  15 mL Mouth Rinse BID  . Chlorhexidine Gluconate Cloth  6 each Topical Daily  . docusate sodium  100 mg Oral BID  . enoxaparin (LOVENOX) injection  40 mg Subcutaneous Q24H  . famotidine  20 mg Oral QHS  . guaiFENesin  600 mg Oral BID  . lamoTRIgine  200 mg Oral QHS  . lidocaine  1 patch Transdermal Q24H  . loratadine  10 mg Oral QPM  . mouth rinse  15 mL Mouth Rinse q12n4p  . methylphenidate  72 mg Oral BH-q7a  . methylPREDNISolone (SOLU-MEDROL) injection  40 mg Intravenous Q6H  . montelukast  10 mg Oral QHS  .  morphine injection  2 mg  Intravenous Once  . pantoprazole  40 mg Oral Daily  . QUEtiapine  150 mg Oral QHS  . revefenacin  175 mcg Nebulization Daily  . risperiDONE  3 mg Oral QHS  . senna-docusate  1 tablet Oral BID  . venlafaxine XR  150 mg Oral QHS   Continuous Infusions:        Aline August, MD [facility] 10/03/2019, 8:13 AM

## 2019-10-03 NOTE — Consult Note (Signed)
Palliative Medicine Inpatient Consult Note  Reason for consult:  Goals of Care "End Stage COPD"  HPI:  Per intake H&P --> 69 year old female with history of COPD/asthma on intermittent oxygen at home, anxiety disorder/bipolar disorder, GERD, recent recurrent admissions for COPD exacerbation/respiratory failure was admitted from inpatient rehab with severe shortness of breath on 09/28/2019.  During the hospitalization, she was transferred to ICU because of worsening hypoxia and required BiPAP.  She did not wish to be intubated and she was changed to DNR status.  She also had severe anxiety for which she had to be placed on Precedex drip which was subsequently discontinued.  She has been transferred back to [facility] service on 10/03/2019.  Palliative care was asked to get involved to further discuss goals of care in the setting of progressive COPD.  Clinical Assessment/Goals of Care: I have reviewed medical records including EPIC notes, labs and imaging, received report from bedside RN, assessed the patient who was awake and alert.    I met with Julia Daniels to further discuss diagnosis prognosis, GOC, EOL wishes, disposition and options.   I introduced Palliative Medicine as specialized medical care for people living with serious illness. It focuses on providing relief from the symptoms and stress of a serious illness. The goal is to improve quality of life for both the patient and the family.  I asked Julia Daniels or "Julia Daniels" as she goes by to tell me about herself. She shares that she is from [place] near [employer]. She now lives in [place]. She is not married though has two sons Julia Daniels and Julia Daniels. She has two grandchildren who she states "are the light of my life". Her mother lives in [place] and sister lives in [place]. She has a brother who lives in [place] though he will be visiting - they are close. She has been working at [employer] - she works the third shift  though is now on FMLA due to her health condition. She loves her place of work. She is a faithful woman and practices Christianity.  In terms of Julia Daniels's home situation she lives with her son Julia Daniels. Same does not care for [facility] as she had been mostly independent until July of this year when her dyspnea started worsening. She was able to mobilize and aid herself in all bADLs until then.  I asked Julia Daniels who cares for her COPD - she shares that she has a close relationship with her doctor - Julia Daniels who is her pulmonologist. She states that per her last meeting with him she had stage III COPD she goes on to shares, "I was doing so good." She then describes how activities started becoming more difficult due to her shortness of breath and that it became more difficult to perform simple tasks. She expresses that she has severe anxiety with mobility now as she fears she will not be able to breath.  We reviewed COPD and the stages and progression of illness - Julia Daniels realizes that her disease is very serious as she never required full time O2 and now she does. We reviewed that if she continues to worsen enrollment in hospice would be a reasonable consideration.  I described hospice as a service for patients for have a life expectancy of < 76month. It preserves dignity and quality at the end phases of life. The focus changes from curative to symptom relief.   A detailed discussion was had today regarding advanced directives - she plans to complete those  while here.  She would rely on her son, Julia Daniels to make decisions for her if she were unable to.   Concepts specific to code status, artifical feeding and hydration, continued IV antibiotics and rehospitalization was had.  I completed a MOST form today. The patient and family outlined their wishes for the following treatment decisions:  Cardiopulmonary Resuscitation: Do Not Attempt Resuscitation (DNR/No CPR)  Medical Interventions: Limited Additional Interventions: Use medical  treatment, IV fluids and cardiac monitoring as indicated, DO NOT USE intubation or mechanical ventilation. May consider use of less invasive airway support such as BiPAP or CPAP. Also provide comfort measures. Transfer to the hospital if indicated. Avoid intensive care.   Antibiotics: Antibiotics if indicated  IV Fluids: IV fluids if indicated  Feeding Tube: Feeding tube for a defined trial period   The difference between a aggressive medical intervention path  and a palliative comfort care path for this patient at this time was had. Patient at this time would like to focus on short terms goals - she would like to pursue a trial at SNF to get stronger. She is open to having OP Palliative care following along and realizes that at some point she will need to involve the help of hospice.  Discussed the importance of continued conversation with family and their  medical providers regarding overall plan of care and treatment options, ensuring decisions are within the context of the patients values and GOCs.  Adked Julia Daniels to weigh in via secure chat though he had not seen the patient in sometime therefore relies on the insights of the IP CCM team. I spoke to Julia Laughter NP and discussed the plan as below which she agrees is very reasonable based upon their past conversation.   Decision Maker: Patient can make decisions for herself.  SUMMARY OF RECOMMENDATIONS   DNAR/DNI  MOST Completed, paper copy placed onto the chart electric copy can be found in [facility]  DNR Form Completed, paper copy placed onto the chart electric copy can be found in [facility]  Goals: Patient would like to pursue a trial at SNF to see if she can get stronger - she is aware the her COPD is severe but is not quite ready for hospice yet  TOC - OP Palliative referral  Chaplain - Advance directives and spiritual support  PMT will continue to check in   Code Status/Advance Care Planning: DNAR/DNI   Symptom Management:   Anxiety: - On alprazolam TID  Dyspnea: - Morphine 2.28m PO Q2H PRN  Generalized Weakness: -PT/OT   Palliative Prophylaxis:   Oral Care, Mobility, Pain, Constipation  Additional Recommendations (Limitations, Scope, Preferences):  Treat what is treatable   Psycho-social/Spiritual:   Desire for further Chaplaincy support: Yes  Additional Recommendations: Education on OP Palliative care   Prognosis: Poor in the setting of severe COPD  Discharge Planning:   PPS: 30%   This conversation/these recommendations were discussed with patient primary care team, Dr. AStarla Daniels Time In: 0645 Time Out: 0800 Total Time: 75 Greater than 50%  of this time was spent counseling and coordinating care related to the above assessment and plan.  [facility] Team Cell Phone: [phone] utilize secure chat with additional questions, if there is no response within 30 minutes please call the above phone number  Palliative Medicine Team providers are available by phone from 7am to 7pm daily and can be reached through the team cell phone.  Should this patient require assistance  outside of these hours, please call the patient's attending physician.

## 2019-10-03 NOTE — Progress Notes (Signed)
Physical Therapy Treatment Patient Details Name: Julia Daniels MRN: [id number] DOB: May 08, 1950 Today's Date: 10/03/2019    History of Present Illness 69 year old woman with hx of COPD/asthma overlap on fasenra followed by Dr. Melvyn Novas, anxiety disorder presenting from rehab with severe SOB on BiPAP- requiring tx to ICU for possible intubation and precedex for severe anxiety.  Patient stated she would never want intubation/ MV; made DNI/ DNR.    PT Comments    On entry pt supine in bed reluctantly agreeable to therapy. Pt bed pads found to be wet. Started session with Rolling L and R to remove wet pad and for pericare. Pt able to perform rolling without increase in O2 demand and/or anxiety so agreeable to come to EoB. Pt requires increased encouragement but only min physical A for sitting EoB. Pt with increased oxygen demand and unable to recover with purse lip breathing so increased supplemental O2 (see general comments) Pt able to assist in donning back brace and agreeable to standing and stepping along EoB.  Pt requires supervision for transfers and min guard for stepping along EoB. Pt able to complete without increase in anxiety and very happy with progress. Pt requires modA for returning to supine. PT will continue to work with pt to decreased anxiety around mobility and increased O2 demand required for mobility. D/c plans remain appropriate.     Follow Up Recommendations  SNF (may progress to home but with limited caregiver support)     Equipment Recommendations  Rolling walker with 5" wheels       Precautions / Restrictions Precautions Precautions: Fall;Back Precaution Comments: T6 compression fx, COPD, anxiety Required Braces or Orthoses: Spinal Brace Spinal Brace: Thoracolumbosacral orthotic;Applied in sitting position Restrictions Other Position/Activity Restrictions: Don/doff EOB for self teaching    Mobility  Bed Mobility Overal bed mobility: Needs Assistance Bed  Mobility: Rolling Rolling: Supervision Sidelying to sit: Min assist     Sit to sidelying: Mod assist General bed mobility comments: supervision for rolling onto side, minA for elevating trunk to upright, modA for returning LE to bed, despite cuing pt rolled onto back before LE back in bed causing increased pain   Transfers Overall transfer level: Needs assistance Equipment used: Rolling walker (2 wheeled)   Sit to Stand: Supervision         General transfer comment: supervision for safety, with first attempt hips too far back in bed, good power up and self steadying on second attempt  Ambulation/Gait Ambulation/Gait assistance: Min guard Gait Distance (Feet): 3 Feet Assistive device: Rolling walker (2 wheeled) Gait Pattern/deviations: Decreased stride length;Step-to pattern Gait velocity: decreased   General Gait Details: min guard for safety with side stepping along EoB to HoB,        Balance Overall balance assessment: Needs assistance Sitting-balance support: Feet supported Sitting balance-Leahy Scale: Fair     Standing balance support: Single extremity supported Standing balance-Leahy Scale: Poor Standing balance comment: requires at least single UE support                             Cognition Arousal/Alertness: Awake/alert Behavior During Therapy: Anxious Overall Cognitive Status: Within Functional Limits for tasks assessed                                           General Comments General comments (skin  integrity, edema, etc.): pt on 2L O2 via [place] in supine with SaO2 94%O2 with coming to sit EoB SaO2 dropped to 84%O2, pt able to recover to 88%O2 with purse lip breathing however unable to progress further, increased to 3L O2 and pt rebounded to 93%O2, with stepping along bed SaO2 dropped to 87%O2 with sitting and purse lip breathing able to recover to 91%O2, with return to supine able to wean back to 2L O2 and maintain SaO2 91%O2       Pertinent Vitals/Pain Pain Assessment: Faces Faces Pain Scale: Hurts even more Pain Location: back with movement  Pain Descriptors / Indicators: Sore Pain Intervention(s): Monitored during session;Repositioned;RN gave pain meds during session           PT Goals (current goals can now be found in the care plan section) Acute Rehab PT Goals Patient Stated Goal: to improve mobility and return home PT Goal Formulation: With patient Time For Goal Achievement: 10/16/19 Potential to Achieve Goals: Fair Progress towards PT goals: Progressing toward goals    Frequency    Min 3X/week      PT Plan Current plan remains appropriate       AM-PAC PT "6 Clicks" Mobility   Outcome Measure  Help needed turning from your back to your side while in a flat bed without using bedrails?: None Help needed moving from lying on your back to sitting on the side of a flat bed without using bedrails?: A Little Help needed moving to and from a bed to a chair (including a wheelchair)?: A Lot Help needed standing up from a chair using your arms (e.g., wheelchair or bedside chair)?: A Lot Help needed to walk in hospital room?: A Lot Help needed climbing 3-5 steps with a railing? : Total 6 Click Score: 14    End of Session Equipment Utilized During Treatment: Oxygen;Back brace Activity Tolerance: Other (comment) (limited by anxiety) Patient left: in bed;with call bell/phone within reach;with bed alarm set Nurse Communication: Mobility status PT Visit Diagnosis: Muscle weakness (generalized) (M62.81);Difficulty in walking, not elsewhere classified (R26.2);Other (comment) (cardiopulmonary function)     Time: 1660-6301 PT Time Calculation (min) (ACUTE ONLY): 33 min  Charges:  $Therapeutic Activity: 23-37 mins                     Mansi B. Migdalia Dk PT, DPT Acute Rehabilitation Services Pager [phone] Office [phone]    [place] 10/03/2019, 5:24 PM

## 2019-10-03 NOTE — TOC Initial Note (Addendum)
Transition of Care [facility]) - Initial/Assessment Note    Patient Details  Name: Julia Daniels MRN: [id number] Date of Birth: 1950-03-13  Transition of Care [facility]) CM/SW Contact:    Trula Ore, [place] Phone Number: 10/03/2019, 1:53 PM  Clinical Narrative:                  CSW received consult for possible SNF placement at time of discharge. CSW spoke with patient regarding PT recommendation of SNF placement at time of discharge. Patient reported that patient's son is currently unable to care for patient at their home due to work schedule and given patient's current physical needs and fall risk. Patient expressed understanding of PT recommendation and is agreeable to SNF placement at time of discharge. Patient would like CSW to fax out initial referral to [facility], [place], and [place] area. Patient has  received the COVID vaccines.CSW made referral to [place] and spoke with Anderson Malta for outpatient palliative services to follow patient [facility].No further questions reported at this time. CSW to continue to follow and assist with discharge planning needs.  Expected Discharge Plan: Skilled Nursing Facility Barriers to Discharge: Continued Medical Work up   Patient Goals and CMS Choice Patient states their goals for this hospitalization and ongoing recovery are:: to go to SNF CMS Medicare.gov Compare Post Acute Care list provided to:: Patient Choice offered to / list presented to : Patient  Expected Discharge Plan and Services Expected Discharge Plan: [place] arrangements for the past 2 months: Single Family Home                                      Prior Living Arrangements/Services Living arrangements for the past 2 months: Single Family Home Lives with:: Self, Adult Children ([name] son) Patient language and need for interpreter reviewed:: Yes Do you feel safe going back to the place where you live?: No   SNF  Need for Family  Participation in Patient Care: Yes (Comment) Care giver support system in place?: Yes (comment)   Criminal Activity/Legal Involvement Pertinent to Current Situation/Hospitalization: No - Comment as needed  Activities of Daily Living      Permission Sought/Granted Permission sought to share information with : Case Manager, Family Supports, Customer service manager    Share Information with NAME: [name]  Permission granted to share info w AGENCY: SNF  Permission granted to share info w Relationship: Son  Permission granted to share info w Contact Information: Inocente Salles [phone]  Emotional Assessment Appearance:: Appears stated age Attitude/Demeanor/Rapport: Gracious Affect (typically observed): Calm Orientation: : Oriented to Self, Oriented to Place, Oriented to  Time, Oriented to Situation Alcohol / Substance Use: Not Applicable Psych Involvement: No (comment)  Admission diagnosis:  Acute respiratory failure with hypoxia ([place]) [J96.01] Patient Active Problem List   Diagnosis Date Noted  . Palliative care by specialist   . Goals of care, counseling/discussion   . DNR (do not resuscitate)   . Acute respiratory failure with hypercapnia ([place]) 09/29/2019  . Drug induced constipation   . Leukocytosis   . Supplemental oxygen dependent   . Anxiety state   . Debility 09/13/2019  . Back pain 09/08/2019  . Essential hypertension 07/27/2019  . Allergic rhinitis 03/14/2018  . Acute respiratory failure with hypoxia ([place]) 03/08/2018  . Elevated MCV 01/31/2018  . Vitamin B12 deficiency 01/31/2018  . Respiratory failure,  acute-on-chronic ([place]) 12/31/2017  . COPD exacerbation ([place]) 09/18/2017  . Hypoxia   . Hyperlipidemia 08/23/2017  . Depression 08/23/2017  . Anxiety 08/23/2017  . Hypercalcemia 08/23/2017  . Thrombocytosis ([place]) 08/23/2017  . COPD with acute exacerbation ([place]) 08/23/2017  . ADD (attention deficit disorder) 08/30/2012  . Bipolar disorder, unspecified ([place])  08/30/2012  . COPD  GOLD ? III with increased Eos and reversibility ? ACOS ?  08/30/2012  . Tobacco abuse 08/30/2012   PCP:  Simona Huh, NP Pharmacy:   [id number] [place] [place] [address] [place] [address] Phone: [phone] Fax: [phone]  [place], [place] [address] [place] [address] Phone: [phone] Fax: [phone]  [id number] [place], [address] AT [address] [place] [address] Phone: [phone] Fax: [phone]  [facility] [place], [place] [address] Phone: [phone] Fax: [phone]  [facility] [place], [place] [address] Phone: [phone] Fax: [phone]  [facility] [place], [place] [address] [place] [address] Phone: [phone] Fax: [phone]     Social Determinants of Health (SDOH) Interventions    Readmission Risk Interventions No flowsheet data found.

## 2019-10-03 NOTE — Progress Notes (Signed)
PT Cancellation Note  Patient Details Name: Julia Daniels MRN: [id number] DOB: 1950/11/09   Cancelled Treatment:    Reason Eval/Treat Not Completed: (P) Patient declined, no reason specified Pt reports that brother from [place] is on his way and request PT follow back later this afternoon. PT will follow back as able.   Lanetra B. Migdalia Dk PT, DPT Acute Rehabilitation Services Pager [phone] Office [phone]    [place] 10/03/2019, 11:56 AM

## 2019-10-03 NOTE — Progress Notes (Addendum)
   10/03/19 1512  Clinical Encounter Type  Visited With Patient and family together  Visit Type Initial;Spiritual support;Other (Comment) (Advance Directive)  Referral From Palliative care team  Consult/Referral To Chaplain  This chaplain responded to PMT consult for spiritual care, including completing Pt. Advance Directive.  The Pt. is sitting up in bed and conversational.  The Pt. brother is bedside. The chaplain was present after AD education with the notary and witnesses to notarize the Pt. HCPOA and Living Will.  The Pt. named Charnee Turnipseed as her Healthcare Agent [phone]) and Palma Holter III [phone]) if the Healthcare Agent is unable or unwilling to serve. This chaplain is available for F/U spiritual care as needed.  *The Pt. original Advance Directive and copies were given to the Pt. A copy of the AD was scanned to Electronic Medical Records.

## 2019-10-03 NOTE — NC FL2 (Signed)
[place] LEVEL OF CARE SCREENING TOOL     IDENTIFICATION  Patient Name: Julia Daniels Birthdate: 1950/03/10 Sex: female Admission Date (Current Location): 09/28/2019  [facility] and [place] Number:  Herbalist and Address:  [facility] [place], [place] [address], [place], [place] [address]      Provider Number: [id number]  Attending Physician Name and Address:  Aline August, MD  Relative Name and Phone Number:  Inocente Salles [phone]    Current Level of Care: Hospital Recommended Level of Care: [place] Prior Approval Number:    Date Approved/Denied:   PASRR Number: PASRR under review  Discharge Plan: SNF    Current Diagnoses: Patient Active Problem List   Diagnosis Date Noted  . Palliative care by specialist   . Goals of care, counseling/discussion   . DNR (do not resuscitate)   . Acute respiratory failure with hypercapnia ([place]) 09/29/2019  . Drug induced constipation   . Leukocytosis   . Supplemental oxygen dependent   . Anxiety state   . Debility 09/13/2019  . Back pain 09/08/2019  . Essential hypertension 07/27/2019  . Allergic rhinitis 03/14/2018  . Acute respiratory failure with hypoxia ([place]) 03/08/2018  . Elevated MCV 01/31/2018  . Vitamin B12 deficiency 01/31/2018  . Respiratory failure, acute-on-chronic ([place]) 12/31/2017  . COPD exacerbation ([place]) 09/18/2017  . Hypoxia   . Hyperlipidemia 08/23/2017  . Depression 08/23/2017  . Anxiety 08/23/2017  . Hypercalcemia 08/23/2017  . Thrombocytosis ([place]) 08/23/2017  . COPD with acute exacerbation ([place]) 08/23/2017  . ADD (attention deficit disorder) 08/30/2012  . Bipolar disorder, unspecified ([place]) 08/30/2012  . COPD  GOLD ? III with increased Eos and reversibility ? ACOS ?  08/30/2012  . Tobacco abuse 08/30/2012    Orientation RESPIRATION BLADDER Height & Weight     Self, Time, Situation, Place  O2 (Nasal Cannula 2 liters) Continent, External  catheter (External Urinary Catheter) Weight: 204 lb 12.9 oz (92.9 kg) Height:  5\' 3"  (160 cm)  BEHAVIORAL SYMPTOMS/MOOD NEUROLOGICAL BOWEL NUTRITION STATUS      Continent Diet (See [place] Summary)  AMBULATORY STATUS COMMUNICATION OF NEEDS Skin   Limited Assist Verbally Skin abrasions, Other (Comment) (Ecchymosis arm;hand;right;left, Abrasion Arm Right foam cleansed)                       Personal Care Assistance Level of Assistance  Bathing, Feeding, Dressing Bathing Assistance: Maximum assistance Feeding assistance: Independent (able to feed self) Dressing Assistance: Maximum assistance     Functional Limitations Info  Sight, Hearing, Speech Sight Info: Adequate Hearing Info: Adequate Speech Info: Adequate    SPECIAL CARE FACTORS FREQUENCY  PT (By licensed PT), OT (By licensed OT)     PT Frequency: 5x min weekly OT Frequency: 5x min weekly            Contractures Contractures Info: Not present    Additional Factors Info  Code Status, Allergies, Psychotropic Code Status Info: DNR Allergies Info: Sulfa Antibiotics Psychotropic Info: ALPRAZolam (XANAX) tablet 0.25 mg 3 times daily,QUEtiapine (SEROQUEL) tablet 150 mg daily at bedtime,risperiDONE (RISPERDAL) tablet 3 mg  daily at bedtimevenlafaxine XR (EFFEXOR-XR) 24 hr capsule 150 mg,daily at bedtime         Current Medications (10/03/2019):  This is the current hospital active medication list Current Facility-Administered Medications  Medication Dose Route Frequency Provider Last Rate Last Admin  . ALPRAZolam Duanne Moron) tablet 0.25 mg  0.25 mg Oral TID Arnell Asal, NP  0.25 mg at 10/03/19 0837  . arformoterol (BROVANA) nebulizer solution 15 mcg  15 mcg Nebulization BID Minor, Grace Bushy, NP   15 mcg at 10/03/19 0918  . atorvastatin (LIPITOR) tablet 40 mg  40 mg Oral Daily Minor, Grace Bushy, NP   40 mg at 10/03/19 0837  . baclofen (LIORESAL) tablet 10 mg  10 mg Oral TID Minor, Grace Bushy, NP   10 mg at 10/03/19  0837  . bismuth subsalicylate (PEPTO BISMOL) chewable tablet 524 mg  524 mg Oral Q6H PRN Alekh, Kshitiz, MD      . budesonide (PULMICORT) nebulizer solution 0.5 mg  0.5 mg Nebulization BID Jennelle Human B, NP   0.5 mg at 10/03/19 0919  . chlorhexidine (PERIDEX) 0.12 % solution 15 mL  15 mL Mouth Rinse BID Minor, Grace Bushy, NP   15 mL at 10/02/19 0923  . Chlorhexidine Gluconate Cloth 2 % PADS 6 each  6 each Topical Daily Minor, Grace Bushy, NP   6 each at 10/02/19 0900  . docusate sodium (COLACE) capsule 100 mg  100 mg Oral BID Merlene Laughter F, NP   100 mg at 10/03/19 0837  . enoxaparin (LOVENOX) injection 40 mg  40 mg Subcutaneous Q24H Minor, Grace Bushy, NP   40 mg at 10/03/19 1241  . famotidine (PEPCID) tablet 20 mg  20 mg Oral QHS Minor, Grace Bushy, NP   20 mg at 10/02/19 2158  . guaiFENesin (MUCINEX) 12 hr tablet 600 mg  600 mg Oral BID Minor, Grace Bushy, NP   600 mg at 10/03/19 0837  . hydrALAZINE (APRESOLINE) injection 5 mg  5 mg Intravenous Q6H PRN Minor, Grace Bushy, NP   5 mg at 09/29/19 1910  . HYDROcodone-acetaminophen (NORCO/VICODIN) 5-325 MG per tablet 1 tablet  1 tablet Oral Q6H PRN Minor, Grace Bushy, NP   1 tablet at 10/03/19 1021  . lamoTRIgine (LAMICTAL) tablet 200 mg  200 mg Oral QHS Minor, Grace Bushy, NP   200 mg at 10/02/19 2158  . levalbuterol (XOPENEX) nebulizer solution 0.63 mg  0.63 mg Nebulization Q2H PRN Minor, Grace Bushy, NP   0.63 mg at 09/30/19 2020  . lidocaine (LIDODERM) 5 % 1 patch  1 patch Transdermal Q24H Minor, Grace Bushy, NP   1 patch at 10/03/19 1241  . loperamide (IMODIUM) capsule 2 mg  2 mg Oral Q6H PRN Aline August, MD   2 mg at 10/03/19 1022  . loratadine (CLARITIN) tablet 10 mg  10 mg Oral QPM Minor, Grace Bushy, NP   10 mg at 10/02/19 1737  . LORazepam (ATIVAN) injection 1 mg  1 mg Intravenous Q4H PRN Minor, Grace Bushy, NP   1 mg at 10/03/19 0313  . MEDLINE mouth rinse  15 mL Mouth Rinse q12n4p Minor, Grace Bushy, NP   15 mL at 10/02/19 1548  . methylphenidate  (CONCERTA) CR tablet 72 mg  72 mg Oral BH-q7a Minor, Grace Bushy, NP   72 mg at 10/03/19 0708  . methylPREDNISolone sodium succinate (SOLU-MEDROL) 40 mg/mL injection 40 mg  40 mg Intravenous Q8H Alekh, Kshitiz, MD   40 mg at 10/03/19 1400  . montelukast (SINGULAIR) tablet 10 mg  10 mg Oral QHS Minor, Grace Bushy, NP   10 mg at 10/02/19 2157  . morphine 2 MG/ML injection 2 mg  2 mg Intravenous Once Minor, Grace Bushy, NP      . morphine 2 MG/ML injection 2-4 mg  2-4 mg Intravenous Q2H PRN Minor, Grace Bushy, NP  4 mg at 10/02/19 2037  . morphine CONCENTRATE 10 MG/0.5ML oral solution 2.6 mg  2.6 mg Oral Q2H PRN Rosezella Rumpf, NP      . ondansetron [facility]) injection 4 mg  4 mg Intravenous Q6H PRN Opyd, Ilene Qua, MD   4 mg at 10/02/19 2156  . pantoprazole (PROTONIX) EC tablet 40 mg  40 mg Oral Daily Minor, Grace Bushy, NP   40 mg at 10/03/19 0837  . polyethylene glycol (MIRALAX / GLYCOLAX) packet 17 g  17 g Oral Daily PRN Minor, Grace Bushy, NP      . QUEtiapine (SEROQUEL) tablet 150 mg  150 mg Oral QHS Minor, Grace Bushy, NP   150 mg at 10/02/19 2158  . revefenacin (YUPELRI) nebulizer solution 175 mcg  175 mcg Nebulization Daily Minor, Grace Bushy, NP   175 mcg at 10/03/19 0920  . risperiDONE (RISPERDAL) tablet 3 mg  3 mg Oral QHS Minor, Grace Bushy, NP   3 mg at 10/02/19 2304  . senna-docusate (Senokot-S) tablet 1 tablet  1 tablet Oral BID Minor, Grace Bushy, NP   1 tablet at 10/02/19 2157  . venlafaxine XR (EFFEXOR-XR) 24 hr capsule 150 mg  150 mg Oral QHS Minor, Grace Bushy, NP   150 mg at 10/02/19 2304     Discharge Medications: Please see discharge summary for a list of discharge medications.  Relevant Imaging Results:  Relevant Lab Results:   Additional Information [phone]  Trula Ore, LCSWA

## 2019-10-04 ENCOUNTER — Encounter (HOSPITAL_COMMUNITY): Payer: BC Managed Care – PPO | Admitting: Occupational Therapy

## 2019-10-04 MED ORDER — METHYLPREDNISOLONE SODIUM SUCC 40 MG IJ SOLR
40.0000 mg | Freq: Two times a day (BID) | INTRAMUSCULAR | Status: DC
  Administered 2019-10-04 – 2019-10-05 (×2): 40 mg via INTRAVENOUS
  Filled 2019-10-04 (×2): qty 1

## 2019-10-04 NOTE — Progress Notes (Signed)
[facility])  Outpatient referral for palliative care services received for Ms. Julia Daniels.  [facility] will follow up with pt at her facility once she d/c's.  Venia Carbon RN, BSN, [facility] Liaison

## 2019-10-04 NOTE — Progress Notes (Signed)
Bipap is PRN order at this time.

## 2019-10-04 NOTE — Progress Notes (Signed)
   Palliative Medicine Inpatient Follow Up Note  Reason for consult:  Goals of Care "End Stage COPD"  HPI:  Per intake H&P --> 69 year old female with history of COPD/asthma on intermittent oxygen at home, anxiety disorder/bipolar disorder, GERD, recent recurrent admissions for COPD exacerbation/respiratory failure was admitted from inpatient rehab with severe shortness of breath on 09/28/2019. During the hospitalization, she was transferred to ICU because of worsening hypoxia and required BiPAP. She did not wish to be intubated and she was changed to DNR status. She also had severe anxiety for which she had to be placed on Precedex drip which was subsequently discontinued. She has been transferred back to [facility] service on 10/03/2019.  Palliative care was asked to get involved to further discuss goals of care in the setting of progressive COPD.  Today's Discussion (10/04/2019): Chart reviewed. I met with Julia Daniels at bedside, she was joined by her son, Julia Daniels. Talked about my conversation with the pulmonology team yesterday - the goal to try to gain strength being reasonable. We talked about how she will need hospice eventually and if she is success at SNF that is wonderful if no then hospice considerations sooner than later would be of benefit. She and her son are bother on board with this plan.  We discussed her anxiety from the day prior. ATC xanax has been helping. She endorses that she was able to stand up and take a few steps yesterday without feeling anxious. She vocalizes trying to center herself and think of the moment at hand. Discusses that she will likely be unable to return to work - asks about how to access social security benefit which I was unable to help with. I will ask SW team to aid in this conversation further.   Discussed the importance of continued conversation with family and their  medical providers regarding overall plan of care and treatment options, ensuring decisions are  within the context of the patients values and GOCs.  Questions and concerns addressed   SUMMARY OF RECOMMENDATIONS DNAR/DNI  MOST Completed, paper copy placed onto the chart electric copy can be found in Vynca  DNR Form Completed, paper copy placed onto the chart electric copy can be found in Vynca  Goals: Patient would like to pursue a trial at SNF to see if she can get stronger - she is aware the her COPD is severe but is not quite ready for hospice yet  TOC - OP Palliative referral  Chaplain - Advance directives and spiritual support  PMT will continue to check in   Symptom Management: Anxiety: - On alprazolam TID  Dyspnea: - Morphine 2.62m PO Q2H PRN  Generalized Weakness: -PT/OT    Time Spent: 25 Greater than 50% of the time was spent in counseling and coordination of care ______________________________________________________________________________________ [facility] Team Cell Phone: [phone] utilize secure chat with additional questions, if there is no response within 30 minutes please call the above phone number  Palliative Medicine Team providers are available by phone from 7am to 7pm daily and can be reached through the team cell phone.  Should this patient require assistance outside of these hours, please call the patient's attending physician.

## 2019-10-04 NOTE — Progress Notes (Signed)
Patient ID: Julia Daniels, female   DOB: [birth year], 69 y.o.   MRN: [id number]  PROGRESS NOTE    Julia Daniels  OFB:[id number] DOB: 1950-05-15 DOA: 09/28/2019 PCP: Simona Huh, NP   Brief Narrative:  69 year old female with history of COPD/asthma on intermittent oxygen at home, anxiety disorder/bipolar disorder, GERD, recent recurrent admissions for COPD exacerbation/respiratory failure was admitted from inpatient rehab with severe shortness of breath on 09/28/2019.  During the hospitalization, she was transferred to ICU because of worsening hypoxia and required BiPAP.  She did not wish to be intubated and she was changed to DNR status.  She also had severe anxiety for which she had to be placed on Precedex drip which was subsequently discontinued.  She has been transferred back to [facility] service on 10/03/2019.  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  She has been made a DNR and intermittently tolerated BiPAP in the ICU - Currently on 2 L oxygen via nasal cannula -Decrease Solu-Medrol to 40 mg IV every 12 hours.  Continue nebs. -Patient has had multiple admissions for COPD exacerbation recently and worsening anxiety has played a big factor in the same. -Palliative care consultation evaluation appreciated: Her overall prognosis seems poor with recurrent hospitalizations.  If condition continues to worsen, consider hospice -Continue montelukast  Anxiety/bipolar disorder Chronic pain -Off Precedex drip.  Continue risperidone, quetiapine, lamotrigine, methylphenidate, baclofen, venlafaxine and as needed Ativan  Generalized deconditioning -PT recommends SNF placement.  Social worker following. -Will need outpatient palliative care follow-up.  Obesity -Outpatient follow-up  DVT prophylaxis:  Lovenox Code Status: DNR Family Communication: None at bedside Disposition Plan: Status is: Inpatient  Remains inpatient appropriate because:Inpatient level of care appropriate due to severity of illness   Dispo: The patient is from: CIR              Anticipated d/c is to: SNF              Anticipated d/c date is: 1 day              Patient currently is not medically stable to d/c.   Consultants: Palliative care/PCCM  Procedures: None  Antimicrobials:  Anti-infectives (From admission, onward)   None       Subjective: Patient seen and examined at bedside.  Poor historian.  Denies worsening shortness of breath.  Still has intermittent cough.  Feels slightly better.  No overnight fever, vomiting, chest pain reported.  Objective: Vitals:   10/03/19 2106 10/03/19 2107 10/03/19 2142 10/04/19 0521  BP:   126/73 94/68  Pulse: 98  97 92  Resp: 16  18 20   Temp:   98.6 F (37 C) 97.8 F (36.6 C)  TempSrc:   Oral Oral  SpO2: 93% 93% 96% 100%  Weight:    91.9 kg  Height:        Intake/Output Summary (Last 24 hours) at 10/04/2019 0735 Last data filed at 10/04/2019 0525 Gross per 24 hour  Intake --  Output 1200 ml  Net -1200 ml   Filed Weights   09/28/19 1100 09/30/19 0317 10/04/19 0521  Weight: 90.5 kg 92.9 kg 91.9 kg    Examination:  General exam: No acute distress.  Chronically ill looking.  Looks older than stated age.  Currently still on 2 L oxygen via nasal cannula. Respiratory system: Bilateral decreased breath sounds at  bases with some crackles.  No wheezing Cardiovascular system: Rate controlled, S1-S2 heard Gastrointestinal system: Abdomen is obese, nondistended, soft and nontender.  Bowel sounds are heard  extremities: Bilateral lower extremity trace edema present.  No clubbing  Central nervous system: Alert and oriented.  Poor historian.   No focal neurological deficits.  Moves extremities Skin: No obvious ecchymosis/lesions Psychiatry: Looks  anxious  Data Reviewed: I have personally reviewed following labs and imaging studies  CBC: Recent Labs  Lab 09/28/19 1158 09/28/19 1256 09/29/19 0641  WBC  --  11.3* 8.9  NEUTROABS  --  10.6*  --   HGB 11.9* 11.0* 11.8*  HCT 35.0* 36.2 38.1  MCV  --  103.4* 103.8*  PLT  --  307 010   Basic Metabolic Panel: Recent Labs  Lab 09/28/19 1158 09/29/19 0641 10/01/19 0939  NA 139 139 139  K 4.1 3.9 4.3  CL  --  96* 99  CO2  --  33* 29  GLUCOSE  --  94 134*  BUN  --  10 27*  CREATININE  --  0.67 0.70  CALCIUM  --  10.4* 10.1  MG  --  2.2 2.2  PHOS  --  4.1 3.8   GFR: Estimated Creatinine Clearance: 74.5 mL/min (by C-G formula based on SCr of 0.7 mg/dL). Liver Function Tests: No results for input(s): AST, ALT, ALKPHOS, BILITOT, PROT, ALBUMIN in the last 168 hours. No results for input(s): LIPASE, AMYLASE in the last 168 hours. No results for input(s): AMMONIA in the last 168 hours. Coagulation Profile: No results for input(s): INR, PROTIME in the last 168 hours. Cardiac Enzymes: No results for input(s): CKTOTAL, CKMB, CKMBINDEX, TROPONINI in the last 168 hours. BNP (last 3 results) No results for input(s): PROBNP in the last 8760 hours. HbA1C: No results for input(s): HGBA1C in the last 72 hours. CBG: Recent Labs  Lab 09/28/19 1142 09/28/19 1950  GLUCAP 120* 134*   Lipid Profile: No results for input(s): CHOL, HDL, LDLCALC, TRIG, CHOLHDL, LDLDIRECT in the last 72 hours. Thyroid Function Tests: No results for input(s): TSH, T4TOTAL, FREET4, T3FREE, THYROIDAB in the last 72 hours. Anemia Panel: No results for input(s): VITAMINB12, FOLATE, FERRITIN, TIBC, IRON, RETICCTPCT in the last 72 hours. Sepsis Labs: No results for input(s): PROCALCITON, LATICACIDVEN in the last 168 hours.  No results found for this or any previous visit (from the past 240 hour(s)).       Radiology Studies: No results found.      Scheduled Meds: . ALPRAZolam  0.25 mg Oral TID   . arformoterol  15 mcg Nebulization BID  . atorvastatin  40 mg Oral Daily  . baclofen  10 mg Oral TID  . budesonide (PULMICORT) nebulizer solution  0.5 mg Nebulization BID  . chlorhexidine  15 mL Mouth Rinse BID  . Chlorhexidine Gluconate Cloth  6 each Topical Daily  . docusate sodium  100 mg Oral BID  . enoxaparin (LOVENOX) injection  40 mg Subcutaneous Q24H  . famotidine  20 mg Oral QHS  . guaiFENesin  600 mg Oral BID  . lamoTRIgine  200 mg Oral QHS  . lidocaine  1 patch Transdermal Q24H  . loratadine  10 mg Oral QPM  . mouth rinse  15 mL Mouth Rinse q12n4p  . methylphenidate  72 mg Oral BH-q7a  . methylPREDNISolone (SOLU-MEDROL) injection  40 mg Intravenous Q8H  . montelukast  10 mg Oral QHS  .  morphine injection  2 mg Intravenous Once  .  pantoprazole  40 mg Oral Daily  . QUEtiapine  150 mg Oral QHS  . revefenacin  175 mcg Nebulization Daily  . risperiDONE  3 mg Oral QHS  . senna-docusate  1 tablet Oral BID  . venlafaxine XR  150 mg Oral QHS   Continuous Infusions:        Aline August, MD [facility] 10/04/2019, 7:35 AM

## 2019-10-05 MED ORDER — METHYLPREDNISOLONE SODIUM SUCC 40 MG IJ SOLR
40.0000 mg | INTRAMUSCULAR | Status: DC
  Administered 2019-10-06: 40 mg via INTRAVENOUS
  Filled 2019-10-05: qty 1

## 2019-10-05 NOTE — TOC Progression Note (Signed)
Transition of Care [facility]) - Progression Note    Patient Details  Name: Julia Daniels MRN: [id number] Date of Birth: March 04, 1950  Transition of Care [facility] CM/SW [place], [place] Phone Number: [phone] 10/05/2019, 12:33 PM  Clinical Narrative:     CSW met with patient and provided her with the 4 current bed offers. Patient explained that her friend would try to visit some of the facilities. CSW explained that it appeared that she is medically ready for discharge therefore a decision would need to be made soon. She also requested to see if she could wait until tomorrow to see if any more offers would come back.  TOC team will follow up with patient tomorrow for patient choice.  Expected Discharge Plan: [place] Barriers to Discharge: Continued Medical Work up  Expected Discharge Plan and Services Expected Discharge Plan: [place] arrangements for the past 2 months: Single Family Home                                       Social Determinants of Health (SDOH) Interventions    Readmission Risk Interventions No flowsheet data found.

## 2019-10-05 NOTE — Progress Notes (Addendum)
   Palliative Medicine Inpatient Follow Up Note  Reason for consult:  Goals of Care "End Stage COPD"  HPI:  Per intake H&P --> 69 year old female with history of COPD/asthma on intermittent oxygen at home, anxiety disorder/bipolar disorder, GERD, recent recurrent admissions for COPD exacerbation/respiratory failure was admitted from inpatient rehab with severe shortness of breath on 09/28/2019. During the hospitalization, she was transferred to ICU because of worsening hypoxia and required BiPAP. She did not wish to be intubated and she was changed to DNR status. She also had severe anxiety for which she had to be placed on Precedex drip which was subsequently discontinued. She has been transferred back to [facility] service on 10/03/2019.  Palliative care was asked to get involved to further discuss goals of care in the setting of progressive COPD.  Today's Discussion (10/05/2019): Chart reviewed. I met with Julia Daniels at bedside, she was awake and conversant. She shares that she has been thinking quite a bit overnight and is coming to grips with how bad her diease process is. Allowed her time to express herself. She states that she is getting all of her affairs in order.   We talked more about her fears. She states that she hopes she is remembered for the good person that she is. She expresses being greatly spiritual and this helps her during these difficult times. I will request our chaplain to stop by.   Discussed the importance of continued conversation with family and their  medical providers regarding overall plan of care and treatment options, ensuring decisions are within the context of the patients values and GOCs.  Questions and concerns addressed   SUMMARY OF RECOMMENDATIONS DNAR/DNI  MOST Completed, paper copy placed onto the chart electric copy can be found in Vynca  DNR Form Completed, paper copy placed onto the chart electric copy can be found in Vynca  Goals: Patient would  like to pursue a trial at SNF to see if she can get stronger - she is aware the her COPD is severe but is not quite ready for hospice yet  [facility] involved for OP Palliative care  Chaplain - Advance directives and spiritual support  PMT will continue to check in incrementally - please call for any urgent needs  Symptom Management: Anxiety: - On alprazolam TID  Dyspnea: - Morphine 2.31m PO Q2H PRN  Generalized Weakness: -PT/OT    Time Spent: 25 Greater than 50% of the time was spent in counseling and coordination of care ______________________________________________________________________________________ [place] Team Cell Phone: [phone] utilize secure chat with additional questions, if there is no response within 30 minutes please call the above phone number  Palliative Medicine Team providers are available by phone from 7am to 7pm daily and can be reached through the team cell phone.  Should this patient require assistance outside of these hours, please call the patient's attending physician.

## 2019-10-05 NOTE — Progress Notes (Signed)
Chaplain responded to spiritual care consult. Pt is very pleasant, talkative and open to discussing her struggles.  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  Most recent church connection was [employer]  in [place].  Chaplain encouraged pt to be in touch with the church to find out about online resources, such as Teacher, music. Part of her faith includes a belief in angels. Pt states that she is not afraid of death per se, though somewhat anxious about the actual dying part. Feels very supported by [facility] in this area. Her greatest worries revolve around her sons: the youngest, [name], with whom she has lived for many years - they are extremely close - and Jenny Reichmann, her oldest son who lives about an hour away in some kind of transition home.  Jenny Reichmann has had many problems with drugs, overdoses and suicide attempts. There is friction between the sons because of this. Oldest son feels disconnected and "out of the loop".  Younger son blames older son for stress on mom. Pt worries how younger son will cope after her death.  Resources and support in family systems counseling would be beneficial to pt. Chaplain also suggested hospice bereavement resources as a possible resource for her son, [name], following the pt's transition to DNR and a recent very serious breathing crisis.  Pt welcomed prayer by chaplain.   Julia Daniels [phone]    10/05/19 1100  Clinical Encounter Type  Visited With Patient  Visit Type Initial;Spiritual support  Referral From Palliative care team  Consult/Referral To Chaplain  Spiritual Encounters  Spiritual Needs Prayer  Stress Factors  Patient Stress Factors Family relationships

## 2019-10-05 NOTE — Progress Notes (Signed)
Patient ID: Julia Daniels, female   DOB: 12-Dec-1950, 69 y.o.   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  During the hospitalization, she was transferred to ICU because of worsening hypoxia and required BiPAP.  She did not wish to be intubated and she was changed to DNR status.  She also had severe anxiety for which she had to be placed on Precedex drip which was subsequently discontinued.  She has been transferred back to [facility] service on 10/03/2019.  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  She has been made a DNR and intermittently tolerated BiPAP in the ICU - Currently on 2 L oxygen via nasal cannula -Decrease Solu-Medrol to 40 mg IV every 24 hours.  Switch to oral prednisone tomorrow.  Continue nebs. -Patient has had multiple admissions for COPD exacerbation recently and worsening anxiety has played a big factor in the same. -Palliative care consultation evaluation appreciated: Her overall prognosis seems poor with recurrent hospitalizations.  If condition continues to worsen, consider hospice -Continue montelukast  Anxiety/bipolar disorder Chronic pain -Off Precedex drip.  Continue risperidone, quetiapine, lamotrigine, methylphenidate, baclofen, venlafaxine and as needed Ativan  Generalized deconditioning -PT recommends SNF placement.  Social worker following. -Will need outpatient palliative care follow-up.  Obesity -Outpatient  follow-up  DVT prophylaxis: Lovenox Code Status: DNR Family Communication: None at bedside Disposition Plan: Status is: Inpatient  Remains inpatient appropriate because:Inpatient level of care appropriate due to severity of illness   Dispo: The patient is from: CIR              Anticipated d/c is to: SNF              Anticipated d/c date is: 1 day              Patient currently is medically stable to d/c.   Consultants: Palliative care/PCCM  Procedures: None  Antimicrobials:  Anti-infectives (From admission, onward)   None       Subjective: Patient seen and examined at bedside.  Poor historian.  States that her breathing is improving.  Still intermittently short of breath with exertion.  No overnight fever, vomiting, chest pain reported. Objective: Vitals:   10/04/19 1239 10/04/19 2035 10/05/19 0502 10/05/19 0742  BP: (!) 150/81 (!) 158/93 (!) 160/91   Pulse: 95 93 (!) 104 (!) 105  Resp: 20 20 20 19   Temp: 99.2 F (37.3 C) 98.5 F (36.9 C) 98.4 F (36.9 C)   TempSrc: Oral Oral Oral   SpO2: 96% 96% 96% 97%  Weight:   93.4 kg   Height:        Intake/Output Summary (Last 24 hours) at 10/05/2019 0747 Last data filed at 10/05/2019 0500 Gross per 24 hour  Intake 480 ml  Output 1450 ml  Net -970 ml   Filed Weights   09/30/19 0317 10/04/19 0521 10/05/19 0502  Weight: 92.9 kg 91.9 kg 93.4 kg    Examination:  General exam: No distress.  Chronically ill looking.  Looks older than stated age.  Still on 2 L  oxygen via nasal cannula. Respiratory system: Bilateral decreased breath sounds at bases with scattered crackles  cardiovascular system: S1-S2 heard, tachycardic Gastrointestinal system: Abdomen is obese, nondistended, soft and nontender.  Normal bowel sounds are heard  extremities: No clubbing/cyanosis.  Mild lower extremity edema present.   Data Reviewed: I have personally reviewed following labs and imaging studies  CBC: Recent Labs  Lab 09/28/19 1158  09/28/19 1256 09/29/19 0641  WBC  --  11.3* 8.9  NEUTROABS  --  10.6*  --   HGB 11.9* 11.0* 11.8*  HCT 35.0* 36.2 38.1  MCV  --  103.4* 103.8*  PLT  --  307 629   Basic Metabolic Panel: Recent Labs  Lab 09/28/19 1158 09/29/19 0641 10/01/19 0939  NA 139 139 139  K 4.1 3.9 4.3  CL  --  96* 99  CO2  --  33* 29  GLUCOSE  --  94 134*  BUN  --  10 27*  CREATININE  --  0.67 0.70  CALCIUM  --  10.4* 10.1  MG  --  2.2 2.2  PHOS  --  4.1 3.8   GFR: Estimated Creatinine Clearance: 75.1 mL/min (by C-G formula based on SCr of 0.7 mg/dL). Liver Function Tests: No results for input(s): AST, ALT, ALKPHOS, BILITOT, PROT, ALBUMIN in the last 168 hours. No results for input(s): LIPASE, AMYLASE in the last 168 hours. No results for input(s): AMMONIA in the last 168 hours. Coagulation Profile: No results for input(s): INR, PROTIME in the last 168 hours. Cardiac Enzymes: No results for input(s): CKTOTAL, CKMB, CKMBINDEX, TROPONINI in the last 168 hours. BNP (last 3 results) No results for input(s): PROBNP in the last 8760 hours. HbA1C: No results for input(s): HGBA1C in the last 72 hours. CBG: Recent Labs  Lab 09/28/19 1142 09/28/19 1950  GLUCAP 120* 134*   Lipid Profile: No results for input(s): CHOL, HDL, LDLCALC, TRIG, CHOLHDL, LDLDIRECT in the last 72 hours. Thyroid Function Tests: No results for input(s): TSH, T4TOTAL, FREET4, T3FREE, THYROIDAB in the last 72 hours. Anemia Panel: No results for input(s): VITAMINB12, FOLATE, FERRITIN, TIBC, IRON, RETICCTPCT in the last 72 hours. Sepsis Labs: No results for input(s): PROCALCITON, LATICACIDVEN in the last 168 hours.  No results found for this or any previous visit (from the past 240 hour(s)).       Radiology Studies: No results found.      Scheduled Meds:  ALPRAZolam  0.25 mg Oral TID   arformoterol  15 mcg Nebulization BID   atorvastatin  40 mg Oral Daily   baclofen  10 mg Oral TID   budesonide (PULMICORT)  nebulizer solution  0.5 mg Nebulization BID   chlorhexidine  15 mL Mouth Rinse BID   Chlorhexidine Gluconate Cloth  6 each Topical Daily   docusate sodium  100 mg Oral BID   enoxaparin (LOVENOX) injection  40 mg Subcutaneous Q24H   famotidine  20 mg Oral QHS   guaiFENesin  600 mg Oral BID   lamoTRIgine  200 mg Oral QHS   lidocaine  1 patch Transdermal Q24H   loratadine  10 mg Oral QPM   mouth rinse  15 mL Mouth Rinse q12n4p   methylphenidate  72 mg Oral BH-q7a   methylPREDNISolone (SOLU-MEDROL) injection  40 mg Intravenous Q12H   montelukast  10 mg Oral QHS    morphine injection  2 mg Intravenous Once   pantoprazole  40 mg Oral Daily   QUEtiapine  150 mg Oral QHS   revefenacin  175 mcg Nebulization Daily   risperiDONE  3 mg Oral QHS   senna-docusate  1 tablet Oral BID   venlafaxine XR  150 mg Oral QHS   Continuous Infusions:        Aline August, MD [facility] 10/05/2019, 7:47 AM

## 2019-10-06 MED ORDER — PREDNISONE 20 MG PO TABS
40.0000 mg | ORAL_TABLET | Freq: Every day | ORAL | Status: DC
  Administered 2019-10-07: 40 mg via ORAL
  Filled 2019-10-06: qty 2

## 2019-10-06 NOTE — Progress Notes (Signed)
Patient ID: Julia Daniels, female   DOB: 10-Oct-1950, 69 y.o.   MRN: [id number]  PROGRESS NOTE    Julia Daniels  QIH:[id number] DOB: 1950-12-14 DOA: 09/28/2019 PCP: Simona Huh, NP   Brief Narrative:  69 year old female with history of COPD/asthma on intermittent oxygen at home, anxiety disorder/bipolar disorder, GERD, recent recurrent admissions for COPD exacerbation/respiratory failure was admitted from inpatient rehab with severe shortness of breath on 09/28/2019.  During the hospitalization, she was transferred to ICU because of worsening hypoxia and required BiPAP.  She did not wish to be intubated and she was changed to DNR status.  She also had severe anxiety for which she had to be placed on Precedex drip which was subsequently discontinued.  She has been transferred back to [facility] service on 10/03/2019.  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  She has been made a DNR and intermittently tolerated BiPAP in the ICU - Currently on 2 L oxygen via nasal cannula -Currently on Solu-Medrol 40 mg IV every 24 hours.  Switch to oral prednisone from tomorrow.  Continue nebs. -Patient has had multiple admissions for COPD exacerbation recently and worsening anxiety has played a big factor in the same. -Palliative care consultation evaluation appreciated: Her overall prognosis seems poor with recurrent hospitalizations.  If condition continues to worsen, consider hospice -Continue montelukast  Anxiety/bipolar disorder Chronic pain -Off Precedex drip.  Continue risperidone, quetiapine, lamotrigine, methylphenidate, baclofen, venlafaxine and as needed Ativan  Generalized deconditioning -PT recommends SNF placement.  Social worker following. -Will need outpatient palliative care follow-up.  Obesity -Outpatient  follow-up  DVT prophylaxis: Lovenox Code Status: DNR Family Communication: None at bedside Disposition Plan: Status is: Inpatient  Remains inpatient appropriate because:Inpatient level of care appropriate due to severity of illness.  Discharge to SNF as soon as bed is available.   Dispo: The patient is from: CIR              Anticipated d/c is to: SNF              Anticipated d/c date is: 1 day              Patient currently is medically stable to d/c.   Consultants: Palliative care/PCCM  Procedures: None  Antimicrobials:  Anti-infectives (From admission, onward)   None       Subjective: Patient seen and examined at bedside.  Denies worsening shortness of breath, chest pain, fever or vomiting.  Breathing is improving.   Objective: Vitals:   10/05/19 1950 10/05/19 1952 10/05/19 2107 10/06/19 0626  BP:   (!) 115/95 103/78  Pulse: 95  93 98  Resp: 16   17  Temp:   98.5 F (36.9 C) 97.8 F (36.6 C)  TempSrc:      SpO2: 95% 95% 95% 95%  Weight:      Height:        Intake/Output Summary (Last 24 hours) at 10/06/2019 0742 Last data filed at 10/05/2019 1300 Gross per 24 hour  Intake 240 ml  Output 1600 ml  Net -1360 ml   Filed Weights   09/30/19 0317 10/04/19 0521 10/05/19 0502  Weight: 92.9 kg 91.9 kg 93.4 kg    Examination:  General exam: No acute distress.  Chronically ill looking.  Looks older than stated age.  Currently on 2 L oxygen by nasal cannula.  Poor historian. Respiratory system: Bilateral decreased breath sounds at bases no wheezing.  Some scattered crackles  cardiovascular system: S1-S2 heard, rate controlled Gastrointestinal system: Abdomen is obese, nondistended, soft and nontender.  Bowel sounds heard extremities: Trace lower extremity edema present.  No clubbing   Data Reviewed: I have personally reviewed following labs and imaging studies  CBC: No results for input(s): WBC, NEUTROABS, HGB, HCT, MCV, PLT in the last 168 hours. Basic  Metabolic Panel: Recent Labs  Lab 10/01/19 0939  NA 139  K 4.3  CL 99  CO2 29  GLUCOSE 134*  BUN 27*  CREATININE 0.70  CALCIUM 10.1  MG 2.2  PHOS 3.8   GFR: Estimated Creatinine Clearance: 75.1 mL/min (by C-G formula based on SCr of 0.7 mg/dL). Liver Function Tests: No results for input(s): AST, ALT, ALKPHOS, BILITOT, PROT, ALBUMIN in the last 168 hours. No results for input(s): LIPASE, AMYLASE in the last 168 hours. No results for input(s): AMMONIA in the last 168 hours. Coagulation Profile: No results for input(s): INR, PROTIME in the last 168 hours. Cardiac Enzymes: No results for input(s): CKTOTAL, CKMB, CKMBINDEX, TROPONINI in the last 168 hours. BNP (last 3 results) No results for input(s): PROBNP in the last 8760 hours. HbA1C: No results for input(s): HGBA1C in the last 72 hours. CBG: No results for input(s): GLUCAP in the last 168 hours. Lipid Profile: No results for input(s): CHOL, HDL, LDLCALC, TRIG, CHOLHDL, LDLDIRECT in the last 72 hours. Thyroid Function Tests: No results for input(s): TSH, T4TOTAL, FREET4, T3FREE, THYROIDAB in the last 72 hours. Anemia Panel: No results for input(s): VITAMINB12, FOLATE, FERRITIN, TIBC, IRON, RETICCTPCT in the last 72 hours. Sepsis Labs: No results for input(s): PROCALCITON, LATICACIDVEN in the last 168 hours.  No results found for this or any previous visit (from the past 240 hour(s)).       Radiology Studies: No results found.      Scheduled Meds: . ALPRAZolam  0.25 mg Oral TID  . arformoterol  15 mcg Nebulization BID  . atorvastatin  40 mg Oral Daily  . baclofen  10 mg Oral TID  . budesonide (PULMICORT) nebulizer solution  0.5 mg Nebulization BID  . chlorhexidine  15 mL Mouth Rinse BID  . Chlorhexidine Gluconate Cloth  6 each Topical Daily  . docusate sodium  100 mg Oral BID  . enoxaparin (LOVENOX) injection  40 mg Subcutaneous Q24H  . famotidine  20 mg Oral QHS  . guaiFENesin  600 mg Oral BID  .  lamoTRIgine  200 mg Oral QHS  . lidocaine  1 patch Transdermal Q24H  . loratadine  10 mg Oral QPM  . mouth rinse  15 mL Mouth Rinse q12n4p  . methylphenidate  72 mg Oral BH-q7a  . methylPREDNISolone (SOLU-MEDROL) injection  40 mg Intravenous Q24H  . montelukast  10 mg Oral QHS  .  morphine injection  2 mg Intravenous Once  . pantoprazole  40 mg Oral Daily  . QUEtiapine  150 mg Oral QHS  . revefenacin  175 mcg Nebulization Daily  . risperiDONE  3 mg Oral QHS  . senna-docusate  1 tablet Oral BID  . venlafaxine XR  150 mg Oral QHS   Continuous Infusions:        Aline August, MD [facility] 10/06/2019, 7:42 AM

## 2019-10-06 NOTE — TOC Progression Note (Addendum)
Transition of Care [facility]) - Progression Note    Patient Details  Name: Julia Daniels MRN: [id number] Date of Birth: 1950/07/17  Transition of Care [facility]) CM/SW [place], [place] Phone Number: 10/06/2019, 12:35 PM  Clinical Narrative:     CSW spoke with patient at bedside. Patient is agreeable to SNF placement at [facility]. CSW reached out to [place] with [facility] and she confirmed they can accept patient for SNF placement. Juliann Pulse started insurance authorization for patient. Juliann Pulse confirmed that this insurance is not apart of waiver. Patients PASSR number is also pending.CSW updated Venia Carbon with [facility] of patients SNF choice.  Patient has SN[facility] bed at [facility]. PASSR number is under review. Insurance authorization is pending.  CSW will continue to follow.   Expected Discharge Plan: [place] Barriers to Discharge: Continued Medical Work up  Expected Discharge Plan and Services Expected Discharge Plan: [place] arrangements for the past 2 months: Single Family Home                                       Social Determinants of Health (SDOH) Interventions    Readmission Risk Interventions No flowsheet data found.

## 2019-10-06 NOTE — Progress Notes (Signed)
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 Melvyn Novas, anxiety disorder presenting from rehab with severe SOB on BiPAP- requiring tx to ICU for possible intubation and precedex for severe anxiety.  Patient stated she would never want intubation/ MV; made DNI/ DNR.    PT Comments    Pt sitting up in bed talking to friend on entry, agreeable to trying to get up with therapy today. Pt continues to be limited in safe mobility by 4/4 DoE with activity and associated anxiety, in presence of generalized weakness and back pain. Focus of session on mobility with decreased anxiety. Pt requires min guard for bed mobility, modA for donning TLSO, supervision for transfers and min guard for ambulation with RW. Pt able to perform to bouts of sit<>stand in RW and marching in place x10. Pt educated that given amount of marching in place she should feel confident about walking to door and back in next session. D/c plans remain appropriate. PT will continue to follow acutely.    Follow Up Recommendations  SNF (may progress to home but with limited caregiver support)     Equipment Recommendations  Rolling walker with 5" wheels       Precautions / Restrictions Precautions Precautions: Fall;Back Precaution Comments: T6 compression fx, COPD, anxiety Required Braces or Orthoses: Spinal Brace Spinal Brace: Thoracolumbosacral orthotic;Applied in sitting position Restrictions Weight Bearing Restrictions: No    Mobility  Bed Mobility Overal bed mobility: Needs Assistance Bed Mobility: Rolling Rolling: Supervision Sidelying to sit: Min guard       General bed mobility comments: supervision to roll onto side and min guard for pushing up into sitting   Transfers Overall transfer level: Needs assistance Equipment used:  Rolling walker (2 wheeled) Transfers: Sit to/from Stand Sit to Stand: Supervision         General transfer comment: supervsion for safety with 1x power up from bed and 2x power up from straight back chair with armrest  Ambulation/Gait Ambulation/Gait assistance: Min guard Gait Distance (Feet): 3 Feet Assistive device: Rolling walker (2 wheeled) Gait Pattern/deviations: Decreased stride length;Step-to pattern Gait velocity: decreased Gait velocity interpretation: <1.31 ft/sec, indicative of household ambulator General Gait Details: min guard for safety with stepping transfer to straight back chair at side of bed      Balance Overall balance assessment: Needs assistance Sitting-balance support: Feet supported Sitting balance-Leahy Scale: Fair     Standing balance support: Single extremity supported Standing balance-Leahy Scale: Poor Standing balance comment: requires at least single UE support                             Cognition Arousal/Alertness: Awake/alert Behavior During Therapy: Anxious Overall Cognitive Status: Within Functional Limits for tasks assessed                                 General Comments: feeling better and more in control of her mobility      Exercises General Exercises - Lower Extremity Hip Flexion/Marching: AROM;10 reps;Standing (2x bouts)    General Comments General comments (skin integrity, edema, etc.): pt on 2L O2 via [place] with SaO2 >92%O2, HR max noted 135bpm      Pertinent Vitals/Pain Pain Assessment: Faces Faces Pain Scale: Hurts  a little bit Pain Location: back with movement  Pain Descriptors / Indicators: Sore Pain Intervention(s): Limited activity within patient's tolerance;Monitored during session;Repositioned           PT Goals (current goals can now be found in the care plan section) Acute Rehab PT Goals Patient Stated Goal: to improve mobility and return home PT Goal Formulation: With  patient Time For Goal Achievement: 10/16/19 Potential to Achieve Goals: Fair Progress towards PT goals: Progressing toward goals    Frequency    Min 3X/week      PT Plan Current plan remains appropriate       AM-PAC PT "6 Clicks" Mobility   Outcome Measure  Help needed turning from your back to your side while in a flat bed without using bedrails?: None Help needed moving from lying on your back to sitting on the side of a flat bed without using bedrails?: A Little Help needed moving to and from a bed to a chair (including a wheelchair)?: None Help needed standing up from a chair using your arms (e.g., wheelchair or bedside chair)?: None Help needed to walk in hospital room?: A Lot Help needed climbing 3-5 steps with a railing? : Total 6 Click Score: 18    End of Session Equipment Utilized During Treatment: Oxygen;Back brace Activity Tolerance: Other (comment) (limited by anxiety) Patient left: with call bell/phone within reach;in chair;with chair alarm set Nurse Communication: Mobility status PT Visit Diagnosis: Muscle weakness (generalized) (M62.81);Difficulty in walking, not elsewhere classified (R26.2);Other (comment) (cardiopulmonary function)     Time: 5284-1324 PT Time Calculation (min) (ACUTE ONLY): 28 min  Charges:  $Therapeutic Exercise: 8-22 mins $Therapeutic Activity: 8-22 mins                     Baneza B. Migdalia Dk PT, DPT Acute Rehabilitation Services Pager [phone] Office [phone]    [place] 10/06/2019, 3:27 PM

## 2019-10-06 NOTE — Discharge Summary (Addendum)
Physician Discharge Summary  DEVOTA VIRUET TLX:[id number] DOB: Oct 07, 1950 DOA: 09/28/2019  PCP: Simona Huh, NP  Admit date: 09/28/2019 Discharge date: 10/07/2019  Admitted From: CIR Disposition: SNF  Recommendations for Outpatient Follow-up:  1. Follow up with PCP in 1 week with repeat CBC/BMP 2. Outpatient evaluation and follow-up by pulmonary 3. Outpatient follow-up by palliative care 4. Follow up in ED if symptoms worsen or new appear   Home Health: No Equipment/Devices: Oxygen via nasal cannula 2 L/min  Discharge Condition: Stable CODE STATUS: DNR Diet recommendation: Heart healthy  Brief/Interim Summary: 69 year old female with history of COPD/asthma on intermittent oxygen at home, anxiety disorder/bipolar disorder, GERD, recent recurrent admissions for COPD exacerbation/respiratory failure was admitted from inpatient rehab with severe shortness of breath on 09/28/2019.  During the hospitalization, she was transferred to ICU because of worsening hypoxia and required BiPAP.  She did not wish to be intubated and she was changed to DNR status.  She also had severe anxiety for which she had to be placed on Precedex drip which was subsequently discontinued.  She has been transferred back to [facility] service on 10/03/2019.  During the hospitalization, respiratory status improved and has remained stable.  PT recommended SNF placement.  She will be discharged to SNF once bed is available on oral prednisone.  Discharge Diagnoses:   Acute on chronic hypoxic/hypercarbic respiratory failure COPD exacerbation -Patient had to be moved to ICU because of severe hypoxia/hypercarbia worsened secondary to severe anxiety component as well.  She has been made a DNR and intermittently tolerated BiPAP in the ICU - Currently on 2 L oxygen via nasal cannula -Treated with tapering intravenous Solu-Medrol along with nebs. -Patient has had multiple admissions for COPD exacerbation recently and worsening  anxiety has played a big factor in the same. -Palliative care consultation evaluation appreciated: Her overall prognosis seems poor with recurrent hospitalizations.  If condition continues to worsen, consider hospice -Discharge to SNF on oral prednisone 40 mg daily for 7 days.  Outpatient follow-up with pulmonary  Anxiety/bipolar disorder Chronic pain -Off Precedex drip.  Continue risperidone, quetiapine, lamotrigine, methylphenidate, baclofen, venlafaxine -Outpatient follow-up with PCP/psychiatry  Generalized deconditioning -PT recommends SNF placement.    Discharge to SNF once bed is available -Will need outpatient palliative care follow-up.  Obesity -Outpatient follow-up  Discharge Instructions    Allergies as of 10/07/2019      Reactions   Sulfa Antibiotics Other (See Comments)   Mouth gets raw      Medication List    TAKE these medications   AeroChamber MV inhaler Use as instructed   albuterol 108 (90 Base) MCG/ACT inhaler Commonly known as: ProAir HFA Inhale 2 puffs into the lungs every 6 (six) hours as needed for wheezing or shortness of breath.   albuterol (2.5 MG/3ML) 0.083% nebulizer solution Commonly known as: PROVENTIL Take 3 mLs (2.5 mg total) by nebulization every 6 (six) hours as needed for wheezing or shortness of breath.   ALPRAZolam 0.25 MG tablet Commonly known as: XANAX Take 1 tablet (0.25 mg total) by mouth daily as needed for anxiety.   atorvastatin 40 MG tablet Commonly known as: Lipitor Take 1 tablet (40 mg total) by mouth daily.   baclofen 10 MG tablet Commonly known as: LIORESAL Take 10 mg by mouth 3 (three) times daily as needed.   budesonide-formoterol 160-4.5 MCG/ACT inhaler Commonly known as: Symbicort INHALE 2 PUFFS INTO LUNGS TWICE A DAY What changed:   how much to take  how to take this  when  to take this  additional instructions   EPINEPHrine 0.3 mg/0.3 mL Soaj injection Commonly known as: EPI-PEN Inject 0.3 mLs (0.3  mg total) into the muscle as needed for anaphylaxis.   famotidine 20 MG tablet Commonly known as: PEPCID One at bedtime What changed:   how much to take  how to take this  when to take this  additional instructions   Fasenra 30 MG/ML Sosy Generic drug: Benralizumab INJECT 1 SYRINGE UNDER THE SKIN EVERY 8 WEEKS. What changed: See the new instructions.   guaiFENesin 600 MG 12 hr tablet Commonly known as: MUCINEX Take 1 tablet (600 mg total) by mouth 2 (two) times daily.   HYDROcodone-acetaminophen 5-325 MG tablet Commonly known as: NORCO/VICODIN Take 1 tablet by mouth 3 (three) times daily as needed for moderate pain. What changed:   when to take this  reasons to take this   ibuprofen 800 MG tablet Commonly known as: ADVIL Take 800 mg by mouth 2 (two) times daily as needed for pain.   lamoTRIgine 200 MG tablet Commonly known as: LAMICTAL Take 200 mg by mouth at bedtime.   lidocaine 5 % Commonly known as: LIDODERM Place 1 patch onto the skin daily. Remove & Discard patch within 12 hours or as directed by MD   loratadine 10 MG tablet Commonly known as: CLARITIN Take 10 mg by mouth every evening.   methylphenidate 54 MG CR tablet Commonly known as: CONCERTA Take 54 mg by mouth every morning. Take with 18 mg tablet= 72mg    methylphenidate 18 MG CR tablet Commonly known as: CONCERTA Take 18 mg by mouth daily. Take with 54 mg tablet= 72mg    montelukast 10 MG tablet Commonly known as: SINGULAIR Take 1 tablet (10 mg total) by mouth at bedtime.   pantoprazole 40 MG tablet Commonly known as: Protonix Take 1 tablet (40 mg total) by mouth daily. Take 30-60 min before first meal of the day   predniSONE 20 MG tablet Commonly known as: DELTASONE Take 2 tablets (40 mg total) by mouth daily with breakfast for 7 days.   QUEtiapine 100 MG tablet Commonly known as: SEROQUEL Take 150 mg by mouth at bedtime.   risperiDONE 1 MG tablet Commonly known as:  RISPERDAL Take 3 mg by mouth at bedtime.   senna-docusate 8.6-50 MG tablet Commonly known as: Senokot-S Take 1 tablet by mouth 2 (two) times daily.   Spiriva Respimat 2.5 MCG/ACT Aers Generic drug: Tiotropium Bromide Monohydrate Inhale 2 puffs into the lungs daily. What changed: Another medication with the same name was removed. Continue taking this medication, and follow the directions you see here.   venlafaxine XR 150 MG 24 hr capsule Commonly known as: EFFEXOR-XR Take 150 mg by mouth at bedtime.   Vitamin D (Ergocalciferol) 1.25 MG (50000 UNIT) Caps capsule Commonly known as: DRISDOL Take 50,000 Units by mouth every Saturday.         Contact information for after-discharge care    Destination    [facility] Preferred SNF .   Service: Skilled Nursing Contact information: [address] [address] [phone]                 Allergies  Allergen Reactions  . Sulfa Antibiotics Other (See Comments)    Mouth gets raw    Consultations:  PCCM/palliative care   Procedures/Studies: DG Thoracic Spine W/Swimmers  Result Date: 09/23/2019 CLINICAL DATA:  Pain.  Additional provided: Upper to mid back pain. EXAM: THORACIC SPINE - 3  VIEWS COMPARISON:  CT angiogram chest 09/08/2019. FINDINGS: Redemonstrated T5 compression fracture with severe vertebral body height loss. There is no definite significant bony retropulsion at this level. No appreciable significant spondylolisthesis. No interval thoracic vertebral compression fracture is identified as compared to the prior examination of 09/08/2019. Multilevel disc space narrowing, degenerative endplate sclerosis and prominent ventrolateral osteophytes. IMPRESSION: Redemonstrated severe T5 compression fracture. No appreciable interval thoracic vertebral compression fracture as compared to the CT chest of 09/08/2019. Thoracic spondylosis as described. Electronically Signed   By: Kellie Simmering DO    On: 09/23/2019 13:05   CT ANGIO CHEST PE W OR WO CONTRAST  Result Date: 09/08/2019 CLINICAL DATA:  COPD exacerbation. EXAM: CT ANGIOGRAPHY CHEST WITH CONTRAST TECHNIQUE: Multidetector CT imaging of the chest was performed using the standard protocol during bolus administration of intravenous contrast. Multiplanar CT image reconstructions and MIPs were obtained to evaluate the vascular anatomy. CONTRAST:  159mL OMNIPAQUE IOHEXOL 350 MG/ML SOLN COMPARISON:  August 23, 2017 FINDINGS: Cardiovascular: Satisfactory opacification of the pulmonary arteries to the segmental level. No evidence of pulmonary embolism. Normal heart size. No pericardial effusion. Marked severity coronary artery calcification is noted Mediastinum/Nodes: No enlarged mediastinal, hilar, or axillary lymph nodes. Thyroid gland, trachea, and esophagus demonstrate no significant findings. Lungs/Pleura: Mild atelectasis is seen within the bilateral lung bases and posterior aspect of the left upper lobe. There is no evidence of a pleural effusion or pneumothorax. Upper Abdomen: A 2.6 cm cyst is seen within the right kidney. Musculoskeletal: A compression fracture deformity of indeterminate age is seen involving the T6 vertebral body. This represents a new finding compared to the prior exam. Multilevel degenerative changes are noted throughout the remainder of the thoracic spine. Review of the MIP images confirms the above findings. IMPRESSION: 1. No evidence of pulmonary embolism. 2. Mild bibasilar and left upper lobe atelectasis. 3. Marked severity coronary artery calcification. 4. Compression fracture deformity of indeterminate age involving the T6 vertebral body. This represents a new finding compared to the prior exam. Electronically Signed   By: Virgina Norfolk M.D.   On: 09/08/2019 20:24   CT ABDOMEN PELVIS W CONTRAST  Result Date: 09/08/2019 CLINICAL DATA:  COPD exacerbation, right flank pain, low back pain EXAM: CT ABDOMEN AND PELVIS  WITH CONTRAST TECHNIQUE: Multidetector CT imaging of the abdomen and pelvis was performed using the standard protocol following bolus administration of intravenous contrast. CONTRAST:  127mL OMNIPAQUE IOHEXOL 350 MG/ML SOLN COMPARISON:  None. FINDINGS: Lower chest: Scattered atelectasis or scarring at the lung bases. No acute pleural or parenchymal lung disease. Hepatobiliary: No focal liver abnormality is seen. No gallstones, gallbladder wall thickening, or biliary dilatation. Pancreas: Unremarkable. No pancreatic ductal dilatation or surrounding inflammatory changes. Spleen: Normal in size without focal abnormality. Adrenals/Urinary Tract: Small right renal cyst. Otherwise the kidneys enhance normally. No urinary tract calculi or obstructive uropathy. The adrenals and bladder are unremarkable. Stomach/Bowel: No bowel obstruction or ileus. No wall thickening or inflammatory change. The appendix, if still present, is not well visualized. There are no inflammatory changes to suggest appendicitis. Vascular/Lymphatic: Aortic atherosclerosis. No enlarged abdominal or pelvic lymph nodes. Reproductive: Status post hysterectomy. No adnexal masses. Other: No free fluid or free gas.  No abdominal wall hernia. Musculoskeletal: There are no acute displaced fractures. There is diffuse spondylosis throughout the thoracolumbar spine. From L3/L4 through L5/S1 there is circumferential disc bulge and bilateral facet hypertrophy resulting in mild central canal stenosis and symmetrical neural foraminal encroachment. These changes are most pronounced at the  L4/L5 and L5/S1 levels. Reconstructed images demonstrate no additional findings. IMPRESSION: 1. No acute intra-abdominal or intrapelvic process. 2. Prominent lower lumbar spondylosis greatest at L4/L5 and L5/S1. 3. Aortic Atherosclerosis (ICD10-I70.0). Electronically Signed   By: Randa Ngo M.D.   On: 09/08/2019 20:17   CT L-SPINE NO CHARGE  Result Date: 09/08/2019 CLINICAL  DATA:  COPD exacerbation, low back pain, right flank pain EXAM: CT LUMBAR SPINE WITHOUT CONTRAST TECHNIQUE: Multidetector CT imaging of the lumbar spine was performed without intravenous contrast administration. Multiplanar CT image reconstructions were also generated. COMPARISON:  None. FINDINGS: Segmentation: There are 5 non-rib-bearing lumbar type vertebral bodies. Alignment: Alignment is anatomic. Vertebrae: There are no acute displaced fractures. No destructive bony lesions. Paraspinal and other soft tissues: Paraspinal soft tissues are unremarkable. Atherosclerosis throughout the abdominal aorta. Disc levels: There is multilevel thoracolumbar spondylosis. Prominent anterior osteophytes are seen from T10 through L2. No significant central canal or neural foraminal encroachment. At L3/L4 there is bilateral left greater than right neural foraminal encroachment due to circumferential disc bulge and bilateral facet hypertrophy. And L4/L5 there is circumferential disc bulge with bilateral facet hypertrophy resulting in mild central stenosis and symmetrical neural foraminal encroachment. At L5/S1 there is mild circumferential disc bulge and bilateral facet hypertrophy with mild symmetrical neural foraminal encroachment. Reconstructed images demonstrate no additional findings. IMPRESSION: 1. Multilevel thoracolumbar spondylosis, greatest from L3/L4 through L5/S1 as above. 2. No acute bony abnormality. Electronically Signed   By: Randa Ngo M.D.   On: 09/08/2019 20:49   DG Chest Port 1 View  Result Date: 10/01/2019 CLINICAL DATA:  Normal respiration. EXAM: PORTABLE CHEST 1 VIEW COMPARISON:  September 30, 2019. FINDINGS: Stable cardiomediastinal silhouette. No pneumothorax or pleural effusion is noted. Stable bibasilar subsegmental atelectasis or infiltrates are noted. The visualized skeletal structures are unremarkable. IMPRESSION: Stable bibasilar subsegmental atelectasis or infiltrates. Electronically Signed    By: Marijo Conception M.D.   On: 10/01/2019 08:01   DG Chest Port 1 View  Result Date: 09/30/2019 CLINICAL DATA:  Respiratory distress.  Patient on BiPAP machine. EXAM: PORTABLE CHEST 1 VIEW COMPARISON:  09/28/2019 FINDINGS: Lungs are hypoinflated demonstrate persistent bibasilar/infrahilar linear opacification likely atelectasis although infection is possible. No effusion. Cardiomediastinal silhouette and remainder of the exam is unchanged. IMPRESSION: Hypoinflation with stable bibasilar/infrahilar linear opacification likely atelectasis, although infection is possible. Electronically Signed   By: Marin Olp M.D.   On: 09/30/2019 09:30   DG CHEST PORT 1 VIEW  Result Date: 09/28/2019 CLINICAL DATA:  Shortness of breath EXAM: PORTABLE CHEST 1 VIEW COMPARISON:  September 25, 2019 FINDINGS: The cardiomediastinal silhouette is unchanged and enlarged in contour. No pleural effusion. No pneumothorax. There are scattered bibasilar linear opacities, mildly increased in comparison to prior. Unchanged linear opacity of the LEFT upper lobe. Emphysema. Visualized abdomen is unremarkable. Multilevel degenerative changes of the thoracic spine. Compression fracture of the midthoracic spine, unchanged. IMPRESSION: Scattered bibasilar linear opacities, mildly increased in comparison to prior. These are favored to reflect increasing atelectasis. Electronically Signed   By: Valentino Saxon MD   On: 09/28/2019 10:42   DG CHEST PORT 1 VIEW  Result Date: 09/25/2019 CLINICAL DATA:  Shortness of breath. EXAM: PORTABLE CHEST 1 VIEW COMPARISON:  09/08/2019 FINDINGS: The heart is mildly enlarged but stable. There is tortuosity and mild calcification of the thoracic aorta. No infiltrates or effusions. Streaky basilar scarring changes. IMPRESSION: No acute cardiopulmonary findings. Electronically Signed   By: Marijo Sanes M.D.   On: 09/25/2019 06:38  DG Chest Port 1 View  Result Date: 09/08/2019 CLINICAL DATA:  Dyspnea  and shortness of breath x2 days. EXAM: PORTABLE CHEST 1 VIEW COMPARISON:  August 27, 2019 FINDINGS: Mild atelectasis and/or infiltrate is seen within the left lung base. There is no evidence of a pleural effusion or pneumothorax. The heart size and mediastinal contours are within normal limits. Tortuosity of the descending thoracic aorta is seen. The visualized skeletal structures are unremarkable. IMPRESSION: Mild left basilar atelectasis and/or infiltrate. Electronically Signed   By: Virgina Norfolk M.D.   On: 09/08/2019 16:30   ECHOCARDIOGRAM COMPLETE  Result Date: 09/09/2019    ECHOCARDIOGRAM REPORT   Patient Name:   ALAJAH WITMAN Date of Exam: 09/09/2019 Medical Rec #:  [id number]           Height:       63.0 in Accession #:    [id number]          Weight:       196.2 lb Date of Birth:  01/19/1950          BSA:          1.918 m Patient Age:    69 years            BP:           146/109 mmHg Patient Gender: F                   HR:           110 bpm. Exam Location:  Inpatient Procedure: 2D Echo, Cardiac Doppler and Color Doppler Indications:    ACUTE rESPIRATORY INSUFFICIENCY 518.82 / r06.89  History:        Patient has no prior history of Echocardiogram examinations.                 COPD.  Sonographer:    Jonelle Sidle Dance Referring Phys: [id number] [address]  Sonographer Comments: No subcostal window. IMPRESSIONS  1. Left ventricular ejection fraction, by estimation, is 55%. The left ventricle has normal function. The left ventricle demonstrates regional wall motion abnormalities with basal inferior akinesis. Left ventricular diastolic parameters are consistent with Grade I diastolic dysfunction (impaired relaxation).  2. Right ventricular systolic function is normal. The right ventricular size is normal. Tricuspid regurgitation signal is inadequate for assessing PA pressure.  3. The mitral valve is normal in structure. No evidence of mitral valve regurgitation. No evidence of mitral stenosis.  4. The  aortic valve is tricuspid. Aortic valve regurgitation is not visualized. No aortic stenosis is present.  5. Aortic dilatation noted. There is mild dilatation of the ascending aorta measuring 39 mm.  6. The inferior vena cava is normal in size with greater than 50% respiratory variability, suggesting right atrial pressure of 3 mmHg. FINDINGS  Left Ventricle: Left ventricular ejection fraction, by estimation, is 55%. The left ventricle has normal function. The left ventricle demonstrates regional wall motion abnormalities. The left ventricular internal cavity size was normal in size. There is  no left ventricular hypertrophy. Left ventricular diastolic parameters are consistent with Grade I diastolic dysfunction (impaired relaxation). Right Ventricle: The right ventricular size is normal. No increase in right ventricular wall thickness. Right ventricular systolic function is normal. Tricuspid regurgitation signal is inadequate for assessing PA pressure. Left Atrium: Left atrial size was normal in size. Right Atrium: Right atrial size was normal in size. Pericardium: There is no evidence of pericardial effusion. Mitral Valve: The mitral valve is normal in structure.  No evidence of mitral valve regurgitation. No evidence of mitral valve stenosis. Tricuspid Valve: The tricuspid valve is normal in structure. Tricuspid valve regurgitation is not demonstrated. Aortic Valve: The aortic valve is tricuspid. Aortic valve regurgitation is not visualized. No aortic stenosis is present. Pulmonic Valve: The pulmonic valve was normal in structure. Pulmonic valve regurgitation is not visualized. Aorta: Aortic dilatation noted. There is mild dilatation of the ascending aorta measuring 39 mm. Venous: The inferior vena cava is normal in size with greater than 50% respiratory variability, suggesting right atrial pressure of 3 mmHg. IAS/Shunts: No atrial level shunt detected by color flow Doppler.  LEFT VENTRICLE PLAX 2D LVIDd:          5.50 cm  Diastology LVIDs:         4.40 cm  LV e' lateral:   7.40 cm/s LV PW:         1.10 cm  LV E/e' lateral: 10.9 LV IVS:        1.10 cm  LV e' medial:    7.62 cm/s LVOT diam:     2.20 cm  LV E/e' medial:  10.6 LV SV:         74 LV SV Index:   39 LVOT Area:     3.80 cm  RIGHT VENTRICLE RV Basal diam:  2.80 cm RV S prime:     17.90 cm/s TAPSE (M-mode): 2.0 cm LEFT ATRIUM             Index       RIGHT ATRIUM           Index LA diam:        3.50 cm 1.82 cm/m  RA Area:     14.20 cm LA Vol (A2C):   30.1 ml 15.69 ml/m RA Volume:   36.00 ml  18.77 ml/m LA Vol (A4C):   26.7 ml 13.92 ml/m LA Biplane Vol: 30.2 ml 15.75 ml/m  AORTIC VALVE LVOT Vmax:   127.00 cm/s LVOT Vmean:  89.250 cm/s LVOT VTI:    0.194 m  AORTA Ao Root diam: 3.40 cm Ao Asc diam:  3.90 cm MITRAL VALVE MV Area (PHT): 3.60 cm     SHUNTS MV Decel Time: 211 msec     Systemic VTI:  0.19 m MV E velocity: 80.50 cm/s   Systemic Diam: 2.20 cm MV A velocity: 130.00 cm/s MV E/A ratio:  0.62 Loralie Champagne MD Electronically signed by Loralie Champagne MD Signature Date/Time: 09/09/2019/4:52:45 PM    Final    VAS [place] UPPER EXTREMITY VENOUS DUPLEX  Result Date: 09/14/2019 UPPER VENOUS STUDY  Indications: Edema Comparison Study: No prior studies. Performing Technologist: Darlin Coco  Examination Guidelines: A complete evaluation includes B-mode imaging, spectral Doppler, color Doppler, and power Doppler as needed of all accessible portions of each vessel. Bilateral testing is considered an integral part of a complete examination. Limited examinations for reoccurring indications may be performed as noted.  Right Findings: +----------+------------+---------+-----------+----------+-------+ RIGHT     CompressiblePhasicitySpontaneousPropertiesSummary +----------+------------+---------+-----------+----------+-------+ Subclavian    Full       Yes       Yes                      +----------+------------+---------+-----------+----------+-------+  Left  Findings: +----------+------------+---------+-----------+----------+---------------------+ LEFT      CompressiblePhasicitySpontaneousProperties       Summary        +----------+------------+---------+-----------+----------+---------------------+ IJV           Full  Yes       Yes                                    +----------+------------+---------+-----------+----------+---------------------+ Subclavian    Full       Yes       Yes                                    +----------+------------+---------+-----------+----------+---------------------+ Axillary      Full       Yes       Yes                                    +----------+------------+---------+-----------+----------+---------------------+ Brachial      Full                                                        +----------+------------+---------+-----------+----------+---------------------+ Radial        Full                                                        +----------+------------+---------+-----------+----------+---------------------+ Ulnar         Full                                                        +----------+------------+---------+-----------+----------+---------------------+ Cephalic      Full                                                        +----------+------------+---------+-----------+----------+---------------------+ Basilic       Full                                    Branch of basilic                                                        non-compressible at                                                         affected area.     +----------+------------+---------+-----------+----------+---------------------+  Summary:  Right: No evidence of thrombosis in the subclavian.  Left: No evidence of deep vein thrombosis in the  upper extremity. Branch of basilic non-compressible at affected area.  *See table(s) above for measurements and observations.   Diagnosing physician: Ruta Hinds MD Electronically signed by Ruta Hinds MD on 09/14/2019 at 10:08:10 AM.    Final        Subjective: Patient seen and examined at bedside.  Denies worsening shortness of breath or chest pain.  Feels okay to be discharged.  No overnight fevers.  Discharge Exam: Vitals:   10/06/19 0748 10/06/19 0800  BP:  (!) 148/78  Pulse: (!) 103 90  Resp: 19 18  Temp:  99 F (37.2 C)  SpO2: 97% 95%    General: Pt is alert, awake, not in acute distress.  Looks chronically ill and older than stated age. Cardiovascular: rate controlled, S1/S2 + Respiratory: bilateral decreased breath sounds at bases with some scattered crackles Abdominal: Soft, NT, ND, bowel sounds + Extremities: Trace lower extremity edema; no cyanosis    The results of significant diagnostics from this hospitalization (including imaging, microbiology, ancillary and laboratory) are listed below for reference.     Microbiology: No results found for this or any previous visit (from the past 240 hour(s)).   Labs: BNP (last 3 results) Recent Labs    09/28/19 1256  BNP 818.2*   Basic Metabolic Panel: Recent Labs  Lab 10/01/19 0939  NA 139  K 4.3  CL 99  CO2 29  GLUCOSE 134*  BUN 27*  CREATININE 0.70  CALCIUM 10.1  MG 2.2  PHOS 3.8   Liver Function Tests: No results for input(s): AST, ALT, ALKPHOS, BILITOT, PROT, ALBUMIN in the last 168 hours. No results for input(s): LIPASE, AMYLASE in the last 168 hours. No results for input(s): AMMONIA in the last 168 hours. CBC: No results for input(s): WBC, NEUTROABS, HGB, HCT, MCV, PLT in the last 168 hours. Cardiac Enzymes: No results for input(s): CKTOTAL, CKMB, CKMBINDEX, TROPONINI in the last 168 hours. BNP: Invalid input(s): POCBNP CBG: No results for input(s): GLUCAP in the last 168 hours. D-Dimer No results for input(s): DDIMER in the last 72 hours. Hgb A1c No results for input(s): HGBA1C in the last 72 hours. Lipid  Profile No results for input(s): CHOL, HDL, LDLCALC, TRIG, CHOLHDL, LDLDIRECT in the last 72 hours. Thyroid function studies No results for input(s): TSH, T4TOTAL, T3FREE, THYROIDAB in the last 72 hours.  Invalid input(s): FREET3 Anemia work up No results for input(s): VITAMINB12, FOLATE, FERRITIN, TIBC, IRON, RETICCTPCT in the last 72 hours. Urinalysis    Component Value Date/Time   COLORURINE YELLOW 09/10/2019 1302   APPEARANCEUR CLEAR 09/10/2019 1302   LABSPEC 1.018 09/10/2019 1302   PHURINE 5.0 09/10/2019 1302   GLUCOSEU NEGATIVE 09/10/2019 1302   HGBUR NEGATIVE 09/10/2019 1302   BILIRUBINUR NEGATIVE 09/10/2019 1302   BILIRUBINUR n 09/23/2012 1111   KETONESUR NEGATIVE 09/10/2019 1302   PROTEINUR NEGATIVE 09/10/2019 1302   UROBILINOGEN 0.2 09/23/2012 1111   NITRITE NEGATIVE 09/10/2019 1302   LEUKOCYTESUR NEGATIVE 09/10/2019 1302   Sepsis Labs Invalid input(s): PROCALCITONIN,  WBC,  LACTICIDVEN Microbiology No results found for this or any previous visit (from the past 240 hour(s)).   Time coordinating discharge: 35 minutes  SIGNED:   Aline August, MD  [facility] 10/06/2019, 2:11 PM

## 2019-10-07 DIAGNOSIS — R5381 Other malaise: Secondary | ICD-10-CM | POA: Diagnosis not present

## 2019-10-07 DIAGNOSIS — I110 Hypertensive heart disease with heart failure: Secondary | ICD-10-CM | POA: Diagnosis not present

## 2019-10-07 DIAGNOSIS — R0600 Dyspnea, unspecified: Secondary | ICD-10-CM | POA: Diagnosis not present

## 2019-10-07 DIAGNOSIS — Z7951 Long term (current) use of inhaled steroids: Secondary | ICD-10-CM | POA: Diagnosis not present

## 2019-10-07 DIAGNOSIS — Z66 Do not resuscitate: Secondary | ICD-10-CM | POA: Diagnosis not present

## 2019-10-07 DIAGNOSIS — J9620 Acute and chronic respiratory failure, unspecified whether with hypoxia or hypercapnia: Secondary | ICD-10-CM | POA: Diagnosis not present

## 2019-10-07 DIAGNOSIS — M6281 Muscle weakness (generalized): Secondary | ICD-10-CM | POA: Diagnosis not present

## 2019-10-07 DIAGNOSIS — R0602 Shortness of breath: Secondary | ICD-10-CM | POA: Diagnosis not present

## 2019-10-07 DIAGNOSIS — E785 Hyperlipidemia, unspecified: Secondary | ICD-10-CM | POA: Diagnosis not present

## 2019-10-07 DIAGNOSIS — Z515 Encounter for palliative care: Secondary | ICD-10-CM | POA: Diagnosis not present

## 2019-10-07 DIAGNOSIS — Z825 Family history of asthma and other chronic lower respiratory diseases: Secondary | ICD-10-CM | POA: Diagnosis not present

## 2019-10-07 DIAGNOSIS — Z87891 Personal history of nicotine dependence: Secondary | ICD-10-CM | POA: Diagnosis not present

## 2019-10-07 DIAGNOSIS — F319 Bipolar disorder, unspecified: Secondary | ICD-10-CM | POA: Diagnosis not present

## 2019-10-07 DIAGNOSIS — Z20822 Contact with and (suspected) exposure to covid-19: Secondary | ICD-10-CM | POA: Diagnosis not present

## 2019-10-07 DIAGNOSIS — Z882 Allergy status to sulfonamides status: Secondary | ICD-10-CM | POA: Diagnosis not present

## 2019-10-07 DIAGNOSIS — K219 Gastro-esophageal reflux disease without esophagitis: Secondary | ICD-10-CM | POA: Diagnosis not present

## 2019-10-07 DIAGNOSIS — R279 Unspecified lack of coordination: Secondary | ICD-10-CM | POA: Diagnosis not present

## 2019-10-07 DIAGNOSIS — J9621 Acute and chronic respiratory failure with hypoxia: Secondary | ICD-10-CM | POA: Diagnosis not present

## 2019-10-07 DIAGNOSIS — G8929 Other chronic pain: Secondary | ICD-10-CM | POA: Diagnosis not present

## 2019-10-07 DIAGNOSIS — L89312 Pressure ulcer of right buttock, stage 2: Secondary | ICD-10-CM | POA: Diagnosis not present

## 2019-10-07 DIAGNOSIS — Z743 Need for continuous supervision: Secondary | ICD-10-CM | POA: Diagnosis not present

## 2019-10-07 DIAGNOSIS — Z79899 Other long term (current) drug therapy: Secondary | ICD-10-CM | POA: Diagnosis not present

## 2019-10-07 DIAGNOSIS — F3170 Bipolar disorder, currently in remission, most recent episode unspecified: Secondary | ICD-10-CM | POA: Diagnosis not present

## 2019-10-07 DIAGNOSIS — Z9981 Dependence on supplemental oxygen: Secondary | ICD-10-CM | POA: Diagnosis not present

## 2019-10-07 DIAGNOSIS — U071 COVID-19: Secondary | ICD-10-CM | POA: Diagnosis not present

## 2019-10-07 DIAGNOSIS — R053 Chronic cough: Secondary | ICD-10-CM | POA: Diagnosis not present

## 2019-10-07 DIAGNOSIS — M549 Dorsalgia, unspecified: Secondary | ICD-10-CM | POA: Diagnosis not present

## 2019-10-07 DIAGNOSIS — J441 Chronic obstructive pulmonary disease with (acute) exacerbation: Secondary | ICD-10-CM | POA: Diagnosis not present

## 2019-10-07 DIAGNOSIS — Z23 Encounter for immunization: Secondary | ICD-10-CM | POA: Diagnosis not present

## 2019-10-07 DIAGNOSIS — L89322 Pressure ulcer of left buttock, stage 2: Secondary | ICD-10-CM | POA: Diagnosis not present

## 2019-10-07 DIAGNOSIS — Z801 Family history of malignant neoplasm of trachea, bronchus and lung: Secondary | ICD-10-CM | POA: Diagnosis not present

## 2019-10-07 DIAGNOSIS — F411 Generalized anxiety disorder: Secondary | ICD-10-CM | POA: Diagnosis not present

## 2019-10-07 DIAGNOSIS — F339 Major depressive disorder, recurrent, unspecified: Secondary | ICD-10-CM | POA: Diagnosis not present

## 2019-10-07 DIAGNOSIS — I5032 Chronic diastolic (congestive) heart failure: Secondary | ICD-10-CM | POA: Diagnosis not present

## 2019-10-07 DIAGNOSIS — R069 Unspecified abnormalities of breathing: Secondary | ICD-10-CM | POA: Diagnosis not present

## 2019-10-07 DIAGNOSIS — M62838 Other muscle spasm: Secondary | ICD-10-CM | POA: Diagnosis not present

## 2019-10-07 DIAGNOSIS — J449 Chronic obstructive pulmonary disease, unspecified: Secondary | ICD-10-CM | POA: Diagnosis not present

## 2019-10-07 DIAGNOSIS — F419 Anxiety disorder, unspecified: Secondary | ICD-10-CM | POA: Diagnosis not present

## 2019-10-07 DIAGNOSIS — R52 Pain, unspecified: Secondary | ICD-10-CM | POA: Diagnosis not present

## 2019-10-07 DIAGNOSIS — J9600 Acute respiratory failure, unspecified whether with hypoxia or hypercapnia: Secondary | ICD-10-CM | POA: Diagnosis not present

## 2019-10-07 DIAGNOSIS — F410 Panic disorder [episodic paroxysmal anxiety] without agoraphobia: Secondary | ICD-10-CM | POA: Diagnosis not present

## 2019-10-07 DIAGNOSIS — J9602 Acute respiratory failure with hypercapnia: Secondary | ICD-10-CM | POA: Diagnosis not present

## 2019-10-07 DIAGNOSIS — J9601 Acute respiratory failure with hypoxia: Secondary | ICD-10-CM | POA: Diagnosis not present

## 2019-10-07 LAB — RESPIRATORY PANEL BY RT PCR (FLU A&B, COVID)
Influenza A by PCR: NEGATIVE
Influenza B by PCR: NEGATIVE
SARS Coronavirus 2 by RT PCR: NEGATIVE

## 2019-10-07 MED ORDER — PREDNISONE 20 MG PO TABS: 40.0000 mg | ORAL_TABLET | Freq: Every day | ORAL | 0 refills | Status: AC

## 2019-10-07 MED ORDER — ALPRAZOLAM 0.25 MG PO TABS: 0.2500 mg | ORAL_TABLET | Freq: Every day | ORAL | 0 refills | Status: DC | PRN

## 2019-10-07 MED ORDER — HYDROCODONE-ACETAMINOPHEN 5-325 MG PO TABS
1.0000 | ORAL_TABLET | Freq: Three times a day (TID) | ORAL | 0 refills | Status: DC | PRN
Start: 2019-10-07 — End: 2019-11-10

## 2019-10-07 NOTE — TOC Progression Note (Addendum)
Transition of Care [facility]) - Progression Note    Patient Details  Name: TANGY DROZDOWSKI MRN: [id number] Date of Birth: [birth year]  Transition of Care [facility]) CM/SW [place], [place] Phone Number: 10/07/2019, 12:01 PM  Clinical Narrative:     Update 12:33pm 9/28- CSW just spoke with Sterling from [facility]. They received insurance authorization for patient. Juliann Pulse said [place] does not need to wait for PASSR number to populate since interview has been completed. Juliann Pulse told CSW to go ahead and send patient over. CSW informed MD.  CSW will continue to follow.  Patient completed PASSR interview with Pamala Hurry from [place] must. CSW awaiting PASSR number to be approved. CSW spoke with Juliann Pulse with [facility] who confirmed that insurance is still pending. Patient has SNF bed at [facility].  CSW will continue to follow.  Expected Discharge Plan: [place] Barriers to Discharge: Continued Medical Work up  Expected Discharge Plan and Services Expected Discharge Plan: [place] arrangements for the past 2 months: Single Family Home                                       Social Determinants of Health (SDOH) Interventions    Readmission Risk Interventions No flowsheet data found.

## 2019-10-07 NOTE — TOC Transition Note (Signed)
Transition of Care [facility]) - CM/SW Discharge Note   Patient Details  Name: Julia Daniels MRN: [id number] Date of Birth: February 01, 1950  Transition of Care [facility]) CM/SW Contact:  Trula Ore, [place] Phone Number: 10/07/2019, 1:54 PM   Clinical Narrative:     Patient will DC to: [place]  Anticipated DC date: 10/07/2019  Family notified: [name]  Transport by: Corey Harold  ?  Per MD patient ready for DC to [facility] with outpatient palliative services to follow. RN, patient, patient's family, Melissa with [facility],and facility notified of DC. Discharge Summary sent to facility. RN given number for report tele# [phone] RM#[phone]. DC packet on chart. DNR signed on chart.Ambulance transport requested for patient.  CSW signing off.   Final next level of care: Skilled Nursing Facility Barriers to Discharge: No Barriers Identified   Patient Goals and CMS Choice Patient states their goals for this hospitalization and ongoing recovery are:: to go to SNF CMS Medicare.gov Compare Post Acute Care list provided to:: Patient Choice offered to / list presented to : Patient  Discharge Placement              Patient chooses bed at: [facility] Patient to be transferred to facility by: [place] Name of family member notified: [name] Patient and family notified of of transfer: 10/07/19  Discharge Plan and Services                                     Social Determinants of Health (SDOH) Interventions     Readmission Risk Interventions No flowsheet data found.

## 2019-10-07 NOTE — Progress Notes (Signed)
Patient ID: Julia Daniels, female   DOB: December 17, 1950, 69 y.o.   MRN: [id number]  PROGRESS NOTE    Julia Daniels  GGE:[id number] DOB: 01-11-1950 DOA: 09/28/2019 PCP: Simona Huh, NP   Brief Narrative:  69 year old female with history of COPD/asthma on intermittent oxygen at home, anxiety disorder/bipolar disorder, GERD, recent recurrent admissions for COPD exacerbation/respiratory failure was admitted from inpatient rehab with severe shortness of breath on 09/28/2019.  During the hospitalization, she was transferred to ICU because of worsening hypoxia and required BiPAP.  She did not wish to be intubated and she was changed to DNR status.  She also had severe anxiety for which she had to be placed on Precedex drip which was subsequently discontinued.  She has been transferred back to [facility] service on 10/03/2019.  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  She has been made a DNR and intermittently tolerated BiPAP in the ICU - Currently on 2 L oxygen via nasal cannula -Treated with tapering doses of IV Solu-Medrol.  Switch to oral prednisone from today.  Continue nebs. -Patient has had multiple admissions for COPD exacerbation recently and worsening anxiety has played a big factor in the same. -Palliative care consultation evaluation appreciated: Her overall prognosis seems poor with recurrent hospitalizations.  If condition continues to worsen, consider hospice -Continue montelukast  Anxiety/bipolar disorder Chronic pain -Off Precedex drip.  Continue risperidone, quetiapine, lamotrigine, methylphenidate, baclofen, venlafaxine and as needed Ativan  Generalized deconditioning -PT recommends SNF placement.  Social worker following. -Will need outpatient palliative care follow-up.  Obesity -Outpatient  follow-up  DVT prophylaxis: Lovenox Code Status: DNR Family Communication: None at bedside Disposition Plan: Status is: Inpatient  Remains inpatient appropriate because:Inpatient level of care appropriate due to severity of illness.  Discharge to SNF as soon as bed is available.   Dispo: The patient is from: CIR              Anticipated d/c is to: SNF              Anticipated d/c date is: 1 day              Patient currently is medically stable to d/c.   Consultants: Palliative care/PCCM  Procedures: None  Antimicrobials:  Anti-infectives (From admission, onward)   None       Subjective: Patient seen and examined at bedside.  No worsening shortness of breath or chest pain.  Feels better and thinks that she is ready for discharge to SNF.  No overnight fever Objective: Vitals:   10/06/19 1934 10/06/19 2102 10/07/19 0610 10/07/19 0800  BP:  (!) 143/83 113/66   Pulse:  86 75   Resp:  16 17   Temp:  98.6 F (37 C) 98.1 F (36.7 C)   TempSrc:  Oral Axillary   SpO2: 98% 98% 93% 95%  Weight:   90.6 kg   Height:        Intake/Output Summary (Last 24 hours) at 10/07/2019 0955 Last data filed at 10/07/2019 4650 Gross per 24 hour  Intake 1482 ml  Output 2700 ml  Net -1218 ml   Filed Weights   10/04/19 0521 10/05/19 0502 10/07/19 0610  Weight: 91.9 kg 93.4 kg 90.6 kg    Examination:  General exam: No distress.  Chronically ill looking.  Looks older than stated age.  Currently still  on 2 L oxygen by nasal cannula.  Poor historian. Respiratory system: Bilateral decreased breath sounds at bases with some scattered crackles  cardiovascular system: Rate controlled, S1-S2 heard Gastrointestinal system: Abdomen is obese, nondistended, soft and nontender.  Normal bowel sounds heard  extremities: No cyanosis.  Mild lower extremity edema present   Data Reviewed: I have personally reviewed following labs and imaging studies  CBC: No results for input(s): WBC, NEUTROABS,  HGB, HCT, MCV, PLT in the last 168 hours. Basic Metabolic Panel: Recent Labs  Lab 10/01/19 0939  NA 139  K 4.3  CL 99  CO2 29  GLUCOSE 134*  BUN 27*  CREATININE 0.70  CALCIUM 10.1  MG 2.2  PHOS 3.8   GFR: Estimated Creatinine Clearance: 73.9 mL/min (by C-G formula based on SCr of 0.7 mg/dL). Liver Function Tests: No results for input(s): AST, ALT, ALKPHOS, BILITOT, PROT, ALBUMIN in the last 168 hours. No results for input(s): LIPASE, AMYLASE in the last 168 hours. No results for input(s): AMMONIA in the last 168 hours. Coagulation Profile: No results for input(s): INR, PROTIME in the last 168 hours. Cardiac Enzymes: No results for input(s): CKTOTAL, CKMB, CKMBINDEX, TROPONINI in the last 168 hours. BNP (last 3 results) No results for input(s): PROBNP in the last 8760 hours. HbA1C: No results for input(s): HGBA1C in the last 72 hours. CBG: No results for input(s): GLUCAP in the last 168 hours. Lipid Profile: No results for input(s): CHOL, HDL, LDLCALC, TRIG, CHOLHDL, LDLDIRECT in the last 72 hours. Thyroid Function Tests: No results for input(s): TSH, T4TOTAL, FREET4, T3FREE, THYROIDAB in the last 72 hours. Anemia Panel: No results for input(s): VITAMINB12, FOLATE, FERRITIN, TIBC, IRON, RETICCTPCT in the last 72 hours. Sepsis Labs: No results for input(s): PROCALCITON, LATICACIDVEN in the last 168 hours.  Recent Results (from the past 240 hour(s))  Respiratory Panel by RT PCR (Flu A&B, Covid) - Nasopharyngeal Swab     Status: None   Collection Time: 10/06/19  8:19 AM   Specimen: Nasopharyngeal Swab  Result Value Ref Range Status   SARS Coronavirus 2 by RT PCR NEGATIVE NEGATIVE Final    Comment: (NOTE) SARS-CoV-2 target nucleic acids are NOT DETECTED.  The SARS-CoV-2 RNA is generally detectable in upper respiratoy specimens during the acute phase of infection. The lowest concentration of SARS-CoV-2 viral copies this assay can detect is 131 copies/mL. A negative  result does not preclude SARS-Cov-2 infection and should not be used as the sole basis for treatment or other patient management decisions. A negative result may occur with  improper specimen collection/handling, submission of specimen other than nasopharyngeal swab, presence of viral mutation(s) within the areas targeted by this assay, and inadequate number of viral copies (<131 copies/mL). A negative result must be combined with clinical observations, patient history, and epidemiological information. The expected result is Negative.  Fact Sheet for Patients:  PinkCheek.be  Fact Sheet for Healthcare Providers:  GravelBags.it  This test is no t yet approved or cleared by the [place] FDA and  has been authorized for detection and/or diagnosis of SARS-CoV-2 by FDA under an Emergency Use Authorization (EUA). This EUA will remain  in effect (meaning this test can be used) for the duration of the COVID-19 declaration under Section 564(b)(1) of the Act, 21 U.S.C. section 360bbb-3(b)(1), unless the authorization is terminated or revoked sooner.     Influenza A by PCR NEGATIVE NEGATIVE Final   Influenza B by PCR NEGATIVE NEGATIVE Final    Comment: (NOTE) The Xpert  Xpress SARS-CoV-2/FLU/RSV assay is intended as an aid in  the diagnosis of influenza from Nasopharyngeal swab specimens and  should not be used as a sole basis for treatment. Nasal washings and  aspirates are unacceptable for Xpert Xpress SARS-CoV-2/FLU/RSV  testing.  Fact Sheet for Patients: PinkCheek.be  Fact Sheet for Healthcare Providers: GravelBags.it  This test is not yet approved or cleared by the [place] FDA and  has been authorized for detection and/or diagnosis of SARS-CoV-2 by  FDA under an Emergency Use Authorization (EUA). This EUA will remain  in effect (meaning this test can be used)  for the duration of the  Covid-19 declaration under Section 564(b)(1) of the Act, 21  U.S.C. section 360bbb-3(b)(1), unless the authorization is  terminated or revoked. Performed at [facility], [place] [address] [place], [place] [address]          Radiology Studies: No results found.      Scheduled Meds: . ALPRAZolam  0.25 mg Oral TID  . arformoterol  15 mcg Nebulization BID  . atorvastatin  40 mg Oral Daily  . baclofen  10 mg Oral TID  . budesonide (PULMICORT) nebulizer solution  0.5 mg Nebulization BID  . chlorhexidine  15 mL Mouth Rinse BID  . Chlorhexidine Gluconate Cloth  6 each Topical Daily  . docusate sodium  100 mg Oral BID  . enoxaparin (LOVENOX) injection  40 mg Subcutaneous Q24H  . famotidine  20 mg Oral QHS  . guaiFENesin  600 mg Oral BID  . lamoTRIgine  200 mg Oral QHS  . lidocaine  1 patch Transdermal Q24H  . loratadine  10 mg Oral QPM  . mouth rinse  15 mL Mouth Rinse q12n4p  . methylphenidate  72 mg Oral BH-q7a  . montelukast  10 mg Oral QHS  .  morphine injection  2 mg Intravenous Once  . pantoprazole  40 mg Oral Daily  . predniSONE  40 mg Oral Q breakfast  . QUEtiapine  150 mg Oral QHS  . revefenacin  175 mcg Nebulization Daily  . risperiDONE  3 mg Oral QHS  . senna-docusate  1 tablet Oral BID  . venlafaxine XR  150 mg Oral QHS   Continuous Infusions:        Aline August, MD [facility] 10/07/2019, 9:55 AM

## 2019-10-07 NOTE — Progress Notes (Signed)
Report given Ty Therapist, sports at [employer].  Patient ready for transfer.  Awaiting PTAR for transport.

## 2019-10-09 DIAGNOSIS — J9621 Acute and chronic respiratory failure with hypoxia: Secondary | ICD-10-CM | POA: Diagnosis not present

## 2019-10-09 DIAGNOSIS — F3170 Bipolar disorder, currently in remission, most recent episode unspecified: Secondary | ICD-10-CM | POA: Diagnosis not present

## 2019-10-09 DIAGNOSIS — R5381 Other malaise: Secondary | ICD-10-CM | POA: Diagnosis not present

## 2019-10-09 DIAGNOSIS — J441 Chronic obstructive pulmonary disease with (acute) exacerbation: Secondary | ICD-10-CM | POA: Diagnosis not present

## 2019-10-13 ENCOUNTER — Ambulatory Visit: Payer: BC Managed Care – PPO

## 2019-10-16 DIAGNOSIS — F410 Panic disorder [episodic paroxysmal anxiety] without agoraphobia: Secondary | ICD-10-CM | POA: Diagnosis not present

## 2019-10-16 DIAGNOSIS — M62838 Other muscle spasm: Secondary | ICD-10-CM | POA: Diagnosis not present

## 2019-10-16 DIAGNOSIS — F411 Generalized anxiety disorder: Secondary | ICD-10-CM | POA: Diagnosis not present

## 2019-10-16 DIAGNOSIS — J441 Chronic obstructive pulmonary disease with (acute) exacerbation: Secondary | ICD-10-CM | POA: Diagnosis not present

## 2019-10-17 ENCOUNTER — Non-Acute Institutional Stay: Payer: BC Managed Care – PPO | Admitting: Hospice

## 2019-10-17 ENCOUNTER — Other Ambulatory Visit: Payer: Self-pay

## 2019-10-17 DIAGNOSIS — J449 Chronic obstructive pulmonary disease, unspecified: Secondary | ICD-10-CM | POA: Diagnosis not present

## 2019-10-17 DIAGNOSIS — R053 Chronic cough: Secondary | ICD-10-CM | POA: Diagnosis not present

## 2019-10-17 DIAGNOSIS — Z515 Encounter for palliative care: Secondary | ICD-10-CM

## 2019-10-17 DIAGNOSIS — J441 Chronic obstructive pulmonary disease with (acute) exacerbation: Secondary | ICD-10-CM

## 2019-10-17 DIAGNOSIS — F410 Panic disorder [episodic paroxysmal anxiety] without agoraphobia: Secondary | ICD-10-CM | POA: Diagnosis not present

## 2019-10-17 DIAGNOSIS — F411 Generalized anxiety disorder: Secondary | ICD-10-CM | POA: Diagnosis not present

## 2019-10-17 NOTE — Progress Notes (Signed)
[place] Consult Note Telephone: [phone]  Fax: [phone]  PATIENT NAME: Julia Daniels [address] [place] [address] [phone] (home)  DOB: 1950-11-20 MRN: [id number]  PRIMARY CARE PROVIDER:    Simona Huh, [place],  [place] [address] [phone]  REFERRING PROVIDER:  Martinique Blattenberger NP   RESPONSIBLE PARTY:  Self [phone]  Extended Emergency Contact Information Primary Emergency Contact: Tinnie Gens [place] Phone: [phone] Relation: Son  I met face to face with patient and family in home/facility.  RECOMMENDATIONS/PLAN:    Visit at the request of Martinique Blattenberger NP  for palliative consult. Visit consisted of building trust and discussions on Palliative Medicine as specialized medical care for people living with serious illness, aimed at facilitating better quality of life through symptoms relief, assisting with advance care plan and establishing goals of care.   Discussion on the difference between Palliative and Hospice care. Palliative care and hospice have similar goals of managing symptoms, promoting comfort, improving quality of life, and maintaining a person's dignity. However, palliative care may be offered during any phase of a serious illness, while hospice care is usually offered when a person is expected to live for 6 months or less. Patient said she will like to be followed by Palliative care when is discharged home and she is interested in Hospice service in the future. [facility]  information provided to patient  Advance Care Planning: Our advance care planning conversation included a discussion about:    The value and importance of advance care planning  Experiences with loved ones who have been seriously ill or have died  Exploration of personal, cultural or spiritual beliefs that might influence medical decisions  Exploration of  goals of care in the event of a sudden injury or illness  Identification and preparation of a healthcare agent  Review and updating or creation of an  advance directive document  CODE STATUS: Patient affirmed she is a  DO NOT RESUSCITATE. She has a signed DNR form in the facility.  GOALS OF CARE: Goals of care include to maximize quality of life and symptom management.Patient wishes to go back home and be able to carry on with her activities of daily living.  Patient has a signed MOST form in facility records; selections include DO NOT RESUSCITATE, limited additional intervention, antibiotics if indicated, IV fluids indicated, feeding tube for defined trial.   Follow up Palliative Care Visit: Palliative care will continue to follow for goals of care clarification and symptom management. Follow up in 2 months.   Functional decline/Symptom Management:  Frequent ED admissions ( four admissions) in the past 2 months related to COPD exacerbation. Last admission 09/28/2019 to 10/07/2019 for COPD exacerbation/respiratory failure with hypoxia.  She was transferred to ICU because of worsening hypoxia and required BiPAP.  She refused intubation.  Stabilized, patient was discharged to SNF on oral prednisone 40 mg daily x7 days for rehab. Today, patient endorsed congestion,shortness of breath on mild exertion; able to hold conversation during visit without shortness of breath.  She received her nebulized breathing treatments; helpful.  Chest x-ray done this morning, results pending. Nursing monitoring forany medical acuity and call provider per protocol. Patient is on Risperdal, Effexor, Seroquel, alprazolam for bipolar last/depression/anxiety.  She has baclofen and Norco as needed for pain PT OT is ongoing; patient ambulatory with rolling walker for support.  She is on oxygen 3 L/min.  She is compliant  with her medications.   Nursing staff with no concerns.  Encouraged ongoing care. Palliative will continue to  monitor for symptom management/decline and make recommendations as needed.   Family /Caregiver/Community Supports: Patient was living at home with her son who is involved in her care.  She is currently in SNF for rehab  I spent 1 hour and 30 minutes providing this initial consultation; time includes time spent with patient/family, chart review, provider coordination,  and documentation. More than 50% of the time in this consultation was spent on coordinating communication  CHIEF COMPLAIN/HISTORY OF PRESENT ILLNESS:  Julia Daniels is a 69 y.o. female with multiple medical problems including COPD exacerbation/hypercarbic respiratory failure, anxiety/bipolar disorder, history of low back pain.  Palliative Care was asked to follow this patient by consultation request of Martinique Blattenberger NPto help address advance care planning and goals of care.  CODE STATUS: DNR  PPS: 50%   HOSPICE ELIGIBILITY/DIAGNOSIS: TBD  PAST MEDICAL HISTORY:  Past Medical History:  Diagnosis Date  . Anxiety   . Asthma   . COPD (chronic obstructive pulmonary disease) ([place])   . Depression     SOCIAL HX:  Social History   Tobacco Use  . Smoking status: Former Smoker    Packs/day: 1.00    Years: 40.00    Pack years: 40.00    Types: Cigarettes    Quit date: 08/23/2017    Years since quitting: 2.1  . Smokeless tobacco: Never Used  Substance Use Topics  . Alcohol use: No   FAMILY HX:  Family History  Problem Relation Age of Onset  . Asthma Mother   . Lung cancer Father   . COPD Father     ALLERGIES:  Allergies  Allergen Reactions  . Sulfa Antibiotics Other (See Comments)    Mouth gets raw     PERTINENT MEDICATIONS:  Outpatient Encounter Medications as of 10/17/2019  Medication Sig  . albuterol (PROAIR HFA) 108 (90 Base) MCG/ACT inhaler Inhale 2 puffs into the lungs every 6 (six) hours as needed for wheezing or shortness of breath.  Marland Kitchen albuterol (PROVENTIL) (2.5 MG/3ML) 0.083% nebulizer  solution Take 3 mLs (2.5 mg total) by nebulization every 6 (six) hours as needed for wheezing or shortness of breath.  . ALPRAZolam (XANAX) 0.25 MG tablet Take 1 tablet (0.25 mg total) by mouth daily as needed for anxiety.  Marland Kitchen atorvastatin (LIPITOR) 40 MG tablet Take 1 tablet (40 mg total) by mouth daily.  . baclofen (LIORESAL) 10 MG tablet Take 10 mg by mouth 3 (three) times daily as needed.  . budesonide-formoterol (SYMBICORT) 160-4.5 MCG/ACT inhaler INHALE 2 PUFFS INTO LUNGS TWICE A DAY (Patient taking differently: Inhale 2 puffs into the lungs in the morning and at bedtime. )  . EPINEPHrine 0.3 mg/0.3 mL IJ SOAJ injection Inject 0.3 mLs (0.3 mg total) into the muscle as needed for anaphylaxis.  . famotidine (PEPCID) 20 MG tablet One at bedtime (Patient taking differently: Take 20 mg by mouth at bedtime. )  . FASENRA 30 MG/ML SOSY INJECT 1 SYRINGE UNDER THE SKIN EVERY 8 WEEKS. (Patient taking differently: Inject 30 mg into the skin every 8 (eight) weeks. )  . guaiFENesin (MUCINEX) 600 MG 12 hr tablet Take 1 tablet (600 mg total) by mouth 2 (two) times daily.  Marland Kitchen HYDROcodone-acetaminophen (NORCO/VICODIN) 5-325 MG tablet Take 1 tablet by mouth 3 (three) times daily as needed for moderate pain.  Marland Kitchen ibuprofen (ADVIL) 800 MG tablet Take 800 mg by mouth 2 (two) times  daily as needed for pain.  Marland Kitchen lamoTRIgine (LAMICTAL) 200 MG tablet Take 200 mg by mouth at bedtime.   . lidocaine (LIDODERM) 5 % Place 1 patch onto the skin daily. Remove & Discard patch within 12 hours or as directed by MD  . loratadine (CLARITIN) 10 MG tablet Take 10 mg by mouth every evening.   . methylphenidate 18 MG PO CR tablet Take 18 mg by mouth daily. Take with 54 mg tablet= 58m  . methylphenidate 54 MG PO CR tablet Take 54 mg by mouth every morning. Take with 18 mg tablet= 747m . montelukast (SINGULAIR) 10 MG tablet Take 1 tablet (10 mg total) by mouth at bedtime.  . pantoprazole (PROTONIX) 40 MG tablet Take 1 tablet (40 mg total)  by mouth daily. Take 30-60 min before first meal of the day  . QUEtiapine (SEROQUEL) 100 MG tablet Take 150 mg by mouth at bedtime.  . risperiDONE (RISPERDAL) 1 MG tablet Take 3 mg by mouth at bedtime.   . senna-docusate (SENOKOT-S) 8.6-50 MG tablet Take 1 tablet by mouth 2 (two) times daily.  . Marland Kitchenpacer/Aero-Holding Chambers (AEROCHAMBER MV) inhaler Use as instructed  . Tiotropium Bromide Monohydrate (SPIRIVA RESPIMAT) 2.5 MCG/ACT AERS Inhale 2 puffs into the lungs daily.  . Marland Kitchenenlafaxine XR (EFFEXOR-XR) 150 MG 24 hr capsule Take 150 mg by mouth at bedtime.   . Vitamin D, Ergocalciferol, (DRISDOL) 50000 units CAPS capsule Take 50,000 Units by mouth every Saturday.    No facility-administered encounter medications on file as of 10/17/2019.    PHYSICAL EXAM / ROS: Height: 5 feet 4 inches     weight: 205 pounds General: In no acute distress, cooperative Cardiovascular: regular rate and rhythm, no chest pain reported Pulmonary: Rhonchorous sounds in all lobes; on oxygen supplementation 3 L/min.  Abdomen: soft, non tender, positive bowel sounds in all quadrants GU: denies dysuria, no suprapubic tenderness Extremities: no edema, no joint deformities Skin: no rashes to exposed skin Neurological: Weakness but otherwise non focal; alert and oriented x3  LiTeodoro SprayNP

## 2019-10-21 ENCOUNTER — Emergency Department (HOSPITAL_COMMUNITY): Payer: BC Managed Care – PPO

## 2019-10-21 ENCOUNTER — Inpatient Hospital Stay (HOSPITAL_COMMUNITY)
Admission: EM | Admit: 2019-10-21 | Discharge: 2019-10-24 | DRG: 190 | Disposition: A | Discharge status: Home / Self Care | Payer: BC Managed Care – PPO | Attending: Internal Medicine | Admitting: Internal Medicine

## 2019-10-21 ENCOUNTER — Encounter (HOSPITAL_COMMUNITY): Payer: Self-pay | Admitting: Emergency Medicine

## 2019-10-21 DIAGNOSIS — J9621 Acute and chronic respiratory failure with hypoxia: Secondary | ICD-10-CM | POA: Diagnosis not present

## 2019-10-21 DIAGNOSIS — Z882 Allergy status to sulfonamides status: Secondary | ICD-10-CM | POA: Diagnosis not present

## 2019-10-21 DIAGNOSIS — F988 Other specified behavioral and emotional disorders with onset usually occurring in childhood and adolescence: Secondary | ICD-10-CM | POA: Diagnosis not present

## 2019-10-21 DIAGNOSIS — J441 Chronic obstructive pulmonary disease with (acute) exacerbation: Secondary | ICD-10-CM | POA: Diagnosis not present

## 2019-10-21 DIAGNOSIS — I110 Hypertensive heart disease with heart failure: Secondary | ICD-10-CM | POA: Diagnosis not present

## 2019-10-21 DIAGNOSIS — R0602 Shortness of breath: Secondary | ICD-10-CM | POA: Diagnosis not present

## 2019-10-21 DIAGNOSIS — J45901 Unspecified asthma with (acute) exacerbation: Secondary | ICD-10-CM | POA: Diagnosis not present

## 2019-10-21 DIAGNOSIS — I10 Essential (primary) hypertension: Secondary | ICD-10-CM | POA: Diagnosis not present

## 2019-10-21 DIAGNOSIS — Z7951 Long term (current) use of inhaled steroids: Secondary | ICD-10-CM | POA: Diagnosis not present

## 2019-10-21 DIAGNOSIS — Z7401 Bed confinement status: Secondary | ICD-10-CM | POA: Diagnosis not present

## 2019-10-21 DIAGNOSIS — R52 Pain, unspecified: Secondary | ICD-10-CM | POA: Diagnosis not present

## 2019-10-21 DIAGNOSIS — Z79899 Other long term (current) drug therapy: Secondary | ICD-10-CM

## 2019-10-21 DIAGNOSIS — J449 Chronic obstructive pulmonary disease, unspecified: Secondary | ICD-10-CM | POA: Diagnosis not present

## 2019-10-21 DIAGNOSIS — M6281 Muscle weakness (generalized): Secondary | ICD-10-CM | POA: Diagnosis not present

## 2019-10-21 DIAGNOSIS — U071 COVID-19: Secondary | ICD-10-CM | POA: Diagnosis not present

## 2019-10-21 DIAGNOSIS — R0600 Dyspnea, unspecified: Secondary | ICD-10-CM | POA: Diagnosis not present

## 2019-10-21 DIAGNOSIS — F419 Anxiety disorder, unspecified: Secondary | ICD-10-CM | POA: Diagnosis present

## 2019-10-21 DIAGNOSIS — Z23 Encounter for immunization: Secondary | ICD-10-CM | POA: Diagnosis not present

## 2019-10-21 DIAGNOSIS — Z20822 Contact with and (suspected) exposure to covid-19: Secondary | ICD-10-CM | POA: Diagnosis present

## 2019-10-21 DIAGNOSIS — Z801 Family history of malignant neoplasm of trachea, bronchus and lung: Secondary | ICD-10-CM

## 2019-10-21 DIAGNOSIS — R2243 Localized swelling, mass and lump, lower limb, bilateral: Secondary | ICD-10-CM | POA: Diagnosis not present

## 2019-10-21 DIAGNOSIS — I5032 Chronic diastolic (congestive) heart failure: Secondary | ICD-10-CM | POA: Diagnosis present

## 2019-10-21 DIAGNOSIS — K219 Gastro-esophageal reflux disease without esophagitis: Secondary | ICD-10-CM | POA: Diagnosis present

## 2019-10-21 DIAGNOSIS — J9620 Acute and chronic respiratory failure, unspecified whether with hypoxia or hypercapnia: Secondary | ICD-10-CM | POA: Diagnosis not present

## 2019-10-21 DIAGNOSIS — J9602 Acute respiratory failure with hypercapnia: Secondary | ICD-10-CM | POA: Diagnosis not present

## 2019-10-21 DIAGNOSIS — Z825 Family history of asthma and other chronic lower respiratory diseases: Secondary | ICD-10-CM

## 2019-10-21 DIAGNOSIS — E785 Hyperlipidemia, unspecified: Secondary | ICD-10-CM | POA: Diagnosis present

## 2019-10-21 DIAGNOSIS — Z66 Do not resuscitate: Secondary | ICD-10-CM | POA: Diagnosis not present

## 2019-10-21 DIAGNOSIS — L89322 Pressure ulcer of left buttock, stage 2: Secondary | ICD-10-CM | POA: Diagnosis present

## 2019-10-21 DIAGNOSIS — M549 Dorsalgia, unspecified: Secondary | ICD-10-CM | POA: Diagnosis not present

## 2019-10-21 DIAGNOSIS — R069 Unspecified abnormalities of breathing: Secondary | ICD-10-CM | POA: Diagnosis not present

## 2019-10-21 DIAGNOSIS — G8929 Other chronic pain: Secondary | ICD-10-CM | POA: Diagnosis present

## 2019-10-21 DIAGNOSIS — M2550 Pain in unspecified joint: Secondary | ICD-10-CM | POA: Diagnosis not present

## 2019-10-21 DIAGNOSIS — L89312 Pressure ulcer of right buttock, stage 2: Secondary | ICD-10-CM | POA: Diagnosis not present

## 2019-10-21 DIAGNOSIS — Z87891 Personal history of nicotine dependence: Secondary | ICD-10-CM | POA: Diagnosis not present

## 2019-10-21 DIAGNOSIS — Z9981 Dependence on supplemental oxygen: Secondary | ICD-10-CM | POA: Diagnosis not present

## 2019-10-21 DIAGNOSIS — J9601 Acute respiratory failure with hypoxia: Secondary | ICD-10-CM | POA: Diagnosis not present

## 2019-10-21 DIAGNOSIS — R5381 Other malaise: Secondary | ICD-10-CM | POA: Diagnosis not present

## 2019-10-21 DIAGNOSIS — L8990 Pressure ulcer of unspecified site, unspecified stage: Secondary | ICD-10-CM | POA: Insufficient documentation

## 2019-10-21 DIAGNOSIS — F339 Major depressive disorder, recurrent, unspecified: Secondary | ICD-10-CM | POA: Diagnosis not present

## 2019-10-21 DIAGNOSIS — F319 Bipolar disorder, unspecified: Secondary | ICD-10-CM | POA: Diagnosis not present

## 2019-10-21 LAB — BLOOD GAS, ARTERIAL
Acid-Base Excess: 6.9 mmol/L — ABNORMAL HIGH (ref 0.0–2.0)
Bicarbonate: 32.9 mmol/L — ABNORMAL HIGH (ref 20.0–28.0)
O2 Saturation: 96.4 %
Patient temperature: 98.5
pCO2 arterial: 56.2 mmHg — ABNORMAL HIGH (ref 32.0–48.0)
pH, Arterial: 7.385 (ref 7.350–7.450)
pO2, Arterial: 79.4 mmHg — ABNORMAL LOW (ref 83.0–108.0)

## 2019-10-21 LAB — CBC WITH DIFFERENTIAL/PLATELET
Abs Immature Granulocytes: 0.05 10*3/uL (ref 0.00–0.07)
Basophils Absolute: 0 10*3/uL (ref 0.0–0.1)
Basophils Relative: 0 %
Eosinophils Absolute: 0 10*3/uL (ref 0.0–0.5)
Eosinophils Relative: 0 %
HCT: 37.2 % (ref 36.0–46.0)
Hemoglobin: 11.3 g/dL — ABNORMAL LOW (ref 12.0–15.0)
Immature Granulocytes: 1 %
Lymphocytes Relative: 13 %
Lymphs Abs: 1.3 10*3/uL (ref 0.7–4.0)
MCH: 32.3 pg (ref 26.0–34.0)
MCHC: 30.4 g/dL (ref 30.0–36.0)
MCV: 106.3 fL — ABNORMAL HIGH (ref 80.0–100.0)
Monocytes Absolute: 0.8 10*3/uL (ref 0.1–1.0)
Monocytes Relative: 9 %
Neutro Abs: 7.5 10*3/uL (ref 1.7–7.7)
Neutrophils Relative %: 77 %
Platelets: 199 10*3/uL (ref 150–400)
RBC: 3.5 MIL/uL — ABNORMAL LOW (ref 3.87–5.11)
RDW: 14 % (ref 11.5–15.5)
WBC: 9.7 10*3/uL (ref 4.0–10.5)
nRBC: 0 % (ref 0.0–0.2)

## 2019-10-21 LAB — COMPREHENSIVE METABOLIC PANEL
ALT: 12 U/L (ref 0–44)
AST: 10 U/L — ABNORMAL LOW (ref 15–41)
Albumin: 3.5 g/dL (ref 3.5–5.0)
Alkaline Phosphatase: 71 U/L (ref 38–126)
Anion gap: 10 (ref 5–15)
BUN: 8 mg/dL (ref 8–23)
CO2: 29 mmol/L (ref 22–32)
Calcium: 9.7 mg/dL (ref 8.9–10.3)
Chloride: 100 mmol/L (ref 98–111)
Creatinine, Ser: 0.58 mg/dL (ref 0.44–1.00)
GFR, Estimated: >60 mL/min (ref >60)
Glucose, Bld: 114 mg/dL — ABNORMAL HIGH (ref 70–99)
Potassium: 3.9 mmol/L (ref 3.5–5.1)
Sodium: 139 mmol/L (ref 135–145)
Total Bilirubin: 0.6 mg/dL (ref 0.3–1.2)
Total Protein: 6.4 g/dL — ABNORMAL LOW (ref 6.5–8.1)

## 2019-10-21 LAB — TROPONIN I (HIGH SENSITIVITY)
Troponin I (High Sensitivity): 7 ng/L (ref <18)
Troponin I (High Sensitivity): 6 ng/L (ref <18)

## 2019-10-21 LAB — RESPIRATORY PANEL BY RT PCR (FLU A&B, COVID)
Influenza A by PCR: NEGATIVE
Influenza B by PCR: NEGATIVE
SARS Coronavirus 2 by RT PCR: NEGATIVE

## 2019-10-21 LAB — D-DIMER, QUANTITATIVE: D-Dimer, Quant: 0.52 ug/mL-FEU — ABNORMAL HIGH (ref 0.00–0.50)

## 2019-10-21 LAB — BRAIN NATRIURETIC PEPTIDE: B Natriuretic Peptide: 55.9 pg/mL (ref 0.0–100.0)

## 2019-10-21 MED ORDER — PANTOPRAZOLE SODIUM 40 MG PO TBEC
40.0000 mg | DELAYED_RELEASE_TABLET | Freq: Every day | ORAL | Status: DC
  Administered 2019-10-21 – 2019-10-24 (×4): 40 mg via ORAL
  Filled 2019-10-21 (×4): qty 1

## 2019-10-21 MED ORDER — ONDANSETRON HCL 4 MG PO TABS: 4.0000 mg | ORAL_TABLET | Freq: Four times a day (QID) | ORAL | Status: DC | PRN

## 2019-10-21 MED ORDER — PREDNISONE 20 MG PO TABS
40.0000 mg | ORAL_TABLET | Freq: Every day | ORAL | Status: DC
  Administered 2019-10-22 – 2019-10-24 (×3): 40 mg via ORAL
  Filled 2019-10-21 (×3): qty 2

## 2019-10-21 MED ORDER — ALPRAZOLAM 0.25 MG PO TABS
0.2500 mg | ORAL_TABLET | Freq: Every day | ORAL | Status: DC | PRN
  Administered 2019-10-21 – 2019-10-24 (×4): 0.25 mg via ORAL
  Filled 2019-10-21 (×4): qty 1

## 2019-10-21 MED ORDER — RISPERIDONE 1 MG PO TABS
3.0000 mg | ORAL_TABLET | Freq: Every day | ORAL | Status: DC
  Administered 2019-10-21 – 2019-10-23 (×3): 3 mg via ORAL
  Filled 2019-10-21: qty 3
  Filled 2019-10-21: qty 1
  Filled 2019-10-21: qty 3

## 2019-10-21 MED ORDER — ALBUTEROL SULFATE HFA 108 (90 BASE) MCG/ACT IN AERS
4.0000 | INHALATION_SPRAY | Freq: Once | RESPIRATORY_TRACT | Status: AC
  Administered 2019-10-21: 4 via RESPIRATORY_TRACT

## 2019-10-21 MED ORDER — ALBUTEROL SULFATE HFA 108 (90 BASE) MCG/ACT IN AERS
1.0000 | INHALATION_SPRAY | RESPIRATORY_TRACT | Status: DC
  Administered 2019-10-21: 2 via RESPIRATORY_TRACT
  Filled 2019-10-21: qty 6.7

## 2019-10-21 MED ORDER — ENOXAPARIN SODIUM 40 MG/0.4ML ~~LOC~~ SOLN
40.0000 mg | SUBCUTANEOUS | Status: DC
  Administered 2019-10-21 – 2019-10-23 (×3): 40 mg via SUBCUTANEOUS
  Filled 2019-10-21 (×3): qty 0.4

## 2019-10-21 MED ORDER — FUROSEMIDE 40 MG PO TABS
40.0000 mg | ORAL_TABLET | Freq: Every day | ORAL | Status: DC
  Administered 2019-10-21 – 2019-10-24 (×4): 40 mg via ORAL
  Filled 2019-10-21 (×4): qty 1

## 2019-10-21 MED ORDER — VENLAFAXINE HCL ER 150 MG PO CP24
150.0000 mg | ORAL_CAPSULE | Freq: Every day | ORAL | Status: DC
  Administered 2019-10-21 – 2019-10-23 (×3): 150 mg via ORAL
  Filled 2019-10-21 (×2): qty 1
  Filled 2019-10-21: qty 2

## 2019-10-21 MED ORDER — METHYLPHENIDATE HCL ER 18 MG PO TB24
72.0000 mg | ORAL_TABLET | Freq: Every day | ORAL | Status: DC
  Administered 2019-10-22 – 2019-10-24 (×3): 72 mg via ORAL
  Filled 2019-10-21 (×4): qty 4

## 2019-10-21 MED ORDER — QUETIAPINE FUMARATE 50 MG PO TABS
150.0000 mg | ORAL_TABLET | Freq: Every day | ORAL | Status: DC
  Administered 2019-10-21 – 2019-10-23 (×3): 150 mg via ORAL
  Filled 2019-10-21: qty 1
  Filled 2019-10-21 (×2): qty 3

## 2019-10-21 MED ORDER — ACETAMINOPHEN 325 MG PO TABS
650.0000 mg | ORAL_TABLET | Freq: Four times a day (QID) | ORAL | Status: DC | PRN
  Administered 2019-10-24: 650 mg via ORAL
  Filled 2019-10-21: qty 2

## 2019-10-21 MED ORDER — IPRATROPIUM-ALBUTEROL 0.5-2.5 (3) MG/3ML IN SOLN
3.0000 mL | Freq: Once | RESPIRATORY_TRACT | Status: AC
  Administered 2019-10-21: 3 mL via RESPIRATORY_TRACT
  Filled 2019-10-21: qty 3

## 2019-10-21 MED ORDER — ACETAMINOPHEN 650 MG RE SUPP: 650.0000 mg | Freq: Four times a day (QID) | RECTAL | Status: DC | PRN

## 2019-10-21 MED ORDER — LAMOTRIGINE 100 MG PO TABS
200.0000 mg | ORAL_TABLET | Freq: Every day | ORAL | Status: DC
  Administered 2019-10-21 – 2019-10-23 (×3): 200 mg via ORAL
  Filled 2019-10-21 (×3): qty 2

## 2019-10-21 MED ORDER — BACLOFEN 10 MG PO TABS
10.0000 mg | ORAL_TABLET | Freq: Once | ORAL | Status: AC
  Administered 2019-10-21: 10 mg via ORAL
  Filled 2019-10-21: qty 1

## 2019-10-21 MED ORDER — INFLUENZA VAC A&B SA ADJ QUAD 0.5 ML IM PRSY
0.5000 mL | PREFILLED_SYRINGE | INTRAMUSCULAR | Status: AC
  Administered 2019-10-23: 0.5 mL via INTRAMUSCULAR
  Filled 2019-10-21: qty 0.5

## 2019-10-21 MED ORDER — ONDANSETRON HCL 4 MG/2ML IJ SOLN: 4.0000 mg | Freq: Four times a day (QID) | INTRAMUSCULAR | Status: DC | PRN

## 2019-10-21 MED ORDER — METHYLPHENIDATE HCL ER (OSM) 18 MG PO TBCR: 18.0000 mg | EXTENDED_RELEASE_TABLET | Freq: Every day | ORAL | Status: DC

## 2019-10-21 MED ORDER — BACLOFEN 10 MG PO TABS
10.0000 mg | ORAL_TABLET | Freq: Three times a day (TID) | ORAL | Status: DC | PRN
  Administered 2019-10-21 – 2019-10-24 (×6): 10 mg via ORAL
  Filled 2019-10-21 (×6): qty 1

## 2019-10-21 MED ORDER — METHYLPREDNISOLONE SODIUM SUCC 125 MG IJ SOLR
125.0000 mg | Freq: Once | INTRAMUSCULAR | Status: AC
  Administered 2019-10-21: 125 mg via INTRAVENOUS
  Filled 2019-10-21: qty 2

## 2019-10-21 MED ORDER — HYDROCODONE-ACETAMINOPHEN 5-325 MG PO TABS
1.0000 | ORAL_TABLET | Freq: Three times a day (TID) | ORAL | Status: DC | PRN
  Administered 2019-10-21 – 2019-10-24 (×7): 1 via ORAL
  Filled 2019-10-21 (×8): qty 1

## 2019-10-21 MED ORDER — MOMETASONE FURO-FORMOTEROL FUM 200-5 MCG/ACT IN AERO
1.0000 | INHALATION_SPRAY | Freq: Two times a day (BID) | RESPIRATORY_TRACT | Status: DC
  Administered 2019-10-21 – 2019-10-24 (×6): 1 via RESPIRATORY_TRACT
  Filled 2019-10-21: qty 8.8

## 2019-10-21 MED ORDER — IPRATROPIUM-ALBUTEROL 0.5-2.5 (3) MG/3ML IN SOLN
3.0000 mL | Freq: Four times a day (QID) | RESPIRATORY_TRACT | Status: DC
  Administered 2019-10-21 – 2019-10-24 (×12): 3 mL via RESPIRATORY_TRACT
  Filled 2019-10-21 (×12): qty 3

## 2019-10-21 MED ORDER — METHYLPHENIDATE HCL ER (OSM) 27 MG PO TBCR: 54.0000 mg | EXTENDED_RELEASE_TABLET | ORAL | Status: DC

## 2019-10-21 NOTE — Progress Notes (Signed)
RT Note: ABG drawn on 4 lpm n/c.

## 2019-10-21 NOTE — ED Notes (Signed)
Pt requesting home pain medication. MD aware

## 2019-10-21 NOTE — ED Notes (Signed)
EDP at bedside  

## 2019-10-21 NOTE — ED Notes (Signed)
Pt provided with meal tray at this time 

## 2019-10-21 NOTE — ED Provider Notes (Signed)
[facility] DEPT Provider Note   CSN: [id number] Arrival date & time: 10/21/19  7078     History Chief Complaint  Patient presents with  . Shortness of Breath    Julia Daniels is a 69 y.o. female.  Patient is a 69 year old female with past medical history of COPD, asthma, anxiety.  She presents today for evaluation of shortness of breath.  Patient with recent admission for COPD exacerbation and discharged to extended care facility where her symptoms began yesterday.  She denies fevers, chills, chest pain, or cough.  She does report swelling in her legs which she tells me she has not had before.  She denies any history of congestive heart failure or coronary disease.  The history is provided by the patient.  Shortness of Breath Severity:  Moderate Onset quality:  Sudden Duration:  24 hours Timing:  Constant Progression:  Worsening Chronicity:  Recurrent Context: activity   Context: not URI   Relieved by:  Nothing Worsened by:  Nothing Ineffective treatments:  None tried Associated symptoms: no cough, no fever and no sputum production        Past Medical History:  Diagnosis Date  . Anxiety   . Asthma   . COPD (chronic obstructive pulmonary disease) ([place])   . Depression     Patient Active Problem List   Diagnosis Date Noted  . Palliative care by specialist   . Goals of care, counseling/discussion   . DNR (do not resuscitate)   . Acute respiratory failure with hypercapnia ([place]) 09/29/2019  . Drug induced constipation   . Leukocytosis   . Supplemental oxygen dependent   . Anxiety state   . Debility 09/13/2019  . Back pain 09/08/2019  . Essential hypertension 07/27/2019  . Allergic rhinitis 03/14/2018  . Acute respiratory failure with hypoxia ([place]) 03/08/2018  . Elevated MCV 01/31/2018  . Vitamin B12 deficiency 01/31/2018  . Respiratory failure, acute-on-chronic ([place]) 12/31/2017  . COPD exacerbation ([place]) 09/18/2017  .  Hypoxia   . Hyperlipidemia 08/23/2017  . Depression 08/23/2017  . Anxiety 08/23/2017  . Hypercalcemia 08/23/2017  . Thrombocytosis 08/23/2017  . COPD with acute exacerbation ([place]) 08/23/2017  . ADD (attention deficit disorder) 08/30/2012  . Bipolar disorder, unspecified ([place]) 08/30/2012  . COPD  GOLD ? III with increased Eos and reversibility ? ACOS ?  08/30/2012  . Tobacco abuse 08/30/2012    Past Surgical History:  Procedure Laterality Date  . ABDOMINAL HYSTERECTOMY     partial      OB History   No obstetric history on file.     Family History  Problem Relation Age of Onset  . Asthma Mother   . Lung cancer Father   . COPD Father     Social History   Tobacco Use  . Smoking status: Former Smoker    Packs/day: 1.00    Years: 40.00    Pack years: 40.00    Types: Cigarettes    Quit date: 08/23/2017    Years since quitting: 2.1  . Smokeless tobacco: Never Used  Substance Use Topics  . Alcohol use: No  . Drug use: No    Home Medications Prior to Admission medications   Medication Sig Start Date End Date Taking? Authorizing Provider  albuterol (PROAIR HFA) 108 (90 Base) MCG/ACT inhaler Inhale 2 puffs into the lungs every 6 (six) hours as needed for wheezing or shortness of breath. 07/25/19   Tanda Rockers, MD  albuterol (PROVENTIL) (2.5 MG/3ML) 0.083% nebulizer solution  Take 3 mLs (2.5 mg total) by nebulization every 6 (six) hours as needed for wheezing or shortness of breath. 09/10/19   Tanda Rockers, MD  ALPRAZolam Duanne Moron) 0.25 MG tablet Take 1 tablet (0.25 mg total) by mouth daily as needed for anxiety. 10/07/19   Aline August, MD  atorvastatin (LIPITOR) 40 MG tablet Take 1 tablet (40 mg total) by mouth daily. 09/12/19 10/12/19  Florencia Reasons, MD  baclofen (LIORESAL) 10 MG tablet Take 10 mg by mouth 3 (three) times daily as needed. 09/05/19   [provider]  budesonide-formoterol (SYMBICORT) 160-4.5 MCG/ACT inhaler INHALE 2 PUFFS INTO LUNGS TWICE A DAY Patient  taking differently: Inhale 2 puffs into the lungs in the morning and at bedtime.  08/26/19   Tanda Rockers, MD  EPINEPHrine 0.3 mg/0.3 mL IJ SOAJ injection Inject 0.3 mLs (0.3 mg total) into the muscle as needed for anaphylaxis. 07/15/19   Martyn Ehrich, NP  famotidine (PEPCID) 20 MG tablet One at bedtime Patient taking differently: Take 20 mg by mouth at bedtime.  07/25/19   Tanda Rockers, MD  FASENRA 30 MG/ML SOSY INJECT 1 SYRINGE UNDER THE SKIN EVERY 8 WEEKS. Patient taking differently: Inject 30 mg into the skin every 8 (eight) weeks.  01/20/19   Tanda Rockers, MD  guaiFENesin (MUCINEX) 600 MG 12 hr tablet Take 1 tablet (600 mg total) by mouth 2 (two) times daily. 09/12/19   Florencia Reasons, MD  HYDROcodone-acetaminophen (NORCO/VICODIN) 5-325 MG tablet Take 1 tablet by mouth 3 (three) times daily as needed for moderate pain. 10/07/19   Aline August, MD  ibuprofen (ADVIL) 800 MG tablet Take 800 mg by mouth 2 (two) times daily as needed for pain. 08/25/19   [provider]  lamoTRIgine (LAMICTAL) 200 MG tablet Take 200 mg by mouth at bedtime.     [provider]  lidocaine (LIDODERM) 5 % Place 1 patch onto the skin daily. Remove & Discard patch within 12 hours or as directed by MD 09/12/19   Florencia Reasons, MD  loratadine (CLARITIN) 10 MG tablet Take 10 mg by mouth every evening.     [provider]  methylphenidate 18 MG PO CR tablet Take 18 mg by mouth daily. Take with 54 mg tablet= 72mg  07/19/19   [provider]  methylphenidate 54 MG PO CR tablet Take 54 mg by mouth every morning. Take with 18 mg tablet= 72mg     [provider]  montelukast (SINGULAIR) 10 MG tablet Take 1 tablet (10 mg total) by mouth at bedtime. 04/17/19   Tanda Rockers, MD  pantoprazole (PROTONIX) 40 MG tablet Take 1 tablet (40 mg total) by mouth daily. Take 30-60 min before first meal of the day 07/25/19   Tanda Rockers, MD  QUEtiapine (SEROQUEL) 100 MG tablet Take 150 mg by mouth at  bedtime. 08/18/19   [provider]  risperiDONE (RISPERDAL) 1 MG tablet Take 3 mg by mouth at bedtime.  07/06/17   [provider]  senna-docusate (SENOKOT-S) 8.6-50 MG tablet Take 1 tablet by mouth 2 (two) times daily. 09/12/19   Florencia Reasons, MD  Spacer/Aero-Holding Chambers (AEROCHAMBER MV) inhaler Use as instructed 03/14/18   Lauraine Rinne, NP  Tiotropium Bromide Monohydrate (SPIRIVA RESPIMAT) 2.5 MCG/ACT AERS Inhale 2 puffs into the lungs daily. 08/29/19   Tanda Rockers, MD  venlafaxine XR (EFFEXOR-XR) 150 MG 24 hr capsule Take 150 mg by mouth at bedtime.     [provider]  Vitamin D, Ergocalciferol, (DRISDOL) 50000 units CAPS capsule Take 50,000 Units by mouth every Saturday.  06/18/17   [provider]    Allergies    Sulfa antibiotics  Review of Systems   Review of Systems  Constitutional: Negative for fever.  Respiratory: Positive for shortness of breath. Negative for cough and sputum production.   All other systems reviewed and are negative.   Physical Exam Updated Vital Signs BP (!) 156/104   Pulse (!) 108   Temp 98.5 F (36.9 C) (Oral)   Resp (!) 22   SpO2 98%   Physical Exam Vitals and nursing note reviewed.  Constitutional:      General: She is not in acute distress.    Appearance: She is well-developed. She is not diaphoretic.  HENT:     Head: Normocephalic and atraumatic.  Cardiovascular:     Rate and Rhythm: Normal rate and regular rhythm.     Heart sounds: No murmur heard.  No friction rub. No gallop.   Pulmonary:     Effort: Tachypnea and respiratory distress present.     Breath sounds: Examination of the right-middle field reveals rhonchi. Examination of the left-middle field reveals rhonchi. Rhonchi present. No wheezing.     Comments: Mild respiratory distress/increased work of breathing noted.   Abdominal:     General: Bowel sounds are normal. There is no distension.     Palpations: Abdomen is soft.     Tenderness: There  is no abdominal tenderness.  Musculoskeletal:        General: Normal range of motion.     Cervical back: Normal range of motion and neck supple.     Right lower leg: Edema present.     Left lower leg: Edema present.     Comments: There is 2-3+ pitting edema to both lower extremities  Skin:    General: Skin is warm and dry.  Neurological:     Mental Status: She is alert and oriented to person, place, and time.     ED Results / Procedures / Treatments   Labs (all labs ordered are listed, but only abnormal results are displayed) Labs Reviewed  COMPREHENSIVE METABOLIC PANEL  BRAIN NATRIURETIC PEPTIDE  CBC WITH DIFFERENTIAL/PLATELET  TROPONIN I (HIGH SENSITIVITY)    EKG EKG Interpretation  Date/Time:  Tuesday October 21 2019 07:20:44 EDT Ventricular Rate:  97 PR Interval:    QRS Duration: 87 QT Interval:  330 QTC Calculation: 420 R Axis:   73 Text Interpretation: Sinus rhythm Abnrm T, consider ischemia, anterolateral lds Confirmed by Veryl Speak [phone] on 10/21/2019 4:29:01 PM   Radiology DG Chest 2 View  Result Date: 10/21/2019 CLINICAL DATA:  Shortness of breath. Additional history provided: Increasing shortness of breath, difficulty breathing since Friday, history of COPD. EXAM: CHEST - 2 VIEW COMPARISON:  Prior chest radiographs 10/01/2019 and earlier. FINDINGS: Heart size within normal limits. There are linear opacities within the lung bases bilaterally. No evidence of pleural effusion or pneumothorax. No acute bony abnormality identified. Thoracic spondylosis. IMPRESSION: Linear opacities within the bilateral lung bases with an appearance most suggestive of linear atelectasis and/or scarring. Electronically Signed   By: Kellie Simmering DO   On: 10/21/2019 07:49    Procedures Procedures (including critical care time)  Medications Ordered in ED Medications  albuterol (VENTOLIN HFA) 108 (90 Base) MCG/ACT inhaler 1-2 puff (2 puffs Inhalation Given 10/21/19 0905)   albuterol (VENTOLIN HFA) 108 (90 Base) MCG/ACT inhaler 4 puff (has no administration in time range)  methylPREDNISolone sodium succinate (SOLU-MEDROL) 125 mg/2 mL injection 125 mg (has no administration in time range)    ED Course  I have reviewed the triage vital signs and the nursing notes.  Pertinent labs & imaging results that were available during my care of the patient were reviewed by me and considered in my medical decision making (see chart for details).    MDM Rules/Calculators/A&P  Patient is a 69 year old female presenting with shortness of breath.  She has history of COPD with recent hospitalization and has been in a rehab facility since.  She is on oxygen there 4 L by nasal cannula, but was not on this at home.  Patient arrives here in mild respiratory distress with accessory muscle use.  Her oxygen saturations are in the low 90s while on 4 L.  She was given albuterol by MDI along with Solu-Medrol here in the ED.  After Covid test was negative, she was given a DuoNeb x2 still with little relief.  At this point, I feel as though patient will require admission for additional IV steroids and nebulizer treatments.  Have spoken with the hospitalist who agrees to admit.  Final Clinical Impression(s) / ED Diagnoses Final diagnoses:  None    Rx / DC Orders ED Discharge Orders    None       Veryl Speak, MD 10/21/19 1630

## 2019-10-21 NOTE — ED Notes (Signed)
Pt provided with mouth swabs

## 2019-10-21 NOTE — Progress Notes (Signed)
TOC CM/CSW received a call from [facility] [phone] in reference to pts return.  When pt is medically stable pt must be sent back to facility before 12am 10/22/2019, or she will have to be re-authorized for SNF placement.  Pt may lose bed at [facility].  CSW will continue to follow for dc needs.  Collene Massimino Tarpley-Carter, MSW, LCSW-A Pronouns:  She, Her, Hers                  [facility] ED Transitions of CareClinical Social Worker [email or web address] [phone]

## 2019-10-21 NOTE — ED Notes (Signed)
Pt provided with fan at this time.

## 2019-10-21 NOTE — H&P (Signed)
History and Physical    GEOVANA GEBEL INO:[id number] DOB: 11-19-1950 DOA: 10/21/2019  PCP: Simona Huh, NP  Patient coming from: SNF  Chief Complaint: dyspnea  HPI: Julia Daniels is a 69 y.o. female with medical history significant of COPD with PRN O2 use at home. Presents with 4 days of dyspnea. Friday night she noticed dyspnea that worsened over the weekend. She had to have 2 - 4 nebulizers a day. She was supposed have PT/OT yesterday, but she couldn't complete it d/t her breathing. Her dyspnea worsened through last night. She noticed her SpO2 was down into the 80's. She tried increasing her O2 but it didn't help (normally uses O2 PRN at home). So, her SNF called EMS this morning. Of note, she reports increased BLE swelling during this time.   ED Course: CXR was checked. It showed atelectasis. BNP was normal. Trp was normal. She was given steroids and nebs. TRH was called for admission.   Review of Systems: No CP, palpitations, F, N/V/D.  Review of systems is otherwise negative for all not mentioned in HPI.   PMHx Past Medical History:  Diagnosis Date  . Anxiety   . Asthma   . COPD (chronic obstructive pulmonary disease) ([place])   . Depression     PSHx Past Surgical History:  Procedure Laterality Date  . ABDOMINAL HYSTERECTOMY     partial     SocHx  reports that she quit smoking about 2 years ago. Her smoking use included cigarettes. She has a 40.00 pack-year smoking history. She has never used smokeless tobacco. She reports that she does not drink alcohol and does not use drugs.  Allergies  Allergen Reactions  . Sulfa Antibiotics Other (See Comments)    Mouth gets raw    FamHx Family History  Problem Relation Age of Onset  . Asthma Mother   . Lung cancer Father   . COPD Father     Prior to Admission medications   Medication Sig Start Date End Date Taking? Authorizing Provider  albuterol (PROAIR HFA) 108 (90 Base) MCG/ACT inhaler Inhale 2 puffs into  the lungs every 6 (six) hours as needed for wheezing or shortness of breath. 07/25/19   Tanda Rockers, MD  albuterol (PROVENTIL) (2.5 MG/3ML) 0.083% nebulizer solution Take 3 mLs (2.5 mg total) by nebulization every 6 (six) hours as needed for wheezing or shortness of breath. 09/10/19   Tanda Rockers, MD  ALPRAZolam Duanne Moron) 0.25 MG tablet Take 1 tablet (0.25 mg total) by mouth daily as needed for anxiety. 10/07/19   Aline August, MD  atorvastatin (LIPITOR) 40 MG tablet Take 1 tablet (40 mg total) by mouth daily. 09/12/19 10/12/19  Florencia Reasons, MD  baclofen (LIORESAL) 10 MG tablet Take 10 mg by mouth 3 (three) times daily as needed. 09/05/19   [provider]  budesonide-formoterol (SYMBICORT) 160-4.5 MCG/ACT inhaler INHALE 2 PUFFS INTO LUNGS TWICE A DAY Patient taking differently: Inhale 2 puffs into the lungs in the morning and at bedtime.  08/26/19   Tanda Rockers, MD  EPINEPHrine 0.3 mg/0.3 mL IJ SOAJ injection Inject 0.3 mLs (0.3 mg total) into the muscle as needed for anaphylaxis. 07/15/19   Martyn Ehrich, NP  famotidine (PEPCID) 20 MG tablet One at bedtime Patient taking differently: Take 20 mg by mouth at bedtime.  07/25/19   Tanda Rockers, MD  FASENRA 30 MG/ML SOSY INJECT 1 SYRINGE UNDER THE SKIN EVERY 8 WEEKS. Patient taking differently: Inject 30 mg into the  skin every 8 (eight) weeks.  01/20/19   Tanda Rockers, MD  guaiFENesin (MUCINEX) 600 MG 12 hr tablet Take 1 tablet (600 mg total) by mouth 2 (two) times daily. 09/12/19   Florencia Reasons, MD  HYDROcodone-acetaminophen (NORCO/VICODIN) 5-325 MG tablet Take 1 tablet by mouth 3 (three) times daily as needed for moderate pain. 10/07/19   Aline August, MD  ibuprofen (ADVIL) 800 MG tablet Take 800 mg by mouth 2 (two) times daily as needed for pain. 08/25/19   [provider]  lamoTRIgine (LAMICTAL) 200 MG tablet Take 200 mg by mouth at bedtime.     [provider]  lidocaine (LIDODERM) 5 % Place 1 patch onto the skin daily.  Remove & Discard patch within 12 hours or as directed by MD 09/12/19   Florencia Reasons, MD  loratadine (CLARITIN) 10 MG tablet Take 10 mg by mouth every evening.     [provider]  methylphenidate 18 MG PO CR tablet Take 18 mg by mouth daily. Take with 54 mg tablet= 72mg  07/19/19   [provider]  methylphenidate 54 MG PO CR tablet Take 54 mg by mouth every morning. Take with 18 mg tablet= 72mg     [provider]  montelukast (SINGULAIR) 10 MG tablet Take 1 tablet (10 mg total) by mouth at bedtime. 04/17/19   Tanda Rockers, MD  pantoprazole (PROTONIX) 40 MG tablet Take 1 tablet (40 mg total) by mouth daily. Take 30-60 min before first meal of the day 07/25/19   Tanda Rockers, MD  QUEtiapine (SEROQUEL) 100 MG tablet Take 150 mg by mouth at bedtime. 08/18/19   [provider]  risperiDONE (RISPERDAL) 1 MG tablet Take 3 mg by mouth at bedtime.  07/06/17   [provider]  senna-docusate (SENOKOT-S) 8.6-50 MG tablet Take 1 tablet by mouth 2 (two) times daily. 09/12/19   Florencia Reasons, MD  Spacer/Aero-Holding Chambers (AEROCHAMBER MV) inhaler Use as instructed 03/14/18   Lauraine Rinne, NP  Tiotropium Bromide Monohydrate (SPIRIVA RESPIMAT) 2.5 MCG/ACT AERS Inhale 2 puffs into the lungs daily. 08/29/19   Tanda Rockers, MD  venlafaxine XR (EFFEXOR-XR) 150 MG 24 hr capsule Take 150 mg by mouth at bedtime.     [provider]  Vitamin D, Ergocalciferol, (DRISDOL) 50000 units CAPS capsule Take 50,000 Units by mouth every Saturday.  06/18/17   [provider]    Physical Exam: Vitals:   10/21/19 1200 10/21/19 1306 10/21/19 1315 10/21/19 1346  BP: 135/82   (!) 151/74  Pulse: (!) 104 (!) 105 (!) 103 (!) 102  Resp: (!) 25 (!) 32 19 (!) 26  Temp:      TempSrc:      SpO2: 95% 90% 95% 91%    General: 69 y.o. female resting in bed in NAD Eyes: PERRL, normal sclera ENMT: Nares patent w/o discharge, orophaynx clear, dentition normal, ears w/o  discharge/lesions/ulcers Neck: Supple, trachea midline Cardiovascular: RRR, +S1, S2, no m/g/r, equal pulses throughout Respiratory:decreased at bases, tachypnea, no w/r/r, normal WOB GI: BS+, NDNT, no masses noted, no organomegaly noted MSK: No c/c; BLE 3+ pitting edema Skin: No rashes, bruises, ulcerations noted Neuro: A&O x 3, no focal deficits Psyc: Appropriate interaction and affect; she is anxious  Labs on Admission: I have personally reviewed following labs and imaging studies  CBC: Recent Labs  Lab 10/21/19 0941  WBC 9.7  NEUTROABS 7.5  HGB 11.3*  HCT 37.2  MCV 106.3*  PLT 199   Basic  Metabolic Panel: Recent Labs  Lab 10/21/19 0941  NA 139  K 3.9  CL 100  CO2 29  GLUCOSE 114*  BUN 8  CREATININE 0.58  CALCIUM 9.7   GFR: CrCl cannot be calculated (Unknown ideal weight.). Liver Function Tests: Recent Labs  Lab 10/21/19 0941  AST 10*  ALT 12  ALKPHOS 71  BILITOT 0.6  PROT 6.4*  ALBUMIN 3.5   No results for input(s): LIPASE, AMYLASE in the last 168 hours. No results for input(s): AMMONIA in the last 168 hours. Coagulation Profile: No results for input(s): INR, PROTIME in the last 168 hours. Cardiac Enzymes: No results for input(s): CKTOTAL, CKMB, CKMBINDEX, TROPONINI in the last 168 hours. BNP (last 3 results) No results for input(s): PROBNP in the last 8760 hours. HbA1C: No results for input(s): HGBA1C in the last 72 hours. CBG: No results for input(s): GLUCAP in the last 168 hours. Lipid Profile: No results for input(s): CHOL, HDL, LDLCALC, TRIG, CHOLHDL, LDLDIRECT in the last 72 hours. Thyroid Function Tests: No results for input(s): TSH, T4TOTAL, FREET4, T3FREE, THYROIDAB in the last 72 hours. Anemia Panel: No results for input(s): VITAMINB12, FOLATE, FERRITIN, TIBC, IRON, RETICCTPCT in the last 72 hours. Urine analysis:    Component Value Date/Time   COLORURINE YELLOW 09/10/2019 1302   APPEARANCEUR CLEAR 09/10/2019 1302   LABSPEC 1.018  09/10/2019 1302   PHURINE 5.0 09/10/2019 1302   GLUCOSEU NEGATIVE 09/10/2019 1302   HGBUR NEGATIVE 09/10/2019 1302   BILIRUBINUR NEGATIVE 09/10/2019 1302   BILIRUBINUR n 09/23/2012 1111   KETONESUR NEGATIVE 09/10/2019 1302   PROTEINUR NEGATIVE 09/10/2019 1302   UROBILINOGEN 0.2 09/23/2012 1111   NITRITE NEGATIVE 09/10/2019 1302   LEUKOCYTESUR NEGATIVE 09/10/2019 1302    Radiological Exams on Admission: DG Chest 2 View  Result Date: 10/21/2019 CLINICAL DATA:  Shortness of breath. Additional history provided: Increasing shortness of breath, difficulty breathing since Friday, history of COPD. EXAM: CHEST - 2 VIEW COMPARISON:  Prior chest radiographs 10/01/2019 and earlier. FINDINGS: Heart size within normal limits. There are linear opacities within the lung bases bilaterally. No evidence of pleural effusion or pneumothorax. No acute bony abnormality identified. Thoracic spondylosis. IMPRESSION: Linear opacities within the bilateral lung bases with an appearance most suggestive of linear atelectasis and/or scarring. Electronically Signed   By: Kellie Simmering DO   On: 10/21/2019 07:49   Assessment/Plan ?COPD exacerbation Dyspnea     - admit to observation, med-surg     - continue duonebs q6h and solumedrol 40mg  qday     - multiple admission for the same over the past few months     - check d-dimer and ABG     - add IS  BLE edema     - new onset per her report; starting Friday. Last echo in August showed G1DD     - BNP is normal, trp is normal     - trial low dose lasix (40mg ); watch renal function  HLD     - atorvastatin  Chronic back pain     - PRN baclofen  GERD     - protonix  Anxiety Bipolar d/o     - lamotrigine,quetiapine, methylphenidate, risperidone, effexor  DVT prophylaxis: lovenox  Code Status: DNR  Family Communication: None at bedside.  Consults called: None  Admission status: Observation   Status is: Observation  The patient remains OBS appropriate and  will d/c before 2 midnights.  Dispo: The patient is from: SNF  Anticipated d/c is to: SNF              Anticipated d/c date is: 1 day              Patient currently is not medically stable to d/c.  Jonnie Finner DO [facility]  If 7PM-7AM, please contact night-coverage [email or web address]  10/21/2019, 2:33 PM

## 2019-10-21 NOTE — ED Triage Notes (Signed)
Per EMS, states patient coming from [facility] for rehab for COPD-complaining of SOB for 2 days-given neb treatment at 5 am-placed on 5L  by EMS at 91%

## 2019-10-22 ENCOUNTER — Other Ambulatory Visit: Payer: Self-pay

## 2019-10-22 DIAGNOSIS — J441 Chronic obstructive pulmonary disease with (acute) exacerbation: Secondary | ICD-10-CM | POA: Diagnosis present

## 2019-10-22 DIAGNOSIS — F319 Bipolar disorder, unspecified: Secondary | ICD-10-CM | POA: Diagnosis not present

## 2019-10-22 DIAGNOSIS — Z87891 Personal history of nicotine dependence: Secondary | ICD-10-CM | POA: Diagnosis not present

## 2019-10-22 DIAGNOSIS — Z23 Encounter for immunization: Secondary | ICD-10-CM | POA: Diagnosis not present

## 2019-10-22 DIAGNOSIS — K219 Gastro-esophageal reflux disease without esophagitis: Secondary | ICD-10-CM | POA: Diagnosis present

## 2019-10-22 DIAGNOSIS — Z825 Family history of asthma and other chronic lower respiratory diseases: Secondary | ICD-10-CM | POA: Diagnosis not present

## 2019-10-22 DIAGNOSIS — J9621 Acute and chronic respiratory failure with hypoxia: Secondary | ICD-10-CM | POA: Diagnosis present

## 2019-10-22 DIAGNOSIS — Z66 Do not resuscitate: Secondary | ICD-10-CM | POA: Diagnosis present

## 2019-10-22 DIAGNOSIS — F419 Anxiety disorder, unspecified: Secondary | ICD-10-CM | POA: Diagnosis present

## 2019-10-22 DIAGNOSIS — G8929 Other chronic pain: Secondary | ICD-10-CM

## 2019-10-22 DIAGNOSIS — Z20822 Contact with and (suspected) exposure to covid-19: Secondary | ICD-10-CM | POA: Diagnosis present

## 2019-10-22 DIAGNOSIS — Z79899 Other long term (current) drug therapy: Secondary | ICD-10-CM | POA: Diagnosis not present

## 2019-10-22 DIAGNOSIS — M549 Dorsalgia, unspecified: Secondary | ICD-10-CM | POA: Diagnosis present

## 2019-10-22 DIAGNOSIS — I5032 Chronic diastolic (congestive) heart failure: Secondary | ICD-10-CM

## 2019-10-22 DIAGNOSIS — Z801 Family history of malignant neoplasm of trachea, bronchus and lung: Secondary | ICD-10-CM | POA: Diagnosis not present

## 2019-10-22 DIAGNOSIS — E785 Hyperlipidemia, unspecified: Secondary | ICD-10-CM | POA: Diagnosis present

## 2019-10-22 DIAGNOSIS — L89322 Pressure ulcer of left buttock, stage 2: Secondary | ICD-10-CM | POA: Diagnosis present

## 2019-10-22 DIAGNOSIS — L89312 Pressure ulcer of right buttock, stage 2: Secondary | ICD-10-CM | POA: Diagnosis present

## 2019-10-22 DIAGNOSIS — R0602 Shortness of breath: Secondary | ICD-10-CM | POA: Diagnosis present

## 2019-10-22 DIAGNOSIS — Z7951 Long term (current) use of inhaled steroids: Secondary | ICD-10-CM | POA: Diagnosis not present

## 2019-10-22 DIAGNOSIS — I110 Hypertensive heart disease with heart failure: Secondary | ICD-10-CM | POA: Diagnosis present

## 2019-10-22 DIAGNOSIS — L8990 Pressure ulcer of unspecified site, unspecified stage: Secondary | ICD-10-CM | POA: Insufficient documentation

## 2019-10-22 DIAGNOSIS — Z882 Allergy status to sulfonamides status: Secondary | ICD-10-CM | POA: Diagnosis not present

## 2019-10-22 LAB — COMPREHENSIVE METABOLIC PANEL
ALT: 13 U/L (ref 0–44)
AST: 12 U/L — ABNORMAL LOW (ref 15–41)
Albumin: 3.3 g/dL — ABNORMAL LOW (ref 3.5–5.0)
Alkaline Phosphatase: 61 U/L (ref 38–126)
Anion gap: 8 (ref 5–15)
BUN: 10 mg/dL (ref 8–23)
CO2: 34 mmol/L — ABNORMAL HIGH (ref 22–32)
Calcium: 10.2 mg/dL (ref 8.9–10.3)
Chloride: 100 mmol/L (ref 98–111)
Creatinine, Ser: 0.52 mg/dL (ref 0.44–1.00)
GFR, Estimated: >60 mL/min (ref >60)
Glucose, Bld: 128 mg/dL — ABNORMAL HIGH (ref 70–99)
Potassium: 4.4 mmol/L (ref 3.5–5.1)
Sodium: 142 mmol/L (ref 135–145)
Total Bilirubin: 0.5 mg/dL (ref 0.3–1.2)
Total Protein: 6 g/dL — ABNORMAL LOW (ref 6.5–8.1)

## 2019-10-22 LAB — CBC
HCT: 33.2 % — ABNORMAL LOW (ref 36.0–46.0)
Hemoglobin: 10.4 g/dL — ABNORMAL LOW (ref 12.0–15.0)
MCH: 32.4 pg (ref 26.0–34.0)
MCHC: 31.3 g/dL (ref 30.0–36.0)
MCV: 103.4 fL — ABNORMAL HIGH (ref 80.0–100.0)
Platelets: 197 10*3/uL (ref 150–400)
RBC: 3.21 MIL/uL — ABNORMAL LOW (ref 3.87–5.11)
RDW: 13.7 % (ref 11.5–15.5)
WBC: 8.9 10*3/uL (ref 4.0–10.5)
nRBC: 0 % (ref 0.0–0.2)

## 2019-10-22 LAB — MRSA PCR SCREENING: MRSA by PCR: POSITIVE — AB

## 2019-10-22 NOTE — Assessment & Plan Note (Addendum)
-   patient had worsening dyspnea/SOB, non-productive cough and required 4L [place] to support her O2 needs on admission - currently responding to treatment; O2 has been weaned to 2L and remains stable at this - continue steroids - hold off on abx unless develops productive cough

## 2019-10-22 NOTE — Assessment & Plan Note (Signed)
-   continue lamotrigine,quetiapine, methylphenidate, risperidone, effexor

## 2019-10-22 NOTE — Progress Notes (Signed)
PROGRESS NOTE    Julia Daniels   MPN:[id number]  DOB: 02-27-1950  DOA: 10/21/2019     0  PCP: Simona Huh, NP  CC: SOB  Hospital Course: Julia Daniels is a 69  yo CF with PMH COPD (PRN O2 at home), asthma, depression/anxiety who presented to the hospital with approximately 4 days of dyspnea. She also developed a non-productive cough and worsening DOE that she stopped walking much prior to admission. Her O2 had dropped into the 80s outpatient and EMS was contacted for hospital evaluation.  She was started on steroids, nebs, and diuresis. She required 4L [place] to support her acute on chronic hypoxic respiratory failure.    Interval History:  Seen this morning resting in bed, still feeling rather short of breath/dyspneic.  Oxygen was turned down from 4 L down to 2 L.  She is voiding well from the Lasix.  Still having ongoing cough but states it is nonproductive.  Also feels that her wheezing has improved since admission.  Old records reviewed in assessment of this patient  ROS: Constitutional: negative for chills and fevers, Respiratory: positive for cough and dyspnea on exertion, Cardiovascular: negative for chest pain and Gastrointestinal: negative for abdominal pain  Assessment & Plan: * COPD exacerbation ([place]) - patient had worsening dyspnea/SOB, non-productive cough and required 4L [place] to support her O2 needs on admission - currently responding to treatment; attempted to turn down O2 this am to 2L and will evaluate response - patient still too dyspneic/SOB for discharging home safely today - continue breathing treatment - continue steroids - hold off on abx unless develops productive cough   Acute on chronic respiratory failure with hypoxia ([place]) - on PRN O2 at home - has been on 4L O2 on admission; trial of 2L today and monitor response - see COPD  Chronic diastolic CHF (congestive heart failure) (HCC) - no obvious exacerbation however likely needs ongoing diuresis -  continue lasix - strict I&O  Chronic back pain -- continue PRN baclofen  Bipolar disorder, unspecified (HCC) - continue lamotrigine,quetiapine, methylphenidate, risperidone, effexor    Antimicrobials: None  DVT prophylaxis: Lovenox Code Status: DNR Family Communication: none present Disposition Plan: Status is: Inpatient  Remains inpatient appropriate because:IV treatments appropriate due to intensity of illness or inability to take PO and Inpatient level of care appropriate due to severity of illness   Dispo: The patient is from: SNF              Anticipated d/c is to: SNF              Anticipated d/c date is: 1 day              Patient currently is not medically stable to d/c.       Objective: Blood pressure 120/82, pulse 98, temperature 98.8 F (37.1 C), temperature source Oral, resp. rate (!) 24, weight 88 kg, SpO2 96 %.  Examination: General appearance: pleasant elderly woman laying in bed in NAD; ongoing audible cough Head: Normocephalic, without obvious abnormality, atraumatic Eyes: conjunctivae/corneas clear. PERRL, EOM's intact. Fundi benign. Lungs: decreased bibasilar BS; coarse throught, no wheezing Heart: regular rate and rhythm and S1, S2 normal Abdomen: normal findings: bowel sounds normal and soft, non-tender Skin: warm, dry, intact Extremities: 1-2+ LE edema Neurologic: Grossly normal  Consultants:   none  Procedures:   none  Data Reviewed: I have personally reviewed following labs and imaging studies Results for orders placed or performed during the hospital encounter of  10/21/19 (from the past 24 hour(s))  Blood gas, arterial     Status: Abnormal   Collection Time: 10/21/19  7:45 PM  Result Value Ref Range   pH, Arterial 7.385 7.35 - 7.45   pCO2 arterial 56.2 (H) 32 - 48 mmHg   pO2, Arterial 79.4 (L) 83 - 108 mmHg   Bicarbonate 32.9 (H) 20.0 - 28.0 mmol/L   Acid-Base Excess 6.9 (H) 0.0 - 2.0 mmol/L   O2 Saturation 96.4 %   Patient  temperature 98.5    Allens test (pass/fail) PASS PASS  MRSA PCR Screening     Status: Abnormal   Collection Time: 10/22/19  4:16 AM   Specimen: Nasopharyngeal  Result Value Ref Range   MRSA by PCR POSITIVE (A) NEGATIVE  Comprehensive metabolic panel     Status: Abnormal   Collection Time: 10/22/19  4:20 AM  Result Value Ref Range   Sodium 142 135 - 145 mmol/L   Potassium 4.4 3.5 - 5.1 mmol/L   Chloride 100 98 - 111 mmol/L   CO2 34 (H) 22 - 32 mmol/L   Glucose, Bld 128 (H) 70 - 99 mg/dL   BUN 10 8 - 23 mg/dL   Creatinine, Ser 0.52 0.44 - 1.00 mg/dL   Calcium 10.2 8.9 - 10.3 mg/dL   Total Protein 6.0 (L) 6.5 - 8.1 g/dL   Albumin 3.3 (L) 3.5 - 5.0 g/dL   AST 12 (L) 15 - 41 U/L   ALT 13 0 - 44 U/L   Alkaline Phosphatase 61 38 - 126 U/L   Total Bilirubin 0.5 0.3 - 1.2 mg/dL   GFR, Estimated >60 >60 mL/min   Anion gap 8 5 - 15  CBC     Status: Abnormal   Collection Time: 10/22/19  4:20 AM  Result Value Ref Range   WBC 8.9 4.0 - 10.5 K/uL   RBC 3.21 (L) 3.87 - 5.11 MIL/uL   Hemoglobin 10.4 (L) 12.0 - 15.0 g/dL   HCT 33.2 (L) 36 - 46 %   MCV 103.4 (H) 80.0 - 100.0 fL   MCH 32.4 26.0 - 34.0 pg   MCHC 31.3 30.0 - 36.0 g/dL   RDW 13.7 11.5 - 15.5 %   Platelets 197 150 - 400 K/uL   nRBC 0.0 0.0 - 0.2 %    Recent Results (from the past 240 hour(s))  Respiratory Panel by RT PCR (Flu A&B, Covid) - Nasopharyngeal Swab     Status: None   Collection Time: 10/21/19 10:22 AM   Specimen: Nasopharyngeal Swab  Result Value Ref Range Status   SARS Coronavirus 2 by RT PCR NEGATIVE NEGATIVE Final    Comment: (NOTE) SARS-CoV-2 target nucleic acids are NOT DETECTED.  The SARS-CoV-2 RNA is generally detectable in upper respiratoy specimens during the acute phase of infection. The lowest concentration of SARS-CoV-2 viral copies this assay can detect is 131 copies/mL. A negative result does not preclude SARS-Cov-2 infection and should not be used as the sole basis for treatment or other  patient management decisions. A negative result may occur with  improper specimen collection/handling, submission of specimen other than nasopharyngeal swab, presence of viral mutation(s) within the areas targeted by this assay, and inadequate number of viral copies (<131 copies/mL). A negative result must be combined with clinical observations, patient history, and epidemiological information. The expected result is Negative.  Fact Sheet for Patients:  PinkCheek.be  Fact Sheet for Healthcare Providers:  GravelBags.it  This test is no t yet approved or  cleared by the [place] and  has been authorized for detection and/or diagnosis of SARS-CoV-2 by FDA under an Emergency Use Authorization (EUA). This EUA will remain  in effect (meaning this test can be used) for the duration of the COVID-19 declaration under Section 564(b)(1) of the Act, 21 U.S.C. section 360bbb-3(b)(1), unless the authorization is terminated or revoked sooner.     Influenza A by PCR NEGATIVE NEGATIVE Final   Influenza B by PCR NEGATIVE NEGATIVE Final    Comment: (NOTE) The Xpert Xpress SARS-CoV-2/FLU/RSV assay is intended as an aid in  the diagnosis of influenza from Nasopharyngeal swab specimens and  should not be used as a sole basis for treatment. Nasal washings and  aspirates are unacceptable for Xpert Xpress SARS-CoV-2/FLU/RSV  testing.  Fact Sheet for Patients: PinkCheek.be  Fact Sheet for Healthcare Providers: GravelBags.it  This test is not yet approved or cleared by the [place] FDA and  has been authorized for detection and/or diagnosis of SARS-CoV-2 by  FDA under an Emergency Use Authorization (EUA). This EUA will remain  in effect (meaning this test can be used) for the duration of the  Covid-19 declaration under Section 564(b)(1) of the Act, 21  U.S.C. section  360bbb-3(b)(1), unless the authorization is  terminated or revoked. Performed at [facility], [place] [address], [place], [place] [address]   MRSA PCR Screening     Status: Abnormal   Collection Time: 10/22/19  4:16 AM   Specimen: Nasopharyngeal  Result Value Ref Range Status   MRSA by PCR POSITIVE (A) NEGATIVE Final    Comment:        The GeneXpert MRSA Assay (FDA approved for NASAL specimens only), is one component of a comprehensive MRSA colonization surveillance program. It is not intended to diagnose MRSA infection nor to guide or monitor treatment for MRSA infections. RESULT CALLED TO, READ BACK BY AND VERIFIED WITH: HOWELL,S RN @ [id number] OZ [id number] BY POTEAT,S Performed at [facility], [place] [address], [place], [place] [address]      Radiology Studies: DG Chest 2 View  Result Date: 10/21/2019 CLINICAL DATA:  Shortness of breath. Additional history provided: Increasing shortness of breath, difficulty breathing since Friday, history of COPD. EXAM: CHEST - 2 VIEW COMPARISON:  Prior chest radiographs 10/01/2019 and earlier. FINDINGS: Heart size within normal limits. There are linear opacities within the lung bases bilaterally. No evidence of pleural effusion or pneumothorax. No acute bony abnormality identified. Thoracic spondylosis. IMPRESSION: Linear opacities within the bilateral lung bases with an appearance most suggestive of linear atelectasis and/or scarring. Electronically Signed   By: Kellie Simmering DO   On: 10/21/2019 07:49   DG Chest 2 View  Final Result      Scheduled Meds:  enoxaparin (LOVENOX) injection  40 mg Subcutaneous Q24H   furosemide  40 mg Oral Daily   influenza vaccine adjuvanted  0.5 mL Intramuscular Tomorrow-1000   ipratropium-albuterol  3 mL Nebulization Q6H   lamoTRIgine  200 mg Oral QHS   methylphenidate  72 mg Oral Q breakfast   mometasone-formoterol  1 puff Inhalation BID   pantoprazole  40 mg Oral  Daily   predniSONE  40 mg Oral Q breakfast   QUEtiapine  150 mg Oral QHS   risperiDONE  3 mg Oral QHS   venlafaxine XR  150 mg Oral QHS   PRN Meds: acetaminophen **OR** acetaminophen, ALPRAZolam, baclofen, HYDROcodone-acetaminophen, ondansetron **OR** ondansetron (ZOFRAN) IV Continuous Infusions:    LOS: 0  days  Time spent: Greater than 50% of the 35 minute visit was spent in counseling/coordination of care for the patient as laid out in the A&P.   Dwyane Dee, MD [facility] 10/22/2019, 3:04 PM

## 2019-10-22 NOTE — Assessment & Plan Note (Signed)
--   continue PRN baclofen

## 2019-10-22 NOTE — Assessment & Plan Note (Addendum)
-   no obvious exacerbation however likely needs ongoing diuresis - she is diuresing well with lasix and tolerating  - continue lasix - strict I&O

## 2019-10-22 NOTE — TOC Initial Note (Signed)
Transition of Care [facility]) - Initial/Assessment Note    Patient Details  Name: Julia Daniels MRN: [id number] Date of Birth: 03-Jul-1950  Transition of Care [facility]) CM/SW Contact:    Lennart Pall, LCSW Phone Number: 10/22/2019, 1:16 PM  Clinical Narrative:                 Met with to today to review dc plans.  Pt confirms that she was admitted to [facility] on 9/28 and that her plans are to return to continue SNF rehab once she is medically cleared.  Longer term plan is to return home following rehab where she lives with her son, Julia Daniels, who can provide intermittent support.  Pt denies any concerns at this time.  Aware that I will assist with her SNF return.   Have spoken with Claiborne Billings in admissions at [facility] and will keep them informed of pt's dc readiness.    Expected Discharge Plan: Skilled Nursing Facility Barriers to Discharge: Continued Medical Work up   Patient Goals and CMS Choice Patient states their goals for this hospitalization and ongoing recovery are:: to return to SNF      Expected Discharge Plan and Services Expected Discharge Plan: [place] In-house Referral: Clinical Social Work     Living arrangements for the past 2 months: [place] (SNF since 9/28;  home with adult son prior to that)                 DME Arranged: N/A DME Agency: NA       HH Arranged: NA [place] Agency: NA        Prior Living Arrangements/Services Living arrangements for the past 2 months: [place] (SNF since 9/28;  home with adult son prior to that) Lives with:: Facility Resident Patient language and need for interpreter reviewed:: Yes Do you feel safe going back to the place where you live?: Yes   return to SNF  Need for Family Participation in Patient Care: No (Comment) Care giver support system in place?: Yes (comment)   Criminal Activity/Legal Involvement Pertinent to Current Situation/Hospitalization: No - Comment as  needed  Activities of Daily Living Home Assistive Devices/Equipment: Environmental consultant (specify type), Dentures (specify type), Shower chair with back, Bedside commode/3-in-1 (upper denture, lower denture does not fit) ADL Screening (condition at time of admission) Patient's cognitive ability adequate to safely complete daily activities?: Yes Is the patient deaf or have difficulty hearing?: No Does the patient have difficulty seeing, even when wearing glasses/contacts?: No Does the patient have difficulty concentrating, remembering, or making decisions?: Yes (a little foggy per pt) Patient able to express need for assistance with ADLs?: Yes Does the patient have difficulty dressing or bathing?: Yes Independently performs ADLs?: No Communication: Independent Dressing (OT): Needs assistance Is this a change from baseline?: Pre-admission baseline Grooming: Independent Feeding: Independent Bathing: Needs assistance Is this a change from baseline?: Pre-admission baseline Toileting: Needs assistance Is this a change from baseline?: Pre-admission baseline In/Out Bed: Needs assistance Is this a change from baseline?: Pre-admission baseline Walks in Home: Needs assistance (uses a walker) Is this a change from baseline?: Pre-admission baseline Does the patient have difficulty walking or climbing stairs?: Yes Weakness of Legs: Both Weakness of Arms/Hands: Both  Permission Sought/Granted Permission sought to share information with : Facility Art therapist granted to share information with : Yes, Verbal Permission Granted  Share Information with NAME: admission dept at [employer] granted to share info  w Relationship: son, Julia Daniels @ [phone]     Emotional Assessment Appearance:: Appears stated age Attitude/Demeanor/Rapport: Gracious, Engaged Affect (typically observed): Accepting, Pleasant Orientation: : Oriented to Place, Oriented to Self, Oriented to   Time, Oriented to Situation Alcohol / Substance Use: Not Applicable Psych Involvement: No (comment)  Admission diagnosis:  COPD exacerbation ([place]) [J44.1] Patient Active Problem List   Diagnosis Date Noted  . Pressure injury of skin 10/22/2019  . Palliative care by specialist   . Goals of care, counseling/discussion   . DNR (do not resuscitate)   . Acute respiratory failure with hypercapnia ([place]) 09/29/2019  . Drug induced constipation   . Leukocytosis   . Supplemental oxygen dependent   . Anxiety state   . Debility 09/13/2019  . Back pain 09/08/2019  . Essential hypertension 07/27/2019  . Allergic rhinitis 03/14/2018  . Acute respiratory failure with hypoxia ([place]) 03/08/2018  . Elevated MCV 01/31/2018  . Vitamin B12 deficiency 01/31/2018  . Respiratory failure, acute-on-chronic ([place]) 12/31/2017  . COPD exacerbation ([place]) 09/18/2017  . Hypoxia   . Hyperlipidemia 08/23/2017  . Depression 08/23/2017  . Anxiety 08/23/2017  . Hypercalcemia 08/23/2017  . Thrombocytosis 08/23/2017  . COPD with acute exacerbation ([place]) 08/23/2017  . ADD (attention deficit disorder) 08/30/2012  . Bipolar disorder, unspecified ([place]) 08/30/2012  . COPD  GOLD ? III with increased Eos and reversibility ? ACOS ?  08/30/2012  . Tobacco abuse 08/30/2012   PCP:  Simona Huh, NP Pharmacy:   [id number] [place], [place] - [address]Phone: [phone]: [phone] [address]edd Sias([address] [address]Phone: [phone]: [phone] [facility] [address]Phone: [phone]: [phone] [facility] [address]STigerville[address]Phone: [phone]: [phone]    Social Determinants of Health (SDOH) Interventions    Readmission Risk Interventions No flowsheet data found.

## 2019-10-22 NOTE — Assessment & Plan Note (Addendum)
-   on PRN O2 at home - stable now on 2L [place]; may need this longer term - see COPD

## 2019-10-22 NOTE — Hospital Course (Addendum)
Julia Daniels is a 69  yo CF with PMH COPD (PRN O2 at home), asthma, depression/anxiety who presented to the hospital with approximately 4 days of dyspnea. She also developed a non-productive cough and worsening DOE that she stopped walking much prior to admission. Her O2 had dropped into the 80s outpatient and EMS was contacted for hospital evaluation.  She was started on steroids, nebs, and diuresis. She required 4L [place] to support her acute on chronic hypoxic respiratory failure.  She continued to improve slowly and her oxygen was able to be weaned down to 2 L continuous nasal cannula prior to discharge.  This is essentially her home setting and can be further titrated down if able to tolerate at rehab. She was continued on her home regimen of prednisone at discharge.   She also diuresed well with a trial of Lasix.  She was continued on Lasix 20 mg daily at discharge and will need intermittent BMP to ensure for no development of renal failure or hypokalemia. She has had a recent echo on 09/09/2019 which revealed EF 55% and grade 1 diastolic dysfunction.

## 2019-10-23 LAB — MAGNESIUM: Magnesium: 2.2 mg/dL (ref 1.7–2.4)

## 2019-10-23 LAB — CBC WITH DIFFERENTIAL/PLATELET
Abs Immature Granulocytes: 0.03 10*3/uL (ref 0.00–0.07)
Basophils Absolute: 0 10*3/uL (ref 0.0–0.1)
Basophils Relative: 0 %
Eosinophils Absolute: 0 10*3/uL (ref 0.0–0.5)
Eosinophils Relative: 0 %
HCT: 34.9 % — ABNORMAL LOW (ref 36.0–46.0)
Hemoglobin: 10.6 g/dL — ABNORMAL LOW (ref 12.0–15.0)
Immature Granulocytes: 0 %
Lymphocytes Relative: 25 %
Lymphs Abs: 2.1 10*3/uL (ref 0.7–4.0)
MCH: 32 pg (ref 26.0–34.0)
MCHC: 30.4 g/dL (ref 30.0–36.0)
MCV: 105.4 fL — ABNORMAL HIGH (ref 80.0–100.0)
Monocytes Absolute: 0.7 10*3/uL (ref 0.1–1.0)
Monocytes Relative: 8 %
Neutro Abs: 5.6 10*3/uL (ref 1.7–7.7)
Neutrophils Relative %: 67 %
Platelets: 211 10*3/uL (ref 150–400)
RBC: 3.31 MIL/uL — ABNORMAL LOW (ref 3.87–5.11)
RDW: 13.7 % (ref 11.5–15.5)
WBC: 8.5 10*3/uL (ref 4.0–10.5)
nRBC: 0 % (ref 0.0–0.2)

## 2019-10-23 LAB — BASIC METABOLIC PANEL
Anion gap: 9 (ref 5–15)
BUN: 14 mg/dL (ref 8–23)
CO2: 34 mmol/L — ABNORMAL HIGH (ref 22–32)
Calcium: 10 mg/dL (ref 8.9–10.3)
Chloride: 99 mmol/L (ref 98–111)
Creatinine, Ser: 0.74 mg/dL (ref 0.44–1.00)
GFR, Estimated: >60 mL/min (ref >60)
Glucose, Bld: 98 mg/dL (ref 70–99)
Potassium: 4.1 mmol/L (ref 3.5–5.1)
Sodium: 142 mmol/L (ref 135–145)

## 2019-10-23 NOTE — Progress Notes (Signed)
PROGRESS NOTE    Julia Daniels   WCB:[id number]  DOB: 1950-08-13  DOA: 10/21/2019     1  PCP: Simona Huh, NP  CC: SOB  Hospital Course: Ms. Abid is a 69  yo CF with PMH COPD (PRN O2 at home), asthma, depression/anxiety who presented to the hospital with approximately 4 days of dyspnea. She also developed a non-productive cough and worsening DOE that she stopped walking much prior to admission. Her O2 had dropped into the 80s outpatient and EMS was contacted for hospital evaluation.  She was started on steroids, nebs, and diuresis. She required 4L [place] to support her acute on chronic hypoxic respiratory failure.    Interval History:  No events overnight.  She is resting in bed however feels more dyspneic today and is very concerned about being discharged.  She states that she has been short of breath and dyspneic when just talking on the phone to family.  She is concerned for not being able to work with rehab due to her shortness of breath if she is discharged today.  Old records reviewed in assessment of this patient  ROS: Constitutional: negative for chills and fevers, Respiratory: positive for cough and dyspnea on exertion, Cardiovascular: negative for chest pain and Gastrointestinal: negative for abdominal pain  Assessment & Plan: * COPD exacerbation ([place]) - patient had worsening dyspnea/SOB, non-productive cough and required 4L [place] to support her O2 needs on admission - currently responding to treatment; O2 has been weaned to 2L and remains stable at this - unfortunately she is still too too dyspneic/SOB for discharging safely today; tried discussing at length bedside but she feels so SOB still and if too aggressive with d/c she will likely return; on exam she has more coarse sounds with audible expiratory wheezing today - continue scheduled duonebs - continue steroids - hold off on abx unless develops productive cough  - ambulate patient and document O2 sats and  presence of dyspnea  Acute on chronic respiratory failure with hypoxia ([place]) - on PRN O2 at home - stable now on 2L [place]; may need this longer term - see COPD  Chronic diastolic CHF (congestive heart failure) ([place]) - no obvious exacerbation however likely needs ongoing diuresis - she is diuresing well with lasix and tolerating  - continue lasix - strict I&O  Chronic back pain -- continue PRN baclofen  Bipolar disorder, unspecified ([place]) - continue lamotrigine,quetiapine, methylphenidate, risperidone, effexor   Antimicrobials: None  DVT prophylaxis: Lovenox Code Status: DNR Family Communication: none present Disposition Plan: Status is: Inpatient  Remains inpatient appropriate because:IV treatments appropriate due to intensity of illness or inability to take PO and Inpatient level of care appropriate due to severity of illness   Dispo: The patient is from: SNF              Anticipated d/c is to: SNF              Anticipated d/c date is: 1 day              Patient currently is not medically stable to d/c.  Objective: Blood pressure 130/82, pulse 95, temperature 97.8 F (36.6 C), temperature source Oral, resp. rate 18, weight 88 kg, SpO2 94 %.  Examination: General appearance: pleasant elderly woman laying in bed in NAD; ongoing audible cough Head: Normocephalic, without obvious abnormality, atraumatic Eyes: conjunctivae/corneas clear. PERRL, EOM's intact. Fundi benign. Lungs: worsened coarse breath sounds now with expiratory wheezing bilaterally Heart: regular rate and rhythm  and S1, S2 normal Abdomen: normal findings: bowel sounds normal and soft, non-tender Skin: warm, dry, intact Extremities: 1-2+ LE edema Neurologic: Grossly normal  Consultants:   none  Procedures:   none  Data Reviewed: I have personally reviewed following labs and imaging studies Results for orders placed or performed during the hospital encounter of 10/21/19 (from the past 24 hour(s))   Basic metabolic panel     Status: Abnormal   Collection Time: 10/23/19  5:10 AM  Result Value Ref Range   Sodium 142 135 - 145 mmol/L   Potassium 4.1 3.5 - 5.1 mmol/L   Chloride 99 98 - 111 mmol/L   CO2 34 (H) 22 - 32 mmol/L   Glucose, Bld 98 70 - 99 mg/dL   BUN 14 8 - 23 mg/dL   Creatinine, Ser 0.74 0.44 - 1.00 mg/dL   Calcium 10.0 8.9 - 10.3 mg/dL   GFR, Estimated >60 >60 mL/min   Anion gap 9 5 - 15  CBC with Differential/Platelet     Status: Abnormal   Collection Time: 10/23/19  5:10 AM  Result Value Ref Range   WBC 8.5 4.0 - 10.5 K/uL   RBC 3.31 (L) 3.87 - 5.11 MIL/uL   Hemoglobin 10.6 (L) 12.0 - 15.0 g/dL   HCT 34.9 (L) 36 - 46 %   MCV 105.4 (H) 80.0 - 100.0 fL   MCH 32.0 26.0 - 34.0 pg   MCHC 30.4 30.0 - 36.0 g/dL   RDW 13.7 11.5 - 15.5 %   Platelets 211 150 - 400 K/uL   nRBC 0.0 0.0 - 0.2 %   Neutrophils Relative % 67 %   Neutro Abs 5.6 1.7 - 7.7 K/uL   Lymphocytes Relative 25 %   Lymphs Abs 2.1 0.7 - 4.0 K/uL   Monocytes Relative 8 %   Monocytes Absolute 0.7 0.1 - 1.0 K/uL   Eosinophils Relative 0 %   Eosinophils Absolute 0.0 0.0 - 0.5 K/uL   Basophils Relative 0 %   Basophils Absolute 0.0 0.0 - 0.1 K/uL   Immature Granulocytes 0 %   Abs Immature Granulocytes 0.03 0.00 - 0.07 K/uL  Magnesium     Status: None   Collection Time: 10/23/19  5:10 AM  Result Value Ref Range   Magnesium 2.2 1.7 - 2.4 mg/dL    Recent Results (from the past 240 hour(s))  Respiratory Panel by RT PCR (Flu A&B, Covid) - Nasopharyngeal Swab     Status: None   Collection Time: 10/21/19 10:22 AM   Specimen: Nasopharyngeal Swab  Result Value Ref Range Status   SARS Coronavirus 2 by RT PCR NEGATIVE NEGATIVE Final    Comment: (NOTE) SARS-CoV-2 target nucleic acids are NOT DETECTED.  The SARS-CoV-2 RNA is generally detectable in upper respiratoy specimens during the acute phase of infection. The lowest concentration of SARS-CoV-2 viral copies this assay can detect is 131 copies/mL. A  negative result does not preclude SARS-Cov-2 infection and should not be used as the sole basis for treatment or other patient management decisions. A negative result may occur with  improper specimen collection/handling, submission of specimen other than nasopharyngeal swab, presence of viral mutation(s) within the areas targeted by this assay, and inadequate number of viral copies (<131 copies/mL). A negative result must be combined with clinical observations, patient history, and epidemiological information. The expected result is Negative.  Fact Sheet for Patients:  PinkCheek.be  Fact Sheet for Healthcare Providers:  GravelBags.it  This test is no t yet approved  or cleared by the [place] and  has been authorized for detection and/or diagnosis of SARS-CoV-2 by FDA under an Emergency Use Authorization (EUA). This EUA will remain  in effect (meaning this test can be used) for the duration of the COVID-19 declaration under Section 564(b)(1) of the Act, 21 U.S.C. section 360bbb-3(b)(1), unless the authorization is terminated or revoked sooner.     Influenza A by PCR NEGATIVE NEGATIVE Final   Influenza B by PCR NEGATIVE NEGATIVE Final    Comment: (NOTE) The Xpert Xpress SARS-CoV-2/FLU/RSV assay is intended as an aid in  the diagnosis of influenza from Nasopharyngeal swab specimens and  should not be used as a sole basis for treatment. Nasal washings and  aspirates are unacceptable for Xpert Xpress SARS-CoV-2/FLU/RSV  testing.  Fact Sheet for Patients: PinkCheek.be  Fact Sheet for Healthcare Providers: GravelBags.it  This test is not yet approved or cleared by the [place] FDA and  has been authorized for detection and/or diagnosis of SARS-CoV-2 by  FDA under an Emergency Use Authorization (EUA). This EUA will remain  in effect (meaning this test can  be used) for the duration of the  Covid-19 declaration under Section 564(b)(1) of the Act, 21  U.S.C. section 360bbb-3(b)(1), unless the authorization is  terminated or revoked. Performed at [facility], [place] [address] [place], [place] [address]   MRSA PCR Screening     Status: Abnormal   Collection Time: 10/22/19  4:16 AM   Specimen: Nasopharyngeal  Result Value Ref Range Status   MRSA by PCR POSITIVE (A) NEGATIVE Final    Comment:        The GeneXpert MRSA Assay (FDA approved for NASAL specimens only), is one component of a comprehensive MRSA colonization surveillance program. It is not intended to diagnose MRSA infection nor to guide or monitor treatment for MRSA infections. RESULT CALLED TO, READ BACK BY AND VERIFIED WITH: HOWELL,S RN @ [id number] [id number] BY POTEAT,S Performed at [facility], [place] [address], [place], [place] [address]      Radiology Studies: No results found. DG Chest 2 View  Final Result      Scheduled Meds: . enoxaparin (LOVENOX) injection  40 mg Subcutaneous Q24H  . furosemide  40 mg Oral Daily  . ipratropium-albuterol  3 mL Nebulization Q6H  . lamoTRIgine  200 mg Oral QHS  . methylphenidate  72 mg Oral Q breakfast  . mometasone-formoterol  1 puff Inhalation BID  . pantoprazole  40 mg Oral Daily  . predniSONE  40 mg Oral Q breakfast  . QUEtiapine  150 mg Oral QHS  . risperiDONE  3 mg Oral QHS  . venlafaxine XR  150 mg Oral QHS   PRN Meds: acetaminophen **OR** acetaminophen, ALPRAZolam, baclofen, HYDROcodone-acetaminophen, ondansetron **OR** ondansetron (ZOFRAN) IV Continuous Infusions:    LOS: 1 day  Time spent: Greater than 50% of the 35 minute visit was spent in counseling/coordination of care for the patient as laid out in the A&P.   Dwyane Dee, MD [facility] 10/23/2019, 12:39 PM

## 2019-10-23 NOTE — Progress Notes (Signed)
SATURATION QUALIFICATIONS: (This note is used to comply with regulatory documentation for home oxygen)  Patient Saturations on Room Air at Rest = 95%  Patient Saturations on Room Air while Ambulating = 94%  Patient Saturations on 2 Liters of oxygen while Ambulating = 96%  Please briefly explain why patient needs home oxygen: Pt has exertional dyspnea with increase in HR 120 -144 when ambulating. At rest, HR 123. Calmer after resting a little.

## 2019-10-23 NOTE — NC FL2 (Signed)
[place] LEVEL OF CARE SCREENING TOOL     IDENTIFICATION  Patient Name: Julia Daniels Birthdate: 03/13/1950 Sex: female Admission Date (Current Location): 10/21/2019  [facility] and [place] Number:  Herbalist and Address:  [facility],  [place], [place]      Provider Number: [id number]  Attending Physician Name and Address:  Dwyane Dee, MD  Relative Name and Phone Number:  Inocente Salles [phone]    Current Level of Care: Hospital Recommended Level of Care: [place] Prior Approval Number:    Date Approved/Denied:   PASRR Number: [id number]  Discharge Plan: SNF    Current Diagnoses: Patient Active Problem List   Diagnosis Date Noted  . Pressure injury of skin 10/22/2019  . Chronic diastolic CHF (congestive heart failure) ([place]) 10/22/2019  . Chronic back pain 10/22/2019  . Palliative care by specialist   . Goals of care, counseling/discussion   . DNR (do not resuscitate)   . Acute respiratory failure with hypercapnia ([place]) 09/29/2019  . Drug induced constipation   . Leukocytosis   . Supplemental oxygen dependent   . Anxiety state   . Debility 09/13/2019  . Back pain 09/08/2019  . Essential hypertension 07/27/2019  . Allergic rhinitis 03/14/2018  . Acute respiratory failure with hypoxia ([place]) 03/08/2018  . Elevated MCV 01/31/2018  . Vitamin B12 deficiency 01/31/2018  . Acute on chronic respiratory failure with hypoxia ([place]) 01/31/2018  . Respiratory failure, acute-on-chronic ([place]) 12/31/2017  . COPD exacerbation ([place]) 09/18/2017  . Hypoxia   . Hyperlipidemia 08/23/2017  . Depression 08/23/2017  . Anxiety 08/23/2017  . Hypercalcemia 08/23/2017  . Thrombocytosis 08/23/2017  . COPD with acute exacerbation ([place]) 08/23/2017  . ADD (attention deficit disorder) 08/30/2012  . Bipolar disorder, unspecified ([place]) 08/30/2012  . COPD  GOLD ? III with increased Eos and reversibility ? ACOS ?   08/30/2012  . Tobacco abuse 08/30/2012    Orientation RESPIRATION BLADDER Height & Weight     Self, Time, Situation, Place  O2 Continent, External catheter Weight: 194 lb (88 kg) Height:     BEHAVIORAL SYMPTOMS/MOOD NEUROLOGICAL BOWEL NUTRITION STATUS      Continent    AMBULATORY STATUS COMMUNICATION OF NEEDS Skin   Limited Assist Verbally Skin abrasions, Other (Comment)                       Personal Care Assistance Level of Assistance  Bathing, Feeding, Dressing Bathing Assistance: Limited assistance Feeding assistance: Independent Dressing Assistance: Limited assistance     Functional Limitations Info  Sight, Hearing, Speech Sight Info: Adequate Hearing Info: Adequate Speech Info: Adequate    SPECIAL CARE FACTORS FREQUENCY  PT (By licensed PT), OT (By licensed OT)     PT Frequency: 5x/wk OT Frequency: 5x/wk            Contractures Contractures Info: Not present    Additional Factors Info  Code Status, Allergies, Psychotropic Code Status Info: DNR Allergies Info: see MAR Psychotropic Info: see MAR         Current Medications (10/23/2019):  This is the current hospital active medication list Current Facility-Administered Medications  Medication Dose Route Frequency Provider Last Rate Last Admin  . acetaminophen (TYLENOL) tablet 650 mg  650 mg Oral Q6H PRN Marylyn Ishihara, Tyrone A, DO       Or  . acetaminophen (TYLENOL) suppository 650 mg  650 mg Rectal Q6H PRN Marylyn Ishihara, Tyrone A, DO      .  ALPRAZolam Duanne Moron) tablet 0.25 mg  0.25 mg Oral Daily PRN Cherylann Ratel A, DO   0.25 mg at 10/22/19 2205  . baclofen (LIORESAL) tablet 10 mg  10 mg Oral TID PRN Marylyn Ishihara, Tyrone A, DO   10 mg at 10/23/19 0600  . enoxaparin (LOVENOX) injection 40 mg  40 mg Subcutaneous Q24H Kyle, Tyrone A, DO   40 mg at 10/22/19 2024  . furosemide (LASIX) tablet 40 mg  40 mg Oral Daily Kyle, Tyrone A, DO   40 mg at 10/23/19 1051  . HYDROcodone-acetaminophen (NORCO/VICODIN) 5-325 MG per tablet 1 tablet   1 tablet Oral TID PRN Veryl Speak, MD   1 tablet at 10/23/19 0600  . ipratropium-albuterol (DUONEB) 0.5-2.5 (3) MG/3ML nebulizer solution 3 mL  3 mL Nebulization Q6H Kyle, Tyrone A, DO   3 mL at 10/23/19 1351  . lamoTRIgine (LAMICTAL) tablet 200 mg  200 mg Oral QHS Kyle, Tyrone A, DO   200 mg at 10/22/19 2201  . methylphenidate (CONCERTA) CR tablet 72 mg  72 mg Oral Q breakfast Minda Ditto, RPH   72 mg at 10/23/19 0845  . mometasone-formoterol (DULERA) 200-5 MCG/ACT inhaler 1 puff  1 puff Inhalation BID Marylyn Ishihara, Tyrone A, DO   1 puff at 10/23/19 0818  . ondansetron (ZOFRAN) tablet 4 mg  4 mg Oral Q6H PRN Marylyn Ishihara, Tyrone A, DO       Or  . ondansetron (ZOFRAN) injection 4 mg  4 mg Intravenous Q6H PRN Marylyn Ishihara, Tyrone A, DO      . pantoprazole (PROTONIX) EC tablet 40 mg  40 mg Oral Daily Kyle, Tyrone A, DO   40 mg at 10/23/19 1051  . predniSONE (DELTASONE) tablet 40 mg  40 mg Oral Q breakfast Marylyn Ishihara, Tyrone A, DO   40 mg at 10/23/19 0817  . QUEtiapine (SEROQUEL) tablet 150 mg  150 mg Oral QHS Kyle, Tyrone A, DO   150 mg at 10/22/19 2201  . risperiDONE (RISPERDAL) tablet 3 mg  3 mg Oral QHS Kyle, Tyrone A, DO   3 mg at 10/22/19 2201  . venlafaxine XR (EFFEXOR-XR) 24 hr capsule 150 mg  150 mg Oral QHS Kyle, Tyrone A, DO   150 mg at 10/22/19 2201     Discharge Medications: Please see discharge summary for a list of discharge medications.  Relevant Imaging Results:  Relevant Lab Results:   Additional Information [phone]  Lennart Pall, LCSW

## 2019-10-24 DIAGNOSIS — F988 Other specified behavioral and emotional disorders with onset usually occurring in childhood and adolescence: Secondary | ICD-10-CM | POA: Diagnosis not present

## 2019-10-24 DIAGNOSIS — M4854XA Collapsed vertebra, not elsewhere classified, thoracic region, initial encounter for fracture: Secondary | ICD-10-CM | POA: Diagnosis not present

## 2019-10-24 DIAGNOSIS — Z9981 Dependence on supplemental oxygen: Secondary | ICD-10-CM | POA: Diagnosis not present

## 2019-10-24 DIAGNOSIS — J9621 Acute and chronic respiratory failure with hypoxia: Secondary | ICD-10-CM

## 2019-10-24 DIAGNOSIS — J45901 Unspecified asthma with (acute) exacerbation: Secondary | ICD-10-CM | POA: Diagnosis not present

## 2019-10-24 DIAGNOSIS — I959 Hypotension, unspecified: Secondary | ICD-10-CM | POA: Diagnosis not present

## 2019-10-24 DIAGNOSIS — J9602 Acute respiratory failure with hypercapnia: Secondary | ICD-10-CM | POA: Diagnosis not present

## 2019-10-24 DIAGNOSIS — J9601 Acute respiratory failure with hypoxia: Secondary | ICD-10-CM | POA: Diagnosis not present

## 2019-10-24 DIAGNOSIS — J441 Chronic obstructive pulmonary disease with (acute) exacerbation: Secondary | ICD-10-CM | POA: Diagnosis not present

## 2019-10-24 DIAGNOSIS — D72829 Elevated white blood cell count, unspecified: Secondary | ICD-10-CM | POA: Diagnosis present

## 2019-10-24 DIAGNOSIS — F909 Attention-deficit hyperactivity disorder, unspecified type: Secondary | ICD-10-CM | POA: Diagnosis present

## 2019-10-24 DIAGNOSIS — R000 Tachycardia, unspecified: Secondary | ICD-10-CM | POA: Diagnosis not present

## 2019-10-24 DIAGNOSIS — E538 Deficiency of other specified B group vitamins: Secondary | ICD-10-CM | POA: Diagnosis present

## 2019-10-24 DIAGNOSIS — M2550 Pain in unspecified joint: Secondary | ICD-10-CM | POA: Diagnosis not present

## 2019-10-24 DIAGNOSIS — R0989 Other specified symptoms and signs involving the circulatory and respiratory systems: Secondary | ICD-10-CM | POA: Diagnosis not present

## 2019-10-24 DIAGNOSIS — Z20822 Contact with and (suspected) exposure to covid-19: Secondary | ICD-10-CM | POA: Diagnosis not present

## 2019-10-24 DIAGNOSIS — J9620 Acute and chronic respiratory failure, unspecified whether with hypoxia or hypercapnia: Secondary | ICD-10-CM | POA: Diagnosis not present

## 2019-10-24 DIAGNOSIS — R0600 Dyspnea, unspecified: Secondary | ICD-10-CM | POA: Diagnosis not present

## 2019-10-24 DIAGNOSIS — F319 Bipolar disorder, unspecified: Secondary | ICD-10-CM | POA: Diagnosis not present

## 2019-10-24 DIAGNOSIS — I110 Hypertensive heart disease with heart failure: Secondary | ICD-10-CM | POA: Diagnosis not present

## 2019-10-24 DIAGNOSIS — K219 Gastro-esophageal reflux disease without esophagitis: Secondary | ICD-10-CM | POA: Diagnosis not present

## 2019-10-24 DIAGNOSIS — R0902 Hypoxemia: Secondary | ICD-10-CM | POA: Diagnosis not present

## 2019-10-24 DIAGNOSIS — R0602 Shortness of breath: Secondary | ICD-10-CM | POA: Diagnosis not present

## 2019-10-24 DIAGNOSIS — Z66 Do not resuscitate: Secondary | ICD-10-CM | POA: Diagnosis not present

## 2019-10-24 DIAGNOSIS — R600 Localized edema: Secondary | ICD-10-CM | POA: Diagnosis not present

## 2019-10-24 DIAGNOSIS — D649 Anemia, unspecified: Secondary | ICD-10-CM | POA: Diagnosis not present

## 2019-10-24 DIAGNOSIS — I5040 Unspecified combined systolic (congestive) and diastolic (congestive) heart failure: Secondary | ICD-10-CM | POA: Diagnosis not present

## 2019-10-24 DIAGNOSIS — Z90711 Acquired absence of uterus with remaining cervical stump: Secondary | ICD-10-CM | POA: Diagnosis not present

## 2019-10-24 DIAGNOSIS — R5381 Other malaise: Secondary | ICD-10-CM | POA: Diagnosis not present

## 2019-10-24 DIAGNOSIS — I2699 Other pulmonary embolism without acute cor pulmonale: Secondary | ICD-10-CM | POA: Diagnosis not present

## 2019-10-24 DIAGNOSIS — J309 Allergic rhinitis, unspecified: Secondary | ICD-10-CM | POA: Diagnosis not present

## 2019-10-24 DIAGNOSIS — F339 Major depressive disorder, recurrent, unspecified: Secondary | ICD-10-CM | POA: Diagnosis not present

## 2019-10-24 DIAGNOSIS — I5032 Chronic diastolic (congestive) heart failure: Secondary | ICD-10-CM | POA: Diagnosis not present

## 2019-10-24 DIAGNOSIS — F419 Anxiety disorder, unspecified: Secondary | ICD-10-CM | POA: Diagnosis not present

## 2019-10-24 DIAGNOSIS — J9622 Acute and chronic respiratory failure with hypercapnia: Secondary | ICD-10-CM | POA: Diagnosis not present

## 2019-10-24 DIAGNOSIS — I10 Essential (primary) hypertension: Secondary | ICD-10-CM | POA: Diagnosis not present

## 2019-10-24 DIAGNOSIS — R2243 Localized swelling, mass and lump, lower limb, bilateral: Secondary | ICD-10-CM | POA: Diagnosis not present

## 2019-10-24 DIAGNOSIS — F3170 Bipolar disorder, currently in remission, most recent episode unspecified: Secondary | ICD-10-CM | POA: Diagnosis not present

## 2019-10-24 DIAGNOSIS — J449 Chronic obstructive pulmonary disease, unspecified: Secondary | ICD-10-CM | POA: Diagnosis not present

## 2019-10-24 DIAGNOSIS — E785 Hyperlipidemia, unspecified: Secondary | ICD-10-CM | POA: Diagnosis not present

## 2019-10-24 DIAGNOSIS — M6281 Muscle weakness (generalized): Secondary | ICD-10-CM | POA: Diagnosis not present

## 2019-10-24 DIAGNOSIS — J9811 Atelectasis: Secondary | ICD-10-CM | POA: Diagnosis not present

## 2019-10-24 DIAGNOSIS — R069 Unspecified abnormalities of breathing: Secondary | ICD-10-CM | POA: Diagnosis not present

## 2019-10-24 DIAGNOSIS — G8929 Other chronic pain: Secondary | ICD-10-CM | POA: Diagnosis not present

## 2019-10-24 DIAGNOSIS — R0689 Other abnormalities of breathing: Secondary | ICD-10-CM | POA: Diagnosis not present

## 2019-10-24 DIAGNOSIS — Z7401 Bed confinement status: Secondary | ICD-10-CM | POA: Diagnosis not present

## 2019-10-24 DIAGNOSIS — G894 Chronic pain syndrome: Secondary | ICD-10-CM | POA: Diagnosis not present

## 2019-10-24 DIAGNOSIS — J9690 Respiratory failure, unspecified, unspecified whether with hypoxia or hypercapnia: Secondary | ICD-10-CM | POA: Diagnosis not present

## 2019-10-24 LAB — BASIC METABOLIC PANEL
Anion gap: 8 (ref 5–15)
BUN: 14 mg/dL (ref 8–23)
CO2: 35 mmol/L — ABNORMAL HIGH (ref 22–32)
Calcium: 9.8 mg/dL (ref 8.9–10.3)
Chloride: 99 mmol/L (ref 98–111)
Creatinine, Ser: 0.58 mg/dL (ref 0.44–1.00)
GFR, Estimated: >60 mL/min (ref >60)
Glucose, Bld: 106 mg/dL — ABNORMAL HIGH (ref 70–99)
Potassium: 3.9 mmol/L (ref 3.5–5.1)
Sodium: 142 mmol/L (ref 135–145)

## 2019-10-24 LAB — CBC WITH DIFFERENTIAL/PLATELET
Abs Immature Granulocytes: 0.05 10*3/uL (ref 0.00–0.07)
Basophils Absolute: 0 10*3/uL (ref 0.0–0.1)
Basophils Relative: 0 %
Eosinophils Absolute: 0 10*3/uL (ref 0.0–0.5)
Eosinophils Relative: 0 %
HCT: 35.2 % — ABNORMAL LOW (ref 36.0–46.0)
Hemoglobin: 10.7 g/dL — ABNORMAL LOW (ref 12.0–15.0)
Immature Granulocytes: 1 %
Lymphocytes Relative: 26 %
Lymphs Abs: 2.2 10*3/uL (ref 0.7–4.0)
MCH: 31.8 pg (ref 26.0–34.0)
MCHC: 30.4 g/dL (ref 30.0–36.0)
MCV: 104.5 fL — ABNORMAL HIGH (ref 80.0–100.0)
Monocytes Absolute: 0.7 10*3/uL (ref 0.1–1.0)
Monocytes Relative: 9 %
Neutro Abs: 5.5 10*3/uL (ref 1.7–7.7)
Neutrophils Relative %: 64 %
Platelets: 219 10*3/uL (ref 150–400)
RBC: 3.37 MIL/uL — ABNORMAL LOW (ref 3.87–5.11)
RDW: 13.7 % (ref 11.5–15.5)
WBC: 8.6 10*3/uL (ref 4.0–10.5)
nRBC: 0 % (ref 0.0–0.2)

## 2019-10-24 LAB — MAGNESIUM: Magnesium: 2 mg/dL (ref 1.7–2.4)

## 2019-10-24 LAB — SARS CORONAVIRUS 2 BY RT PCR (HOSPITAL ORDER, PERFORMED IN ~~LOC~~ HOSPITAL LAB): SARS Coronavirus 2: NEGATIVE

## 2019-10-24 MED ORDER — FUROSEMIDE 40 MG PO TABS
20.0000 mg | ORAL_TABLET | Freq: Every day | ORAL | Status: DC
Start: 2019-10-25 — End: 2019-11-10

## 2019-10-24 NOTE — Plan of Care (Signed)
Report was given to [place] at [facility]. [phone]. DC instructions were placed in package for receiving facility. Patient was transported by [facility].

## 2019-10-24 NOTE — Discharge Summary (Signed)
Physician Discharge Summary   Julia Daniels VFI:[id number] DOB: 02-01-1950 DOA: 10/21/2019  PCP: Simona Huh, NP  Admit date: 10/21/2019 Discharge date: 10/24/2019  Admitted From: SNF Disposition:  SNF Discharging physician: Dwyane Dee, MD  Recommendations for Outpatient Follow-up:  1. O2 was 2L at discharge 2. Check BMP intermittently as patient started on lasix 20 mg daily   Patient discharged to SNF in Discharge Condition: stable CODE STATUS: DNR Diet recommendation:  Diet Orders (From admission, onward)    Start     Ordered   10/24/19 0000  Diet general        10/24/19 1122   10/21/19 1634  Diet regular Room service appropriate? Yes; Fluid consistency: Thin  Diet effective now       Question Answer Comment  Room service appropriate? Yes   Fluid consistency: Thin      10/21/19 1633          Hospital Course: Ms. Elms is a 69  yo CF with PMH COPD (PRN O2 at home), asthma, depression/anxiety who presented to the hospital with approximately 4 days of dyspnea. She also developed a non-productive cough and worsening DOE that she stopped walking much prior to admission. Her O2 had dropped into the 80s outpatient and EMS was contacted for hospital evaluation.  She was started on steroids, nebs, and diuresis. She required 4L [place] to support her acute on chronic hypoxic respiratory failure.  She continued to improve slowly and her oxygen was able to be weaned down to 2 L continuous nasal cannula prior to discharge.  This is essentially her home setting and can be further titrated down if able to tolerate at rehab. She was continued on her home regimen of prednisone at discharge.   She also diuresed well with a trial of Lasix.  She was continued on Lasix 20 mg daily at discharge and will need intermittent BMP to ensure for no development of renal failure or hypokalemia. She has had a recent echo on 09/09/2019 which revealed EF 55% and grade 1 diastolic  dysfunction.   * COPD exacerbation ([place]) - patient had worsening dyspnea/SOB, non-productive cough and required 4L [place] to support her O2 needs on admission - currently responding to treatment; O2 has been weaned to 2L and remains stable at this - continue steroids - hold off on abx unless develops productive cough   Acute on chronic respiratory failure with hypoxia ([place]) - on PRN O2 at home - stable now on 2L [place]; may need this longer term - see COPD  Chronic diastolic CHF (congestive heart failure) (HCC) - no obvious exacerbation however likely needs ongoing diuresis - she is diuresing well with lasix and tolerating  - continue lasix - strict I&O  Chronic back pain -- continue PRN baclofen  Bipolar disorder, unspecified (HCC) - continue lamotrigine,quetiapine, methylphenidate, risperidone, effexor    Principal Diagnosis: COPD exacerbation ([place])  Discharge Diagnoses: Active Hospital Problems   Diagnosis Date Noted  . COPD exacerbation ([place]) 09/18/2017    Priority: High  . Acute on chronic respiratory failure with hypoxia ([place]) 01/31/2018    Priority: Medium  . Chronic diastolic CHF (congestive heart failure) ([place]) 10/22/2019    Priority: Low  . Pressure injury of skin 10/22/2019  . Chronic back pain 10/22/2019  . Hyperlipidemia 08/23/2017  . Bipolar disorder, unspecified ([place]) 08/30/2012    Resolved Hospital Problems  No resolved problems to display.    Discharge Instructions    Diet general   Complete by: As directed  Discharge wound care:   Complete by: As directed    Stage 2 buttock ulcer, continue with turning in bed and working with PT out of bed   Increase activity slowly   Complete by: As directed    Oxygenation Goals:   Complete by: As directed    SpO2>88%     Allergies as of 10/24/2019      Reactions   Sulfa Antibiotics Other (See Comments)   Mouth gets raw      Medication List    STOP taking these medications   AeroChamber MV inhaler    EPINEPHrine 0.3 mg/0.3 mL Soaj injection Commonly known as: EPI-PEN   Fasenra 30 MG/ML Sosy Generic drug: Benralizumab   OXYGEN     TAKE these medications   albuterol 108 (90 Base) MCG/ACT inhaler Commonly known as: ProAir HFA Inhale 2 puffs into the lungs every 6 (six) hours as needed for wheezing or shortness of breath.   albuterol (2.5 MG/3ML) 0.083% nebulizer solution Commonly known as: PROVENTIL Take 3 mLs (2.5 mg total) by nebulization every 6 (six) hours as needed for wheezing or shortness of breath.   ALPRAZolam 0.25 MG tablet Commonly known as: XANAX Take 1 tablet (0.25 mg total) by mouth daily as needed for anxiety. What changed: when to take this   atorvastatin 40 MG tablet Commonly known as: Lipitor Take 1 tablet (40 mg total) by mouth daily. What changed: when to take this   baclofen 10 MG tablet Commonly known as: LIORESAL Take 10 mg by mouth 3 (three) times daily.   budesonide-formoterol 160-4.5 MCG/ACT inhaler Commonly known as: Symbicort INHALE 2 PUFFS INTO LUNGS TWICE A DAY What changed:   how much to take  how to take this  when to take this  additional instructions   famotidine 20 MG tablet Commonly known as: PEPCID One at bedtime What changed:   how much to take  how to take this  when to take this  additional instructions   furosemide 40 MG tablet Commonly known as: LASIX Take 0.5 tablets (20 mg total) by mouth daily. Start taking on: October 25, 2019   guaiFENesin 600 MG 12 hr tablet Commonly known as: MUCINEX Take 1 tablet (600 mg total) by mouth 2 (two) times daily. What changed: how much to take   HYDROcodone-acetaminophen 5-325 MG tablet Commonly known as: NORCO/VICODIN Take 1 tablet by mouth 3 (three) times daily as needed for moderate pain. What changed: when to take this   ibuprofen 800 MG tablet Commonly known as: ADVIL Take 800 mg by mouth 2 (two) times daily as needed for pain.   lamoTRIgine 200 MG  tablet Commonly known as: LAMICTAL Take 200 mg by mouth at bedtime.   lidocaine 5 % Commonly known as: LIDODERM Place 1 patch onto the skin daily. Remove & Discard patch within 12 hours or as directed by MD   loratadine 10 MG tablet Commonly known as: CLARITIN Take 10 mg by mouth every evening.   Methylphenidate HCl ER 72 MG Tbcr Take 72 mg by mouth daily.   montelukast 10 MG tablet Commonly known as: SINGULAIR Take 1 tablet (10 mg total) by mouth at bedtime.   pantoprazole 40 MG tablet Commonly known as: Protonix Take 1 tablet (40 mg total) by mouth daily. Take 30-60 min before first meal of the day   predniSONE 20 MG tablet Commonly known as: DELTASONE Take 40 mg by mouth daily with breakfast.   QUEtiapine 100 MG tablet Commonly known as:  SEROQUEL Take 150 mg by mouth at bedtime.   risperiDONE 3 MG tablet Commonly known as: RISPERDAL Take 3 mg by mouth at bedtime.   senna-docusate 8.6-50 MG tablet Commonly known as: Senokot-S Take 1 tablet by mouth 2 (two) times daily.   Spiriva Respimat 2.5 MCG/ACT Aers Generic drug: Tiotropium Bromide Monohydrate Inhale 2 puffs into the lungs daily.   venlafaxine XR 150 MG 24 hr capsule Commonly known as: EFFEXOR-XR Take 150 mg by mouth at bedtime.   Vitamin D (Ergocalciferol) 1.25 MG (50000 UNIT) Caps capsule Commonly known as: DRISDOL Take 50,000 Units by mouth every Saturday.            Discharge Care Instructions  (From admission, onward)         Start     Ordered   10/24/19 0000  Discharge wound care:       Comments: Stage 2 buttock ulcer, continue with turning in bed and working with PT out of bed   10/24/19 1122          Contact information for after-discharge care    Destination    [facility] Preferred SNF .   Service: Skilled Nursing Contact information: [address] [address] [phone]                 Allergies  Allergen Reactions  . Sulfa  Antibiotics Other (See Comments)    Mouth gets raw    Consultations: none  Discharge Exam: BP 129/74 (BP Location: Right Arm)   Pulse 95   Temp (!) 97.4 F (36.3 C) (Oral)   Resp 17   Wt 88 kg   SpO2 94%   BMI 34.37 kg/m  General appearance: pleasant elderly woman laying in bed in NAD; appears much more comfortable today Head: Normocephalic, without obvious abnormality, atraumatic Eyes: conjunctivae/corneas clear. PERRL, EOM's intact. Fundi benign. Lungs:  Chronic coarse breath sounds bilaterally with improved wheezing, this is likely baseline Heart: regular rate and rhythm and S1, S2 normal Abdomen: normal findings: bowel sounds normal and soft, non-tender Skin: warm, dry, intact Extremities: 1-2+ LE edema Neurologic: Grossly normal  The results of significant diagnostics from this hospitalization (including imaging, microbiology, ancillary and laboratory) are listed below for reference.   Microbiology: Recent Results (from the past 240 hour(s))  Respiratory Panel by RT PCR (Flu A&B, Covid) - Nasopharyngeal Swab     Status: None   Collection Time: 10/21/19 10:22 AM   Specimen: Nasopharyngeal Swab  Result Value Ref Range Status   SARS Coronavirus 2 by RT PCR NEGATIVE NEGATIVE Final    Comment: (NOTE) SARS-CoV-2 target nucleic acids are NOT DETECTED.  The SARS-CoV-2 RNA is generally detectable in upper respiratoy specimens during the acute phase of infection. The lowest concentration of SARS-CoV-2 viral copies this assay can detect is 131 copies/mL. A negative result does not preclude SARS-Cov-2 infection and should not be used as the sole basis for treatment or other patient management decisions. A negative result may occur with  improper specimen collection/handling, submission of specimen other than nasopharyngeal swab, presence of viral mutation(s) within the areas targeted by this assay, and inadequate number of viral copies (<131 copies/mL). A negative result must  be combined with clinical observations, patient history, and epidemiological information. The expected result is Negative.  Fact Sheet for Patients:  PinkCheek.be  Fact Sheet for Healthcare Providers:  GravelBags.it  This test is no t yet approved or cleared by the [place] FDA and  has been  authorized for detection and/or diagnosis of SARS-CoV-2 by FDA under an Emergency Use Authorization (EUA). This EUA will remain  in effect (meaning this test can be used) for the duration of the COVID-19 declaration under Section 564(b)(1) of the Act, 21 U.S.C. section 360bbb-3(b)(1), unless the authorization is terminated or revoked sooner.     Influenza A by PCR NEGATIVE NEGATIVE Final   Influenza B by PCR NEGATIVE NEGATIVE Final    Comment: (NOTE) The Xpert Xpress SARS-CoV-2/FLU/RSV assay is intended as an aid in  the diagnosis of influenza from Nasopharyngeal swab specimens and  should not be used as a sole basis for treatment. Nasal washings and  aspirates are unacceptable for Xpert Xpress SARS-CoV-2/FLU/RSV  testing.  Fact Sheet for Patients: PinkCheek.be  Fact Sheet for Healthcare Providers: GravelBags.it  This test is not yet approved or cleared by the [place] FDA and  has been authorized for detection and/or diagnosis of SARS-CoV-2 by  FDA under an Emergency Use Authorization (EUA). This EUA will remain  in effect (meaning this test can be used) for the duration of the  Covid-19 declaration under Section 564(b)(1) of the Act, 21  U.S.C. section 360bbb-3(b)(1), unless the authorization is  terminated or revoked. Performed at [facility], [place] [address] [place], [place] [address]   MRSA PCR Screening     Status: Abnormal   Collection Time: 10/22/19  4:16 AM   Specimen: Nasopharyngeal  Result Value Ref Range Status   MRSA by PCR  POSITIVE (A) NEGATIVE Final    Comment:        The GeneXpert MRSA Assay (FDA approved for NASAL specimens only), is one component of a comprehensive MRSA colonization surveillance program. It is not intended to diagnose MRSA infection nor to guide or monitor treatment for MRSA infections. RESULT CALLED TO, READ BACK BY AND VERIFIED WITH: HOWELL,S RN @ [phone] XQ [id number] BY POTEAT,S Performed at [facility], [place] [address], [place], [place] [address]      Labs: BNP (last 3 results) Recent Labs    09/28/19 1256 10/21/19 0941  BNP 176.8* 48.1   Basic Metabolic Panel: Recent Labs  Lab 10/21/19 0941 10/22/19 0420 10/23/19 0510 10/24/19 0518  NA 139 142 142 142  K 3.9 4.4 4.1 3.9  CL 100 100 99 99  CO2 29 34* 34* 35*  GLUCOSE 114* 128* 98 106*  BUN 8 10 14 14   CREATININE 0.58 0.52 0.74 0.58  CALCIUM 9.7 10.2 10.0 9.8  MG  --   --  2.2 2.0   Liver Function Tests: Recent Labs  Lab 10/21/19 0941 10/22/19 0420  AST 10* 12*  ALT 12 13  ALKPHOS 71 61  BILITOT 0.6 0.5  PROT 6.4* 6.0*  ALBUMIN 3.5 3.3*   No results for input(s): LIPASE, AMYLASE in the last 168 hours. No results for input(s): AMMONIA in the last 168 hours. CBC: Recent Labs  Lab 10/21/19 0941 10/22/19 0420 10/23/19 0510 10/24/19 0518  WBC 9.7 8.9 8.5 8.6  NEUTROABS 7.5  --  5.6 5.5  HGB 11.3* 10.4* 10.6* 10.7*  HCT 37.2 33.2* 34.9* 35.2*  MCV 106.3* 103.4* 105.4* 104.5*  PLT 199 197 211 219   Cardiac Enzymes: No results for input(s): CKTOTAL, CKMB, CKMBINDEX, TROPONINI in the last 168 hours. BNP: Invalid input(s): POCBNP CBG: No results for input(s): GLUCAP in the last 168 hours. D-Dimer No results for input(s): DDIMER in the last 72 hours. Hgb A1c No results for input(s): HGBA1C in  the last 72 hours. Lipid Profile No results for input(s): CHOL, HDL, LDLCALC, TRIG, CHOLHDL, LDLDIRECT in the last 72 hours. Thyroid function studies No results for input(s): TSH, T4TOTAL,  T3FREE, THYROIDAB in the last 72 hours.  Invalid input(s): FREET3 Anemia work up No results for input(s): VITAMINB12, FOLATE, FERRITIN, TIBC, IRON, RETICCTPCT in the last 72 hours. Urinalysis    Component Value Date/Time   COLORURINE YELLOW 09/10/2019 1302   APPEARANCEUR CLEAR 09/10/2019 1302   LABSPEC 1.018 09/10/2019 1302   PHURINE 5.0 09/10/2019 1302   GLUCOSEU NEGATIVE 09/10/2019 1302   HGBUR NEGATIVE 09/10/2019 1302   BILIRUBINUR NEGATIVE 09/10/2019 1302   BILIRUBINUR n 09/23/2012 1111   KETONESUR NEGATIVE 09/10/2019 1302   PROTEINUR NEGATIVE 09/10/2019 1302   UROBILINOGEN 0.2 09/23/2012 1111   NITRITE NEGATIVE 09/10/2019 1302   LEUKOCYTESUR NEGATIVE 09/10/2019 1302   Sepsis Labs Invalid input(s): PROCALCITONIN,  WBC,  LACTICIDVEN Microbiology Recent Results (from the past 240 hour(s))  Respiratory Panel by RT PCR (Flu A&B, Covid) - Nasopharyngeal Swab     Status: None   Collection Time: 10/21/19 10:22 AM   Specimen: Nasopharyngeal Swab  Result Value Ref Range Status   SARS Coronavirus 2 by RT PCR NEGATIVE NEGATIVE Final    Comment: (NOTE) SARS-CoV-2 target nucleic acids are NOT DETECTED.  The SARS-CoV-2 RNA is generally detectable in upper respiratoy specimens during the acute phase of infection. The lowest concentration of SARS-CoV-2 viral copies this assay can detect is 131 copies/mL. A negative result does not preclude SARS-Cov-2 infection and should not be used as the sole basis for treatment or other patient management decisions. A negative result may occur with  improper specimen collection/handling, submission of specimen other than nasopharyngeal swab, presence of viral mutation(s) within the areas targeted by this assay, and inadequate number of viral copies (<131 copies/mL). A negative result must be combined with clinical observations, patient history, and epidemiological information. The expected result is Negative.  Fact Sheet for Patients:   PinkCheek.be  Fact Sheet for Healthcare Providers:  GravelBags.it  This test is no t yet approved or cleared by the [place] FDA and  has been authorized for detection and/or diagnosis of SARS-CoV-2 by FDA under an Emergency Use Authorization (EUA). This EUA will remain  in effect (meaning this test can be used) for the duration of the COVID-19 declaration under Section 564(b)(1) of the Act, 21 U.S.C. section 360bbb-3(b)(1), unless the authorization is terminated or revoked sooner.     Influenza A by PCR NEGATIVE NEGATIVE Final   Influenza B by PCR NEGATIVE NEGATIVE Final    Comment: (NOTE) The Xpert Xpress SARS-CoV-2/FLU/RSV assay is intended as an aid in  the diagnosis of influenza from Nasopharyngeal swab specimens and  should not be used as a sole basis for treatment. Nasal washings and  aspirates are unacceptable for Xpert Xpress SARS-CoV-2/FLU/RSV  testing.  Fact Sheet for Patients: PinkCheek.be  Fact Sheet for Healthcare Providers: GravelBags.it  This test is not yet approved or cleared by the [place] FDA and  has been authorized for detection and/or diagnosis of SARS-CoV-2 by  FDA under an Emergency Use Authorization (EUA). This EUA will remain  in effect (meaning this test can be used) for the duration of the  Covid-19 declaration under Section 564(b)(1) of the Act, 21  U.S.C. section 360bbb-3(b)(1), unless the authorization is  terminated or revoked. Performed at [facility], [place] [address] [place], [place] [address]   MRSA PCR Screening  Status: Abnormal   Collection Time: 10/22/19  4:16 AM   Specimen: Nasopharyngeal  Result Value Ref Range Status   MRSA by PCR POSITIVE (A) NEGATIVE Final    Comment:        The GeneXpert MRSA Assay (FDA approved for NASAL specimens only), is one component of a comprehensive  MRSA colonization surveillance program. It is not intended to diagnose MRSA infection nor to guide or monitor treatment for MRSA infections. RESULT CALLED TO, READ BACK BY AND VERIFIED WITH: HOWELL,S RN @ [id number] BY POTEAT,S Performed at [facility], [place] [address], [place], [place] [address]     Procedures/Studies: DG Chest 2 View  Result Date: 10/21/2019 CLINICAL DATA:  Shortness of breath. Additional history provided: Increasing shortness of breath, difficulty breathing since Friday, history of COPD. EXAM: CHEST - 2 VIEW COMPARISON:  Prior chest radiographs 10/01/2019 and earlier. FINDINGS: Heart size within normal limits. There are linear opacities within the lung bases bilaterally. No evidence of pleural effusion or pneumothorax. No acute bony abnormality identified. Thoracic spondylosis. IMPRESSION: Linear opacities within the bilateral lung bases with an appearance most suggestive of linear atelectasis and/or scarring. Electronically Signed   By: Kellie Simmering DO   On: 10/21/2019 07:49   DG Chest Port 1 View  Result Date: 10/01/2019 CLINICAL DATA:  Normal respiration. EXAM: PORTABLE CHEST 1 VIEW COMPARISON:  September 30, 2019. FINDINGS: Stable cardiomediastinal silhouette. No pneumothorax or pleural effusion is noted. Stable bibasilar subsegmental atelectasis or infiltrates are noted. The visualized skeletal structures are unremarkable. IMPRESSION: Stable bibasilar subsegmental atelectasis or infiltrates. Electronically Signed   By: Marijo Conception M.D.   On: 10/01/2019 08:01   DG Chest Port 1 View  Result Date: 09/30/2019 CLINICAL DATA:  Respiratory distress.  Patient on BiPAP machine. EXAM: PORTABLE CHEST 1 VIEW COMPARISON:  09/28/2019 FINDINGS: Lungs are hypoinflated demonstrate persistent bibasilar/infrahilar linear opacification likely atelectasis although infection is possible. No effusion. Cardiomediastinal silhouette and remainder of the exam is  unchanged. IMPRESSION: Hypoinflation with stable bibasilar/infrahilar linear opacification likely atelectasis, although infection is possible. Electronically Signed   By: Marin Olp M.D.   On: 09/30/2019 09:30   DG CHEST PORT 1 VIEW  Result Date: 09/28/2019 CLINICAL DATA:  Shortness of breath EXAM: PORTABLE CHEST 1 VIEW COMPARISON:  September 25, 2019 FINDINGS: The cardiomediastinal silhouette is unchanged and enlarged in contour. No pleural effusion. No pneumothorax. There are scattered bibasilar linear opacities, mildly increased in comparison to prior. Unchanged linear opacity of the LEFT upper lobe. Emphysema. Visualized abdomen is unremarkable. Multilevel degenerative changes of the thoracic spine. Compression fracture of the midthoracic spine, unchanged. IMPRESSION: Scattered bibasilar linear opacities, mildly increased in comparison to prior. These are favored to reflect increasing atelectasis. Electronically Signed   By: Valentino Saxon MD   On: 09/28/2019 10:42   DG CHEST PORT 1 VIEW  Result Date: 09/25/2019 CLINICAL DATA:  Shortness of breath. EXAM: PORTABLE CHEST 1 VIEW COMPARISON:  09/08/2019 FINDINGS: The heart is mildly enlarged but stable. There is tortuosity and mild calcification of the thoracic aorta. No infiltrates or effusions. Streaky basilar scarring changes. IMPRESSION: No acute cardiopulmonary findings. Electronically Signed   By: Marijo Sanes M.D.   On: 09/25/2019 06:38     Time coordinating discharge: Over 30 minutes    Dwyane Dee, MD  [facility] 10/24/2019, 11:23 AM

## 2019-10-24 NOTE — TOC Transition Note (Signed)
Transition of Care [facility]) - CM/SW Discharge Note   Patient Details  Name: KATHARINE ROCHEFORT MRN: [id number] Date of Birth: 09-Aug-1950  Transition of [facility]) CM/SW Contact:  Lennart Pall, LCSW Phone Number: 10/24/2019, 1:19 PM   Clinical Narrative:    Pt medically cleared for return to SNF today.  Pt agreeable and have arranged PTAR for transport back to [place].  No further TOC needs.  Final next level of care: Skilled Nursing Facility Barriers to Discharge: Barriers Resolved   Patient Goals and CMS Choice Patient states their goals for this hospitalization and ongoing recovery are:: to return to SNF      Discharge Placement   Existing PASRR number confirmed : 10/22/19          Patient chooses bed at: [facility] Patient to be transferred to facility by: [place] Name of family member notified: [name] Patient and family notified of of transfer: 10/24/19  Discharge Plan and Services In-house Referral: Clinical Social Work              DME Arranged: N/A DME Agency: NA       HH Arranged: NA HH Agency: NA        Social Determinants of Health (SDOH) Interventions     Readmission Risk Interventions No flowsheet data found.

## 2019-10-27 DIAGNOSIS — J9621 Acute and chronic respiratory failure with hypoxia: Secondary | ICD-10-CM | POA: Diagnosis not present

## 2019-10-27 DIAGNOSIS — J9622 Acute and chronic respiratory failure with hypercapnia: Secondary | ICD-10-CM | POA: Diagnosis not present

## 2019-10-27 DIAGNOSIS — R0989 Other specified symptoms and signs involving the circulatory and respiratory systems: Secondary | ICD-10-CM | POA: Diagnosis not present

## 2019-10-27 DIAGNOSIS — J441 Chronic obstructive pulmonary disease with (acute) exacerbation: Secondary | ICD-10-CM | POA: Diagnosis not present

## 2019-10-28 DIAGNOSIS — G894 Chronic pain syndrome: Secondary | ICD-10-CM | POA: Diagnosis not present

## 2019-10-28 DIAGNOSIS — R0600 Dyspnea, unspecified: Secondary | ICD-10-CM | POA: Diagnosis not present

## 2019-10-28 DIAGNOSIS — R600 Localized edema: Secondary | ICD-10-CM | POA: Diagnosis not present

## 2019-10-28 DIAGNOSIS — J441 Chronic obstructive pulmonary disease with (acute) exacerbation: Secondary | ICD-10-CM | POA: Diagnosis not present

## 2019-10-30 DIAGNOSIS — R600 Localized edema: Secondary | ICD-10-CM | POA: Diagnosis not present

## 2019-10-30 DIAGNOSIS — J441 Chronic obstructive pulmonary disease with (acute) exacerbation: Secondary | ICD-10-CM | POA: Diagnosis not present

## 2019-10-30 DIAGNOSIS — R0600 Dyspnea, unspecified: Secondary | ICD-10-CM | POA: Diagnosis not present

## 2019-10-31 DIAGNOSIS — G894 Chronic pain syndrome: Secondary | ICD-10-CM | POA: Diagnosis not present

## 2019-10-31 DIAGNOSIS — F3170 Bipolar disorder, currently in remission, most recent episode unspecified: Secondary | ICD-10-CM | POA: Diagnosis not present

## 2019-10-31 DIAGNOSIS — I5032 Chronic diastolic (congestive) heart failure: Secondary | ICD-10-CM | POA: Diagnosis not present

## 2019-10-31 DIAGNOSIS — J449 Chronic obstructive pulmonary disease, unspecified: Secondary | ICD-10-CM | POA: Diagnosis not present

## 2019-11-04 ENCOUNTER — Inpatient Hospital Stay (HOSPITAL_COMMUNITY): Payer: BC Managed Care – PPO

## 2019-11-04 ENCOUNTER — Emergency Department (HOSPITAL_COMMUNITY): Payer: BC Managed Care – PPO

## 2019-11-04 ENCOUNTER — Inpatient Hospital Stay (HOSPITAL_COMMUNITY)
Admission: EM | Admit: 2019-11-04 | Discharge: 2019-11-10 | DRG: 190 | Disposition: A | Discharge status: Skilled Nursing Facility | Payer: BC Managed Care – PPO | Source: Other Acute Inpatient Hospital | Attending: Internal Medicine | Admitting: Internal Medicine

## 2019-11-04 ENCOUNTER — Encounter (HOSPITAL_COMMUNITY): Payer: Self-pay | Admitting: Internal Medicine

## 2019-11-04 DIAGNOSIS — Z9981 Dependence on supplemental oxygen: Secondary | ICD-10-CM | POA: Diagnosis not present

## 2019-11-04 DIAGNOSIS — I5032 Chronic diastolic (congestive) heart failure: Secondary | ICD-10-CM | POA: Diagnosis not present

## 2019-11-04 DIAGNOSIS — Z90711 Acquired absence of uterus with remaining cervical stump: Secondary | ICD-10-CM | POA: Diagnosis not present

## 2019-11-04 DIAGNOSIS — F419 Anxiety disorder, unspecified: Secondary | ICD-10-CM | POA: Diagnosis present

## 2019-11-04 DIAGNOSIS — I110 Hypertensive heart disease with heart failure: Secondary | ICD-10-CM | POA: Diagnosis not present

## 2019-11-04 DIAGNOSIS — F319 Bipolar disorder, unspecified: Secondary | ICD-10-CM | POA: Diagnosis present

## 2019-11-04 DIAGNOSIS — Z66 Do not resuscitate: Secondary | ICD-10-CM | POA: Diagnosis not present

## 2019-11-04 DIAGNOSIS — E538 Deficiency of other specified B group vitamins: Secondary | ICD-10-CM | POA: Diagnosis present

## 2019-11-04 DIAGNOSIS — F909 Attention-deficit hyperactivity disorder, unspecified type: Secondary | ICD-10-CM | POA: Diagnosis present

## 2019-11-04 DIAGNOSIS — J9690 Respiratory failure, unspecified, unspecified whether with hypoxia or hypercapnia: Secondary | ICD-10-CM | POA: Diagnosis not present

## 2019-11-04 DIAGNOSIS — I2699 Other pulmonary embolism without acute cor pulmonale: Secondary | ICD-10-CM | POA: Diagnosis present

## 2019-11-04 DIAGNOSIS — R0689 Other abnormalities of breathing: Secondary | ICD-10-CM | POA: Diagnosis not present

## 2019-11-04 DIAGNOSIS — I5040 Unspecified combined systolic (congestive) and diastolic (congestive) heart failure: Secondary | ICD-10-CM | POA: Diagnosis not present

## 2019-11-04 DIAGNOSIS — R5381 Other malaise: Secondary | ICD-10-CM | POA: Diagnosis not present

## 2019-11-04 DIAGNOSIS — D649 Anemia, unspecified: Secondary | ICD-10-CM | POA: Diagnosis not present

## 2019-11-04 DIAGNOSIS — K219 Gastro-esophageal reflux disease without esophagitis: Secondary | ICD-10-CM | POA: Diagnosis not present

## 2019-11-04 DIAGNOSIS — J9811 Atelectasis: Secondary | ICD-10-CM | POA: Diagnosis not present

## 2019-11-04 DIAGNOSIS — Z20822 Contact with and (suspected) exposure to covid-19: Secondary | ICD-10-CM | POA: Diagnosis present

## 2019-11-04 DIAGNOSIS — Z7951 Long term (current) use of inhaled steroids: Secondary | ICD-10-CM

## 2019-11-04 DIAGNOSIS — G8929 Other chronic pain: Secondary | ICD-10-CM | POA: Diagnosis present

## 2019-11-04 DIAGNOSIS — D72829 Elevated white blood cell count, unspecified: Secondary | ICD-10-CM | POA: Diagnosis present

## 2019-11-04 DIAGNOSIS — J441 Chronic obstructive pulmonary disease with (acute) exacerbation: Principal | ICD-10-CM | POA: Diagnosis present

## 2019-11-04 DIAGNOSIS — Z882 Allergy status to sulfonamides status: Secondary | ICD-10-CM

## 2019-11-04 DIAGNOSIS — Z825 Family history of asthma and other chronic lower respiratory diseases: Secondary | ICD-10-CM

## 2019-11-04 DIAGNOSIS — J9621 Acute and chronic respiratory failure with hypoxia: Secondary | ICD-10-CM | POA: Diagnosis not present

## 2019-11-04 DIAGNOSIS — R0602 Shortness of breath: Secondary | ICD-10-CM | POA: Diagnosis not present

## 2019-11-04 DIAGNOSIS — Z7401 Bed confinement status: Secondary | ICD-10-CM | POA: Diagnosis not present

## 2019-11-04 DIAGNOSIS — E785 Hyperlipidemia, unspecified: Secondary | ICD-10-CM | POA: Diagnosis present

## 2019-11-04 DIAGNOSIS — Z6835 Body mass index (BMI) 35.0-35.9, adult: Secondary | ICD-10-CM

## 2019-11-04 DIAGNOSIS — Z79899 Other long term (current) drug therapy: Secondary | ICD-10-CM

## 2019-11-04 DIAGNOSIS — R000 Tachycardia, unspecified: Secondary | ICD-10-CM | POA: Diagnosis present

## 2019-11-04 DIAGNOSIS — Z7952 Long term (current) use of systemic steroids: Secondary | ICD-10-CM

## 2019-11-04 DIAGNOSIS — M4854XA Collapsed vertebra, not elsewhere classified, thoracic region, initial encounter for fracture: Secondary | ICD-10-CM | POA: Diagnosis present

## 2019-11-04 DIAGNOSIS — R069 Unspecified abnormalities of breathing: Secondary | ICD-10-CM | POA: Diagnosis not present

## 2019-11-04 DIAGNOSIS — I959 Hypotension, unspecified: Secondary | ICD-10-CM | POA: Diagnosis present

## 2019-11-04 DIAGNOSIS — Z87891 Personal history of nicotine dependence: Secondary | ICD-10-CM

## 2019-11-04 DIAGNOSIS — J309 Allergic rhinitis, unspecified: Secondary | ICD-10-CM | POA: Diagnosis not present

## 2019-11-04 DIAGNOSIS — R0902 Hypoxemia: Secondary | ICD-10-CM | POA: Diagnosis not present

## 2019-11-04 DIAGNOSIS — M2550 Pain in unspecified joint: Secondary | ICD-10-CM | POA: Diagnosis not present

## 2019-11-04 DIAGNOSIS — J9691 Respiratory failure, unspecified with hypoxia: Secondary | ICD-10-CM | POA: Diagnosis present

## 2019-11-04 LAB — CBC WITH DIFFERENTIAL/PLATELET
Abs Immature Granulocytes: 0.24 10*3/uL — ABNORMAL HIGH (ref 0.00–0.07)
Basophils Absolute: 0.1 10*3/uL (ref 0.0–0.1)
Basophils Relative: 0 %
Eosinophils Absolute: 0 10*3/uL (ref 0.0–0.5)
Eosinophils Relative: 0 %
HCT: 39.5 % (ref 36.0–46.0)
Hemoglobin: 11.9 g/dL — ABNORMAL LOW (ref 12.0–15.0)
Immature Granulocytes: 2 %
Lymphocytes Relative: 14 %
Lymphs Abs: 1.7 10*3/uL (ref 0.7–4.0)
MCH: 31.9 pg (ref 26.0–34.0)
MCHC: 30.1 g/dL (ref 30.0–36.0)
MCV: 105.9 fL — ABNORMAL HIGH (ref 80.0–100.0)
Monocytes Absolute: 0.6 10*3/uL (ref 0.1–1.0)
Monocytes Relative: 5 %
Neutro Abs: 9.8 10*3/uL — ABNORMAL HIGH (ref 1.7–7.7)
Neutrophils Relative %: 79 %
Platelets: 314 10*3/uL (ref 150–400)
RBC: 3.73 MIL/uL — ABNORMAL LOW (ref 3.87–5.11)
RDW: 13.8 % (ref 11.5–15.5)
WBC: 12.4 10*3/uL — ABNORMAL HIGH (ref 4.0–10.5)
nRBC: 0 % (ref 0.0–0.2)

## 2019-11-04 LAB — MRSA PCR SCREENING: MRSA by PCR: NEGATIVE

## 2019-11-04 LAB — ECHOCARDIOGRAM COMPLETE
Area-P 1/2: 5.66 cm2
Calc EF: 49.3 %
Height: 63 in
S' Lateral: 2.9 cm
Single Plane A2C EF: 49.5 %
Single Plane A4C EF: 47 %
Weight: 3088 oz

## 2019-11-04 LAB — COMPREHENSIVE METABOLIC PANEL
ALT: 14 U/L (ref 0–44)
AST: 11 U/L — ABNORMAL LOW (ref 15–41)
Albumin: 3.8 g/dL (ref 3.5–5.0)
Alkaline Phosphatase: 61 U/L (ref 38–126)
Anion gap: 10 (ref 5–15)
BUN: 12 mg/dL (ref 8–23)
CO2: 33 mmol/L — ABNORMAL HIGH (ref 22–32)
Calcium: 9.8 mg/dL (ref 8.9–10.3)
Chloride: 101 mmol/L (ref 98–111)
Creatinine, Ser: 0.66 mg/dL (ref 0.44–1.00)
GFR, Estimated: >60 mL/min (ref >60)
Glucose, Bld: 114 mg/dL — ABNORMAL HIGH (ref 70–99)
Potassium: 3.9 mmol/L (ref 3.5–5.1)
Sodium: 144 mmol/L (ref 135–145)
Total Bilirubin: 0.5 mg/dL (ref 0.3–1.2)
Total Protein: 6.2 g/dL — ABNORMAL LOW (ref 6.5–8.1)

## 2019-11-04 LAB — IRON AND TIBC
Iron: 90 ug/dL (ref 28–170)
Saturation Ratios: 28 % (ref 10.4–31.8)
TIBC: 317 ug/dL (ref 250–450)
UIBC: 227 ug/dL

## 2019-11-04 LAB — RETICULOCYTES
Immature Retic Fract: 11.7 % (ref 2.3–15.9)
RBC.: 3.89 MIL/uL (ref 3.87–5.11)
Retic Count, Absolute: 86.7 10*3/uL (ref 19.0–186.0)
Retic Ct Pct: 2.2 % (ref 0.4–3.1)

## 2019-11-04 LAB — T4, FREE: Free T4: 0.97 ng/dL (ref 0.61–1.12)

## 2019-11-04 LAB — VITAMIN B12: Vitamin B-12: 133 pg/mL — ABNORMAL LOW (ref 180–914)

## 2019-11-04 LAB — FOLATE: Folate: 11.9 ng/mL (ref >5.9)

## 2019-11-04 LAB — TROPONIN I (HIGH SENSITIVITY): Troponin I (High Sensitivity): 8 ng/L (ref <18)

## 2019-11-04 LAB — RESPIRATORY PANEL BY RT PCR (FLU A&B, COVID)
Influenza A by PCR: NEGATIVE
Influenza B by PCR: NEGATIVE
SARS Coronavirus 2 by RT PCR: NEGATIVE

## 2019-11-04 LAB — FERRITIN: Ferritin: 44 ng/mL (ref 11–307)

## 2019-11-04 LAB — TSH: TSH: 0.968 u[IU]/mL (ref 0.350–4.500)

## 2019-11-04 LAB — BRAIN NATRIURETIC PEPTIDE: B Natriuretic Peptide: 45.4 pg/mL (ref 0.0–100.0)

## 2019-11-04 MED ORDER — ALBUTEROL SULFATE HFA 108 (90 BASE) MCG/ACT IN AERS: 2.0000 | INHALATION_SPRAY | Freq: Four times a day (QID) | RESPIRATORY_TRACT | Status: DC | PRN

## 2019-11-04 MED ORDER — PANTOPRAZOLE SODIUM 40 MG IV SOLR
40.0000 mg | INTRAVENOUS | Status: DC
  Administered 2019-11-04 – 2019-11-05 (×2): 40 mg via INTRAVENOUS
  Filled 2019-11-04 (×2): qty 40

## 2019-11-04 MED ORDER — METHYLPREDNISOLONE 4 MG PO TBPK
8.0000 mg | ORAL_TABLET | Freq: Every evening | ORAL | Status: AC
  Administered 2019-11-04: 8 mg via ORAL

## 2019-11-04 MED ORDER — METHYLPREDNISOLONE 4 MG PO TBPK
4.0000 mg | ORAL_TABLET | ORAL | Status: AC
  Administered 2019-11-04: 4 mg via ORAL

## 2019-11-04 MED ORDER — SODIUM CHLORIDE 0.9 % IV SOLN: 250.0000 mL | INTRAVENOUS | Status: DC | PRN

## 2019-11-04 MED ORDER — HYDROCODONE-ACETAMINOPHEN 5-325 MG PO TABS
1.0000 | ORAL_TABLET | Freq: Three times a day (TID) | ORAL | Status: DC | PRN
  Administered 2019-11-04 – 2019-11-10 (×14): 1 via ORAL
  Filled 2019-11-04 (×14): qty 1

## 2019-11-04 MED ORDER — RISPERIDONE 1 MG PO TABS
3.0000 mg | ORAL_TABLET | Freq: Every day | ORAL | Status: DC
  Administered 2019-11-04 – 2019-11-10 (×7): 3 mg via ORAL
  Filled 2019-11-04 (×7): qty 3

## 2019-11-04 MED ORDER — METHYLPREDNISOLONE 4 MG PO TBPK: 4.0000 mg | ORAL_TABLET | Freq: Four times a day (QID) | ORAL | Status: DC

## 2019-11-04 MED ORDER — SODIUM CHLORIDE 0.9% FLUSH: 3.0000 mL | INTRAVENOUS | Status: DC | PRN

## 2019-11-04 MED ORDER — ASPIRIN EC 81 MG PO TBEC
81.0000 mg | DELAYED_RELEASE_TABLET | Freq: Every day | ORAL | Status: DC
  Administered 2019-11-04 – 2019-11-06 (×3): 81 mg via ORAL
  Filled 2019-11-04 (×3): qty 1

## 2019-11-04 MED ORDER — METHYLPHENIDATE HCL ER (OSM) 18 MG PO TBCR
72.0000 mg | EXTENDED_RELEASE_TABLET | Freq: Every day | ORAL | Status: DC
  Administered 2019-11-05 – 2019-11-10 (×6): 72 mg via ORAL
  Filled 2019-11-04 (×6): qty 4

## 2019-11-04 MED ORDER — UMECLIDINIUM BROMIDE 62.5 MCG/INH IN AEPB
1.0000 | INHALATION_SPRAY | Freq: Every day | RESPIRATORY_TRACT | Status: DC
  Filled 2019-11-04: qty 7

## 2019-11-04 MED ORDER — LAMOTRIGINE 100 MG PO TABS
200.0000 mg | ORAL_TABLET | Freq: Every day | ORAL | Status: DC
  Administered 2019-11-04 – 2019-11-10 (×7): 200 mg via ORAL
  Filled 2019-11-04 (×7): qty 2

## 2019-11-04 MED ORDER — CHLORHEXIDINE GLUCONATE CLOTH 2 % EX PADS
6.0000 | MEDICATED_PAD | Freq: Every day | CUTANEOUS | Status: DC
  Administered 2019-11-04 – 2019-11-05 (×2): 6 via TOPICAL

## 2019-11-04 MED ORDER — BARRIER CREAM NON-SPECIFIED: 1.0000 "application " | TOPICAL_CREAM | Freq: Every day | TOPICAL | Status: DC | PRN

## 2019-11-04 MED ORDER — LISINOPRIL 5 MG PO TABS
2.5000 mg | ORAL_TABLET | Freq: Every day | ORAL | Status: DC
  Administered 2019-11-04 – 2019-11-07 (×4): 2.5 mg via ORAL
  Filled 2019-11-04 (×4): qty 1

## 2019-11-04 MED ORDER — SODIUM CHLORIDE (PF) 0.9 % IJ SOLN
INTRAMUSCULAR | Status: AC
  Administered 2019-11-04: 3 mL via INTRAVENOUS
  Filled 2019-11-04: qty 50

## 2019-11-04 MED ORDER — TIOTROPIUM BROMIDE MONOHYDRATE 2.5 MCG/ACT IN AERS: 2.0000 | INHALATION_SPRAY | Freq: Every day | RESPIRATORY_TRACT | Status: DC

## 2019-11-04 MED ORDER — MONTELUKAST SODIUM 10 MG PO TABS
10.0000 mg | ORAL_TABLET | Freq: Every day | ORAL | Status: DC
  Administered 2019-11-04 – 2019-11-10 (×7): 10 mg via ORAL
  Filled 2019-11-04 (×8): qty 1

## 2019-11-04 MED ORDER — IOHEXOL 350 MG/ML SOLN
100.0000 mL | Freq: Once | INTRAVENOUS | Status: AC | PRN
  Administered 2019-11-04: 100 mL via INTRAVENOUS

## 2019-11-04 MED ORDER — METHYLPREDNISOLONE SODIUM SUCC 125 MG IJ SOLR
60.0000 mg | Freq: Two times a day (BID) | INTRAMUSCULAR | Status: DC
  Administered 2019-11-04: 60 mg via INTRAVENOUS
  Filled 2019-11-04: qty 2

## 2019-11-04 MED ORDER — VENLAFAXINE HCL ER 150 MG PO CP24
150.0000 mg | ORAL_CAPSULE | Freq: Every day | ORAL | Status: DC
  Administered 2019-11-04 – 2019-11-10 (×7): 150 mg via ORAL
  Filled 2019-11-04 (×8): qty 1

## 2019-11-04 MED ORDER — ONDANSETRON HCL 4 MG/2ML IJ SOLN: 4.0000 mg | Freq: Four times a day (QID) | INTRAMUSCULAR | Status: DC | PRN

## 2019-11-04 MED ORDER — DERMACLOUD EX OINT: 1.0000 "application " | TOPICAL_OINTMENT | Freq: Every day | CUTANEOUS | Status: DC | PRN

## 2019-11-04 MED ORDER — METHYLPREDNISOLONE 4 MG PO TBPK: 8.0000 mg | ORAL_TABLET | Freq: Every evening | ORAL | Status: DC

## 2019-11-04 MED ORDER — ENOXAPARIN SODIUM 80 MG/0.8ML ~~LOC~~ SOLN
70.0000 mg | Freq: Two times a day (BID) | SUBCUTANEOUS | Status: DC
  Administered 2019-11-04 – 2019-11-05 (×2): 70 mg via SUBCUTANEOUS
  Filled 2019-11-04 (×2): qty 0.7

## 2019-11-04 MED ORDER — ALBUTEROL SULFATE HFA 108 (90 BASE) MCG/ACT IN AERS
4.0000 | INHALATION_SPRAY | RESPIRATORY_TRACT | Status: DC | PRN
  Administered 2019-11-04 – 2019-11-06 (×3): 4 via RESPIRATORY_TRACT
  Filled 2019-11-04 (×2): qty 6.7

## 2019-11-04 MED ORDER — ORAL CARE MOUTH RINSE
15.0000 mL | Freq: Two times a day (BID) | OROMUCOSAL | Status: DC
  Administered 2019-11-04 – 2019-11-10 (×10): 15 mL via OROMUCOSAL

## 2019-11-04 MED ORDER — ALPRAZOLAM 0.25 MG PO TABS
0.2500 mg | ORAL_TABLET | Freq: Every day | ORAL | Status: DC | PRN
  Administered 2019-11-04 – 2019-11-06 (×3): 0.25 mg via ORAL
  Filled 2019-11-04 (×3): qty 1

## 2019-11-04 MED ORDER — PERFLUTREN LIPID MICROSPHERE
1.0000 mL | INTRAVENOUS | Status: AC | PRN
  Administered 2019-11-04: 2 mL via INTRAVENOUS
  Filled 2019-11-04: qty 10

## 2019-11-04 MED ORDER — HEPARIN SODIUM (PORCINE) 5000 UNIT/ML IJ SOLN
5000.0000 [IU] | Freq: Three times a day (TID) | INTRAMUSCULAR | Status: DC
  Administered 2019-11-04: 5000 [IU] via SUBCUTANEOUS
  Filled 2019-11-04: qty 1

## 2019-11-04 MED ORDER — METHYLPREDNISOLONE 4 MG PO TBPK
8.0000 mg | ORAL_TABLET | Freq: Every morning | ORAL | Status: AC
  Administered 2019-11-04: 8 mg via ORAL
  Filled 2019-11-04: qty 21

## 2019-11-04 MED ORDER — SODIUM CHLORIDE 0.9% FLUSH
3.0000 mL | Freq: Two times a day (BID) | INTRAVENOUS | Status: DC
  Administered 2019-11-04 – 2019-11-10 (×10): 3 mL via INTRAVENOUS

## 2019-11-04 MED ORDER — METHYLPREDNISOLONE 4 MG PO TBPK: 4.0000 mg | ORAL_TABLET | Freq: Three times a day (TID) | ORAL | Status: DC

## 2019-11-04 MED ORDER — ATORVASTATIN CALCIUM 40 MG PO TABS
40.0000 mg | ORAL_TABLET | Freq: Every day | ORAL | Status: DC
  Administered 2019-11-04 – 2019-11-10 (×7): 40 mg via ORAL
  Filled 2019-11-04 (×7): qty 1

## 2019-11-04 MED ORDER — ACETAMINOPHEN 325 MG PO TABS
650.0000 mg | ORAL_TABLET | ORAL | Status: DC | PRN
  Administered 2019-11-07 (×2): 650 mg via ORAL
  Filled 2019-11-04 (×2): qty 2

## 2019-11-04 MED ORDER — QUETIAPINE FUMARATE 50 MG PO TABS
150.0000 mg | ORAL_TABLET | Freq: Every day | ORAL | Status: DC
  Administered 2019-11-04 – 2019-11-10 (×7): 150 mg via ORAL
  Filled 2019-11-04 (×7): qty 1

## 2019-11-04 MED ORDER — LABETALOL HCL 5 MG/ML IV SOLN
10.0000 mg | INTRAVENOUS | Status: AC | PRN
  Administered 2019-11-04 (×3): 10 mg via INTRAVENOUS
  Filled 2019-11-04 (×2): qty 4

## 2019-11-04 MED ORDER — LORATADINE 10 MG PO TABS
10.0000 mg | ORAL_TABLET | Freq: Every evening | ORAL | Status: DC
  Administered 2019-11-04 – 2019-11-09 (×6): 10 mg via ORAL
  Filled 2019-11-04 (×6): qty 1

## 2019-11-04 MED ORDER — ALBUTEROL SULFATE (2.5 MG/3ML) 0.083% IN NEBU
2.5000 mg | INHALATION_SOLUTION | Freq: Four times a day (QID) | RESPIRATORY_TRACT | Status: DC | PRN
  Filled 2019-11-04: qty 3

## 2019-11-04 MED ORDER — FLUTICASONE FUROATE-VILANTEROL 200-25 MCG/INH IN AEPB
1.0000 | INHALATION_SPRAY | Freq: Every day | RESPIRATORY_TRACT | Status: DC
  Filled 2019-11-04: qty 28

## 2019-11-04 NOTE — ED Notes (Signed)
Meal provided to patient

## 2019-11-04 NOTE — ED Provider Notes (Signed)
Julia Daniels DEPT Provider Note   CSN: [id number] Arrival date & time: 11/04/19  4097     History Chief Complaint  Patient presents with   Shortness of Breath    Julia Daniels is a 69 y.o. female.  HPI Patient presents with shortness of breath.  Worse this morning although has been dwindling for the last few days.  Is at a rehab center and is on 4 L of oxygen constantly for her COPD.  Occasional cough but no sputum production.  Seen by EMS and reportedly started on nonrebreather.  Unknown what pulse ox was however.  Had been given albuterol Atrovent Solu-Medrol and magnesium.  Patient states she does feel little better.  Has some swelling in the legs but states is actually improved from where she has been.  No chest pain.  No fevers.  Has had her Covid vaccines.    Past Medical History:  Diagnosis Date   Anxiety    Asthma    COPD (chronic obstructive pulmonary disease) ([place])    Depression     Patient Active Problem List   Diagnosis Date Noted   Pressure injury of skin 10/22/2019   Chronic diastolic CHF (congestive heart failure) ([place]) 10/22/2019   Chronic back pain 10/22/2019   Palliative care by specialist    Goals of care, counseling/discussion    DNR (do not resuscitate)    Acute respiratory failure with hypercapnia ([place]) 09/29/2019   Drug induced constipation    Leukocytosis    Supplemental oxygen dependent    Anxiety state    Debility 09/13/2019   Back pain 09/08/2019   Essential hypertension 07/27/2019   Allergic rhinitis 03/14/2018   Acute respiratory failure with hypoxia ([place]) 03/08/2018   Elevated MCV 01/31/2018   Vitamin B12 deficiency 01/31/2018   Acute on chronic respiratory failure with hypoxia ([place]) 01/31/2018   Respiratory failure, acute-on-chronic ([place]) 12/31/2017   COPD exacerbation ([place]) 09/18/2017   Hypoxia    Hyperlipidemia 08/23/2017   Depression 08/23/2017   Anxiety 08/23/2017    Hypercalcemia 08/23/2017   Thrombocytosis 08/23/2017   COPD with acute exacerbation ([place]) 08/23/2017   ADD (attention deficit disorder) 08/30/2012   Bipolar disorder, unspecified ([place]) 08/30/2012   COPD  GOLD ? III with increased Eos and reversibility ? ACOS ?  08/30/2012   Tobacco abuse 08/30/2012    Past Surgical History:  Procedure Laterality Date   ABDOMINAL HYSTERECTOMY     partial      OB History   No obstetric history on file.     Family History  Problem Relation Age of Onset   Asthma Mother    Lung cancer Father    COPD Father     Social History   Tobacco Use   Smoking status: Former Smoker    Packs/day: 1.00    Years: 40.00    Pack years: 40.00    Types: Cigarettes    Quit date: 08/23/2017    Years since quitting: 2.2   Smokeless tobacco: Never Used  Substance Use Topics   Alcohol use: No   Drug use: No    Home Medications Prior to Admission medications   Medication Sig Start Date End Date Taking? Authorizing Provider  albuterol (PROAIR HFA) 108 (90 Base) MCG/ACT inhaler Inhale 2 puffs into the lungs every 6 (six) hours as needed for wheezing or shortness of breath. 07/25/19  Yes Tanda Rockers, MD  albuterol (PROVENTIL) (2.5 MG/3ML) 0.083% nebulizer solution Take 3 mLs (2.5 mg total) by  nebulization every 6 (six) hours as needed for wheezing or shortness of breath. 09/10/19  Yes Tanda Rockers, MD  ALPRAZolam Duanne Moron) 0.25 MG tablet Take 1 tablet (0.25 mg total) by mouth daily as needed for anxiety. Patient taking differently: Take 0.25 mg by mouth at bedtime.  10/07/19  Yes Aline August, MD  atorvastatin (LIPITOR) 40 MG tablet Take 1 tablet (40 mg total) by mouth daily. Patient taking differently: Take 40 mg by mouth at bedtime.  09/12/19 11/04/19 Yes Florencia Reasons, MD  baclofen (LIORESAL) 10 MG tablet Take 10 mg by mouth 3 (three) times daily.  09/05/19  Yes [provider]  budesonide-formoterol (SYMBICORT) 160-4.5 MCG/ACT inhaler  INHALE 2 PUFFS INTO LUNGS TWICE A DAY Patient taking differently: Inhale 2 puffs into the lungs in the morning and at bedtime.  08/26/19  Yes Tanda Rockers, MD  famotidine (PEPCID) 20 MG tablet One at bedtime Patient taking differently: Take 20 mg by mouth at bedtime.  07/25/19  Yes Tanda Rockers, MD  guaiFENesin (MUCINEX) 600 MG 12 hr tablet Take 1 tablet (600 mg total) by mouth 2 (two) times daily. Patient taking differently: Take 1,200 mg by mouth 2 (two) times daily.  09/12/19  Yes Florencia Reasons, MD  HYDROcodone-acetaminophen (NORCO/VICODIN) 5-325 MG tablet Take 1 tablet by mouth 3 (three) times daily as needed for moderate pain. Patient taking differently: Take 1 tablet by mouth in the morning, at noon, and at bedtime.  10/07/19  Yes Aline August, MD  ibuprofen (ADVIL) 800 MG tablet Take 800 mg by mouth 2 (two) times daily as needed for pain. 08/25/19  Yes [provider]  [facility]) OINT Apply 1 application topically daily as needed (rash).   Yes [provider]  lamoTRIgine (LAMICTAL) 200 MG tablet Take 200 mg by mouth at bedtime.    Yes [provider]  lidocaine (LIDODERM) 5 % Place 1 patch onto the skin daily. Remove & Discard patch within 12 hours or as directed by MD 09/12/19  Yes Florencia Reasons, MD  loratadine (CLARITIN) 10 MG tablet Take 10 mg by mouth every evening.    Yes [provider]  Methylphenidate HCl ER 72 MG TBCR Take 72 mg by mouth daily.   Yes [provider]  montelukast (SINGULAIR) 10 MG tablet Take 1 tablet (10 mg total) by mouth at bedtime. 04/17/19  Yes Tanda Rockers, MD  pantoprazole (PROTONIX) 40 MG tablet Take 1 tablet (40 mg total) by mouth daily. Take 30-60 min before first meal of the day 07/25/19  Yes Tanda Rockers, MD  predniSONE (DELTASONE) 20 MG tablet Take 20 mg by mouth daily with breakfast.    Yes [provider]  QUEtiapine (SEROQUEL) 100 MG tablet Take 150 mg by mouth at bedtime. 08/18/19   Yes [provider]  risperiDONE (RISPERDAL) 3 MG tablet Take 3 mg by mouth at bedtime.   Yes [provider]  senna-docusate (SENOKOT-S) 8.6-50 MG tablet Take 1 tablet by mouth 2 (two) times daily. 09/12/19  Yes Florencia Reasons, MD  Tiotropium Bromide Monohydrate (SPIRIVA RESPIMAT) 2.5 MCG/ACT AERS Inhale 2 puffs into the lungs daily. 08/29/19  Yes Tanda Rockers, MD  venlafaxine XR (EFFEXOR-XR) 150 MG 24 hr capsule Take 150 mg by mouth at bedtime.    Yes [provider]  Vitamin D, Ergocalciferol, (DRISDOL) 50000 units CAPS capsule Take 50,000 Units by mouth every Saturday.  06/18/17  Yes [provider]  furosemide (LASIX) 40 MG  tablet Take 0.5 tablets (20 mg total) by mouth daily. Patient not taking: Reported on 11/04/2019 10/25/19   Dwyane Dee, MD    Allergies    Sulfa antibiotics  Review of Systems   Review of Systems  Constitutional: Negative for appetite change.  HENT: Negative for congestion.   Respiratory: Positive for cough and shortness of breath.   Cardiovascular: Positive for leg swelling.  Gastrointestinal: Negative for abdominal pain.  Genitourinary: Negative for flank pain.  Musculoskeletal: Positive for back pain.  Skin: Negative for rash.  Neurological: Negative for weakness.  Psychiatric/Behavioral: Negative for confusion.    Physical Exam Updated Vital Signs BP (!) 178/105 (BP Location: Left Arm)    Pulse 100    Temp 98.1 F (36.7 C)    Resp (!) 24    Ht 5\' 3"  (1.6 m)    Wt 87.5 kg    SpO2 93%    BMI 34.19 kg/m   Physical Exam Vitals and nursing note reviewed.  HENT:     Head: Normocephalic.  Cardiovascular:     Rate and Rhythm: Regular rhythm. Tachycardia present.  Pulmonary:     Comments: Mild diffuse wheezes and some prolonged expirations.  No focal rales. Chest:     Chest wall: No tenderness.  Abdominal:     Tenderness: There is no abdominal tenderness.  Musculoskeletal:     Cervical back: Neck supple.     Right  lower leg: Edema present.     Left lower leg: Edema present.     Comments: Pitting edema bilateral lower extremities.  Skin:    General: Skin is warm.     Capillary Refill: Capillary refill takes less than 2 seconds.  Neurological:     Mental Status: She is alert and oriented to person, place, and time.  Psychiatric:        Mood and Affect: Mood normal.     ED Results / Procedures / Treatments   Labs (all labs ordered are listed, but only abnormal results are displayed) Labs Reviewed  COMPREHENSIVE METABOLIC PANEL - Abnormal; Notable for the following components:      Result Value   CO2 33 (*)    Glucose, Bld 114 (*)    Total Protein 6.2 (*)    AST 11 (*)    All other components within normal limits  CBC WITH DIFFERENTIAL/PLATELET - Abnormal; Notable for the following components:   WBC 12.4 (*)    RBC 3.73 (*)    Hemoglobin 11.9 (*)    MCV 105.9 (*)    Neutro Abs 9.8 (*)    Abs Immature Granulocytes 0.24 (*)    All other components within normal limits  RESPIRATORY PANEL BY RT PCR (FLU A&B, COVID)  BRAIN NATRIURETIC PEPTIDE  TROPONIN I (HIGH SENSITIVITY)    EKG EKG Interpretation  Date/Time:  Tuesday November 04 2019 06:56:38 EDT Ventricular Rate:  108 PR Interval:    QRS Duration: 90 QT Interval:  327 QTC Calculation: 439 R Axis:   27 Text Interpretation: Sinus tachycardia Nonspecific T abnrm, anterolateral leads Confirmed by Davonna Belling [phone]) on 11/04/2019 8:01:58 AM   Radiology DG Chest Portable 1 View  Result Date: 11/04/2019 CLINICAL DATA:  Shortness of breath EXAM: PORTABLE CHEST 1 VIEW COMPARISON:  October 21, 2019 FINDINGS: There is bibasilar atelectasis. Lungs elsewhere are clear. Heart size and pulmonary vascularity are normal. No adenopathy. No bone lesions. IMPRESSION: Bibasilar atelectasis. Lungs elsewhere clear. Heart size within normal limits. Electronically Signed   By: Gwyndolyn Saxon  Jasmine December III M.D.   On: 11/04/2019 07:53     Procedures Procedures (including critical care time)  Medications Ordered in ED Medications  albuterol (VENTOLIN HFA) 108 (90 Base) MCG/ACT inhaler 4 puff (4 puffs Inhalation Given 11/04/19 1438)    ED Course  I have reviewed the triage vital signs and the nursing notes.  Pertinent labs & imaging results that were available during my care of the patient were reviewed by me and considered in my medical decision making (see chart for details).    MDM Rules/Calculators/A&P                          Patient with shortness of breath.  On baseline oxygen for COPD.  Now requiring nonrebreather.  Becomes more dyspneic with the oxygen off but did not have a desat.  X-ray reassuring.  Sounds tight.  Has been given breathing treatments Solu-Medrol and magnesium.  With continued oxygen requirement will admit to hospitalist. Final Clinical Impression(s) / ED Diagnoses Final diagnoses:  COPD exacerbation [facility])    Rx / DC Orders ED Discharge Orders    None       Davonna Belling, MD 11/04/19 1125

## 2019-11-04 NOTE — ED Notes (Signed)
Attempted to wean patient off of NRB, placed on 6L [place], O2 was at 92-94%, however, patient's work of breathing increased and pt endorsed worsening SOB. Pt placed back on NRB at 15L.

## 2019-11-04 NOTE — ED Notes (Signed)
Hospitalist contacted for PRN blood pressure medication

## 2019-11-04 NOTE — Progress Notes (Signed)
[place] for Lovenox Indication: pulmonary embolus  Allergies  Allergen Reactions  . Sulfa Antibiotics Other (See Comments)    Mouth gets raw   Patient Measurements: Height: 5\' 3"  (160 cm) Weight: 87.5 kg (193 lb) IBW/kg (Calculated) : 52.4 Heparin Dosing Weight: 72kg  Vital Signs: BP: 206/123 (10/26 1844) Pulse Rate: 104 (10/26 1844)  Labs: Recent Labs    11/04/19 0736  HGB 11.9*  HCT 39.5  PLT 314  CREATININE 0.66  TROPONINIHS 8   Estimated Creatinine Clearance: 71.5 mL/min (by C-G formula based on SCr of 0.66 mg/dL).  Medical History: Past Medical History:  Diagnosis Date  . Anxiety   . Asthma   . COPD (chronic obstructive pulmonary disease) ([place])   . Depression    Medications:  Scheduled:  . aspirin EC  81 mg Oral Daily  . atorvastatin  40 mg Oral QHS  . enoxaparin (LOVENOX) injection  70 mg Subcutaneous BID  . fluticasone furoate-vilanterol  1 puff Inhalation Daily  . lamoTRIgine  200 mg Oral QHS  . lisinopril  2.5 mg Oral Daily  . loratadine  10 mg Oral QPM  . Methylphenidate HCl ER  72 mg Oral Daily  . methylPREDNISolone  4 mg Oral PC lunch  . methylPREDNISolone  4 mg Oral PC supper  . [START ON 11/05/2019] methylPREDNISolone  4 mg Oral 3 x daily with food  . [START ON 11/06/2019] methylPREDNISolone  4 mg Oral 4X daily taper  . methylPREDNISolone  8 mg Oral AC breakfast  . methylPREDNISolone  8 mg Oral Nightly  . [START ON 11/05/2019] methylPREDNISolone  8 mg Oral Nightly  . montelukast  10 mg Oral QHS  . pantoprazole (PROTONIX) IV  40 mg Intravenous Q24H  . QUEtiapine  150 mg Oral QHS  . risperiDONE  3 mg Oral QHS  . sodium chloride flush  3 mL Intravenous Q12H  . Tiotropium Bromide Monohydrate  2 puff Inhalation Daily  . venlafaxine XR  150 mg Oral QHS   Assessment: 41 yoF to ED with ShOB and LE edema. Non-occlusive PE found on CTA. Lovenox ordered per pharmacy.  Heparin SQ 5000 previously ordered, last  dose at 16:30 - discontinued. Requiring O2, admit  Goal of Therapy:  Monitor platelets by anticoagulation protocol: Yes   Plan:  Lovenox 70mg  SQ bid Anticipate oral anti-coagulation, await discussion  Belinda Schlichting, [place] [phone] 11/04/2019,7:16 PM

## 2019-11-04 NOTE — CV Procedure (Signed)
2D echo attempted, but patient going to CT. Will try echo later

## 2019-11-04 NOTE — ED Notes (Signed)
Transport delayed due to Elevated BP.

## 2019-11-04 NOTE — Progress Notes (Signed)
  Echocardiogram 2D Echocardiogram has been performed.  Julia Daniels 11/04/2019, 3:50 PM

## 2019-11-04 NOTE — H&P (Addendum)
History and Physical    Julia Daniels DOB: Mar 04, 1950 DOA: 11/04/2019  PCP: Simona Huh, NP    Patient coming from:  Rehab    Chief Complaint:  Sob   HPI: Julia Daniels is a 69 y.o. female with medical history significant of CHF( diastolic),COPD,DNR,Asthma seen in ed for sob that started yesterday.Pt was started on oxygen in august this year by her primary care and pt sees pulmonary for her copd as well. Since yesterday she has had difficulty breathing and her inhalers have not helped. She also reports LE edema that is new to her and denies any heart history but chart shows chf and echo from august shows WMA in inf wall. She has been admitted to rehab on 9/4 due to debility and C6 compression fracture.  She is followed by Dr. 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  Started recently on fasenra.  Already on triple therapy.   ED Course:  Blood pressure (!) 197/105, pulse (!) 108, temperature 98.1 F (36.7 C), resp. rate (!) 21, height 5\' 3"  (1.6 m), weight 87.5 kg, SpO2 96 %.  SpO2: 96 % O2 Flow Rate (L/min): 15 L/min  Pt is alert and gives her history. States he legs edema has been going on for past few weeks.  Labs shows elevated glucose and mild anemia with hb of 11.9 and elevated wbc count of 12.4.but pt is afebrile and I do not suspect infection.CTA ordered shows nonocclusive pe in left upper lobe segmental branch of pulmonary artery.   Review of Systems: As per HPI otherwise all systems reviewed and negative.  Past Medical History:  Diagnosis Date  . Anxiety   . Asthma   . COPD (chronic obstructive pulmonary disease) ([place])   . Depression     Past Surgical History:  Procedure Laterality Date  . ABDOMINAL HYSTERECTOMY     partial      reports that she quit smoking about 2 years ago. Her smoking use included cigarettes. She has a 40.00 pack-year smoking history. She has never used smokeless tobacco. She reports that she does not drink  alcohol and does not use drugs.  Allergies  Allergen Reactions  . Sulfa Antibiotics Other (See Comments)    Mouth gets raw    Family History  Problem Relation Age of Onset  . Asthma Mother   . Lung cancer Father   . COPD Father     Prior to Admission medications   Medication Sig Start Date End Date Taking? Authorizing Provider  albuterol (PROAIR HFA) 108 (90 Base) MCG/ACT inhaler Inhale 2 puffs into the lungs every 6 (six) hours as needed for wheezing or shortness of breath. 07/25/19  Yes Tanda Rockers, MD  albuterol (PROVENTIL) (2.5 MG/3ML) 0.083% nebulizer solution Take 3 mLs (2.5 mg total) by nebulization every 6 (six) hours as needed for wheezing or shortness of breath. 09/10/19  Yes Tanda Rockers, MD  ALPRAZolam Duanne Moron) 0.25 MG tablet Take 1 tablet (0.25 mg total) by mouth daily as needed for anxiety. Patient taking differently: Take 0.25 mg by mouth at bedtime.  10/07/19  Yes Aline August, MD  atorvastatin (LIPITOR) 40 MG tablet Take 1 tablet (40 mg total) by mouth daily. Patient taking differently: Take 40 mg by mouth at bedtime.  09/12/19 11/04/19 Yes Florencia Reasons, MD  baclofen (LIORESAL) 10 MG tablet Take 10 mg by mouth 3 (three) times daily.  09/05/19  Yes [provider]  budesonide-formoterol (SYMBICORT) 160-4.5 MCG/ACT inhaler INHALE 2 PUFFS  INTO LUNGS TWICE A DAY Patient taking differently: Inhale 2 puffs into the lungs in the morning and at bedtime.  08/26/19  Yes Tanda Rockers, MD  famotidine (PEPCID) 20 MG tablet One at bedtime Patient taking differently: Take 20 mg by mouth at bedtime.  07/25/19  Yes Tanda Rockers, MD  guaiFENesin (MUCINEX) 600 MG 12 hr tablet Take 1 tablet (600 mg total) by mouth 2 (two) times daily. Patient taking differently: Take 1,200 mg by mouth 2 (two) times daily.  09/12/19  Yes Florencia Reasons, MD  HYDROcodone-acetaminophen (NORCO/VICODIN) 5-325 MG tablet Take 1 tablet by mouth 3 (three) times daily as needed for moderate pain. Patient taking  differently: Take 1 tablet by mouth in the morning, at noon, and at bedtime.  10/07/19  Yes Aline August, MD  ibuprofen (ADVIL) 800 MG tablet Take 800 mg by mouth 2 (two) times daily as needed for pain. 08/25/19  Yes [provider]  [facility]) OINT Apply 1 application topically daily as needed (rash).   Yes [provider]  lamoTRIgine (LAMICTAL) 200 MG tablet Take 200 mg by mouth at bedtime.    Yes [provider]  lidocaine (LIDODERM) 5 % Place 1 patch onto the skin daily. Remove & Discard patch within 12 hours or as directed by MD 09/12/19  Yes Florencia Reasons, MD  loratadine (CLARITIN) 10 MG tablet Take 10 mg by mouth every evening.    Yes [provider]  Methylphenidate HCl ER 72 MG TBCR Take 72 mg by mouth daily.   Yes [provider]  montelukast (SINGULAIR) 10 MG tablet Take 1 tablet (10 mg total) by mouth at bedtime. 04/17/19  Yes Tanda Rockers, MD  pantoprazole (PROTONIX) 40 MG tablet Take 1 tablet (40 mg total) by mouth daily. Take 30-60 min before first meal of the day 07/25/19  Yes Tanda Rockers, MD  predniSONE (DELTASONE) 20 MG tablet Take 20 mg by mouth daily with breakfast.    Yes [provider]  QUEtiapine (SEROQUEL) 100 MG tablet Take 150 mg by mouth at bedtime. 08/18/19  Yes [provider]  risperiDONE (RISPERDAL) 3 MG tablet Take 3 mg by mouth at bedtime.   Yes [provider]  senna-docusate (SENOKOT-S) 8.6-50 MG tablet Take 1 tablet by mouth 2 (two) times daily. 09/12/19  Yes Florencia Reasons, MD  Tiotropium Bromide Monohydrate (SPIRIVA RESPIMAT) 2.5 MCG/ACT AERS Inhale 2 puffs into the lungs daily. 08/29/19  Yes Tanda Rockers, MD  venlafaxine XR (EFFEXOR-XR) 150 MG 24 hr capsule Take 150 mg by mouth at bedtime.    Yes [provider]  Vitamin D, Ergocalciferol, (DRISDOL) 50000 units CAPS capsule Take 50,000 Units by mouth every Saturday.  06/18/17  Yes [provider]  furosemide  (LASIX) 40 MG tablet Take 0.5 tablets (20 mg total) by mouth daily. Patient not taking: Reported on 11/04/2019 10/25/19   Dwyane Dee, MD    Physical Exam: Vitals:   11/04/19 0830 11/04/19 0845 11/04/19 1030 11/04/19 1100  BP: 135/60 113/87 (!) 188/89 (!) 178/105  Pulse: 95 (!) 103 (!) 104 100  Resp: (!) 23 (!) 22 (!) 24 (!) 24  Temp:      SpO2: 96% 97% 96% 93%  Weight:      Height:        Constitutional: NAD, calm, comfortable Vitals:   11/04/19 0830 11/04/19 0845 11/04/19 1030 11/04/19 1100  BP: 135/60 113/87 (!) 188/89 (!) 178/105  Pulse: 95 (!) 103 (!)  104 100  Resp: (!) 23 (!) 22 (!) 24 (!) 24  Temp:      SpO2: 96% 97% 96% 93%  Weight:      Height:       Eyes: PERRL, EOMI lids and conjunctivae normal ENMT: Mucous membranes are moist. Posterior pharynx clear of any exudate or lesions.Normal dentition.  Neck: normal, supple, no masses, no thyromegaly, no carotid bruit  Respiratory: insp wheezing on exam marked posteriorly, wheezing, no crackles. Normal respiratory effort. No accessory muscle use.  Cardiovascular: Regular rate and rhythm, no murmurs / rubs / gallops. 2+ extremity edema. 2+ pedal pulses. Abdomen: no tenderness, no masses palpated. No hepatosplenomegaly. Bowel sounds positive.  Musculoskeletal: no clubbing / cyanosis. No joint deformity upper and lower extremities. Pt moving all four ext , no contractures. Normal muscle tone.  Skin: no rashes, lesions, ulcers. No induration Neurologic: CN 2-12 grossly intact. Strength 5/5 in both UE. Psychiatric: Normal judgment and insight. Alert and oriented x 3. Normal mood.   Labs on Admission: I have personally reviewed following labs and imaging studies  CBC: Recent Labs  Lab 11/04/19 0736  WBC 12.4*  NEUTROABS 9.8*  HGB 11.9*  HCT 39.5  MCV 105.9*  PLT 128   Basic Metabolic Panel: Recent Labs  Lab 11/04/19 0736  NA 144  K 3.9  CL 101  CO2 33*  GLUCOSE 114*  BUN 12  CREATININE 0.66  CALCIUM 9.8    GFR: Estimated Creatinine Clearance: 71.5 mL/min (by C-G formula based on SCr of 0.66 mg/dL). Liver Function Tests: Recent Labs  Lab 11/04/19 0736  AST 11*  ALT 14  ALKPHOS 61  BILITOT 0.5  PROT 6.2*  ALBUMIN 3.8   No results for input(s): LIPASE, AMYLASE in the last 168 hours. No results for input(s): AMMONIA in the last 168 hours. Coagulation Profile: No results for input(s): INR, PROTIME in the last 168 hours. Cardiac Enzymes: No results for input(s): CKTOTAL, CKMB, CKMBINDEX, TROPONINI in the last 168 hours. BNP (last 3 results) No results for input(s): PROBNP in the last 8760 hours. HbA1C: No results for input(s): HGBA1C in the last 72 hours. CBG: No results for input(s): GLUCAP in the last 168 hours. Lipid Profile: No results for input(s): CHOL, HDL, LDLCALC, TRIG, CHOLHDL, LDLDIRECT in the last 72 hours. Thyroid Function Tests: No results for input(s): TSH, T4TOTAL, FREET4, T3FREE, THYROIDAB in the last 72 hours. Anemia Panel: No results for input(s): VITAMINB12, FOLATE, FERRITIN, TIBC, IRON, RETICCTPCT in the last 72 hours. Urine analysis:    Component Value Date/Time   COLORURINE YELLOW 09/10/2019 1302   APPEARANCEUR CLEAR 09/10/2019 1302   LABSPEC 1.018 09/10/2019 1302   PHURINE 5.0 09/10/2019 1302   GLUCOSEU NEGATIVE 09/10/2019 1302   HGBUR NEGATIVE 09/10/2019 1302   BILIRUBINUR NEGATIVE 09/10/2019 1302   BILIRUBINUR n 09/23/2012 1111   KETONESUR NEGATIVE 09/10/2019 1302   PROTEINUR NEGATIVE 09/10/2019 1302   UROBILINOGEN 0.2 09/23/2012 1111   NITRITE NEGATIVE 09/10/2019 1302   LEUKOCYTESUR NEGATIVE 09/10/2019 1302   No intake or output data in the 24 hours ending 11/04/19 1144 Lab Results  Component Value Date   CREATININE 0.66 11/04/2019   CREATININE 0.58 10/24/2019   CREATININE 0.74 10/23/2019    COVID-19 Labs  No results for input(s): DDIMER, FERRITIN, LDH, CRP in the last 72 hours.  Lab Results  Component Value Date   [place]  NEGATIVE 11/04/2019   [place] NEGATIVE 10/24/2019   [place] NEGATIVE 10/21/2019   [place] NEGATIVE 10/06/2019  Radiological Exams on Admission: DG Chest Portable 1 View  Result Date: 11/04/2019 CLINICAL DATA:  Shortness of breath EXAM: PORTABLE CHEST 1 VIEW COMPARISON:  October 21, 2019 FINDINGS: There is bibasilar atelectasis. Lungs elsewhere are clear. Heart size and pulmonary vascularity are normal. No adenopathy. No bone lesions. IMPRESSION: Bibasilar atelectasis. Lungs elsewhere clear. Heart size within normal limits. Electronically Signed   By: Lowella Grip III M.D.   On: 11/04/2019 07:53    EKG: Independently reviewed. Sinus tach 108 and twi in v4,v5 are improved from prior ekg in September 2021.  Assessment/Plan Acute hypoxic respiratory failure: D/d include pe / pna/ chf/ COPD exacerbation We will continue with supplemental oxygen and evaluate with CTA,   - admit to observation, med-surg  - continue duonebs q6h and solumedrol 40mg  qday then to quick taper .  - multiple admission for the same over the past few months  - check d-dimer     - add IS  BLE edema     - new onset per her report; starting Friday. Last echo in August showed G1DD     - BNP is normal, trp is normal     - trial low dose lasix (40mg ); watch renal function  HLD     - atorvastatin  Chronic back pain     - PRN baclofen  GERD     - protonix  Anxiety /Bipolar d/o     - lamotrigine,quetiapine, methylphenidate, risperidone, effexor  DVT prophylaxis:  Lovenox.  Code Status:  DNR.  Family Communication:  Anaiyah Anglemyer: [phone].   Disposition Plan:  Rehab   Consults called:  None  Admission status: Inpatient. Status is: Inpatient  Remains inpatient appropriate because:Inpatient level of care appropriate due to severity of illness   Dispo: The patient is from: Rehab              Anticipated d/c is to: Rehab              Anticipated d/c date is: 2 days               Patient currently is not medically stable to d/c.   Para Skeans MD [facility] Pager [phone] If 7PM-7AM, please contact night-coverage [email or web address] Password [facility] 11/04/2019, 11:44 AM    Addendum: cta resulted shows left upper lobe pulmonary embolism , Lovenox started plz d/w pt the results and I have started Lovenox per pharmacy consult.

## 2019-11-04 NOTE — ED Triage Notes (Signed)
Pt presents via EMS c/o SOB getting worse this morning, wears 4L [place] at baseline, placed on 15L [place] with EMS, given albuterol tmt, duoneb, solumedrol and magensium in route with minimal improvement, hx COPD

## 2019-11-04 NOTE — Progress Notes (Signed)
Called patient's son - Julia Daniels and provided an update on his mother's condition.  All questions were addressed.  Son is concerned about his mother's recent frequent hospitalizations.  Explained COPD is a chronic disease and unfortunately will get progressively worse with time.

## 2019-11-05 ENCOUNTER — Other Ambulatory Visit: Payer: Self-pay

## 2019-11-05 LAB — COMPREHENSIVE METABOLIC PANEL
ALT: 13 U/L (ref 0–44)
AST: 10 U/L — ABNORMAL LOW (ref 15–41)
Albumin: 3.7 g/dL (ref 3.5–5.0)
Alkaline Phosphatase: 59 U/L (ref 38–126)
Anion gap: 6 (ref 5–15)
BUN: 13 mg/dL (ref 8–23)
CO2: 35 mmol/L — ABNORMAL HIGH (ref 22–32)
Calcium: 9.9 mg/dL (ref 8.9–10.3)
Chloride: 99 mmol/L (ref 98–111)
Creatinine, Ser: 0.57 mg/dL (ref 0.44–1.00)
GFR, Estimated: >60 mL/min (ref >60)
Glucose, Bld: 132 mg/dL — ABNORMAL HIGH (ref 70–99)
Potassium: 4.7 mmol/L (ref 3.5–5.1)
Sodium: 140 mmol/L (ref 135–145)
Total Bilirubin: 0.4 mg/dL (ref 0.3–1.2)
Total Protein: 5.9 g/dL — ABNORMAL LOW (ref 6.5–8.1)

## 2019-11-05 LAB — CBC WITH DIFFERENTIAL/PLATELET
Abs Immature Granulocytes: 0.21 10*3/uL — ABNORMAL HIGH (ref 0.00–0.07)
Basophils Absolute: 0.1 10*3/uL (ref 0.0–0.1)
Basophils Relative: 0 %
Eosinophils Absolute: 0 10*3/uL (ref 0.0–0.5)
Eosinophils Relative: 0 %
HCT: 38.3 % (ref 36.0–46.0)
Hemoglobin: 11.7 g/dL — ABNORMAL LOW (ref 12.0–15.0)
Immature Granulocytes: 2 %
Lymphocytes Relative: 6 %
Lymphs Abs: 0.8 10*3/uL (ref 0.7–4.0)
MCH: 32.1 pg (ref 26.0–34.0)
MCHC: 30.5 g/dL (ref 30.0–36.0)
MCV: 104.9 fL — ABNORMAL HIGH (ref 80.0–100.0)
Monocytes Absolute: 0.6 10*3/uL (ref 0.1–1.0)
Monocytes Relative: 5 %
Neutro Abs: 12.3 10*3/uL — ABNORMAL HIGH (ref 1.7–7.7)
Neutrophils Relative %: 87 %
Platelets: 306 10*3/uL (ref 150–400)
RBC: 3.65 MIL/uL — ABNORMAL LOW (ref 3.87–5.11)
RDW: 13.2 % (ref 11.5–15.5)
WBC: 14.1 10*3/uL — ABNORMAL HIGH (ref 4.0–10.5)
nRBC: 0 % (ref 0.0–0.2)

## 2019-11-05 LAB — PHOSPHORUS: Phosphorus: 4 mg/dL (ref 2.5–4.6)

## 2019-11-05 LAB — MAGNESIUM: Magnesium: 2.4 mg/dL (ref 1.7–2.4)

## 2019-11-05 MED ORDER — BUDESONIDE 0.5 MG/2ML IN SUSP
0.5000 mg | Freq: Two times a day (BID) | RESPIRATORY_TRACT | Status: DC
  Administered 2019-11-05 – 2019-11-10 (×11): 0.5 mg via RESPIRATORY_TRACT
  Filled 2019-11-05 (×11): qty 2

## 2019-11-05 MED ORDER — CYANOCOBALAMIN 1000 MCG/ML IJ SOLN
1000.0000 ug | Freq: Once | INTRAMUSCULAR | Status: AC
  Administered 2019-11-05: 1000 ug via INTRAMUSCULAR
  Filled 2019-11-05: qty 1

## 2019-11-05 MED ORDER — ENOXAPARIN SODIUM 100 MG/ML ~~LOC~~ SOLN
90.0000 mg | Freq: Two times a day (BID) | SUBCUTANEOUS | Status: DC
  Administered 2019-11-05: 90 mg via SUBCUTANEOUS
  Filled 2019-11-05 (×2): qty 0.9

## 2019-11-05 MED ORDER — VITAMIN B-12 1000 MCG PO TABS
500.0000 ug | ORAL_TABLET | Freq: Every day | ORAL | Status: DC
  Administered 2019-11-06 – 2019-11-10 (×5): 500 ug via ORAL
  Filled 2019-11-05 (×5): qty 1

## 2019-11-05 MED ORDER — IPRATROPIUM-ALBUTEROL 0.5-2.5 (3) MG/3ML IN SOLN
3.0000 mL | Freq: Four times a day (QID) | RESPIRATORY_TRACT | Status: DC
  Administered 2019-11-05 – 2019-11-10 (×23): 3 mL via RESPIRATORY_TRACT
  Filled 2019-11-05 (×23): qty 3

## 2019-11-05 MED ORDER — METHYLPREDNISOLONE SODIUM SUCC 40 MG IJ SOLR
40.0000 mg | Freq: Two times a day (BID) | INTRAMUSCULAR | Status: DC
  Administered 2019-11-05 – 2019-11-06 (×3): 40 mg via INTRAVENOUS
  Filled 2019-11-05 (×3): qty 1

## 2019-11-05 NOTE — Progress Notes (Signed)
PROGRESS NOTE    MIONNA ADVINCULA  TRR:[id number] DOB: 16-Feb-1950 DOA: 11/04/2019 PCP: Simona Huh, NP   Chief Complain: Shortness of breath  Brief Narrative:  Patient is a 69 year old female with history of diastolic congestive heart failure, COPD, asthma who presented to the emergency department from [place] with complaints of shortness of breath.She is on 4 L of oxygen at baseline.  Shortness of breath started about a day ago and the inhalers did not help.  She was also reporting some lower extremity edema.  She was admitted to rehab on 9/4 due to debility/C6 compression fracture.  She follows with Dr. Melvyn Novas for her lung disease.  On presentation, she was hypotensive, tachycardic.  She was hypoxic requiring high flow oxygen.  CT angiogram showed nonocclusive PE on the left upper lobe.  Assessment & Plan:   Active Problems:   Respiratory failure with hypoxia (HCC)   Acute PE: CT angio showing nonocclusive pulmonary embolus within a segmental branch pulmonary artery of the left upper lobe with involvement of 2 subsegmental branches. No central pulmonary emboli. No evidence of right heart strain.  Unsure if this PE is causing significant hypoxia that the patient is having.  Currently she is on high flow oxygen.  She has been started on Lovenox.  Will change to Eliquis when appropriate.  Asthma/COPD exacerbation: Continue supplemental oxygen, bronchodilators,.  Started on Solu-Medrol, DuoNeb.  Follows with pulmonology,Dr Melvyn Novas, as an outpatient.  Bilateral lower extremity edema: Has history of diastolic congestive heart failure.  Echocardiogram in August this year showed normal left ventricular ejection fraction, grade 1 diastolic dysfunction.  Started on 40 mg of Lasix daily.  Leg edema has significantly improved today.  Hyperlipidemia: Continue Lipitor  Back pain/vertebral compression fracture: CT also showed interval progression of severe compression fracture of  the T5 vertebral body.  She might need to follow-up with IR for kyphoplasty after resolution of her acute pulmonary symptoms.  Currently she is residing at skilled nursing facility.  Hypertension: Currently patient is hypertensive.  Continue current medications.  Monitor blood pressure.  GERD: Continue Protonix  Vitamin B12 deficiency: Started on supplementation.  Leukocytosis: Mild.  Low suspicion for infectious etiology.  Most likely reactive.  Continue to monitor  History of anxiety/bipolar disorder: On lamotrigine, quetiapine, risperidone, Effexor         DVT prophylaxis:Lovenox Code Status: Full Family Communication: None at bed side Status is: Inpatient  Remains inpatient appropriate because:IV treatments appropriate due to intensity of illness or inability to take PO   Dispo: The patient is from: Home              Anticipated d/c is to: Home              Anticipated d/c date is: 2 days              Patient currently is not medically stable to d/c.     Consultants: None  Procedures:None  Antimicrobials:  Anti-infectives (From admission, onward)   None      Subjective:  Patient seen and examined at the bedside this morning.  Hemodynamically stable but she was having severe wheezes, shortness of breath.  On 6 L of high flow oxygen.  Objective: Vitals:   11/05/19 0500 11/05/19 0600 11/05/19 0630 11/05/19 0700  BP: (!) 148/85 115/61  (!) 154/96  Pulse: 89 85  77  Resp: 20 15  (!) 22  Temp:      TempSrc:      SpO2:  98% 95%  96%  Weight:   91.9 kg   Height:        Intake/Output Summary (Last 24 hours) at 11/05/2019 0745 Last data filed at 11/05/2019 0700 Gross per 24 hour  Intake --  Output 650 ml  Net -650 ml   Filed Weights   11/04/19 0700 11/05/19 0630  Weight: 87.5 kg 91.9 kg    Examination:  General exam: Deconditioned, debilitated, morbidly obese, chronically looking HEENT:PERRL,Oral mucosa moist, Ear/Nose normal on gross  exam Respiratory system: Bilateral expiratory wheezes , diminished air sounds cardiovascular system: Sinus tachycardia. No JVD, murmurs, rubs, gallops or clicks. No pedal edema. Gastrointestinal system: Abdomen is nondistended, soft and nontender. No organomegaly or masses felt. Normal bowel sounds heard. Central nervous system: Alert and oriented. No focal neurological deficits. Extremities: Trace bilateral lower extremity edema, no clubbing ,no cyanosis Skin: No rashes, lesions or ulcers,no icterus ,no pallor   Data Reviewed: I have personally reviewed following labs and imaging studies  CBC: Recent Labs  Lab 11/04/19 0736 11/05/19 0251  WBC 12.4* 14.1*  NEUTROABS 9.8* 12.3*  HGB 11.9* 11.7*  HCT 39.5 38.3  MCV 105.9* 104.9*  PLT 314 233   Basic Metabolic Panel: Recent Labs  Lab 11/04/19 0736 11/05/19 0251  NA 144 140  K 3.9 4.7  CL 101 99  CO2 33* 35*  GLUCOSE 114* 132*  BUN 12 13  CREATININE 0.66 0.57  CALCIUM 9.8 9.9  MG  --  2.4  PHOS  --  4.0   GFR: Estimated Creatinine Clearance: 73.5 mL/min (by C-G formula based on SCr of 0.57 mg/dL). Liver Function Tests: Recent Labs  Lab 11/04/19 0736 11/05/19 0251  AST 11* 10*  ALT 14 13  ALKPHOS 61 59  BILITOT 0.5 0.4  PROT 6.2* 5.9*  ALBUMIN 3.8 3.7   No results for input(s): LIPASE, AMYLASE in the last 168 hours. No results for input(s): AMMONIA in the last 168 hours. Coagulation Profile: No results for input(s): INR, PROTIME in the last 168 hours. Cardiac Enzymes: No results for input(s): CKTOTAL, CKMB, CKMBINDEX, TROPONINI in the last 168 hours. BNP (last 3 results) No results for input(s): PROBNP in the last 8760 hours. HbA1C: No results for input(s): HGBA1C in the last 72 hours. CBG: No results for input(s): GLUCAP in the last 168 hours. Lipid Profile: No results for input(s): CHOL, HDL, LDLCALC, TRIG, CHOLHDL, LDLDIRECT in the last 72 hours. Thyroid Function Tests: Recent Labs    11/04/19 0736   TSH 0.968  FREET4 0.97   Anemia Panel: Recent Labs    11/04/19 2102  VITAMINB12 133*  FOLATE 11.9  FERRITIN 44  TIBC 317  IRON 90  RETICCTPCT 2.2   Sepsis Labs: No results for input(s): PROCALCITON, LATICACIDVEN in the last 168 hours.  Recent Results (from the past 240 hour(s))  Respiratory Panel by RT PCR (Flu A&B, Covid) - Nasopharyngeal Swab     Status: None   Collection Time: 11/04/19  8:52 AM   Specimen: Nasopharyngeal Swab  Result Value Ref Range Status   SARS Coronavirus 2 by RT PCR NEGATIVE NEGATIVE Final    Comment: (NOTE) SARS-CoV-2 target nucleic acids are NOT DETECTED.  The SARS-CoV-2 RNA is generally detectable in upper respiratoy specimens during the acute phase of infection. The lowest concentration of SARS-CoV-2 viral copies this assay can detect is 131 copies/mL. A negative result does not preclude SARS-Cov-2 infection and should not be used as the sole basis for treatment or other patient management decisions.  A negative result may occur with  improper specimen collection/handling, submission of specimen other than nasopharyngeal swab, presence of viral mutation(s) within the areas targeted by this assay, and inadequate number of viral copies (<131 copies/mL). A negative result must be combined with clinical observations, patient history, and epidemiological information. The expected result is Negative.  Fact Sheet for Patients:  PinkCheek.be  Fact Sheet for Healthcare Providers:  GravelBags.it  This test is no t yet approved or cleared by the [place] FDA and  has been authorized for detection and/or diagnosis of SARS-CoV-2 by FDA under an Emergency Use Authorization (EUA). This EUA will remain  in effect (meaning this test can be used) for the duration of the COVID-19 declaration under Section 564(b)(1) of the Act, 21 U.S.C. section 360bbb-3(b)(1), unless the authorization is  terminated or revoked sooner.     Influenza A by PCR NEGATIVE NEGATIVE Final   Influenza B by PCR NEGATIVE NEGATIVE Final    Comment: (NOTE) The Xpert Xpress SARS-CoV-2/FLU/RSV assay is intended as an aid in  the diagnosis of influenza from Nasopharyngeal swab specimens and  should not be used as a sole basis for treatment. Nasal washings and  aspirates are unacceptable for Xpert Xpress SARS-CoV-2/FLU/RSV  testing.  Fact Sheet for Patients: PinkCheek.be  Fact Sheet for Healthcare Providers: GravelBags.it  This test is not yet approved or cleared by the [place] FDA and  has been authorized for detection and/or diagnosis of SARS-CoV-2 by  FDA under an Emergency Use Authorization (EUA). This EUA will remain  in effect (meaning this test can be used) for the duration of the  Covid-19 declaration under Section 564(b)(1) of the Act, 21  U.S.C. section 360bbb-3(b)(1), unless the authorization is  terminated or revoked. Performed at [facility], [place] [address], [place], [place] [address]   MRSA PCR Screening     Status: None   Collection Time: 11/04/19  9:10 PM   Specimen: Nasal Mucosa; Nasopharyngeal  Result Value Ref Range Status   MRSA by PCR NEGATIVE NEGATIVE Final    Comment:        The GeneXpert MRSA Assay (FDA approved for NASAL specimens only), is one component of a comprehensive MRSA colonization surveillance program. It is not intended to diagnose MRSA infection nor to guide or monitor treatment for MRSA infections. Performed at [facility], [place] [address] [place], [place] [address]          Radiology Studies: CT ANGIO CHEST PE W OR WO CONTRAST  Result Date: 11/04/2019 CLINICAL DATA:  Shortness of breath, respiratory failure EXAM: CT ANGIOGRAPHY CHEST WITH CONTRAST TECHNIQUE: Multidetector CT imaging of the chest was performed using the standard protocol  during bolus administration of intravenous contrast. Multiplanar CT image reconstructions and MIPs were obtained to evaluate the vascular anatomy. CONTRAST:  144mL OMNIPAQUE IOHEXOL 350 MG/ML SOLN COMPARISON:  09/08/2019 FINDINGS: Cardiovascular: Satisfactory opacification of the pulmonary arteries. There is in the centrally located nonocclusive filling defect within a segmental branch pulmonary artery of the left upper lobe with involvement of 2 subsegmental branches (series 6, images 89-94; series 5, images 70-76). No additional filling defects are seen. Specifically no large or central filling defect. Normal RV to LV ratio without evidence to suggest right heart strain. Thoracic aorta is nonaneurysmal. Atherosclerotic calcifications of the aorta and coronary arteries. Normal heart size. No significant pericardial effusion. Mediastinum/Nodes: No enlarged mediastinal, hilar, or axillary lymph nodes. Thyroid gland, trachea, and esophagus demonstrate no significant findings. Lungs/Pleura: Mild  bibasilar atelectasis or scarring. Chronic mild pleural thickening at the posterior right lung base. No focal airspace consolidation, pleural effusion, or pneumothorax. Upper Abdomen: No acute abnormality. Musculoskeletal: Severe compression deformity of the T4 vertebral body, new from prior. Chronic severe compression fracture of the T5 vertebral body, which has slightly progressed from prior. There is approximately 4 mm of bony retropulsion of the T5 fracture site, progressed. Review of the MIP images confirms the above findings. IMPRESSION: 1. Nonocclusive pulmonary embolus within a segmental branch pulmonary artery of the left upper lobe with involvement of 2 subsegmental branches. No central pulmonary emboli. No evidence of right heart strain. 2. Severe compression deformity of the T4 vertebral body, new from prior. 3. Interval progression of severe compression fracture of the T5 vertebral body. There is approximately 4 mm  of bony retropulsion of the T5 fracture site, progressed from prior. 4. Lungs are clear. 5. Aortic atherosclerosis. (ICD10-I70.0). Electronically Signed   By: Davina Poke D.O.   On: 11/04/2019 15:10   DG Chest Portable 1 View  Result Date: 11/04/2019 CLINICAL DATA:  Shortness of breath EXAM: PORTABLE CHEST 1 VIEW COMPARISON:  October 21, 2019 FINDINGS: There is bibasilar atelectasis. Lungs elsewhere are clear. Heart size and pulmonary vascularity are normal. No adenopathy. No bone lesions. IMPRESSION: Bibasilar atelectasis. Lungs elsewhere clear. Heart size within normal limits. Electronically Signed   By: Lowella Grip III M.D.   On: 11/04/2019 07:53   ECHOCARDIOGRAM COMPLETE  Result Date: 11/04/2019    ECHOCARDIOGRAM REPORT   Patient Name:   ANNALISSA MURPHEY Date of Exam: 11/04/2019 Medical Rec #:  [id number]           Height:       63.0 in Accession #:    [id number]          Weight:       193.0 lb Date of Birth:  Jun 06, 1950          BSA:          1.905 m Patient Age:    17 years            BP:           184/108 mmHg Patient Gender: F                   HR:           105 bpm. Exam Location:  Inpatient Procedure: 2D Echo, Cardiac Doppler, Color Doppler and Intracardiac            Opacification Agent Indications:    I50.40* Unspecified combined systolic (congestive) and diastolic                 (congestive) heart failure  History:        Patient has prior history of Echocardiogram examinations, most                 recent 09/09/2019. CHF, COPD, Signs/Symptoms:Shortness of Breath                 and Dyspnea; Risk Factors:Dyslipidemia, Current Smoker and                 Hypertension.  Sonographer:    Roseanna Rainbow RDCS Referring Phys: [id number] EKTA V PATEL  Sonographer Comments: Technically difficult study due to poor echo windows, Technically challenging study due to limited acoustic windows and patient is morbidly obese. Image acquisition challenging due to patient body habitus, Image acquisition  challenging due to respiratory  motion and Image acquisition challenging due to COPD. Patient in high Fowler's position due to dyspnea. IMPRESSIONS  1. Left ventricular ejection fraction, by estimation, is 50 to 55%. The left ventricle has low normal function. The left ventricle has no regional wall motion abnormalities. Left ventricular diastolic parameters are consistent with Grade I diastolic dysfunction (impaired relaxation). Elevated left atrial pressure.  2. Right ventricular systolic function is normal. The right ventricular size is normal. Tricuspid regurgitation signal is inadequate for assessing PA pressure.  3. The mitral valve is normal in structure. No evidence of mitral valve regurgitation.  4. The aortic valve is normal in structure. Aortic valve regurgitation is not visualized.  5. The inferior vena cava is normal in size with greater than 50% respiratory variability, suggesting right atrial pressure of 3 mmHg. FINDINGS  Left Ventricle: Left ventricular ejection fraction, by estimation, is 50 to 55%. The left ventricle has low normal function. The left ventricle has no regional wall motion abnormalities. Definity contrast agent was given IV to delineate the left ventricular endocardial borders. The left ventricular internal cavity size was normal in size. There is no left ventricular hypertrophy. Left ventricular diastolic parameters are consistent with Grade I diastolic dysfunction (impaired relaxation). Elevated left atrial pressure. Right Ventricle: The right ventricular size is normal. No increase in right ventricular wall thickness. Right ventricular systolic function is normal. Tricuspid regurgitation signal is inadequate for assessing PA pressure. Left Atrium: Left atrial size was normal in size. Right Atrium: Right atrial size was normal in size. Pericardium: There is no evidence of pericardial effusion. Mitral Valve: The mitral valve is normal in structure. No evidence of mitral valve  regurgitation. Tricuspid Valve: The tricuspid valve is grossly normal. Tricuspid valve regurgitation is not demonstrated. Aortic Valve: The aortic valve is normal in structure. Aortic valve regurgitation is not visualized. Pulmonic Valve: The pulmonic valve was not well visualized. Pulmonic valve regurgitation is not visualized. Aorta: The aortic root is normal in size and structure. Venous: The inferior vena cava is normal in size with greater than 50% respiratory variability, suggesting right atrial pressure of 3 mmHg. IAS/Shunts: No atrial level shunt detected by color flow Doppler.  LEFT VENTRICLE PLAX 2D LVIDd:         4.50 cm      Diastology LVIDs:         2.90 cm      LV e' medial:    6.34 cm/s LV PW:         1.40 cm      LV E/e' medial:  15.9 LV IVS:        1.05 cm      LV e' lateral:   7.65 cm/s LVOT diam:     1.90 cm      LV E/e' lateral: 13.2 LV SV:         73 LV SV Index:   38 LVOT Area:     2.84 cm  LV Volumes (MOD) LV vol d, MOD A2C: 110.0 ml LV vol d, MOD A4C: 98.5 ml LV vol s, MOD A2C: 55.6 ml LV vol s, MOD A4C: 52.2 ml LV SV MOD A2C:     54.4 ml LV SV MOD A4C:     98.5 ml LV SV MOD BP:      53.1 ml RIGHT VENTRICLE             IVC RV S prime:     16.80 cm/s  IVC diam: 1.80 cm TAPSE (M-mode): 1.5  cm LEFT ATRIUM           Index       RIGHT ATRIUM          Index LA diam:      2.90 cm 1.52 cm/m  RA Area:     9.97 cm LA Vol (A2C): 12.7 ml 6.67 ml/m  RA Volume:   20.00 ml 10.50 ml/m LA Vol (A4C): 23.4 ml 12.29 ml/m  AORTIC VALVE LVOT Vmax:   160.00 cm/s LVOT Vmean:  105.000 cm/s LVOT VTI:    0.256 m  AORTA Ao Root diam: 3.20 cm Ao Asc diam:  3.80 cm MITRAL VALVE MV Area (PHT): 5.66 cm     SHUNTS MV Decel Time: 134 msec     Systemic VTI:  0.26 m MV E velocity: 101.00 cm/s  Systemic Diam: 1.90 cm MV A velocity: 137.00 cm/s MV E/A ratio:  0.74 Mihai Croitoru MD Electronically signed by Sanda Klein MD Signature Date/Time: 11/04/2019/5:55:59 PM    Final         Scheduled Meds: . aspirin EC  81  mg Oral Daily  . atorvastatin  40 mg Oral QHS  . Chlorhexidine Gluconate Cloth  6 each Topical Daily  . enoxaparin (LOVENOX) injection  70 mg Subcutaneous BID  . fluticasone furoate-vilanterol  1 puff Inhalation Daily  . lamoTRIgine  200 mg Oral QHS  . lisinopril  2.5 mg Oral Daily  . loratadine  10 mg Oral QPM  . mouth rinse  15 mL Mouth Rinse BID  . methylphenidate  72 mg Oral Daily  . methylPREDNISolone  4 mg Oral 3 x daily with food  . [START ON 11/06/2019] methylPREDNISolone  4 mg Oral 4X daily taper  . methylPREDNISolone  8 mg Oral Nightly  . montelukast  10 mg Oral QHS  . pantoprazole (PROTONIX) IV  40 mg Intravenous Q24H  . QUEtiapine  150 mg Oral QHS  . risperiDONE  3 mg Oral QHS  . sodium chloride flush  3 mL Intravenous Q12H  . umeclidinium bromide  1 puff Inhalation Daily  . venlafaxine XR  150 mg Oral QHS   Continuous Infusions: . sodium chloride       LOS: 1 day    Time spent: 35 mins.More than 50% of that time was spent in counseling and/or coordination of care.      Shelly Coss, MD [facility] P10/27/2021, 7:45 AM

## 2019-11-05 NOTE — Progress Notes (Signed)
[place] for Lovenox Indication: pulmonary embolus  Allergies  Allergen Reactions  . Sulfa Antibiotics Other (See Comments)    Mouth gets raw   Patient Measurements: Height: 5\' 3"  (160 cm) Weight: 91.9 kg (202 lb 9.6 oz) IBW/kg (Calculated) : 52.4 Heparin Dosing Weight: 72kg  Vital Signs: Temp: 98.2 F (36.8 C) (10/27 0816) Temp Source: Axillary (10/27 0816) BP: 155/85 (10/27 1049) Pulse Rate: 87 (10/27 1049)  Labs: Recent Labs    11/04/19 0736 11/05/19 0251  HGB 11.9* 11.7*  HCT 39.5 38.3  PLT 314 306  CREATININE 0.66 0.57  TROPONINIHS 8  --    Estimated Creatinine Clearance: 73.5 mL/min (by C-G formula based on SCr of 0.57 mg/dL).  Medical History: Past Medical History:  Diagnosis Date  . Anxiety   . Asthma   . COPD (chronic obstructive pulmonary disease) ([place])   . Depression    Medications:  Scheduled:  . aspirin EC  81 mg Oral Daily  . atorvastatin  40 mg Oral QHS  . budesonide (PULMICORT) nebulizer solution  0.5 mg Nebulization BID  . Chlorhexidine Gluconate Cloth  6 each Topical Daily  . enoxaparin (LOVENOX) injection  90 mg Subcutaneous BID  . ipratropium-albuterol  3 mL Nebulization Q6H  . lamoTRIgine  200 mg Oral QHS  . lisinopril  2.5 mg Oral Daily  . loratadine  10 mg Oral QPM  . mouth rinse  15 mL Mouth Rinse BID  . methylphenidate  72 mg Oral Daily  . methylPREDNISolone (SOLU-MEDROL) injection  40 mg Intravenous Q12H  . montelukast  10 mg Oral QHS  . pantoprazole (PROTONIX) IV  40 mg Intravenous Q24H  . QUEtiapine  150 mg Oral QHS  . risperiDONE  3 mg Oral QHS  . sodium chloride flush  3 mL Intravenous Q12H  . venlafaxine XR  150 mg Oral QHS  . [START ON 11/06/2019] vitamin B-12  500 mcg Oral Daily   Assessment: 24 yoF to ED with ShOB and LE edema. Non-occlusive PE found on CTA, no right heart strain. Lovenox ordered per pharmacy.  Requiring O2 (4L at baseline).  Goal of Therapy:  Monitor  platelets by anticoagulation protocol: Yes   Plan:  Lovenox 1mg /kg (90mg ) SQ bid Monitor CBC, signs/symptoms of bleeding Anticipate oral anti-coagulation  Peggyann Juba, PharmD, [place] [phone] 11/05/2019,12:29 PM

## 2019-11-05 NOTE — Plan of Care (Signed)
  Problem: Education: Goal: Knowledge of General Education information will improve Description: Including pain rating scale, medication(s)/side effects and non-pharmacologic comfort measures Outcome: Progressing   Problem: Clinical Measurements: Goal: Respiratory complications will improve Outcome: Not Progressing   Problem: Activity: Goal: Risk for activity intolerance will decrease Outcome: Progressing   Problem: Nutrition: Goal: Adequate nutrition will be maintained Outcome: Progressing   Problem: Pain Managment: Goal: General experience of comfort will improve Outcome: Progressing

## 2019-11-05 NOTE — TOC Initial Note (Signed)
Transition of Care [facility]) - Initial/Assessment Note    Patient Details  Name: Julia Daniels MRN: [id number] Date of Birth: June 11, 1950  Transition of Care [facility]) CM/SW Contact:    Leeroy Cha, RN Phone Number: 11/05/2019, 9:08 AM  Clinical Narrative:                  69 y.o. female with medical history significant of CHF( diastolic),COPD,DNR,Asthma seen in ed for sob that started yesterday.Pt was started on oxygen in august this year by her primary care and pt sees pulmonary for her copd as well. Since yesterday she has had difficulty breathing and her inhalers have not helped. She also reports LE edema that is new to her and denies any heart history but chart shows chf and echo from august shows WMA in inf wall. She has been admitted to rehab on 9/4 due to debility and C6 compression fracture. She is followed by Dr. 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 Started recently on fasenra. Already on triple therapy.   ED Course:  Blood pressure (!) 197/105, pulse (!) 108, temperature 98.1 F (36.7 C), resp. rate (!) 21, height 5\' 3"  (1.6 m), weight 87.5 kg, SpO2 96 %.  SpO2: 96 % O2 Flow Rate (L/min): 15 L/min  Pt is alert and gives her history. States he legs edema has been going on for past few weeks.  Labs shows elevated glucose and mild anemia with hb of 11.9 and elevated wbc count of 12.4.but pt is afebrile and I do not suspect infection.CTA ordered shows nonocclusive pe in left upper lobe segmental branch of pulmonary artery.  ptinet is from [facility] is to return to snf,HFNC at 8l/min, iv solu medrol [id number]  covid is negative Following for progression and toc need. Expected Discharge Plan: [place] ([facility]) Barriers to Discharge: Barriers Unresolved (comment) (P.[place])   Patient Goals and CMS Choice Patient states their goals for this hospitalization and ongoing recovery are:: to return to snf CMS Medicare.gov  Compare Post Acute Care list provided to:: Patient    Expected Discharge Plan and Services Expected Discharge Plan: [place] ([facility])   Discharge Planning Services: CM Consult Post Acute Care Choice: [place] Living arrangements for the past 2 months: [place] Expected Discharge Date:  (unknown)                                    Prior Living Arrangements/Services Living arrangements for the past 2 months: [place] Lives with:: Facility Resident Patient language and need for interpreter reviewed:: Yes Do you feel safe going back to the place where you live?: Yes      Need for Family Participation in Patient Care: Yes (Comment) Care giver support system in place?: Yes (comment)   Criminal Activity/Legal Involvement Pertinent to Current Situation/Hospitalization: No - Comment as needed  Activities of Daily Living Home Assistive Devices/Equipment: Dentures (specify type), Walker (specify type), Bedside commode/3-in-1 (upper denture) ADL Screening (condition at time of admission) Patient's cognitive ability adequate to safely complete daily activities?: Yes Is the patient deaf or have difficulty hearing?: No Does the patient have difficulty seeing, even when wearing glasses/contacts?: No Does the patient have difficulty concentrating, remembering, or making decisions?: Yes Patient able to express need for assistance with ADLs?: Yes Does the patient have difficulty dressing or bathing?: Yes Independently performs ADLs?: No  Communication: Independent Dressing (OT): Needs assistance Is this a change from baseline?: Pre-admission baseline Grooming: Needs assistance Is this a change from baseline?: Pre-admission baseline Feeding: Needs assistance Is this a change from baseline?: Pre-admission baseline Bathing: Needs assistance Is this a change from baseline?: Pre-admission baseline Toileting: Needs  assistance Is this a change from baseline?: Pre-admission baseline In/Out Bed: Needs assistance Is this a change from baseline?: Pre-admission baseline Walks in Home: Needs assistance Is this a change from baseline?: Pre-admission baseline Does the patient have difficulty walking or climbing stairs?: Yes Weakness of Legs: Both Weakness of Arms/Hands: None  Permission Sought/Granted Permission sought to share information with : Case Manager                Emotional Assessment Appearance:: Appears stated age Attitude/Demeanor/Rapport: Engaged Affect (typically observed): Calm Orientation: : Oriented to Self, Oriented to Place, Oriented to  Time, Oriented to Situation Alcohol / Substance Use: Not Applicable Psych Involvement: No (comment)  Admission diagnosis:  COPD exacerbation ([place]) [J44.1] Respiratory failure with hypoxia ([place]) [J96.91] Patient Active Problem List   Diagnosis Date Noted  . Respiratory failure with hypoxia ([place]) 11/04/2019  . Pressure injury of skin 10/22/2019  . Chronic diastolic CHF (congestive heart failure) ([place]) 10/22/2019  . Chronic back pain 10/22/2019  . Palliative care by specialist   . Goals of care, counseling/discussion   . DNR (do not resuscitate)   . Acute respiratory failure with hypercapnia ([place]) 09/29/2019  . Drug induced constipation   . Leukocytosis   . Supplemental oxygen dependent   . Anxiety state   . Debility 09/13/2019  . Back pain 09/08/2019  . Essential hypertension 07/27/2019  . Allergic rhinitis 03/14/2018  . Acute respiratory failure with hypoxia ([place]) 03/08/2018  . Elevated MCV 01/31/2018  . Vitamin B12 deficiency 01/31/2018  . Acute on chronic respiratory failure with hypoxia ([place]) 01/31/2018  . Respiratory failure, acute-on-chronic ([place]) 12/31/2017  . COPD exacerbation ([place]) 09/18/2017  . Hypoxia   . Hyperlipidemia 08/23/2017  . Depression 08/23/2017  . Anxiety 08/23/2017  . Hypercalcemia 08/23/2017  .  Thrombocytosis 08/23/2017  . COPD with acute exacerbation ([place]) 08/23/2017  . ADD (attention deficit disorder) 08/30/2012  . Bipolar disorder, unspecified ([place]) 08/30/2012  . COPD  GOLD ? III with increased Eos and reversibility ? ACOS ?  08/30/2012  . Tobacco abuse 08/30/2012   PCP:  Simona Huh, NP Pharmacy:   [id number] - [place], [place] [address] [place] [address] Phone: [phone] Fax: [phone]  [facility] ([place]) [place], [place] [address] Phone: [phone] Fax: [phone]  [facility] [place], [address] [place] [address] Phone: [phone] Fax: [phone]  [facility] [place], [place] [address] [place] [address] Phone: [phone] Fax: [phone]     Social Determinants of Health (SDOH) Interventions    Readmission Risk Interventions No flowsheet data found.

## 2019-11-06 LAB — CBC WITH DIFFERENTIAL/PLATELET
Abs Immature Granulocytes: 0.1 10*3/uL — ABNORMAL HIGH (ref 0.00–0.07)
Basophils Absolute: 0 10*3/uL (ref 0.0–0.1)
Basophils Relative: 0 %
Eosinophils Absolute: 0 10*3/uL (ref 0.0–0.5)
Eosinophils Relative: 0 %
HCT: 36.4 % (ref 36.0–46.0)
Hemoglobin: 11 g/dL — ABNORMAL LOW (ref 12.0–15.0)
Immature Granulocytes: 1 %
Lymphocytes Relative: 7 %
Lymphs Abs: 0.8 10*3/uL (ref 0.7–4.0)
MCH: 32 pg (ref 26.0–34.0)
MCHC: 30.2 g/dL (ref 30.0–36.0)
MCV: 105.8 fL — ABNORMAL HIGH (ref 80.0–100.0)
Monocytes Absolute: 0.6 10*3/uL (ref 0.1–1.0)
Monocytes Relative: 5 %
Neutro Abs: 10.3 10*3/uL — ABNORMAL HIGH (ref 1.7–7.7)
Neutrophils Relative %: 87 %
Platelets: 294 10*3/uL (ref 150–400)
RBC: 3.44 MIL/uL — ABNORMAL LOW (ref 3.87–5.11)
RDW: 13.4 % (ref 11.5–15.5)
WBC: 11.8 10*3/uL — ABNORMAL HIGH (ref 4.0–10.5)
nRBC: 0 % (ref 0.0–0.2)

## 2019-11-06 LAB — BASIC METABOLIC PANEL
Anion gap: 7 (ref 5–15)
BUN: 16 mg/dL (ref 8–23)
CO2: 37 mmol/L — ABNORMAL HIGH (ref 22–32)
Calcium: 9.9 mg/dL (ref 8.9–10.3)
Chloride: 96 mmol/L — ABNORMAL LOW (ref 98–111)
Creatinine, Ser: 0.53 mg/dL (ref 0.44–1.00)
GFR, Estimated: >60 mL/min (ref >60)
Glucose, Bld: 132 mg/dL — ABNORMAL HIGH (ref 70–99)
Potassium: 4.7 mmol/L (ref 3.5–5.1)
Sodium: 140 mmol/L (ref 135–145)

## 2019-11-06 MED ORDER — PREDNISONE 20 MG PO TABS: 40.0000 mg | ORAL_TABLET | Freq: Every day | ORAL | Status: DC

## 2019-11-06 MED ORDER — APIXABAN 5 MG PO TABS
10.0000 mg | ORAL_TABLET | Freq: Two times a day (BID) | ORAL | Status: DC
  Administered 2019-11-06 – 2019-11-10 (×10): 10 mg via ORAL
  Filled 2019-11-06 (×3): qty 4
  Filled 2019-11-06 (×8): qty 2

## 2019-11-06 MED ORDER — PREDNISONE 20 MG PO TABS
40.0000 mg | ORAL_TABLET | Freq: Every day | ORAL | Status: DC
  Administered 2019-11-07: 40 mg via ORAL
  Filled 2019-11-06: qty 2

## 2019-11-06 MED ORDER — SALINE SPRAY 0.65 % NA SOLN
1.0000 | NASAL | Status: DC | PRN
  Filled 2019-11-06 (×2): qty 44

## 2019-11-06 MED ORDER — PANTOPRAZOLE SODIUM 40 MG PO TBEC
40.0000 mg | DELAYED_RELEASE_TABLET | Freq: Every day | ORAL | Status: DC
  Administered 2019-11-06 – 2019-11-10 (×5): 40 mg via ORAL
  Filled 2019-11-06 (×5): qty 1

## 2019-11-06 MED ORDER — APIXABAN 5 MG PO TABS: 5.0000 mg | ORAL_TABLET | Freq: Two times a day (BID) | ORAL | Status: DC

## 2019-11-06 MED ORDER — ALPRAZOLAM 0.5 MG PO TABS
0.5000 mg | ORAL_TABLET | Freq: Three times a day (TID) | ORAL | Status: DC | PRN
  Administered 2019-11-06 – 2019-11-10 (×9): 0.5 mg via ORAL
  Filled 2019-11-06 (×10): qty 1

## 2019-11-06 MED ORDER — HYDRALAZINE HCL 20 MG/ML IJ SOLN
10.0000 mg | Freq: Four times a day (QID) | INTRAMUSCULAR | Status: DC | PRN
  Administered 2019-11-06: 10 mg via INTRAVENOUS
  Filled 2019-11-06: qty 1

## 2019-11-06 MED ORDER — CHLORHEXIDINE GLUCONATE CLOTH 2 % EX PADS
6.0000 | MEDICATED_PAD | Freq: Every day | CUTANEOUS | Status: DC
  Administered 2019-11-06: 6 via TOPICAL

## 2019-11-06 NOTE — Progress Notes (Signed)
The patient is receiving Protonix by the intravenous route.  Based on criteria approved by the Pharmacy and [place], the medication is being converted to the equivalent oral dose form.  These criteria include: -No active GI bleeding -Able to tolerate diet of full liquids (or better) or tube feeding -Able to tolerate other medications by the oral or enteral route  If you have any questions about this conversion, please contact the Pharmacy Department (phone [phone]).  Thank you,  Minda Ditto PharmD 11/06/2019, 7:37 AM

## 2019-11-06 NOTE — Progress Notes (Signed)
PT ARRIVED TO ROOM 1424.  BASELINE VS DONE. 2ND SKIN VERIFICATION WITH SOPHIA RN. NO ACUTE SKIN CHANGES.  NO PRESSURE ULCERS NOTED. FRAGILE SKIN D/T STEROID. PT SETTLED IN BED AND INTRODUCED TO STAFF AND UNIT. 4P'S ADDRESSED.   PT GIVEN WELCOME PACKET.

## 2019-11-06 NOTE — Progress Notes (Signed)
REPORT RECEIVED FROM SIERRA, RN.  AWAITING PT ARRIVAL.

## 2019-11-06 NOTE — Evaluation (Addendum)
Physical Therapy Evaluation Patient Details Name: Julia Daniels MRN: [id number] DOB: [birth year] Today's Date: 11/06/2019   History of Present Illness  Patient is a 69 year old female with history of diastolic congestive heart failure, COPD, asthma who presented to the emergency department from [place] with complaints of shortness of breath, hypertensive, hypoxic.Marland KitchenShe is on 4 L of oxygen at baseline.   presentation, she was hypotensive, tachycardic.  She was hypoxic requiring high flow oxygen.  CT angiogram showed nonocclusive PE on the left upper lobe. H/O T6compression fracture, treated with TLSO.  Clinical Impression  Patient did sit and stand at bedside with HHA, noted 4/4 dyspnea on 4 L, SPO2 90%, drop from 94%.. Patient requires frequent rest breaks, becomes anxious when SOB. Patient reports progress has been [facility] at [facility]. Patient will beneift from return to SNF for rehab.Pt admitted with above diagnosis.   Pt currently with functional limitations due to the deficits listed below (see PT Problem List). Pt will benefit from skilled PT to increase their independence and safety with mobility to allow discharge to the venue listed below.       Follow Up Recommendations SNF    Equipment Recommendations  None recommended by PT    Recommendations for Other Services       Precautions / Restrictions Precautions Precautions: Fall Precaution Comments: on O2, Has TLSO when up      Mobility  Bed Mobility Overal bed mobility: Needs Assistance Bed Mobility: Supine to Sit;Sit to Supine     Supine to sit: Min assist Sit to supine: Min assist   General bed mobility comments: moves quickly, DOE 4/4 SPO2, SPO2 on 4 L 90%    Transfers Overall transfer level: Needs assistance   Transfers: Sit to/from Stand Sit to Stand: Mod assist         General transfer comment: stood from bed with HHA and took 4 sidesteps along bed. Dyspneic 4/4  Ambulation/Gait                 Stairs            Wheelchair Mobility    Modified Rankin (Stroke Patients Only)       Balance Overall balance assessment: Needs assistance Sitting-balance support: Bilateral upper extremity supported;Feet supported Sitting balance-Leahy Scale: Fair     Standing balance support: Bilateral upper extremity supported;During functional activity Standing balance-Leahy Scale: Poor                               Pertinent Vitals/Pain Pain Assessment: Faces Faces Pain Scale: Hurts little more Pain Location: back Pain Descriptors / Indicators: Grimacing Pain Intervention(s): Monitored during session    Home Living Family/patient expects to be discharged to:: Skilled nursing facility                      Prior Function                 Hand Dominance        Extremity/Trunk Assessment   Upper Extremity Assessment Upper Extremity Assessment: Generalized weakness    Lower Extremity Assessment Lower Extremity Assessment: Generalized weakness    Cervical / Trunk Assessment Cervical / Trunk Assessment: Normal  Communication      Cognition Arousal/Alertness: Awake/alert Behavior During Therapy: WFL for tasks assessed/performed Overall Cognitive Status: Within Functional Limits for tasks assessed  General Comments      Exercises     Assessment/Plan    PT Assessment Patient needs continued PT services  PT Problem List Decreased strength;Decreased knowledge of use of DME;Decreased activity tolerance;Cardiopulmonary status limiting activity;Decreased mobility       PT Treatment Interventions DME instruction;Gait training;Functional mobility training;Therapeutic activities;Therapeutic exercise;Patient/family education    PT Goals (Current goals can be found in the Care Plan section)  Acute Rehab PT Goals Patient Stated Goal: to go back to rehab PT Goal Formulation: With  patient Time For Goal Achievement: 11/20/19 Potential to Achieve Goals: Fair    Frequency Min 2X/week   Barriers to discharge        Co-evaluation               AM-PAC PT "6 Clicks" Mobility  Outcome Measure Help needed turning from your back to your side while in a flat bed without using bedrails?: A Little Help needed moving from lying on your back to sitting on the side of a flat bed without using bedrails?: A Little Help needed moving to and from a bed to a chair (including a wheelchair)?: A Lot Help needed standing up from a chair using your arms (e.g., wheelchair or bedside chair)?: A Lot Help needed to walk in hospital room?: Total Help needed climbing 3-5 steps with a railing? : Total 6 Click Score: 12    End of Session Equipment Utilized During Treatment: Oxygen Activity Tolerance: Treatment limited secondary to medical complications (Comment) Patient left: in bed;with call bell/phone within reach;with bed alarm set Nurse Communication: Mobility status PT Visit Diagnosis: Difficulty in walking, not elsewhere classified (R26.2)    Time: 1610-9604 PT Time Calculation (min) (ACUTE ONLY): 19 min   Charges:   PT Evaluation $PT Eval Low Complexity: [place] Pager [phone] Office [phone]  [place], Julia Daniels 11/06/2019, 4:22 PM

## 2019-11-06 NOTE — Progress Notes (Signed)
DEDREA FROM RT IN TO ASSESS PT.  WORKING ON EXTERNAL METHODS TO HELP DECREASE ANXIETY.  [name] CALLED  (SON).

## 2019-11-06 NOTE — Progress Notes (Signed)
ANTICOAGULATION CONSULT NOTE   Pharmacy Consult forApixaban Indication: pulmonary embolus  Allergies  Allergen Reactions   Sulfa Antibiotics Other (See Comments)    Mouth gets raw   Patient Measurements: Height: 5\' 3"  (160 cm) Weight: 93.5 kg (206 lb 2.1 oz) IBW/kg (Calculated) : 52.4 Heparin Dosing Weight: 72kg  Vital Signs: Temp: 97.8 F (36.6 C) (10/28 0740) Temp Source: Oral (10/28 0740) BP: 112/61 (10/28 0700) Pulse Rate: 82 (10/28 0700)  Labs: Recent Labs    11/04/19 0736 11/04/19 0736 11/05/19 0251 11/06/19 0242  HGB 11.9*   < > 11.7* 11.0*  HCT 39.5  --  38.3 36.4  PLT 314  --  306 294  CREATININE 0.66  --  0.57 0.53  TROPONINIHS 8  --   --   --    < > = values in this interval not displayed.   Estimated Creatinine Clearance: 74.1 mL/min (by C-G formula based on SCr of 0.53 mg/dL).  Medical History: Past Medical History:  Diagnosis Date   Anxiety    Asthma    COPD (chronic obstructive pulmonary disease) (HCC)    Depression    Medications:  Scheduled:   aspirin EC  81 mg Oral Daily   atorvastatin  40 mg Oral QHS   budesonide (PULMICORT) nebulizer solution  0.5 mg Nebulization BID   Chlorhexidine Gluconate Cloth  6 each Topical Daily   ipratropium-albuterol  3 mL Nebulization Q6H   lamoTRIgine  200 mg Oral QHS   lisinopril  2.5 mg Oral Daily   loratadine  10 mg Oral QPM   mouth rinse  15 mL Mouth Rinse BID   methylphenidate  72 mg Oral Daily   methylPREDNISolone (SOLU-MEDROL) injection  40 mg Intravenous Q12H   montelukast  10 mg Oral QHS   pantoprazole  40 mg Oral Daily   QUEtiapine  150 mg Oral QHS   risperiDONE  3 mg Oral QHS   sodium chloride flush  3 mL Intravenous Q12H   venlafaxine XR  150 mg Oral QHS   vitamin B-12  500 mcg Oral Daily   Assessment: 32 yoF to ED with ShOB and LE edema. Non-occlusive PE found on CTA, no right heart strain. Lovenox ordered per pharmacy.  Requiring O2 (4L at baseline).  Goal of  Therapy:  Monitor platelets by anticoagulation protocol: Yes   Plan:  Transition anticoagulation to Apixaban today Apixaban 10mg  bid x 7 days, followed by 5mg  bid Apixaban education Monitor CBC, signs/symptoms of bleeding  Eran Windish, [place] [phone] 11/06/2019,8:00 AM

## 2019-11-06 NOTE — Progress Notes (Signed)
PROGRESS NOTE    Julia Daniels  OAC:[id number] DOB: 08/04/1950 DOA: 11/04/2019 PCP: Simona Huh, NP   Chief Complain: Shortness of breath  Brief Narrative:  Patient is a 69 year old female with history of diastolic congestive heart failure, COPD, asthma who presented to the emergency department from [place] with complaints of shortness of breath.She is on 4 L of oxygen at baseline.  Shortness of breath started about a day ago before admision date and the inhalers did not help.  She was also reporting some lower extremity edema.  She was admitted to rehab on 9/4 due to ambulation problem, back pain/C6 compression fracture.  She follows with Dr. Melvyn Novas for her lung disease.  On presentation, she was hypotensive, tachycardic.  She was hypoxic requiring high flow oxygen.  CT angiogram showed nonocclusive PE on the left upper lobe.  Patient was admitted for the management of acute on chronic hypoxic respiratory failure from PE, COPD exacerbation.  Assessment & Plan:   Active Problems:   Respiratory failure with hypoxia (HCC)   Acute on chronic hypoxic respiratory failure: Required high flow oxygen on presentation.  Also wheezing on presentation.  CT angiogram showed PE.  Acute respiratory failure due to combination of PE and COPD exacerbation.  Currently says she is on 3 L of oxygen per minute which is her baseline.  Respiratory status has been improving significantly.  Acute PE: CT angio showing nonocclusive pulmonary embolus within a segmental branch pulmonary artery of the left upper lobe with involvement of 2 subsegmental branches. No central pulmonary emboli. No evidence of right heart strain. She was started on Lovenox on admission.  Will change to Eliquis.  Since this is an unprovoked episode, most likely she will need to be on anticoagulation lifelong.  We will stop aspirin that she was taking at home. Echo done on this admission showed EF of 50 to 55%, no wall  motion abnormality, grade 1 diastolic dysfunction.  Asthma/COPD exacerbation: Continue supplemental oxygen, bronchodilators,.  Started on Solu-Medrol, DuoNeb.  Follows with pulmonology,Dr Melvyn Novas, as an outpatient.  As per patient, she is having frequent exacerbation.  She needs to be discharged on tapering dose of prednisone and should be continued on  10 mg of prednisone before she is seen by her pulmonologist.  Diastolic congestive heart failure/bilateral lower extremity edema: Has history of diastolic congestive heart failure.  Echo as above .  Started on 40 mg of Lasix daily( takes 20 mg at home).  Leg edema has significantly improved .BNP not significantly elevated.  Hyperlipidemia: Continue Lipitor  Back pain/vertebral compression fracture: CT also showed interval progression of severe compression fracture of the T5 vertebral body.  She might need to follow-up with IR for kyphoplasty after resolution of her respiratory issues.  She can do that as an outpatient..  Currently she is residing at skilled nursing facility.  PT/OT consulted  Hypertension: BP stable. Continue current medications.  Monitor blood pressure.  GERD: Continue Protonix  Vitamin B12 deficiency: Started on supplementation.  Leukocytosis: Mild.  Low suspicion for infectious etiology.  Most likely reactive.  Continue to monitor  History of anxiety/bipolar disorder: On lamotrigine, quetiapine, risperidone, Effexor  Debility/deconditioning: Currently at [place].  TOC consulted and following.  Plan is to discharge back to the nursing facility.  PT/OT consulted.         DVT prophylaxis:Eliquis Code Status: Full Family Communication: None at bed side.  Patient said no need to call anybody. Status is: Inpatient  Remains inpatient  appropriate because:IV treatments appropriate due to intensity of illness or inability to take PO   Dispo: The patient is from: SNF              Anticipated d/c is to:SNF               Anticipated d/c date is: 1-2 day              Patient currently is not medically stable to d/c. Patient feels that she is not ready for discharge.  We will move her out of ICU today.  He still have some wheezes.  Discharge planning in 1 to 2 days.    Consultants: None  Procedures:None  Antimicrobials:  Anti-infectives (From admission, onward)   None      Subjective:  Patient seen and examined at the bedside this morning.  Currently she is on 3 L of oxygen per minute.  She feels much better today.  Wheezing have improved.  Objective: Vitals:   11/06/19 0400 11/06/19 0500 11/06/19 0600 11/06/19 0700  BP: (!) 109/50 135/83  112/61  Pulse: 81 88  82  Resp: 14 16  15   Temp: 98 F (36.7 C)     TempSrc: Oral     SpO2: 90% 96%  92%  Weight:   93.5 kg   Height:        Intake/Output Summary (Last 24 hours) at 11/06/2019 0751 Last data filed at 11/06/2019 0600 Gross per 24 hour  Intake --  Output 950 ml  Net -950 ml   Filed Weights   11/04/19 0700 11/05/19 0630 11/06/19 0600  Weight: 87.5 kg 91.9 kg 93.5 kg    Examination:  General exam: Deconditioned, debilitated, chronically ill looking, morbidly obese Respiratory system: Bilateral mild expiratory wheezes, decreased air entry  cardiovascular system: S1 & S2 heard, RRR. No JVD, murmurs, rubs, gallops or clicks. Gastrointestinal system: Abdomen is nondistended, soft and nontender. No organomegaly or masses felt. Normal bowel sounds heard. Central nervous system: Alert and oriented. No focal neurological deficits. Extremities: No edema, no clubbing ,no cyanosis Skin: No rashes, lesions or ulcers,no icterus ,no pallor   Data Reviewed: I have personally reviewed following labs and imaging studies  CBC: Recent Labs  Lab 11/04/19 0736 11/05/19 0251 11/06/19 0242  WBC 12.4* 14.1* 11.8*  NEUTROABS 9.8* 12.3* 10.3*  HGB 11.9* 11.7* 11.0*  HCT 39.5 38.3 36.4  MCV 105.9* 104.9* 105.8*  PLT 314 306 740    Basic Metabolic Panel: Recent Labs  Lab 11/04/19 0736 11/05/19 0251 11/06/19 0242  NA 144 140 140  K 3.9 4.7 4.7  CL 101 99 96*  CO2 33* 35* 37*  GLUCOSE 114* 132* 132*  BUN 12 13 16   CREATININE 0.66 0.57 0.53  CALCIUM 9.8 9.9 9.9  MG  --  2.4  --   PHOS  --  4.0  --    GFR: Estimated Creatinine Clearance: 74.1 mL/min (by C-G formula based on SCr of 0.53 mg/dL). Liver Function Tests: Recent Labs  Lab 11/04/19 0736 11/05/19 0251  AST 11* 10*  ALT 14 13  ALKPHOS 61 59  BILITOT 0.5 0.4  PROT 6.2* 5.9*  ALBUMIN 3.8 3.7   No results for input(s): LIPASE, AMYLASE in the last 168 hours. No results for input(s): AMMONIA in the last 168 hours. Coagulation Profile: No results for input(s): INR, PROTIME in the last 168 hours. Cardiac Enzymes: No results for input(s): CKTOTAL, CKMB, CKMBINDEX, TROPONINI in the last 168 hours. BNP (last 3  results) No results for input(s): PROBNP in the last 8760 hours. HbA1C: No results for input(s): HGBA1C in the last 72 hours. CBG: No results for input(s): GLUCAP in the last 168 hours. Lipid Profile: No results for input(s): CHOL, HDL, LDLCALC, TRIG, CHOLHDL, LDLDIRECT in the last 72 hours. Thyroid Function Tests: Recent Labs    11/04/19 0736  TSH 0.968  FREET4 0.97   Anemia Panel: Recent Labs    11/04/19 2102  VITAMINB12 133*  FOLATE 11.9  FERRITIN 44  TIBC 317  IRON 90  RETICCTPCT 2.2   Sepsis Labs: No results for input(s): PROCALCITON, LATICACIDVEN in the last 168 hours.  Recent Results (from the past 240 hour(s))  Respiratory Panel by RT PCR (Flu A&B, Covid) - Nasopharyngeal Swab     Status: None   Collection Time: 11/04/19  8:52 AM   Specimen: Nasopharyngeal Swab  Result Value Ref Range Status   SARS Coronavirus 2 by RT PCR NEGATIVE NEGATIVE Final    Comment: (NOTE) SARS-CoV-2 target nucleic acids are NOT DETECTED.  The SARS-CoV-2 RNA is generally detectable in upper respiratoy specimens during the acute phase  of infection. The lowest concentration of SARS-CoV-2 viral copies this assay can detect is 131 copies/mL. A negative result does not preclude SARS-Cov-2 infection and should not be used as the sole basis for treatment or other patient management decisions. A negative result may occur with  improper specimen collection/handling, submission of specimen other than nasopharyngeal swab, presence of viral mutation(s) within the areas targeted by this assay, and inadequate number of viral copies (<131 copies/mL). A negative result must be combined with clinical observations, patient history, and epidemiological information. The expected result is Negative.  Fact Sheet for Patients:  PinkCheek.be  Fact Sheet for Healthcare Providers:  GravelBags.it  This test is no t yet approved or cleared by the [place] FDA and  has been authorized for detection and/or diagnosis of SARS-CoV-2 by FDA under an Emergency Use Authorization (EUA). This EUA will remain  in effect (meaning this test can be used) for the duration of the COVID-19 declaration under Section 564(b)(1) of the Act, 21 U.S.C. section 360bbb-3(b)(1), unless the authorization is terminated or revoked sooner.     Influenza A by PCR NEGATIVE NEGATIVE Final   Influenza B by PCR NEGATIVE NEGATIVE Final    Comment: (NOTE) The Xpert Xpress SARS-CoV-2/FLU/RSV assay is intended as an aid in  the diagnosis of influenza from Nasopharyngeal swab specimens and  should not be used as a sole basis for treatment. Nasal washings and  aspirates are unacceptable for Xpert Xpress SARS-CoV-2/FLU/RSV  testing.  Fact Sheet for Patients: PinkCheek.be  Fact Sheet for Healthcare Providers: GravelBags.it  This test is not yet approved or cleared by the [place] FDA and  has been authorized for detection and/or diagnosis of  SARS-CoV-2 by  FDA under an Emergency Use Authorization (EUA). This EUA will remain  in effect (meaning this test can be used) for the duration of the  Covid-19 declaration under Section 564(b)(1) of the Act, 21  U.S.C. section 360bbb-3(b)(1), unless the authorization is  terminated or revoked. Performed at [facility], [place] [address] [place], [place] [address]   MRSA PCR Screening     Status: None   Collection Time: 11/04/19  9:10 PM   Specimen: Nasal Mucosa; Nasopharyngeal  Result Value Ref Range Status   MRSA by PCR NEGATIVE NEGATIVE Final    Comment:        The GeneXpert MRSA  Assay (FDA approved for NASAL specimens only), is one component of a comprehensive MRSA colonization surveillance program. It is not intended to diagnose MRSA infection nor to guide or monitor treatment for MRSA infections. Performed at [facility], [place] [address] [place], [place] [address]          Radiology Studies: CT ANGIO CHEST PE W OR WO CONTRAST  Result Date: 11/04/2019 CLINICAL DATA:  Shortness of breath, respiratory failure EXAM: CT ANGIOGRAPHY CHEST WITH CONTRAST TECHNIQUE: Multidetector CT imaging of the chest was performed using the standard protocol during bolus administration of intravenous contrast. Multiplanar CT image reconstructions and MIPs were obtained to evaluate the vascular anatomy. CONTRAST:  132mL OMNIPAQUE IOHEXOL 350 MG/ML SOLN COMPARISON:  09/08/2019 FINDINGS: Cardiovascular: Satisfactory opacification of the pulmonary arteries. There is in the centrally located nonocclusive filling defect within a segmental branch pulmonary artery of the left upper lobe with involvement of 2 subsegmental branches (series 6, images 89-94; series 5, images 70-76). No additional filling defects are seen. Specifically no large or central filling defect. Normal RV to LV ratio without evidence to suggest right heart strain. Thoracic aorta is nonaneurysmal.  Atherosclerotic calcifications of the aorta and coronary arteries. Normal heart size. No significant pericardial effusion. Mediastinum/Nodes: No enlarged mediastinal, hilar, or axillary lymph nodes. Thyroid gland, trachea, and esophagus demonstrate no significant findings. Lungs/Pleura: Mild bibasilar atelectasis or scarring. Chronic mild pleural thickening at the posterior right lung base. No focal airspace consolidation, pleural effusion, or pneumothorax. Upper Abdomen: No acute abnormality. Musculoskeletal: Severe compression deformity of the T4 vertebral body, new from prior. Chronic severe compression fracture of the T5 vertebral body, which has slightly progressed from prior. There is approximately 4 mm of bony retropulsion of the T5 fracture site, progressed. Review of the MIP images confirms the above findings. IMPRESSION: 1. Nonocclusive pulmonary embolus within a segmental branch pulmonary artery of the left upper lobe with involvement of 2 subsegmental branches. No central pulmonary emboli. No evidence of right heart strain. 2. Severe compression deformity of the T4 vertebral body, new from prior. 3. Interval progression of severe compression fracture of the T5 vertebral body. There is approximately 4 mm of bony retropulsion of the T5 fracture site, progressed from prior. 4. Lungs are clear. 5. Aortic atherosclerosis. (ICD10-I70.0). Electronically Signed   By: Davina Poke D.O.   On: 11/04/2019 15:10   ECHOCARDIOGRAM COMPLETE  Result Date: 11/04/2019    ECHOCARDIOGRAM REPORT   Patient Name:   LAMOINE FREDRICKSEN Date of Exam: 11/04/2019 Medical Rec #:  [id number]           Height:       63.0 in Accession #:    [id number]          Weight:       193.0 lb Date of Birth:  [birth year]          BSA:          1.905 m Patient Age:    52 years            BP:           184/108 mmHg Patient Gender: F                   HR:           105 bpm. Exam Location:  Inpatient Procedure: 2D Echo, Cardiac Doppler, Color  Doppler and Intracardiac            Opacification Agent Indications:  I50.40* Unspecified combined systolic (congestive) and diastolic                 (congestive) heart failure  History:        Patient has prior history of Echocardiogram examinations, most                 recent 09/09/2019. CHF, COPD, Signs/Symptoms:Shortness of Breath                 and Dyspnea; Risk Factors:Dyslipidemia, Current Smoker and                 Hypertension.  Sonographer:    Roseanna Rainbow RDCS Referring Phys: [phone] EKTA V PATEL  Sonographer Comments: Technically difficult study due to poor echo windows, Technically challenging study due to limited acoustic windows and patient is morbidly obese. Image acquisition challenging due to patient body habitus, Image acquisition challenging due to respiratory motion and Image acquisition challenging due to COPD. Patient in high Fowler's position due to dyspnea. IMPRESSIONS  1. Left ventricular ejection fraction, by estimation, is 50 to 55%. The left ventricle has low normal function. The left ventricle has no regional wall motion abnormalities. Left ventricular diastolic parameters are consistent with Grade I diastolic dysfunction (impaired relaxation). Elevated left atrial pressure.  2. Right ventricular systolic function is normal. The right ventricular size is normal. Tricuspid regurgitation signal is inadequate for assessing PA pressure.  3. The mitral valve is normal in structure. No evidence of mitral valve regurgitation.  4. The aortic valve is normal in structure. Aortic valve regurgitation is not visualized.  5. The inferior vena cava is normal in size with greater than 50% respiratory variability, suggesting right atrial pressure of 3 mmHg. FINDINGS  Left Ventricle: Left ventricular ejection fraction, by estimation, is 50 to 55%. The left ventricle has low normal function. The left ventricle has no regional wall motion abnormalities. Definity contrast agent was given IV to delineate  the left ventricular endocardial borders. The left ventricular internal cavity size was normal in size. There is no left ventricular hypertrophy. Left ventricular diastolic parameters are consistent with Grade I diastolic dysfunction (impaired relaxation). Elevated left atrial pressure. Right Ventricle: The right ventricular size is normal. No increase in right ventricular wall thickness. Right ventricular systolic function is normal. Tricuspid regurgitation signal is inadequate for assessing PA pressure. Left Atrium: Left atrial size was normal in size. Right Atrium: Right atrial size was normal in size. Pericardium: There is no evidence of pericardial effusion. Mitral Valve: The mitral valve is normal in structure. No evidence of mitral valve regurgitation. Tricuspid Valve: The tricuspid valve is grossly normal. Tricuspid valve regurgitation is not demonstrated. Aortic Valve: The aortic valve is normal in structure. Aortic valve regurgitation is not visualized. Pulmonic Valve: The pulmonic valve was not well visualized. Pulmonic valve regurgitation is not visualized. Aorta: The aortic root is normal in size and structure. Venous: The inferior vena cava is normal in size with greater than 50% respiratory variability, suggesting right atrial pressure of 3 mmHg. IAS/Shunts: No atrial level shunt detected by color flow Doppler.  LEFT VENTRICLE PLAX 2D LVIDd:         4.50 cm      Diastology LVIDs:         2.90 cm      LV e' medial:    6.34 cm/s LV PW:         1.40 cm      LV E/e' medial:  15.9 LV IVS:  1.05 cm      LV e' lateral:   7.65 cm/s LVOT diam:     1.90 cm      LV E/e' lateral: 13.2 LV SV:         73 LV SV Index:   38 LVOT Area:     2.84 cm  LV Volumes (MOD) LV vol d, MOD A2C: 110.0 ml LV vol d, MOD A4C: 98.5 ml LV vol s, MOD A2C: 55.6 ml LV vol s, MOD A4C: 52.2 ml LV SV MOD A2C:     54.4 ml LV SV MOD A4C:     98.5 ml LV SV MOD BP:      53.1 ml RIGHT VENTRICLE             IVC RV S prime:     16.80 cm/s   IVC diam: 1.80 cm TAPSE (M-mode): 1.5 cm LEFT ATRIUM           Index       RIGHT ATRIUM          Index LA diam:      2.90 cm 1.52 cm/m  RA Area:     9.97 cm LA Vol (A2C): 12.7 ml 6.67 ml/m  RA Volume:   20.00 ml 10.50 ml/m LA Vol (A4C): 23.4 ml 12.29 ml/m  AORTIC VALVE LVOT Vmax:   160.00 cm/s LVOT Vmean:  105.000 cm/s LVOT VTI:    0.256 m  AORTA Ao Root diam: 3.20 cm Ao Asc diam:  3.80 cm MITRAL VALVE MV Area (PHT): 5.66 cm     SHUNTS MV Decel Time: 134 msec     Systemic VTI:  0.26 m MV E velocity: 101.00 cm/s  Systemic Diam: 1.90 cm MV A velocity: 137.00 cm/s MV E/A ratio:  0.74 Mihai Croitoru MD Electronically signed by Sanda Klein MD Signature Date/Time: 11/04/2019/5:55:59 PM    Final         Scheduled Meds: . aspirin EC  81 mg Oral Daily  . atorvastatin  40 mg Oral QHS  . budesonide (PULMICORT) nebulizer solution  0.5 mg Nebulization BID  . Chlorhexidine Gluconate Cloth  6 each Topical Daily  . ipratropium-albuterol  3 mL Nebulization Q6H  . lamoTRIgine  200 mg Oral QHS  . lisinopril  2.5 mg Oral Daily  . loratadine  10 mg Oral QPM  . mouth rinse  15 mL Mouth Rinse BID  . methylphenidate  72 mg Oral Daily  . methylPREDNISolone (SOLU-MEDROL) injection  40 mg Intravenous Q12H  . montelukast  10 mg Oral QHS  . pantoprazole  40 mg Oral Daily  . QUEtiapine  150 mg Oral QHS  . risperiDONE  3 mg Oral QHS  . sodium chloride flush  3 mL Intravenous Q12H  . venlafaxine XR  150 mg Oral QHS  . vitamin B-12  500 mcg Oral Daily   Continuous Infusions: . sodium chloride       LOS: 2 days    Time spent: 35 mins.More than 50% of that time was spent in counseling and/or coordination of care.      Shelly Coss, MD [facility] P10/28/2021, 7:51 AM

## 2019-11-06 NOTE — Progress Notes (Signed)
Pt voiced concern about discharge plan and is worried about losing her bed in rehab facility. Will follow up with patients concerns and continue to monitor.

## 2019-11-06 NOTE — Progress Notes (Signed)
   11/06/19 1305  Provider Notification  Provider Name/Title Dr. Tawanna Solo  Date Provider Notified 11/06/19  Time Provider Notified [phone]  Notification Type Page  Notification Reason Other (Comment) (med request)  Response See new orders  Date of Provider Response 11/06/19  Time of Provider Response 1306  Pt transferred to unit, increase HR and breathing, treatment given, PRN administered, New Order noted.

## 2019-11-06 NOTE — Plan of Care (Signed)
  Problem: Education: Goal: Knowledge of General Education information will improve Description: Including pain rating scale, medication(s)/side effects and non-pharmacologic comfort measures 11/06/2019 1506 by Glenna Fellows D, RN Outcome: Progressing 11/06/2019 1505 by Tula Nakayama, RN Outcome: Progressing   Problem: Health Behavior/Discharge Planning: Goal: Ability to manage health-related needs will improve 11/06/2019 1506 by Glenna Fellows D, RN Outcome: Progressing 11/06/2019 1505 by Glenna Fellows D, RN Outcome: Progressing   Problem: Clinical Measurements: Goal: Ability to maintain clinical measurements within normal limits will improve 11/06/2019 1506 by Glenna Fellows D, RN Outcome: Progressing 11/06/2019 1505 by Glenna Fellows D, RN Outcome: Progressing Goal: Will remain free from infection 11/06/2019 1506 by Glenna Fellows D, RN Outcome: Progressing 11/06/2019 1505 by Glenna Fellows D, RN Outcome: Progressing Goal: Diagnostic test results will improve 11/06/2019 1506 by Glenna Fellows D, RN Outcome: Progressing 11/06/2019 1505 by Glenna Fellows D, RN Outcome: Progressing Goal: Respiratory complications will improve 11/06/2019 1506 by Glenna Fellows D, RN Outcome: Progressing 11/06/2019 1505 by Glenna Fellows D, RN Outcome: Progressing Goal: Cardiovascular complication will be avoided 11/06/2019 1506 by Glenna Fellows D, RN Outcome: Progressing 11/06/2019 1505 by Tula Nakayama, RN Outcome: Progressing   Problem: Activity: Goal: Risk for activity intolerance will decrease 11/06/2019 1506 by Glenna Fellows D, RN Outcome: Progressing 11/06/2019 1505 by Glenna Fellows D, RN Outcome: Progressing   Problem: Nutrition: Goal: Adequate nutrition will be maintained 11/06/2019 1506 by Glenna Fellows D, RN Outcome: Progressing 11/06/2019 1505 by Glenna Fellows D, RN Outcome: Progressing   Problem: Coping: Goal: Level of anxiety will decrease 11/06/2019  1506 by Glenna Fellows D, RN Outcome: Progressing 11/06/2019 1505 by Glenna Fellows D, RN Outcome: Progressing   Problem: Elimination: Goal: Will not experience complications related to bowel motility 11/06/2019 1506 by Glenna Fellows D, RN Outcome: Progressing 11/06/2019 1505 by Glenna Fellows D, RN Outcome: Progressing Goal: Will not experience complications related to urinary retention 11/06/2019 1506 by Glenna Fellows D, RN Outcome: Progressing 11/06/2019 1505 by Glenna Fellows D, RN Outcome: Progressing   Problem: Pain Managment: Goal: General experience of comfort will improve 11/06/2019 1506 by Glenna Fellows D, RN Outcome: Progressing 11/06/2019 1505 by Glenna Fellows D, RN Outcome: Progressing   Problem: Safety: Goal: Ability to remain free from injury will improve 11/06/2019 1506 by Glenna Fellows D, RN Outcome: Progressing 11/06/2019 1505 by Glenna Fellows D, RN Outcome: Progressing   Problem: Skin Integrity: Goal: Risk for impaired skin integrity will decrease 11/06/2019 1506 by Glenna Fellows D, RN Outcome: Progressing 11/06/2019 1505 by Tula Nakayama, RN Outcome: Progressing

## 2019-11-06 NOTE — Progress Notes (Signed)
°   11/06/19 1100  Respiratory  Respiratory Pattern Labored  Chest Assessment Chest expansion symmetrical  Bilateral Breath Sounds Diminished  L Upper Breath Sounds Expiratory wheezes  R Lower Breath Sounds Expiratory wheezes  L Lower Breath Sounds Expiratory wheezes  RESPIRATORY MADE AWARE OF PT WITH SHORTNESS OF BREATH. HR-134.  SPO2 91%.

## 2019-11-07 MED ORDER — IPRATROPIUM-ALBUTEROL 0.5-2.5 (3) MG/3ML IN SOLN
3.0000 mL | Freq: Four times a day (QID) | RESPIRATORY_TRACT | Status: DC | PRN
  Administered 2019-11-08: 3 mL via RESPIRATORY_TRACT
  Filled 2019-11-07: qty 3

## 2019-11-07 MED ORDER — LISINOPRIL 20 MG PO TABS
40.0000 mg | ORAL_TABLET | Freq: Every day | ORAL | Status: DC
  Administered 2019-11-07 – 2019-11-10 (×4): 40 mg via ORAL
  Filled 2019-11-07 (×4): qty 2

## 2019-11-07 MED ORDER — METHYLPREDNISOLONE SODIUM SUCC 125 MG IJ SOLR
40.0000 mg | Freq: Four times a day (QID) | INTRAMUSCULAR | Status: DC
  Administered 2019-11-07 – 2019-11-09 (×8): 40 mg via INTRAVENOUS
  Filled 2019-11-07 (×7): qty 2

## 2019-11-07 NOTE — NC FL2 (Signed)
[place] LEVEL OF CARE SCREENING TOOL     IDENTIFICATION  Patient Name: Julia Daniels Birthdate: 01-26-1950 Sex: female Admission Date (Current Location): 11/04/2019  [facility] and [place] Number:  Herbalist and Address:  [facility],  [place], [place]      Provider Number: [id number]  Attending Physician Name and Address:  Bonnell Public, MD  Relative Name and Phone Number:  Inocente Salles [phone]    Current Level of Care: Hospital Recommended Level of Care: [place] Prior Approval Number:    Date Approved/Denied:   PASRR Number: [id number]  Discharge Plan: SNF    Current Diagnoses: Patient Active Problem List   Diagnosis Date Noted  . Respiratory failure with hypoxia ([place]) 11/04/2019  . Pressure injury of skin 10/22/2019  . Chronic diastolic CHF (congestive heart failure) ([place]) 10/22/2019  . Chronic back pain 10/22/2019  . Palliative care by specialist   . Goals of care, counseling/discussion   . DNR (do not resuscitate)   . Acute respiratory failure with hypercapnia ([place]) 09/29/2019  . Drug induced constipation   . Leukocytosis   . Supplemental oxygen dependent   . Anxiety state   . Debility 09/13/2019  . Back pain 09/08/2019  . Essential hypertension 07/27/2019  . Allergic rhinitis 03/14/2018  . Acute respiratory failure with hypoxia ([place]) 03/08/2018  . Elevated MCV 01/31/2018  . Vitamin B12 deficiency 01/31/2018  . Acute on chronic respiratory failure with hypoxia ([place]) 01/31/2018  . Respiratory failure, acute-on-chronic ([place]) 12/31/2017  . COPD exacerbation ([place]) 09/18/2017  . Hypoxia   . Hyperlipidemia 08/23/2017  . Depression 08/23/2017  . Anxiety 08/23/2017  . Hypercalcemia 08/23/2017  . Thrombocytosis 08/23/2017  . COPD with acute exacerbation ([place]) 08/23/2017  . ADD (attention deficit disorder) 08/30/2012  . Bipolar disorder, unspecified ([place]) 08/30/2012  . COPD   GOLD ? III with increased Eos and reversibility ? ACOS ?  08/30/2012  . Tobacco abuse 08/30/2012    Orientation RESPIRATION BLADDER Height & Weight     Self, Time, Situation, Place  O2 (3L) Continent Weight: 206 lb 12.7 oz (93.8 kg) Height:  5\' 3"  (160 cm)  BEHAVIORAL SYMPTOMS/MOOD NEUROLOGICAL BOWEL NUTRITION STATUS      Continent Diet (Cardiac diet)  AMBULATORY STATUS COMMUNICATION OF NEEDS Skin   Limited Assist Verbally Skin abrasions, PU Stage and Appropriate Care   PU Stage 2 Dressing:  (PRN dressing change)                   Personal Care Assistance Level of Assistance  Bathing, Feeding, Dressing Bathing Assistance: Limited assistance Feeding assistance: Independent Dressing Assistance: Limited assistance     Functional Limitations Info  Sight, Hearing, Speech Sight Info: Adequate Hearing Info: Adequate Speech Info: Adequate    SPECIAL CARE FACTORS FREQUENCY  PT (By licensed PT), OT (By licensed OT)     PT Frequency: Minimum 5x a week OT Frequency: Minimum 5x a week            Contractures Contractures Info: Not present    Additional Factors Info  Code Status, Allergies, Psychotropic Code Status Info: DNR Allergies Info: Sulfa Antibiotics Psychotropic Info: QUEtiapine (SEROQUEL) tablet 150 mg, risperiDONE (RISPERDAL) tablet 3 mg, venlafaxine XR (EFFEXOR-XR) 24 hr capsule 150 mg         Current Medications (11/07/2019):  This is the current hospital active medication list Current Facility-Administered Medications  Medication Dose Route Frequency Provider Last Rate Last Admin  .  0.9 %  sodium chloride infusion  250 mL Intravenous PRN Para Skeans, MD      . acetaminophen (TYLENOL) tablet 650 mg  650 mg Oral Q4H PRN Para Skeans, MD   650 mg at 11/07/19 0827  . albuterol (VENTOLIN HFA) 108 (90 Base) MCG/ACT inhaler 4 puff  4 puff Inhalation Q4H PRN Davonna Belling, MD   4 puff at 11/06/19 1131  . ALPRAZolam Duanne Moron) tablet 0.5 mg  0.5 mg Oral TID PRN  Shelly Coss, MD   0.5 mg at 11/07/19 0827  . apixaban (ELIQUIS) tablet 10 mg  10 mg Oral BID Minda Ditto, RPH   10 mg at 11/07/19 4166   Followed by  . [START ON 11/13/2019] apixaban (ELIQUIS) tablet 5 mg  5 mg Oral BID Minda Ditto, RPH      . atorvastatin (LIPITOR) tablet 40 mg  40 mg Oral QHS Para Skeans, MD   40 mg at 11/06/19 2121  . barrier cream (non-specified) 1 application  1 application Topical Daily PRN Florina Ou V, MD      . budesonide (PULMICORT) nebulizer solution 0.5 mg  0.5 mg Nebulization BID Shelly Coss, MD   0.5 mg at 11/07/19 0816  . hydrALAZINE (APRESOLINE) injection 10 mg  10 mg Intravenous Q6H PRN Shelly Coss, MD   10 mg at 11/06/19 0905  . HYDROcodone-acetaminophen (NORCO/VICODIN) 5-325 MG per tablet 1 tablet  1 tablet Oral TID PRN Para Skeans, MD   1 tablet at 11/07/19 0514  . ipratropium-albuterol (DUONEB) 0.5-2.5 (3) MG/3ML nebulizer solution 3 mL  3 mL Nebulization Q6H Adhikari, Amrit, MD   3 mL at 11/07/19 0816  . lamoTRIgine (LAMICTAL) tablet 200 mg  200 mg Oral QHS Para Skeans, MD   200 mg at 11/06/19 2122  . lisinopril (ZESTRIL) tablet 2.5 mg  2.5 mg Oral Daily Florina Ou V, MD   2.5 mg at 11/07/19 0827  . loratadine (CLARITIN) tablet 10 mg  10 mg Oral QPM Para Skeans, MD   10 mg at 11/06/19 1849  . MEDLINE mouth rinse  15 mL Mouth Rinse BID Para Skeans, MD   15 mL at 11/07/19 0829  . methylphenidate (CONCERTA) CR tablet 72 mg  72 mg Oral Daily Para Skeans, MD   72 mg at 11/07/19 0826  . montelukast (SINGULAIR) tablet 10 mg  10 mg Oral QHS Para Skeans, MD   10 mg at 11/06/19 2122  . ondansetron (ZOFRAN) injection 4 mg  4 mg Intravenous Q6H PRN Para Skeans, MD      . pantoprazole (PROTONIX) EC tablet 40 mg  40 mg Oral Daily Minda Ditto, RPH   40 mg at 11/07/19 0827  . predniSONE (DELTASONE) tablet 40 mg  40 mg Oral Q breakfast Shelly Coss, MD   40 mg at 11/07/19 0827  . QUEtiapine (SEROQUEL) tablet 150 mg  150 mg Oral QHS  Para Skeans, MD   150 mg at 11/06/19 2122  . risperiDONE (RISPERDAL) tablet 3 mg  3 mg Oral QHS Para Skeans, MD   3 mg at 11/06/19 2122  . sodium chloride (OCEAN) 0.65 % nasal spray 1 spray  1 spray Each Nare PRN Lang Snow, FNP      . sodium chloride flush (NS) 0.9 % injection 3 mL  3 mL Intravenous Q12H Para Skeans, MD   3 mL at 11/07/19 0829  . sodium chloride flush (NS) 0.9 %  injection 3 mL  3 mL Intravenous PRN Para Skeans, MD      . venlafaxine XR (EFFEXOR-XR) 24 hr capsule 150 mg  150 mg Oral QHS Para Skeans, MD   150 mg at 11/06/19 2122  . vitamin B-12 (CYANOCOBALAMIN) tablet 500 mcg  500 mcg Oral Daily Shelly Coss, MD   500 mcg at 11/07/19 0827     Discharge Medications: Please see discharge summary for a list of discharge medications.  Relevant Imaging Results:  Relevant Lab Results:   Additional Information SSN [id number]  Ross Ludwig, LCSW

## 2019-11-07 NOTE — TOC Progression Note (Signed)
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  Per SNF they can accept patient this weekend once they receive authorization and if patient is medically ready.  CSW to continue to follow patient's progress throughout discharge planning.  Expected Discharge Plan: [place] ([facility]) Barriers to Discharge: Barriers Unresolved (comment) ([place])  Expected Discharge Plan and Services Expected Discharge Plan: [place] ([facility])   Discharge Planning Services: CM Consult Post Acute Care Choice: [place] Living arrangements for the past 2 months: [place] Expected Discharge Date:  (unknown)                                     Social Determinants of Health (SDOH) Interventions    Readmission Risk Interventions No flowsheet data found.

## 2019-11-07 NOTE — H&P (Deleted)
30 Day [place] Note  RE: Julia Daniels        Date of Birth: [birth year]   Date: 11/07/2019       To Whom It May Concern:  Please be advised that the above-named patient will require a short-term nursing home stay - anticipated 30 days or less for rehabilitation and strengthening.  The plan is for return home.   Evette Cristal, MSW, [place] Mike [phone]  MD signature: _________________________

## 2019-11-07 NOTE — Plan of Care (Signed)
  Problem: Education: Goal: Knowledge of General Education information will improve Description Including pain rating scale, medication(s)/side effects and non-pharmacologic comfort measures Outcome: Progressing   

## 2019-11-07 NOTE — Evaluation (Signed)
Occupational Therapy Evaluation Patient Details Name: Julia Daniels MRN: [id number] DOB: 1950/12/10 Today's Date: 11/07/2019    History of Present Illness Patient is a 69 year old female with history of diastolic congestive heart failure, COPD, asthma who presented to the emergency department from [place] with complaints of shortness of breath, hypertensive, hypoxic.Marland KitchenShe is on 4 L of oxygen at baseline.   presentation, she was hypotensive, tachycardic.  She was hypoxic requiring high flow oxygen.  CT angiogram showed nonocclusive PE on the left upper lobe. H/O T6compression fracture, treated with TLSO.   Clinical Impression   Julia Daniels is a 69 year old woman who presents on 3L [place] with complaints of back pain. On evaluation patient demonstrates generalized weakness and decreased activity tolerance limiting her ability to perform independent ADLs and ambulation. Patient required verbal cues for rest breaks when she appeared short of breath.Patient will benefit from skilled OT services while in hospital in order to improve deficits, improve activity tolerance and learn compensatory strategies as needed in order to improve independence. Recommend return to short term rehab.      Follow Up Recommendations  SNF    Equipment Recommendations  None recommended by OT    Recommendations for Other Services       Precautions / Restrictions Precautions Precautions: Fall Precaution Comments: on O2, Has TLSO when up Restrictions Weight Bearing Restrictions: No      Mobility Bed Mobility Overal bed mobility: Needs Assistance Bed Mobility: Supine to Sit     Supine to sit: Supervision          Transfers Overall transfer level: Needs assistance Equipment used: None Transfers: Sit to/from Stand;Stand Pivot Transfers Sit to Stand: Min guard Stand pivot transfers: Min guard            Balance Overall balance assessment: Mild deficits observed, not  formally tested                                         ADL either performed or assessed with clinical judgement   ADL Overall ADL's : Needs assistance/impaired Eating/Feeding: Independent   Grooming: Set up;Oral care;Wash/dry face   Upper Body Bathing: Set up;Sitting   Lower Body Bathing: Minimal assistance;Set up;Sit to/from stand   Upper Body Dressing : Set up;Sitting   Lower Body Dressing: Moderate assistance;Sit to/from stand   Toilet Transfer: Min guard;RW;Ambulation;Regular Toilet;Grab bars   Toileting- Water quality scientist and Hygiene: Min guard;Sit to/from stand               Vision Patient Visual Report: No change from baseline       Perception     Praxis      Pertinent Vitals/Pain Pain Assessment: Faces Faces Pain Scale: Hurts a little bit Pain Location: back Pain Descriptors / Indicators: Grimacing     Hand Dominance Right   Extremity/Trunk Assessment Upper Extremity Assessment Upper Extremity Assessment: Overall WFL for tasks assessed   Lower Extremity Assessment Lower Extremity Assessment: Defer to PT evaluation   Cervical / Trunk Assessment Cervical / Trunk Assessment: Normal   Communication Communication Communication: No difficulties   Cognition Arousal/Alertness: Awake/alert Behavior During Therapy: WFL for tasks assessed/performed Overall Cognitive Status: Within Functional Limits for tasks assessed  General Comments       Exercises     Shoulder Instructions      Home Living Family/patient expects to be discharged to:: Skilled nursing facility                                        Prior Functioning/Environment Level of Independence: Independent with assistive device(s);Needs assistance  Gait / Transfers Assistance Needed: ambulates with walker and oxygen ADL's / Homemaking Assistance Needed: uses LHR and sock aide - reports able to  perform ADLs but limited by endurance.   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, was walking with a walker in rehab        OT Problem List: Decreased activity tolerance;Cardiopulmonary status limiting activity;Pain      OT Treatment/Interventions: Self-care/ADL training;Therapeutic exercise;Energy conservation;DME and/or AE instruction;Patient/family education;Therapeutic activities    OT Goals(Current goals can be found in the care plan section) Acute Rehab OT Goals Patient Stated Goal: to go back to rehab OT Goal Formulation: With patient Time For Goal Achievement: 11/21/19 Potential to Achieve Goals: Good  OT Frequency: Min 2X/week   Barriers to D/C:            Co-evaluation              AM-PAC OT "6 Clicks" Daily Activity     Outcome Measure Help from another person eating meals?: None Help from another person taking care of personal grooming?: A Little Help from another person toileting, which includes using toliet, bedpan, or urinal?: A Little Help from another person bathing (including washing, rinsing, drying)?: A Little Help from another person to put on and taking off regular upper body clothing?: A Little Help from another person to put on and taking off regular lower body clothing?: A Little 6 Click Score: 19   End of Session Equipment Utilized During Treatment: Oxygen Nurse Communication: Mobility status  Activity Tolerance: Patient tolerated treatment well Patient left: in chair;with call bell/phone within reach  OT Visit Diagnosis: Unsteadiness on feet (R26.81);Pain;Muscle weakness (generalized) (M62.81) Pain - part of body:  (back)                Time: 4585-9292 OT Time Calculation (min): 21 min Charges:  OT General Charges $OT Visit: 1 Visit OT Evaluation $OT Eval Low Complexity: 1 Low  Seth Higginbotham, OTR/L [place]  Office [phone] Pager: [place] 11/07/2019, 12:37  PM

## 2019-11-07 NOTE — Progress Notes (Signed)
PROGRESS NOTE    Julia Daniels  WNU:[id number] DOB: May 20, 1950 DOA: 11/04/2019 PCP: Simona Huh, NP   Chief Complain: Shortness of breath  Brief Narrative:  Patient is a 69 year old female with history of diastolic congestive heart failure, COPD, asthma who presented to the emergency department from [place] with complaints of shortness of breath.She is on 4 L of oxygen at baseline.  Shortness of breath started about a day ago before admision date and the inhalers did not help.  She was also reporting some lower extremity edema.  She was admitted to rehab on 9/4 due to ambulation problem, back pain/C6 compression fracture.  She follows with Dr. Melvyn Novas for her lung disease.  On presentation, she was hypotensive, tachycardic.  She was hypoxic requiring high flow oxygen.  CT angiogram showed nonocclusive PE on the left upper lobe.  Patient was admitted for the management of acute on chronic hypoxic respiratory failure from PE, COPD exacerbation.  11/07/2019: Above documentation was reviewed.  Patient seen.  Patient continues to report intermittent shortness of breath.  Blood pressure is uncontrolled.  Will increase the dose of lisinopril.  Will change oral prednisone to IV Solu-Medrol.  Will change as needed albuterol inhaler to DuoNebs as needed.  Further management depend on hospital course.  Assessment & Plan:   Active Problems:   Respiratory failure with hypoxia (HCC)   Acute on chronic hypoxic respiratory failure: Required high flow oxygen on presentation.  Also wheezing on presentation.  CT angiogram showed PE.  Acute respiratory failure due to combination of PE and COPD exacerbation.  Currently says she is on 3 L of oxygen per minute which is her baseline.  Respiratory status has been improving significantly.  Acute PE: CT angio showing nonocclusive pulmonary embolus within a segmental branch pulmonary artery of the left upper lobe with involvement of  2 subsegmental branches. No central pulmonary emboli. No evidence of right heart strain. She was started on Lovenox on admission.  Will change to Eliquis.  Since this is an unprovoked episode, most likely she will need to be on anticoagulation lifelong.  We will stop aspirin that she was taking at home. Echo done on this admission showed EF of 50 to 55%, no wall motion abnormality, grade 1 diastolic dysfunction.  Asthma/COPD exacerbation: Continue supplemental oxygen, bronchodilators,.  Started on Solu-Medrol, DuoNeb.  Follows with pulmonology,Dr Melvyn Novas, as an outpatient.  As per patient, she is having frequent exacerbation.  She needs to be discharged on tapering dose of prednisone and should be continued on  10 mg of prednisone before she is seen by her pulmonologist. 11/07/2019: See above documentation.  Change prednisone to IV Solu-Medrol.  Change as needed albuterol inhaler to nebs DuoNeb as needed.  Continue pulmonary toiletry.  Diastolic congestive heart failure/bilateral lower extremity edema: Has history of diastolic congestive heart failure.  Echo as above .  Started on 40 mg of Lasix daily( takes 20 mg at home).  Leg edema has significantly improved .BNP not significantly elevated. 11/07/2019: Consider beta-blocker when respiratory symptoms permit.  Hyperlipidemia: Continue Lipitor  Back pain/vertebral compression fracture: CT also showed interval progression of severe compression fracture of the T5 vertebral body.  She might need to follow-up with IR for kyphoplasty after resolution of her respiratory issues.  She can do that as an outpatient..  Currently she is residing at skilled nursing facility.  PT/OT consulted  Hypertension: BP stable. Continue current medications.  Monitor blood pressure. 11/07/2019: Blood pressure is not controlled.  Increase  lisinopril to 40 Mg p.o. once daily.  Continue to monitor blood pressure closely.  GERD: Continue Protonix  Vitamin B12 deficiency:  Started on supplementation.  Leukocytosis: Mild.  Low suspicion for infectious etiology.  Most likely reactive.  Continue to monitor  History of anxiety/bipolar disorder: On lamotrigine, quetiapine, risperidone, Effexor  Debility/deconditioning: Currently at [facility].  TOC consulted and following.  Plan is to discharge back to the nursing facility.  PT/OT consulted.         DVT prophylaxis:Eliquis Code Status: Full Family Communication: None at bed side.  Patient said no need to call anybody. Status is: Inpatient  Remains inpatient appropriate because:IV treatments appropriate due to intensity of illness or inability to take PO   Dispo: The patient is from: SNF              Anticipated d/c is to:SNF              Anticipated d/c date is: 1-2 day              Patient currently is not medically stable to d/c. Patient feels that she is not ready for discharge.  We will move her out of ICU today.  He still have some wheezes.  Discharge planning in 1 to 2 days.    Consultants: None  Procedures:None  Antimicrobials:  Anti-infectives (From admission, onward)   None      Subjective: Patient continues to report shortness of breath.  Objective: Vitals:   11/07/19 0503 11/07/19 0816 11/07/19 1249 11/07/19 1444  BP: (!) 150/90  (!) 160/92   Pulse: 87  (!) 105   Resp: 20  19   Temp: 98.3 F (36.8 C)  99.2 F (37.3 C)   TempSrc: Oral  Oral   SpO2: 97% 93% 94% 94%  Weight: 93.8 kg     Height:        Intake/Output Summary (Last 24 hours) at 11/07/2019 1711 Last data filed at 11/07/2019 1410 Gross per 24 hour  Intake 318 ml  Output 1975 ml  Net -1657 ml   Filed Weights   11/05/19 0630 11/06/19 0600 11/07/19 0503  Weight: 91.9 kg 93.5 kg 93.8 kg    Examination:  General exam: Morbidly obese.   Respiratory system: Bilateral mild expiratory wheezes, decreased air entry  cardiovascular system: S1 & S2 heard. Gastrointestinal system: Abdomen is  morbidly obese, soft and nontender.  Organs are difficult to assess.   Central nervous system: Alert and oriented.  Patient moves all extremities.   Data Reviewed: I have personally reviewed following labs and imaging studies  CBC: Recent Labs  Lab 11/04/19 0736 11/05/19 0251 11/06/19 0242  WBC 12.4* 14.1* 11.8*  NEUTROABS 9.8* 12.3* 10.3*  HGB 11.9* 11.7* 11.0*  HCT 39.5 38.3 36.4  MCV 105.9* 104.9* 105.8*  PLT 314 306 381   Basic Metabolic Panel: Recent Labs  Lab 11/04/19 0736 11/05/19 0251 11/06/19 0242  NA 144 140 140  K 3.9 4.7 4.7  CL 101 99 96*  CO2 33* 35* 37*  GLUCOSE 114* 132* 132*  BUN 12 13 16   CREATININE 0.66 0.57 0.53  CALCIUM 9.8 9.9 9.9  MG  --  2.4  --   PHOS  --  4.0  --    GFR: Estimated Creatinine Clearance: 74.3 mL/min (by C-G formula based on SCr of 0.53 mg/dL). Liver Function Tests: Recent Labs  Lab 11/04/19 0736 11/05/19 0251  AST 11* 10*  ALT 14 13  ALKPHOS 61 59  BILITOT 0.5 0.4  PROT 6.2* 5.9*  ALBUMIN 3.8 3.7   No results for input(s): LIPASE, AMYLASE in the last 168 hours. No results for input(s): AMMONIA in the last 168 hours. Coagulation Profile: No results for input(s): INR, PROTIME in the last 168 hours. Cardiac Enzymes: No results for input(s): CKTOTAL, CKMB, CKMBINDEX, TROPONINI in the last 168 hours. BNP (last 3 results) No results for input(s): PROBNP in the last 8760 hours. HbA1C: No results for input(s): HGBA1C in the last 72 hours. CBG: No results for input(s): GLUCAP in the last 168 hours. Lipid Profile: No results for input(s): CHOL, HDL, LDLCALC, TRIG, CHOLHDL, LDLDIRECT in the last 72 hours. Thyroid Function Tests: No results for input(s): TSH, T4TOTAL, FREET4, T3FREE, THYROIDAB in the last 72 hours. Anemia Panel: Recent Labs    11/04/19 2102  VITAMINB12 133*  FOLATE 11.9  FERRITIN 44  TIBC 317  IRON 90  RETICCTPCT 2.2   Sepsis Labs: No results for input(s): PROCALCITON, LATICACIDVEN in the last  168 hours.  Recent Results (from the past 240 hour(s))  Respiratory Panel by RT PCR (Flu A&B, Covid) - Nasopharyngeal Swab     Status: None   Collection Time: 11/04/19  8:52 AM   Specimen: Nasopharyngeal Swab  Result Value Ref Range Status   SARS Coronavirus 2 by RT PCR NEGATIVE NEGATIVE Final    Comment: (NOTE) SARS-CoV-2 target nucleic acids are NOT DETECTED.  The SARS-CoV-2 RNA is generally detectable in upper respiratoy specimens during the acute phase of infection. The lowest concentration of SARS-CoV-2 viral copies this assay can detect is 131 copies/mL. A negative result does not preclude SARS-Cov-2 infection and should not be used as the sole basis for treatment or other patient management decisions. A negative result may occur with  improper specimen collection/handling, submission of specimen other than nasopharyngeal swab, presence of viral mutation(s) within the areas targeted by this assay, and inadequate number of viral copies (<131 copies/mL). A negative result must be combined with clinical observations, patient history, and epidemiological information. The expected result is Negative.  Fact Sheet for Patients:  PinkCheek.be  Fact Sheet for Healthcare Providers:  GravelBags.it  This test is no t yet approved or cleared by the [place] FDA and  has been authorized for detection and/or diagnosis of SARS-CoV-2 by FDA under an Emergency Use Authorization (EUA). This EUA will remain  in effect (meaning this test can be used) for the duration of the COVID-19 declaration under Section 564(b)(1) of the Act, 21 U.S.C. section 360bbb-3(b)(1), unless the authorization is terminated or revoked sooner.     Influenza A by PCR NEGATIVE NEGATIVE Final   Influenza B by PCR NEGATIVE NEGATIVE Final    Comment: (NOTE) The Xpert Xpress SARS-CoV-2/FLU/RSV assay is intended as an aid in  the diagnosis of influenza from  Nasopharyngeal swab specimens and  should not be used as a sole basis for treatment. Nasal washings and  aspirates are unacceptable for Xpert Xpress SARS-CoV-2/FLU/RSV  testing.  Fact Sheet for Patients: PinkCheek.be  Fact Sheet for Healthcare Providers: GravelBags.it  This test is not yet approved or cleared by the [place] FDA and  has been authorized for detection and/or diagnosis of SARS-CoV-2 by  FDA under an Emergency Use Authorization (EUA). This EUA will remain  in effect (meaning this test can be used) for the duration of the  Covid-19 declaration under Section 564(b)(1) of the Act, 21  U.S.C. section 360bbb-3(b)(1), unless the authorization is  terminated or revoked. Performed at  [facility], [place] [address], [place], [place] [address]   MRSA PCR Screening     Status: None   Collection Time: 11/04/19  9:10 PM   Specimen: Nasal Mucosa; Nasopharyngeal  Result Value Ref Range Status   MRSA by PCR NEGATIVE NEGATIVE Final    Comment:        The GeneXpert MRSA Assay (FDA approved for NASAL specimens only), is one component of a comprehensive MRSA colonization surveillance program. It is not intended to diagnose MRSA infection nor to guide or monitor treatment for MRSA infections. Performed at [facility], [place] [address] [place], [place] [address]          Radiology Studies: No results found.      Scheduled Meds: . apixaban  10 mg Oral BID   Followed by  . [START ON 11/13/2019] apixaban  5 mg Oral BID  . atorvastatin  40 mg Oral QHS  . budesonide (PULMICORT) nebulizer solution  0.5 mg Nebulization BID  . ipratropium-albuterol  3 mL Nebulization Q6H  . lamoTRIgine  200 mg Oral QHS  . lisinopril  40 mg Oral Daily  . loratadine  10 mg Oral QPM  . mouth rinse  15 mL Mouth Rinse BID  . methylphenidate  72 mg Oral Daily  . montelukast  10 mg Oral QHS  .  pantoprazole  40 mg Oral Daily  . predniSONE  40 mg Oral Q breakfast  . QUEtiapine  150 mg Oral QHS  . risperiDONE  3 mg Oral QHS  . sodium chloride flush  3 mL Intravenous Q12H  . venlafaxine XR  150 mg Oral QHS  . vitamin B-12  500 mcg Oral Daily   Continuous Infusions: . sodium chloride       LOS: 3 days    Time spent: 35 mins.   Bonnell Public, MD [facility] P10/29/2021, 5:11 PM

## 2019-11-07 NOTE — Clinical Social Work Note (Addendum)
30 Day Passar Note  RE: Julia Daniels        Date of Birth: [birth year]   Date:11/10/2019        To Whom It May Concern:  Please be advised that the above-named patient will require a short-term nursing home stay - anticipated 30 days or less for rehabilitation and strengthening.  The plan is for return home.   Evette Cristal, MSW, Marlinda Mike [phone]

## 2019-11-08 DIAGNOSIS — J441 Chronic obstructive pulmonary disease with (acute) exacerbation: Principal | ICD-10-CM

## 2019-11-08 NOTE — Progress Notes (Signed)
PROGRESS NOTE    Julia Daniels  BWG:[id number] DOB: 1950-09-22 DOA: 11/04/2019 PCP: Simona Huh, NP   Chief Complain: Shortness of breath  Brief Narrative:  Patient is a 69 year old female with history of diastolic congestive heart failure, COPD, asthma who presented to the emergency department from [place] with complaints of shortness of breath.She is on 4 L of oxygen at baseline.  Shortness of breath started about a day ago before admision date and the inhalers did not help.  She was also reporting some lower extremity edema.  She was admitted to rehab on 9/4 due to ambulation problem, back pain/C6 compression fracture.  She follows with Dr. Melvyn Novas for her lung disease.  On presentation, she was hypotensive, tachycardic.  She was hypoxic requiring high flow oxygen.  CT angiogram showed nonocclusive PE on the left upper lobe.  Patient was admitted for the management of acute on chronic hypoxic respiratory failure from PE, COPD exacerbation.  11/07/2019: Above documentation was reviewed.  Patient seen.  Patient continues to report intermittent shortness of breath.  Blood pressure is uncontrolled.  Will increase the dose of lisinopril.  Will change oral prednisone to IV Solu-Medrol.  Will change as needed albuterol inhaler to DuoNebs as needed.  Further management depend on hospital course.  11/08/2019: Patient is better today.  Air entry is improved.  Continue current management.  Assessment & Plan:   Active Problems:   Respiratory failure with hypoxia (HCC)   Acute on chronic hypoxic respiratory failure: Required high flow oxygen on presentation.  Also wheezing on presentation.  CT angiogram showed PE.  Acute respiratory failure due to combination of PE and COPD exacerbation.  Currently says she is on 3 L of oxygen per minute which is her baseline.  Respiratory status has been improving significantly. 11/08/2019: Suspect respiratory failure secondary to COPD with  exacerbation.  Seems to be improving.  Likely discharge in 2 days.  Acute PE: CT angio showing nonocclusive pulmonary embolus within a segmental branch pulmonary artery of the left upper lobe with involvement of 2 subsegmental branches. No central pulmonary emboli. No evidence of right heart strain. She was started on Lovenox on admission.  Will change to Eliquis.  Since this is an unprovoked episode, most likely she will need to be on anticoagulation lifelong.  We will stop aspirin that she was taking at home. Echo done on this admission showed EF of 50 to 55%, no wall motion abnormality, grade 1 diastolic dysfunction. 11/08/2019: Continue Eliquis.  Asthma/COPD exacerbation: Continue supplemental oxygen, bronchodilators,.  Started on Solu-Medrol, DuoNeb.  Follows with pulmonology,Dr Melvyn Novas, as an outpatient.  As per patient, she is having frequent exacerbation.  She needs to be discharged on tapering dose of prednisone and should be continued on  10 mg of prednisone before she is seen by her pulmonologist. 11/08/2019: Continue IV Solu-Medrol, nebs DuoNeb and Pulmicort.  Diastolic congestive heart failure/bilateral lower extremity edema: Has history of diastolic congestive heart failure.  Echo as above .  Started on 40 mg of Lasix daily( takes 20 mg at home).  Leg edema has significantly improved .BNP not significantly elevated. 11/07/2019: Consider beta-blocker when respiratory symptoms permit.  Hyperlipidemia: Continue Lipitor  Back pain/vertebral compression fracture: CT also showed interval progression of severe compression fracture of the T5 vertebral body.  She might need to follow-up with IR for kyphoplasty after resolution of her respiratory issues.  She can do that as an outpatient..  Currently she is residing at skilled nursing facility.  PT/OT consulted  Hypertension: BP stable. Continue current medications.  Monitor blood pressure. 11/07/2019: Blood pressure is not controlled.  Increase  lisinopril to 40 Mg p.o. once daily.  Continue to monitor blood pressure closely.  GERD: Continue Protonix  Vitamin B12 deficiency: Started on supplementation.  Leukocytosis: Mild.  Low suspicion for infectious etiology.  Most likely reactive.  Continue to monitor  History of anxiety/bipolar disorder: On lamotrigine, quetiapine, risperidone, Effexor  Debility/deconditioning: Currently at .  TOC consulted and following.  Plan is to discharge back to the nursing facility.  PT/OT consulted.         DVT prophylaxis:Eliquis Code Status: Full Family Communication: None at bed side.  Patient said no need to call anybody. Status is: Inpatient  Remains inpatient appropriate because:IV treatments appropriate due to intensity of illness or inability to take PO   Dispo: The patient is from: SNF              Anticipated d/c is to:SNF              Anticipated d/c date is: 1-2 day              Patient currently is not medically stable to d/c. Patient feels that she is not ready for discharge.  We will move her out of ICU today.  He still have some wheezes.  Discharge planning in 1 to 2 days.    Consultants: None  Procedures:None  Antimicrobials:  Anti-infectives (From admission, onward)   None      Subjective: Shortness of breath is improving.    Objective: Vitals:   11/08/19 0813 11/08/19 0947 11/08/19 1406 11/08/19 1446  BP:  (!) 155/70  (!) 149/82  Pulse:    (!) 103  Resp:    18  Temp:    97.9 F (36.6 C)  TempSrc:    Oral  SpO2: 96%  97% 95%  Weight:      Height:        Intake/Output Summary (Last 24 hours) at 11/08/2019 1610 Last data filed at 11/08/2019 0900 Gross per 24 hour  Intake 238 ml  Output 2050 ml  Net -1812 ml   Filed Weights   11/05/19 0630 11/06/19 0600 11/07/19 0503  Weight: 91.9 kg 93.5 kg 93.8 kg    Examination:  General exam: Morbidly obese.   Respiratory system: Improved air entry. cardiovascular system: S1 &  S2 heard. Gastrointestinal system: Abdomen is morbidly obese, soft and nontender.  Organs are difficult to assess.   Central nervous system: Alert and oriented.  Patient moves all extremities.   Data Reviewed: I have personally reviewed following labs and imaging studies  CBC: Recent Labs  Lab 11/04/19 0736 11/05/19 0251 11/06/19 0242  WBC 12.4* 14.1* 11.8*  NEUTROABS 9.8* 12.3* 10.3*  HGB 11.9* 11.7* 11.0*  HCT 39.5 38.3 36.4  MCV 105.9* 104.9* 105.8*  PLT 314 306 818   Basic Metabolic Panel: Recent Labs  Lab 11/04/19 0736 11/05/19 0251 11/06/19 0242  NA 144 140 140  K 3.9 4.7 4.7  CL 101 99 96*  CO2 33* 35* 37*  GLUCOSE 114* 132* 132*  BUN 12 13 16   CREATININE 0.66 0.57 0.53  CALCIUM 9.8 9.9 9.9  MG  --  2.4  --   PHOS  --  4.0  --    GFR: Estimated Creatinine Clearance: 74.3 mL/min (by C-G formula based on SCr of 0.53 mg/dL). Liver Function Tests: Recent Labs  Lab 11/04/19 0736 11/05/19 0251  AST 11* 10*  ALT 14 13  ALKPHOS 61 59  BILITOT 0.5 0.4  PROT 6.2* 5.9*  ALBUMIN 3.8 3.7   No results for input(s): LIPASE, AMYLASE in the last 168 hours. No results for input(s): AMMONIA in the last 168 hours. Coagulation Profile: No results for input(s): INR, PROTIME in the last 168 hours. Cardiac Enzymes: No results for input(s): CKTOTAL, CKMB, CKMBINDEX, TROPONINI in the last 168 hours. BNP (last 3 results) No results for input(s): PROBNP in the last 8760 hours. HbA1C: No results for input(s): HGBA1C in the last 72 hours. CBG: No results for input(s): GLUCAP in the last 168 hours. Lipid Profile: No results for input(s): CHOL, HDL, LDLCALC, TRIG, CHOLHDL, LDLDIRECT in the last 72 hours. Thyroid Function Tests: No results for input(s): TSH, T4TOTAL, FREET4, T3FREE, THYROIDAB in the last 72 hours. Anemia Panel: No results for input(s): VITAMINB12, FOLATE, FERRITIN, TIBC, IRON, RETICCTPCT in the last 72 hours. Sepsis Labs: No results for input(s):  PROCALCITON, LATICACIDVEN in the last 168 hours.  Recent Results (from the past 240 hour(s))  Respiratory Panel by RT PCR (Flu A&B, Covid) - Nasopharyngeal Swab     Status: None   Collection Time: 11/04/19  8:52 AM   Specimen: Nasopharyngeal Swab  Result Value Ref Range Status   SARS Coronavirus 2 by RT PCR NEGATIVE NEGATIVE Final    Comment: (NOTE) SARS-CoV-2 target nucleic acids are NOT DETECTED.  The SARS-CoV-2 RNA is generally detectable in upper respiratoy specimens during the acute phase of infection. The lowest concentration of SARS-CoV-2 viral copies this assay can detect is 131 copies/mL. A negative result does not preclude SARS-Cov-2 infection and should not be used as the sole basis for treatment or other patient management decisions. A negative result may occur with  improper specimen collection/handling, submission of specimen other than nasopharyngeal swab, presence of viral mutation(s) within the areas targeted by this assay, and inadequate number of viral copies (<131 copies/mL). A negative result must be combined with clinical observations, patient history, and epidemiological information. The expected result is Negative.  Fact Sheet for Patients:  PinkCheek.be  Fact Sheet for Healthcare Providers:  GravelBags.it  This test is no t yet approved or cleared by the [place] FDA and  has been authorized for detection and/or diagnosis of SARS-CoV-2 by FDA under an Emergency Use Authorization (EUA). This EUA will remain  in effect (meaning this test can be used) for the duration of the COVID-19 declaration under Section 564(b)(1) of the Act, 21 U.S.C. section 360bbb-3(b)(1), unless the authorization is terminated or revoked sooner.     Influenza A by PCR NEGATIVE NEGATIVE Final   Influenza B by PCR NEGATIVE NEGATIVE Final    Comment: (NOTE) The Xpert Xpress SARS-CoV-2/FLU/RSV assay is intended as an aid  in  the diagnosis of influenza from Nasopharyngeal swab specimens and  should not be used as a sole basis for treatment. Nasal washings and  aspirates are unacceptable for Xpert Xpress SARS-CoV-2/FLU/RSV  testing.  Fact Sheet for Patients: PinkCheek.be  Fact Sheet for Healthcare Providers: GravelBags.it  This test is not yet approved or cleared by the [place] FDA and  has been authorized for detection and/or diagnosis of SARS-CoV-2 by  FDA under an Emergency Use Authorization (EUA). This EUA will remain  in effect (meaning this test can be used) for the duration of the  Covid-19 declaration under Section 564(b)(1) of the Act, 21  U.S.C. section 360bbb-3(b)(1), unless the authorization is  terminated or revoked. Performed at  [facility], [place] [address] [place], [place] [address]   MRSA PCR Screening     Status: None   Collection Time: 11/04/19  9:10 PM   Specimen: Nasal Mucosa; Nasopharyngeal  Result Value Ref Range Status   MRSA by PCR NEGATIVE NEGATIVE Final    Comment:        The GeneXpert MRSA Assay (FDA approved for NASAL specimens only), is one component of a comprehensive MRSA colonization surveillance program. It is not intended to diagnose MRSA infection nor to guide or monitor treatment for MRSA infections. Performed at [facility] [place] [address], [place], [place] [address]          Radiology Studies: No results found.      Scheduled Meds: . apixaban  10 mg Oral BID   Followed by  . [START ON 11/13/2019] apixaban  5 mg Oral BID  . atorvastatin  40 mg Oral QHS  . budesonide (PULMICORT) nebulizer solution  0.5 mg Nebulization BID  . ipratropium-albuterol  3 mL Nebulization Q6H  . lamoTRIgine  200 mg Oral QHS  . lisinopril  40 mg Oral Daily  . loratadine  10 mg Oral QPM  . mouth rinse  15 mL Mouth Rinse BID  . methylphenidate  72 mg Oral Daily   . methylPREDNISolone (SOLU-MEDROL) injection  40 mg Intravenous Q6H  . montelukast  10 mg Oral QHS  . pantoprazole  40 mg Oral Daily  . QUEtiapine  150 mg Oral QHS  . risperiDONE  3 mg Oral QHS  . sodium chloride flush  3 mL Intravenous Q12H  . venlafaxine XR  150 mg Oral QHS  . vitamin B-12  500 mcg Oral Daily   Continuous Infusions: . sodium chloride       LOS: 4 days    Time spent: 25 mins.   Bonnell Public, MD [facility] P10/30/2021, 4:10 PM

## 2019-11-09 MED ORDER — PREDNISONE 50 MG PO TABS: 60.0000 mg | ORAL_TABLET | Freq: Every day | ORAL | Status: DC

## 2019-11-09 MED ORDER — PREDNISONE 50 MG PO TABS
60.0000 mg | ORAL_TABLET | Freq: Every day | ORAL | Status: DC
  Administered 2019-11-09 – 2019-11-10 (×2): 60 mg via ORAL
  Filled 2019-11-09 (×2): qty 1

## 2019-11-09 NOTE — Progress Notes (Signed)
PROGRESS NOTE    Julia Daniels  BJS:[id number] DOB: Oct 29, 1950 DOA: 11/04/2019 PCP: Simona Huh, NP   Chief Complain: Shortness of breath  Brief Narrative:  Patient is a 69 year old female with history of diastolic congestive heart failure, COPD, asthma who presented to the emergency department from [place] with complaints of shortness of breath.She is on 4 L of oxygen at baseline.  Shortness of breath started about a day ago before admision date and the inhalers did not help.  She was also reporting some lower extremity edema.  She was admitted to rehab on 9/4 due to ambulation problem, back pain/C6 compression fracture.  She follows with Dr. Melvyn Novas for her lung disease.  On presentation, she was hypotensive, tachycardic.  She was hypoxic requiring high flow oxygen.  CT angiogram showed nonocclusive PE on the left upper lobe.  Patient was admitted for the management of acute on chronic hypoxic respiratory failure from PE, COPD exacerbation.  11/07/2019: Above documentation was reviewed.  Patient seen.  Patient continues to report intermittent shortness of breath.  Blood pressure is uncontrolled.  Will increase the dose of lisinopril.  Will change oral prednisone to IV Solu-Medrol.  Will change as needed albuterol inhaler to DuoNebs as needed.  Further management depend on hospital course.  11/08/2019: Patient is better today.  Air entry is improved.  Continue current management.  11/09/2019: Patient seen.  Patient continues to improve.  Will change IV Solu-Medrol to oral prednisone.  Likely discharge tomorrow back to skilled nursing facility.  Tapering dose of prednisone on discharge.  Assessment & Plan:   Active Problems:   Respiratory failure with hypoxia (HCC)   Acute on chronic hypoxic respiratory failure:  -Required high flow oxygen on presentation.  Also wheezing on presentation.  CT angiogram showed PE.  -Acute respiratory failure due to combination of PE  and COPD exacerbation.   -Respiratory status has been improving significantly with current management. -Discontinue IV Solu-Medrol.  Start prednisone 60 Mg p.o. once daily. -Likely discharge tomorrow on tapering dose of oral prednisone.    Acute PE:  -CT angio showing nonocclusive pulmonary embolus within a segmental branch pulmonary artery of the left upper lobe with involvement of 2 subsegmental branches.  -No central pulmonary emboli. No evidence of right heart strain. She was started on Lovenox on admission.  -Continue Eliquis.   -PE seems unprovoked.   -Likely lifelong anticoagulation.  Aspirin is on hold.   -Echo done on this admission showed EF of 50 to 55%, no wall motion abnormality, grade 1 diastolic dysfunction.  Asthma/COPD exacerbation:  -See above.  -Change IV Solumedrol to Prednisone -Taper Prednisone on discharge -Continue Neb treatment -Follow up with Pulmonologist on discharge. t.  Diastolic congestive heart failure/bilateral lower extremity edema:  -History of history of diastolic congestive heart failure.  Echo as above .   -Stable.  Hyperlipidemia: Continue Lipitor  Back pain/vertebral compression fracture: CT also showed interval progression of severe compression fracture of the T5 vertebral body.  She might need to follow-up with IR for kyphoplasty after resolution of her respiratory issues.  She can do that as an outpatient..  Currently she is residing at skilled nursing facility.  PT/OT consulted  Hypertension: BP stable. Continue current medications.  Monitor blood pressure. 11/07/2019: Blood pressure is not controlled.  Increase lisinopril to 40 Mg p.o. once daily.  Continue to monitor blood pressure closely.  GERD: Continue Protonix  Vitamin B12 deficiency: Started on supplementation.  Leukocytosis:  -Slowly improving.  History of  anxiety/bipolar disorder: On lamotrigine, quetiapine, risperidone, Effexor  Debility/deconditioning: Currently at  [place].  TOC consulted and following.  Plan is to discharge back to the nursing facility.  PT/OT consulted.  DVT prophylaxis:Eliquis Code Status: Full Family Communication:  Status: Inpatient  Remains inpatient appropriate because:IV treatments appropriate due to intensity of illness or inability to take PO   Dispo: The patient is from: SNF              Anticipated d/c is to:SNF              Anticipated d/c date is: 1-2 day              Patient currently is not medically stable to d/c. Patient feels that she is not ready for discharge.  We will move her out of ICU today.  He still have some wheezes.  Discharge planning in 1 to 2 days.    Consultants: None  Procedures:None  Antimicrobials:  Anti-infectives (From admission, onward)   None      Subjective: Shortness of breath is improving.    Objective: Vitals:   11/09/19 0047 11/09/19 0539 11/09/19 0545 11/09/19 1344  BP:  (!) 143/85  (!) 158/82  Pulse:  88  (!) 108  Resp:  18  18  Temp:  98.2 F (36.8 C)  98.4 F (36.9 C)  TempSrc:  Oral    SpO2: 96% 95% 93% 98%  Weight:      Height:        Intake/Output Summary (Last 24 hours) at 11/09/2019 1449 Last data filed at 11/09/2019 1120 Gross per 24 hour  Intake --  Output 2800 ml  Net -2800 ml   Filed Weights   11/05/19 0630 11/06/19 0600 11/07/19 0503  Weight: 91.9 kg 93.5 kg 93.8 kg    Examination:  General exam: Morbidly obese.   Respiratory system: Improved air entry. cardiovascular system: S1 & S2 heard. Gastrointestinal system: Abdomen is morbidly obese, soft and nontender.  Organs are difficult to assess.   Central nervous system: Alert and oriented.  Patient moves all extremities.   Data Reviewed: I have personally reviewed following labs and imaging studies  CBC: Recent Labs  Lab 11/04/19 0736 11/05/19 0251 11/06/19 0242  WBC 12.4* 14.1* 11.8*  NEUTROABS 9.8* 12.3* 10.3*  HGB 11.9* 11.7* 11.0*  HCT 39.5 38.3 36.4  MCV  105.9* 104.9* 105.8*  PLT 314 306 741   Basic Metabolic Panel: Recent Labs  Lab 11/04/19 0736 11/05/19 0251 11/06/19 0242  NA 144 140 140  K 3.9 4.7 4.7  CL 101 99 96*  CO2 33* 35* 37*  GLUCOSE 114* 132* 132*  BUN 12 13 16   CREATININE 0.66 0.57 0.53  CALCIUM 9.8 9.9 9.9  MG  --  2.4  --   PHOS  --  4.0  --    GFR: Estimated Creatinine Clearance: 74.3 mL/min (by C-G formula based on SCr of 0.53 mg/dL). Liver Function Tests: Recent Labs  Lab 11/04/19 0736 11/05/19 0251  AST 11* 10*  ALT 14 13  ALKPHOS 61 59  BILITOT 0.5 0.4  PROT 6.2* 5.9*  ALBUMIN 3.8 3.7   No results for input(s): LIPASE, AMYLASE in the last 168 hours. No results for input(s): AMMONIA in the last 168 hours. Coagulation Profile: No results for input(s): INR, PROTIME in the last 168 hours. Cardiac Enzymes: No results for input(s): CKTOTAL, CKMB, CKMBINDEX, TROPONINI in the last 168 hours. BNP (last 3 results) No results for input(s):  PROBNP in the last 8760 hours. HbA1C: No results for input(s): HGBA1C in the last 72 hours. CBG: No results for input(s): GLUCAP in the last 168 hours. Lipid Profile: No results for input(s): CHOL, HDL, LDLCALC, TRIG, CHOLHDL, LDLDIRECT in the last 72 hours. Thyroid Function Tests: No results for input(s): TSH, T4TOTAL, FREET4, T3FREE, THYROIDAB in the last 72 hours. Anemia Panel: No results for input(s): VITAMINB12, FOLATE, FERRITIN, TIBC, IRON, RETICCTPCT in the last 72 hours. Sepsis Labs: No results for input(s): PROCALCITON, LATICACIDVEN in the last 168 hours.  Recent Results (from the past 240 hour(s))  Respiratory Panel by RT PCR (Flu A&B, Covid) - Nasopharyngeal Swab     Status: None   Collection Time: 11/04/19  8:52 AM   Specimen: Nasopharyngeal Swab  Result Value Ref Range Status   SARS Coronavirus 2 by RT PCR NEGATIVE NEGATIVE Final    Comment: (NOTE) SARS-CoV-2 target nucleic acids are NOT DETECTED.  The SARS-CoV-2 RNA is generally detectable in  upper respiratoy specimens during the acute phase of infection. The lowest concentration of SARS-CoV-2 viral copies this assay can detect is 131 copies/mL. A negative result does not preclude SARS-Cov-2 infection and should not be used as the sole basis for treatment or other patient management decisions. A negative result may occur with  improper specimen collection/handling, submission of specimen other than nasopharyngeal swab, presence of viral mutation(s) within the areas targeted by this assay, and inadequate number of viral copies (<131 copies/mL). A negative result must be combined with clinical observations, patient history, and epidemiological information. The expected result is Negative.  Fact Sheet for Patients:  PinkCheek.be  Fact Sheet for Healthcare Providers:  GravelBags.it  This test is no t yet approved or cleared by the [place] FDA and  has been authorized for detection and/or diagnosis of SARS-CoV-2 by FDA under an Emergency Use Authorization (EUA). This EUA will remain  in effect (meaning this test can be used) for the duration of the COVID-19 declaration under Section 564(b)(1) of the Act, 21 U.S.C. section 360bbb-3(b)(1), unless the authorization is terminated or revoked sooner.     Influenza A by PCR NEGATIVE NEGATIVE Final   Influenza B by PCR NEGATIVE NEGATIVE Final    Comment: (NOTE) The Xpert Xpress SARS-CoV-2/FLU/RSV assay is intended as an aid in  the diagnosis of influenza from Nasopharyngeal swab specimens and  should not be used as a sole basis for treatment. Nasal washings and  aspirates are unacceptable for Xpert Xpress SARS-CoV-2/FLU/RSV  testing.  Fact Sheet for Patients: PinkCheek.be  Fact Sheet for Healthcare Providers: GravelBags.it  This test is not yet approved or cleared by the [place] FDA and  has been  authorized for detection and/or diagnosis of SARS-CoV-2 by  FDA under an Emergency Use Authorization (EUA). This EUA will remain  in effect (meaning this test can be used) for the duration of the  Covid-19 declaration under Section 564(b)(1) of the Act, 21  U.S.C. section 360bbb-3(b)(1), unless the authorization is  terminated or revoked. Performed at [facility], [place] [address], [place], [place] [address]   MRSA PCR Screening     Status: None   Collection Time: 11/04/19  9:10 PM   Specimen: Nasal Mucosa; Nasopharyngeal  Result Value Ref Range Status   MRSA by PCR NEGATIVE NEGATIVE Final    Comment:        The GeneXpert MRSA Assay (FDA approved for NASAL specimens only), is one component of a comprehensive MRSA colonization surveillance program. It  is not intended to diagnose MRSA infection nor to guide or monitor treatment for MRSA infections. Performed at [facility], [place] [address] [place], [place] [address]          Radiology Studies: No results found.      Scheduled Meds: . apixaban  10 mg Oral BID   Followed by  . [START ON 11/13/2019] apixaban  5 mg Oral BID  . atorvastatin  40 mg Oral QHS  . budesonide (PULMICORT) nebulizer solution  0.5 mg Nebulization BID  . ipratropium-albuterol  3 mL Nebulization Q6H  . lamoTRIgine  200 mg Oral QHS  . lisinopril  40 mg Oral Daily  . loratadine  10 mg Oral QPM  . mouth rinse  15 mL Mouth Rinse BID  . methylphenidate  72 mg Oral Daily  . montelukast  10 mg Oral QHS  . pantoprazole  40 mg Oral Daily  . predniSONE  60 mg Oral Daily  . QUEtiapine  150 mg Oral QHS  . risperiDONE  3 mg Oral QHS  . sodium chloride flush  3 mL Intravenous Q12H  . venlafaxine XR  150 mg Oral QHS  . vitamin B-12  500 mcg Oral Daily   Continuous Infusions: . sodium chloride       LOS: 5 days    Time spent: 25 mins.   Bonnell Public, MD [facility] P10/31/2021, 2:49 PM

## 2019-11-09 NOTE — Discharge Instructions (Signed)
Information on my medicine - ELIQUIS (apixaban)  Why was Eliquis prescribed for you? Eliquis was prescribed to treat blood clots that may have been found in the veins of your legs (deep vein thrombosis) or in your lungs (pulmonary embolism) and to reduce the risk of them occurring again.  What do You need to know about Eliquis ? The starting dose is 10 mg (two 5 mg tablets) taken TWICE daily for the FIRST SEVEN (7) DAYS, then on 11/13/2019  the dose is reduced to ONE 5 mg tablet taken TWICE daily.  Eliquis may be taken with or without food.   Try to take the dose about the same time in the morning and in the evening. If you have difficulty swallowing the tablet whole please discuss with your pharmacist how to take the medication safely.  Take Eliquis exactly as prescribed and DO NOT stop taking Eliquis without talking to the doctor who prescribed the medication.  Stopping may increase your risk of developing a new blood clot.  Refill your prescription before you run out.  After discharge, you should have regular check-up appointments with your healthcare provider that is prescribing your Eliquis.    What do you do if you miss a dose? If a dose of ELIQUIS is not taken at the scheduled time, take it as soon as possible on the same day and twice-daily administration should be resumed. The dose should not be doubled to make up for a missed dose.  Important Safety Information A possible side effect of Eliquis is bleeding. You should call your healthcare provider right away if you experience any of the following: ? Bleeding from an injury or your nose that does not stop. ? Unusual colored urine (red or dark brown) or unusual colored stools (red or black). ? Unusual bruising for unknown reasons. ? A serious fall or if you hit your head (even if there is no bleeding).  Some medicines may interact with Eliquis and might increase your risk of bleeding or clotting while on Eliquis. To help  avoid this, consult your healthcare provider or pharmacist prior to using any new prescription or non-prescription medications, including herbals, vitamins, non-steroidal anti-inflammatory drugs (NSAIDs) and supplements.  This website has more information on Eliquis (apixaban): http://www.eliquis.com/eliquis/home

## 2019-11-09 NOTE — Progress Notes (Signed)
ANTICOAGULATION CONSULT NOTE   Pharmacy Consult forApixaban Indication: pulmonary embolus  Allergies  Allergen Reactions  . Sulfa Antibiotics Other (See Comments)    Mouth gets raw   Patient Measurements: Height: 5\' 3"  (160 cm) Weight: 93.8 kg (206 lb 12.7 oz) IBW/kg (Calculated) : 52.4 Heparin Dosing Weight: 72kg  Vital Signs: Temp: 98.2 F (36.8 C) (10/31 0539) Temp Source: Oral (10/31 0539) BP: 143/85 (10/31 0539) Pulse Rate: 88 (10/31 0539)  Labs: No results for input(s): HGB, HCT, PLT, APTT, LABPROT, INR, HEPARINUNFRC, HEPRLOWMOCWT, CREATININE, CKTOTAL, CKMB, TROPONINIHS in the last 72 hours. Estimated Creatinine Clearance: 74.3 mL/min (by C-G formula based on SCr of 0.53 mg/dL).  Medical History: Past Medical History:  Diagnosis Date  . Anxiety   . Asthma   . COPD (chronic obstructive pulmonary disease) ([place])   . Depression    Medications:  Scheduled:  . apixaban  10 mg Oral BID   Followed by  . [START ON 11/13/2019] apixaban  5 mg Oral BID  . atorvastatin  40 mg Oral QHS  . budesonide (PULMICORT) nebulizer solution  0.5 mg Nebulization BID  . ipratropium-albuterol  3 mL Nebulization Q6H  . lamoTRIgine  200 mg Oral QHS  . lisinopril  40 mg Oral Daily  . loratadine  10 mg Oral QPM  . mouth rinse  15 mL Mouth Rinse BID  . methylphenidate  72 mg Oral Daily  . methylPREDNISolone (SOLU-MEDROL) injection  40 mg Intravenous Q6H  . montelukast  10 mg Oral QHS  . pantoprazole  40 mg Oral Daily  . QUEtiapine  150 mg Oral QHS  . risperiDONE  3 mg Oral QHS  . sodium chloride flush  3 mL Intravenous Q12H  . venlafaxine XR  150 mg Oral QHS  . vitamin B-12  500 mcg Oral Daily   Assessment: 51 yoF to ED with ShOB and LE edema. Non-occlusive PE found on CTA, no right heart strain. Initially on Lovenox per pharmacy, changed to apixaban.  Requires 4L O2 at baseline, now on 3L.  No new labs.  Patient eduction completed.  Goal of Therapy:  Monitor platelets by  anticoagulation protocol: Yes   Plan:  Continue Apixaban 10mg  bid x 7 days, followed by 5mg  bid Pharmacy will sign off notes, but will follow up peripherally.  Gretta Arab PharmD, BCPS Clinical Pharmacist [facility] main pharmacy [phone] 11/09/2019 1:10 PM

## 2019-11-10 LAB — GLUCOSE, CAPILLARY: Glucose-Capillary: 108 mg/dL — ABNORMAL HIGH (ref 70–99)

## 2019-11-10 LAB — SARS CORONAVIRUS 2 BY RT PCR (HOSPITAL ORDER, PERFORMED IN ~~LOC~~ HOSPITAL LAB): SARS Coronavirus 2: NEGATIVE

## 2019-11-10 MED ORDER — PREDNISONE 10 MG PO TABS: ORAL_TABLET | ORAL | 0 refills | Status: DC

## 2019-11-10 MED ORDER — HYDROCODONE-ACETAMINOPHEN 5-325 MG PO TABS: 1.0000 | ORAL_TABLET | Freq: Three times a day (TID) | ORAL | 0 refills | Status: AC

## 2019-11-10 MED ORDER — QUETIAPINE FUMARATE 100 MG PO TABS
150.0000 mg | ORAL_TABLET | Freq: Every day | ORAL | 0 refills | Status: DC
Start: 2019-11-10 — End: 2020-04-01

## 2019-11-10 MED ORDER — METHYLPHENIDATE HCL ER (OSM) 72 MG PO TBCR: 72.0000 mg | EXTENDED_RELEASE_TABLET | Freq: Every day | ORAL | 0 refills | Status: DC

## 2019-11-10 MED ORDER — BARRIER CREAM NON-SPECIFIED: 1.0000 "application " | TOPICAL_CREAM | Freq: Every day | TOPICAL | 1 refills | Status: AC | PRN

## 2019-11-10 MED ORDER — RISPERIDONE 3 MG PO TABS
3.0000 mg | ORAL_TABLET | Freq: Every day | ORAL | 0 refills | Status: DC
Start: 2019-11-10 — End: 2020-03-22

## 2019-11-10 MED ORDER — SALINE SPRAY 0.65 % NA SOLN: 1.0000 | NASAL | 0 refills | Status: DC | PRN

## 2019-11-10 MED ORDER — LISINOPRIL 40 MG PO TABS
40.0000 mg | ORAL_TABLET | Freq: Every day | ORAL | 0 refills | Status: DC
Start: 2019-11-11 — End: 2019-11-20

## 2019-11-10 MED ORDER — APIXABAN 5 MG PO TABS
ORAL_TABLET | ORAL | 0 refills | Status: DC
Start: 2019-11-10 — End: 2019-12-12

## 2019-11-10 MED ORDER — CYANOCOBALAMIN 500 MCG PO TABS: 500.0000 ug | ORAL_TABLET | Freq: Every day | ORAL | 0 refills | Status: AC

## 2019-11-10 MED ORDER — ALPRAZOLAM 0.5 MG PO TABS: 0.5000 mg | ORAL_TABLET | Freq: Three times a day (TID) | ORAL | 0 refills | Status: DC | PRN

## 2019-11-10 NOTE — TOC Transition Note (Signed)
Transition of Care [facility]) - CM/SW Discharge Note   Patient Details  Name: SCHERRIE SENECA MRN: [id number] Date of Birth: [birth year]  [facility]) CM/SW Contact:  Ross Ludwig, LCSW Phone Number: 11/10/2019, 3:11 PM   Clinical Narrative:     Patient to be d/c'ed today to [facility].  Patient and family agreeable to plans will transport via ems RN to call report to [phone].  Final next level of care: Skilled Nursing Facility Barriers to Discharge: Barriers Resolved   Patient Goals and CMS Choice Patient states their goals for this hospitalization and ongoing recovery are:: To continue with her therapy and then return back home. CMS Medicare.gov Compare Post Acute Care list provided to:: Patient Choice offered to / list presented to : Patient  Discharge Placement    Patient to discharge to SNF.            Patient [facility] bed at: [facility] Patient to be transferred to facility by: [facility] Name of family member notified: Patient to notify her family member Patient and family notified of of transfer: 11/10/19  Discharge Plan and Services   Discharge Planning Services: CM Consult Post Acute Care Choice: [place]            DME Agency: NA       HH Arranged: NA          Social Determinants of Health (SDOH) Interventions     Readmission Risk Interventions No flowsheet data found.

## 2019-11-10 NOTE — Discharge Summary (Signed)
Physician Discharge Summary  Patient ID: Julia Daniels MRN: [id number] DOB/AGE: 15-Jan-1950 69 y.o.  Admit date: 11/04/2019 Discharge date: 11/10/2019  Admission Diagnoses:  Discharge Diagnoses:  Active Problems: Acute on chronic respiratory failure with hypoxia (HCC) COPD with acute exacerbation. Pulmonary embolism   Discharged Condition: stable  Hospital Course:  Patient is a 69 year old female with past medical history significant for diastolic congestive heart failure, COPD on home oxygen (2-4L/min via nasal cannula), asthma and morbid obesity.  Patient presented to the emergency department from [place] with complaints of shortness.  Shortness of breath started about a day ago  prior to presentation, and the inhalers did not help.  Patient also reporting some lower extremity edema.  Patient was admitted to rehab on 9/4 due to ambulation problem, back pain/C6 compression fracture.  On presentation, patient was hypotensive, tachycardic, and hypoxic requiring high flow oxygen.  CT angiogram of the chest revealed nonocclusive PE of the left upper lobe.  Patient was admitted and managed for pulmonary embolism, acute on chronic hypoxic respiratory failure and COPD exacerbation.  Patient is currently on apixaban.  Patient was managed with IV Solu-Medrol and nebulizer treatment.  Patient symptoms have resolved.  Patient will be discharged back to the facility.  Acute on chronic hypoxic respiratory failure:  -Required high flow oxygen on presentation.  Also wheezing on presentation.  CT angiogram showed PE.  -Acute respiratory failure due to combination of PE and COPD exacerbation.   -Patient was managed with anticoagulation, IV Solu-Medrol and nebulizer treatment. -Patient has improved significantly, and is back to baseline. -Patient be discharged back to the skilled nursing facility for rehab.   -Patient will need to follow-up with a primary care provider on discharge.     Acute PE:  -CT angio showing nonocclusive pulmonary embolus within a segmental branch pulmonary artery of the left upper lobe with involvement of 2 subsegmental branches.  -No central pulmonary emboli. No evidence of right heart strain. She was started on Lovenox on admission.  -Continue Eliquis.   -PE seems unprovoked.   -Likely lifelong anticoagulation.  Aspirin is on hold.   -Echo done on this admission showed EF of 50 to 55%, no wall motion abnormality, grade 1 diastolic dysfunction.  Asthma/COPD exacerbation:  -See above.  -Manage with IV Solu-Medrol.  Patient was eventually transitioned to oral prednisone 60 mg p.o. once daily.  Patient be discharged on tapering dose of prednisone.   -Continue Neb treatment -Follow up with Pulmonologist on discharge.   Diastolic congestive heart failure/bilateral lower extremity edema:  -History of history of diastolic congestive heart failure.   -Stable.  Hyperlipidemia: Continue Lipitor  Back pain/vertebral compression fracture: CT also showed interval progression of severe compression fracture of the T5 vertebral body.  She might need to follow-up with IR for kyphoplasty after resolution of her respiratory issues.  She can do that as an outpatient..  Currently she is residing at skilled nursing facility for rehabilitation.  PT/OT consulted  Hypertension:  -BP stable.  -Primary care provider to continue optimizing patient's blood pressure.    GERD: Continue Protonix  Vitamin B12 deficiency: Started on supplementation.  History of anxiety/bipolar disorder: On lamotrigine, quetiapine, risperidone, Effexor  Debility/deconditioning: Admitted from [facility].  Plan is to discharge back to the same facility.    Consults: None  Significant Diagnostic Studies:  CTA of the chest revealed: 1. Nonocclusive pulmonary embolus within a segmental branch pulmonary artery of the left upper lobe with involvement of  2 subsegmental  branches. No central pulmonary emboli. No evidence of right heart strain. 2. Severe compression deformity of the T4 vertebral body, new from prior. 3. Interval progression of severe compression fracture of the T5 vertebral body. There is approximately 4 mm of bony retropulsion of the T5 fracture site, progressed from prior. 4. Lungs are clear. 5. Aortic atherosclerosis   Discharge Exam: Blood pressure 124/81, pulse 92, temperature 98.5 F (36.9 C), resp. rate 18, height 5\' 3"  (1.6 m), weight 91.8 kg, SpO2 93 %.   Disposition: Discharge disposition: 03-Skilled Nursing Facility   Discharge Instructions    Diet - low sodium heart healthy   Complete by: As directed    Discharge wound care:   Complete by: As directed    Continue current wound dressing regimen   Increase activity slowly   Complete by: As directed      Allergies as of 11/10/2019      Reactions   Sulfa Antibiotics Other (See Comments)   Mouth gets raw      Medication List    STOP taking these medications   furosemide 40 MG tablet Commonly known as: LASIX   ibuprofen 800 MG tablet Commonly known as: ADVIL     TAKE these medications   albuterol 108 (90 Base) MCG/ACT inhaler Commonly known as: ProAir HFA Inhale 2 puffs into the lungs every 6 (six) hours as needed for wheezing or shortness of breath.   albuterol (2.5 MG/3ML) 0.083% nebulizer solution Commonly known as: PROVENTIL Take 3 mLs (2.5 mg total) by nebulization every 6 (six) hours as needed for wheezing or shortness of breath.   ALPRAZolam 0.5 MG tablet Commonly known as: XANAX Take 1 tablet (0.5 mg total) by mouth 3 (three) times daily as needed for anxiety. What changed:   medication strength  how much to take  when to take this   apixaban 5 MG Tabs tablet Commonly known as: ELIQUIS Apixaban 10 mg po bid for 3 days, then decrease dose to 5 mg po Bid   atorvastatin 40 MG tablet Commonly known as: Lipitor Take 1  tablet (40 mg total) by mouth daily. What changed: when to take this   baclofen 10 MG tablet Commonly known as: LIORESAL Take 10 mg by mouth 3 (three) times daily.   barrier cream Crea Commonly known as: non-specified Apply 1 application topically daily as needed (apply to rash).   budesonide-formoterol 160-4.5 MCG/ACT inhaler Commonly known as: Symbicort INHALE 2 PUFFS INTO LUNGS TWICE A DAY What changed:   how much to take  how to take this  when to take this  additional instructions   Dermacloud Oint Apply 1 application topically daily as needed (rash).   famotidine 20 MG tablet Commonly known as: PEPCID One at bedtime What changed:   how much to take  how to take this  when to take this  additional instructions   guaiFENesin 600 MG 12 hr tablet Commonly known as: MUCINEX Take 1 tablet (600 mg total) by mouth 2 (two) times daily. What changed: how much to take   HYDROcodone-acetaminophen 5-325 MG tablet Commonly known as: NORCO/VICODIN Take 1 tablet by mouth in the morning, at noon, and at bedtime for 7 days.   lamoTRIgine 200 MG tablet Commonly known as: LAMICTAL Take 200 mg by mouth at bedtime.   lidocaine 5 % Commonly known as: LIDODERM Place 1 patch onto the skin daily. Remove & Discard patch within 12 hours or as directed by MD   lisinopril 40 MG tablet  Commonly known as: ZESTRIL Take 1 tablet (40 mg total) by mouth daily. Start taking on: November 11, 2019   loratadine 10 MG tablet Commonly known as: CLARITIN Take 10 mg by mouth every evening.   Methylphenidate HCl ER 72 MG Tbcr Take 72 mg by mouth daily.   montelukast 10 MG tablet Commonly known as: SINGULAIR Take 1 tablet (10 mg total) by mouth at bedtime.   pantoprazole 40 MG tablet Commonly known as: Protonix Take 1 tablet (40 mg total) by mouth daily. Take 30-60 min before first meal of the day   predniSONE 10 MG tablet Commonly known as: DELTASONE Prednisone 60 mg po daily  for 4 days, then 40 mg po daily for 4 days, then 30 mg po daily for 4 days, then 20 mg po daily for 4 days, then 10 mg po daily for 4 days and stop What changed:   medication strength  how much to take  how to take this  when to take this  additional instructions   QUEtiapine 100 MG tablet Commonly known as: SEROQUEL Take 1.5 tablets (150 mg total) by mouth at bedtime.   risperiDONE 3 MG tablet Commonly known as: RISPERDAL Take 1 tablet (3 mg total) by mouth at bedtime.   senna-docusate 8.6-50 MG tablet Commonly known as: Senokot-S Take 1 tablet by mouth 2 (two) times daily.   sodium chloride 0.65 % Soln nasal spray Commonly known as: OCEAN Place 1 spray into both nostrils as needed for congestion.   Spiriva Respimat 2.5 MCG/ACT Aers Generic drug: Tiotropium Bromide Monohydrate Inhale 2 puffs into the lungs daily.   venlafaxine XR 150 MG 24 hr capsule Commonly known as: EFFEXOR-XR Take 150 mg by mouth at bedtime.   vitamin B-12 500 MCG tablet Commonly known as: CYANOCOBALAMIN Take 1 tablet (500 mcg total) by mouth daily. Start taking on: November 11, 2019   Vitamin D (Ergocalciferol) 1.25 MG (50000 UNIT) Caps capsule Commonly known as: DRISDOL Take 50,000 Units by mouth every Saturday.            Discharge Care Instructions  (From admission, onward)         Start     Ordered   11/10/19 0000  Discharge wound care:       Comments: Continue current wound dressing regimen   11/10/19 1146           Signed: Bonnell Public 11/10/2019, 11:48 AM

## 2019-11-10 NOTE — Progress Notes (Signed)
Pt transferred by EMS from [place] to [facility].

## 2019-11-10 NOTE — Progress Notes (Signed)
Received call back from Sheran Spine, RN at [facility] for report. All questions addressed.

## 2019-11-10 NOTE — Progress Notes (Signed)
Physical Therapy Treatment Patient Details Name: Julia Daniels MRN: [id number] DOB: 1950/12/04 Today's Date: 11/10/2019    History of Present Illness Patient is a 69 year old female with history of diastolic congestive heart failure, COPD, asthma who presented to the emergency department from [place] with complaints of shortness of breath, hypertensive, hypoxic.Marland KitchenShe is on 4 L of oxygen at baseline.   presentation, she was hypotensive, tachycardic.  She was hypoxic requiring high flow oxygen.  CT angiogram showed nonocclusive PE on the left upper lobe. H/O T6compression fracture, treated with TLSO.    PT Comments    Pt was agreeable to working with PT this morning. Pt reported moderate back pain during session. O2 98% on 3L during session. Cues for pacing, rest breaks, and pursed lip breathing during session. Min assist for mobility. Mod assist for donning TLSO. Pt tends to get anxious when mobilizing. She participated well and is eager to return to SNF to continue rehab (she has been progressing well there). Recommend return to SNF.    Follow Up Recommendations  SNF     Equipment Recommendations  None recommended by PT    Recommendations for Other Services       Precautions / Restrictions Precautions Precautions: Fall Precaution Comments: O2 dep since Aug (per pt). Required Braces or Orthoses: Spinal Brace Spinal Brace: Thoracolumbosacral orthotic;Applied in sitting position Restrictions Weight Bearing Restrictions: No    Mobility  Bed Mobility Overal bed mobility: Needs Assistance Bed Mobility: Supine to Sit     Supine to sit: Supervision;HOB elevated     General bed mobility comments: moves quickly, DOE 3/4. O2 91% on RA.  Transfers Overall transfer level: Needs assistance Equipment used: Rolling walker (2 wheeled) Transfers: Sit to/from [facility] Sit to Stand: Min guard Stand pivot transfers: Min assist       General  transfer comment: Cues for safety, hand placement.  Pt moves quickly-cues for pacing. Min assist for stand pivot x 2, recliner <>bsc.  Ambulation/Gait Ambulation/Gait assistance: Min assist Gait Distance (Feet): 40 Feet Assistive device: Rolling walker (2 wheeled) Gait Pattern/deviations: Step-through pattern;Decreased stride length     General Gait Details: Assist to stabilize throughout short distance. Cues for safety, posture, RW proximity, pacing. 1 standing rest break after ~20 feet. Dyspnea 3/4. O2 98% on 3L.   Stairs             Wheelchair Mobility    Modified Rankin (Stroke Patients Only)       Balance Overall balance assessment: Needs assistance         Standing balance support: Bilateral upper extremity supported Standing balance-Leahy Scale: Fair                              Cognition Arousal/Alertness: Awake/alert Behavior During Therapy: WFL for tasks assessed/performed Overall Cognitive Status: Within Functional Limits for tasks assessed                                        Exercises      General Comments        Pertinent Vitals/Pain Pain Assessment: 0-10 Pain Score: 7  Pain Location: back Pain Descriptors / Indicators: Discomfort;Sore;Grimacing Pain Intervention(s): Limited activity within patient's tolerance;Monitored during session;Repositioned    Home Living  Prior Function            PT Goals (current goals can now be found in the care plan section) Progress towards PT goals: Progressing toward goals    Frequency    Min 2X/week      PT Plan Current plan remains appropriate    Co-evaluation              AM-PAC PT "6 Clicks" Mobility   Outcome Measure  Help needed turning from your back to your side while in a flat bed without using bedrails?: None Help needed moving from lying on your back to sitting on the side of a flat bed without using bedrails?:  None Help needed moving to and from a bed to a chair (including a wheelchair)?: A Little Help needed standing up from a chair using your arms (e.g., wheelchair or bedside chair)?: A Little Help needed to walk in hospital room?: A Little Help needed climbing 3-5 steps with a railing? : A Lot 6 Click Score: 19    End of Session Equipment Utilized During Treatment: Oxygen;Back brace;Gait belt Activity Tolerance: Patient limited by fatigue (limited by dyspnea) Patient left: in chair;with call bell/phone within reach;with chair alarm set   PT Visit Diagnosis: Difficulty in walking, not elsewhere classified (R26.2);Pain Pain - part of body:  (back)     Time: 0762-2633 PT Time Calculation (min) (ACUTE ONLY): 25 min  Charges:  $Gait Training: 23-37 mins                         Doreatha Massed, PT Acute Rehabilitation  Office: [phone] Pager: [phone]

## 2019-11-10 NOTE — Progress Notes (Signed)
RN attempted to call report with no answer. RN called facility back and Destiny, Network engineer took my name and phone number to have the nurse call me back for report.

## 2019-11-11 ENCOUNTER — Ambulatory Visit (INDEPENDENT_AMBULATORY_CARE_PROVIDER_SITE_OTHER): Payer: BC Managed Care – PPO | Admitting: Primary Care

## 2019-11-11 ENCOUNTER — Telehealth: Payer: Self-pay | Admitting: Primary Care

## 2019-11-11 ENCOUNTER — Other Ambulatory Visit: Payer: Self-pay

## 2019-11-11 ENCOUNTER — Encounter: Payer: Self-pay | Admitting: Primary Care

## 2019-11-11 DIAGNOSIS — J9621 Acute and chronic respiratory failure with hypoxia: Secondary | ICD-10-CM

## 2019-11-11 DIAGNOSIS — J9622 Acute and chronic respiratory failure with hypercapnia: Secondary | ICD-10-CM | POA: Diagnosis not present

## 2019-11-11 DIAGNOSIS — S22000D Wedge compression fracture of unspecified thoracic vertebra, subsequent encounter for fracture with routine healing: Secondary | ICD-10-CM | POA: Diagnosis not present

## 2019-11-11 DIAGNOSIS — K219 Gastro-esophageal reflux disease without esophagitis: Secondary | ICD-10-CM | POA: Diagnosis not present

## 2019-11-11 DIAGNOSIS — J45901 Unspecified asthma with (acute) exacerbation: Secondary | ICD-10-CM | POA: Diagnosis not present

## 2019-11-11 DIAGNOSIS — Z9981 Dependence on supplemental oxygen: Secondary | ICD-10-CM | POA: Diagnosis not present

## 2019-11-11 DIAGNOSIS — F419 Anxiety disorder, unspecified: Secondary | ICD-10-CM | POA: Diagnosis not present

## 2019-11-11 DIAGNOSIS — R600 Localized edema: Secondary | ICD-10-CM | POA: Diagnosis not present

## 2019-11-11 DIAGNOSIS — I5032 Chronic diastolic (congestive) heart failure: Secondary | ICD-10-CM | POA: Diagnosis not present

## 2019-11-11 DIAGNOSIS — I10 Essential (primary) hypertension: Secondary | ICD-10-CM | POA: Diagnosis not present

## 2019-11-11 DIAGNOSIS — J449 Chronic obstructive pulmonary disease, unspecified: Secondary | ICD-10-CM

## 2019-11-11 DIAGNOSIS — F319 Bipolar disorder, unspecified: Secondary | ICD-10-CM | POA: Diagnosis not present

## 2019-11-11 DIAGNOSIS — J4550 Severe persistent asthma, uncomplicated: Secondary | ICD-10-CM | POA: Diagnosis not present

## 2019-11-11 DIAGNOSIS — J441 Chronic obstructive pulmonary disease with (acute) exacerbation: Secondary | ICD-10-CM | POA: Diagnosis not present

## 2019-11-11 DIAGNOSIS — F411 Generalized anxiety disorder: Secondary | ICD-10-CM | POA: Diagnosis not present

## 2019-11-11 DIAGNOSIS — M6281 Muscle weakness (generalized): Secondary | ICD-10-CM | POA: Diagnosis not present

## 2019-11-11 DIAGNOSIS — R5381 Other malaise: Secondary | ICD-10-CM | POA: Diagnosis not present

## 2019-11-11 DIAGNOSIS — G8929 Other chronic pain: Secondary | ICD-10-CM | POA: Diagnosis not present

## 2019-11-11 DIAGNOSIS — E785 Hyperlipidemia, unspecified: Secondary | ICD-10-CM | POA: Diagnosis not present

## 2019-11-11 DIAGNOSIS — L304 Erythema intertrigo: Secondary | ICD-10-CM | POA: Diagnosis not present

## 2019-11-11 DIAGNOSIS — B372 Candidiasis of skin and nail: Secondary | ICD-10-CM | POA: Diagnosis not present

## 2019-11-11 DIAGNOSIS — J9601 Acute respiratory failure with hypoxia: Secondary | ICD-10-CM | POA: Diagnosis not present

## 2019-11-11 DIAGNOSIS — I2699 Other pulmonary embolism without acute cor pulmonale: Secondary | ICD-10-CM | POA: Diagnosis not present

## 2019-11-11 DIAGNOSIS — M79671 Pain in right foot: Secondary | ICD-10-CM | POA: Diagnosis not present

## 2019-11-11 DIAGNOSIS — R0989 Other specified symptoms and signs involving the circulatory and respiratory systems: Secondary | ICD-10-CM | POA: Diagnosis not present

## 2019-11-11 DIAGNOSIS — F339 Major depressive disorder, recurrent, unspecified: Secondary | ICD-10-CM | POA: Diagnosis not present

## 2019-11-11 DIAGNOSIS — D519 Vitamin B12 deficiency anemia, unspecified: Secondary | ICD-10-CM | POA: Diagnosis not present

## 2019-11-11 DIAGNOSIS — J9611 Chronic respiratory failure with hypoxia: Secondary | ICD-10-CM | POA: Diagnosis not present

## 2019-11-11 DIAGNOSIS — F988 Other specified behavioral and emotional disorders with onset usually occurring in childhood and adolescence: Secondary | ICD-10-CM | POA: Diagnosis not present

## 2019-11-11 DIAGNOSIS — M79672 Pain in left foot: Secondary | ICD-10-CM | POA: Diagnosis not present

## 2019-11-11 DIAGNOSIS — F410 Panic disorder [episodic paroxysmal anxiety] without agoraphobia: Secondary | ICD-10-CM | POA: Diagnosis not present

## 2019-11-11 NOTE — Patient Instructions (Signed)
COPD - Continue Symbicort 160 two puffs twice daily and Spiriva Respimat 2.67mcg daily  - Continue Singulair 10mg  at bedtime  - Prednisone taper as prescribed on hospital discharge  - Advised patient to speak with nursing about getting prednisone taper and inhalers as ordered and case management about scheduling transportation for office visits - Continue to follow with Palliative care  Chronic respiratory failure - Continue home oxygen 2-4L, goal SpO2 88-90%  Pulmonary embolism: - Continue Eliquis 5mg  twice daily   Anxiety: - Continue Xanax 0.5 mg 3 times daily as needed; Risperdal 3 mg at bedtime and Seroquel 150 at bedtime  Follow-up: - Recommend office visit in 1 week with DR. Wert

## 2019-11-11 NOTE — Progress Notes (Signed)
Virtual Visit via Telephone Note  I connected with Julia Daniels on 11/11/19 at 11:30 AM EDT by telephone and verified that I am speaking with the correct person using two identifiers.  Location: Patient: Home Provider: Office   I discussed the limitations, risks, security and privacy concerns of performing an evaluation and management service by telephone and the availability of in person appointments. I also discussed with the patient that there may be a patient responsible charge related to this service. The patient expressed understanding and agreed to proceed.   Brief patient profile:  70 yowf  MS phenotype for alpha one who  quit smoking 08/23/17  With indolent  onset around 2014 doe gradually worse and started [facility] by [place] 2017 then admit [facility] late summer 2019 with dx of aecopd  And since then highly variable sob So self- referred to pulmonary clinic 10/31/2017 with GOLD III criteria for copd on initial eval but clinical course much more typical of ACOS  History of Present Illness: 69 year old female, former smoker quit in 2019 (40-pack-year history).  Past medical history significant for COPD mixed type, diastolic heart failure, chronic respiratory failure O2 dependant.  Patient of Dr. Melvyn Novas, last seen 12/27/2018.  Maintained on Symbicort 160 + Spiriva Respimat 2.82mcg and Fasenra.  She is taking Singular 10mg  daily (denies previous adverse reactions to this medication).  Previous [facility] pulmonary encounter: 07/15/2019 Contacted today by telephone for acute visit. She feels her breathing has worsened since May. States that it is hard to talk without becoming short of breath. She is under a tremendous amount of stress at home and with work. She had to call EMS one night. States that she was gasping for air, she used two nebulizer's without improvement and it was really scary. She is not that much better expect for when she increases her prednisone. She is currently taking prn prednisone  per Dr. Melvyn Novas. She increased dose to 30mg  x 3 days, then 20mg  x 3 days and is currently on 10mg . Her breathing worsened when she tapered from 30mg  to 20mg . She is having use her nebulizer once a night while at work. She works third shift. She is compliant with Symbicort and Spiriva respimat. She is not using aero chamber. She is taking Singulair 10mg  at bedtime, denies any adverse reactions to this. She is also on Fasenra injections. She missed fasenra appointment d/t work stress. She had to wait 1-2 weeks past when she was due. Her breathing seemed to go down hill after that. She has some cough. She states that EMS thought she might have fluid in her lungs.She had a CXR with PCP earlier last month which showed chronic hyperinflation and Insterstitial/bronchial thicking, no acute abnormality. She was prescroned a course of doxycycline.  She denies overt swelling in legs. She has put on some extra weight, she feels this is to eating a little more and not fluid retention. Needs new prescription for epipen.  11/11/2019- Interim hx Patient contacted today for hospital follow-up visit virtually.  Since August she has been admitted to the hospital 5 times with COPD exacerbation.  She was previously maintained on Symbicort 162 puffs twice daily and Spiriva Respimat 2 puffs once daily.  She is on Fasenra injections, she last received on August 18, 2019.  Appears she is 4 weeks overdue for Fasenra injection and has not received this. CTA chest showed nonocclusive PE within a segmental braonch pulmonary artery in the left upper lobe. No evidence of heart strain. Felt  to be provoked. Currently on Eliquis. PCCM saw patient during hospitalization in September. She was extremely anxious. Patient agreeing with palliative/hospice. Patient lives in skilled nursing facility.   She got back to skilled nursing facility this morning at 1am. States that her breathing is more labored today. She tells me that she has not received any of  her morning medications, inhalers and has not started the prednisone taper she was sent home on. She gets her medications delivered from [facility]. She tells me that she missed her last fasenra injection in October d/t being hospitalized and not being provided transportation from her skilled nursing facility.    Observations/Objective:  - Able to speak in mostly full sentences. Appears somewhat anxious. No overt wheezing or cough   TESTING: PFT's 03/06/18  FEV1 1.02 (43 % ) ratio 0.62  p 27 % improvement from saba p nothing prior to study with DLCO  59/63 % corrects to 77  % for alv volume  With ERV 65   Allergy profile 05/24/2018 >  Eos 0.6/  IgE 25 RAST neg     06/27/19 CXR- Chronic hyperinflation and interstitial/bronchial thickening suggesting COPD. No acute abnormality.  Echo 09/09/19 showed EF 55%, Grade 1 DD  CTA 11/04/19 showed non-occulsive PE left upper lobe. No evidence of heart strain.   Assessment and Plan:  COPD mixed type: - Severe obstruction; FEV1 34% pre and 43% post in February 2020 - Multiple hospital admissions for COPD exacerbation since August 2021. Over due for Fasenra injection, last received on August 8th 2021. She was getting injections every 8 weeks. Looks as if [name] was stopped during her hospital stay back on 10/21/19 for unknown reason - Maintained on Symbicort 160 two puffs twice daily and Spiriva Respimat 2.38mcg daily  - Continue Singulair 10mg  at bedtime  - Prednisone taper as prescribed on most recent hospital discharge  - Advised patient to speak with nursing about getting prednisone taper and inhalers as ordered and case management about scheduling transportation for office visits - Following with Palliative care - Recommend office visit in 1 week   Chronic respiratory failure with hypoxia - Continue home O2 2-4L, goal SpO2 88-90%  Pulmonary embolism: - CTA 11/04/19 showed non-occulsive PE left upper lobe. No evidence of heart strain. Felt to  have been unprovoked. - Continue Eliquis 5mg  twice daily   Heart failure - Echo 09/09/19 showed EF 55%, Grade 1 DD - She was given trial lasix 20mg  daily in mid-October which was discontinued during last hospitalization on 11/04/19  Anxiety: - Attempt to control anxiety to minimize copd exacerbations  - Continue Xanax 0.5 mg 3 times daily as needed; Risperdal 3 mg at bedtime and Seroquel 150mg  at bedtime   Follow Up Instructions:  1 week office follow-up with Dr. Melvyn Novas or [name] NP    I discussed the assessment and treatment plan with the patient. The patient was provided an opportunity to ask questions and all were answered. The patient agreed with the plan and demonstrated an understanding of the instructions.   The patient was advised to call back or seek an in-person evaluation if the symptoms worsen or if the condition fails to improve as anticipated.  I provided 22 minutes of non-face-to-face time during this encounter.   Martyn Ehrich, NP

## 2019-11-11 NOTE — Telephone Encounter (Signed)
[facility], spoke with Kallie Locks, scheduled patient for an office visit on 11/20/2019, she was advised the appointment is at 9:15 am and the patient should arrive by 9am for check in.  The facility will provide transportation.  Nothing further needed.

## 2019-11-11 NOTE — Telephone Encounter (Signed)
Placed a call to [facility] to see when her medications would be delivered.  I was told that she does not normally have her mediations delivered and she had not picked up any prescriptions since August 2021.  No prednisone had been sent in from the Hospital since July of 2021.  According to the notes from the nurse at the hospital, she was transferred to [facility], which is a skilled nursing facility.  I was on hold for 14 minutes trying to speak with the nurse to verify that she was getting her inhalers and prednisone taper after being discharged from the hospital, I spoke with Benjamine Mola, one of the nurses and she verified that she is getting her prednisone and her inhalers.  She also advised me that they can arrange transportation for patient to have an in person office visit.  Nothing further needed.

## 2019-11-12 ENCOUNTER — Telehealth: Payer: Self-pay | Admitting: Internal Medicine

## 2019-11-12 NOTE — Telephone Encounter (Signed)
Rec'd forms via fax from [facility] for short term disability. Will prepare forms for Dr. Melvyn Novas to complete at patient's visit on 11/20/2019.-pr

## 2019-11-13 ENCOUNTER — Telehealth: Payer: Self-pay | Admitting: Primary Care

## 2019-11-13 DIAGNOSIS — J9621 Acute and chronic respiratory failure with hypoxia: Secondary | ICD-10-CM | POA: Diagnosis not present

## 2019-11-13 DIAGNOSIS — I5032 Chronic diastolic (congestive) heart failure: Secondary | ICD-10-CM | POA: Diagnosis not present

## 2019-11-13 DIAGNOSIS — J449 Chronic obstructive pulmonary disease, unspecified: Secondary | ICD-10-CM | POA: Diagnosis not present

## 2019-11-13 DIAGNOSIS — I2699 Other pulmonary embolism without acute cor pulmonale: Secondary | ICD-10-CM | POA: Diagnosis not present

## 2019-11-13 NOTE — Telephone Encounter (Signed)
Spoke with the pt  She is aware to keep her appt to discuss increasing maintenance pred  She is currently on 60 mg and will taper down  Has appt and will discuss on 11/20/19   Julia Daniels- she has appt 11/20/19 at 9:15 and wants to know if she can get her facsenra that day. Please advise, thanks!

## 2019-11-13 NOTE — Telephone Encounter (Signed)
Called and spoke to patient made her aware of the fee and necessary paperwork that needs to be signed. Advised I will have paperwork available for her signature when she comes for her appt with Dr. Melvyn Novas on 11/20/2019-pr

## 2019-11-13 NOTE — Telephone Encounter (Signed)
I have injections 11/20/19 in the morning.  Patient has [name] here in office. I can do injection at her appointment.  Called and spoke with Patient.  Patient aware I can do her injection after her OV with Dr. Melvyn Novas.  I have her on injection schedule at 0915. Nothing further at this time.

## 2019-11-14 ENCOUNTER — Telehealth: Payer: Self-pay | Admitting: Primary Care

## 2019-11-14 DIAGNOSIS — J441 Chronic obstructive pulmonary disease with (acute) exacerbation: Secondary | ICD-10-CM | POA: Diagnosis not present

## 2019-11-14 DIAGNOSIS — R600 Localized edema: Secondary | ICD-10-CM | POA: Diagnosis not present

## 2019-11-14 DIAGNOSIS — F410 Panic disorder [episodic paroxysmal anxiety] without agoraphobia: Secondary | ICD-10-CM | POA: Diagnosis not present

## 2019-11-14 DIAGNOSIS — F411 Generalized anxiety disorder: Secondary | ICD-10-CM | POA: Diagnosis not present

## 2019-11-14 NOTE — Telephone Encounter (Signed)
Spoke with Patient earlier this week.  Patient has injection appointment already scheduled.  Patent is on injection schedule for 0915, at the time she comes to see Dr Melvyn Novas 11/20/19. I will administer [name] after she sees Dr Melvyn Novas.   [name] already delivered to office. Nothing further is needed.

## 2019-11-14 NOTE — Telephone Encounter (Signed)
We need to schedule patient for Fasenra injection

## 2019-11-14 NOTE — Telephone Encounter (Addendum)
-----   Message from Tanda Rockers, MD sent at 11/11/2019  1:47 PM EDT ----- I would continue it if possible  ----- Message ----- From: Martyn Ehrich, NP Sent: 11/11/2019   1:20 PM EDT To: Tanda Rockers, MD  [name] was d/c' d during 10/21/19- 10/24/19 hospitalization, does not state why in discharge summary

## 2019-11-18 DIAGNOSIS — J449 Chronic obstructive pulmonary disease, unspecified: Secondary | ICD-10-CM

## 2019-11-19 NOTE — Telephone Encounter (Signed)
Charge has been dropped and advising front desk to collect fee and have pt sign forms when she arrives for her appt tomorrow 11/20/2019 @ 9:15. - Forms will be given to Dr. Melvyn Novas to complete. -pr

## 2019-11-20 ENCOUNTER — Other Ambulatory Visit: Payer: Self-pay

## 2019-11-20 ENCOUNTER — Ambulatory Visit: Payer: BC Managed Care – PPO

## 2019-11-20 ENCOUNTER — Ambulatory Visit: Payer: BC Managed Care – PPO | Admitting: Internal Medicine

## 2019-11-20 ENCOUNTER — Encounter: Payer: Self-pay | Admitting: Internal Medicine

## 2019-11-20 VITALS — BP 106/62 | HR 110 | Temp 97.0°F | Wt 211.2 lb

## 2019-11-20 DIAGNOSIS — J9611 Chronic respiratory failure with hypoxia: Secondary | ICD-10-CM | POA: Diagnosis not present

## 2019-11-20 DIAGNOSIS — J9612 Chronic respiratory failure with hypercapnia: Secondary | ICD-10-CM

## 2019-11-20 DIAGNOSIS — J4550 Severe persistent asthma, uncomplicated: Secondary | ICD-10-CM

## 2019-11-20 DIAGNOSIS — J449 Chronic obstructive pulmonary disease, unspecified: Secondary | ICD-10-CM | POA: Diagnosis not present

## 2019-11-20 DIAGNOSIS — I10 Essential (primary) hypertension: Secondary | ICD-10-CM | POA: Diagnosis not present

## 2019-11-20 MED ORDER — IRBESARTAN 150 MG PO TABS: 150.0000 mg | ORAL_TABLET | Freq: Every day | ORAL | 11 refills | Status: DC

## 2019-11-20 MED ORDER — BENRALIZUMAB 30 MG/ML ~~LOC~~ SOSY
30.0000 mg | PREFILLED_SYRINGE | Freq: Once | SUBCUTANEOUS | Status: AC
  Administered 2019-11-20: 30 mg via SUBCUTANEOUS

## 2019-11-20 MED ORDER — BUDESONIDE 0.5 MG/2ML IN SUSP: 0.5000 mg | Freq: Two times a day (BID) | RESPIRATORY_TRACT | 12 refills | Status: DC

## 2019-11-20 MED ORDER — STIOLTO RESPIMAT 2.5-2.5 MCG/ACT IN AERS: 2.0000 | INHALATION_SPRAY | Freq: Every day | RESPIRATORY_TRACT | 11 refills | Status: DC

## 2019-11-20 NOTE — Patient Instructions (Addendum)
Make sure you check your oxygen saturations at highest level of activity to be sure it stays over 90% and adjust  02 flow upward to maintain this level if needed but remember to turn it back to previous settings when you stop (to conserve your supply).   Change maintenance (daily/ automatically/ timed) from symbicort and spiriva to   Stiolto 2 puffs each am    Budesonide 0.5 mg per neb  Twice daily   Only use your albuterol nebuilizer  as a rescue medication to be used if you can't catch your breath by resting or doing a relaxed purse lip breathing pattern.  - The less you use it, the better it will work when you need it. - Ok to use up to 4 hours if you must but call for immediate appointment if use goes up over your usual need  Try albuterol 15 min before an activity that you know would make you short of breath and see if it makes any difference and if makes none then don't take it after activity unless you can't catch your breath.      Reduce prednisone to 40 mg daily for a week,  30 mg for a week, 20 mg for a week then 10 mg daily   Stop lisinopril   Start  avapro  150 mg one daily    Please schedule a follow up office visit in 2 weeks, sooner if needed  with all medications /inhalers/ solutions in hand so we can verify exactly what you are taking. This includes all medications from all doctors and over the counters

## 2019-11-20 NOTE — Assessment & Plan Note (Addendum)
Quit smoking 08/2017  Spirometry 10/31/2017  FEV1 0.7 (30%)  Ratio 51 p Breo in am   - 10/31/2017  Eos = 0.9  - alpha one AT screen 10/31/2017   MS level 121  - 10/31/2017   Walked RA x one lap @ 185 stopped due to sob s desats at fast pace   - 01/18/2018   Walked RA  2 laps @ 230ft each @ fast  pace  stopped due to sob no desat  - Spirometry 01/18/2018  FEV1 1.1 (49%)  Ratio 63 with mild curvature after am spiriva and symbicort  - 02/11/2018    added prednisone as plan D / changed spiriva to smi - PFT's 03/06/18  FEV1 1.02 (43 % ) ratio 0.62  p 27 % improvement from saba p nothing prior to study with DLCO  59/63 % corrects to 77  % for alv volume  With ERV 65  - Singulair 03/14/2018 >> d/c'd 10/10/2018 s adverse effects - Allergy profile 05/24/2018 >  Eos 0.6/  IgE 25 RAST neg    - 07/03/2018  After extensive coaching inhaler device,  effectiveness =    90% finally and still flares w/in a week or two off prednisone so rec fasenra trial and continue pred with ceiling of 20 and floor of 0 as plan D -  08/28/18 started fasenra > marked clinical improvement -  07/25/2019  After extensive coaching inhaler device,  effectiveness =    75% (short Ti)  - 11/20/2019  After extensive coaching inhaler device,  effectiveness =    75% with smi > change to stiolto 2 each am / budesonide 0.5 bid per neb/ pred floor of 10 mg daily and d/c acei   DDX of  difficult airways management almost all start with A and  include Adherence, Ace Inhibitors, Acid Reflux, Active Sinus Disease, Alpha 1 Antitripsin deficiency, Anxiety masquerading as Airways dz,  ABPA,  Allergy(esp in young), Aspiration (esp in elderly), Adverse effects of meds,  Active smoking or vaping, A bunch of PE's (a small clot burden can't cause this syndrome unless there is already severe underlying pulm or vascular dz with poor reserve) plus two Bs  = Bronchiectasis and Beta blocker use..and one C= CHF  Adherence is always the initial "prime suspect" and is a  multilayered concern that requires a "trust but verify" approach in every patient - starting with knowing how to use medications, especially inhalers, correctly, keeping up with refills and understanding the fundamental difference between maintenance and prns vs those medications only taken for a very short course and then stopped and not refilled.  - see smi teaching - return with all meds in hand using a trust but verify approach to confirm accurate Medication  Reconciliation The principal here is that until we are certain that the  patients are doing what we've asked, it makes no sense to ask them to do more.   ACEi adverse effects at the  top of the usual list of suspects and the only way to rule it out is a trial off > see sep a/p     Allergy > continue singulair / prednisone - The goal with a chronic steroid dependent illness is always arriving at the lowest effective dose that controls the disease/symptoms and not accepting a set "formula" which is based on statistics or guidelines that don't always take into account patient  variability or the natural hx of the dz in every individual patient, which may well vary  over time.  For now therefore I recommend the patient maintain  Ceiling of 40 mg and floor of 10 mg for now plus continue fosenra  Re saba: I spent extra time with pt today reviewing appropriate use of albuterol for prn use on exertion with the following points: 1) saba is for relief of sob that does not improve by walking a slower pace or resting but rather if the pt does not improve after trying this first. 2) If the pt is convinced, as many are, that saba helps recover from activity faster then it's easy to tell if this is the case by re-challenging : ie stop, take the inhaler, then p 5 minutes try the exact same activity (intensity of workload) that just caused the symptoms and see if they are substantially diminished or not after saba 3) if there is an activity that reproducibly  causes the symptoms, try the saba 15 min before the activity on alternate days   If in fact the saba really does help, then fine to continue to use it prn but advised may need to look closer at the maintenance regimen being used to achieve better control of airways disease with exertion.    ? Acid (or non-acid) GERD > always difficult to exclude as up to 75% of pts in some series report no assoc GI/ Heartburn symptoms> rec continue max (24h)  acid suppression and diet restrictions/ reviewed     ? Anxiety > usually at the bottom of this list of usual suspects but should be much higher on this pt's based on H and P and note already on psychotropics and may interfere with adherence and also interpretation of response or lack thereof to symptom management which can be quite subjective.   ? A bunch of PE's > rx with eliquis

## 2019-11-20 NOTE — Assessment & Plan Note (Signed)
HC03   11/06/19  = 37  >>> as of 11/20/2019  4lpm 24 /7 but titrate with exertion/ low 90s good target   Advised: Make sure you check your oxygen saturations at highest level of activity to be sure it stays over 90% and adjust  02 flow upward to maintain this level if needed but remember to turn it back to previous settings when you stop (to conserve your supply).           Each maintenance medication was reviewed in detail including emphasizing most importantly the difference between maintenance and prns and under what circumstances the prns are to be triggered using an action plan format where appropriate.  Total time for H and P, chart review, counseling, teaching device and generating customized AVS unique to this office visit / charting = 56 min

## 2019-11-20 NOTE — Progress Notes (Signed)
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 Pulmonary Consult    Self referral for COPD. Pt was dxed with COPD 2 2yrs ago. She has been seen in the past by Dr Verdie Mosher. She states she gets tired easily. She gets winded with carrying something heavy and some days with minimal walking. She uses proair once daily on average and neb with albuterol 3-4 x per wk.   Dyspnea:  Best days doe = MMRC3 = can't walk 100 yards even at a slow pace at a flat grade s stopping due to sob  / better p a few hours but highly variable s pattern  Cough: better since stopped smoking Sleep: bed is flat/ 2 pillows / no am flare  SABA use: neb helps the most  rec Plan A = Automatic = symbicort 160 Take 2 puffs first thing in am and then another 2 puffs about 12 hours later.  Work on inhaler technique:  Plan B = Backup Only use your albuterol as a rescue medication Plan C = Crisis - only use your albuterol nebulizer if you first try Plan B and it fails to help  Plan D = Deltasone  If worse despite A thru C >  Try Deltasone = Prednisone 10 mg take  4 each am x 2 days,   2 each am x 2 days,  1 each am x 2 days and stop       11/08/2017  f/u ov/Firmin Belisle re:  GOLD III / no better on symb 160 2bid  Chief Complaint  Patient presents with  . Follow-up    c/o sob with exertion, using rescue inhaler daily.   Dyspnea:  Not really much better, never used prednisone as rec  Cough:  no Sleeping: bed flat 2 pillows  SABA use: about twice daily hfa and neb  02: none  rec Go ahead and take the prednisone you have on hand> a lot better and maintained on symb 160 2bid   Work on inhaler technique:  Only use your albuterol as a rescue medication        01/18/2018  f/u ov/Daneil Beem re:  Copd GOLD III  symbicort / spiriva handihaler  Still on pred/ last day Chief Complaint  Patient presents with  . Follow-up    Breathing is okay today. She has had another hospital stay since last visit for increased SOB. She has not use her rescue inhaler or neb since last hospitalization.   Dyspnea:  MMRC2 = can't walk a nl pace on a flat grade s sob but does fine slow and flat  Cough: none Sleeping:  Bed flat/ one pillow  SABA use: none now   02: none  Last flare occurred at work per H&P notes and she did not respond initially to saba  rec Plan A = Automatic = symbicort 160 Take 2 puffs and  spiriva  first thing when you wake up  and then another 2 puffs of symbicort about 12 hours later. Also add Pantoprazole (protonix) 40 mg   Take  30-60 min before first meal of the day and Pepcid (famotidine)  20 mg one @  bedtime until return to office - this is the best way to tell whether stomach acid is contributing to your problem.   Work on inhaler technique:  Plan B = Backup Only use your albuterol as a rescue medication Plan C = Crisis - only use your albuterol nebulizer if you first try Plan B and it fails to help > ok to use the nebulizer up to every 4 hours but if start needing it regularly call for immediate appointment Plan D = Deltasone  If worse despite A thru C >  Try Deltasone = Prednisone 10 mg take  4 each am x 2 days,   2 each am x 2 days,  1 each am x 2 days and stop    GERD (REFLUX)  is an extremely common cause of respiratory symptoms just like yours , many times with no obvious heartburn at all.  Please keep appt for pfts  with all medications /inhalers/ solutions in hand so we  can verify exactly what you are taking. This includes all medications from all doctors and over the counters        07/03/2018  f/u ov/Numa Heatwole re:  ACOS / freq prednisone need despite sym 160/spiriva smi and singulair max rx for gerd and now 100% adherent as far as can be detected  Chief Complaint  Patient presents with  . Follow-up    Started having increased SOB approx 1 wk ago- had to use neb and rescue inhaler and so started on prednisone and currently taking 30 mg- breathing has improved some.   Dyspnea:  On prednisone MMRC2 = can't walk a nl pace on a flat grade s sob but does fine slow and flat  And off prednisone x sev weeks  > sob at rest so presently back on pred still 30 mg daily  Cough: more at work / min prod mucoid  Sleeping: ok cool room / bed blocks  SABA use: none  02: only when flaring rec We will see if we can qualify you for Fasenra injections > started 08/28/18 Work on inhaler technique:      10/03/2018  f/u ov/Alanda Colton re: ACOS  Chief Complaint  Patient presents with  . Follow-up    Breathing is much improved on Fascenra. She rarely uses her albuterol inhaler or neb.   Dyspnea:  MMRC2 = can't walk a nl pace on a flat grade s sob but does fine slow and flat  Cough: still some spells  Sleeping: fine rec Stop your singulair when you finish the bottle, ok to resume if you notice any worse respiratory symptoms   12/27/2018  f/u ov/Marwa Fuhrman re: acos/ on fosrenra/ symb/spiriva  Chief Complaint  Patient presents with  . Follow-up    COPD GOLD ? III with increased Eos and reversibility ? ACOS ?  Dyspnea:  Walking at work / walks across parking/ no steps = MMRC1 = can walk nl pace, flat grade, can't hurry or go uphills or steps s sob   Cough: better  Sleeping: fine on bed blocks x 8 in SABA use: none 02: none  rec Try off pantoprazole and change pepcid to 20 mg after bfast and supper x one month then try off the morning  dose  Work on inhaler technique:  relax and gently  blow all the way out then take a nice smooth deep breath back in, triggering the inhaler at same time you start breathing in.   Rinse and gargle with water when done No other changes in your medications but you are a good candidate for BRESTRI   Televisit/ NP  07/15/19 for aecopd rec - Continue Symbicort 160 two puffs twice daily; Spiriva Respimat 2 puffs once daily - Continue Fasenra injections every 8 weeks as scheduled - Continue Singulair 10 mg at bedtime - Rx prednisone taper (40 mg x 3 days, 30 mg x 3 days, 20 mg x 3 days, 10 mg x 3 days, 5 mg x 3 days then stop); Epipen 0.3mg /0.52ml refilled d/t being out of date      07/25/2019  f/u ov/Lameshia Hypolite AS:NKNL  symptoms worse even before missed a dose of fosenra but received 06/20/19 / no longer on gerd rx and tendency to noct flares up Chief Complaint  Patient presents with  . Follow-up  Dyspnea:  Walking all over one floor at work ok but parking lot to building stops half way Cough: none Sleeping: bed blocks / still with noct flares SABA use: last 3 weeks needing neb as well  02: prn  rec Pantoprazole (protonix) 40 mg   Take  30-60 min before first meal of the day and Pepcid (famotidine)  20 mg one @  bedtime until return to office - this is the best way to tell whether stomach acid is contributing to your problem.   GERD  diet Take Prednisone  4 for three days 3 for three days 2 for three days 1 for three days and stop  Work on inhaler technique:        Admit date: 11/04/2019 Discharge date: 11/10/2019  Discharge Diagnoses:  Active Problems: Acute on chronic respiratory failure with hypoxia (HCC) COPD with acute exacerbation. Pulmonary embolism  Hospital Course:  Patient is a  24 yowf with past medical history significant for diastolic congestive heart failure, COPD on home oxygen (2-4L/min via nasal cannula), asthma and morbid obesity.  Patient presented to the emergency department from [place] with complaints  of shortness. Shortness of breath started about a day  prior to presentation, and the inhalers did not help.  Patient also reporting some lower extremity edema. Patient was admitted to rehab on 9/4 due to ambulation problem, back pain/C6 compression fracture. On presentation, patient was hypotensive, tachycardic, and hypoxic requiring high flow oxygen. CT angiogram of the chest revealed nonocclusive PE of the left upper lobe. Patient was admitted and managed for pulmonary embolism, acute on chronic hypoxic respiratory failure and COPD exacerbation.  Patient is currently on apixaban.  Patient was managed with IV Solu-Medrol and nebulizer treatment.  Patient symptoms have resolved.  Patient will be discharged back to the facility.  Acute on chronic hypoxic respiratory failure: -Required high flow oxygen on presentation. Also wheezing on presentation. CT angiogram showed PE. -Acute respiratory failure due to combination of PE and COPD exacerbation. -Patient was managed with anticoagulation, IV Solu-Medrol and nebulizer treatment. -Patient has improved significantly, and is back to baseline. -Patient be discharged back to the skilled nursing facility for rehab.   -Patient will need to follow-up with a primary care provider on discharge.    Acute PE: -CT angio showing nonocclusive pulmonary embolus within a segmental branch pulmonary artery of the left upper lobe with involvement of 2 subsegmental branches. -No central pulmonary  emboli. No evidence of right heart strain. She was started on Lovenox on admission.-Continue Eliquis. -PE seems unprovoked. -Likely lifelong anticoagulation. Aspirin is on hold.  -Echo done on this admission showed EF of 50 to 55%, no wall motion abnormality, grade 1 diastolic dysfunction.  Asthma/COPD exacerbation: -See above. -Manage with IV Solu-Medrol.  Patient was eventually transitioned to oral prednisone 60 mg p.o. once daily.  Patient be  discharged on tapering dose of prednisone.   -Continue Neb treatment -Follow up with Pulmonologist on discharge.  Diastolic congestive heart failure/bilateral lower extremity edema: -History ofhistory of diastolic congestive heart failure.  -Stable.  Hyperlipidemia:Continue Lipitor  Back pain/vertebral compression fracture: CT also showed interval progression of severe compression fracture of the T5 vertebral body. She might need to follow-up with IR for kyphoplasty after resolution of her respiratory issues. She can do that as an outpatient.. Currently she is residing at skilled nursing facility for rehabilitation. PT/OT consulted  Hypertension:  -BP stable.  -Primary care provider to continue optimizing patient's blood pressure.    GERD:Continue Protonix  Vitamin B12 deficiency:Started on supplementation.  History of anxiety/bipolar disorder: On lamotrigine, quetiapine, risperidone, Effexor  Debility/deconditioning: Admitted from [place]. Plan is to discharge back to the same facility.    Consults: None  Significant Diagnostic Studies:  CTA of the chest revealed: 1. Nonocclusive pulmonary embolus within a segmental branch pulmonary artery of the left upper lobe with involvement of 2 subsegmental branches. No central pulmonary emboli. No evidence of right heart strain. 2. Severe compression deformity of the T4 vertebral body, new from prior. 3. Interval progression of severe compression fracture of the T5 vertebral body. There is approximately 4 mm of bony retropulsion of the T5 fracture site, progressed from prior. 4. Lungs are clear. 5. Aortic atherosclerosis   NP ov 11/11/19 recs COPD - Continue Symbicort 160 two puffs twice daily and Spiriva Respimat 2.82mcg daily  - Continue Singulair 10mg  at bedtime  - Prednisone taper as prescribed on hospital discharge  - Advised patient to speak with nursing about getting prednisone taper  and inhalers as ordered and case management about scheduling transportation for office visits - Continue to follow with Palliative care  Chronic respiratory failure - Continue home oxygen 2-4L, goal SpO2 88-90%  Pulmonary embolism: - Continue Eliquis 5mg  twice daily   Anxiety: - Continue Xanax 0.5 mg 3 times daily as needed; Risperdal 3 mg at bedtime and Seroquel 150 at bedtime     11/20/2019  f/u ov/Maecie Sevcik re:  Prednisone 60 mg / fasenra today / in snf for a few more weeks  Chief Complaint  Patient presents with  . Hospitalization Follow-up    does well when she is on the prednisone, but when she goes to a lower dose her breathing gets worse.   Dyspnea:  Ok at rest / using walker to get bathroom and needs neb first if near end of the every 4 hours dosing  Cough: better now Sleeping: 60 degrees SABA use: way too much  02: 24/7  At 4lpm 24/7    No obvious day to day or daytime variability or assoc excess/ purulent sputum or mucus plugs or hemoptysis or cp or chest tightness, subjective wheeze or overt sinus or hb symptoms.   Sleeping as above  without nocturnal  or early am exacerbation  of respiratory  c/o's or need for noct saba. Also denies any obvious fluctuation of symptoms with weather or environmental changes or other aggravating or alleviating factors except as outlined  above   No unusual exposure hx or h/o childhood pna/ asthma or knowledge of premature birth.  Current Allergies, Complete Past Medical History, Past Surgical History, Family History, and Social History were reviewed in [employer] record.  ROS  The following are not active complaints unless bolded Hoarseness, sore throat, dysphagia, dental problems, itching, sneezing,  nasal congestion or discharge of excess mucus or purulent secretions, ear ache,   fever, chills, sweats, unintended wt loss or wt gain, classically pleuritic or exertional cp,  orthopnea pnd or arm/hand swelling  or leg  swelling, presyncope, palpitations, abdominal pain, anorexia, nausea, vomiting, diarrhea  or change in bowel habits or change in bladder habits, change in stools or change in urine, dysuria, hematuria,  rash, arthralgias, visual complaints, headache, numbness, weakness or ataxia or problems with walking or coordination,  change in mood or  memory.        Current Meds  Medication Sig  . albuterol (PROAIR HFA) 108 (90 Base) MCG/ACT inhaler Inhale 2 puffs into the lungs every 6 (six) hours as needed for wheezing or shortness of breath.  Marland Kitchen albuterol (PROVENTIL) (2.5 MG/3ML) 0.083% nebulizer solution Take 3 mLs (2.5 mg total) by nebulization every 6 (six) hours as needed for wheezing or shortness of breath.  . ALPRAZolam (XANAX) 0.5 MG tablet Take 1 tablet (0.5 mg total) by mouth 3 (three) times daily as needed for anxiety.  Marland Kitchen apixaban (ELIQUIS) 5 MG TABS tablet Apixaban 10 mg po bid for 3 days, then decrease dose to 5 mg po Bid  . atorvastatin (LIPITOR) 40 MG tablet Take 1 tablet (40 mg total) by mouth daily. (Patient taking differently: Take 40 mg by mouth at bedtime. )  . baclofen (LIORESAL) 10 MG tablet Take 10 mg by mouth 3 (three) times daily.   . barrier cream (NON-SPECIFIED) CREA Apply 1 application topically daily as needed (apply to rash).  . budesonide-formoterol (SYMBICORT) 160-4.5 MCG/ACT inhaler INHALE 2 PUFFS INTO LUNGS TWICE A DAY (Patient taking differently: Inhale 2 puffs into the lungs in the morning and at bedtime. )  . famotidine (PEPCID) 20 MG tablet One at bedtime (Patient taking differently: Take 20 mg by mouth at bedtime. )  . guaiFENesin (MUCINEX) 600 MG 12 hr tablet Take 1 tablet (600 mg total) by mouth 2 (two) times daily. (Patient taking differently: Take 1,200 mg by mouth 2 (two) times daily. )  . Infant Care Products [facility]) OINT Apply 1 application topically daily as needed (rash).  Marland Kitchen lamoTRIgine (LAMICTAL) 200 MG tablet Take 200 mg by mouth at bedtime.   . lidocaine  (LIDODERM) 5 % Place 1 patch onto the skin daily. Remove & Discard patch within 12 hours or as directed by MD  . lisinopril (ZESTRIL) 40 MG tablet Take 1 tablet (40 mg total) by mouth daily.  Marland Kitchen loratadine (CLARITIN) 10 MG tablet Take 10 mg by mouth every evening.   . Methylphenidate HCl ER 72 MG TBCR Take 72 mg by mouth daily.  . montelukast (SINGULAIR) 10 MG tablet Take 1 tablet (10 mg total) by mouth at bedtime.  . pantoprazole (PROTONIX) 40 MG tablet Take 1 tablet (40 mg total) by mouth daily. Take 30-60 min before first meal of the day  . predniSONE (DELTASONE) 10 MG tablet Prednisone 60 mg po daily for 4 days, then 40 mg po daily for 4 days, then 30 mg po daily for 4 days, then 20 mg po daily for 4 days, then 10 mg po daily for  4 days and stop  . QUEtiapine (SEROQUEL) 100 MG tablet Take 1.5 tablets (150 mg total) by mouth at bedtime.  . risperiDONE (RISPERDAL) 3 MG tablet Take 1 tablet (3 mg total) by mouth at bedtime.  . senna-docusate (SENOKOT-S) 8.6-50 MG tablet Take 1 tablet by mouth 2 (two) times daily.  . sodium chloride (OCEAN) 0.65 % SOLN nasal spray Place 1 spray into both nostrils as needed for congestion.  . Tiotropium Bromide Monohydrate (SPIRIVA RESPIMAT) 2.5 MCG/ACT AERS Inhale 2 puffs into the lungs daily.  Marland Kitchen venlafaxine XR (EFFEXOR-XR) 150 MG 24 hr capsule Take 150 mg by mouth at bedtime.   . vitamin B-12 (CYANOCOBALAMIN) 500 MCG tablet Take 1 tablet (500 mcg total) by mouth daily.  . Vitamin D, Ergocalciferol, (DRISDOL) 50000 units CAPS capsule Take 50,000 Units by mouth every Saturday.                Objective:    11/20/2019      211 07/25/2019        193  12/27/2018      193 10/03/2018        199  07/03/2018        205  05/24/2018        204  02/11/2018          197   01/18/2018        199   12/04/2017     191   11/08/17 189 lb (85.7 kg)  10/31/17 187 lb (84.8 kg)  09/18/17 179 lb 0.2 oz (81.2 kg)     Vital signs reviewed  11/20/2019  - Note at rest 02 sats  99%  on 4lpm       reported:full dentures    W/c bound more chronically than acutely ill appearing anxious pale wf nad   HEENT : pt wearing mask not removed for exam due to covid -19 concerns.    NECK :  without JVD/Nodes/TM/ nl carotid upstrokes bilaterally   LUNGS: no acc muscle use,  Nl contour chest which is clear to A and P bilaterally without cough on insp or exp maneuvers   CV:  RRR  no s3 or murmur or increase in P2, and no edema   ABD:  soft and nontender with nl inspiratory excursion in the supine position. No bruits or organomegaly appreciated, bowel sounds nl  MS:  Nl gait/ ext warm without deformities, calf tenderness, cyanosis or clubbing No obvious joint restrictions   SKIN: warm and dry without lesions    NEURO:  alert, approp, nl sensorium with  no motor or cerebellar deficits apparent.      I personally reviewed images and agree with radiology impression as follows:   Chest CTa  11/04/19 1. Nonocclusive pulmonary embolus within a segmental branch pulmonary artery of the left upper lobe with involvement of 2 subsegmental branches. No central pulmonary emboli. No evidence of right heart strain. 2. Severe compression deformity of the T4 vertebral body, new from prior. 3. Interval progression of severe compression fracture of the T5 vertebral body. There is approximately 4 mm of bony retropulsion of the T5 fracture site, progressed from prior. 4. Lungs are clear.     Assessment

## 2019-11-20 NOTE — Assessment & Plan Note (Signed)
Noted 07/25/2019  - d/c acei 11/20/2019 due to dtc asthma  In the best review of chronic cough to date ( NEJM 2016 375 9009-2004) ,  ACEi are now felt to cause cough in up to  20% of pts which is a 4 fold increase from previous reports and does not include the variety of non-specific complaints we see in pulmonary clinic in pts on ACEi but previously attributed to another dx like  Copd/asthma and  include PNDS, throat and chest congestion, "bronchitis", unexplained dyspnea and noct "strangling" sensations, and hoarseness, but also  atypical /refractory GERD symptoms like dysphagia and "bad heartburn"   The only way I know  to prove this is not an "ACEi Case" is a trial off ACEi x a minimum of 6 weeks then regroup.    >>>> try avapro 150 mg daily and d/c zestril

## 2019-11-21 NOTE — Telephone Encounter (Signed)
Patient came and signed our forms - paid fee. Faxed forms to [facility] [phone]. -pr

## 2019-11-24 DIAGNOSIS — M79672 Pain in left foot: Secondary | ICD-10-CM | POA: Diagnosis not present

## 2019-11-24 DIAGNOSIS — M79671 Pain in right foot: Secondary | ICD-10-CM | POA: Diagnosis not present

## 2019-11-24 DIAGNOSIS — B372 Candidiasis of skin and nail: Secondary | ICD-10-CM | POA: Diagnosis not present

## 2019-11-24 DIAGNOSIS — L304 Erythema intertrigo: Secondary | ICD-10-CM | POA: Diagnosis not present

## 2019-11-25 DIAGNOSIS — J9621 Acute and chronic respiratory failure with hypoxia: Secondary | ICD-10-CM | POA: Diagnosis not present

## 2019-11-25 DIAGNOSIS — R5381 Other malaise: Secondary | ICD-10-CM | POA: Diagnosis not present

## 2019-11-25 DIAGNOSIS — J9622 Acute and chronic respiratory failure with hypercapnia: Secondary | ICD-10-CM | POA: Diagnosis not present

## 2019-11-25 DIAGNOSIS — J441 Chronic obstructive pulmonary disease with (acute) exacerbation: Secondary | ICD-10-CM | POA: Diagnosis not present

## 2019-11-28 DIAGNOSIS — F411 Generalized anxiety disorder: Secondary | ICD-10-CM | POA: Diagnosis not present

## 2019-11-28 DIAGNOSIS — F410 Panic disorder [episodic paroxysmal anxiety] without agoraphobia: Secondary | ICD-10-CM | POA: Diagnosis not present

## 2019-11-29 ENCOUNTER — Other Ambulatory Visit: Payer: Self-pay | Admitting: Internal Medicine

## 2019-12-01 DIAGNOSIS — J9621 Acute and chronic respiratory failure with hypoxia: Secondary | ICD-10-CM | POA: Diagnosis not present

## 2019-12-01 DIAGNOSIS — J441 Chronic obstructive pulmonary disease with (acute) exacerbation: Secondary | ICD-10-CM | POA: Diagnosis not present

## 2019-12-01 DIAGNOSIS — I2699 Other pulmonary embolism without acute cor pulmonale: Secondary | ICD-10-CM | POA: Diagnosis not present

## 2019-12-01 DIAGNOSIS — F411 Generalized anxiety disorder: Secondary | ICD-10-CM | POA: Diagnosis not present

## 2019-12-04 ENCOUNTER — Inpatient Hospital Stay (HOSPITAL_COMMUNITY)
Admission: EM | Admit: 2019-12-04 | Discharge: 2019-12-12 | DRG: 291 | Disposition: A | Discharge status: Skilled Nursing Facility | Payer: BC Managed Care – PPO | Attending: Internal Medicine | Admitting: Internal Medicine

## 2019-12-04 ENCOUNTER — Emergency Department (HOSPITAL_COMMUNITY): Payer: BC Managed Care – PPO

## 2019-12-04 DIAGNOSIS — J9622 Acute and chronic respiratory failure with hypercapnia: Secondary | ICD-10-CM | POA: Diagnosis not present

## 2019-12-04 DIAGNOSIS — I10 Essential (primary) hypertension: Secondary | ICD-10-CM | POA: Diagnosis not present

## 2019-12-04 DIAGNOSIS — Z87891 Personal history of nicotine dependence: Secondary | ICD-10-CM

## 2019-12-04 DIAGNOSIS — Z9981 Dependence on supplemental oxygen: Secondary | ICD-10-CM

## 2019-12-04 DIAGNOSIS — J441 Chronic obstructive pulmonary disease with (acute) exacerbation: Secondary | ICD-10-CM | POA: Diagnosis not present

## 2019-12-04 DIAGNOSIS — E872 Acidosis: Secondary | ICD-10-CM | POA: Diagnosis not present

## 2019-12-04 DIAGNOSIS — M549 Dorsalgia, unspecified: Secondary | ICD-10-CM

## 2019-12-04 DIAGNOSIS — G8929 Other chronic pain: Secondary | ICD-10-CM | POA: Diagnosis not present

## 2019-12-04 DIAGNOSIS — Z66 Do not resuscitate: Secondary | ICD-10-CM | POA: Diagnosis not present

## 2019-12-04 DIAGNOSIS — Z86711 Personal history of pulmonary embolism: Secondary | ICD-10-CM | POA: Diagnosis not present

## 2019-12-04 DIAGNOSIS — I5023 Acute on chronic systolic (congestive) heart failure: Secondary | ICD-10-CM | POA: Diagnosis not present

## 2019-12-04 DIAGNOSIS — Z7901 Long term (current) use of anticoagulants: Secondary | ICD-10-CM

## 2019-12-04 DIAGNOSIS — I2510 Atherosclerotic heart disease of native coronary artery without angina pectoris: Secondary | ICD-10-CM | POA: Diagnosis present

## 2019-12-04 DIAGNOSIS — K219 Gastro-esophageal reflux disease without esophagitis: Secondary | ICD-10-CM | POA: Diagnosis not present

## 2019-12-04 DIAGNOSIS — Z825 Family history of asthma and other chronic lower respiratory diseases: Secondary | ICD-10-CM

## 2019-12-04 DIAGNOSIS — R0902 Hypoxemia: Secondary | ICD-10-CM

## 2019-12-04 DIAGNOSIS — R140 Abdominal distension (gaseous): Secondary | ICD-10-CM | POA: Diagnosis not present

## 2019-12-04 DIAGNOSIS — I5032 Chronic diastolic (congestive) heart failure: Secondary | ICD-10-CM | POA: Diagnosis not present

## 2019-12-04 DIAGNOSIS — D638 Anemia in other chronic diseases classified elsewhere: Secondary | ICD-10-CM | POA: Diagnosis present

## 2019-12-04 DIAGNOSIS — J45901 Unspecified asthma with (acute) exacerbation: Secondary | ICD-10-CM | POA: Diagnosis not present

## 2019-12-04 DIAGNOSIS — I509 Heart failure, unspecified: Secondary | ICD-10-CM | POA: Diagnosis not present

## 2019-12-04 DIAGNOSIS — Z741 Need for assistance with personal care: Secondary | ICD-10-CM | POA: Diagnosis not present

## 2019-12-04 DIAGNOSIS — I110 Hypertensive heart disease with heart failure: Secondary | ICD-10-CM | POA: Diagnosis not present

## 2019-12-04 DIAGNOSIS — E785 Hyperlipidemia, unspecified: Secondary | ICD-10-CM | POA: Diagnosis present

## 2019-12-04 DIAGNOSIS — Z882 Allergy status to sulfonamides status: Secondary | ICD-10-CM

## 2019-12-04 DIAGNOSIS — M47816 Spondylosis without myelopathy or radiculopathy, lumbar region: Secondary | ICD-10-CM | POA: Diagnosis present

## 2019-12-04 DIAGNOSIS — J9620 Acute and chronic respiratory failure, unspecified whether with hypoxia or hypercapnia: Secondary | ICD-10-CM | POA: Diagnosis present

## 2019-12-04 DIAGNOSIS — Z801 Family history of malignant neoplasm of trachea, bronchus and lung: Secondary | ICD-10-CM

## 2019-12-04 DIAGNOSIS — I700 Atherosclerosis of aorta: Secondary | ICD-10-CM | POA: Diagnosis not present

## 2019-12-04 DIAGNOSIS — R0602 Shortness of breath: Secondary | ICD-10-CM | POA: Diagnosis not present

## 2019-12-04 DIAGNOSIS — M160 Bilateral primary osteoarthritis of hip: Secondary | ICD-10-CM | POA: Diagnosis not present

## 2019-12-04 DIAGNOSIS — D649 Anemia, unspecified: Secondary | ICD-10-CM | POA: Diagnosis not present

## 2019-12-04 DIAGNOSIS — S22000D Wedge compression fracture of unspecified thoracic vertebra, subsequent encounter for fracture with routine healing: Secondary | ICD-10-CM | POA: Diagnosis not present

## 2019-12-04 DIAGNOSIS — Z7409 Other reduced mobility: Secondary | ICD-10-CM | POA: Diagnosis not present

## 2019-12-04 DIAGNOSIS — R627 Adult failure to thrive: Secondary | ICD-10-CM | POA: Diagnosis not present

## 2019-12-04 DIAGNOSIS — I5033 Acute on chronic diastolic (congestive) heart failure: Secondary | ICD-10-CM | POA: Diagnosis present

## 2019-12-04 DIAGNOSIS — F419 Anxiety disorder, unspecified: Secondary | ICD-10-CM | POA: Diagnosis present

## 2019-12-04 DIAGNOSIS — F319 Bipolar disorder, unspecified: Secondary | ICD-10-CM | POA: Diagnosis not present

## 2019-12-04 DIAGNOSIS — Z6837 Body mass index (BMI) 37.0-37.9, adult: Secondary | ICD-10-CM

## 2019-12-04 DIAGNOSIS — J9601 Acute respiratory failure with hypoxia: Secondary | ICD-10-CM | POA: Diagnosis not present

## 2019-12-04 DIAGNOSIS — J9612 Chronic respiratory failure with hypercapnia: Secondary | ICD-10-CM | POA: Diagnosis not present

## 2019-12-04 DIAGNOSIS — J9602 Acute respiratory failure with hypercapnia: Secondary | ICD-10-CM | POA: Diagnosis not present

## 2019-12-04 DIAGNOSIS — Z20822 Contact with and (suspected) exposure to covid-19: Secondary | ICD-10-CM | POA: Diagnosis present

## 2019-12-04 DIAGNOSIS — J449 Chronic obstructive pulmonary disease, unspecified: Secondary | ICD-10-CM | POA: Diagnosis not present

## 2019-12-04 DIAGNOSIS — F339 Major depressive disorder, recurrent, unspecified: Secondary | ICD-10-CM | POA: Diagnosis not present

## 2019-12-04 DIAGNOSIS — J811 Chronic pulmonary edema: Secondary | ICD-10-CM | POA: Diagnosis not present

## 2019-12-04 DIAGNOSIS — Z7401 Bed confinement status: Secondary | ICD-10-CM

## 2019-12-04 DIAGNOSIS — M6281 Muscle weakness (generalized): Secondary | ICD-10-CM | POA: Diagnosis not present

## 2019-12-04 DIAGNOSIS — Z79899 Other long term (current) drug therapy: Secondary | ICD-10-CM

## 2019-12-04 DIAGNOSIS — J9611 Chronic respiratory failure with hypoxia: Secondary | ICD-10-CM | POA: Diagnosis present

## 2019-12-04 DIAGNOSIS — D519 Vitamin B12 deficiency anemia, unspecified: Secondary | ICD-10-CM | POA: Diagnosis not present

## 2019-12-04 DIAGNOSIS — J9621 Acute and chronic respiratory failure with hypoxia: Secondary | ICD-10-CM | POA: Diagnosis not present

## 2019-12-04 DIAGNOSIS — M545 Low back pain, unspecified: Secondary | ICD-10-CM | POA: Diagnosis not present

## 2019-12-04 DIAGNOSIS — F988 Other specified behavioral and emotional disorders with onset usually occurring in childhood and adolescence: Secondary | ICD-10-CM | POA: Diagnosis not present

## 2019-12-04 DIAGNOSIS — I2699 Other pulmonary embolism without acute cor pulmonale: Secondary | ICD-10-CM | POA: Diagnosis not present

## 2019-12-04 LAB — CBC
HCT: 34.8 % — ABNORMAL LOW (ref 36.0–46.0)
Hemoglobin: 10.5 g/dL — ABNORMAL LOW (ref 12.0–15.0)
MCH: 31.8 pg (ref 26.0–34.0)
MCHC: 30.2 g/dL (ref 30.0–36.0)
MCV: 105.5 fL — ABNORMAL HIGH (ref 80.0–100.0)
Platelets: 246 10*3/uL (ref 150–400)
RBC: 3.3 MIL/uL — ABNORMAL LOW (ref 3.87–5.11)
RDW: 13.5 % (ref 11.5–15.5)
WBC: 8 10*3/uL (ref 4.0–10.5)
nRBC: 0 % (ref 0.0–0.2)

## 2019-12-04 LAB — BASIC METABOLIC PANEL
Anion gap: 8 (ref 5–15)
BUN: 10 mg/dL (ref 8–23)
CO2: 34 mmol/L — ABNORMAL HIGH (ref 22–32)
Calcium: 10.1 mg/dL (ref 8.9–10.3)
Chloride: 97 mmol/L — ABNORMAL LOW (ref 98–111)
Creatinine, Ser: 0.63 mg/dL (ref 0.44–1.00)
GFR, Estimated: >60 mL/min (ref >60)
Glucose, Bld: 95 mg/dL (ref 70–99)
Potassium: 4 mmol/L (ref 3.5–5.1)
Sodium: 139 mmol/L (ref 135–145)

## 2019-12-04 LAB — RESP PANEL BY RT-PCR (FLU A&B, COVID) ARPGX2
Influenza A by PCR: NEGATIVE
Influenza B by PCR: NEGATIVE
SARS Coronavirus 2 by RT PCR: NEGATIVE

## 2019-12-04 LAB — TROPONIN I (HIGH SENSITIVITY)
Troponin I (High Sensitivity): 5 ng/L (ref <18)
Troponin I (High Sensitivity): 5 ng/L (ref <18)

## 2019-12-04 LAB — BLOOD GAS, VENOUS
Acid-Base Excess: 8.1 mmol/L — ABNORMAL HIGH (ref 0.0–2.0)
Bicarbonate: 35.4 mmol/L — ABNORMAL HIGH (ref 20.0–28.0)
O2 Saturation: 70.8 %
Patient temperature: 98.6
pCO2, Ven: 68.3 mmHg — ABNORMAL HIGH (ref 44.0–60.0)
pH, Ven: 7.335 (ref 7.250–7.430)
pO2, Ven: 38.8 mmHg (ref 32.0–45.0)

## 2019-12-04 LAB — BRAIN NATRIURETIC PEPTIDE: B Natriuretic Peptide: 77.3 pg/mL (ref 0.0–100.0)

## 2019-12-04 MED ORDER — OXYCODONE-ACETAMINOPHEN 5-325 MG PO TABS
2.0000 | ORAL_TABLET | Freq: Once | ORAL | Status: AC
  Administered 2019-12-04: 2 via ORAL
  Filled 2019-12-04: qty 2

## 2019-12-04 MED ORDER — FUROSEMIDE 10 MG/ML IJ SOLN
20.0000 mg | Freq: Once | INTRAMUSCULAR | Status: AC
  Administered 2019-12-04: 20 mg via INTRAVENOUS
  Filled 2019-12-04: qty 4

## 2019-12-04 MED ORDER — IPRATROPIUM-ALBUTEROL 0.5-2.5 (3) MG/3ML IN SOLN
3.0000 mL | Freq: Once | RESPIRATORY_TRACT | Status: AC
  Administered 2019-12-04: 3 mL via RESPIRATORY_TRACT
  Filled 2019-12-04: qty 3

## 2019-12-04 MED ORDER — METHYLPREDNISOLONE SODIUM SUCC 125 MG IJ SOLR
125.0000 mg | Freq: Once | INTRAMUSCULAR | Status: AC
  Administered 2019-12-04: 125 mg via INTRAVENOUS
  Filled 2019-12-04: qty 2

## 2019-12-04 NOTE — ED Triage Notes (Signed)
Pt to ER via EMS complaining of SOB.  Pt was in rehab for same since September.  Pt was released home 2 days ago and reports that SOB has since increased.  PT on 4L [place] at home.  Respirations labored on 4L [place].  Rate increased to 6L at this time.

## 2019-12-04 NOTE — ED Provider Notes (Signed)
[facility] DEPT Provider Note   CSN: [id number] Arrival date & time: 12/04/19  1940     History Chief Complaint  Patient presents with  . Shortness of Breath    Julia Daniels is a 69 y.o. female.  HPI Patient medical history significant for COPD and diastolic congestive heart failure.  Patient was just recently in rehabilitation for respiratory failure.  She came home 2 days ago.  She reports since getting home she is gotten increasingly short of breath.  She continues to cough a lot.  No fever.  No chest pain.  She reports she is used her nebulizer several times today without relief.  Reports she is just finished and prednisone taper and was down to 10 mg a day.  Patient does not smoke.  She reports her oxygen tubing at home was really long and she was not sure if that was causing her to not get enough oxygen.  Patient reports that she was discharged from the hospital to rehab she is on 3 L of nasal cannula oxygen.  She reports while she was at rehab she ended up at 4 L.    Past Medical History:  Diagnosis Date  . Anxiety   . Asthma   . COPD (chronic obstructive pulmonary disease) ([place])   . Depression     Patient Active Problem List   Diagnosis Date Noted  . Chronic respiratory failure with hypoxia and hypercapnia ([place]) 11/20/2019  . Respiratory failure with hypoxia ([place]) 11/04/2019  . Pressure injury of skin 10/22/2019  . Chronic diastolic CHF (congestive heart failure) ([place]) 10/22/2019  . Chronic back pain 10/22/2019  . Palliative care by specialist   . Goals of care, counseling/discussion   . DNR (do not resuscitate)   . Acute respiratory failure with hypercapnia ([place]) 09/29/2019  . Drug induced constipation   . Leukocytosis   . Supplemental oxygen dependent   . Anxiety state   . Debility 09/13/2019  . Back pain 09/08/2019  . Essential hypertension 07/27/2019  . Allergic rhinitis 03/14/2018  . Acute respiratory failure with  hypoxia ([place]) 03/08/2018  . Elevated MCV 01/31/2018  . Vitamin B12 deficiency 01/31/2018  . Acute on chronic respiratory failure with hypoxia ([place]) 01/31/2018  . Respiratory failure, acute-on-chronic ([place]) 12/31/2017  . COPD exacerbation ([place]) 09/18/2017  . Hypoxia   . Hyperlipidemia 08/23/2017  . Depression 08/23/2017  . Anxiety 08/23/2017  . Hypercalcemia 08/23/2017  . Thrombocytosis 08/23/2017  . COPD with acute exacerbation ([place]) 08/23/2017  . ADD (attention deficit disorder) 08/30/2012  . Bipolar disorder, unspecified ([place]) 08/30/2012  . COPD  GOLD ? III with increased Eos and reversibility ? ACOS ?  08/30/2012  . Tobacco abuse 08/30/2012    Past Surgical History:  Procedure Laterality Date  . ABDOMINAL HYSTERECTOMY     partial      OB History   No obstetric history on file.     Family History  Problem Relation Age of Onset  . Asthma Mother   . Lung cancer Father   . COPD Father     Social History   Tobacco Use  . Smoking status: Former Smoker    Packs/day: 1.00    Years: 40.00    Pack years: 40.00    Types: Cigarettes    Quit date: 08/23/2017    Years since quitting: 2.2  . Smokeless tobacco: Never Used  Substance Use Topics  . Alcohol use: No  . Drug use: No    Home Medications  Prior to Admission medications   Medication Sig Start Date End Date Taking? Authorizing Provider  albuterol (PROAIR HFA) 108 (90 Base) MCG/ACT inhaler Inhale 2 puffs into the lungs every 6 (six) hours as needed for wheezing or shortness of breath. 07/25/19  Yes Tanda Rockers, MD  albuterol (PROVENTIL) (2.5 MG/3ML) 0.083% nebulizer solution Take 3 mLs (2.5 mg total) by nebulization every 6 (six) hours as needed for wheezing or shortness of breath. 09/10/19  Yes Tanda Rockers, MD  ALPRAZolam Duanne Moron) 0.5 MG tablet Take 1 tablet (0.5 mg total) by mouth 3 (three) times daily as needed for anxiety. 11/10/19  Yes Bonnell Public, MD  apixaban (ELIQUIS) 5 MG TABS tablet Apixaban 10  mg po bid for 3 days, then decrease dose to 5 mg po Bid Patient taking differently: Take 5 mg by mouth 2 (two) times daily.  11/10/19  Yes Dana Allan I, MD  atorvastatin (LIPITOR) 40 MG tablet Take 1 tablet (40 mg total) by mouth daily. Patient taking differently: Take 40 mg by mouth at bedtime.  09/12/19 12/04/19 Yes Florencia Reasons, MD  baclofen (LIORESAL) 10 MG tablet Take 10 mg by mouth 3 (three) times daily.  09/05/19  Yes [provider]  barrier cream (NON-SPECIFIED) CREA Apply 1 application topically daily as needed (apply to rash). 11/10/19  Yes Dana Allan I, MD  budesonide (PULMICORT) 0.5 MG/2ML nebulizer solution Take 2 mLs (0.5 mg total) by nebulization 2 (two) times daily. 11/20/19  Yes Tanda Rockers, MD  famotidine (PEPCID) 20 MG tablet One at bedtime Patient taking differently: Take 20 mg by mouth at bedtime.  07/25/19  Yes Tanda Rockers, MD  furosemide (LASIX) 40 MG tablet Take 40 mg by mouth daily. 12/02/19  Yes [provider]  guaiFENesin (MUCINEX) 600 MG 12 hr tablet Take 1 tablet (600 mg total) by mouth 2 (two) times daily. Patient taking differently: Take 1,200 mg by mouth 2 (two) times daily.  09/12/19  Yes Florencia Reasons, MD  HYDROcodone-acetaminophen (NORCO/VICODIN) 5-325 MG tablet Take 1 tablet by mouth 3 (three) times daily as needed for moderate pain.  12/02/19  Yes [provider]  [facility]) OINT Apply 1 application topically daily as needed (rash).   Yes [provider]  irbesartan (AVAPRO) 150 MG tablet Take 1 tablet (150 mg total) by mouth daily. 11/20/19  Yes Tanda Rockers, MD  lamoTRIgine (LAMICTAL) 200 MG tablet Take 200 mg by mouth at bedtime.    Yes [provider]  lisinopril (ZESTRIL) 40 MG tablet Take 40 mg by mouth daily.  11/29/19  Yes [provider]  loratadine (CLARITIN) 10 MG tablet Take 10 mg by mouth every evening.    Yes [provider]  Methylphenidate HCl ER 72 MG  TBCR Take 72 mg by mouth daily. 11/10/19  Yes Dana Allan I, MD  montelukast (SINGULAIR) 10 MG tablet Take 1 tablet (10 mg total) by mouth at bedtime. 04/17/19  Yes Tanda Rockers, MD  pantoprazole (PROTONIX) 40 MG tablet Take 1 tablet (40 mg total) by mouth daily. Take 30-60 min before first meal of the day 07/25/19  Yes Wert, Christena Deem, MD  potassium chloride (KLOR-CON) 10 MEQ tablet Take 20 mEq by mouth daily. 12/02/19  Yes [provider]  QUEtiapine (SEROQUEL) 100 MG tablet Take 1.5 tablets (150 mg total) by mouth at bedtime. 11/10/19  Yes Dana Allan I, MD  risperiDONE (RISPERDAL) 3 MG tablet Take 1 tablet (3 mg total)  by mouth at bedtime. 11/10/19  Yes Bonnell Public, MD  senna-docusate (SENOKOT-S) 8.6-50 MG tablet Take 1 tablet by mouth 2 (two) times daily. 09/12/19  Yes Florencia Reasons, MD  sodium chloride (OCEAN) 0.65 % SOLN nasal spray Place 1 spray into both nostrils as needed for congestion. 11/10/19  Yes Bonnell Public, MD  SYMBICORT 160-4.5 MCG/ACT inhaler Inhale 2 puffs into the lungs 2 (two) times daily. 11/28/19  Yes [provider]  Tiotropium Bromide-Olodaterol (STIOLTO RESPIMAT) 2.5-2.5 MCG/ACT AERS Inhale 2 puffs into the lungs daily. 11/20/19  Yes Tanda Rockers, MD  venlafaxine XR (EFFEXOR-XR) 150 MG 24 hr capsule Take 150 mg by mouth at bedtime.    Yes [provider]  vitamin B-12 (CYANOCOBALAMIN) 500 MCG tablet Take 1 tablet (500 mcg total) by mouth daily. 11/11/19  Yes Bonnell Public, MD  Vitamin D, Ergocalciferol, (DRISDOL) 50000 units CAPS capsule Take 50,000 Units by mouth every Saturday.  06/18/17  Yes [provider]  lidocaine (LIDODERM) 5 % Place 1 patch onto the skin daily. Remove & Discard patch within 12 hours or as directed by MD Patient not taking: Reported on 12/04/2019 09/12/19   Florencia Reasons, MD  predniSONE (DELTASONE) 10 MG tablet Prednisone 60 mg po daily for 4 days, then 40 mg po daily for 4 days, then 30 mg po  daily for 4 days, then 20 mg po daily for 4 days, then 10 mg po daily for 4 days and stop Patient not taking: Reported on 12/04/2019 11/10/19   Bonnell Public, MD    Allergies    Sulfa antibiotics  Review of Systems   Review of Systems 10 systems reviewed and negative except as per HPI Physical Exam Updated Vital Signs BP 102/82   Pulse 98   Temp 99.3 F (37.4 C) (Oral)   Resp (!) 22   Ht 5\' 3"  (1.6 m)   Wt 95.7 kg   SpO2 94%   BMI 37.38 kg/m   Physical Exam Constitutional:      Comments: Patient is alert.  Mild to moderate increased work of breathing.  Taking a full short sentences.  HENT:     Head: Normocephalic and atraumatic.     Mouth/Throat:     Pharynx: Oropharynx is clear.  Eyes:     Extraocular Movements: Extraocular movements intact.  Cardiovascular:     Comments: Borderline tachycardia no gross rub murmur gallop Pulmonary:     Comments: Moderate increased work of breathing.  Patient speaking in full sentences.  Coarse wheezing throughout lung fields. Abdominal:     General: There is no distension.     Palpations: Abdomen is soft.     Tenderness: There is no abdominal tenderness. There is no guarding.  Musculoskeletal:        General: No tenderness.     Right lower leg: Edema present.     Left lower leg: Edema present.  Skin:    General: Skin is warm and dry.  Neurological:     General: No focal deficit present.     Mental Status: She is oriented to person, place, and time.     Coordination: Coordination normal.  Psychiatric:        Mood and Affect: Mood normal.     ED Results / Procedures / Treatments   Labs (all labs ordered are listed, but only abnormal results are displayed) Labs Reviewed  BASIC METABOLIC PANEL - Abnormal; Notable for the following components:  Result Value   Chloride 97 (*)    CO2 34 (*)    All other components within normal limits  CBC - Abnormal; Notable for the following components:   RBC 3.30 (*)     Hemoglobin 10.5 (*)    HCT 34.8 (*)    MCV 105.5 (*)    All other components within normal limits  RESP PANEL BY RT-PCR (FLU A&B, COVID) ARPGX2  BRAIN NATRIURETIC PEPTIDE  BLOOD GAS, VENOUS  TROPONIN I (HIGH SENSITIVITY)  TROPONIN I (HIGH SENSITIVITY)    EKG EKG Interpretation  Date/Time:  Thursday December 04 2019 19:51:36 EST Ventricular Rate:  97 PR Interval:    QRS Duration: 96 QT Interval:  319 QTC Calculation: 406 R Axis:   78 Text Interpretation: Sinus rhythm no change from previous Confirmed by Charlesetta Shanks [id number]) on 12/04/2019 9:49:14 PM   Radiology DG Chest Port 1 View  Result Date: 12/04/2019 CLINICAL DATA:  Shortness of breath. EXAM: PORTABLE CHEST 1 VIEW COMPARISON:  Chest x-ray 11/04/2019, CT chest 11/04/2019 FINDINGS: The heart size and mediastinal contours are within normal limits. Hilar vasculature prominence. Low lung volumes with bibasilar compressive changes. No pulmonary edema. No pleural effusion. No pneumothorax. No acute osseous abnormality. IMPRESSION: Mild pulmonary vascular congestion. Low lung volumes. Electronically Signed   By: Iven Finn M.D.   On: 12/04/2019 20:53    Procedures Procedures (including critical care time) CRITICAL CARE Performed by: Charlesetta Shanks   Total critical care time: 30 minutes  Critical care time was exclusive of separately billable procedures and treating other patients.  Critical care was necessary to treat or prevent imminent or life-threatening deterioration.  Critical care was time spent personally by me on the following activities: development of treatment plan with patient and/or surrogate as well as nursing, discussions with consultants, evaluation of patient's response to treatment, examination of patient, obtaining history from patient or surrogate, ordering and performing treatments and interventions, ordering and review of laboratory studies, ordering and review of radiographic studies, pulse  oximetry and re-evaluation of patient's condition. Medications Ordered in ED Medications  ipratropium-albuterol (DUONEB) 0.5-2.5 (3) MG/3ML nebulizer solution 3 mL (3 mLs Nebulization Given 12/04/19 2256)  methylPREDNISolone sodium succinate (SOLU-MEDROL) 125 mg/2 mL injection 125 mg (125 mg Intravenous Given 12/04/19 2048)  oxyCODONE-acetaminophen (PERCOCET/ROXICET) 5-325 MG per tablet 2 tablet (2 tablets Oral Given 12/04/19 2049)  furosemide (LASIX) injection 20 mg (20 mg Intravenous Given 12/04/19 2258)    ED Course  I have reviewed the triage vital signs and the nursing notes.  Pertinent labs & imaging results that were available during my care of the patient were reviewed by me and considered in my medical decision making (see chart for details).    MDM Rules/Calculators/A&P                          Consult: Dr. Darnell Level for admission.  Patient presents with increasing shortness of breath after recent rehab for respiratory failure.  Patient reports compliance with medications.  She reports she is taking Lasix at home.  She cannot recall her dose.  At this time, chest x-ray shows mild increased vascular congestion, patient does have peripheral edema symptoms consistent with CHF exacerbation.  Patient also has significant wheezing and describes worsening after tapering off steroids.  I suspect significant component is COPD\asthma patient was given Solu-Medrol and DuoNeb.  She is currently requiring additional oxygen.  Her baseline is reportedly at 4 L.  She is currently  requiring 6 L.  Plan for admission for acute on chronic respiratory failure Final Clinical Impression(s) / ED Diagnoses Final diagnoses:  COPD exacerbation ([place])  Hypoxia  Acute on chronic congestive heart failure, unspecified heart failure type [facility])    Rx / DC Orders ED Discharge Orders    None       Charlesetta Shanks, MD 12/04/19 2321

## 2019-12-04 NOTE — H&P (Addendum)
Julia Daniels:[id number] DOB: 1950-10-07 DOA: 12/04/2019     PCP: Simona Huh, NP   Outpatient Specialists:      Pulmonary   Dr. Melvyn Novas    Patient arrived to ER on 12/04/19 at 1940 Referred by Attending Charlesetta Shanks, MD   Patient coming from: home Lives  With family    Chief Complaint:  Chief Complaint  Patient presents with  . Shortness of Breath    HPI: Julia Daniels is a 69 y.o. female with medical history significant of  COPD chronic respiratory failure on 4L at baseline, diastolic CHF, asthma, morbid obesity, recent PE, back pain and compression fracture    Presented with   worsening shortness of breath Patient has recently been in rehab was home over only for past 2 days Per patient states at first she was doing well but her oxygen tubing at home was very long and she was not getting enough of oxygen she had to turn it up  her shortness of breath has since increased patient is on 4 L at home at baseline she had to increase her oxygen up to 6 L When the mask called the transiently put her on nonrebreather Patient denies any fevers or chills but does report she has had some cough no wheezing she has had some leg edema but skin getting a little better especially since her legs have been wrapped  Patient also endorses that her back pain has been getting worse as well at first was feeling little bit better but she is afraid that she has reinjured it  Reports she has been significantly constipated  Reports onset of the symptoms have been gradually getting worse Known history of significant COPD has been followed by Dr. Annye Rusk On long-term prednisone And on Facsenra injections  Recent admission for PE of the left upper lobe started on Eliquis In combination of COPD exacerbation did well with steroids Discharged to rehab on 1 November  Infectious risk factors:  Reports shortness of breath, dry cough, chest pain,      Has   been vaccinated  against COVID would like to get booster (requesting inpatietn vaccination if able)   Initial COVID TEST  NEGATIVE   Lab Results  Component Value Date   [place] 12/04/2019   [place] NEGATIVE 11/10/2019   [place] NEGATIVE 11/04/2019   [place] NEGATIVE 10/24/2019    Regarding pertinent Chronic problems:     Hyperlipidemia -  on statins Lipitor Lipid Panel     Component Value Date/Time   CHOL 282 (H) 09/12/2019 0448   TRIG 131 09/12/2019 0448   HDL 62 09/12/2019 0448   CHOLHDL 4.5 09/12/2019 0448   VLDL 26 09/12/2019 0448   LDLCALC 194 (H) 09/12/2019 0448   LDLDIRECT 216.9 09/23/2012 1057     HTN on avapro   chronic CHF diastolic  - last echo October 9323 Grade I diastolic On Lasix    obesity-   BMI Readings from Last 1 Encounters:  12/04/19 37.38 kg/m     COPD -  followed by pulmonology    on baseline oxygen  4L,      Recent PE on eliquis    Chronic anemia - baseline hg Hemoglobin & Hematocrit  Recent Labs    11/05/19 0251 11/06/19 0242 12/04/19 2006  HGB 11.7* 11.0* 10.5*    While in ER: Chest x-ray showing fluid congestion  Treated with DuoNeb given a dose of Solu-Medrol and 20 mg of Lasix IV  Hospitalist  was called for admission for acute on chronic respiratory failure  The following Work up has been ordered so far:  Orders Placed This Encounter  Procedures  . Resp Panel by RT-PCR (Flu A&B, Covid) Nasopharyngeal Swab  . DG Chest Port 1 View  . Basic metabolic panel  . CBC  . Brain natriuretic peptide  . Blood gas, venous  . Consult to hospitalist  ALL PATIENTS BEING ADMITTED/HAVING PROCEDURES NEED COVID-19 SCREENING  . EKG 12-Lead  . ED EKG    Following Medications were ordered in ER: Medications  ipratropium-albuterol (DUONEB) 0.5-2.5 (3) MG/3ML nebulizer solution 3 mL (3 mLs Nebulization Given 12/04/19 2256)  methylPREDNISolone sodium succinate (SOLU-MEDROL) 125 mg/2 mL injection 125 mg (125 mg Intravenous Given  12/04/19 2048)  oxyCODONE-acetaminophen (PERCOCET/ROXICET) 5-325 MG per tablet 2 tablet (2 tablets Oral Given 12/04/19 2049)  furosemide (LASIX) injection 20 mg (20 mg Intravenous Given 12/04/19 2258)        Consult Orders  (From admission, onward)         Start     Ordered   12/04/19 2300  Consult to hospitalist  ALL PATIENTS BEING ADMITTED/HAVING PROCEDURES NEED COVID-19 SCREENING  Once       Comments: ALL PATIENTS BEING ADMITTED/HAVING PROCEDURES NEED COVID-19 SCREENING  Provider:  (Not yet assigned)  Question Answer Comment  Place call to: [facility]   Reason for Consult Admit      12/04/19 2259         Significant initial  Findings: Abnormal Labs Reviewed  BASIC METABOLIC PANEL - Abnormal; Notable for the following components:      Result Value   Chloride 97 (*)    CO2 34 (*)    All other components within normal limits  CBC - Abnormal; Notable for the following components:   RBC 3.30 (*)    Hemoglobin 10.5 (*)    HCT 34.8 (*)    MCV 105.5 (*)    All other components within normal limits    Otherwise labs showing:   Recent Labs  Lab 12/04/19 2006  NA 139  K 4.0  CO2 34*  GLUCOSE 95  BUN 10  CREATININE 0.63  CALCIUM 10.1    Cr   Stable,  Lab Results  Component Value Date   CREATININE 0.63 12/04/2019   CREATININE 0.53 11/06/2019   CREATININE 0.57 11/05/2019    No results for input(s): AST, ALT, ALKPHOS, BILITOT, PROT, ALBUMIN in the last 168 hours. Lab Results  Component Value Date   CALCIUM 10.1 12/04/2019   PHOS 4.0 11/05/2019     WBC      Component Value Date/Time   WBC 8.0 12/04/2019 2006   LYMPHSABS 0.8 11/06/2019 0242   MONOABS 0.6 11/06/2019 0242   EOSABS 0.0 11/06/2019 0242   BASOSABS 0.0 11/06/2019 0242   Plt: Lab Results  Component Value Date   PLT 246 12/04/2019       Venous  Blood Gas result:  pH  7.335  PCO2 68.3   ABG    Component Value Date/Time   PHART 7.385 10/21/2019 1945   PCO2ART 56.2 (H) 10/21/2019 1945    PO2ART 79.4 (L) 10/21/2019 1945   HCO3 35.4 (H) 12/04/2019 2305   TCO2 42 (H) 09/28/2019 1158   ACIDBASEDEF 0.6 12/31/2017 1642   O2SAT 70.8 12/04/2019 2305     HG/HCT    Down   from baseline see below    Component Value Date/Time   HGB 10.5 (L) 12/04/2019 2006   HCT  34.8 (L) 12/04/2019 2006   MCV 105.5 (H) 12/04/2019 2006     Troponin 5    ECG: Ordered Personally reviewed by me showing: HR : 97 Rhythm:  NSR    no evidence of ischemic changes QTC 406   BNP (last 3 results) Recent Labs    10/21/19 0941 11/04/19 0736 12/04/19 2027  BNP 55.9 45.4 77.3    DM  labs:  HbA1C: Recent Labs    08/28/19 0506  HGBA1C 5.7*       UA not ordered       Ordered    CXR - cHF mild     ED Triage Vitals  Enc Vitals Group     BP 12/04/19 2015 (!) 190/93     Pulse Rate 12/04/19 1951 97     Resp 12/04/19 1951 (!) 36     Temp 12/04/19 1957 99.3 F (37.4 C)     Temp Source 12/04/19 1957 Oral     SpO2 12/04/19 1950 93 %     Weight 12/04/19 1952 211 lb (95.7 kg)     Height 12/04/19 1952 5\' 3"  (1.6 m)     Head Circumference --      Peak Flow --      Pain Score 12/04/19 2049 10     Pain Loc --      Pain Edu? --      Excl. in [place]? --   TMAX(24)@       Latest  Blood pressure 102/82, pulse (!) 103, temperature 99.3 F (37.4 C), temperature source Oral, resp. rate (!) 21, height 5\' 3"  (1.6 m), weight 95.7 kg, SpO2 99 %.     Review of Systems:    Pertinent positives include:   Fatigue shortness of breath at rest.  dyspnea on exertion  non-productive cough, Constitutional:  No weight loss, night sweats, Fevers, chills, weight loss  HEENT:  No headaches, Difficulty swallowing,Tooth/dental problems,Sore throat,  No sneezing, itching, ear ache, nasal congestion, post nasal drip,  Cardio-vascular:  No chest pain, Orthopnea, PND, anasarca, dizziness, palpitations.no Bilateral lower extremity swelling  GI:  No heartburn, indigestion, abdominal pain, nausea, vomiting,  diarrhea, change in bowel habits, loss of appetite, melena, blood in stool, hematemesis Resp:  no , No excess mucus, no productive cough, No  No coughing up of blood.No change in color of mucus.No wheezing. Skin:  no rash or lesions. No jaundice GU:  no dysuria, change in color of urine, no urgency or frequency. No straining to urinate.  No flank pain.  Musculoskeletal:  No joint pain or no joint swelling. No decreased range of motion. No back pain.  Psych:  No change in mood or affect. No depression or anxiety. No memory loss.  Neuro: no localizing neurological complaints, no tingling, no weakness, no double vision, no gait abnormality, no slurred speech, no confusion  All systems reviewed and apart from [place] all are negative  Past Medical History:   Past Medical History:  Diagnosis Date  . Anxiety   . Asthma   . COPD (chronic obstructive pulmonary disease) ([place])   . Depression       Past Surgical History:  Procedure Laterality Date  . ABDOMINAL HYSTERECTOMY     partial     Social History:  Ambulatory  walker        reports that she quit smoking about 2 years ago. Her smoking use included cigarettes. She has a 40.00 pack-year smoking history. She has never used smokeless tobacco. She reports  that she does not drink alcohol and does not use drugs.   Family History:   Family History  Problem Relation Age of Onset  . Asthma Mother   . Lung cancer Father   . COPD Father     Allergies: Allergies  Allergen Reactions  . Sulfa Antibiotics Other (See Comments)    Mouth gets raw     Prior to Admission medications   Medication Sig Start Date End Date Taking? Authorizing Provider  albuterol (PROAIR HFA) 108 (90 Base) MCG/ACT inhaler Inhale 2 puffs into the lungs every 6 (six) hours as needed for wheezing or shortness of breath. 07/25/19  Yes Tanda Rockers, MD  albuterol (PROVENTIL) (2.5 MG/3ML) 0.083% nebulizer solution Take 3 mLs (2.5 mg total) by nebulization every  6 (six) hours as needed for wheezing or shortness of breath. 09/10/19  Yes Tanda Rockers, MD  ALPRAZolam Duanne Moron) 0.5 MG tablet Take 1 tablet (0.5 mg total) by mouth 3 (three) times daily as needed for anxiety. 11/10/19  Yes Bonnell Public, MD  apixaban (ELIQUIS) 5 MG TABS tablet Apixaban 10 mg po bid for 3 days, then decrease dose to 5 mg po Bid Patient taking differently: Take 5 mg by mouth 2 (two) times daily.  11/10/19  Yes Dana Allan I, MD  atorvastatin (LIPITOR) 40 MG tablet Take 1 tablet (40 mg total) by mouth daily. Patient taking differently: Take 40 mg by mouth at bedtime.  09/12/19 12/04/19 Yes Florencia Reasons, MD  baclofen (LIORESAL) 10 MG tablet Take 10 mg by mouth 3 (three) times daily.  09/05/19  Yes [provider]  barrier cream (NON-SPECIFIED) CREA Apply 1 application topically daily as needed (apply to rash). 11/10/19  Yes Dana Allan I, MD  budesonide (PULMICORT) 0.5 MG/2ML nebulizer solution Take 2 mLs (0.5 mg total) by nebulization 2 (two) times daily. 11/20/19  Yes Tanda Rockers, MD  famotidine (PEPCID) 20 MG tablet One at bedtime Patient taking differently: Take 20 mg by mouth at bedtime.  07/25/19  Yes Tanda Rockers, MD  furosemide (LASIX) 40 MG tablet Take 40 mg by mouth daily. 12/02/19  Yes [provider]  guaiFENesin (MUCINEX) 600 MG 12 hr tablet Take 1 tablet (600 mg total) by mouth 2 (two) times daily. Patient taking differently: Take 1,200 mg by mouth 2 (two) times daily.  09/12/19  Yes Florencia Reasons, MD  HYDROcodone-acetaminophen (NORCO/VICODIN) 5-325 MG tablet Take 1 tablet by mouth 3 (three) times daily as needed for moderate pain.  12/02/19  Yes [provider]  [facility]) OINT Apply 1 application topically daily as needed (rash).   Yes [provider]  irbesartan (AVAPRO) 150 MG tablet Take 1 tablet (150 mg total) by mouth daily. 11/20/19  Yes Tanda Rockers, MD  lamoTRIgine (LAMICTAL) 200 MG tablet Take  200 mg by mouth at bedtime.    Yes [provider]  lisinopril (ZESTRIL) 40 MG tablet Take 40 mg by mouth daily.  11/29/19  Yes [provider]  loratadine (CLARITIN) 10 MG tablet Take 10 mg by mouth every evening.    Yes [provider]  Methylphenidate HCl ER 72 MG TBCR Take 72 mg by mouth daily. 11/10/19  Yes Dana Allan I, MD  montelukast (SINGULAIR) 10 MG tablet Take 1 tablet (10 mg total) by mouth at bedtime. 04/17/19  Yes Tanda Rockers, MD  pantoprazole (PROTONIX) 40 MG tablet Take 1 tablet (40 mg total) by mouth daily. Take 30-60 min before  first meal of the day 07/25/19  Yes Wert, Christena Deem, MD  potassium chloride (KLOR-CON) 10 MEQ tablet Take 20 mEq by mouth daily. 12/02/19  Yes [provider]  QUEtiapine (SEROQUEL) 100 MG tablet Take 1.5 tablets (150 mg total) by mouth at bedtime. 11/10/19  Yes Dana Allan I, MD  risperiDONE (RISPERDAL) 3 MG tablet Take 1 tablet (3 mg total) by mouth at bedtime. 11/10/19  Yes Bonnell Public, MD  senna-docusate (SENOKOT-S) 8.6-50 MG tablet Take 1 tablet by mouth 2 (two) times daily. 09/12/19  Yes Florencia Reasons, MD  sodium chloride (OCEAN) 0.65 % SOLN nasal spray Place 1 spray into both nostrils as needed for congestion. 11/10/19  Yes Bonnell Public, MD  SYMBICORT 160-4.5 MCG/ACT inhaler Inhale 2 puffs into the lungs 2 (two) times daily. 11/28/19  Yes [provider]  Tiotropium Bromide-Olodaterol (STIOLTO RESPIMAT) 2.5-2.5 MCG/ACT AERS Inhale 2 puffs into the lungs daily. 11/20/19  Yes Tanda Rockers, MD  venlafaxine XR (EFFEXOR-XR) 150 MG 24 hr capsule Take 150 mg by mouth at bedtime.    Yes [provider]  vitamin B-12 (CYANOCOBALAMIN) 500 MCG tablet Take 1 tablet (500 mcg total) by mouth daily. 11/11/19  Yes Bonnell Public, MD  Vitamin D, Ergocalciferol, (DRISDOL) 50000 units CAPS capsule Take 50,000 Units by mouth every Saturday.  06/18/17  Yes [provider]  lidocaine  (LIDODERM) 5 % Place 1 patch onto the skin daily. Remove & Discard patch within 12 hours or as directed by MD Patient not taking: Reported on 12/04/2019 09/12/19   Florencia Reasons, MD  predniSONE (DELTASONE) 10 MG tablet Prednisone 60 mg po daily for 4 days, then 40 mg po daily for 4 days, then 30 mg po daily for 4 days, then 20 mg po daily for 4 days, then 10 mg po daily for 4 days and stop Patient not taking: Reported on 12/04/2019 11/10/19   Bonnell Public, MD   Physical Exam: Vitals with BMI 12/04/2019 12/04/2019 12/04/2019  Height - - -  Weight - - -  BMI - - -  Systolic - 536 644  Diastolic - 82 034  Pulse 742 98 108   1. General:  in No Acute distress   Chronically ill -appearing 2. Psychological: Alert and  Oriented 3. Head/ENT:   Moist   Mucous Membranes                          Head Non traumatic, neck supple                           Poor Dentition 4. SKIN: normal   Skin turgor,  Skin clean Dry and intact lower extremities wrapped 5. Heart: Regular rate and rhythm no Murmur, no Rub or gallop 6. Lungs: no wheezes or crackles   7. Abdomen: Soft, non-tender, some hepatomegaly noted distended obese bowel sounds present 8. Lower extremities: no clubbing, cyanosis, trace edema 9. Neurologically Grossly intact, moving all 4 extremities equally  10. MSK: Normal range of motion   All other LABS:     Recent Labs  Lab 12/04/19 2006  WBC 8.0  HGB 10.5*  HCT 34.8*  MCV 105.5*  PLT 246     Recent Labs  Lab 12/04/19 2006  NA 139  K 4.0  CL 97*  CO2 34*  GLUCOSE 95  BUN 10  CREATININE 0.63  CALCIUM 10.1  No results for input(s): AST, ALT, ALKPHOS, BILITOT, PROT, ALBUMIN in the last 168 hours.     Cultures:    Component Value Date/Time   SDES  08/23/2017 1206    BLOOD LEFT ANTECUBITAL Performed at [facility], [place] [address] [place], [place] [address]    SDES  08/23/2017 1206    BLOOD BLOOD LEFT FOREARM Performed at [facility], [place] [address], [place], [place] [address]    SPECREQUEST  08/23/2017 1206    BOTTLES DRAWN AEROBIC AND ANAEROBIC Blood Culture results may not be optimal due to an inadequate volume of blood received in culture bottles Performed at [facility], [place] [address] [place], [place] [address]    SPECREQUEST  08/23/2017 1206    BOTTLES DRAWN AEROBIC AND ANAEROBIC Blood Culture adequate volume Performed at [place] [address], [place], [place] [address]    CULT  08/23/2017 1206    NO GROWTH 5 DAYS Performed at [place] [address] [place], [place] [address]    CULT  08/23/2017 1206    NO GROWTH 5 DAYS Performed at [facility], [place] [address] [place], [place] [address]    REPTSTATUS 08/28/2017 FINAL 08/23/2017 1206   REPTSTATUS 08/28/2017 FINAL 08/23/2017 1206     Radiological Exams on Admission: DG Chest Port 1 View  Result Date: 12/04/2019 CLINICAL DATA:  Shortness of breath. EXAM: PORTABLE CHEST 1 VIEW COMPARISON:  Chest x-ray 11/04/2019, CT chest 11/04/2019 FINDINGS: The heart size and mediastinal contours are within normal limits. Hilar vasculature prominence. Low lung volumes with bibasilar compressive changes. No pulmonary edema. No pleural effusion. No pneumothorax. No acute osseous abnormality. IMPRESSION: Mild pulmonary vascular congestion. Low lung volumes. Electronically Signed   By: Iven Finn M.D.   On: 12/04/2019 20:53    Chart has been reviewed  Assessment/Plan  69 y.o. female with medical history significant of  COPD chronic respiratory failure on 4L at baseline  Admitted for acute on chronic respiratory failure  Present on Admission: . Acute on chronic respiratory failure (HCC) multifactorial most likely secondary to combination of recurrent severe COPD with mild exacerbation versus mild fluid overload secondary to diastolic CHF versus, long tubing at home resulting in  hypoxia Evidence of hypercarbia but asymptomatic well compensated and appears to be chronic Hold off on BiPAP  . COPD with acute exacerbation ([place]) -  Will initiate: Steroid taper   - Albuterol   PRN, - scheduled duoneb,  -  Breo or Dulera at discharge   -  Mucinex.  Titrate O2 to saturation >90%. Follow patients respiratory status.   VBG showing hypercarbia but none symptomatic and well compensated  Currently mentating well no evidence of symptomatic hypercarbia Would benefit from notifying pulmonology in a.m. the patient has been admitted Emailed Dr. Melvyn Novas to let him know  . Acute on chronic diastolic CHF (congestive heart failure) ([place])  -  - admit on telemetry,  cycle cardiac enzymes, Troponin 5   monitor daily weight:  Filed Weights   12/04/19 1952  Weight: 95.7 kg   Last BNP BNP (last 3 results) Recent Labs    10/21/19 0941 11/04/19 0736 12/04/19 2027  BNP 55.9 45.4 77.3   diurese with IV lasix and monitor orthostatics and creatinine to avoid over diuresis. Recent echogram done last month will not repeat showed diastolic CHF   . Bipolar disorder, unspecified ([place]) chronic stable continue home medications    .  Anemia -obtain anemia panel and Hemoccult stool patient is on Eliquis for recent diagnosis of PE   . Hyperlipidemia -chronic stable resume home meds  . Essential hypertension -somewhat soft blood pressures in the emergency department transiently.  Resume home meds when able to tolerate  Other plan as per orders.  Back pain obtain lumbar imaging  Constipation -obtain KUB  Exam showing possible hepatomegaly obtain liver panel may need further work-up of right upper quadrant ultrasound  DVT prophylaxis:  Eliquis     Code Status:    Code Status: Prior DNR/DNI  as per patient   I had personally discussed CODE STATUS with patient     Family Communication:   Family not at  Bedside    Disposition Plan:         To home once workup is complete and patient is  stable   Following barriers for discharge:                            Electrolytes corrected                               Anemia stable                             Pain controlled with PO medications                                                     Would benefit from PT/OT eval prior to DC  Ordered                                      Nutrition    consulted                  Wound care  consulted                    Consults called:  Would likely benefit from pulmonology consult in a.m. or close follow-up after discharge  Admission status:  ED Disposition    ED Disposition Condition [place]: [place] [100102]  Level of Care: Telemetry [5]  Admit to tele based on following criteria: Other see comments  Comments: chf  May admit patient to [facility] or [facility] if equivalent level of care is available:: No  Covid Evaluation: Confirmed COVID Negative  Diagnosis: Acute on chronic respiratory failure [facility]) [[id number]]  Admitting Physician: Toy Baker [[id number]]  Attending Physician: Toy Baker [[id number]]  Estimated length of stay: past midnight tomorrow  Certification:: I certify this patient will need inpatient services for at least 2 midnights        inpatient     I Expect 2 midnight stay secondary to severity of patient's current illness need for inpatient interventions justified by the following:  hemodynamic instability despite optimal treatment (tachycardia  tachypnea  hypoxia, hypercapnia)   and extensive comorbidities including:  Chronic pain  CHF  COPD/asthma  Morbid Obesity .  Chronic anticoagulation  That are currently affecting medical management.   I expect  patient to be hospitalized  for 2 midnights requiring inpatient medical care.  Patient is at high risk for adverse outcome (such as loss of life or disability) if not treated.  Indication for inpatient stay as follows:    Hemodynamic instability  despite maximal medical therapy,    New or worsening hypoxia  Need for IV  diuretics    Level of care    tele  For   24H      Lab Results  Component Value Date   [place] NEGATIVE 12/04/2019     Precautions: admitted as   Covid Negative       PPE: Used by the provider:   P100  eye Goggles,  Gloves     Lora Glomski 12/05/2019, 12:30 AM    [facility]     after 2 AM please page floor coverage PA If 7AM-7PM, please contact the day team taking care of the patient using [email or web address]   Patient was evaluated in the context of the global COVID-19 pandemic, which necessitated consideration that the patient might be at risk for infection with the SARS-CoV-2 virus that causes COVID-19. Institutional protocols and algorithms that pertain to the evaluation of patients at risk for COVID-19 are in a state of rapid change based on information released by regulatory bodies including the CDC and federal and state organizations. These policies and algorithms were followed during the patient's care.

## 2019-12-05 ENCOUNTER — Other Ambulatory Visit: Payer: Self-pay

## 2019-12-05 ENCOUNTER — Inpatient Hospital Stay (HOSPITAL_COMMUNITY): Payer: BC Managed Care – PPO

## 2019-12-05 ENCOUNTER — Encounter (HOSPITAL_COMMUNITY): Payer: Self-pay | Admitting: Internal Medicine

## 2019-12-05 DIAGNOSIS — J9611 Chronic respiratory failure with hypoxia: Secondary | ICD-10-CM

## 2019-12-05 DIAGNOSIS — R627 Adult failure to thrive: Secondary | ICD-10-CM

## 2019-12-05 DIAGNOSIS — D649 Anemia, unspecified: Secondary | ICD-10-CM | POA: Diagnosis present

## 2019-12-05 DIAGNOSIS — J9612 Chronic respiratory failure with hypercapnia: Secondary | ICD-10-CM

## 2019-12-05 LAB — CBC WITH DIFFERENTIAL/PLATELET
Abs Immature Granulocytes: 0.06 10*3/uL (ref 0.00–0.07)
Abs Immature Granulocytes: 0.06 10*3/uL (ref 0.00–0.07)
Basophils Absolute: 0 10*3/uL (ref 0.0–0.1)
Basophils Absolute: 0 10*3/uL (ref 0.0–0.1)
Basophils Relative: 0 %
Basophils Relative: 0 %
Eosinophils Absolute: 0 10*3/uL (ref 0.0–0.5)
Eosinophils Absolute: 0 10*3/uL (ref 0.0–0.5)
Eosinophils Relative: 0 %
Eosinophils Relative: 0 %
HCT: 34.4 % — ABNORMAL LOW (ref 36.0–46.0)
HCT: 33.7 % — ABNORMAL LOW (ref 36.0–46.0)
Hemoglobin: 10.5 g/dL — ABNORMAL LOW (ref 12.0–15.0)
Hemoglobin: 10.2 g/dL — ABNORMAL LOW (ref 12.0–15.0)
Immature Granulocytes: 1 %
Immature Granulocytes: 1 %
Lymphocytes Relative: 4 %
Lymphocytes Relative: 8 %
Lymphs Abs: 0.3 10*3/uL — ABNORMAL LOW (ref 0.7–4.0)
Lymphs Abs: 0.5 10*3/uL — ABNORMAL LOW (ref 0.7–4.0)
MCH: 32.2 pg (ref 26.0–34.0)
MCH: 31.8 pg (ref 26.0–34.0)
MCHC: 30.5 g/dL (ref 30.0–36.0)
MCHC: 30.3 g/dL (ref 30.0–36.0)
MCV: 105.5 fL — ABNORMAL HIGH (ref 80.0–100.0)
MCV: 105 fL — ABNORMAL HIGH (ref 80.0–100.0)
Monocytes Absolute: 0.1 10*3/uL (ref 0.1–1.0)
Monocytes Absolute: 0.1 10*3/uL (ref 0.1–1.0)
Monocytes Relative: 1 %
Monocytes Relative: 2 %
Neutro Abs: 7.6 10*3/uL (ref 1.7–7.7)
Neutro Abs: 5.5 10*3/uL (ref 1.7–7.7)
Neutrophils Relative %: 94 %
Neutrophils Relative %: 89 %
Platelets: 237 10*3/uL (ref 150–400)
Platelets: 241 10*3/uL (ref 150–400)
RBC: 3.26 MIL/uL — ABNORMAL LOW (ref 3.87–5.11)
RBC: 3.21 MIL/uL — ABNORMAL LOW (ref 3.87–5.11)
RDW: 13.2 % (ref 11.5–15.5)
RDW: 13.1 % (ref 11.5–15.5)
WBC: 8.1 10*3/uL (ref 4.0–10.5)
WBC: 6.2 10*3/uL (ref 4.0–10.5)
nRBC: 0 % (ref 0.0–0.2)
nRBC: 0 % (ref 0.0–0.2)

## 2019-12-05 LAB — COMPREHENSIVE METABOLIC PANEL
ALT: 15 U/L (ref 0–44)
AST: 11 U/L — ABNORMAL LOW (ref 15–41)
Albumin: 3.5 g/dL (ref 3.5–5.0)
Alkaline Phosphatase: 67 U/L (ref 38–126)
Anion gap: 9 (ref 5–15)
BUN: 12 mg/dL (ref 8–23)
CO2: 32 mmol/L (ref 22–32)
Calcium: 9.8 mg/dL (ref 8.9–10.3)
Chloride: 96 mmol/L — ABNORMAL LOW (ref 98–111)
Creatinine, Ser: 0.62 mg/dL (ref 0.44–1.00)
GFR, Estimated: >60 mL/min (ref >60)
Glucose, Bld: 133 mg/dL — ABNORMAL HIGH (ref 70–99)
Potassium: 4.2 mmol/L (ref 3.5–5.1)
Sodium: 137 mmol/L (ref 135–145)
Total Bilirubin: 0.6 mg/dL (ref 0.3–1.2)
Total Protein: 6.4 g/dL — ABNORMAL LOW (ref 6.5–8.1)

## 2019-12-05 LAB — IRON AND TIBC
Iron: 39 ug/dL (ref 28–170)
Saturation Ratios: 15 % (ref 10.4–31.8)
TIBC: 262 ug/dL (ref 250–450)
UIBC: 223 ug/dL

## 2019-12-05 LAB — HEPATIC FUNCTION PANEL
ALT: 16 U/L (ref 0–44)
AST: 11 U/L — ABNORMAL LOW (ref 15–41)
Albumin: 3.7 g/dL (ref 3.5–5.0)
Alkaline Phosphatase: 68 U/L (ref 38–126)
Bilirubin, Direct: 0.1 mg/dL (ref 0.0–0.2)
Indirect Bilirubin: 0.6 mg/dL (ref 0.3–0.9)
Total Bilirubin: 0.7 mg/dL (ref 0.3–1.2)
Total Protein: 6.8 g/dL (ref 6.5–8.1)

## 2019-12-05 LAB — RETICULOCYTES
Immature Retic Fract: 12.4 % (ref 2.3–15.9)
RBC.: 3.21 MIL/uL — ABNORMAL LOW (ref 3.87–5.11)
Retic Count, Absolute: 59.4 10*3/uL (ref 19.0–186.0)
Retic Ct Pct: 1.9 % (ref 0.4–3.1)

## 2019-12-05 LAB — FOLATE: Folate: 14.6 ng/mL (ref >5.9)

## 2019-12-05 LAB — PHOSPHORUS: Phosphorus: 3.6 mg/dL (ref 2.5–4.6)

## 2019-12-05 LAB — TSH: TSH: 0.128 u[IU]/mL — ABNORMAL LOW (ref 0.350–4.500)

## 2019-12-05 LAB — VITAMIN B12: Vitamin B-12: 203 pg/mL (ref 180–914)

## 2019-12-05 LAB — FERRITIN: Ferritin: 115 ng/mL (ref 11–307)

## 2019-12-05 LAB — MAGNESIUM: Magnesium: 2.3 mg/dL (ref 1.7–2.4)

## 2019-12-05 MED ORDER — MONTELUKAST SODIUM 10 MG PO TABS
10.0000 mg | ORAL_TABLET | Freq: Every day | ORAL | Status: DC
  Administered 2019-12-05 – 2019-12-12 (×9): 10 mg via ORAL
  Filled 2019-12-05 (×9): qty 1

## 2019-12-05 MED ORDER — SODIUM CHLORIDE 0.9% FLUSH: 3.0000 mL | INTRAVENOUS | Status: DC | PRN

## 2019-12-05 MED ORDER — MOMETASONE FURO-FORMOTEROL FUM 200-5 MCG/ACT IN AERO
2.0000 | INHALATION_SPRAY | Freq: Two times a day (BID) | RESPIRATORY_TRACT | Status: DC
Start: 2019-12-05 — End: 2019-12-05

## 2019-12-05 MED ORDER — ACETAMINOPHEN 325 MG PO TABS
650.0000 mg | ORAL_TABLET | Freq: Four times a day (QID) | ORAL | Status: DC | PRN
  Administered 2019-12-05 – 2019-12-08 (×4): 650 mg via ORAL
  Administered 2019-12-10 – 2019-12-11 (×2): 325 mg via ORAL
  Filled 2019-12-05 (×6): qty 2

## 2019-12-05 MED ORDER — PANTOPRAZOLE SODIUM 40 MG PO TBEC
40.0000 mg | DELAYED_RELEASE_TABLET | Freq: Every day | ORAL | Status: DC
  Administered 2019-12-05 – 2019-12-12 (×8): 40 mg via ORAL
  Filled 2019-12-05 (×8): qty 1

## 2019-12-05 MED ORDER — IPRATROPIUM-ALBUTEROL 0.5-2.5 (3) MG/3ML IN SOLN
3.0000 mL | Freq: Two times a day (BID) | RESPIRATORY_TRACT | Status: DC
  Administered 2019-12-05 – 2019-12-06 (×2): 3 mL via RESPIRATORY_TRACT
  Filled 2019-12-05 (×2): qty 3

## 2019-12-05 MED ORDER — ONDANSETRON HCL 4 MG/2ML IJ SOLN
4.0000 mg | Freq: Four times a day (QID) | INTRAMUSCULAR | Status: DC | PRN
  Administered 2019-12-07: 4 mg via INTRAVENOUS
  Filled 2019-12-05: qty 2

## 2019-12-05 MED ORDER — PREDNISONE 20 MG PO TABS
40.0000 mg | ORAL_TABLET | Freq: Every day | ORAL | Status: DC
  Administered 2019-12-06 – 2019-12-07 (×2): 40 mg via ORAL
  Filled 2019-12-05 (×2): qty 2

## 2019-12-05 MED ORDER — APIXABAN 5 MG PO TABS
5.0000 mg | ORAL_TABLET | Freq: Two times a day (BID) | ORAL | Status: DC
  Administered 2019-12-05 – 2019-12-12 (×17): 5 mg via ORAL
  Filled 2019-12-05 (×17): qty 1

## 2019-12-05 MED ORDER — VENLAFAXINE HCL ER 150 MG PO CP24
150.0000 mg | ORAL_CAPSULE | Freq: Every day | ORAL | Status: DC
  Administered 2019-12-05 – 2019-12-12 (×9): 150 mg via ORAL
  Filled 2019-12-05 (×9): qty 1

## 2019-12-05 MED ORDER — SODIUM CHLORIDE 0.9% FLUSH
3.0000 mL | Freq: Two times a day (BID) | INTRAVENOUS | Status: DC
  Administered 2019-12-05 – 2019-12-12 (×16): 3 mL via INTRAVENOUS

## 2019-12-05 MED ORDER — SODIUM CHLORIDE 0.9 % IV SOLN: 250.0000 mL | INTRAVENOUS | Status: DC | PRN

## 2019-12-05 MED ORDER — BUDESONIDE 0.5 MG/2ML IN SUSP
0.5000 mg | Freq: Two times a day (BID) | RESPIRATORY_TRACT | Status: DC
  Administered 2019-12-05 – 2019-12-12 (×16): 0.5 mg via RESPIRATORY_TRACT
  Filled 2019-12-05 (×16): qty 2

## 2019-12-05 MED ORDER — QUETIAPINE FUMARATE 50 MG PO TABS
150.0000 mg | ORAL_TABLET | Freq: Every day | ORAL | Status: DC
  Administered 2019-12-05 – 2019-12-12 (×9): 150 mg via ORAL
  Filled 2019-12-05: qty 3
  Filled 2019-12-05: qty 1
  Filled 2019-12-05 (×4): qty 3
  Filled 2019-12-05 (×2): qty 1
  Filled 2019-12-05: qty 3

## 2019-12-05 MED ORDER — ALBUTEROL SULFATE (2.5 MG/3ML) 0.083% IN NEBU
2.5000 mg | INHALATION_SOLUTION | RESPIRATORY_TRACT | Status: DC | PRN
  Administered 2019-12-07: 2.5 mg via RESPIRATORY_TRACT
  Filled 2019-12-05: qty 3

## 2019-12-05 MED ORDER — METHYLPREDNISOLONE SODIUM SUCC 40 MG IJ SOLR
40.0000 mg | Freq: Two times a day (BID) | INTRAMUSCULAR | Status: AC
  Administered 2019-12-05 (×2): 40 mg via INTRAVENOUS
  Filled 2019-12-05 (×2): qty 1

## 2019-12-05 MED ORDER — ACETAMINOPHEN 650 MG RE SUPP: 650.0000 mg | Freq: Four times a day (QID) | RECTAL | Status: DC | PRN

## 2019-12-05 MED ORDER — RISPERIDONE 1 MG PO TABS
3.0000 mg | ORAL_TABLET | Freq: Every day | ORAL | Status: DC
  Administered 2019-12-05 – 2019-12-12 (×9): 3 mg via ORAL
  Filled 2019-12-05 (×9): qty 3

## 2019-12-05 MED ORDER — LORATADINE 10 MG PO TABS
10.0000 mg | ORAL_TABLET | Freq: Every evening | ORAL | Status: DC
  Administered 2019-12-05 – 2019-12-12 (×8): 10 mg via ORAL
  Filled 2019-12-05 (×8): qty 1

## 2019-12-05 MED ORDER — HYDROCODONE-ACETAMINOPHEN 5-325 MG PO TABS
1.0000 | ORAL_TABLET | Freq: Three times a day (TID) | ORAL | Status: DC | PRN
  Administered 2019-12-05 – 2019-12-08 (×10): 1 via ORAL
  Filled 2019-12-05 (×11): qty 1

## 2019-12-05 MED ORDER — BACLOFEN 10 MG PO TABS
10.0000 mg | ORAL_TABLET | Freq: Three times a day (TID) | ORAL | Status: DC
  Administered 2019-12-05 – 2019-12-12 (×24): 10 mg via ORAL
  Filled 2019-12-05 (×24): qty 1

## 2019-12-05 MED ORDER — ALPRAZOLAM 0.5 MG PO TABS
0.5000 mg | ORAL_TABLET | Freq: Three times a day (TID) | ORAL | Status: DC | PRN
  Administered 2019-12-05 – 2019-12-12 (×12): 0.5 mg via ORAL
  Filled 2019-12-05 (×12): qty 1

## 2019-12-05 MED ORDER — IPRATROPIUM-ALBUTEROL 0.5-2.5 (3) MG/3ML IN SOLN
3.0000 mL | Freq: Four times a day (QID) | RESPIRATORY_TRACT | Status: DC
  Administered 2019-12-05 (×2): 3 mL via RESPIRATORY_TRACT
  Filled 2019-12-05 (×2): qty 3

## 2019-12-05 MED ORDER — SENNOSIDES-DOCUSATE SODIUM 8.6-50 MG PO TABS
1.0000 | ORAL_TABLET | Freq: Two times a day (BID) | ORAL | Status: DC
  Administered 2019-12-05 – 2019-12-12 (×17): 1 via ORAL
  Filled 2019-12-05 (×17): qty 1

## 2019-12-05 MED ORDER — FUROSEMIDE 10 MG/ML IJ SOLN
40.0000 mg | Freq: Every day | INTRAMUSCULAR | Status: DC
  Administered 2019-12-05 – 2019-12-08 (×4): 40 mg via INTRAVENOUS
  Filled 2019-12-05 (×4): qty 4

## 2019-12-05 MED ORDER — BUDESONIDE 0.5 MG/2ML IN SUSP: 0.5000 mg | Freq: Two times a day (BID) | RESPIRATORY_TRACT | Status: DC

## 2019-12-05 MED ORDER — LAMOTRIGINE 100 MG PO TABS
200.0000 mg | ORAL_TABLET | Freq: Every day | ORAL | Status: DC
  Administered 2019-12-05 – 2019-12-12 (×9): 200 mg via ORAL
  Filled 2019-12-05 (×9): qty 2

## 2019-12-05 MED ORDER — METHYLPHENIDATE HCL ER 18 MG PO TB24
72.0000 mg | ORAL_TABLET | Freq: Every day | ORAL | Status: DC
  Administered 2019-12-05 – 2019-12-12 (×8): 72 mg via ORAL
  Filled 2019-12-05 (×8): qty 4

## 2019-12-05 MED ORDER — BISACODYL 10 MG RE SUPP: 10.0000 mg | Freq: Every day | RECTAL | Status: DC | PRN

## 2019-12-05 MED ORDER — ONDANSETRON HCL 4 MG PO TABS
4.0000 mg | ORAL_TABLET | Freq: Four times a day (QID) | ORAL | Status: DC | PRN
  Administered 2019-12-08: 03:00:00 4 mg via ORAL
  Filled 2019-12-05: qty 1

## 2019-12-05 MED ORDER — FAMOTIDINE 20 MG PO TABS
20.0000 mg | ORAL_TABLET | Freq: Every day | ORAL | Status: DC
  Administered 2019-12-05 – 2019-12-12 (×9): 20 mg via ORAL
  Filled 2019-12-05 (×9): qty 1

## 2019-12-05 MED ORDER — POLYETHYLENE GLYCOL 3350 17 G PO PACK
17.0000 g | PACK | Freq: Every day | ORAL | Status: DC
Start: 2019-12-05 — End: 2019-12-05

## 2019-12-05 NOTE — Progress Notes (Signed)
PT Cancellation Note  Patient Details Name: Julia Daniels MRN: [id number] DOB: 01/20/1950   Cancelled Treatment:    Reason Eval/Treat Not Completed: Pain limiting ability to participate Pt with back pain and limited mobility with OT earlier.  Xrays pending.   Janasia Coverdale,KATHrine E 12/05/2019, 10:56 AM Jannette Spanner PT, DPT Acute Rehabilitation Services Pager: [phone] Office: [phone]

## 2019-12-05 NOTE — ED Notes (Signed)
Report given to [place], Therapist, sports.  Pt SBAR information covered with accepting nurse.  No additional questions at this time.  Pt resting quietly in bed.

## 2019-12-05 NOTE — Progress Notes (Addendum)
PROGRESS NOTE    Julia Daniels  DSK:[id number] DOB: 10-Jun-1950 DOA: 12/04/2019 PCP: Simona Huh, NP    Chief Complaint  Patient presents with  . Shortness of Breath    Brief Narrative:   Julia Daniels is a 69 y.o. female with medical history significant of  COPD chronic respiratory failure on 4L at baseline, diastolic CHF, asthma, morbid obesity, recent PE, back pain and compression fracture Presented with   worsening shortness of breath Patient has recently been in rehab was home over only for past 2 days Per patient states at first she was doing well but her oxygen tubing at home was very long and she was not getting enough of oxygen she had to turn it up  her shortness of breath has since increased patient is on 4 L at home at baseline she had to increase her oxygen up to 6 L Per ED RN"Respirations labored on 4L [place], Rate increased to 6L "  While in ER: Chest x-ray showing fluid congestion  Treated with DuoNeb given a dose of Solu-Medrol and 20 mg of Lasix IV  Hospitalist was called for admission for acute on chronic respiratory failure  Subjective:  1.5 L urine output since admission , reports feeling better, still some edema in bilateral legs and thighs Denies chest pain No bm x3 days   Assessment & Plan:   Active Problems:   Bipolar disorder, unspecified ([place])   Hyperlipidemia   COPD with acute exacerbation ([place])   Essential hypertension   Chronic respiratory failure with hypoxia and hypercapnia (HCC)   Acute on chronic respiratory failure (HCC)   Acute on chronic diastolic CHF (congestive heart failure) (HCC)   Anemia  Acute on chronic respiratory failure,  -Chest x-ray personally reviewed"Mild pulmonary vascular congestion. Low lung volumes." -Likely combination of recurrent severe COPD with exacerbation and acute on chronic diastolic CHF -VBG pCO2 six 8.3 pH 7.335 , likely chronic CO2 retention, PO2 38 -Continue steroid, nebulizer, IV  Lasix, wean oxygen as able  COPD with acute exacerbation Home O2 dependent, on chronic prednisone 10mg  daily at baseline on Fasenra injection every 8 weeks, last dose on November 11 Her COPD maintenance medication was changed to Stiolto 2 puffs every morning and budesonide 0.5 mg per neb twice a day Follows Dr. Morrison Old Currently on IV steroid, nebulizer  Acute on chronic diastolic CHF -Continue IV Lasix, monitor I's and O's   superficial leg wounds, bilateral , likely  Due to edema Wound care input appreciated "Apply small piece of Xeroform gauze Kellie Simmering # [phone])  to right leg wound just below the knee. Cover with 2x2 and wrap both legs from the toes to just below the knees with Ace wrap Kellie Simmering # [id number]). Unwrap both legs daily, assess legs and change xeroform dressing on right side.   PE: Continue Eliquis  Macrocytic anemia Hemoglobin 10.2/MCV 105 We will check iron study B12 folate and reticulocyte count And FOBT  CAD, no chest pain, will verify with patient regarding statin use  HTN; she reports her pulmonologist discontinued lisinopril, changed her to avapro Currently she is on increase dose of lasix, avapro held since admission, restart when able  Compression fracture at T6 Lumbar spine x-ray on admission no acute findings Pain control, back brace   Constipation KUB with possible ileus no other acute findings, she has no nausea no vomiting, denies abdominal pain  mobilize, Senokot twice daily  Class III obesity Body mass index is 37.38 kg/m.   Psych: Per chart  review she has history of bipolar, currently mood stable, pleasant, continue home medication Lamictal methylphenidate, Seroquel, Risperdal, Effexor   FTT; obtain PT eval  DVT prophylaxis: Place TED hose Start: 12/05/19 1212 apixaban (ELIQUIS) tablet 5 mg   Code Status:DNR Family Communication: Patient Disposition:   Status is: Inpatient   Dispo: The patient is from: Home              Anticipated d/c  is to: Likely need skilled nursing facility              Anticipated d/c date is: 24 to 48 hours                Consultants:   None  Procedures:   None  Antimicrobials:   None     Objective: Vitals:   12/05/19 0255 12/05/19 0325 12/05/19 0541 12/05/19 0803  BP:   (!) 142/89   Pulse:   92   Resp:   18   Temp:   97.9 F (36.6 C)   TempSrc:      SpO2: 97% 94% 93% 94%  Weight:      Height:        Intake/Output Summary (Last 24 hours) at 12/05/2019 1219 Last data filed at 12/05/2019 1106 Gross per 24 hour  Intake 240 ml  Output 800 ml  Net -560 ml   Filed Weights   12/04/19 1952  Weight: 95.7 kg    Examination:  General exam: calm, NAD Respiratory system: Currently no wheezing, diminished at bases,. Respiratory effort normal. Cardiovascular system: S1 & S2 heard, RRR. No JVD, no murmur,  Gastrointestinal system: Abdomen is nondistended, soft and nontender. Normal bowel sounds heard. Central nervous system: Alert and oriented. No focal neurological deficits. Extremities: Generalized weakness, edema in bilateral lower extremity up to thigh Skin: No rashes, lesions or ulcers Psychiatry: Judgement and insight appear normal. Mood & affect appropriate.     Data Reviewed: I have personally reviewed following labs and imaging studies  CBC: Recent Labs  Lab 12/04/19 2006 12/05/19 0142 12/05/19 0519  WBC 8.0 8.1 6.2  NEUTROABS  --  7.6 5.5  HGB 10.5* 10.5* 10.2*  HCT 34.8* 34.4* 33.7*  MCV 105.5* 105.5* 105.0*  PLT 246 237 098    Basic Metabolic Panel: Recent Labs  Lab 12/04/19 2006 12/05/19 0519  NA 139 137  K 4.0 4.2  CL 97* 96*  CO2 34* 32  GLUCOSE 95 133*  BUN 10 12  CREATININE 0.63 0.62  CALCIUM 10.1 9.8  MG  --  2.3  PHOS  --  3.6    GFR: Estimated Creatinine Clearance: 75.1 mL/min (by C-G formula based on SCr of 0.62 mg/dL).  Liver Function Tests: Recent Labs  Lab 12/05/19 0142 12/05/19 0519  AST 11* 11*  ALT 16 15  ALKPHOS  68 67  BILITOT 0.7 0.6  PROT 6.8 6.4*  ALBUMIN 3.7 3.5    CBG: No results for input(s): GLUCAP in the last 168 hours.   Recent Results (from the past 240 hour(s))  Resp Panel by RT-PCR (Flu A&B, Covid) Nasopharyngeal Swab     Status: None   Collection Time: 12/04/19  9:20 PM   Specimen: Nasopharyngeal Swab; Nasopharyngeal(NP) swabs in vial transport medium  Result Value Ref Range Status   SARS Coronavirus 2 by RT PCR NEGATIVE NEGATIVE Final    Comment: (NOTE) SARS-CoV-2 target nucleic acids are NOT DETECTED.  The SARS-CoV-2 RNA is generally detectable in upper respiratory specimens during the acute phase  of infection. The lowest concentration of SARS-CoV-2 viral copies this assay can detect is 138 copies/mL. A negative result does not preclude SARS-Cov-2 infection and should not be used as the sole basis for treatment or other patient management decisions. A negative result may occur with  improper specimen collection/handling, submission of specimen other than nasopharyngeal swab, presence of viral mutation(s) within the areas targeted by this assay, and inadequate number of viral copies(<138 copies/mL). A negative result must be combined with clinical observations, patient history, and epidemiological information. The expected result is Negative.  Fact Sheet for Patients:  EntrepreneurPulse.com.au  Fact Sheet for Healthcare Providers:  IncredibleEmployment.be  This test is no t yet approved or cleared by the [place] FDA and  has been authorized for detection and/or diagnosis of SARS-CoV-2 by FDA under an Emergency Use Authorization (EUA). This EUA will remain  in effect (meaning this test can be used) for the duration of the COVID-19 declaration under Section 564(b)(1) of the Act, 21 U.S.C.section 360bbb-3(b)(1), unless the authorization is terminated  or revoked sooner.       Influenza A by PCR NEGATIVE NEGATIVE Final    Influenza B by PCR NEGATIVE NEGATIVE Final    Comment: (NOTE) The Xpert Xpress SARS-CoV-2/FLU/RSV plus assay is intended as an aid in the diagnosis of influenza from Nasopharyngeal swab specimens and should not be used as a sole basis for treatment. Nasal washings and aspirates are unacceptable for Xpert Xpress SARS-CoV-2/FLU/RSV testing.  Fact Sheet for Patients: EntrepreneurPulse.com.au  Fact Sheet for Healthcare Providers: IncredibleEmployment.be  This test is not yet approved or cleared by the [place] FDA and has been authorized for detection and/or diagnosis of SARS-CoV-2 by FDA under an Emergency Use Authorization (EUA). This EUA will remain in effect (meaning this test can be used) for the duration of the COVID-19 declaration under Section 564(b)(1) of the Act, 21 U.S.C. section 360bbb-3(b)(1), unless the authorization is terminated or revoked.  Performed at [facility], [place] [address] [place], [place] [address]          Radiology Studies: DG Lumbar Spine 2-3 Views  Result Date: 12/05/2019 CLINICAL DATA:  Worsening low back pain. EXAM: LUMBAR SPINE - 2-3 VIEW COMPARISON:  CT lumbar spine dated September 08, 2019. FINDINGS: Five lumbar type vertebral bodies. No acute fracture or subluxation. Vertebral body heights are preserved. Alignment is normal. Unchanged moderate to severe disc height loss at L4-L5 and mild disc height loss at L1-L2 and L5-S1. Unchanged advanced lower lumbar facet arthropathy. Mildly dilated loops of small bowel in the central abdomen. Gas and stool throughout the colon. IMPRESSION: 1.  No acute osseous abnormality. 2. Unchanged multilevel lumbar spondylosis as described above, moderate to severe at L4-L5. 3. Mildly dilated loops of small bowel in the central abdomen could reflect ileus. Electronically Signed   By: Titus Dubin M.D.   On: 12/05/2019 11:03   DG Abd 1 View  Result Date:  12/05/2019 CLINICAL DATA:  Abdominal distention EXAM: ABDOMEN - 1 VIEW COMPARISON:  CT 09/08/2019 FINDINGS: Technique is limited due to body habitus. Gas and stool demonstrated throughout the colon with some gas-filled small bowel. No large or small bowel distention. Changes likely represent ileus. No radiopaque stones are identified. Degenerative changes in the spine and hips. IMPRESSION: Nonobstructive bowel gas pattern with probable ileus. Electronically Signed   By: Lucienne Capers M.D.   On: 12/05/2019 00:46   DG Chest Port 1 View  Result Date: 12/04/2019 CLINICAL DATA:  Shortness of  breath. EXAM: PORTABLE CHEST 1 VIEW COMPARISON:  Chest x-ray 11/04/2019, CT chest 11/04/2019 FINDINGS: The heart size and mediastinal contours are within normal limits. Hilar vasculature prominence. Low lung volumes with bibasilar compressive changes. No pulmonary edema. No pleural effusion. No pneumothorax. No acute osseous abnormality. IMPRESSION: Mild pulmonary vascular congestion. Low lung volumes. Electronically Signed   By: Iven Finn M.D.   On: 12/04/2019 20:53        Scheduled Meds: . apixaban  5 mg Oral BID  . baclofen  10 mg Oral TID  . budesonide  0.5 mg Nebulization BID  . famotidine  20 mg Oral QHS  . furosemide  40 mg Intravenous Daily  . ipratropium-albuterol  3 mL Nebulization BID  . lamoTRIgine  200 mg Oral QHS  . loratadine  10 mg Oral QPM  . methylphenidate  72 mg Oral Daily  . methylPREDNISolone (SOLU-MEDROL) injection  40 mg Intravenous Q12H   Followed by  . [START ON 12/06/2019] predniSONE  40 mg Oral Q breakfast  . montelukast  10 mg Oral QHS  . pantoprazole  40 mg Oral QAC breakfast  . QUEtiapine  150 mg Oral QHS  . risperiDONE  3 mg Oral QHS  . senna-docusate  1 tablet Oral BID  . sodium chloride flush  3 mL Intravenous Q12H  . venlafaxine XR  150 mg Oral QHS   Continuous Infusions: . sodium chloride       LOS: 1 day   Time spent: 33mins Greater than 50% of  this time was spent in counseling, explanation of diagnosis, planning of further management, and coordination of care.  I have personally reviewed and interpreted on  12/05/2019 daily labs, tele strips, imagings as discussed above under date review session and assessment and plans.  I reviewed all nursing notes, pharmacy notes, consultant notes,  vitals, pertinent old records  I have discussed plan of care as described above with RN , patient on 12/05/2019  Voice Recognition /Dragon dictation system was used to create this note, attempts have been made to correct errors. Please contact the author with questions and/or clarifications.   Florencia Reasons, MD PhD FACP [facility]  Available via Epic secure chat 7am-7pm for nonurgent issues Please page for urgent issues To page the attending provider between 7A-7P or the covering provider during after hours 7P-7A, please log into the web site [email or web address] and access using universal [place] password for that web site. If you do not have the password, please call the hospital operator.    12/05/2019, 12:19 PM

## 2019-12-05 NOTE — Evaluation (Signed)
Occupational Therapy Evaluation Patient Details Name: Julia Daniels MRN: [id number] DOB: [birth year] Today's Date: 12/05/2019    History of Present Illness Patient is a 69 year old female with history of diastolic congestive heart failure, COPD, asthma, H/O T6 compression fx, treated with TLSO. Patient presented from home with worsening SOB and new onset of significant back pain. Patient has had multiple hospitalizations in past 4 months, reports has been home ~3 weeks from rehab.    Clinical Impression   Patient reports has been home for about 3 weeks from rehab and was mod I with walker up until a few days ago "I don't know what I did" however started having significant onset of back pain. Patient with hx compression fx however patient states "I think I re-injured it." Patient supervision level for rolling in bed, educated patient on benefits of heat therapy and placed hot pack to mid pack. Imaging pending for lumbar spine and with patient in 10/10 pain deferred further mobility. Recommend continued acute OT services in order to maximize patient safety and independence with self care, currently recommending SNF, however pending patient progress and back pain may be able to progress to home.     Follow Up Recommendations  SNF;Supervision/Assistance - 24 hour;Other (comment) (pending progress)    Equipment Recommendations  Other (comment) (LH AE)       Precautions / Restrictions Precautions Precautions: Back Precaution Comments: h/o compression fx; new imaging pending 11/26, son to bring in TLSO brace      Mobility Bed Mobility Overal bed mobility: Needs Assistance Bed Mobility: Rolling Rolling: Supervision         General bed mobility comments: increased time, use of bed rails. unable to assist with scooting up to [facility] requiring total A x2    Transfers                 General transfer comment: deferred d/t back pain        ADL either performed or assessed with  clinical judgement   ADL Overall ADL's : Needs assistance/impaired Eating/Feeding: Set up;Bed level   Grooming: Set up;Bed level   Upper Body Bathing: Moderate assistance;Bed level   Lower Body Bathing: Total assistance;Bed level   Upper Body Dressing : Maximal assistance;Bed level   Lower Body Dressing: Total assistance;Bed level     Toilet Transfer Details (indicate cue type and reason): deferrred d/t back pain; pending updated imaging Toileting- Clothing Manipulation and Hygiene: Total assistance;Bed level         General ADL Comments: patient requiring increased assistance with self care due to significant back pain, limited cardiopulmonary status, decreased activity tolerance                  Pertinent Vitals/Pain Pain Assessment: Faces Faces Pain Scale: Hurts worst Pain Location: mid back Pain Descriptors / Indicators: Aching;Grimacing;Guarding;Moaning Pain Intervention(s): Limited activity within patient's tolerance;Heat applied     Hand Dominance Right   Extremity/Trunk Assessment Upper Extremity Assessment Upper Extremity Assessment: Overall WFL for tasks assessed   Lower Extremity Assessment Lower Extremity Assessment: Defer to PT evaluation       Communication Communication Communication: No difficulties   Cognition Arousal/Alertness: Awake/alert Behavior During Therapy: Restless Overall Cognitive Status: Within Functional Limits for tasks assessed                                 General Comments: patient almost tearful with pain   General Comments  pt on 4L O2            Home Living Family/patient expects to be discharged to:: Private residence Living Arrangements: Children;Other (Comment) (son) Available Help at Discharge: Family;Available PRN/intermittently Type of Home: House Home Access: Stairs to enter [employer] of Steps: 4 Entrance Stairs-Rails: Right Home Layout: One level     Bathroom  Shower/Tub: Corporate investment banker: Standard Bathroom Accessibility: Yes How Accessible: Accessible via walker Home Equipment: [place] - 2 wheels;Bedside commode;Other (comment);Tub bench (bed rails )          Prior Functioning/Environment Level of Independence: Independent with assistive device(s)        Comments: patient D/C from rehab ~3 weeks ago, reports mod I for BADLs until past few days due to back pain "I think I reinjured it"        OT Problem List: Decreased activity tolerance;Pain;Obesity;Cardiopulmonary status limiting activity      OT Treatment/Interventions: Self-care/ADL training;Energy conservation;DME and/or AE instruction;Therapeutic activities;Patient/family education;Balance training    OT Goals(Current goals can be found in the care plan section) Acute Rehab OT Goals Patient Stated Goal: less pain OT Goal Formulation: With patient Time For Goal Achievement: 12/19/19 Potential to Achieve Goals: Good  OT Frequency: Min 2X/week   Barriers to D/C:    patient's son works          AM-PAC OT "6 Clicks" Daily Activity     Outcome Measure Help from another person eating meals?: A Little Help from another person taking care of personal grooming?: A Little Help from another person toileting, which includes using toliet, bedpan, or urinal?: Total Help from another person bathing (including washing, rinsing, drying)?: A Lot Help from another person to put on and taking off regular upper body clothing?: A Lot Help from another person to put on and taking off regular lower body clothing?: Total 6 Click Score: 12   End of Session Equipment Utilized During Treatment: Oxygen Nurse Communication: Mobility status;Other (comment) (heat therapy for back relief)  Activity Tolerance: Patient limited by pain Patient left: in bed;with call bell/phone within reach  OT Visit Diagnosis: Pain;Other abnormalities of gait and mobility (R26.89) Pain -  part of body:  (back)                Time: 8115-7262 OT Time Calculation (min): 22 min Charges:  OT General Charges $OT Visit: 1 Visit OT Evaluation $OT Eval Low Complexity: [place] OT OT pager: [place] 12/05/2019, 10:37 AM

## 2019-12-05 NOTE — Progress Notes (Signed)
Pt. was able to be awoken with some mild-moderate effort, 92% sats, >'d back to upper end of baseline of  2-(4)lpm from progress notes, RN made aware.

## 2019-12-05 NOTE — Progress Notes (Signed)
Pt. was moderately difficult to awaken for scheduled aerosol tx., was snoring with rr-20, known COPD/pC02 retainer, pt. with mild >'d Pc02 on VBG with 97% sats, set 6lpm <'d to 3 lpm, RN made aware.

## 2019-12-05 NOTE — Consult Note (Signed)
WOC Nurse Consult Note:  Patient receiving care in [place] Reason for Consult: Bilateral leg skin breakdown Wound type: RLE scabbed over 0.3 cm x 0.3 cm. Bilateral non-pitting edema, more on the right than left. Patient states she usually has both legs wrapped with Ace bandage.  Pressure Injury POA: NA Drainage (amount, consistency, odor) None Periwound: Dry Dressing procedure/placement/frequency: Apply small piece of Xeroform gauze Kellie Simmering # 294)  to right leg wound just below the knee. Cover with 2x2 and wrap both legs from the toes to just below the knees with Ace wrap Kellie Simmering # [id number]). Unwrap both legs daily, assess legs and change xeroform dressing on right side.   Monitor the wound area(s) for worsening of condition such as: Signs/symptoms of infection, increase in size, development of or worsening of odor, development of pain, or increased pain at the affected locations.   Notify the medical team if any of these develop.  Thank you for the consult. [place] nurse will not follow at this time.   Please re-consult the [place] team if needed.  Cathlean Marseilles Tamala Julian, MSN, RN, [place], [place]le Pearl, [facility] Wound Treatment Associate Pager [phone]

## 2019-12-06 LAB — RETICULOCYTES
Immature Retic Fract: 15.3 % (ref 2.3–15.9)
RBC.: 3.12 MIL/uL — ABNORMAL LOW (ref 3.87–5.11)
Retic Count, Absolute: 67.7 10*3/uL (ref 19.0–186.0)
Retic Ct Pct: 2.2 % (ref 0.4–3.1)

## 2019-12-06 LAB — IRON AND TIBC
Iron: 70 ug/dL (ref 28–170)
Saturation Ratios: 28 % (ref 10.4–31.8)
TIBC: 251 ug/dL (ref 250–450)
UIBC: 181 ug/dL

## 2019-12-06 LAB — FOLATE: Folate: 7.6 ng/mL (ref >5.9)

## 2019-12-06 LAB — VITAMIN B12: Vitamin B-12: 198 pg/mL (ref 180–914)

## 2019-12-06 MED ORDER — IPRATROPIUM-ALBUTEROL 0.5-2.5 (3) MG/3ML IN SOLN
3.0000 mL | Freq: Three times a day (TID) | RESPIRATORY_TRACT | Status: DC
  Administered 2019-12-06 – 2019-12-12 (×20): 3 mL via RESPIRATORY_TRACT
  Filled 2019-12-06 (×20): qty 3

## 2019-12-06 MED ORDER — GUAIFENESIN ER 600 MG PO TB12
600.0000 mg | ORAL_TABLET | Freq: Two times a day (BID) | ORAL | Status: DC
  Administered 2019-12-06 – 2019-12-12 (×14): 600 mg via ORAL
  Filled 2019-12-06 (×13): qty 1

## 2019-12-06 MED ORDER — VITAMIN B-12 1000 MCG PO TABS
1000.0000 ug | ORAL_TABLET | Freq: Every day | ORAL | Status: DC
  Administered 2019-12-07 – 2019-12-12 (×6): 1000 ug via ORAL
  Filled 2019-12-06 (×6): qty 1

## 2019-12-06 NOTE — Evaluation (Signed)
Physical Therapy Evaluation Patient Details Name: Julia Daniels MRN: [id number] DOB: 1950/12/31 Today's Date: 12/06/2019   History of Present Illness  Patient is a 69 year old female with history of diastolic congestive heart failure, COPD, asthma, H/O T6 compression fx, treated with TLSO. Patient presented from home with worsening SOB and new onset of significant back pain. Patient has had multiple hospitalizations in past 4 months, reports has been home ~3 weeks from rehab.   Clinical Impression  Pt admitted with above diagnosis. Pt ambulated 18' with RW and 4L O2, SaO2 94%, 3/4 dyspnea, distance limited by SOB.  Pt currently with functional limitations due to the deficits listed below (see PT Problem List). Pt will benefit from skilled PT to increase their independence and safety with mobility to allow discharge to the venue listed below.       Follow Up Recommendations Home health PT;Supervision for mobility/OOB    Equipment Recommendations  None recommended by PT    Recommendations for Other Services       Precautions / Restrictions Precautions Precautions: Back Precaution Comments: h/o compression fx;  TLSO brace; on 4L home O2 at baseline Required Braces or Orthoses: Spinal Brace Spinal Brace: Thoracolumbosacral orthotic Restrictions Weight Bearing Restrictions: No      Mobility  Bed Mobility Overal bed mobility: Needs Assistance Bed Mobility: Rolling;Sidelying to Sit Rolling: Supervision Sidelying to sit: Supervision;HOB elevated       General bed mobility comments: increased time, use of bed rails.    Transfers Overall transfer level: Needs assistance Equipment used: Rolling walker (2 wheeled) Transfers: Sit to/from Stand Sit to Stand: Supervision         General transfer comment: supervision for safety  Ambulation/Gait Ambulation/Gait assistance: Min guard Gait Distance (Feet): 18 Feet Assistive device: Rolling walker (2 wheeled) Gait  Pattern/deviations: Step-through pattern;Decreased stride length Gait velocity: decr   General Gait Details: steady with RW, ambulated with 4L [facility] O2, SaO2 94%, 3/4 dyspnea, distance limited by SOB  Stairs            Wheelchair Mobility    Modified Rankin (Stroke Patients Only)       Balance Overall balance assessment: Modified Independent                                           Pertinent Vitals/Pain Pain Assessment: 0-10 Pain Score: 8  Pain Location: mid back Pain Descriptors / Indicators: Aching;Grimacing;Guarding;Moaning Pain Intervention(s): Limited activity within patient's tolerance;Monitored during session;Premedicated before session;Repositioned    Home Living Family/patient expects to be discharged to:: Private residence Living Arrangements: Children (son) Available Help at Discharge: Family;Available PRN/intermittently Type of Home: House Home Access: Stairs to enter Entrance Stairs-Rails: Right Entrance Stairs-Number of Steps: 4 Home Layout: One level Home Equipment: Walker - 2 wheels;Bedside commode;Other (comment);Tub bench (bed rails )      Prior Function Level of Independence: Independent with assistive device(s)         Comments: recent DC from [facility] (2 days prior to admission), walks with RW at home, supervision for ADLs, back pain increased upon DC home bc sleeping position     Hand Dominance   Dominant Hand: Right    Extremity/Trunk Assessment   Upper Extremity Assessment Upper Extremity Assessment: Defer to OT evaluation    Lower Extremity Assessment Lower Extremity Assessment: Overall WFL for tasks assessed    Cervical / Trunk Assessment Cervical /  Trunk Assessment: Normal  Communication   Communication: No difficulties  Cognition Arousal/Alertness: Awake/alert Behavior During Therapy: WFL for tasks assessed/performed Overall Cognitive Status: Within Functional Limits for tasks assessed                                         General Comments      Exercises General Exercises - Lower Extremity Ankle Circles/Pumps: AROM;Both;10 reps;Supine Quad Sets: AROM;Both;5 reps;Supine   Assessment/Plan    PT Assessment Patient needs continued PT services  PT Problem List Decreased mobility;Cardiopulmonary status limiting activity;Decreased activity tolerance       PT Treatment Interventions DME instruction;Gait training;Stair training;Functional mobility training;Therapeutic exercise;Therapeutic activities;Patient/family education    PT Goals (Current goals can be found in the Care Plan section)  Acute Rehab PT Goals Patient Stated Goal: less pain PT Goal Formulation: With patient Time For Goal Achievement: 12/20/19 Potential to Achieve Goals: Good    Frequency Min 3X/week   Barriers to discharge        Co-evaluation               AM-PAC PT "6 Clicks" Mobility  Outcome Measure Help needed turning from your back to your side while in a flat bed without using bedrails?: A Little Help needed moving from lying on your back to sitting on the side of a flat bed without using bedrails?: A Little Help needed moving to and from a bed to a chair (including a wheelchair)?: A Little Help needed standing up from a chair using your arms (e.g., wheelchair or bedside chair)?: A Little Help needed to walk in hospital room?: A Little Help needed climbing 3-5 steps with a railing? : A Little 6 Click Score: 18    End of Session Equipment Utilized During Treatment: Oxygen;Back brace Activity Tolerance: Patient limited by fatigue Patient left: in chair;with call bell/phone within reach;with chair alarm set Nurse Communication: Mobility status PT Visit Diagnosis: Difficulty in walking, not elsewhere classified (R26.2);Pain    Time: 0175-1025 PT Time Calculation (min) (ACUTE ONLY): 33 min   Charges:   PT Evaluation $PT Eval Moderate Complexity: 1 Mod PT Treatments $Gait  Training: 8-22 mins        Blondell Reveal Kistler PT 12/06/2019  Acute Rehabilitation Services Pager [phone] Office [phone]

## 2019-12-06 NOTE — Progress Notes (Addendum)
PROGRESS NOTE    Julia Daniels  BJY:[id number] DOB: 05/30/1950 DOA: 12/04/2019 PCP: Simona Huh, NP    Chief Complaint  Patient presents with  . Shortness of Breath    Brief Narrative:   Julia Daniels is a 69 y.o. female with medical history significant of  COPD chronic respiratory failure on 4L at baseline, diastolic CHF, asthma, morbid obesity, recent PE, back pain and compression fracture Presented with   worsening shortness of breath Patient has recently been in rehab was home over only for past 2 days Per patient states at first she was doing well but her oxygen tubing at home was very long and she was not getting enough of oxygen she had to turn it up  her shortness of breath has since increased patient is on 4 L at home at baseline she had to increase her oxygen up to 6 L Per ED RN"Respirations labored on 4L [place], Rate increased to 6L "  While in ER: Chest x-ray showing fluid congestion  Treated with DuoNeb given a dose of Solu-Medrol and 20 mg of Lasix IV  Hospitalist was called for admission for acute on chronic respiratory failure  Subjective:  2.9 L urine output last 24hr , still has congested cough, significant back pain when cough  still some edema in bilateral legs and thighs Denies chest pain Still no bm   Assessment & Plan:   Active Problems:   Bipolar disorder, unspecified ([place])   Hyperlipidemia   COPD with acute exacerbation ([place])   Essential hypertension   Chronic respiratory failure with hypoxia and hypercapnia ([facility])   Acute on chronic respiratory failure ([facility])   Acute on chronic diastolic CHF (congestive heart failure) ([facility])   Anemia  Acute on chronic respiratory failure,  -Chest x-ray personally reviewed"Mild pulmonary vascular congestion. Low lung volumes." -Likely combination of recurrent severe COPD with exacerbation and acute on chronic diastolic CHF -VBG pCO2 six 8.3 pH 7.335 , likely chronic CO2 retention, PO2  38 -Continue steroid, nebulizer, continue  IV Lasix, wean oxygen as able  COPD with acute exacerbation Home O2 dependent, on chronic prednisone 10mg  daily at baseline on Fasenra injection every 8 weeks, last dose on November 11 Her COPD maintenance medication was changed to Stiolto 2 puffs every morning and budesonide 0.5 mg per neb twice a day Follows Dr. Morrison Old Currently on IV steroid, nebulizer  Acute on chronic diastolic CHF -Continue IV Lasix, monitor I's and O's   superficial leg wounds, bilateral , likely  Due to edema Wound care input appreciated "Apply small piece of Xeroform gauze Kellie Simmering # [id number])  to right leg wound just below the knee. Cover with 2x2 and wrap both legs from the toes to just below the knees with Ace wrap Kellie Simmering # [phone]). Unwrap both legs daily, assess legs and change xeroform dressing on right side.   PE: Continue Eliquis  Macrocytic anemia Hemoglobin 10.2/MCV 105 Anemia work-up suggest anemia of chronic disease also has low borderline B12, will start B12 supplement FOBT pending collection  CAD, no chest pain, will verify with patient regarding statin use  HTN; she reports her pulmonologist discontinued lisinopril, changed her to avapro Currently she is on increase dose of lasix, avapro held since admission, restart when able  Compression fracture at T6 Lumbar spine x-ray on admission no acute findings Pain control, back brace   Constipation KUB with possible ileus no other acute findings, she has no nausea no vomiting, denies abdominal pain  mobilize, Senokot twice daily  Class III obesity Body mass index is 37.38 kg/m.  Will benefit from outpatient sleep study  Psych: Per chart review she has history of bipolar, currently mood stable, pleasant, continue home medication Lamictal methylphenidate, Seroquel, Risperdal, Effexor   FTT;  PT eval recommend home health, patient states she is waiting for a hospital bed to be delivered to her home, she  is also asking for a different oxygen tubing  DVT prophylaxis:  apixaban (ELIQUIS) tablet 5 mg   Code Status:DNR Family Communication: Patient Disposition:   Status is: Inpatient   Dispo: The patient is from: Home              Anticipated d/c is to: Home with home health              Anticipated d/c date is: 24 to 48 hours                Consultants:   None  Procedures:   None  Antimicrobials:   None     Objective: Vitals:   12/05/19 2058 12/05/19 2117 12/06/19 0503 12/06/19 0746  BP:  (!) 150/88 116/73   Pulse:   77   Resp:   14   Temp:   98 F (36.7 C)   TempSrc:   Oral   SpO2: 95%  96% 97%  Weight:      Height:        Intake/Output Summary (Last 24 hours) at 12/06/2019 1158 Last data filed at 12/06/2019 1030 Gross per 24 hour  Intake 480 ml  Output 3100 ml  Net -2620 ml   Filed Weights   12/04/19 1952  Weight: 95.7 kg    Examination:  General exam: calm, NAD Respiratory system: Positive rhonchi, diminished at bases,. Respiratory effort normal. Cardiovascular system: S1 & S2 heard, RRR. No JVD, no murmur,  Gastrointestinal system: Abdomen is nondistended, soft and nontender. Normal bowel sounds heard. Central nervous system: Alert and oriented. No focal neurological deficits. Extremities: Generalized weakness, edema in bilateral lower extremity up to thigh Skin: No rashes, lesions or ulcers Psychiatry: Judgement and insight appear normal. Mood & affect appropriate.     Data Reviewed: I have personally reviewed following labs and imaging studies  CBC: Recent Labs  Lab 12/04/19 2006 12/05/19 0142 12/05/19 0519  WBC 8.0 8.1 6.2  NEUTROABS  --  7.6 5.5  HGB 10.5* 10.5* 10.2*  HCT 34.8* 34.4* 33.7*  MCV 105.5* 105.5* 105.0*  PLT 246 237 962    Basic Metabolic Panel: Recent Labs  Lab 12/04/19 2006 12/05/19 0519  NA 139 137  K 4.0 4.2  CL 97* 96*  CO2 34* 32  GLUCOSE 95 133*  BUN 10 12  CREATININE 0.63 0.62  CALCIUM 10.1 9.8   MG  --  2.3  PHOS  --  3.6    GFR: Estimated Creatinine Clearance: 75.1 mL/min (by C-G formula based on SCr of 0.62 mg/dL).  Liver Function Tests: Recent Labs  Lab 12/05/19 0142 12/05/19 0519  AST 11* 11*  ALT 16 15  ALKPHOS 68 67  BILITOT 0.7 0.6  PROT 6.8 6.4*  ALBUMIN 3.7 3.5    CBG: No results for input(s): GLUCAP in the last 168 hours.   Recent Results (from the past 240 hour(s))  Resp Panel by RT-PCR (Flu A&B, Covid) Nasopharyngeal Swab     Status: None   Collection Time: 12/04/19  9:20 PM   Specimen: Nasopharyngeal Swab; Nasopharyngeal(NP) swabs in vial transport medium  Result Value Ref  Range Status   SARS Coronavirus 2 by RT PCR NEGATIVE NEGATIVE Final    Comment: (NOTE) SARS-CoV-2 target nucleic acids are NOT DETECTED.  The SARS-CoV-2 RNA is generally detectable in upper respiratory specimens during the acute phase of infection. The lowest concentration of SARS-CoV-2 viral copies this assay can detect is 138 copies/mL. A negative result does not preclude SARS-Cov-2 infection and should not be used as the sole basis for treatment or other patient management decisions. A negative result may occur with  improper specimen collection/handling, submission of specimen other than nasopharyngeal swab, presence of viral mutation(s) within the areas targeted by this assay, and inadequate number of viral copies(<138 copies/mL). A negative result must be combined with clinical observations, patient history, and epidemiological information. The expected result is Negative.  Fact Sheet for Patients:  EntrepreneurPulse.com.au  Fact Sheet for Healthcare Providers:  IncredibleEmployment.be  This test is no t yet approved or cleared by the [place] FDA and  has been authorized for detection and/or diagnosis of SARS-CoV-2 by FDA under an Emergency Use Authorization (EUA). This EUA will remain  in effect (meaning this test can be  used) for the duration of the COVID-19 declaration under Section 564(b)(1) of the Act, 21 U.S.C.section 360bbb-3(b)(1), unless the authorization is terminated  or revoked sooner.       Influenza A by PCR NEGATIVE NEGATIVE Final   Influenza B by PCR NEGATIVE NEGATIVE Final    Comment: (NOTE) The Xpert Xpress SARS-CoV-2/FLU/RSV plus assay is intended as an aid in the diagnosis of influenza from Nasopharyngeal swab specimens and should not be used as a sole basis for treatment. Nasal washings and aspirates are unacceptable for Xpert Xpress SARS-CoV-2/FLU/RSV testing.  Fact Sheet for Patients: EntrepreneurPulse.com.au  Fact Sheet for Healthcare Providers: IncredibleEmployment.be  This test is not yet approved or cleared by the [place] FDA and has been authorized for detection and/or diagnosis of SARS-CoV-2 by FDA under an Emergency Use Authorization (EUA). This EUA will remain in effect (meaning this test can be used) for the duration of the COVID-19 declaration under Section 564(b)(1) of the Act, 21 U.S.C. section 360bbb-3(b)(1), unless the authorization is terminated or revoked.  Performed at [facility], [place] [address] [place], [place] [address]          Radiology Studies: DG Lumbar Spine 2-3 Views  Result Date: 12/05/2019 CLINICAL DATA:  Worsening low back pain. EXAM: LUMBAR SPINE - 2-3 VIEW COMPARISON:  CT lumbar spine dated September 08, 2019. FINDINGS: Five lumbar type vertebral bodies. No acute fracture or subluxation. Vertebral body heights are preserved. Alignment is normal. Unchanged moderate to severe disc height loss at L4-L5 and mild disc height loss at L1-L2 and L5-S1. Unchanged advanced lower lumbar facet arthropathy. Mildly dilated loops of small bowel in the central abdomen. Gas and stool throughout the colon. IMPRESSION: 1.  No acute osseous abnormality. 2. Unchanged multilevel lumbar spondylosis as  described above, moderate to severe at L4-L5. 3. Mildly dilated loops of small bowel in the central abdomen could reflect ileus. Electronically Signed   By: Titus Dubin M.D.   On: 12/05/2019 11:03   DG Abd 1 View  Result Date: 12/05/2019 CLINICAL DATA:  Abdominal distention EXAM: ABDOMEN - 1 VIEW COMPARISON:  CT 09/08/2019 FINDINGS: Technique is limited due to body habitus. Gas and stool demonstrated throughout the colon with some gas-filled small bowel. No large or small bowel distention. Changes likely represent ileus. No radiopaque stones are identified. Degenerative changes in the spine  and hips. IMPRESSION: Nonobstructive bowel gas pattern with probable ileus. Electronically Signed   By: Lucienne Capers M.D.   On: 12/05/2019 00:46   DG Chest Port 1 View  Result Date: 12/04/2019 CLINICAL DATA:  Shortness of breath. EXAM: PORTABLE CHEST 1 VIEW COMPARISON:  Chest x-ray 11/04/2019, CT chest 11/04/2019 FINDINGS: The heart size and mediastinal contours are within normal limits. Hilar vasculature prominence. Low lung volumes with bibasilar compressive changes. No pulmonary edema. No pleural effusion. No pneumothorax. No acute osseous abnormality. IMPRESSION: Mild pulmonary vascular congestion. Low lung volumes. Electronically Signed   By: Iven Finn M.D.   On: 12/04/2019 20:53        Scheduled Meds: . apixaban  5 mg Oral BID  . baclofen  10 mg Oral TID  . budesonide  0.5 mg Nebulization BID  . famotidine  20 mg Oral QHS  . furosemide  40 mg Intravenous Daily  . ipratropium-albuterol  3 mL Nebulization TID  . lamoTRIgine  200 mg Oral QHS  . loratadine  10 mg Oral QPM  . methylphenidate  72 mg Oral Daily  . montelukast  10 mg Oral QHS  . pantoprazole  40 mg Oral QAC breakfast  . predniSONE  40 mg Oral Q breakfast  . QUEtiapine  150 mg Oral QHS  . risperiDONE  3 mg Oral QHS  . senna-docusate  1 tablet Oral BID  . sodium chloride flush  3 mL Intravenous Q12H  . venlafaxine XR   150 mg Oral QHS   Continuous Infusions: . sodium chloride       LOS: 2 days   Time spent: 41mins Greater than 50% of this time was spent in counseling, explanation of diagnosis, planning of further management, and coordination of care.  I have personally reviewed and interpreted on  12/06/2019 daily labs, tele strips, imagings as discussed above under date review session and assessment and plans.  I reviewed all nursing notes, pharmacy notes, consultant notes,  vitals, pertinent old records  I have discussed plan of care as described above with RN , patient on 12/06/2019  Voice Recognition /Dragon dictation system was used to create this note, attempts have been made to correct errors. Please contact the author with questions and/or clarifications.   Florencia Reasons, MD PhD FACP [facility]  Available via Epic secure chat 7am-7pm for nonurgent issues Please page for urgent issues To page the attending provider between 7A-7P or the covering provider during after hours 7P-7A, please log into the web site [email or web address] and access using universal [place] password for that web site. If you do not have the password, please call the hospital operator.    12/06/2019, 11:58 AM

## 2019-12-06 NOTE — Progress Notes (Signed)
Spoke with MD in regard to ambulating sats. Unable to complete will attempt again in the am.MD agreeable to the plan Will share with next shift coming on as well

## 2019-12-06 NOTE — Progress Notes (Signed)
Pt voided in the bed and was changed and cleaned up at 2300 on 11/27, this caused the patient much discomfort in her back and neck. Patient voided in the bed slightly at 0000 at 1128, she refused to let myself and the tech change the bedpad because she stated that she was in too much discomfort and that her pain had just started subsiding (patient was given PRN pain medication @ 2250) . Patient was educated on skin breakdown and I will re-assess in 1 hr.

## 2019-12-07 LAB — BASIC METABOLIC PANEL
Anion gap: 11 (ref 5–15)
BUN: 17 mg/dL (ref 8–23)
CO2: 33 mmol/L — ABNORMAL HIGH (ref 22–32)
Calcium: 10.4 mg/dL — ABNORMAL HIGH (ref 8.9–10.3)
Chloride: 97 mmol/L — ABNORMAL LOW (ref 98–111)
Creatinine, Ser: 0.6 mg/dL (ref 0.44–1.00)
GFR, Estimated: >60 mL/min (ref >60)
Glucose, Bld: 116 mg/dL — ABNORMAL HIGH (ref 70–99)
Potassium: 3.8 mmol/L (ref 3.5–5.1)
Sodium: 141 mmol/L (ref 135–145)

## 2019-12-07 LAB — CBC WITH DIFFERENTIAL/PLATELET
Abs Immature Granulocytes: 0.11 10*3/uL — ABNORMAL HIGH (ref 0.00–0.07)
Basophils Absolute: 0.1 10*3/uL (ref 0.0–0.1)
Basophils Relative: 0 %
Eosinophils Absolute: 0 10*3/uL (ref 0.0–0.5)
Eosinophils Relative: 0 %
HCT: 41.3 % (ref 36.0–46.0)
Hemoglobin: 12.7 g/dL (ref 12.0–15.0)
Immature Granulocytes: 1 %
Lymphocytes Relative: 13 %
Lymphs Abs: 1.7 10*3/uL (ref 0.7–4.0)
MCH: 31.6 pg (ref 26.0–34.0)
MCHC: 30.8 g/dL (ref 30.0–36.0)
MCV: 102.7 fL — ABNORMAL HIGH (ref 80.0–100.0)
Monocytes Absolute: 1.2 10*3/uL — ABNORMAL HIGH (ref 0.1–1.0)
Monocytes Relative: 9 %
Neutro Abs: 9.6 10*3/uL — ABNORMAL HIGH (ref 1.7–7.7)
Neutrophils Relative %: 77 %
Platelets: 366 10*3/uL (ref 150–400)
RBC: 4.02 MIL/uL (ref 3.87–5.11)
RDW: 13.3 % (ref 11.5–15.5)
WBC: 12.7 10*3/uL — ABNORMAL HIGH (ref 4.0–10.5)
nRBC: 0 % (ref 0.0–0.2)

## 2019-12-07 LAB — MAGNESIUM: Magnesium: 2.3 mg/dL (ref 1.7–2.4)

## 2019-12-07 MED ORDER — PREDNISONE 20 MG PO TABS
20.0000 mg | ORAL_TABLET | Freq: Every day | ORAL | Status: AC
  Administered 2019-12-08 – 2019-12-09 (×2): 20 mg via ORAL
  Filled 2019-12-07 (×2): qty 1

## 2019-12-07 NOTE — Progress Notes (Signed)
Report given to accepting RN on #E. No changes to initial AM assessment

## 2019-12-07 NOTE — Progress Notes (Signed)
PROGRESS NOTE    Julia Daniels  XNA:[id number] DOB: 1950/03/03 DOA: 12/04/2019 PCP: Simona Huh, NP    Chief Complaint  Patient presents with  . Shortness of Breath    Brief Narrative:   Julia Daniels is a 69 y.o. female with medical history significant of  COPD chronic respiratory failure on 4L at baseline, diastolic CHF, asthma, morbid obesity, recent PE, back pain and compression fracture Presented with   worsening shortness of breath Patient has recently been in rehab was home over only for past 2 days Per patient states at first she was doing well but her oxygen tubing at home was very long and she was not getting enough of oxygen she had to turn it up  her shortness of breath has since increased patient is on 4 L at home at baseline she had to increase her oxygen up to 6 L Per ED RN"Respirations labored on 4L [place], Rate increased to 6L "  While in ER: Chest x-ray showing fluid congestion  Treated with DuoNeb given a dose of Solu-Medrol and 20 mg of Lasix IV  Hospitalist was called for admission for acute on chronic respiratory failure  Subjective:  1.6 L urine output last 24hr ,still have cough at night, not much cough during the day  significant back pain when cough  still some edema in bilateral legs and thighs Denies chest pain had bm   Assessment & Plan:   Active Problems:   Bipolar disorder, unspecified ([place])   Hyperlipidemia   COPD with acute exacerbation ([place])   Essential hypertension   Chronic respiratory failure with hypoxia and hypercapnia (HCC)   Acute on chronic respiratory failure (HCC)   Acute on chronic diastolic CHF (congestive heart failure) (HCC)   Anemia  Acute on chronic respiratory failure,  -Chest x-ray personally reviewed"Mild pulmonary vascular congestion. Low lung volumes." -Likely combination of recurrent severe COPD with exacerbation and acute on chronic diastolic CHF -VBG pCO2 six 8.3 pH 7.335 , likely chronic CO2  retention, PO2 38 -Continue steroid, will taper to baseline, nebulizer, continue  IV Lasix, wean oxygen as able  COPD with acute exacerbation Home O2 dependent, on chronic prednisone 10mg  daily at baseline on Fasenra injection every 8 weeks, last dose on November 11 Her COPD maintenance medication was changed to Stiolto 2 puffs every morning and budesonide 0.5 mg per neb twice a day Follows Dr. Morrison Old Currently on IV steroid, nebulizer  Acute on chronic diastolic CHF -Continue IV Lasix, monitor I's and O's -edema improving  superficial leg wounds, bilateral , likely  Due to edema Wound care input appreciated "Apply small piece of Xeroform gauze Kellie Simmering # [id number])  to right leg wound just below the knee. Cover with 2x2 and wrap both legs from the toes to just below the knees with Ace wrap Kellie Simmering # [phone]). Unwrap both legs daily, assess legs and change xeroform dressing on right side.   PE: Continue Eliquis  Macrocytic anemia Hemoglobin 10.2/MCV 105 Anemia work-up suggest anemia of chronic disease also has low borderline B12, will start B12 supplement FOBT pending collection  CAD, no chest pain, will verify with patient regarding statin use  HTN; she reports her pulmonologist discontinued lisinopril, changed her to avapro Currently she is on increase dose of lasix, avapro held since admission, restart when able  Compression fracture at T6 Lumbar spine x-ray on admission no acute findings Pain control, back brace   Constipation KUB with possible ileus no other acute findings, she has no  nausea no vomiting, denies abdominal pain  mobilize, Senokot twice daily Had bm  Class III obesity Body mass index is 35.81 kg/m.  Will benefit from outpatient sleep study  Psych: Per chart review she has history of bipolar, currently mood stable, pleasant, continue home medication Lamictal methylphenidate, Seroquel, Risperdal, Effexor   FTT;  PT eval recommend home health, patient states she  is waiting for a hospital bed to be delivered to her home on monday, she is also asking for a different oxygen tubing  DVT prophylaxis:  apixaban (ELIQUIS) tablet 5 mg   Code Status:DNR Family Communication: Patient Disposition:   Status is: Inpatient   Dispo: The patient is from: Home              Anticipated d/c is to: Home with home health              Anticipated d/c date is: 24 to 48 hours, pending edema improvement, transition from iv lasix to oral lasix                Consultants:   None  Procedures:   None  Antimicrobials:   None     Objective: Vitals:   12/07/19 0600 12/07/19 0634 12/07/19 0745 12/07/19 1314  BP:  (!) 164/106  (!) 100/59  Pulse:  (!) 101  98  Resp:  18  20  Temp:  98.8 F (37.1 C)  97.6 F (36.4 C)  TempSrc:  Oral  Oral  SpO2:  96% 98% 96%  Weight: 91.7 kg     Height:        Intake/Output Summary (Last 24 hours) at 12/07/2019 1321 Last data filed at 12/07/2019 1156 Gross per 24 hour  Intake --  Output 1000 ml  Net -1000 ml   Filed Weights   12/04/19 1952 12/07/19 0600  Weight: 95.7 kg 91.7 kg    Examination:  General exam: calm, NAD Respiratory system: Positive rhonchi, diminished at bases,. Respiratory effort normal. Cardiovascular system: S1 & S2 heard, RRR. No JVD, no murmur,  Gastrointestinal system: Abdomen is nondistended, soft and nontender. Normal bowel sounds heard. Central nervous system: Alert and oriented. No focal neurological deficits. Extremities: Generalized weakness, edema in bilateral lower extremity up to thigh Skin: No rashes, lesions or ulcers Psychiatry: Judgement and insight appear normal. Mood & affect appropriate.     Data Reviewed: I have personally reviewed following labs and imaging studies  CBC: Recent Labs  Lab 12/04/19 2006 12/05/19 0142 12/05/19 0519 12/07/19 0642  WBC 8.0 8.1 6.2 12.7*  NEUTROABS  --  7.6 5.5 9.6*  HGB 10.5* 10.5* 10.2* 12.7  HCT 34.8* 34.4* 33.7* 41.3  MCV  105.5* 105.5* 105.0* 102.7*  PLT 246 237 241 761    Basic Metabolic Panel: Recent Labs  Lab 12/04/19 2006 12/05/19 0519 12/07/19 0642  NA 139 137 141  K 4.0 4.2 3.8  CL 97* 96* 97*  CO2 34* 32 33*  GLUCOSE 95 133* 116*  BUN 10 12 17   CREATININE 0.63 0.62 0.60  CALCIUM 10.1 9.8 10.4*  MG  --  2.3 2.3  PHOS  --  3.6  --     GFR: Estimated Creatinine Clearance: 73.4 mL/min (by C-G formula based on SCr of 0.6 mg/dL).  Liver Function Tests: Recent Labs  Lab 12/05/19 0142 12/05/19 0519  AST 11* 11*  ALT 16 15  ALKPHOS 68 67  BILITOT 0.7 0.6  PROT 6.8 6.4*  ALBUMIN 3.7 3.5    CBG: No  results for input(s): GLUCAP in the last 168 hours.   Recent Results (from the past 240 hour(s))  Resp Panel by RT-PCR (Flu A&B, Covid) Nasopharyngeal Swab     Status: None   Collection Time: 12/04/19  9:20 PM   Specimen: Nasopharyngeal Swab; Nasopharyngeal(NP) swabs in vial transport medium  Result Value Ref Range Status   SARS Coronavirus 2 by RT PCR NEGATIVE NEGATIVE Final    Comment: (NOTE) SARS-CoV-2 target nucleic acids are NOT DETECTED.  The SARS-CoV-2 RNA is generally detectable in upper respiratory specimens during the acute phase of infection. The lowest concentration of SARS-CoV-2 viral copies this assay can detect is 138 copies/mL. A negative result does not preclude SARS-Cov-2 infection and should not be used as the sole basis for treatment or other patient management decisions. A negative result may occur with  improper specimen collection/handling, submission of specimen other than nasopharyngeal swab, presence of viral mutation(s) within the areas targeted by this assay, and inadequate number of viral copies(<138 copies/mL). A negative result must be combined with clinical observations, patient history, and epidemiological information. The expected result is Negative.  Fact Sheet for Patients:  EntrepreneurPulse.com.au  Fact Sheet for Healthcare  Providers:  IncredibleEmployment.be  This test is no t yet approved or cleared by the [place] FDA and  has been authorized for detection and/or diagnosis of SARS-CoV-2 by FDA under an Emergency Use Authorization (EUA). This EUA will remain  in effect (meaning this test can be used) for the duration of the COVID-19 declaration under Section 564(b)(1) of the Act, 21 U.S.C.section 360bbb-3(b)(1), unless the authorization is terminated  or revoked sooner.       Influenza A by PCR NEGATIVE NEGATIVE Final   Influenza B by PCR NEGATIVE NEGATIVE Final    Comment: (NOTE) The Xpert Xpress SARS-CoV-2/FLU/RSV plus assay is intended as an aid in the diagnosis of influenza from Nasopharyngeal swab specimens and should not be used as a sole basis for treatment. Nasal washings and aspirates are unacceptable for Xpert Xpress SARS-CoV-2/FLU/RSV testing.  Fact Sheet for Patients: EntrepreneurPulse.com.au  Fact Sheet for Healthcare Providers: IncredibleEmployment.be  This test is not yet approved or cleared by the [place] FDA and has been authorized for detection and/or diagnosis of SARS-CoV-2 by FDA under an Emergency Use Authorization (EUA). This EUA will remain in effect (meaning this test can be used) for the duration of the COVID-19 declaration under Section 564(b)(1) of the Act, 21 U.S.C. section 360bbb-3(b)(1), unless the authorization is terminated or revoked.  Performed at [facility], [place] [address] [place], [place] [address]          Radiology Studies: No results found.      Scheduled Meds: . apixaban  5 mg Oral BID  . baclofen  10 mg Oral TID  . budesonide  0.5 mg Nebulization BID  . famotidine  20 mg Oral QHS  . furosemide  40 mg Intravenous Daily  . guaiFENesin  600 mg Oral BID  . ipratropium-albuterol  3 mL Nebulization TID  . lamoTRIgine  200 mg Oral QHS  . loratadine  10 mg  Oral QPM  . methylphenidate  72 mg Oral Daily  . montelukast  10 mg Oral QHS  . pantoprazole  40 mg Oral QAC breakfast  . predniSONE  40 mg Oral Q breakfast  . QUEtiapine  150 mg Oral QHS  . risperiDONE  3 mg Oral QHS  . senna-docusate  1 tablet Oral BID  . sodium chloride flush  3 mL Intravenous Q12H  . venlafaxine XR  150 mg Oral QHS  . vitamin B-12  1,000 mcg Oral Daily   Continuous Infusions: . sodium chloride       LOS: 3 days   Time spent: 31mins Greater than 50% of this time was spent in counseling, explanation of diagnosis, planning of further management, and coordination of care.  I have personally reviewed and interpreted on  12/07/2019 daily labs, tele strips, imagings as discussed above under date review session and assessment and plans.  I reviewed all nursing notes, pharmacy notes, consultant notes,  vitals, pertinent old records  I have discussed plan of care as described above with RN , patient on 12/07/2019  Voice Recognition /Dragon dictation system was used to create this note, attempts have been made to correct errors. Please contact the author with questions and/or clarifications.   Florencia Reasons, MD PhD FACP [facility]  Available via Epic secure chat 7am-7pm for nonurgent issues Please page for urgent issues To page the attending provider between 7A-7P or the covering provider during after hours 7P-7A, please log into the web site [email or web address] and access using universal [place] password for that web site. If you do not have the password, please call the hospital operator.    12/07/2019, 1:21 PM

## 2019-12-08 LAB — CBC WITH DIFFERENTIAL/PLATELET
Abs Immature Granulocytes: 0.16 10*3/uL — ABNORMAL HIGH (ref 0.00–0.07)
Basophils Absolute: 0.1 10*3/uL (ref 0.0–0.1)
Basophils Relative: 0 %
Eosinophils Absolute: 0 10*3/uL (ref 0.0–0.5)
Eosinophils Relative: 0 %
HCT: 41.7 % (ref 36.0–46.0)
Hemoglobin: 12.9 g/dL (ref 12.0–15.0)
Immature Granulocytes: 1 %
Lymphocytes Relative: 16 %
Lymphs Abs: 2 10*3/uL (ref 0.7–4.0)
MCH: 31.6 pg (ref 26.0–34.0)
MCHC: 30.9 g/dL (ref 30.0–36.0)
MCV: 102.2 fL — ABNORMAL HIGH (ref 80.0–100.0)
Monocytes Absolute: 1 10*3/uL (ref 0.1–1.0)
Monocytes Relative: 8 %
Neutro Abs: 9.4 10*3/uL — ABNORMAL HIGH (ref 1.7–7.7)
Neutrophils Relative %: 75 %
Platelets: 367 10*3/uL (ref 150–400)
RBC: 4.08 MIL/uL (ref 3.87–5.11)
RDW: 13.2 % (ref 11.5–15.5)
WBC: 12.6 10*3/uL — ABNORMAL HIGH (ref 4.0–10.5)
nRBC: 0 % (ref 0.0–0.2)

## 2019-12-08 LAB — BASIC METABOLIC PANEL
Anion gap: 11 (ref 5–15)
BUN: 17 mg/dL (ref 8–23)
CO2: 31 mmol/L (ref 22–32)
Calcium: 10.5 mg/dL — ABNORMAL HIGH (ref 8.9–10.3)
Chloride: 96 mmol/L — ABNORMAL LOW (ref 98–111)
Creatinine, Ser: 0.64 mg/dL (ref 0.44–1.00)
GFR, Estimated: >60 mL/min (ref >60)
Glucose, Bld: 125 mg/dL — ABNORMAL HIGH (ref 70–99)
Potassium: 3.9 mmol/L (ref 3.5–5.1)
Sodium: 138 mmol/L (ref 135–145)

## 2019-12-08 LAB — MAGNESIUM: Magnesium: 2.2 mg/dL (ref 1.7–2.4)

## 2019-12-08 MED ORDER — SODIUM CHLORIDE 0.9 % IV BOLUS
250.0000 mL | Freq: Once | INTRAVENOUS | Status: AC
  Administered 2019-12-08: 14:00:00 250 mL via INTRAVENOUS

## 2019-12-08 MED ORDER — TRAMADOL HCL 50 MG PO TABS
50.0000 mg | ORAL_TABLET | Freq: Four times a day (QID) | ORAL | Status: DC | PRN
  Administered 2019-12-08 – 2019-12-12 (×12): 50 mg via ORAL
  Filled 2019-12-08 (×13): qty 1

## 2019-12-08 MED ORDER — HYDROCODONE-ACETAMINOPHEN 5-325 MG PO TABS
1.0000 | ORAL_TABLET | Freq: Four times a day (QID) | ORAL | Status: DC | PRN
  Administered 2019-12-08 – 2019-12-09 (×3): 1 via ORAL
  Filled 2019-12-08 (×3): qty 1

## 2019-12-08 NOTE — Progress Notes (Signed)
PROGRESS NOTE    Julia Daniels  WNU:[id number] DOB: Jun 03, 1950 DOA: 12/04/2019 PCP: Simona Huh, NP   Chief Complaint  Patient presents with  . Shortness of Breath  Brief Narrative: 69 year old female with significant history of COPD chronic respiratory failure on 4 L oxygen, diastolic CHF, asthma morbid obesity with BMI 35, recent PE, chronic back pain and compression fracture presented with worsening shortness of breath.  She has been in the rehab and was discharged to home 2 days prior to admission.  She reports living at home was very long and she was not getting enough oxygen and had to turn it up, her shortness of breath has since started to increase and was on 6 L nasal cannula.  She was seen in the ED chest x-ray showed fluid congestion, was given a steroid Solu-Medrol and was admitted.  Subjective: Seen this morning does not feel ready for discharge yet today.  Remains on IV Lasix.  Under [place] low 100 On 4 L nasal cannula.  She reports she has been trying to call the company and expecting her tubing and other supplies to be delivered today  Assessment & Plan:  Acute on chronic hypoxic respiratory failure, on 4 L nasal cannula at home.  BBD with compensated pH, chronic hypercapnia.  Admitted with acute on chronic diastolic CHF, severe COPD with acute exacerbation.  Respiratory status overall improving, remains on IV Lasix we will keep on for next 24 hours.  She will continue on nebulizer bronchodilator, steroid.  Continue supplemental oxygen and incentive spirometry.  Acute COPD with exacerbation continue prednisone taper, baseline on 10 mg.on Fasenra injection every 8 weeks, last dose on November 11. Her COPD maintenance medication was changed to Stiolto 2 puffs every morning and budesonide 0.5 mg per neb twice a day.  She is following Dr. Melvyn Novas from pulmonary.  Continue oral prednisone and taper slowly  Acute on chronic diastolic CHF, diuresing well.  Net -4.9 L, urine  output 1700.  Continue to monitor daily intake output avoid, continue IV Lasix for next 24 hours.  Cad/HTN: BP stable.  Off lisinopril recently and on Avapro.  Continue Lasix lisinopril upon discharge.  Currently no chest pain.  History of PE continue Eliquis  Microcytic anemia hemoglobin overall is stable, likely from anemia of chronic disease with borderline B12 and started on supplementation.  Will advise outpatient FOBT follow-up.  Denies acute blood loss or melena in the stool.  Bipolar disorder, unspecified: on multiple psych medication with lamotrigine, methylphenidate, Risperdal, venlafaxine and Seroquel mood is stable.  Chronic back pain/compression fracture T6, pain is not well controlled she reports.  Increase hydrocodone to every 6-every 8 hours as needed, add tramadol for low pain.  She reports she takes Norco at home.  Constipation KUB with possible ileus but no other acute finding, patient has no other symptoms.  No abdominal pain.  Continue Senokot.  Morbid obesity with BMI 35.  She will benefit with weight loss, outpatient sleep apnea evaluation.  Will advise her to follow-up with Dr. Melvyn Novas  Superficial leg wounds bilaterally likely due to edema, wound care input appreciated.  Nutrition: Diet Order            Diet Heart Room service appropriate? Yes; Fluid consistency: Thin  Diet effective now                 Body mass index is 35.58 kg/m.  Pressure Ulcer: Pressure Injury 10/22/19 Buttocks Medial Stage 2 -  Partial thickness loss of  dermis presenting as a shallow open injury with a red, pink wound bed without slough. (Active)  10/22/19 0224  Location: Buttocks  Location Orientation: Medial  Staging: Stage 2 -  Partial thickness loss of dermis presenting as a shallow open injury with a red, pink wound bed without slough.  Wound Description (Comments):   Present on Admission:      Pressure Injury 12/05/19 Ankle Anterior;Left;Lateral Stage 2 -  Partial thickness  loss of dermis presenting as a shallow open injury with a red, pink wound bed without slough. red non-blanching (Active)  12/05/19 0130  Location: Ankle  Location Orientation: Anterior;Left;Lateral  Staging: Stage 2 -  Partial thickness loss of dermis presenting as a shallow open injury with a red, pink wound bed without slough.  Wound Description (Comments): red non-blanching  Present on Admission: Yes    DVT prophylaxis: ELIQUIS code Status:   Code Status: DNR  Family Communication: plan of care discussed with patient at bedside.  Status is: Inpatient  Remains inpatient appropriate because:Inpatient level of care appropriate due to severity of illness and Ongoing management of CHF.  Acute on chronic back pain.   Dispo: The patient is from: Home              Anticipated d/c is to: Home              Anticipated d/c date is: 1 day              Patient currently is not medically stable to d/c.  Consultants:see note  Procedures:see note  Culture/Microbiology    Component Value Date/Time   SDES  08/23/2017 1206    BLOOD LEFT ANTECUBITAL Performed at [facility], [place] [address] [place], [place] [address]    SDES  08/23/2017 1206    BLOOD BLOOD LEFT FOREARM Performed at [facility], [place] [address] [place], [place] [address]    SPECREQUEST  08/23/2017 1206    BOTTLES DRAWN AEROBIC AND ANAEROBIC Blood Culture results may not be optimal due to an inadequate volume of blood received in culture bottles Performed at [facility], [place] [address] [place], [place] [address]    SPECREQUEST  08/23/2017 1206    BOTTLES DRAWN AEROBIC AND ANAEROBIC Blood Culture adequate volume Performed at [place] [address], [place], [place] [address]    CULT  08/23/2017 1206    NO GROWTH 5 DAYS Performed at [place] [address], [place], [place] [address]    CULT  08/23/2017 1206    NO GROWTH 5  DAYS Performed at [facility], [place] [address], [place], [place] [address]    REPTSTATUS 08/28/2017 FINAL 08/23/2017 1206   REPTSTATUS 08/28/2017 FINAL 08/23/2017 1206    Other culture-see note  Medications: Scheduled Meds: . apixaban  5 mg Oral BID  . baclofen  10 mg Oral TID  . budesonide  0.5 mg Nebulization BID  . famotidine  20 mg Oral QHS  . furosemide  40 mg Intravenous Daily  . guaiFENesin  600 mg Oral BID  . ipratropium-albuterol  3 mL Nebulization TID  . lamoTRIgine  200 mg Oral QHS  . loratadine  10 mg Oral QPM  . methylphenidate  72 mg Oral Daily  . montelukast  10 mg Oral QHS  . pantoprazole  40 mg Oral QAC breakfast  . predniSONE  20 mg Oral Q breakfast  . QUEtiapine  150 mg Oral QHS  .  risperiDONE  3 mg Oral QHS  . senna-docusate  1 tablet Oral BID  . sodium chloride flush  3 mL Intravenous Q12H  . venlafaxine XR  150 mg Oral QHS  . vitamin B-12  1,000 mcg Oral Daily   Continuous Infusions: . sodium chloride      Antimicrobials: Anti-infectives (From admission, onward)   None     Objective: Vitals: Today's Vitals   12/08/19 0836 12/08/19 0927 12/08/19 0957 12/08/19 1052  BP:  (!) 159/85    Pulse:  (!) 105    Resp:  14    Temp:  98 F (36.7 C)    TempSrc:  Oral    SpO2:  92%    Weight:      Height:      PainSc: 9  4  6  6      Intake/Output Summary (Last 24 hours) at 12/08/2019 1148 Last data filed at 12/08/2019 1000 Gross per 24 hour  Intake 600 ml  Output 2200 ml  Net -1600 ml   Filed Weights   12/04/19 1952 12/07/19 0600 12/08/19 0500  Weight: 95.7 kg 91.7 kg 91.1 kg   Weight change: -0.6 kg  Intake/Output from previous day: 11/28 0701 - 11/29 0700 In: -  Out: 1700 [Urine:1700] Intake/Output this shift: Total I/O In: 600 [P.O.:600] Out: 500 [Urine:500]  Examination: General exam: AAOx3, obese,NAD, weak appearing. HEENT:Oral mucosa moist, Ear/Nose WNL grossly,dentition normal. Respiratory system: bilaterally  diminished breath sound, mild basal crackles Cardiovascular system: S1 & S2 +, regular, No JVD. Gastrointestinal system: Abdomen soft, NT,ND, BS+. Nervous System:Alert, awake, moving extremities and grossly nonfocal Extremities: No edema, distal peripheral pulses palpable.  Skin: No rashes,no icterus. MSK: Normal muscle bulk,tone, power  Data Reviewed: I have personally reviewed following labs and imaging studies CBC: Recent Labs  Lab 12/04/19 2006 12/05/19 0142 12/05/19 0519 12/07/19 0642 12/08/19 0523  WBC 8.0 8.1 6.2 12.7* 12.6*  NEUTROABS  --  7.6 5.5 9.6* 9.4*  HGB 10.5* 10.5* 10.2* 12.7 12.9  HCT 34.8* 34.4* 33.7* 41.3 41.7  MCV 105.5* 105.5* 105.0* 102.7* 102.2*  PLT 246 237 241 366 409   Basic Metabolic Panel: Recent Labs  Lab 12/04/19 2006 12/05/19 0519 12/07/19 0642 12/08/19 0523  NA 139 137 141 138  K 4.0 4.2 3.8 3.9  CL 97* 96* 97* 96*  CO2 34* 32 33* 31  GLUCOSE 95 133* 116* 125*  BUN 10 12 17 17   CREATININE 0.63 0.62 0.60 0.64  CALCIUM 10.1 9.8 10.4* 10.5*  MG  --  2.3 2.3 2.2  PHOS  --  3.6  --   --    GFR: Estimated Creatinine Clearance: 73.1 mL/min (by C-G formula based on SCr of 0.64 mg/dL). Liver Function Tests: Recent Labs  Lab 12/05/19 0142 12/05/19 0519  AST 11* 11*  ALT 16 15  ALKPHOS 68 67  BILITOT 0.7 0.6  PROT 6.8 6.4*  ALBUMIN 3.7 3.5   No results for input(s): LIPASE, AMYLASE in the last 168 hours. No results for input(s): AMMONIA in the last 168 hours. Coagulation Profile: No results for input(s): INR, PROTIME in the last 168 hours. Cardiac Enzymes: No results for input(s): CKTOTAL, CKMB, CKMBINDEX, TROPONINI in the last 168 hours. BNP (last 3 results) No results for input(s): PROBNP in the last 8760 hours. HbA1C: No results for input(s): HGBA1C in the last 72 hours. CBG: No results for input(s): GLUCAP in the last 168 hours. Lipid Profile: No results for input(s): CHOL, HDL, LDLCALC, TRIG, CHOLHDL,  LDLDIRECT in the  last 72 hours. Thyroid Function Tests: No results for input(s): TSH, T4TOTAL, FREET4, T3FREE, THYROIDAB in the last 72 hours. Anemia Panel: Recent Labs    12/06/19 0600  VITAMINB12 198  FOLATE 7.6  TIBC 251  IRON 70  RETICCTPCT 2.2   Sepsis Labs: No results for input(s): PROCALCITON, LATICACIDVEN in the last 168 hours.  Recent Results (from the past 240 hour(s))  Resp Panel by RT-PCR (Flu A&B, Covid) Nasopharyngeal Swab     Status: None   Collection Time: 12/04/19  9:20 PM   Specimen: Nasopharyngeal Swab; Nasopharyngeal(NP) swabs in vial transport medium  Result Value Ref Range Status   SARS Coronavirus 2 by RT PCR NEGATIVE NEGATIVE Final    Comment: (NOTE) SARS-CoV-2 target nucleic acids are NOT DETECTED.  The SARS-CoV-2 RNA is generally detectable in upper respiratory specimens during the acute phase of infection. The lowest concentration of SARS-CoV-2 viral copies this assay can detect is 138 copies/mL. A negative result does not preclude SARS-Cov-2 infection and should not be used as the sole basis for treatment or other patient management decisions. A negative result may occur with  improper specimen collection/handling, submission of specimen other than nasopharyngeal swab, presence of viral mutation(s) within the areas targeted by this assay, and inadequate number of viral copies(<138 copies/mL). A negative result must be combined with clinical observations, patient history, and epidemiological information. The expected result is Negative.  Fact Sheet for Patients:  EntrepreneurPulse.com.au  Fact Sheet for Healthcare Providers:  IncredibleEmployment.be  This test is no t yet approved or cleared by the [place] FDA and  has been authorized for detection and/or diagnosis of SARS-CoV-2 by FDA under an Emergency Use Authorization (EUA). This EUA will remain  in effect (meaning this test can be used) for the duration of  the COVID-19 declaration under Section 564(b)(1) of the Act, 21 U.S.C.section 360bbb-3(b)(1), unless the authorization is terminated  or revoked sooner.       Influenza A by PCR NEGATIVE NEGATIVE Final   Influenza B by PCR NEGATIVE NEGATIVE Final    Comment: (NOTE) The Xpert Xpress SARS-CoV-2/FLU/RSV plus assay is intended as an aid in the diagnosis of influenza from Nasopharyngeal swab specimens and should not be used as a sole basis for treatment. Nasal washings and aspirates are unacceptable for Xpert Xpress SARS-CoV-2/FLU/RSV testing.  Fact Sheet for Patients: EntrepreneurPulse.com.au  Fact Sheet for Healthcare Providers: IncredibleEmployment.be  This test is not yet approved or cleared by the [place] FDA and has been authorized for detection and/or diagnosis of SARS-CoV-2 by FDA under an Emergency Use Authorization (EUA). This EUA will remain in effect (meaning this test can be used) for the duration of the COVID-19 declaration under Section 564(b)(1) of the Act, 21 U.S.C. section 360bbb-3(b)(1), unless the authorization is terminated or revoked.  Performed at [facility], [place] [address] [place], [place] [address]      Radiology Studies: No results found.   LOS: 4 days   Antonieta Pert, MD [facility]  12/08/2019, 11:48 AM

## 2019-12-08 NOTE — Progress Notes (Addendum)
Physical Therapy Treatment Patient Details Name: Julia Daniels MRN: [id number] DOB: 26-Nov-1950 Today's Date: 12/08/2019    History of Present Illness Patient is a 69 year old female with history of diastolic congestive heart failure, COPD, asthma, H/O T6 compression fx, treated with TLSO. Patient presented from home with worsening SOB and new onset of significant back pain. Patient has had multiple hospitalizations in past 4 months, reports has been home ~2 days  from rehab.    PT Comments    The patient appears anxious, reports back pain is 8/10, has had pain meds recently, states" The pain meds ar being changed". Patient only transferred to [facility] and back to bed. Did not ambulate. Patient did not don/doff TLSO for [facility] transfers.  Patient was home a few days from Rehab when required to return to [facility],.  Patient also expresses unsurety about  Whether she is to DC home today.Patient is home alone a majority of time while son works/sleeps.   Follow Up Recommendations  Home health PT;Supervision for mobility/OOB     Equipment Recommendations  None recommended by PT (pt reports she is getting a Wc, Unsure if it will fit through doors)    Recommendations for Other Services       Precautions / Restrictions Precautions Precautions: Back;Fall Precaution Comments: h/o compression fx;  TLSO brace; on 4L home O2 at baseline Required Braces or Orthoses: Spinal Brace Spinal Brace: Thoracolumbosacral orthotic Restrictions Weight Bearing Restrictions: No    Mobility  Bed Mobility Overal bed mobility: Needs Assistance Bed Mobility: Supine to Sit;Sit to Supine Rolling: Supervision Sidelying to sit: Supervision;HOB elevated   Sit to supine: Supervision   General bed mobility comments: increased time, use of bed rails.  Transfers Overall transfer level: Needs assistance   Transfers: Sit to/from Stand;Stand Pivot Transfers Sit to Stand: Min assist Stand pivot transfers: Min  assist;Mod assist       General transfer comment: min assist to [facility], then when returning required mod assitance with posterior balance loss and required steady assitance  Ambulation/Gait             General Gait Details: NT as reports in too much pain   Stairs             Wheelchair Mobility    Modified Rankin (Stroke Patients Only)       Balance Overall balance assessment: Needs assistance Sitting-balance support: No upper extremity supported;Feet supported Sitting balance-Leahy Scale: Good     Standing balance support: During functional activity;Single extremity supported Standing balance-Leahy Scale: Poor                              Cognition Arousal/Alertness: Awake/alert Behavior During Therapy: WFL for tasks assessed/performed;Anxious Overall Cognitive Status: Within Functional Limits for tasks assessed                                 General Comments: patient almost tearful with pain, anxious about not knowing if she will Dc today,      Exercises      General Comments        Pertinent Vitals/Pain Faces Pain Scale: Hurts whole lot Pain Location: mid back Pain Descriptors / Indicators: Aching;Grimacing;Guarding;Moaning Pain Intervention(s): Limited activity within patient's tolerance;Monitored during session;Premedicated before session;Heat applied    Home Living  Prior Function            PT Goals (current goals can now be found in the care plan section) Progress towards PT goals: Progressing toward goals    Frequency    Min 3X/week      PT Plan Current plan remains appropriate    Co-evaluation              AM-PAC PT "6 Clicks" Mobility   Outcome Measure  Help needed turning from your back to your side while in a flat bed without using bedrails?: A Little Help needed moving from lying on your back to sitting on the side of a flat bed without using bedrails?: A  Little Help needed moving to and from a bed to a chair (including a wheelchair)?: A Lot Help needed standing up from a chair using your arms (e.g., wheelchair or bedside chair)?: A Lot Help needed to walk in hospital room?: A Lot Help needed climbing 3-5 steps with a railing? : A Lot 6 Click Score: 14    End of Session Equipment Utilized During Treatment: Oxygen Activity Tolerance: Patient limited by pain Patient left: in bed;with call bell/phone within reach;with bed alarm set Nurse Communication: Mobility status PT Visit Diagnosis: Difficulty in walking, not elsewhere classified (R26.2);Pain     Time: 5456-2563 PT Time Calculation (min) (ACUTE ONLY): 22 min  Charges:  $Therapeutic Activity: 8-22 mins                     Tresa Endo PT Acute Rehabilitation Services Pager [phone] Office [phone]    Julia Daniels, Julia Daniels 12/08/2019, 12:16 PM

## 2019-12-09 LAB — CBC
HCT: 39.9 % (ref 36.0–46.0)
Hemoglobin: 12.2 g/dL (ref 12.0–15.0)
MCH: 31.4 pg (ref 26.0–34.0)
MCHC: 30.6 g/dL (ref 30.0–36.0)
MCV: 102.8 fL — ABNORMAL HIGH (ref 80.0–100.0)
Platelets: 321 10*3/uL (ref 150–400)
RBC: 3.88 MIL/uL (ref 3.87–5.11)
RDW: 13.4 % (ref 11.5–15.5)
WBC: 9.1 10*3/uL (ref 4.0–10.5)
nRBC: 0 % (ref 0.0–0.2)

## 2019-12-09 LAB — BASIC METABOLIC PANEL
Anion gap: 8 (ref 5–15)
BUN: 18 mg/dL (ref 8–23)
CO2: 33 mmol/L — ABNORMAL HIGH (ref 22–32)
Calcium: 9.9 mg/dL (ref 8.9–10.3)
Chloride: 97 mmol/L — ABNORMAL LOW (ref 98–111)
Creatinine, Ser: 0.64 mg/dL (ref 0.44–1.00)
GFR, Estimated: >60 mL/min (ref >60)
Glucose, Bld: 100 mg/dL — ABNORMAL HIGH (ref 70–99)
Potassium: 4.2 mmol/L (ref 3.5–5.1)
Sodium: 138 mmol/L (ref 135–145)

## 2019-12-09 MED ORDER — HYDROCODONE-ACETAMINOPHEN 5-325 MG PO TABS
1.0000 | ORAL_TABLET | ORAL | Status: DC | PRN
  Administered 2019-12-09 – 2019-12-12 (×19): 1 via ORAL
  Filled 2019-12-09 (×20): qty 1

## 2019-12-09 MED ORDER — FUROSEMIDE 40 MG PO TABS
40.0000 mg | ORAL_TABLET | Freq: Every day | ORAL | Status: DC
  Administered 2019-12-09 – 2019-12-12 (×4): 40 mg via ORAL
  Filled 2019-12-09 (×4): qty 1

## 2019-12-09 MED ORDER — LIDOCAINE 5 % EX PTCH
1.0000 | MEDICATED_PATCH | CUTANEOUS | Status: DC
  Administered 2019-12-09 – 2019-12-12 (×4): 1 via TRANSDERMAL
  Filled 2019-12-09 (×4): qty 1

## 2019-12-09 NOTE — Progress Notes (Signed)
Transition of Care (TOC) -30 day Note  Patient Details  Name: Julia Daniels  MRN: [id number] Date of Birth: 1950-02-08   To Whom it May Concern: Please be advised that the above patient will require a short-term nursing home stay, anticipated 30 days or less rehabilitation and strengthening. The plan is for return home.

## 2019-12-09 NOTE — Progress Notes (Signed)
Patient pain 10/10. MD contacted d/t no available prn at this time.

## 2019-12-09 NOTE — Progress Notes (Signed)
Physical Therapy Treatment Patient Details Name: Julia Daniels MRN: [id number] DOB: 25-Mar-1950 Today's Date: 12/09/2019    History of Present Illness Patient is a 69 year old female with history of diastolic congestive heart failure, COPD, asthma, H/O T6 compression fx, treated with TLSO. Patient presented from home with worsening SOB and new onset of significant back pain. Patient has had multiple hospitalizations in past 4 months, reports has been home ~2 days  from rehab.    PT Comments    Assisted OOB to [facility] with void urgency.  Pt in bed with pure wick and soaked pad.  General transfer comment: pt required increased assist to rise from bed and complete 1/4 pivot to [facility].  Very unsteady.  Increased back pain.  Increased anxiety.  Pt was able to void and required assist with peri care as pt was unable to self perform in standing while balancing self.  Without notice, B knees buckled and pt sat uncontrolled on BSC.  "my knees gave out".  Required + 2 side by side assist to rise from [facility] and do a quick switch BSC with recliner as pt stood with her knee locked in extension.General Gait Details: unable to attempt ambulation due to pain level and knee instability. Son present during session.   Pt will need ST Rehab at SNF due to mobility decline.  Pt is a HIGH FALL RISK and a HIGH readmit risk if she returns home.    Follow Up Recommendations  SNF     Equipment Recommendations       Recommendations for Other Services       Precautions / Restrictions Precautions Precautions: Back;Fall Precaution Comments: h/o compression fx;  TLSO brace; on 4L home O2 at baseline Required Braces or Orthoses: Spinal Brace Spinal Brace: Thoracolumbosacral orthotic Restrictions Weight Bearing Restrictions: No    Mobility  Bed Mobility Overal bed mobility: Needs Assistance Bed Mobility: Supine to Sit     Supine to sit: Max assist     General bed mobility comments: pt requested increased  assist this session  Transfers Overall transfer level: Needs assistance Equipment used: Rolling walker (2 wheeled);None Transfers: Sit to/from [employer] to Stand: Min assist Stand pivot transfers: Mod assist       General transfer comment: pt required increased assist to rise from bed and complete 1/4 pivot to [facility].  Very unsteady.  Increased back pain.  Increased anxiety.  Pt was able to void and required assist with peri care as pt was unable to self perform in standing while balancing self.  Without notice, B knees buckled and pt sat uncontrolled on BSC.  "my knees gave out".  Required + 2 side by side assist to rise from [facility] and do a quick switch BSC with recliner as pt stood with her knee locked in extension.  Ambulation/Gait             General Gait Details: unable to attempt ambulation due to pain level and knee instability.   Stairs             Wheelchair Mobility    Modified Rankin (Stroke Patients Only)       Balance                                            Cognition Arousal/Alertness: Awake/alert Behavior During Therapy: WFL for tasks assessed/performed;Anxious Overall Cognitive Status: Within  Functional Limits for tasks assessed                                 General Comments: AxO x 3 very pleasant but emotional due to her inability to mobilize and having difficulty controlling her back pain      Exercises      General Comments        Pertinent Vitals/Pain Pain Assessment: 0-10 Pain Score: 8  Pain Location: mid back with activity Pain Descriptors / Indicators: Aching;Grimacing;Guarding;Moaning Pain Intervention(s): Monitored during session;Premedicated before session;Repositioned    Home Living                      Prior Function            PT Goals (current goals can now be found in the care plan section) Progress towards PT goals: Progressing toward goals     Frequency    Min 3X/week      PT Plan Current plan remains appropriate    Co-evaluation              AM-PAC PT "6 Clicks" Mobility   Outcome Measure  Help needed turning from your back to your side while in a flat bed without using bedrails?: A Lot Help needed moving from lying on your back to sitting on the side of a flat bed without using bedrails?: A Lot Help needed moving to and from a bed to a chair (including a wheelchair)?: A Lot Help needed standing up from a chair using your arms (e.g., wheelchair or bedside chair)?: A Lot Help needed to walk in hospital room?: A Lot Help needed climbing 3-5 steps with a railing? : Total 6 Click Score: 11    End of Session Equipment Utilized During Treatment: Oxygen;Gait belt Activity Tolerance: Patient limited by pain;Patient limited by fatigue Patient left: in chair;with call bell/phone within reach;with family/visitor present Nurse Communication: Mobility status PT Visit Diagnosis: Difficulty in walking, not elsewhere classified (R26.2);Pain     Time: 1111-1140 PT Time Calculation (min) (ACUTE ONLY): 29 min  Charges:  $Therapeutic Activity: 23-37 mins                     Rica Koyanagi  PTA Acute  Rehabilitation Services Pager      [phone] Office      [phone]

## 2019-12-09 NOTE — Progress Notes (Signed)
Occupational Therapy Progress Note  Patient tolerate sitting up in chair ~1hr, requesting to get back to bed due to back pain. Patient min A for safety + stability to transfer to bedside commode and total A for peri care after voiding. Patient min A with walker to transfer to EOB with decreased activity/standing tolerance due to back pain. K pad placed in bed for back relief. Continue to recommend SNF due to limited caregiver assist at home (son works a lot) and decreased independence + safety with self care.     12/09/19 1300  OT Visit Information  Last OT Received On 12/09/19  Assistance Needed +2 (safety, LE buckling with PT)  History of Present Illness Patient is a 69 year old female with history of diastolic congestive heart failure, COPD, asthma, H/O T6 compression fx, treated with TLSO. Patient presented from home with worsening SOB and new onset of significant back pain. Patient has had multiple hospitalizations in past 4 months, reports has been home ~2 days  from rehab.  Precautions  Precautions Back;Fall  Precaution Comments h/o compression fx;  TLSO brace; on 4L home O2 at baseline  Required Braces or Orthoses Spinal Brace  Spinal Brace TLSO  Pain Assessment  Pain Assessment Faces  Faces Pain Scale 8  Pain Location mid back with activity  Pain Descriptors / Indicators Aching;Grimacing;Guarding;Moaning  Pain Intervention(s) Monitored during session;Heat applied  Cognition  Arousal/Alertness Awake/alert  Behavior During Therapy WFL for tasks assessed/performed;Anxious  Overall Cognitive Status Within Functional Limits for tasks assessed  ADL  Overall ADL's  Needs assistance/impaired  Toilet Transfer Minimal assistance;Stand-pivot;BSC;RW  Toilet Transfer Details (indicate cue type and reason) min A for safety, decreased activity/standing tolerance due to back pain   Toileting- Clothing Manipulation and Hygiene Total assistance;Sit to/from stand  Toileting - Clothing  Manipulation Details (indicate cue type and reason) increased back pain with any mobility, reliant on B UE support of walker   Functional mobility during ADLs Minimal assistance;Rolling walker  General ADL Comments patient continues to have limited activity tolerance due to significant back pain, TLSO donned for session  Bed Mobility  Overal bed mobility Needs Assistance  Bed Mobility Sit to Supine  Sit to supine Min guard  General bed mobility comments increased time to lift LEs onto EOB  Balance  Overall balance assessment Needs assistance  Sitting-balance support Feet supported  Sitting balance-Leahy Scale Fair  Standing balance support Bilateral upper extremity supported  Standing balance-Leahy Scale Poor  Standing balance comment reliant on external support  Transfers  Overall transfer level Needs assistance  Equipment used Rolling walker (2 wheeled)  Transfers Sit to/from Stand;Stand Pivot Transfers  Sit to Stand Min assist  Stand pivot transfers Min assist  General transfer comment please see toilet transfer in ADL section  OT - End of Session  Equipment Utilized During Treatment Oxygen;Rolling walker;Back brace  Activity Tolerance Patient limited by pain  Patient left in bed;with call bell/phone within reach  Nurse Communication Mobility status  OT Assessment/Plan  OT Plan Discharge plan remains appropriate  OT Visit Diagnosis Pain;Other abnormalities of gait and mobility (R26.89)  Pain - part of body  (back)  OT Frequency (ACUTE ONLY) Min 2X/week  Follow Up Recommendations SNF  OT Equipment Other (comment);Hospital bed;Wheelchair (measurements OT) ([place] [facility])  AM-PAC OT "6 Clicks" Daily Activity Outcome Measure (Version 2)  Help from another person eating meals? 3  Help from another person taking care of personal grooming? 3  Help from another person toileting, which includes using toliet,  bedpan, or urinal? 2  Help from another person bathing (including washing, rinsing,  drying)? 2  Help from another person to put on and taking off regular upper body clothing? 2  Help from another person to put on and taking off regular lower body clothing? 1  6 Click Score 13  OT Goal Progression  Progress towards OT goals Progressing toward goals  Acute Rehab OT Goals  Patient Stated Goal less pain  OT Goal Formulation With patient  Time For Goal Achievement 12/19/19  Potential to Achieve Goals Good  ADL Goals  Pt Will Perform Grooming with set-up;sitting (EOB)  Pt Will Perform Upper Body Dressing with supervision;sitting  Pt Will Perform Lower Body Dressing with min assist;with adaptive equipment;sit to/from stand;sitting/lateral leans  Pt Will Transfer to Toilet with min assist;bedside commode;stand pivot transfer (walker)  OT Time Calculation  OT Start Time (ACUTE ONLY) 1245  OT Stop Time (ACUTE ONLY) 1308  OT Time Calculation (min) 23 min  OT General Charges  $OT Visit 1 Visit  OT Treatments  $Self Care/Home Management  23-37 mins   Delbert Phenix OT OT pager: [phone]

## 2019-12-09 NOTE — NC FL2 (Signed)
[place] LEVEL OF CARE SCREENING TOOL     IDENTIFICATION  Patient Name: Julia Daniels Birthdate: [birth year] Sex: female Admission Date (Current Location): 12/04/2019  [facility] and [place] Number:  Herbalist and Address:  [facility],  [place] [address] [place]      Provider Number: [id number]  Attending Physician Name and Address:  Antonieta Pert, MD  Relative Name and Phone Number:  Inocente Salles [phone]    Current Level of Care: Hospital Recommended Level of Care: [place] Prior Approval Number:    Date Approved/Denied:   PASRR Number:    Discharge Plan: SNF    Current Diagnoses: Patient Active Problem List   Diagnosis Date Noted  . Anemia 12/05/2019  . Acute on chronic respiratory failure ([place]) 12/04/2019  . Acute on chronic diastolic CHF (congestive heart failure) ([place]) 12/04/2019  . Chronic respiratory failure with hypoxia and hypercapnia ([place]) 11/20/2019  . Respiratory failure with hypoxia ([place]) 11/04/2019  . Pressure injury of skin 10/22/2019  . Chronic diastolic CHF (congestive heart failure) ([place]) 10/22/2019  . Chronic back pain 10/22/2019  . Palliative care by specialist   . Goals of care, counseling/discussion   . DNR (do not resuscitate)   . Acute respiratory failure with hypercapnia ([place]) 09/29/2019  . Drug induced constipation   . Leukocytosis   . Supplemental oxygen dependent   . Anxiety state   . Debility 09/13/2019  . Back pain 09/08/2019  . Essential hypertension 07/27/2019  . Allergic rhinitis 03/14/2018  . Acute respiratory failure with hypoxia ([place]) 03/08/2018  . Elevated MCV 01/31/2018  . Vitamin B12 deficiency 01/31/2018  . Acute on chronic respiratory failure with hypoxia ([place]) 01/31/2018  . Respiratory failure, acute-on-chronic ([place]) 12/31/2017  . COPD exacerbation ([place]) 09/18/2017  . Hypoxia   . Hyperlipidemia 08/23/2017  . Depression 08/23/2017  . Anxiety 08/23/2017   . Hypercalcemia 08/23/2017  . Thrombocytosis 08/23/2017  . COPD with acute exacerbation ([place]) 08/23/2017  . ADD (attention deficit disorder) 08/30/2012  . Bipolar disorder, unspecified ([place]) 08/30/2012  . COPD  GOLD ? III with increased Eos and reversibility ? ACOS ?  08/30/2012  . Tobacco abuse 08/30/2012    Orientation RESPIRATION BLADDER Height & Weight     Self, Time, Situation, Place  O2 Continent Weight: 91.1 kg Height:  5\' 3"  (160 cm)  BEHAVIORAL SYMPTOMS/MOOD NEUROLOGICAL BOWEL NUTRITION STATUS      Continent Diet  AMBULATORY STATUS COMMUNICATION OF NEEDS Skin   Limited Assist Verbally Normal                       Personal Care Assistance Level of Assistance  Bathing, Feeding, Dressing Bathing Assistance: Limited assistance Feeding assistance: Independent Dressing Assistance: Limited assistance     Functional Limitations Info  Sight, Hearing, Speech Sight Info: Adequate Hearing Info: Adequate Speech Info: Adequate    SPECIAL CARE FACTORS FREQUENCY  PT (By licensed PT), OT (By licensed OT)     PT Frequency: 5x weekly OT Frequency: 5x weekly            Contractures Contractures Info: Not present    Additional Factors Info  Code Status, Allergies, Psychotropic Code Status Info: DNR Allergies Info: see mar Psychotropic Info: see mar         Current Medications (12/09/2019):  This is the current hospital active medication list Current Facility-Administered Medications  Medication Dose Route Frequency Provider Last Rate Last Admin  . 0.9 %  sodium chloride infusion  250 mL Intravenous PRN Toy Baker, MD      . acetaminophen (TYLENOL) tablet 650 mg  650 mg Oral Q6H PRN Toy Baker, MD   650 mg at 12/08/19 0148   Or  . acetaminophen (TYLENOL) suppository 650 mg  650 mg Rectal Q6H PRN Doutova, Anastassia, MD      . albuterol (PROVENTIL) (2.5 MG/3ML) 0.083% nebulizer solution 2.5 mg  2.5 mg Nebulization Q2H PRN Doutova, Anastassia,  MD   2.5 mg at 12/07/19 0301  . ALPRAZolam Duanne Moron) tablet 0.5 mg  0.5 mg Oral TID PRN Toy Baker, MD   0.5 mg at 12/08/19 0519  . apixaban (ELIQUIS) tablet 5 mg  5 mg Oral BID Toy Baker, MD   5 mg at 12/09/19 1022  . baclofen (LIORESAL) tablet 10 mg  10 mg Oral TID Toy Baker, MD   10 mg at 12/09/19 0821  . bisacodyl (DULCOLAX) suppository 10 mg  10 mg Rectal Daily PRN Doutova, Anastassia, MD      . budesonide (PULMICORT) nebulizer solution 0.5 mg  0.5 mg Nebulization BID Doutova, Anastassia, MD   0.5 mg at 12/09/19 0855  . famotidine (PEPCID) tablet 20 mg  20 mg Oral QHS Doutova, Anastassia, MD   20 mg at 12/08/19 2126  . furosemide (LASIX) tablet 40 mg  40 mg Oral Daily Kc, Ramesh, MD   40 mg at 12/09/19 1022  . guaiFENesin (MUCINEX) 12 hr tablet 600 mg  600 mg Oral BID Florencia Reasons, MD   600 mg at 12/09/19 9242  . HYDROcodone-acetaminophen (NORCO/VICODIN) 5-325 MG per tablet 1 tablet  1 tablet Oral Q4H PRN Antonieta Pert, MD   1 tablet at 12/09/19 1022  . ipratropium-albuterol (DUONEB) 0.5-2.5 (3) MG/3ML nebulizer solution 3 mL  3 mL Nebulization TID Florencia Reasons, MD   3 mL at 12/09/19 0849  . lamoTRIgine (LAMICTAL) tablet 200 mg  200 mg Oral QHS Toy Baker, MD   200 mg at 12/08/19 2126  . lidocaine (LIDODERM) 5 % 1 patch  1 patch Transdermal Q24H Kc, Ramesh, MD      . loratadine (CLARITIN) tablet 10 mg  10 mg Oral QPM Doutova, Anastassia, MD   10 mg at 12/08/19 1717  . methylphenidate (CONCERTA) CR tablet 72 mg  72 mg Oral Daily Doutova, Anastassia, MD   72 mg at 12/09/19 1021  . montelukast (SINGULAIR) tablet 10 mg  10 mg Oral QHS Toy Baker, MD   10 mg at 12/08/19 2126  . ondansetron (ZOFRAN) tablet 4 mg  4 mg Oral Q6H PRN Toy Baker, MD   4 mg at 12/08/19 0251   Or  . ondansetron (ZOFRAN) injection 4 mg  4 mg Intravenous Q6H PRN Toy Baker, MD   4 mg at 12/07/19 0210  . pantoprazole (PROTONIX) EC tablet 40 mg  40 mg Oral QAC breakfast  Toy Baker, MD   40 mg at 12/09/19 0821  . QUEtiapine (SEROQUEL) tablet 150 mg  150 mg Oral QHS Doutova, Anastassia, MD   150 mg at 12/08/19 2126  . risperiDONE (RISPERDAL) tablet 3 mg  3 mg Oral QHS Toy Baker, MD   3 mg at 12/08/19 2126  . senna-docusate (Senokot-S) tablet 1 tablet  1 tablet Oral BID Toy Baker, MD   1 tablet at 12/09/19 6834  . sodium chloride flush (NS) 0.9 % injection 3 mL  3 mL Intravenous Q12H Toy Baker, MD   3 mL at 12/09/19 0822  . sodium chloride flush (NS)  0.9 % injection 3 mL  3 mL Intravenous PRN Doutova, Anastassia, MD      . traMADol (ULTRAM) tablet 50 mg  50 mg Oral Q6H PRN Antonieta Pert, MD   50 mg at 12/09/19 0735  . venlafaxine XR (EFFEXOR-XR) 24 hr capsule 150 mg  150 mg Oral QHS Toy Baker, MD   150 mg at 12/08/19 2126  . vitamin B-12 (CYANOCOBALAMIN) tablet 1,000 mcg  1,000 mcg Oral Daily Florencia Reasons, MD   1,000 mcg at 12/09/19 1021     Discharge Medications: Please see discharge summary for a list of discharge medications.  Relevant Imaging Results:  Relevant Lab Results:   Additional Information SSN [id number]  Joaquin Courts, RN

## 2019-12-09 NOTE — TOC Progression Note (Signed)
Transition of Care [facility]) - Progression Note    Patient Details  Name: Julia Daniels MRN: [id number] Date of Birth: [birth year]  [facility] CM/SW Contact  Joaquin Courts, RN Phone Number: 12/09/2019, 2:05 PM  Clinical Narrative:    CM spoke with patient regarding SNF recommendations.  Patient is in agreement to go to SNF for rehab and shares that she was recently at [facility] and would like to return there if possible.  CM discussed that FL2 will be sent to [facility] for review but also received permission to fax to all area facilities in case no bed is available at [facility]. FL2 faxed out.  [place] screen submitted and has gone to level 2 review, all requested clinicals uploaded.      Expected Discharge Plan: [place] Barriers to Discharge: Continued Medical Work up  Expected Discharge Plan and Services Expected Discharge Plan: [place]   Discharge Planning Services: CM Consult Post Acute Care Choice: [place] Living arrangements for the past 2 months: [place]                                       Social Determinants of Health (SDOH) Interventions    Readmission Risk Interventions No flowsheet data found.

## 2019-12-09 NOTE — Progress Notes (Signed)
Patient MEWS turned red due to blood pressure of 79/54 and pulse of 113. MD notified and fluid bolus was ordered.  RED MEWS documentation implemented.

## 2019-12-09 NOTE — Progress Notes (Signed)
PROGRESS NOTE    Julia Daniels  OXB:[id number] DOB: 06-02-1950 DOA: 12/04/2019 PCP: Simona Huh, NP   Chief Complaint  Patient presents with  . Shortness of Breath  Brief Narrative: 69 year old female with significant history of COPD chronic respiratory failure on 4 L oxygen, diastolic CHF, asthma morbid obesity with BMI 35, recent PE, chronic back pain and compression fracture presented with worsening shortness of breath.  She has been in the rehab and was discharged to home 2 days prior to admission.  She reports living at home was very long and she was not getting enough oxygen and had to turn it up, her shortness of breath has since started to increase and was on 6 L nasal cannula.  She was seen in the ED chest x-ray showed fluid congestion, was given a steroid Solu-Medrol and was admitted.  Subjective: Patient complains of ongoing back pain and not controlled with current pain medication.  She feels too weak to get up and walk and concerned about going home Shortness of breath is stable unchanged.  Assessment & Plan:  Acute on chronic hypoxic respiratory failure, on 4 L nasal cannula at home. ABG with compensated pH, chronic hypercapnia.  Admitted with acute on chronic diastolic CHF, severe COPD with acute exacerbation.  Overall respiratory status is stable mostly concerned about her chronic back pain.  Will change Lasix to p.o., continue bronchodilators nebulizers supplemental oxygen incentive spirometry.  Increase PT.   Acute COPD with exacerbation continue prednisone taper, baseline on 10 mg.on Fasenra injection every 8 weeks, last dose on November 11. Her COPD maintenance medication was changed to Stiolto 2 puffs every morning and budesonide 0.5 mg per neb twice a day.  She is following Dr. Melvyn Novas from pulmonary.  Patient completed prednisone taper 11/30.   Acute on chronic diastolic CHF, diuresing well.  Net - 5 liters. Wt down to 200 lb, was 211 on 11/24. Changed to oral  Lasix.  Monitor weight.    Cad/HTN: Blood pressure stable, no chest pain.Continue Lasix.  History of PE continue Eliquis from home.  Microcytic anemia hemoglobin overall is stable, likely from anemia of chronic disease with borderline B12 and started on supplementation.  Will advise outpatient FOBT follow-up.  Denies acute blood loss or melena in the stool.  Monitor H&H while here  Bipolar disorder, unspecified: on multiple psych medication with lamotrigine, methylphenidate, Risperdal, venlafaxine and Seroquel and will continue the same.  We discussed about sedating medication interfering with her breathing status.  Chronic back pain/compression fracture T6, pain poorly controlled, increase hydrocodone to every 4 as needed, add lidocaine patch.  PT eval requested and will likely need to go to skilled nursing facility rather than home.   Constipation KUB with possible ileus but no other acute finding, patient has no other symptoms.  No nausea vomiting.  Tolerating diet.  Continue laxatives.  Morbid obesity with BMI 35: she will benefit weight loss.Will need sleep apnea evaluation as outpatient.Will advise her to follow-up with Dr. Melvyn Novas  Superficial leg wounds bilaterally likely due to edema, wound care input appreciated.  Nutrition: Diet Order            Diet Heart Room service appropriate? Yes; Fluid consistency: Thin  Diet effective now                 Body mass index is 35.58 kg/m.  Pressure Ulcer: Pressure Injury 10/22/19 Buttocks Medial Stage 2 -  Partial thickness loss of dermis presenting as a shallow open injury  with a red, pink wound bed without slough. (Active)  10/22/19 0224  Location: Buttocks  Location Orientation: Medial  Staging: Stage 2 -  Partial thickness loss of dermis presenting as a shallow open injury with a red, pink wound bed without slough.  Wound Description (Comments):   Present on Admission:      Pressure Injury 12/05/19 Ankle Anterior;Left;Lateral  Stage 2 -  Partial thickness loss of dermis presenting as a shallow open injury with a red, pink wound bed without slough. red non-blanching (Active)  12/05/19 0130  Location: Ankle  Location Orientation: Anterior;Left;Lateral  Staging: Stage 2 -  Partial thickness loss of dermis presenting as a shallow open injury with a red, pink wound bed without slough.  Wound Description (Comments): red non-blanching  Present on Admission: Yes    DVT prophylaxis: ELIQUIS code Status:   Code Status: DNR  Family Communication: plan of care discussed with patient at bedside.  Status is: Inpatient  Remains inpatient appropriate because:Inpatient level of care appropriate due to severity of illness and Ongoing management of CHF.  Acute on chronic back pain.   Dispo: The patient is from: Home              Anticipated d/c is to: SNF              Anticipated d/c date is: 1 day              Patient currently is not medically stable to d/c.  Consultants:see note  Procedures:see note  Culture/Microbiology    Component Value Date/Time   SDES  08/23/2017 1206    BLOOD LEFT ANTECUBITAL Performed at [facility], [place] [address] [place], [place] [address]    SDES  08/23/2017 1206    BLOOD BLOOD LEFT FOREARM Performed at [facility], [place] [address], [place], [place] [address]    SPECREQUEST  08/23/2017 1206    BOTTLES DRAWN AEROBIC AND ANAEROBIC Blood Culture results may not be optimal due to an inadequate volume of blood received in culture bottles Performed at [facility], [place] [address] [place], [place] [address]    SPECREQUEST  08/23/2017 1206    BOTTLES DRAWN AEROBIC AND ANAEROBIC Blood Culture adequate volume Performed at [place] [address], [place], [place] [address]    CULT  08/23/2017 1206    NO GROWTH 5 DAYS Performed at [place] [address], [place], [place] [address]    CULT   08/23/2017 1206    NO GROWTH 5 DAYS Performed at [facility], [place] [address], [place], [place] [address]    REPTSTATUS 08/28/2017 FINAL 08/23/2017 1206   REPTSTATUS 08/28/2017 FINAL 08/23/2017 1206    Other culture-see note  Medications: Scheduled Meds: . apixaban  5 mg Oral BID  . baclofen  10 mg Oral TID  . budesonide  0.5 mg Nebulization BID  . famotidine  20 mg Oral QHS  . furosemide  40 mg Oral Daily  . guaiFENesin  600 mg Oral BID  . ipratropium-albuterol  3 mL Nebulization TID  . lamoTRIgine  200 mg Oral QHS  . lidocaine  1 patch Transdermal Q24H  . loratadine  10 mg Oral QPM  . methylphenidate  72 mg Oral Daily  . montelukast  10 mg Oral QHS  . pantoprazole  40 mg Oral QAC breakfast  . QUEtiapine  150 mg Oral QHS  . risperiDONE  3 mg Oral QHS  .  senna-docusate  1 tablet Oral BID  . sodium chloride flush  3 mL Intravenous Q12H  . venlafaxine XR  150 mg Oral QHS  . vitamin B-12  1,000 mcg Oral Daily   Continuous Infusions: . sodium chloride      Antimicrobials: Anti-infectives (From admission, onward)   None     Objective: Vitals: Today's Vitals   12/09/19 1022 12/09/19 1122 12/09/19 1318 12/09/19 1403  BP:   120/79   Pulse:   (!) 106   Resp:   19   Temp:   99 F (37.2 C)   TempSrc:   Oral   SpO2:   94% 97%  Weight:      Height:      PainSc: 8  8       Intake/Output Summary (Last 24 hours) at 12/09/2019 1422 Last data filed at 12/09/2019 1320 Gross per 24 hour  Intake 1466.67 ml  Output 1700 ml  Net -233.33 ml   Filed Weights   12/04/19 1952 12/07/19 0600 12/08/19 0500  Weight: 95.7 kg 91.7 kg 91.1 kg   Weight change:   Intake/Output from previous day: 11/29 0701 - 11/30 0700 In: 1826.7 [P.O.:1560; IV Piggyback:266.7] Out: 1900 [Urine:1900] Intake/Output this shift: Total I/O In: 480 [P.O.:480] Out: 600 [Urine:600]  Examination: General exam: AAOx3 , NAD, weak appearing. HEENT:Oral mucosa moist, Ear/Nose WNL grossly,  dentition normal. Respiratory system: bilaterally diminished breath sound, no wheezing or crackles,,no use of accessory muscle Cardiovascular system: S1 & S2 +, No JVD,. Gastrointestinal system: Abdomen soft, NT,ND, BS+ Nervous System:Alert, awake, moving extremities and grossly nonfocal Extremities: No edema, distal peripheral pulses palpable.  Skin: No rashes,no icterus. MSK: Normal muscle bulk,tone, power    Data Reviewed: I have personally reviewed following labs and imaging studies CBC: Recent Labs  Lab 12/05/19 0142 12/05/19 0519 12/07/19 0642 12/08/19 0523 12/09/19 0603  WBC 8.1 6.2 12.7* 12.6* 9.1  NEUTROABS 7.6 5.5 9.6* 9.4*  --   HGB 10.5* 10.2* 12.7 12.9 12.2  HCT 34.4* 33.7* 41.3 41.7 39.9  MCV 105.5* 105.0* 102.7* 102.2* 102.8*  PLT 237 241 366 367 937   Basic Metabolic Panel: Recent Labs  Lab 12/04/19 2006 12/05/19 0519 12/07/19 0642 12/08/19 0523 12/09/19 0603  NA 139 137 141 138 138  K 4.0 4.2 3.8 3.9 4.2  CL 97* 96* 97* 96* 97*  CO2 34* 32 33* 31 33*  GLUCOSE 95 133* 116* 125* 100*  BUN 10 12 17 17 18   CREATININE 0.63 0.62 0.60 0.64 0.64  CALCIUM 10.1 9.8 10.4* 10.5* 9.9  MG  --  2.3 2.3 2.2  --   PHOS  --  3.6  --   --   --    GFR: Estimated Creatinine Clearance: 73.1 mL/min (by C-G formula based on SCr of 0.64 mg/dL). Liver Function Tests: Recent Labs  Lab 12/05/19 0142 12/05/19 0519  AST 11* 11*  ALT 16 15  ALKPHOS 68 67  BILITOT 0.7 0.6  PROT 6.8 6.4*  ALBUMIN 3.7 3.5   No results for input(s): LIPASE, AMYLASE in the last 168 hours. No results for input(s): AMMONIA in the last 168 hours. Coagulation Profile: No results for input(s): INR, PROTIME in the last 168 hours. Cardiac Enzymes: No results for input(s): CKTOTAL, CKMB, CKMBINDEX, TROPONINI in the last 168 hours. BNP (last 3 results) No results for input(s): PROBNP in the last 8760 hours. HbA1C: No results for input(s): HGBA1C in the last 72 hours. CBG: No results for  input(s): GLUCAP in  the last 168 hours. Lipid Profile: No results for input(s): CHOL, HDL, LDLCALC, TRIG, CHOLHDL, LDLDIRECT in the last 72 hours. Thyroid Function Tests: No results for input(s): TSH, T4TOTAL, FREET4, T3FREE, THYROIDAB in the last 72 hours. Anemia Panel: No results for input(s): VITAMINB12, FOLATE, FERRITIN, TIBC, IRON, RETICCTPCT in the last 72 hours. Sepsis Labs: No results for input(s): PROCALCITON, LATICACIDVEN in the last 168 hours.  Recent Results (from the past 240 hour(s))  Resp Panel by RT-PCR (Flu A&B, Covid) Nasopharyngeal Swab     Status: None   Collection Time: 12/04/19  9:20 PM   Specimen: Nasopharyngeal Swab; Nasopharyngeal(NP) swabs in vial transport medium  Result Value Ref Range Status   SARS Coronavirus 2 by RT PCR NEGATIVE NEGATIVE Final    Comment: (NOTE) SARS-CoV-2 target nucleic acids are NOT DETECTED.  The SARS-CoV-2 RNA is generally detectable in upper respiratory specimens during the acute phase of infection. The lowest concentration of SARS-CoV-2 viral copies this assay can detect is 138 copies/mL. A negative result does not preclude SARS-Cov-2 infection and should not be used as the sole basis for treatment or other patient management decisions. A negative result may occur with  improper specimen collection/handling, submission of specimen other than nasopharyngeal swab, presence of viral mutation(s) within the areas targeted by this assay, and inadequate number of viral copies(<138 copies/mL). A negative result must be combined with clinical observations, patient history, and epidemiological information. The expected result is Negative.  Fact Sheet for Patients:  EntrepreneurPulse.com.au  Fact Sheet for Healthcare Providers:  IncredibleEmployment.be  This test is no t yet approved or cleared by the [place] FDA and  has been authorized for detection and/or diagnosis of SARS-CoV-2 by FDA under  an Emergency Use Authorization (EUA). This EUA will remain  in effect (meaning this test can be used) for the duration of the COVID-19 declaration under Section 564(b)(1) of the Act, 21 U.S.C.section 360bbb-3(b)(1), unless the authorization is terminated  or revoked sooner.       Influenza A by PCR NEGATIVE NEGATIVE Final   Influenza B by PCR NEGATIVE NEGATIVE Final    Comment: (NOTE) The Xpert Xpress SARS-CoV-2/FLU/RSV plus assay is intended as an aid in the diagnosis of influenza from Nasopharyngeal swab specimens and should not be used as a sole basis for treatment. Nasal washings and aspirates are unacceptable for Xpert Xpress SARS-CoV-2/FLU/RSV testing.  Fact Sheet for Patients: EntrepreneurPulse.com.au  Fact Sheet for Healthcare Providers: IncredibleEmployment.be  This test is not yet approved or cleared by the [place] FDA and has been authorized for detection and/or diagnosis of SARS-CoV-2 by FDA under an Emergency Use Authorization (EUA). This EUA will remain in effect (meaning this test can be used) for the duration of the COVID-19 declaration under Section 564(b)(1) of the Act, 21 U.S.C. section 360bbb-3(b)(1), unless the authorization is terminated or revoked.  Performed at [facility], [place] [address] [place], [place] [address]      Radiology Studies: No results found.   LOS: 5 days   Antonieta Pert, MD [facility]  12/09/2019, 2:22 PM

## 2019-12-10 LAB — BASIC METABOLIC PANEL
Anion gap: 9 (ref 5–15)
BUN: 15 mg/dL (ref 8–23)
CO2: 32 mmol/L (ref 22–32)
Calcium: 9.6 mg/dL (ref 8.9–10.3)
Chloride: 96 mmol/L — ABNORMAL LOW (ref 98–111)
Creatinine, Ser: 0.63 mg/dL (ref 0.44–1.00)
GFR, Estimated: >60 mL/min (ref >60)
Glucose, Bld: 94 mg/dL (ref 70–99)
Potassium: 4.3 mmol/L (ref 3.5–5.1)
Sodium: 137 mmol/L (ref 135–145)

## 2019-12-10 LAB — CBC
HCT: 39.8 % (ref 36.0–46.0)
Hemoglobin: 11.9 g/dL — ABNORMAL LOW (ref 12.0–15.0)
MCH: 31.2 pg (ref 26.0–34.0)
MCHC: 29.9 g/dL — ABNORMAL LOW (ref 30.0–36.0)
MCV: 104.2 fL — ABNORMAL HIGH (ref 80.0–100.0)
Platelets: 334 10*3/uL (ref 150–400)
RBC: 3.82 MIL/uL — ABNORMAL LOW (ref 3.87–5.11)
RDW: 13.3 % (ref 11.5–15.5)
WBC: 9.9 10*3/uL (ref 4.0–10.5)
nRBC: 0 % (ref 0.0–0.2)

## 2019-12-10 MED ORDER — PREDNISONE 5 MG PO TABS
10.0000 mg | ORAL_TABLET | Freq: Every day | ORAL | Status: DC
  Administered 2019-12-10 – 2019-12-12 (×3): 10 mg via ORAL
  Filled 2019-12-10 (×3): qty 2

## 2019-12-10 NOTE — Progress Notes (Signed)
PROGRESS NOTE    Julia Daniels  KJZ:[id number] DOB: 1950-06-17 DOA: 12/04/2019 PCP: Simona Huh, NP   Chief Complaint  Patient presents with  . Shortness of Breath  Brief Narrative: 69 year old female with significant history of COPD chronic respiratory failure on 4 L oxygen, diastolic CHF, asthma morbid obesity with BMI 35, recent PE, chronic back pain and compression fracture presented with worsening shortness of breath.  She has been in the rehab and was discharged to home 2 days prior to admission.  She reports living at home was very long and she was not getting enough oxygen and had to turn it up, her shortness of breath has since started to increase and was on 6 L nasal cannula.  She was seen in the ED chest x-ray showed fluid congestion, was given a steroid Solu-Medrol and was admitted. Patient managed with diuretics, steroids.  She was having acute on chronic back pain in the setting of acute hospitalization, deconditioning hydrocodone was increased slightly advised on local supportive measures. Overall patient is improving.  Subjective: She feels better today, pain is controlled.  Breathing somewhat better today.    Assessment & Plan:  Acute on chronic hypoxic respiratory failure, on 4 L nasal cannula at home. ABG with compensated pH, chronic hypercapnia.  Admitted with acute on chronic diastolic CHF, severe COPD with acute exacerbation.  Respiratory status overall stable and will continue on oral Lasix, cefdinir steroid taper.  Continue bronchodilators nebulizer incentive spirometry, increase activity.   Acute COPD with exacerbation: Respiratory status has improved, states finished the steroid taper and we will cont on prednisone 10 mg daily, continue on bronchodilators. She is on Fasenra injection every 8 weeks, last dose on November 11.Her COPD maintenance medication was changed to Stiolto 2 puffs every morning and budesonide 0.5 mg per neb twice a day.  She is following  Dr. Melvyn Novas from pulmonary.  Acute on chronic diastolic CHF: Euvolemic continue on oral Lasix.Wt down to 200 lb, was 211 on 11/24.  Monitor intake output and daily weight.  Cad/HTN: Blood pressure is controlled. Currently no chest pain and she will keep on Lasix.  History of PE continue home Eliquis  Microcytic anemia: hemoglobin overall is stable at 11-12 gm. likely from anemia of chronic disease with borderline B12 and started on supplementation.  Will advise outpatient FOBT follow-up and outpatient GI evaluation.  Bipolar disorder, unspecified: on multiple psych medication with lamotrigine, methylphenidate, Risperdal, venlafaxine and Seroquel and will continue the same.  We discussed about sedating medication interfering with her breathing status.  Chronic back pain/compression fracture T6, pain poorly controlled, continue hydrocodone every 4 hours as needed, continue lidocaine patch, K pad heat and supportive measures.  PT OT eval   Constipation-having bowel movement.  Continue laxatives as needed  Morbid obesity with BMI 35: she will benefit weight loss.Will need sleep apnea evaluation as outpatient.Will advise her to follow-up with Dr. Melvyn Novas  Superficial leg wounds bilaterally likely due to edema, wound care input appreciated.  Nutrition: Diet Order            Diet Heart Room service appropriate? Yes; Fluid consistency: Thin  Diet effective now                 Body mass index is 35.58 kg/m.  Pressure Ulcer: Pressure Injury 10/22/19 Buttocks Medial Stage 2 -  Partial thickness loss of dermis presenting as a shallow open injury with a red, pink wound bed without slough. (Active)  10/22/19 0224  Location: Buttocks  Location Orientation: Medial  Staging: Stage 2 -  Partial thickness loss of dermis presenting as a shallow open injury with a red, pink wound bed without slough.  Wound Description (Comments):   Present on Admission:      Pressure Injury 12/05/19 Ankle  Anterior;Left;Lateral Stage 2 -  Partial thickness loss of dermis presenting as a shallow open injury with a red, pink wound bed without slough. red non-blanching (Active)  12/05/19 0130  Location: Ankle  Location Orientation: Anterior;Left;Lateral  Staging: Stage 2 -  Partial thickness loss of dermis presenting as a shallow open injury with a red, pink wound bed without slough.  Wound Description (Comments): red non-blanching  Present on Admission: Yes    DVT prophylaxis: ELIQUIS code Status:   Code Status: DNR  Family Communication: plan of care discussed with patient at bedside.  Status is: Inpatient  Remains inpatient appropriate because:Inpatient level of care appropriate due to severity of illness and Ongoing management of CHF.  Acute on chronic back pain.   Dispo: The patient is from: Home              Anticipated d/c is to: SNF              Anticipated d/c date is: 1 day              Patient currently is not medically stable to d/c.  Consultants:see note  Procedures:see note  Culture/Microbiology    Component Value Date/Time   SDES  08/23/2017 1206    BLOOD LEFT ANTECUBITAL Performed at [facility], [place] [address] [place], [place] [address]    SDES  08/23/2017 1206    BLOOD BLOOD LEFT FOREARM Performed at [facility], [place] [address], [place], [place] [address]    SPECREQUEST  08/23/2017 1206    BOTTLES DRAWN AEROBIC AND ANAEROBIC Blood Culture results may not be optimal due to an inadequate volume of blood received in culture bottles Performed at [facility], [place] [address], [place], [place] [address]    SPECREQUEST  08/23/2017 1206    BOTTLES DRAWN AEROBIC AND ANAEROBIC Blood Culture adequate volume Performed at [place] [address] [place], [place] [address]    CULT  08/23/2017 1206    NO GROWTH 5 DAYS Performed at [facility] [address] [place], [place]  [address]    CULT  08/23/2017 1206    NO GROWTH 5 DAYS Performed at [facility], [place] [address] [place],  [address]    REPTSTATUS 08/28/2017 FINAL 08/23/2017 1206   REPTSTATUS 08/28/2017 FINAL 08/23/2017 1206    Other culture-see note  Medications: Scheduled Meds: . apixaban  5 mg Oral BID  . baclofen  10 mg Oral TID  . budesonide  0.5 mg Nebulization BID  . famotidine  20 mg Oral QHS  . furosemide  40 mg Oral Daily  . guaiFENesin  600 mg Oral BID  . ipratropium-albuterol  3 mL Nebulization TID  . lamoTRIgine  200 mg Oral QHS  . lidocaine  1 patch Transdermal Q24H  . loratadine  10 mg Oral QPM  . methylphenidate  72 mg Oral Daily  . montelukast  10 mg Oral QHS  . pantoprazole  40 mg Oral QAC breakfast  . QUEtiapine  150 mg Oral QHS  . risperiDONE  3 mg Oral QHS  . senna-docusate  1 tablet Oral BID  . sodium chloride flush  3 mL Intravenous  Q12H  . venlafaxine XR  150 mg Oral QHS  . vitamin B-12  1,000 mcg Oral Daily   Continuous Infusions: . sodium chloride      Antimicrobials: Anti-infectives (From admission, onward)   None     Objective: Vitals: Today's Vitals   12/10/19 0811 12/10/19 0837 12/10/19 0911 12/10/19 1225  BP:      Pulse:      Resp:      Temp:      TempSrc:      SpO2:  97%    Weight:      Height:      PainSc: 9   8  10-Worst pain ever    Intake/Output Summary (Last 24 hours) at 12/10/2019 1304 Last data filed at 12/10/2019 1000 Gross per 24 hour  Intake 600 ml  Output 1000 ml  Net -400 ml   Filed Weights   12/04/19 1952 12/07/19 0600 12/08/19 0500  Weight: 95.7 kg 91.7 kg 91.1 kg   Weight change:   Intake/Output from previous day: 11/30 0701 - 12/01 0700 In: 720 [P.O.:720] Out: 1350 [Urine:1350] Intake/Output this shift: Total I/O In: 240 [P.O.:240] Out: 250 [Urine:250]  Examination: General exam: AAOx3, obese,NAD, weak appearing. HEENT:Oral mucosa moist, Ear/Nose WNL grossly, dentition normal. Respiratory system:  bilaterally clear/diminished at base,no use of accessory muscle Cardiovascular system: S1 & S2 +, No JVD,. Gastrointestinal system: Abdomen soft, obese, NT,ND, BS+ Nervous System:Alert, awake, moving extremities and grossly nonfocal Extremities: No edema, distal peripheral pulses palpable.  Skin: No rashes,no icterus. MSK: Normal muscle bulk,tone, power  Data Reviewed: I have personally reviewed following labs and imaging studies CBC: Recent Labs  Lab 12/05/19 0142 12/05/19 0142 12/05/19 0519 12/07/19 2694 12/08/19 0523 12/09/19 0603 12/10/19 0527  WBC 8.1   < > 6.2 12.7* 12.6* 9.1 9.9  NEUTROABS 7.6  --  5.5 9.6* 9.4*  --   --   HGB 10.5*   < > 10.2* 12.7 12.9 12.2 11.9*  HCT 34.4*   < > 33.7* 41.3 41.7 39.9 39.8  MCV 105.5*   < > 105.0* 102.7* 102.2* 102.8* 104.2*  PLT 237   < > 241 366 367 321 334   < > = values in this interval not displayed.   Basic Metabolic Panel: Recent Labs  Lab 12/05/19 0519 12/07/19 0642 12/08/19 0523 12/09/19 0603 12/10/19 0527  NA 137 141 138 138 137  K 4.2 3.8 3.9 4.2 4.3  CL 96* 97* 96* 97* 96*  CO2 32 33* 31 33* 32  GLUCOSE 133* 116* 125* 100* 94  BUN 12 17 17 18 15   CREATININE 0.62 0.60 0.64 0.64 0.63  CALCIUM 9.8 10.4* 10.5* 9.9 9.6  MG 2.3 2.3 2.2  --   --   PHOS 3.6  --   --   --   --    GFR: Estimated Creatinine Clearance: 73.1 mL/min (by C-G formula based on SCr of 0.63 mg/dL). Liver Function Tests: Recent Labs  Lab 12/05/19 0142 12/05/19 0519  AST 11* 11*  ALT 16 15  ALKPHOS 68 67  BILITOT 0.7 0.6  PROT 6.8 6.4*  ALBUMIN 3.7 3.5   No results for input(s): LIPASE, AMYLASE in the last 168 hours. No results for input(s): AMMONIA in the last 168 hours. Coagulation Profile: No results for input(s): INR, PROTIME in the last 168 hours. Cardiac Enzymes: No results for input(s): CKTOTAL, CKMB, CKMBINDEX, TROPONINI in the last 168 hours. BNP (last 3 results) No results for input(s): PROBNP in the last  8760  hours. HbA1C: No results for input(s): HGBA1C in the last 72 hours. CBG: No results for input(s): GLUCAP in the last 168 hours. Lipid Profile: No results for input(s): CHOL, HDL, LDLCALC, TRIG, CHOLHDL, LDLDIRECT in the last 72 hours. Thyroid Function Tests: No results for input(s): TSH, T4TOTAL, FREET4, T3FREE, THYROIDAB in the last 72 hours. Anemia Panel: No results for input(s): VITAMINB12, FOLATE, FERRITIN, TIBC, IRON, RETICCTPCT in the last 72 hours. Sepsis Labs: No results for input(s): PROCALCITON, LATICACIDVEN in the last 168 hours.  Recent Results (from the past 240 hour(s))  Resp Panel by RT-PCR (Flu A&B, Covid) Nasopharyngeal Swab     Status: None   Collection Time: 12/04/19  9:20 PM   Specimen: Nasopharyngeal Swab; Nasopharyngeal(NP) swabs in vial transport medium  Result Value Ref Range Status   SARS Coronavirus 2 by RT PCR NEGATIVE NEGATIVE Final    Comment: (NOTE) SARS-CoV-2 target nucleic acids are NOT DETECTED.  The SARS-CoV-2 RNA is generally detectable in upper respiratory specimens during the acute phase of infection. The lowest concentration of SARS-CoV-2 viral copies this assay can detect is 138 copies/mL. A negative result does not preclude SARS-Cov-2 infection and should not be used as the sole basis for treatment or other patient management decisions. A negative result may occur with  improper specimen collection/handling, submission of specimen other than nasopharyngeal swab, presence of viral mutation(s) within the areas targeted by this assay, and inadequate number of viral copies(<138 copies/mL). A negative result must be combined with clinical observations, patient history, and epidemiological information. The expected result is Negative.  Fact Sheet for Patients:  EntrepreneurPulse.com.au  Fact Sheet for Healthcare Providers:  IncredibleEmployment.be  This test is no t yet approved or cleared by the [place] FDA and  has been authorized for detection and/or diagnosis of SARS-CoV-2 by FDA under an Emergency Use Authorization (EUA). This EUA will remain  in effect (meaning this test can be used) for the duration of the COVID-19 declaration under Section 564(b)(1) of the Act, 21 U.S.C.section 360bbb-3(b)(1), unless the authorization is terminated  or revoked sooner.       Influenza A by PCR NEGATIVE NEGATIVE Final   Influenza B by PCR NEGATIVE NEGATIVE Final    Comment: (NOTE) The Xpert Xpress SARS-CoV-2/FLU/RSV plus assay is intended as an aid in the diagnosis of influenza from Nasopharyngeal swab specimens and should not be used as a sole basis for treatment. Nasal washings and aspirates are unacceptable for Xpert Xpress SARS-CoV-2/FLU/RSV testing.  Fact Sheet for Patients: EntrepreneurPulse.com.au  Fact Sheet for Healthcare Providers: IncredibleEmployment.be  This test is not yet approved or cleared by the [place] FDA and has been authorized for detection and/or diagnosis of SARS-CoV-2 by FDA under an Emergency Use Authorization (EUA). This EUA will remain in effect (meaning this test can be used) for the duration of the COVID-19 declaration under Section 564(b)(1) of the Act, 21 U.S.C. section 360bbb-3(b)(1), unless the authorization is terminated or revoked.  Performed at [facility], [place] [address], [place], [place] [address]      Radiology Studies: No results found.   LOS: 6 days   Antonieta Pert, MD [facility]  12/10/2019, 1:04 PM

## 2019-12-10 NOTE — TOC Progression Note (Signed)
Transition of Care [facility]) - Progression Note    Patient Details  Name: Julia Daniels MRN: [id number] Date of Birth: 08/21/1950  [facility] CM/SW Contact  Leeroy Cha, RN Phone Number: 12/10/2019, 10:12 AM  Clinical Narrative:    Spoke with patient concerning snf placement has two choices wants to go to [facility] notified.  SNF auth started by K.Megan Salon.   Expected Discharge Plan: [place] Barriers to Discharge: Continued Medical Work up  Expected Discharge Plan and Services Expected Discharge Plan: [place]   Discharge Planning Services: CM Consult Post Acute Care Choice: [place] Living arrangements for the past 2 months: [place]                                       Social Determinants of Health (SDOH) Interventions    Readmission Risk Interventions No flowsheet data found.

## 2019-12-11 NOTE — Progress Notes (Signed)
Physical Therapy Treatment Patient Details Name: Julia Daniels MRN: [id number] DOB: 08/04/1950 Today's Date: 12/11/2019    History of Present Illness Patient is a 69 year old female with history of diastolic congestive heart failure, COPD, asthma, H/O T6 compression fx, treated with TLSO. Patient presented from home with worsening SOB and new onset of significant back pain. Patient has had multiple hospitalizations in past 4 months, reports has been home ~2 days  from rehab.    PT Comments    Patient received in bed and reported need to use commode. Min assist required for supine to sit transfer via log roll technique and min assist to transfer bed>BSC with RW. Cues for safety and assist to manage walker provided throughout transfers. Patient required MAX assist for peri-care and seated rest after standing for ~ 2 minutes. Min assist to manage walker and steady with gait to ambulate to recliner in room, ~7 ft. No buckling at knees noted today however pt limited by back pain and with tendency to rush to sit requiring cues for safe reach back. She will continue to benefit from skilled PT interventions to address impairments and progress towards goals. Recommendation for SNF follow up remains appropriate.    Follow Up Recommendations  SNF     Equipment Recommendations  None recommended by PT    Recommendations for Other Services       Precautions / Restrictions Precautions Precautions: Back;Fall Precaution Comments: h/o compression fx;  TLSO brace; on 4L home O2 at baseline Required Braces or Orthoses: Spinal Brace Spinal Brace: Thoracolumbosacral orthotic Restrictions Weight Bearing Restrictions: No    Mobility  Bed Mobility Overal bed mobility: Needs Assistance Bed Mobility: Sit to Supine Rolling: Min guard Sidelying to sit: Min assist;HOB elevated       General bed mobility comments: pt required cues to sequence roll to Lt side and bring LE's off EOB, min assist to  raise trunk up and pt reaching for external support from therapist.  Transfers Overall transfer level: Needs assistance Equipment used: Rolling walker (2 wheeled) Transfers: Sit to/from [facility] Sit to Stand: Min assist;From elevated surface Stand pivot transfers: Min assist       General transfer comment: cues for proximity to RW and safety as pt with tendency to rush stand pivot/step transfer to move bed>BSC. assist to steady throughout stepping.   Ambulation/Gait Ambulation/Gait assistance: Min assist Gait Distance (Feet): 7 Feet Assistive device: Rolling walker (2 wheeled) Gait Pattern/deviations: Step-through pattern;Decreased step length - right;Decreased step length - left;Decreased stride length;Trunk flexed Gait velocity: decr   General Gait Details: pt required min assist to take several small steps to move from [facility] to recliner. Assist needed for safety with RW and safe technique to reach back to recliner.    Stairs             Wheelchair Mobility    Modified Rankin (Stroke Patients Only)       Balance Overall balance assessment: Needs assistance Sitting-balance support: Feet supported Sitting balance-Leahy Scale: Fair     Standing balance support: Bilateral upper extremity supported Standing balance-Leahy Scale: Poor Standing balance comment: reliant on external support                            Cognition Arousal/Alertness: Awake/alert Behavior During Therapy: WFL for tasks assessed/performed;Anxious Overall Cognitive Status: Within Functional Limits for tasks assessed  Exercises      General Comments General comments (skin integrity, edema, etc.): TLSO donned in sitting. pt on 3L/min and saturating at 96%.       Pertinent Vitals/Pain Pain Assessment: Faces Faces Pain Scale: Hurts even more Pain Location: mid back with activity Pain Descriptors /  Indicators: Aching;Grimacing;Guarding;Moaning Pain Intervention(s): Limited activity within patient's tolerance;Monitored during session;Repositioned;Heat applied    Home Living                      Prior Function            PT Goals (current goals can now be found in the care plan section) Acute Rehab PT Goals Patient Stated Goal: less pain PT Goal Formulation: With patient Time For Goal Achievement: 12/20/19 Potential to Achieve Goals: Good Progress towards PT goals: Progressing toward goals    Frequency    Min 3X/week      PT Plan Current plan remains appropriate    Co-evaluation              AM-PAC PT "6 Clicks" Mobility   Outcome Measure  Help needed turning from your back to your side while in a flat bed without using bedrails?: A Little Help needed moving from lying on your back to sitting on the side of a flat bed without using bedrails?: A Little Help needed moving to and from a bed to a chair (including a wheelchair)?: A Little Help needed standing up from a chair using your arms (e.g., wheelchair or bedside chair)?: A Little Help needed to walk in hospital room?: A Little Help needed climbing 3-5 steps with a railing? : Total 6 Click Score: 16    End of Session Equipment Utilized During Treatment: Oxygen;Gait belt Activity Tolerance: Patient limited by pain Patient left: in chair;with call bell/phone within reach;with chair alarm set Nurse Communication: Mobility status PT Visit Diagnosis: Difficulty in walking, not elsewhere classified (R26.2);Pain     Time: 1159-1226 (4 minutes pt on commode) PT Time Calculation (min) (ACUTE ONLY): 27 min  Charges:  $Gait Training: 8-22 mins $Therapeutic Activity: 8-22 mins                     Verner Mould, DPT Acute Rehabilitation Services  Office [phone] Pager [phone]  12/11/2019 1:38 PM

## 2019-12-11 NOTE — Discharge Summary (Addendum)
Physician Discharge Summary  Julia Daniels GHW:[id number] DOB: 1950-09-09 DOA: 12/04/2019  PCP: Simona Huh, NP  Admit date: 12/04/2019 Discharge date: 12/12/2019  Admitted From: SNF Disposition: Same  Recommendations for Outpatient Follow-up:  1. Follow up with PCP in 1-2 weeks 2. Please obtain BMP/CBC in one week  Discharge Condition: Stable CODE STATUS: DNR Diet recommendation: Low-salt low-fat diet  Brief/Interim Summary: 69 year old female with significant history of COPD chronic respiratory failure on 4 L oxygen, diastolic CHF, asthma morbid obesity with BMI 35, recent PE, chronic back pain and compression fracture presented with worsening shortness of breath.  She has been in the rehab and was discharged to home 2 days prior to admission.  She reports living at home was very long and she was not getting enough oxygen and had to turn it up, her shortness of breath has since started to increase and was on 6 L nasal cannula.  She was seen in the ED chest x-ray showed fluid congestion, was given a steroid Solu-Medrol and was admitted.  Patient admitted as above, acutely managed with diuretics, steroids. She was having acutely worsened back pain while in-house likely in the setting of poor ambulation bedbound status, patient's respiratory status markedly improved and is otherwise stable and agreeable for discharge back to facility.  Discharge Diagnoses:  Active Problems:   Bipolar disorder, unspecified ([place])   Hyperlipidemia   COPD with acute exacerbation ([place])   Essential hypertension   Chronic respiratory failure with hypoxia and hypercapnia (HCC)   Acute on chronic respiratory failure (HCC)   Acute on chronic diastolic CHF (congestive heart failure) ([place])   Anemia   Discharge Instructions  Discharge Instructions    Call MD for:  redness, tenderness, or signs of infection (pain, swelling, redness, odor or green/yellow discharge around incision site)   Complete by: As  directed    Call MD for:  severe uncontrolled pain   Complete by: As directed    Call MD for:  temperature >100.4   Complete by: As directed    Diet - low sodium heart healthy   Complete by: As directed    Discharge wound care:   Complete by: As directed    Apply small piece of Xeroform gauze Kellie Simmering # 294)  to right leg wound just below the knee. Cover with 2x2 and wrap both legs from the toes to just below the knees with Ace wrap Kellie Simmering # [phone]). Unwrap both legs daily, assess legs and change xeroform dressing on right side   Increase activity slowly   Complete by: As directed      Allergies as of 12/12/2019      Reactions   Sulfa Antibiotics Other (See Comments)   Mouth gets raw      Medication List    STOP taking these medications   lisinopril 40 MG tablet Commonly known as: ZESTRIL     TAKE these medications   albuterol 108 (90 Base) MCG/ACT inhaler Commonly known as: ProAir HFA Inhale 2 puffs into the lungs every 6 (six) hours as needed for wheezing or shortness of breath.   albuterol (2.5 MG/3ML) 0.083% nebulizer solution Commonly known as: PROVENTIL Take 3 mLs (2.5 mg total) by nebulization every 6 (six) hours as needed for wheezing or shortness of breath.   ALPRAZolam 0.5 MG tablet Commonly known as: XANAX Take 1 tablet (0.5 mg total) by mouth 3 (three) times daily as needed for anxiety.   apixaban 5 MG Tabs tablet Commonly known as: ELIQUIS Take 1  tablet (5 mg total) by mouth 2 (two) times daily.   atorvastatin 40 MG tablet Commonly known as: Lipitor Take 1 tablet (40 mg total) by mouth daily. What changed: when to take this   baclofen 10 MG tablet Commonly known as: LIORESAL Take 10 mg by mouth 3 (three) times daily.   barrier cream Crea Commonly known as: non-specified Apply 1 application topically daily as needed (apply to rash).   budesonide 0.5 MG/2ML nebulizer solution Commonly known as: Pulmicort Take 2 mLs (0.5 mg total) by nebulization 2  (two) times daily.   Dermacloud Oint Apply 1 application topically daily as needed (rash).   famotidine 20 MG tablet Commonly known as: PEPCID One at bedtime What changed:   how much to take  how to take this  when to take this  additional instructions   furosemide 40 MG tablet Commonly known as: LASIX Take 40 mg by mouth daily.   guaiFENesin 600 MG 12 hr tablet Commonly known as: MUCINEX Take 1 tablet (600 mg total) by mouth 2 (two) times daily. What changed: how much to take   HYDROcodone-acetaminophen 5-325 MG tablet Commonly known as: NORCO/VICODIN Take 1 tablet by mouth 3 (three) times daily as needed for moderate pain.   irbesartan 150 MG tablet Commonly known as: Avapro Take 1 tablet (150 mg total) by mouth daily.   lamoTRIgine 200 MG tablet Commonly known as: LAMICTAL Take 200 mg by mouth at bedtime.   lidocaine 5 % Commonly known as: LIDODERM Place 1 patch onto the skin daily. Remove & Discard patch within 12 hours or as directed by MD   loratadine 10 MG tablet Commonly known as: CLARITIN Take 10 mg by mouth every evening.   Methylphenidate HCl ER 72 MG Tbcr Take 72 mg by mouth daily.   montelukast 10 MG tablet Commonly known as: SINGULAIR Take 1 tablet (10 mg total) by mouth at bedtime.   pantoprazole 40 MG tablet Commonly known as: Protonix Take 1 tablet (40 mg total) by mouth daily. Take 30-60 min before first meal of the day   potassium chloride 10 MEQ tablet Commonly known as: KLOR-CON Take 20 mEq by mouth daily.   predniSONE 10 MG tablet Commonly known as: DELTASONE Take 1 tablet (10 mg total) by mouth daily with breakfast. Start taking on: December 13, 2019 What changed:   how much to take  how to take this  when to take this  additional instructions   QUEtiapine 100 MG tablet Commonly known as: SEROQUEL Take 1.5 tablets (150 mg total) by mouth at bedtime.   risperiDONE 3 MG tablet Commonly known as: RISPERDAL Take 1  tablet (3 mg total) by mouth at bedtime.   senna-docusate 8.6-50 MG tablet Commonly known as: Senokot-S Take 1 tablet by mouth 2 (two) times daily.   sodium chloride 0.65 % Soln nasal spray Commonly known as: OCEAN Place 1 spray into both nostrils as needed for congestion.   Stiolto Respimat 2.5-2.5 MCG/ACT Aers Generic drug: Tiotropium Bromide-Olodaterol Inhale 2 puffs into the lungs daily.   Symbicort 160-4.5 MCG/ACT inhaler Generic drug: budesonide-formoterol Inhale 2 puffs into the lungs 2 (two) times daily.   venlafaxine XR 150 MG 24 hr capsule Commonly known as: EFFEXOR-XR Take 150 mg by mouth at bedtime.   vitamin B-12 500 MCG tablet Commonly known as: CYANOCOBALAMIN Take 1 tablet (500 mcg total) by mouth daily.   Vitamin D (Ergocalciferol) 1.25 MG (50000 UNIT) Caps capsule Commonly known as: DRISDOL Take 50,000 Units by  mouth every Saturday.            Discharge Care Instructions  (From admission, onward)         Start     Ordered   12/11/19 0000  Discharge wound care:       Comments: Apply small piece of Xeroform gauze Kellie Simmering # 294)  to right leg wound just below the knee. Cover with 2x2 and wrap both legs from the toes to just below the knees with Ace wrap Kellie Simmering # [phone]). Unwrap both legs daily, assess legs and change xeroform dressing on right side   12/11/19 1221          Follow-up Information    Tanda Rockers, MD Follow up in 1 week(s).   Specialty: Pulmonary Disease Contact information: [place] [address] [phone]        Simona Huh, NP Follow up in 1 week(s).   Specialty: Nurse Practitioner Contact information: [place] [address] [phone]              Allergies  Allergen Reactions  . Sulfa Antibiotics Other (See Comments)    Mouth gets raw    Procedures/Studies: DG Lumbar Spine 2-3 Views  Result Date: 12/05/2019 CLINICAL DATA:  Worsening low back pain. EXAM:  LUMBAR SPINE - 2-3 VIEW COMPARISON:  CT lumbar spine dated September 08, 2019. FINDINGS: Five lumbar type vertebral bodies. No acute fracture or subluxation. Vertebral body heights are preserved. Alignment is normal. Unchanged moderate to severe disc height loss at L4-L5 and mild disc height loss at L1-L2 and L5-S1. Unchanged advanced lower lumbar facet arthropathy. Mildly dilated loops of small bowel in the central abdomen. Gas and stool throughout the colon. IMPRESSION: 1.  No acute osseous abnormality. 2. Unchanged multilevel lumbar spondylosis as described above, moderate to severe at L4-L5. 3. Mildly dilated loops of small bowel in the central abdomen could reflect ileus. Electronically Signed   By: Titus Dubin M.D.   On: 12/05/2019 11:03   DG Abd 1 View  Result Date: 12/05/2019 CLINICAL DATA:  Abdominal distention EXAM: ABDOMEN - 1 VIEW COMPARISON:  CT 09/08/2019 FINDINGS: Technique is limited due to body habitus. Gas and stool demonstrated throughout the colon with some gas-filled small bowel. No large or small bowel distention. Changes likely represent ileus. No radiopaque stones are identified. Degenerative changes in the spine and hips. IMPRESSION: Nonobstructive bowel gas pattern with probable ileus. Electronically Signed   By: Lucienne Capers M.D.   On: 12/05/2019 00:46   DG Chest Port 1 View  Result Date: 12/04/2019 CLINICAL DATA:  Shortness of breath. EXAM: PORTABLE CHEST 1 VIEW COMPARISON:  Chest x-ray 11/04/2019, CT chest 11/04/2019 FINDINGS: The heart size and mediastinal contours are within normal limits. Hilar vasculature prominence. Low lung volumes with bibasilar compressive changes. No pulmonary edema. No pleural effusion. No pneumothorax. No acute osseous abnormality. IMPRESSION: Mild pulmonary vascular congestion. Low lung volumes. Electronically Signed   By: Iven Finn M.D.   On: 12/04/2019 20:53    Subjective: No acute issues/events overnight  Discharge Exam: Vitals:    12/12/19 0816 12/12/19 0820  BP:    Pulse:    Resp:    Temp:    SpO2: 97% 97%   Vitals:   12/12/19 0500 12/12/19 0634 12/12/19 0816 12/12/19 0820  BP:  (!) 145/80    Pulse:  (!) 102    Resp:  18    Temp:  97.7 F (36.5  C)    TempSrc:      SpO2:  97% 97% 97%  Weight: 90.8 kg     Height:        General: Pt is alert, awake, not in acute distress Cardiovascular: RRR, S1/S2 +, no rubs, no gallops Respiratory: CTA bilaterally, no wheezing, no rhonchi Abdominal: Soft, NT, ND, bowel sounds + Extremities: no edema, no cyanosis   The results of significant diagnostics from this hospitalization (including imaging, microbiology, ancillary and laboratory) are listed below for reference.     Microbiology: Recent Results (from the past 240 hour(s))  Resp Panel by RT-PCR (Flu A&B, Covid) Nasopharyngeal Swab     Status: None   Collection Time: 12/04/19  9:20 PM   Specimen: Nasopharyngeal Swab; Nasopharyngeal(NP) swabs in vial transport medium  Result Value Ref Range Status   SARS Coronavirus 2 by RT PCR NEGATIVE NEGATIVE Final    Comment: (NOTE) SARS-CoV-2 target nucleic acids are NOT DETECTED.  The SARS-CoV-2 RNA is generally detectable in upper respiratory specimens during the acute phase of infection. The lowest concentration of SARS-CoV-2 viral copies this assay can detect is 138 copies/mL. A negative result does not preclude SARS-Cov-2 infection and should not be used as the sole basis for treatment or other patient management decisions. A negative result may occur with  improper specimen collection/handling, submission of specimen other than nasopharyngeal swab, presence of viral mutation(s) within the areas targeted by this assay, and inadequate number of viral copies(<138 copies/mL). A negative result must be combined with clinical observations, patient history, and epidemiological information. The expected result is Negative.  Fact Sheet for Patients:   EntrepreneurPulse.com.au  Fact Sheet for Healthcare Providers:  IncredibleEmployment.be  This test is no t yet approved or cleared by the [place] FDA and  has been authorized for detection and/or diagnosis of SARS-CoV-2 by FDA under an Emergency Use Authorization (EUA). This EUA will remain  in effect (meaning this test can be used) for the duration of the COVID-19 declaration under Section 564(b)(1) of the Act, 21 U.S.C.section 360bbb-3(b)(1), unless the authorization is terminated  or revoked sooner.       Influenza A by PCR NEGATIVE NEGATIVE Final   Influenza B by PCR NEGATIVE NEGATIVE Final    Comment: (NOTE) The Xpert Xpress SARS-CoV-2/FLU/RSV plus assay is intended as an aid in the diagnosis of influenza from Nasopharyngeal swab specimens and should not be used as a sole basis for treatment. Nasal washings and aspirates are unacceptable for Xpert Xpress SARS-CoV-2/FLU/RSV testing.  Fact Sheet for Patients: EntrepreneurPulse.com.au  Fact Sheet for Healthcare Providers: IncredibleEmployment.be  This test is not yet approved or cleared by the [place] FDA and has been authorized for detection and/or diagnosis of SARS-CoV-2 by FDA under an Emergency Use Authorization (EUA). This EUA will remain in effect (meaning this test can be used) for the duration of the COVID-19 declaration under Section 564(b)(1) of the Act, 21 U.S.C. section 360bbb-3(b)(1), unless the authorization is terminated or revoked.  Performed at [facility], [place] [address], [place], [place] [address]      Labs: BNP (last 3 results) Recent Labs    10/21/19 0941 11/04/19 0736 12/04/19 2027  BNP 55.9 45.4 44.0   Basic Metabolic Panel: Recent Labs  Lab 12/07/19 0642 12/08/19 0523 12/09/19 0603 12/10/19 0527  NA 141 138 138 137  K 3.8 3.9 4.2 4.3  CL 97* 96* 97* 96*  CO2 33* 31 33* 32   GLUCOSE 116* 125* 100* 94  BUN 17 17 18 15   CREATININE 0.60 0.64 0.64 0.63  CALCIUM 10.4* 10.5* 9.9 9.6  MG 2.3 2.2  --   --    Liver Function Tests: No results for input(s): AST, ALT, ALKPHOS, BILITOT, PROT, ALBUMIN in the last 168 hours. No results for input(s): LIPASE, AMYLASE in the last 168 hours. No results for input(s): AMMONIA in the last 168 hours. CBC: Recent Labs  Lab 12/07/19 0642 12/08/19 0523 12/09/19 0603 12/10/19 0527  WBC 12.7* 12.6* 9.1 9.9  NEUTROABS 9.6* 9.4*  --   --   HGB 12.7 12.9 12.2 11.9*  HCT 41.3 41.7 39.9 39.8  MCV 102.7* 102.2* 102.8* 104.2*  PLT 366 367 321 334   Cardiac Enzymes: No results for input(s): CKTOTAL, CKMB, CKMBINDEX, TROPONINI in the last 168 hours. BNP: Invalid input(s): POCBNP CBG: No results for input(s): GLUCAP in the last 168 hours. D-Dimer No results for input(s): DDIMER in the last 72 hours. Hgb A1c No results for input(s): HGBA1C in the last 72 hours. Lipid Profile No results for input(s): CHOL, HDL, LDLCALC, TRIG, CHOLHDL, LDLDIRECT in the last 72 hours. Thyroid function studies No results for input(s): TSH, T4TOTAL, T3FREE, THYROIDAB in the last 72 hours.  Invalid input(s): FREET3 Anemia work up No results for input(s): VITAMINB12, FOLATE, FERRITIN, TIBC, IRON, RETICCTPCT in the last 72 hours. Urinalysis    Component Value Date/Time   COLORURINE YELLOW 09/10/2019 1302   APPEARANCEUR CLEAR 09/10/2019 1302   LABSPEC 1.018 09/10/2019 1302   PHURINE 5.0 09/10/2019 1302   GLUCOSEU NEGATIVE 09/10/2019 1302   HGBUR NEGATIVE 09/10/2019 1302   BILIRUBINUR NEGATIVE 09/10/2019 1302   BILIRUBINUR n 09/23/2012 1111   KETONESUR NEGATIVE 09/10/2019 1302   PROTEINUR NEGATIVE 09/10/2019 1302   UROBILINOGEN 0.2 09/23/2012 1111   NITRITE NEGATIVE 09/10/2019 1302   LEUKOCYTESUR NEGATIVE 09/10/2019 1302   Sepsis Labs Invalid input(s): PROCALCITONIN,  WBC,  LACTICIDVEN Microbiology Recent Results (from the past 240  hour(s))  Resp Panel by RT-PCR (Flu A&B, Covid) Nasopharyngeal Swab     Status: None   Collection Time: 12/04/19  9:20 PM   Specimen: Nasopharyngeal Swab; Nasopharyngeal(NP) swabs in vial transport medium  Result Value Ref Range Status   SARS Coronavirus 2 by RT PCR NEGATIVE NEGATIVE Final    Comment: (NOTE) SARS-CoV-2 target nucleic acids are NOT DETECTED.  The SARS-CoV-2 RNA is generally detectable in upper respiratory specimens during the acute phase of infection. The lowest concentration of SARS-CoV-2 viral copies this assay can detect is 138 copies/mL. A negative result does not preclude SARS-Cov-2 infection and should not be used as the sole basis for treatment or other patient management decisions. A negative result may occur with  improper specimen collection/handling, submission of specimen other than nasopharyngeal swab, presence of viral mutation(s) within the areas targeted by this assay, and inadequate number of viral copies(<138 copies/mL). A negative result must be combined with clinical observations, patient history, and epidemiological information. The expected result is Negative.  Fact Sheet for Patients:  EntrepreneurPulse.com.au  Fact Sheet for Healthcare Providers:  IncredibleEmployment.be  This test is no t yet approved or cleared by the [place] FDA and  has been authorized for detection and/or diagnosis of SARS-CoV-2 by FDA under an Emergency Use Authorization (EUA). This EUA will remain  in effect (meaning this test can be used) for the duration of the COVID-19 declaration under Section 564(b)(1) of the Act, 21 U.S.C.section 360bbb-3(b)(1), unless the authorization is terminated  or revoked sooner.  Influenza A by PCR NEGATIVE NEGATIVE Final   Influenza B by PCR NEGATIVE NEGATIVE Final    Comment: (NOTE) The Xpert Xpress SARS-CoV-2/FLU/RSV plus assay is intended as an aid in the diagnosis of influenza from  Nasopharyngeal swab specimens and should not be used as a sole basis for treatment. Nasal washings and aspirates are unacceptable for Xpert Xpress SARS-CoV-2/FLU/RSV testing.  Fact Sheet for Patients: EntrepreneurPulse.com.au  Fact Sheet for Healthcare Providers: IncredibleEmployment.be  This test is not yet approved or cleared by the [place] FDA and has been authorized for detection and/or diagnosis of SARS-CoV-2 by FDA under an Emergency Use Authorization (EUA). This EUA will remain in effect (meaning this test can be used) for the duration of the COVID-19 declaration under Section 564(b)(1) of the Act, 21 U.S.C. section 360bbb-3(b)(1), unless the authorization is terminated or revoked.  Performed at [facility], [place] [address], [place], [place] [address]     Time coordinating discharge: Over 30 minutes  SIGNED:   Antonieta Pert, DO [facility] 12/12/2019, 10:30 AM Pager   If 7PM-7AM, please contact night-coverage [email or web address]

## 2019-12-11 NOTE — TOC Progression Note (Signed)
Transition of Care [facility]) - Progression Note    Patient Details  Name: SOO STEELMAN MRN: [id number] Date of Birth: Aug 29, 1950  [facility]) CM/SW Contact  Leeroy Cha, RN Phone Number: 12/11/2019, 2:23 PM  Clinical Narrative:    Patient dcd tct-kathy campbell to see if insurance Josem Kaufmann is approved.  Juliann Pulse wcb.    Expected Discharge Plan: [place] Barriers to Discharge: Continued Medical Work up  Expected Discharge Plan and Services Expected Discharge Plan: [place]   Discharge Planning Services: CM Consult Post Acute Care Choice: [place] Living arrangements for the past 2 months: [place] Expected Discharge Date: 12/11/19                                     Social Determinants of Health (SDOH) Interventions    Readmission Risk Interventions No flowsheet data found.

## 2019-12-12 DIAGNOSIS — I5032 Chronic diastolic (congestive) heart failure: Secondary | ICD-10-CM | POA: Diagnosis not present

## 2019-12-12 DIAGNOSIS — I10 Essential (primary) hypertension: Secondary | ICD-10-CM | POA: Diagnosis not present

## 2019-12-12 DIAGNOSIS — J4550 Severe persistent asthma, uncomplicated: Secondary | ICD-10-CM | POA: Diagnosis not present

## 2019-12-12 DIAGNOSIS — Z9981 Dependence on supplemental oxygen: Secondary | ICD-10-CM | POA: Diagnosis not present

## 2019-12-12 DIAGNOSIS — I700 Atherosclerosis of aorta: Secondary | ICD-10-CM | POA: Diagnosis not present

## 2019-12-12 DIAGNOSIS — F410 Panic disorder [episodic paroxysmal anxiety] without agoraphobia: Secondary | ICD-10-CM | POA: Diagnosis not present

## 2019-12-12 DIAGNOSIS — M6281 Muscle weakness (generalized): Secondary | ICD-10-CM | POA: Diagnosis not present

## 2019-12-12 DIAGNOSIS — R059 Cough, unspecified: Secondary | ICD-10-CM | POA: Diagnosis not present

## 2019-12-12 DIAGNOSIS — J189 Pneumonia, unspecified organism: Secondary | ICD-10-CM | POA: Diagnosis not present

## 2019-12-12 DIAGNOSIS — S22000D Wedge compression fracture of unspecified thoracic vertebra, subsequent encounter for fracture with routine healing: Secondary | ICD-10-CM | POA: Diagnosis not present

## 2019-12-12 DIAGNOSIS — M545 Low back pain, unspecified: Secondary | ICD-10-CM | POA: Diagnosis not present

## 2019-12-12 DIAGNOSIS — J9602 Acute respiratory failure with hypercapnia: Secondary | ICD-10-CM | POA: Diagnosis not present

## 2019-12-12 DIAGNOSIS — J45901 Unspecified asthma with (acute) exacerbation: Secondary | ICD-10-CM | POA: Diagnosis not present

## 2019-12-12 DIAGNOSIS — J9601 Acute respiratory failure with hypoxia: Secondary | ICD-10-CM | POA: Diagnosis not present

## 2019-12-12 DIAGNOSIS — I2699 Other pulmonary embolism without acute cor pulmonale: Secondary | ICD-10-CM | POA: Diagnosis not present

## 2019-12-12 DIAGNOSIS — Z741 Need for assistance with personal care: Secondary | ICD-10-CM | POA: Diagnosis not present

## 2019-12-12 DIAGNOSIS — R262 Difficulty in walking, not elsewhere classified: Secondary | ICD-10-CM | POA: Diagnosis not present

## 2019-12-12 DIAGNOSIS — J9611 Chronic respiratory failure with hypoxia: Secondary | ICD-10-CM | POA: Diagnosis not present

## 2019-12-12 DIAGNOSIS — R600 Localized edema: Secondary | ICD-10-CM | POA: Diagnosis not present

## 2019-12-12 DIAGNOSIS — D519 Vitamin B12 deficiency anemia, unspecified: Secondary | ICD-10-CM | POA: Diagnosis not present

## 2019-12-12 DIAGNOSIS — J449 Chronic obstructive pulmonary disease, unspecified: Secondary | ICD-10-CM | POA: Diagnosis not present

## 2019-12-12 DIAGNOSIS — J9622 Acute and chronic respiratory failure with hypercapnia: Secondary | ICD-10-CM | POA: Diagnosis not present

## 2019-12-12 DIAGNOSIS — J9621 Acute and chronic respiratory failure with hypoxia: Secondary | ICD-10-CM | POA: Diagnosis not present

## 2019-12-12 DIAGNOSIS — R0989 Other specified symptoms and signs involving the circulatory and respiratory systems: Secondary | ICD-10-CM | POA: Diagnosis not present

## 2019-12-12 DIAGNOSIS — R058 Other specified cough: Secondary | ICD-10-CM | POA: Diagnosis not present

## 2019-12-12 DIAGNOSIS — R5381 Other malaise: Secondary | ICD-10-CM | POA: Diagnosis not present

## 2019-12-12 DIAGNOSIS — F339 Major depressive disorder, recurrent, unspecified: Secondary | ICD-10-CM | POA: Diagnosis not present

## 2019-12-12 DIAGNOSIS — J441 Chronic obstructive pulmonary disease with (acute) exacerbation: Secondary | ICD-10-CM | POA: Diagnosis not present

## 2019-12-12 DIAGNOSIS — F411 Generalized anxiety disorder: Secondary | ICD-10-CM | POA: Diagnosis not present

## 2019-12-12 DIAGNOSIS — F988 Other specified behavioral and emotional disorders with onset usually occurring in childhood and adolescence: Secondary | ICD-10-CM | POA: Diagnosis not present

## 2019-12-12 DIAGNOSIS — Z7409 Other reduced mobility: Secondary | ICD-10-CM | POA: Diagnosis not present

## 2019-12-12 DIAGNOSIS — E785 Hyperlipidemia, unspecified: Secondary | ICD-10-CM | POA: Diagnosis not present

## 2019-12-12 DIAGNOSIS — G894 Chronic pain syndrome: Secondary | ICD-10-CM | POA: Diagnosis not present

## 2019-12-12 DIAGNOSIS — M546 Pain in thoracic spine: Secondary | ICD-10-CM | POA: Diagnosis not present

## 2019-12-12 DIAGNOSIS — K219 Gastro-esophageal reflux disease without esophagitis: Secondary | ICD-10-CM | POA: Diagnosis not present

## 2019-12-12 DIAGNOSIS — I5033 Acute on chronic diastolic (congestive) heart failure: Secondary | ICD-10-CM | POA: Diagnosis not present

## 2019-12-12 LAB — RESP PANEL BY RT-PCR (FLU A&B, COVID) ARPGX2
Influenza A by PCR: NEGATIVE
Influenza B by PCR: NEGATIVE
SARS Coronavirus 2 by RT PCR: NEGATIVE

## 2019-12-12 MED ORDER — APIXABAN 5 MG PO TABS: 5.0000 mg | ORAL_TABLET | Freq: Two times a day (BID) | ORAL | Status: AC

## 2019-12-12 MED ORDER — HYDROCODONE-ACETAMINOPHEN 5-325 MG PO TABS: 1.0000 | ORAL_TABLET | Freq: Four times a day (QID) | ORAL | 0 refills | Status: DC | PRN

## 2019-12-12 MED ORDER — PREDNISONE 10 MG PO TABS
10.0000 mg | ORAL_TABLET | Freq: Every day | ORAL | Status: DC
Start: 2019-12-13 — End: 2020-04-01

## 2019-12-12 MED ORDER — FENTANYL CITRATE (PF) 100 MCG/2ML IJ SOLN
12.5000 ug | Freq: Once | INTRAMUSCULAR | Status: AC
  Administered 2019-12-12: 02:00:00 12.5 ug via INTRAVENOUS
  Filled 2019-12-12: qty 2

## 2019-12-12 NOTE — Progress Notes (Signed)
OT Cancellation Note  Patient Details Name: Julia Daniels MRN: [id number] DOB: 12/01/1950   Cancelled Treatment:    Reason Eval/Treat Not Completed: Pain limiting ability to participate. Patient requesting pain meds, RN made aware. Will check back as schedule permits.  Delbert Phenix OT OT pager: [place] 12/12/2019, 12:19 PM

## 2019-12-12 NOTE — TOC Progression Note (Signed)
Transition of Care [facility]) - Progression Note    Patient Details  Name: Julia Daniels MRN: [id number] Date of Birth: 31-Aug-1950  Transition of Care [facility]) CM/SW Contact  Leeroy Cha, RN Phone Number: 12/12/2019, 12:51 PM  Clinical Narrative:    Rapid covid test requested for snf requirment. Has a bed at [facility] with most form to floor rn. ptar tbc once covid test is back bcbs has [place]. Transfer to snf.   Expected Discharge Plan: [place] Barriers to Discharge: Continued Medical Work up  Expected Discharge Plan and Services Expected Discharge Plan: [place]   Discharge Planning Services: CM Consult Post Acute Care Choice: [place] Living arrangements for the past 2 months: [place] Expected Discharge Date: 12/11/19                                     Social Determinants of Health (SDOH) Interventions    Readmission Risk Interventions No flowsheet data found.

## 2019-12-12 NOTE — Progress Notes (Signed)
PTAR is here  to tx the pt to [facility] , report given to [facility] RN who will resume care, the pt has all of her belongings at this time.

## 2019-12-12 NOTE — Progress Notes (Signed)
Patient seen and examined.  She is on baseline home oxygen setting, no shortness of breath.  Has ongoing chronic back issues. No acute events overnight. At this point is medically stable discharge to skilled nursing facility. I have updated her discharge summary/med rec, patient reported that she is supposed to be on prednisone 10 mg daily and has been resumed. At this time she is waiting for skilled nursing facility and insurance authorization

## 2019-12-15 DIAGNOSIS — S22000D Wedge compression fracture of unspecified thoracic vertebra, subsequent encounter for fracture with routine healing: Secondary | ICD-10-CM | POA: Diagnosis not present

## 2019-12-15 DIAGNOSIS — F410 Panic disorder [episodic paroxysmal anxiety] without agoraphobia: Secondary | ICD-10-CM | POA: Diagnosis not present

## 2019-12-15 DIAGNOSIS — M546 Pain in thoracic spine: Secondary | ICD-10-CM | POA: Diagnosis not present

## 2019-12-15 DIAGNOSIS — G894 Chronic pain syndrome: Secondary | ICD-10-CM | POA: Diagnosis not present

## 2019-12-16 ENCOUNTER — Ambulatory Visit: Payer: BC Managed Care – PPO | Admitting: Adult Health

## 2019-12-16 DIAGNOSIS — I5033 Acute on chronic diastolic (congestive) heart failure: Secondary | ICD-10-CM | POA: Diagnosis not present

## 2019-12-16 DIAGNOSIS — I10 Essential (primary) hypertension: Secondary | ICD-10-CM | POA: Diagnosis not present

## 2019-12-16 DIAGNOSIS — J449 Chronic obstructive pulmonary disease, unspecified: Secondary | ICD-10-CM | POA: Diagnosis not present

## 2019-12-16 DIAGNOSIS — J9621 Acute and chronic respiratory failure with hypoxia: Secondary | ICD-10-CM | POA: Diagnosis not present

## 2019-12-18 DIAGNOSIS — M546 Pain in thoracic spine: Secondary | ICD-10-CM | POA: Diagnosis not present

## 2019-12-18 DIAGNOSIS — G894 Chronic pain syndrome: Secondary | ICD-10-CM | POA: Diagnosis not present

## 2019-12-18 DIAGNOSIS — F411 Generalized anxiety disorder: Secondary | ICD-10-CM | POA: Diagnosis not present

## 2019-12-18 DIAGNOSIS — S22000D Wedge compression fracture of unspecified thoracic vertebra, subsequent encounter for fracture with routine healing: Secondary | ICD-10-CM | POA: Diagnosis not present

## 2019-12-29 ENCOUNTER — Telehealth: Payer: Self-pay | Admitting: Internal Medicine

## 2019-12-29 NOTE — Telephone Encounter (Signed)
[name] Order: 30mg  #1 prefilled syringe Ordered date: 12/29/19 Expected date of arrival: 01/06/20 Ordered by: [place]: [facility]  Insurance verification and shipment scheduled 01/06/20

## 2019-12-30 DIAGNOSIS — M6281 Muscle weakness (generalized): Secondary | ICD-10-CM | POA: Diagnosis not present

## 2019-12-30 DIAGNOSIS — R262 Difficulty in walking, not elsewhere classified: Secondary | ICD-10-CM | POA: Diagnosis not present

## 2019-12-30 DIAGNOSIS — R5381 Other malaise: Secondary | ICD-10-CM | POA: Diagnosis not present

## 2020-01-05 DIAGNOSIS — J4550 Severe persistent asthma, uncomplicated: Secondary | ICD-10-CM | POA: Diagnosis not present

## 2020-01-06 NOTE — Telephone Encounter (Signed)
[facility] Shipment Received:  30mg  #1 prefilled syringe Medication arrival date: 01/06/20 Lot #: [id number] Exp date: 02/08/2021 Received by: 02/10/2021

## 2020-01-07 DIAGNOSIS — G894 Chronic pain syndrome: Secondary | ICD-10-CM | POA: Diagnosis not present

## 2020-01-07 DIAGNOSIS — F411 Generalized anxiety disorder: Secondary | ICD-10-CM | POA: Diagnosis not present

## 2020-01-07 DIAGNOSIS — M545 Low back pain, unspecified: Secondary | ICD-10-CM | POA: Diagnosis not present

## 2020-01-07 DIAGNOSIS — S22000D Wedge compression fracture of unspecified thoracic vertebra, subsequent encounter for fracture with routine healing: Secondary | ICD-10-CM | POA: Diagnosis not present

## 2020-01-12 DIAGNOSIS — R5381 Other malaise: Secondary | ICD-10-CM | POA: Diagnosis not present

## 2020-01-12 DIAGNOSIS — M6281 Muscle weakness (generalized): Secondary | ICD-10-CM | POA: Diagnosis not present

## 2020-01-12 DIAGNOSIS — M545 Low back pain, unspecified: Secondary | ICD-10-CM | POA: Diagnosis not present

## 2020-01-12 DIAGNOSIS — J441 Chronic obstructive pulmonary disease with (acute) exacerbation: Secondary | ICD-10-CM | POA: Diagnosis not present

## 2020-01-15 ENCOUNTER — Ambulatory Visit (INDEPENDENT_AMBULATORY_CARE_PROVIDER_SITE_OTHER): Payer: BC Managed Care – PPO

## 2020-01-15 ENCOUNTER — Other Ambulatory Visit: Payer: Self-pay

## 2020-01-15 DIAGNOSIS — J4550 Severe persistent asthma, uncomplicated: Secondary | ICD-10-CM

## 2020-01-15 MED ORDER — BENRALIZUMAB 30 MG/ML ~~LOC~~ SOSY
30.0000 mg | PREFILLED_SYRINGE | Freq: Once | SUBCUTANEOUS | Status: AC
  Administered 2020-01-15: 30 mg via SUBCUTANEOUS

## 2020-01-15 NOTE — Progress Notes (Signed)
Have you been hospitalized within the last 10 days?  No Do you have a fever?  No Do you have a cough?  No Do you have a headache or sore throat? No Do you have your Epi Pen visible and is it within date?  Yes   Patient stated she had some head congestion, but was checked by facility and advised to keep [facility] appointment. Patient stated she had cxr 01/14/20 that was clear. Follow up appointment scheduled per Patient request with Dr. Sherene Sires 02/23/20.

## 2020-01-16 DIAGNOSIS — J189 Pneumonia, unspecified organism: Secondary | ICD-10-CM | POA: Diagnosis not present

## 2020-01-16 DIAGNOSIS — J449 Chronic obstructive pulmonary disease, unspecified: Secondary | ICD-10-CM | POA: Diagnosis not present

## 2020-01-16 DIAGNOSIS — R0989 Other specified symptoms and signs involving the circulatory and respiratory systems: Secondary | ICD-10-CM | POA: Diagnosis not present

## 2020-01-16 DIAGNOSIS — R059 Cough, unspecified: Secondary | ICD-10-CM | POA: Diagnosis not present

## 2020-01-17 ENCOUNTER — Telehealth: Payer: Self-pay | Admitting: Internal Medicine

## 2020-01-17 NOTE — Telephone Encounter (Signed)
Rec'd faxed request from [employer] for last 2 ov notes and return to work status. Faxed information request to [facility] at [phone] attn: Fredrich Birks. -pr

## 2020-01-22 DIAGNOSIS — J449 Chronic obstructive pulmonary disease, unspecified: Secondary | ICD-10-CM | POA: Diagnosis not present

## 2020-01-22 DIAGNOSIS — J9611 Chronic respiratory failure with hypoxia: Secondary | ICD-10-CM | POA: Diagnosis not present

## 2020-01-22 DIAGNOSIS — F411 Generalized anxiety disorder: Secondary | ICD-10-CM | POA: Diagnosis not present

## 2020-01-22 DIAGNOSIS — R600 Localized edema: Secondary | ICD-10-CM | POA: Diagnosis not present

## 2020-01-23 DIAGNOSIS — F411 Generalized anxiety disorder: Secondary | ICD-10-CM | POA: Diagnosis not present

## 2020-01-23 DIAGNOSIS — J449 Chronic obstructive pulmonary disease, unspecified: Secondary | ICD-10-CM | POA: Diagnosis not present

## 2020-01-23 DIAGNOSIS — J189 Pneumonia, unspecified organism: Secondary | ICD-10-CM | POA: Diagnosis not present

## 2020-01-23 DIAGNOSIS — R058 Other specified cough: Secondary | ICD-10-CM | POA: Diagnosis not present

## 2020-01-28 DIAGNOSIS — J9611 Chronic respiratory failure with hypoxia: Secondary | ICD-10-CM | POA: Diagnosis not present

## 2020-01-28 DIAGNOSIS — R5381 Other malaise: Secondary | ICD-10-CM | POA: Diagnosis not present

## 2020-01-28 DIAGNOSIS — M6281 Muscle weakness (generalized): Secondary | ICD-10-CM | POA: Diagnosis not present

## 2020-01-28 DIAGNOSIS — J449 Chronic obstructive pulmonary disease, unspecified: Secondary | ICD-10-CM | POA: Diagnosis not present

## 2020-02-03 DIAGNOSIS — R5381 Other malaise: Secondary | ICD-10-CM | POA: Diagnosis not present

## 2020-02-03 DIAGNOSIS — M6281 Muscle weakness (generalized): Secondary | ICD-10-CM | POA: Diagnosis not present

## 2020-02-03 DIAGNOSIS — J449 Chronic obstructive pulmonary disease, unspecified: Secondary | ICD-10-CM | POA: Diagnosis not present

## 2020-02-03 DIAGNOSIS — J9621 Acute and chronic respiratory failure with hypoxia: Secondary | ICD-10-CM | POA: Diagnosis not present

## 2020-02-05 DIAGNOSIS — J9621 Acute and chronic respiratory failure with hypoxia: Secondary | ICD-10-CM | POA: Diagnosis not present

## 2020-02-05 DIAGNOSIS — J441 Chronic obstructive pulmonary disease with (acute) exacerbation: Secondary | ICD-10-CM | POA: Diagnosis not present

## 2020-02-05 DIAGNOSIS — F410 Panic disorder [episodic paroxysmal anxiety] without agoraphobia: Secondary | ICD-10-CM | POA: Diagnosis not present

## 2020-02-05 DIAGNOSIS — F411 Generalized anxiety disorder: Secondary | ICD-10-CM | POA: Diagnosis not present

## 2020-02-09 ENCOUNTER — Other Ambulatory Visit: Payer: Self-pay | Admitting: Internal Medicine

## 2020-02-09 DIAGNOSIS — J9621 Acute and chronic respiratory failure with hypoxia: Secondary | ICD-10-CM | POA: Diagnosis not present

## 2020-02-09 DIAGNOSIS — J9622 Acute and chronic respiratory failure with hypercapnia: Secondary | ICD-10-CM | POA: Diagnosis not present

## 2020-02-09 DIAGNOSIS — J449 Chronic obstructive pulmonary disease, unspecified: Secondary | ICD-10-CM | POA: Diagnosis not present

## 2020-02-09 DIAGNOSIS — R279 Unspecified lack of coordination: Secondary | ICD-10-CM | POA: Diagnosis not present

## 2020-02-09 DIAGNOSIS — R5381 Other malaise: Secondary | ICD-10-CM | POA: Diagnosis not present

## 2020-02-09 DIAGNOSIS — J45901 Unspecified asthma with (acute) exacerbation: Secondary | ICD-10-CM | POA: Diagnosis not present

## 2020-02-09 DIAGNOSIS — Z743 Need for continuous supervision: Secondary | ICD-10-CM | POA: Diagnosis not present

## 2020-02-10 DIAGNOSIS — S22000D Wedge compression fracture of unspecified thoracic vertebra, subsequent encounter for fracture with routine healing: Secondary | ICD-10-CM | POA: Diagnosis not present

## 2020-02-10 DIAGNOSIS — M6281 Muscle weakness (generalized): Secondary | ICD-10-CM | POA: Diagnosis not present

## 2020-02-10 DIAGNOSIS — J449 Chronic obstructive pulmonary disease, unspecified: Secondary | ICD-10-CM | POA: Diagnosis not present

## 2020-02-10 DIAGNOSIS — J9621 Acute and chronic respiratory failure with hypoxia: Secondary | ICD-10-CM | POA: Diagnosis not present

## 2020-02-11 DIAGNOSIS — E785 Hyperlipidemia, unspecified: Secondary | ICD-10-CM | POA: Diagnosis not present

## 2020-02-11 DIAGNOSIS — Z7952 Long term (current) use of systemic steroids: Secondary | ICD-10-CM | POA: Diagnosis not present

## 2020-02-11 DIAGNOSIS — F319 Bipolar disorder, unspecified: Secondary | ICD-10-CM | POA: Diagnosis not present

## 2020-02-11 DIAGNOSIS — K219 Gastro-esophageal reflux disease without esophagitis: Secondary | ICD-10-CM | POA: Diagnosis not present

## 2020-02-11 DIAGNOSIS — M549 Dorsalgia, unspecified: Secondary | ICD-10-CM | POA: Diagnosis not present

## 2020-02-11 DIAGNOSIS — F410 Panic disorder [episodic paroxysmal anxiety] without agoraphobia: Secondary | ICD-10-CM | POA: Diagnosis not present

## 2020-02-11 DIAGNOSIS — J9611 Chronic respiratory failure with hypoxia: Secondary | ICD-10-CM | POA: Diagnosis not present

## 2020-02-11 DIAGNOSIS — Z7901 Long term (current) use of anticoagulants: Secondary | ICD-10-CM | POA: Diagnosis not present

## 2020-02-11 DIAGNOSIS — J449 Chronic obstructive pulmonary disease, unspecified: Secondary | ICD-10-CM | POA: Diagnosis not present

## 2020-02-11 DIAGNOSIS — Z86711 Personal history of pulmonary embolism: Secondary | ICD-10-CM | POA: Diagnosis not present

## 2020-02-11 DIAGNOSIS — J9612 Chronic respiratory failure with hypercapnia: Secondary | ICD-10-CM | POA: Diagnosis not present

## 2020-02-11 DIAGNOSIS — Z6836 Body mass index (BMI) 36.0-36.9, adult: Secondary | ICD-10-CM | POA: Diagnosis not present

## 2020-02-11 DIAGNOSIS — E669 Obesity, unspecified: Secondary | ICD-10-CM | POA: Diagnosis not present

## 2020-02-11 DIAGNOSIS — I110 Hypertensive heart disease with heart failure: Secondary | ICD-10-CM | POA: Diagnosis not present

## 2020-02-11 DIAGNOSIS — G8929 Other chronic pain: Secondary | ICD-10-CM | POA: Diagnosis not present

## 2020-02-11 DIAGNOSIS — I5032 Chronic diastolic (congestive) heart failure: Secondary | ICD-10-CM | POA: Diagnosis not present

## 2020-02-13 ENCOUNTER — Telehealth: Payer: Self-pay | Admitting: Internal Medicine

## 2020-02-13 NOTE — Telephone Encounter (Signed)
MW please advise if ok to change 2/14 rov to a televisit at patient's request.  Thanks!

## 2020-02-15 NOTE — Telephone Encounter (Signed)
Sorry to hear that, fine to change to televisit

## 2020-02-16 DIAGNOSIS — Z7952 Long term (current) use of systemic steroids: Secondary | ICD-10-CM | POA: Diagnosis not present

## 2020-02-16 DIAGNOSIS — M549 Dorsalgia, unspecified: Secondary | ICD-10-CM | POA: Diagnosis not present

## 2020-02-16 DIAGNOSIS — I110 Hypertensive heart disease with heart failure: Secondary | ICD-10-CM | POA: Diagnosis not present

## 2020-02-16 DIAGNOSIS — J9611 Chronic respiratory failure with hypoxia: Secondary | ICD-10-CM | POA: Diagnosis not present

## 2020-02-16 DIAGNOSIS — E669 Obesity, unspecified: Secondary | ICD-10-CM | POA: Diagnosis not present

## 2020-02-16 DIAGNOSIS — Z86711 Personal history of pulmonary embolism: Secondary | ICD-10-CM | POA: Diagnosis not present

## 2020-02-16 DIAGNOSIS — J9612 Chronic respiratory failure with hypercapnia: Secondary | ICD-10-CM | POA: Diagnosis not present

## 2020-02-16 DIAGNOSIS — Z7901 Long term (current) use of anticoagulants: Secondary | ICD-10-CM | POA: Diagnosis not present

## 2020-02-16 DIAGNOSIS — G8929 Other chronic pain: Secondary | ICD-10-CM | POA: Diagnosis not present

## 2020-02-16 DIAGNOSIS — F410 Panic disorder [episodic paroxysmal anxiety] without agoraphobia: Secondary | ICD-10-CM | POA: Diagnosis not present

## 2020-02-16 DIAGNOSIS — K219 Gastro-esophageal reflux disease without esophagitis: Secondary | ICD-10-CM | POA: Diagnosis not present

## 2020-02-16 DIAGNOSIS — J449 Chronic obstructive pulmonary disease, unspecified: Secondary | ICD-10-CM | POA: Diagnosis not present

## 2020-02-16 DIAGNOSIS — I5032 Chronic diastolic (congestive) heart failure: Secondary | ICD-10-CM | POA: Diagnosis not present

## 2020-02-16 DIAGNOSIS — E785 Hyperlipidemia, unspecified: Secondary | ICD-10-CM | POA: Diagnosis not present

## 2020-02-16 DIAGNOSIS — Z6836 Body mass index (BMI) 36.0-36.9, adult: Secondary | ICD-10-CM | POA: Diagnosis not present

## 2020-02-16 DIAGNOSIS — F319 Bipolar disorder, unspecified: Secondary | ICD-10-CM | POA: Diagnosis not present

## 2020-02-16 NOTE — Telephone Encounter (Signed)
Called and spoke with patient to let her know that Dr. Melvyn Novas is ok with her changing her appt on 2/14 to Televisit. She expressed understanding. This has been changed. Nothing further needed at this time.

## 2020-02-17 DIAGNOSIS — F410 Panic disorder [episodic paroxysmal anxiety] without agoraphobia: Secondary | ICD-10-CM | POA: Diagnosis not present

## 2020-02-17 DIAGNOSIS — G8929 Other chronic pain: Secondary | ICD-10-CM | POA: Diagnosis not present

## 2020-02-17 DIAGNOSIS — J9612 Chronic respiratory failure with hypercapnia: Secondary | ICD-10-CM | POA: Diagnosis not present

## 2020-02-17 DIAGNOSIS — K219 Gastro-esophageal reflux disease without esophagitis: Secondary | ICD-10-CM | POA: Diagnosis not present

## 2020-02-17 DIAGNOSIS — I110 Hypertensive heart disease with heart failure: Secondary | ICD-10-CM | POA: Diagnosis not present

## 2020-02-17 DIAGNOSIS — M549 Dorsalgia, unspecified: Secondary | ICD-10-CM | POA: Diagnosis not present

## 2020-02-17 DIAGNOSIS — Z6836 Body mass index (BMI) 36.0-36.9, adult: Secondary | ICD-10-CM | POA: Diagnosis not present

## 2020-02-17 DIAGNOSIS — J9611 Chronic respiratory failure with hypoxia: Secondary | ICD-10-CM | POA: Diagnosis not present

## 2020-02-17 DIAGNOSIS — F319 Bipolar disorder, unspecified: Secondary | ICD-10-CM | POA: Diagnosis not present

## 2020-02-17 DIAGNOSIS — E669 Obesity, unspecified: Secondary | ICD-10-CM | POA: Diagnosis not present

## 2020-02-17 DIAGNOSIS — J449 Chronic obstructive pulmonary disease, unspecified: Secondary | ICD-10-CM | POA: Diagnosis not present

## 2020-02-17 DIAGNOSIS — E785 Hyperlipidemia, unspecified: Secondary | ICD-10-CM | POA: Diagnosis not present

## 2020-02-17 DIAGNOSIS — Z7952 Long term (current) use of systemic steroids: Secondary | ICD-10-CM | POA: Diagnosis not present

## 2020-02-17 DIAGNOSIS — Z7901 Long term (current) use of anticoagulants: Secondary | ICD-10-CM | POA: Diagnosis not present

## 2020-02-17 DIAGNOSIS — Z86711 Personal history of pulmonary embolism: Secondary | ICD-10-CM | POA: Diagnosis not present

## 2020-02-17 DIAGNOSIS — I5032 Chronic diastolic (congestive) heart failure: Secondary | ICD-10-CM | POA: Diagnosis not present

## 2020-02-21 DIAGNOSIS — I5032 Chronic diastolic (congestive) heart failure: Secondary | ICD-10-CM | POA: Diagnosis not present

## 2020-02-21 DIAGNOSIS — E669 Obesity, unspecified: Secondary | ICD-10-CM | POA: Diagnosis not present

## 2020-02-21 DIAGNOSIS — M549 Dorsalgia, unspecified: Secondary | ICD-10-CM | POA: Diagnosis not present

## 2020-02-21 DIAGNOSIS — Z7901 Long term (current) use of anticoagulants: Secondary | ICD-10-CM | POA: Diagnosis not present

## 2020-02-21 DIAGNOSIS — I110 Hypertensive heart disease with heart failure: Secondary | ICD-10-CM | POA: Diagnosis not present

## 2020-02-21 DIAGNOSIS — Z86711 Personal history of pulmonary embolism: Secondary | ICD-10-CM | POA: Diagnosis not present

## 2020-02-21 DIAGNOSIS — Z7952 Long term (current) use of systemic steroids: Secondary | ICD-10-CM | POA: Diagnosis not present

## 2020-02-21 DIAGNOSIS — J9611 Chronic respiratory failure with hypoxia: Secondary | ICD-10-CM | POA: Diagnosis not present

## 2020-02-21 DIAGNOSIS — Z6836 Body mass index (BMI) 36.0-36.9, adult: Secondary | ICD-10-CM | POA: Diagnosis not present

## 2020-02-21 DIAGNOSIS — F410 Panic disorder [episodic paroxysmal anxiety] without agoraphobia: Secondary | ICD-10-CM | POA: Diagnosis not present

## 2020-02-21 DIAGNOSIS — F319 Bipolar disorder, unspecified: Secondary | ICD-10-CM | POA: Diagnosis not present

## 2020-02-21 DIAGNOSIS — G8929 Other chronic pain: Secondary | ICD-10-CM | POA: Diagnosis not present

## 2020-02-21 DIAGNOSIS — J449 Chronic obstructive pulmonary disease, unspecified: Secondary | ICD-10-CM | POA: Diagnosis not present

## 2020-02-21 DIAGNOSIS — K219 Gastro-esophageal reflux disease without esophagitis: Secondary | ICD-10-CM | POA: Diagnosis not present

## 2020-02-21 DIAGNOSIS — J9612 Chronic respiratory failure with hypercapnia: Secondary | ICD-10-CM | POA: Diagnosis not present

## 2020-02-21 DIAGNOSIS — E785 Hyperlipidemia, unspecified: Secondary | ICD-10-CM | POA: Diagnosis not present

## 2020-02-23 ENCOUNTER — Ambulatory Visit (INDEPENDENT_AMBULATORY_CARE_PROVIDER_SITE_OTHER): Payer: BC Managed Care – PPO | Admitting: Internal Medicine

## 2020-02-23 ENCOUNTER — Telehealth: Payer: Self-pay | Admitting: Internal Medicine

## 2020-02-23 ENCOUNTER — Encounter: Payer: Self-pay | Admitting: Internal Medicine

## 2020-02-23 ENCOUNTER — Other Ambulatory Visit: Payer: Self-pay

## 2020-02-23 DIAGNOSIS — J9611 Chronic respiratory failure with hypoxia: Secondary | ICD-10-CM

## 2020-02-23 DIAGNOSIS — J9612 Chronic respiratory failure with hypercapnia: Secondary | ICD-10-CM | POA: Diagnosis not present

## 2020-02-23 DIAGNOSIS — J449 Chronic obstructive pulmonary disease, unspecified: Secondary | ICD-10-CM

## 2020-02-23 MED ORDER — FAMOTIDINE 20 MG PO TABS: 20.0000 mg | ORAL_TABLET | Freq: Every day | ORAL | 11 refills | Status: DC

## 2020-02-23 MED ORDER — FAMOTIDINE 20 MG PO TABS: ORAL_TABLET | ORAL | 3 refills | Status: AC

## 2020-02-23 MED ORDER — MONTELUKAST SODIUM 10 MG PO TABS
10.0000 mg | ORAL_TABLET | Freq: Every day | ORAL | 11 refills | Status: DC
Start: 2020-02-23 — End: 2020-02-23

## 2020-02-23 MED ORDER — MONTELUKAST SODIUM 10 MG PO TABS: 10.0000 mg | ORAL_TABLET | Freq: Every day | ORAL | 11 refills | Status: DC

## 2020-02-23 NOTE — Assessment & Plan Note (Signed)
HC03   11/06/19  = 37   Adequate control on present rx, reviewed in detail with pt > no change in rx needed  = 2pm   Advised: Make sure you check your oxygen saturation  at your highest level of activity  to be sure it stays over 90% and adjust  02 flow upward to maintain this level if needed but remember to turn it back to previous settings when you stop (to conserve your supply).    F/u in 4 weeks with televisit if can't make it in  Each maintenance medication was reviewed in detail including most importantly the difference between maintenance and as needed and under what circumstances the prns are to be used.  Please see AVS for specific  Instructions which are unique to this visit and I personally typed out  which were reviewed in detail over the phone with the patient and a copy provided via Mychart

## 2020-02-23 NOTE — Patient Instructions (Addendum)
Plan A = Automatic = Always=    Stiolto 2 puffs  First thing in am and then budesonide 0.5 per nebulizer   Late:  Add pepcid 20 mg after supper   Plan B = Backup (to supplement plan A, not to replace it) Only use your albuterol inhaler  as a rescue medication to be used if you can't catch your breath by resting or doing a relaxed purse lip breathing pattern.  - The less you use it, the better it will work when you need it. - Ok to use the inhaler up to 2 puffs  every 4 hours if you must but call for appointment if use goes up over your usual need - Don't leave home without it !!  (think of it like the spare tire for your car)   Plan C = Crisis (instead of Plan B but only if Plan B stops working) - only use your albuterol nebulizer if you first try Plan B and it fails to help > ok to use the nebulizer up to every 4 hours but if start needing it regularly call for immediate appointment   Plan D = Deltasone = Prednisone  Double the daily dose if you are having to use more frequent plan C     Plan E = ER - go to ER or call 911 if all else fails     Please schedule a follow up office visit in 4 weeks, sooner if needed  - televisit is fine.

## 2020-02-23 NOTE — Assessment & Plan Note (Signed)
Quit smoking 08/2017  Spirometry 10/31/2017  FEV1 0.7 (30%)  Ratio 51 p Breo in am   - 10/31/2017  Eos = 0.9  - alpha one AT screen 10/31/2017   MS level 121  - 10/31/2017   Walked RA x one lap @ 185 stopped due to sob s desats at fast pace   - 01/18/2018   Walked RA  2 laps @ 261ft each @ fast  pace  stopped due to sob no desat  - Spirometry 01/18/2018  FEV1 1.1 (49%)  Ratio 63 with mild curvature after am spiriva and symbicort  - 02/11/2018    added prednisone as plan D / changed spiriva to smi - PFT's 03/06/18  FEV1 1.02 (43 % ) ratio 0.62  p 27 % improvement from saba p nothing prior to study with DLCO  59/63 % corrects to 77  % for alv volume  With ERV 65  - Singulair 03/14/2018 >> d/c'd 10/10/2018 s adverse effects - Allergy profile 05/24/2018 >  Eos 0.6/  IgE 25 RAST neg    - 07/03/2018  After extensive coaching inhaler device,  effectiveness =    90% finally and still flares w/in a week or two off prednisone so rec fasenra trial and continue pred with ceiling of 20 and floor of 0 as plan D -  08/28/18 started fasenra > marked clinical improvement -  07/25/2019  After extensive coaching inhaler device,  effectiveness =    75% (short Ti)  - 11/20/2019  After extensive coaching inhaler device,  effectiveness =    75% with smi > change to stiolto 2 each am / budesonide 0.5 bid per neb/ pred floor of 10 mg daily and d/c acei    Group D in terms of symptom/risk and laba/lama/ICS  therefore appropriate rx at this point >>>  Continue stiolto/ bud 0.5 bid and pred 10 mg   The goal with a chronic steroid dependent illness is always arriving at the lowest effective dose that controls the disease/symptoms and not accepting a set "formula" which is based on statistics or guidelines that don't always take into account patient  variability or the natural hx of the dz in every individual patient, which may well vary over time.  For now therefore I recommend the patient maintain  20 mg ceiling and 10 mg floor for  now  Re saba: I spent extra time with pt today reviewing appropriate use of albuterol for prn use on exertion with the following points: 1) saba is for relief of sob that does not improve by walking a slower pace or resting but rather if the pt does not improve after trying this first. 2) If the pt is convinced, as many are, that saba helps recover from activity faster then it's easy to tell if this is the case by re-challenging : ie stop, take the inhaler, then p 5 minutes try the exact same activity (intensity of workload) that just caused the symptoms and see if they are substantially diminished or not after saba 3) if there is an activity that reproducibly causes the symptoms, try the saba 15 min before the activity on alternate days   If in fact the saba really does help, then fine to continue to use it prn but advised may need to look closer at the maintenance regimen being used to achieve better control of airways disease with exertion.

## 2020-02-23 NOTE — Progress Notes (Signed)
Julia Daniels, female    DOB: [birth year]    MRN: [id number]     Brief patient profile:  [id number]  MS phenotype for alpha one who  quit smoking 08/23/17  With indolent  onset around 2014 doe gradually worse and started [facility] by [place] 2017 then [facility] late summer 2019 with dx of aecopd  And since then highly variable sob So self- referred to pulmonary clinic 10/31/2017 with GOLD III criteria for copd on initial eval but clinical course much more typical of ACOS    History of Present Illness  10/31/2017  Pulmonary/ 1st office eval/ Julia Daniels Chief Complaint  Patient presents with  . Pulmonary Consult    Self referral for COPD. Pt was dxed with COPD 2 2yrs ago. She has been seen in the past by Dr Verdie Mosher. She states she gets tired easily. She gets winded with carrying something heavy and some days with minimal walking. She uses proair once daily on average and neb with albuterol 3-4 x per wk.   Dyspnea:  Best days doe = MMRC3 = can't walk 100 yards even at a slow pace at a flat grade s stopping due to sob  / better p a few hours but highly variable s pattern  Cough: better since stopped smoking Sleep: bed is flat/ 2 pillows / no am flare  SABA use: neb helps the most  rec Plan A = Automatic = symbicort 160 Take 2 puffs first thing in am and then another 2 puffs about 12 hours later.  Work on inhaler technique:  Plan B = Backup Only use your albuterol as a rescue medication Plan C = Crisis - only use your albuterol nebulizer if you first try Plan B and it fails to help  Plan D = Deltasone  If worse despite A thru C >  Try Deltasone = Prednisone 10 mg take  4 each am x 2 days,   2 each am x 2 days,  1 each am x 2 days and stop       11/08/2017  f/u ov/Julia Daniels re:  GOLD III / no better on symb 160 2bid  Chief Complaint  Patient presents with  . Follow-up    c/o sob with exertion, using rescue inhaler daily.   Dyspnea:  Not really much better, never used prednisone as rec  Cough:  no Sleeping: bed flat 2 pillows  SABA use: about twice daily hfa and neb  02: none  rec Go ahead and take the prednisone you have on hand> a lot better and maintained on symb 160 2bid   Work on inhaler technique:  Only use your albuterol as a rescue medication        01/18/2018  f/u ov/Julia Daniels re:  Copd GOLD III  symbicort / spiriva handihaler  Still on pred/ last day Chief Complaint  Patient presents with  . Follow-up    Breathing is okay today. She has had another hospital stay since last visit for increased SOB. She has not use her rescue inhaler or neb since last hospitalization.   Dyspnea:  MMRC2 = can't walk a nl pace on a flat grade s sob but does fine slow and flat  Cough: none Sleeping:  Bed flat/ one pillow  SABA use: none now   02: none  Last flare occurred at work per H&P notes and she did not respond initially to saba  rec Plan A = Automatic = symbicort 160 Take 2 puffs and  spiriva  first thing when you wake up  and then another 2 puffs of symbicort about 12 hours later. Also add Pantoprazole (protonix) 40 mg   Take  30-60 min before first meal of the day and Pepcid (famotidine)  20 mg one @  bedtime until return to office - this is the best way to tell whether stomach acid is contributing to your problem.   Work on inhaler technique:  Plan B = Backup Only use your albuterol as a rescue medication Plan C = Crisis - only use your albuterol nebulizer if you first try Plan B and it fails to help > ok to use the nebulizer up to every 4 hours but if start needing it regularly call for immediate appointment Plan D = Deltasone  If worse despite A thru C >  Try Deltasone = Prednisone 10 mg take  4 each am x 2 days,   2 each am x 2 days,  1 each am x 2 days and stop    GERD (REFLUX)  is an extremely common cause of respiratory symptoms just like yours , many times with no obvious heartburn at all.  Please keep appt for pfts  with all medications /inhalers/ solutions in hand so we  can verify exactly what you are taking. This includes all medications from all doctors and over the counters        07/03/2018  f/u ov/Julia Daniels re:  ACOS / freq prednisone need despite sym 160/spiriva smi and singulair max rx for gerd and now 100% adherent as far as can be detected  Chief Complaint  Patient presents with  . Follow-up    Started having increased SOB approx 1 wk ago- had to use neb and rescue inhaler and so started on prednisone and currently taking 30 mg- breathing has improved some.   Dyspnea:  On prednisone MMRC2 = can't walk a nl pace on a flat grade s sob but does fine slow and flat  And off prednisone x sev weeks  > sob at rest so presently back on pred still 30 mg daily  Cough: more at work / min prod mucoid  Sleeping: ok cool room / bed blocks  SABA use: none  02: only when flaring rec We will see if we can qualify you for Fasenra injections > started 08/28/18 Work on inhaler technique:      10/03/2018  f/u ov/Julia Daniels re: ACOS  Chief Complaint  Patient presents with  . Follow-up    Breathing is much improved on Fascenra. She rarely uses her albuterol inhaler or neb.   Dyspnea:  MMRC2 = can't walk a nl pace on a flat grade s sob but does fine slow and flat  Cough: still some spells  Sleeping: fine rec Stop your singulair when you finish the bottle, ok to resume if you notice any worse respiratory symptoms   12/27/2018  f/u ov/Julia Daniels re: acos/ on fosrenra/ symb/spiriva  Chief Complaint  Patient presents with  . Follow-up    COPD GOLD ? III with increased Eos and reversibility ? ACOS ?  Dyspnea:  Walking at work / walks across parking/ no steps = MMRC1 = can walk nl pace, flat grade, can't hurry or go uphills or steps s sob   Cough: better  Sleeping: fine on bed blocks x 8 in SABA use: none 02: none  rec Try off pantoprazole and change pepcid to 20 mg after bfast and supper x one month then try off the morning  dose  Work on inhaler technique:  relax and gently  blow all the way out then take a nice smooth deep breath back in, triggering the inhaler at same time you start breathing in.   Rinse and gargle with water when done No other changes in your medications but you are a good candidate for BRESTRI   Televisit/ NP  07/15/19 for aecopd rec - Continue Symbicort 160 two puffs twice daily; Spiriva Respimat 2 puffs once daily - Continue Fasenra injections every 8 weeks as scheduled - Continue Singulair 10 mg at bedtime - Rx prednisone taper (40 mg x 3 days, 30 mg x 3 days, 20 mg x 3 days, 10 mg x 3 days, 5 mg x 3 days then stop); Epipen 0.3mg /0.52ml refilled d/t being out of date      07/25/2019  f/u ov/Julia Daniels  symptoms worse even before missed a dose of fosenra but received 06/20/19 / no longer on gerd rx and tendency to noct flares up Chief Complaint  Patient presents with  . Follow-up  Dyspnea:  Walking all over one floor at work ok but parking lot to building stops half way Cough: none Sleeping: bed blocks / still with noct flares SABA use: last 3 weeks needing neb as well  02: prn  rec Pantoprazole (protonix) 40 mg   Take  30-60 min before first meal of the day and Pepcid (famotidine)  20 mg one @  bedtime until return to office - this is the best way to tell whether stomach acid is contributing to your problem.   GERD  diet Take Prednisone  4 for three days 3 for three days 2 for three days 1 for three days and stop  Work on inhaler technique:        Admit date: 11/04/2019 Discharge date: 11/10/2019  Discharge Diagnoses:  Active Problems: Acute on chronic respiratory failure with hypoxia (HCC) COPD with acute exacerbation. Pulmonary embolism  Hospital Course:  Patient is a  24 yowf with past medical history significant for diastolic congestive heart failure, COPD on home oxygen (2-4L/min via nasal cannula), asthma and morbid obesity.  Patient presented to the emergency department from [place] with complaints  of shortness. Shortness of breath started about a day  prior to presentation, and the inhalers did not help.  Patient also reporting some lower extremity edema. Patient was admitted to rehab on 9/4 due to ambulation problem, back pain/C6 compression fracture. On presentation, patient was hypotensive, tachycardic, and hypoxic requiring high flow oxygen. CT angiogram of the chest revealed nonocclusive PE of the left upper lobe. Patient was admitted and managed for pulmonary embolism, acute on chronic hypoxic respiratory failure and COPD exacerbation.  Patient is currently on apixaban.  Patient was managed with IV Solu-Medrol and nebulizer treatment.  Patient symptoms have resolved.  Patient will be discharged back to the facility.  Acute on chronic hypoxic respiratory failure: -Required high flow oxygen on presentation. Also wheezing on presentation. CT angiogram showed PE. -Acute respiratory failure due to combination of PE and COPD exacerbation. -Patient was managed with anticoagulation, IV Solu-Medrol and nebulizer treatment. -Patient has improved significantly, and is back to baseline. -Patient be discharged back to the skilled nursing facility for rehab.   -Patient will need to follow-up with a primary care provider on discharge.    Acute PE: -CT angio showing nonocclusive pulmonary embolus within a segmental branch pulmonary artery of the left upper lobe with involvement of 2 subsegmental branches. -No central pulmonary  emboli. No evidence of right heart strain. She was started on Lovenox on admission.-Continue Eliquis. -PE seems unprovoked. -Likely lifelong anticoagulation. Aspirin is on hold.  -Echo done on this admission showed EF of 50 to 55%, no wall motion abnormality, grade 1 diastolic dysfunction.  Asthma/COPD exacerbation: -See above. -Manage with IV Solu-Medrol.  Patient was eventually transitioned to oral prednisone 60 mg p.o. once daily.  Patient be  discharged on tapering dose of prednisone.   -Continue Neb treatment -Follow up with Pulmonologist on discharge.  Diastolic congestive heart failure/bilateral lower extremity edema: -History ofhistory of diastolic congestive heart failure.  -Stable.  Hyperlipidemia:Continue Lipitor  Back pain/vertebral compression fracture: CT also showed interval progression of severe compression fracture of the T5 vertebral body. She might need to follow-up with IR for kyphoplasty after resolution of her respiratory issues. She can do that as an outpatient.. Currently she is residing at skilled nursing facility for rehabilitation. PT/OT consulted  Hypertension:  -BP stable.  -Primary care provider to continue optimizing patient's blood pressure.    GERD:Continue Protonix  Vitamin B12 deficiency:Started on supplementation.  History of anxiety/bipolar disorder: On lamotrigine, quetiapine, risperidone, Effexor  Debility/deconditioning: Admitted from [facility]. Plan is to discharge back to the same facility.    Consults: None  Significant Diagnostic Studies:  CTA of the chest revealed: 1. Nonocclusive pulmonary embolus within a segmental branch pulmonary artery of the left upper lobe with involvement of 2 subsegmental branches. No central pulmonary emboli. No evidence of right heart strain. 2. Severe compression deformity of the T4 vertebral body, new from prior. 3. Interval progression of severe compression fracture of the T5 vertebral body. There is approximately 4 mm of bony retropulsion of the T5 fracture site, progressed from prior. 4. Lungs are clear. 5. Aortic atherosclerosis   NP ov 11/11/19 recs COPD - Continue Symbicort 160 two puffs twice daily and Spiriva Respimat 2.58mcg daily  - Continue Singulair 10mg  at bedtime  - Prednisone taper as prescribed on hospital discharge  - Advised patient to speak with nursing about getting prednisone taper  and inhalers as ordered and case management about scheduling transportation for office visits - Continue to follow with Palliative care  Chronic respiratory failure - Continue home oxygen 2-4L, goal SpO2 88-90%  Pulmonary embolism: - Continue Eliquis 5mg  twice daily   Anxiety: - Continue Xanax 0.5 mg 3 times daily as needed; Risperdal 3 mg at bedtime and Seroquel 150 at bedtime     11/20/2019  f/u ov/Julia Daniels re:  Prednisone 60 mg / fasenra today / in snf for a few more weeks  Chief Complaint  Patient presents with  . Hospitalization Follow-up    does well when she is on the prednisone, but when she goes to a lower dose her breathing gets worse.   Dyspnea:  Ok at rest / using walker to get bathroom and needs neb first if near end of the every 4 hours dosing  Cough: better now Sleeping: 60 degrees SABA use: way too much  02: 24/7  At 4lpm 24/7  rec Make sure you check your oxygen saturations at highest level of activity to be sure it stays over 90% and adjust  02 flow upward to maintain this level if needed but remember to turn it back to previous settings when you stop (to conserve your supply).  Change maintenance (daily/ automatically/ timed) from symbicort and spiriva to  Stiolto 2 puffs each am   Budesonide 0.5 mg per neb  Twice daily  Only  use your albuterol nebuilizer  as a rescue medication  Try albuterol 15 min before an activity that you know would make you short of breath   Reduce prednisone to 40 mg daily for a week,  30 mg for a week, 20 mg for a week then 10 mg daily  Stop lisinopril  Start  avapro  150 mg one daily  Please schedule a follow up office visit in 2 weeks, sooner if needed  with all medications /inhalers/ solutions in hand so we can verify exactly what you are taking. This includes all medications from all doctors and over the counters      Virtual Visit via Telephone Note 02/23/2020   I connected with Julia Daniels on 02/23/20 at  29 am  by  telephone and verified that I am speaking with the correct person using two identifiers. Pt is at home and this call made from Julia office with no other participants    I discussed the limitations, risks, security and privacy concerns of performing an evaluation and management service by telephone and the availability of in person appointments. I also discussed with the patient that there may be a patient responsible charge related to this service. The patient expressed understanding and agreed to proceed.   History of Present Illness: Home from rehab x 2 weeks,  due for  fosrena 2/16  Dyspnea:  Uses walker / lives with son  Cough: none  Sleeping: 45 degrees  SABA use: neb twice daily plus prednisone 10 mg dialy  02: 2 lpm 24/7  sats run around 92% even if sob    No obvious day to day or daytime variability or assoc excess/ purulent sputum or mucus plugs or hemoptysis or cp or chest tightness, subjective wheeze or overt sinus or hb symptoms.    Also denies any obvious fluctuation of symptoms with weather or environmental changes or other aggravating or alleviating factors except as outlined above.   Meds reviewed/ med reconciliation completed     Current Meds  Medication Sig  . albuterol (PROAIR HFA) 108 (90 Base) MCG/ACT inhaler Inhale 2 puffs into the lungs every 6 (six) hours as needed for wheezing or shortness of breath.  Marland Kitchen albuterol (PROVENTIL) (2.5 MG/3ML) 0.083% nebulizer solution Take 3 mLs (2.5 mg total) by nebulization every 6 (six) hours as needed for wheezing or shortness of breath.  . ALPRAZolam (XANAX) 0.5 MG tablet Take 1 tablet (0.5 mg total) by mouth 3 (three) times daily as needed for anxiety.  Marland Kitchen apixaban (ELIQUIS) 5 MG TABS tablet Take 1 tablet (5 mg total) by mouth 2 (two) times daily.  Marland Kitchen atorvastatin (LIPITOR) 40 MG tablet Take 1 tablet (40 mg total) by mouth daily. (Patient taking differently: Take 40 mg by mouth at bedtime.)  . baclofen (LIORESAL) 10 MG tablet Take  10 mg by mouth 3 (three) times daily.   . barrier cream (NON-SPECIFIED) CREA Apply 1 application topically daily as needed (apply to rash).  . budesonide (PULMICORT) 0.5 MG/2ML nebulizer solution Take 2 mLs (0.5 mg total) by nebulization 2 (two) times daily.  Marland Kitchen FASENRA 30 MG/ML SOSY INJECT 1 SYRINGE UNDER THE SKIN EVERY 8 WEEKS.  . furosemide (LASIX) 40 MG tablet Take 40 mg by mouth daily.  Marland Kitchen guaiFENesin (MUCINEX) 600 MG 12 hr tablet Take 1 tablet (600 mg total) by mouth 2 (two) times daily. (Patient taking differently: Take 1,200 mg by mouth 2 (two) times daily.)  . HYDROcodone-acetaminophen (NORCO/VICODIN) 5-325 MG tablet Take 1  tablet by mouth every 6 (six) hours as needed for up to 4 doses for severe pain.  . Infant Care Products (DERMACLOUD) OINT Apply 1 application topically daily as needed (rash).  Marland Kitchen lamoTRIgine (LAMICTAL) 200 MG tablet Take 200 mg by mouth at bedtime.   . lidocaine (LIDODERM) 5 % Place 1 patch onto the skin daily. Remove & Discard patch within 12 hours or as directed by MD  . loratadine (CLARITIN) 10 MG tablet Take 10 mg by mouth every evening.   . Methylphenidate HCl ER 72 MG TBCR Take 72 mg by mouth daily.  . montelukast (SINGULAIR) 10 MG tablet Take 1 tablet (10 mg total) by mouth at bedtime.  . pantoprazole (PROTONIX) 40 MG tablet Take 1 tablet (40 mg total) by mouth daily. Take 30-60 min before first meal of the day  . potassium chloride (KLOR-CON) 10 MEQ tablet Take 20 mEq by mouth daily.  . predniSONE (DELTASONE) 10 MG tablet Take 1 tablet (10 mg total) by mouth daily with breakfast.  . QUEtiapine (SEROQUEL) 100 MG tablet Take 1.5 tablets (150 mg total) by mouth at bedtime.  . risperiDONE (RISPERDAL) 3 MG tablet Take 1 tablet (3 mg total) by mouth at bedtime.  . senna-docusate (SENOKOT-S) 8.6-50 MG tablet Take 1 tablet by mouth 2 (two) times daily.  . Tiotropium Bromide-Olodaterol (STIOLTO RESPIMAT) 2.5-2.5 MCG/ACT AERS Inhale 2 puffs into the lungs daily.  Marland Kitchen  venlafaxine XR (EFFEXOR-XR) 150 MG 24 hr capsule Take 150 mg by mouth at bedtime.   . vitamin B-12 (CYANOCOBALAMIN) 500 MCG tablet Take 1 tablet (500 mcg total) by mouth daily.  . Vitamin D, Ergocalciferol, (DRISDOL) 50000 units CAPS capsule Take 50,000 Units by mouth every Saturday.   .     .    .           Observations/Objective: Seems a bit anxious but good voice texture s sob speaking and no spont cough   Assessment and Plan: See problem list for active a/p's   Follow Up Instructions: See avs for instructions unique to this ov which includes revised/ updated med list     I discussed the assessment and treatment plan with the patient. The patient was provided an opportunity to ask questions and all were answered. The patient agreed with the plan and demonstrated an understanding of the instructions.   The patient was advised to call back or seek an in-person evaluation if the symptoms worsen or if the condition fails to improve as anticipated.  I provided  22  minutes of non-face-to-face time during this encounter.   Christinia Gully, MD

## 2020-02-24 NOTE — Telephone Encounter (Signed)
Spoke with the pt and scheduled her 4 wk televisit.

## 2020-02-25 DIAGNOSIS — J4550 Severe persistent asthma, uncomplicated: Secondary | ICD-10-CM | POA: Diagnosis not present

## 2020-02-28 DIAGNOSIS — S22000D Wedge compression fracture of unspecified thoracic vertebra, subsequent encounter for fracture with routine healing: Secondary | ICD-10-CM | POA: Diagnosis not present

## 2020-02-28 DIAGNOSIS — J449 Chronic obstructive pulmonary disease, unspecified: Secondary | ICD-10-CM | POA: Diagnosis not present

## 2020-02-28 DIAGNOSIS — M6281 Muscle weakness (generalized): Secondary | ICD-10-CM | POA: Diagnosis not present

## 2020-02-28 DIAGNOSIS — J9621 Acute and chronic respiratory failure with hypoxia: Secondary | ICD-10-CM | POA: Diagnosis not present

## 2020-03-01 DIAGNOSIS — G8929 Other chronic pain: Secondary | ICD-10-CM | POA: Diagnosis not present

## 2020-03-01 DIAGNOSIS — E559 Vitamin D deficiency, unspecified: Secondary | ICD-10-CM | POA: Diagnosis not present

## 2020-03-01 DIAGNOSIS — M549 Dorsalgia, unspecified: Secondary | ICD-10-CM | POA: Diagnosis not present

## 2020-03-01 DIAGNOSIS — R5383 Other fatigue: Secondary | ICD-10-CM | POA: Diagnosis not present

## 2020-03-01 DIAGNOSIS — Z8679 Personal history of other diseases of the circulatory system: Secondary | ICD-10-CM | POA: Diagnosis not present

## 2020-03-04 ENCOUNTER — Telehealth: Payer: Self-pay | Admitting: Internal Medicine

## 2020-03-04 NOTE — Telephone Encounter (Signed)
Patient is returning phone call. Patient phone number is [phone].

## 2020-03-04 NOTE — Telephone Encounter (Signed)
I called and spoke with pt and she stated that since her last OV with MW on 02/14 that she has increased her prednisone how he told her to and she has stayed on this as she has not gotten any better.   She has followed the ABC plan from her last OV with MW>    She stated that now she is having a hard time talking for longer periods of time as this causes her to be [facility] as well.  She wanted to follow up with MW to find out what else she needs to do?  Another OV?  Change of meds?  Thanks  Allergies  Allergen Reactions  . Sulfa Antibiotics Other (See Comments)    Mouth gets raw

## 2020-03-04 NOTE — Telephone Encounter (Signed)
Ov with all meds in hand either [place] or gso - to er in meantime if can' get comfortable at rest after albuterol treatment

## 2020-03-04 NOTE — Telephone Encounter (Signed)
Called and spoke with Patient to follow up on Fasenra injections. Patient stated she had Fasenra delivered to her home address for home health nurse to administer. Patient stated son was available and administered Fasenra injections with no problems.

## 2020-03-04 NOTE — Telephone Encounter (Signed)
lmtcb for pt to see if she has used Dr. Gustavus Bryant ABC plan outlined in his last OV note.

## 2020-03-04 NOTE — Telephone Encounter (Signed)
Called and spoke with pt letting her know that MW does want [place] to get her scheduled for an appt for further evaluation. Pt verbalized understanding. Pt has been scheduled for an appt tomorrow 2/25 with [facility]. Pt stated she needs to see if she is able to get a ride and said if she was not able to get a ride that she would call us to reschedule the appt. Stated to pt if she was not able to get comfortable at rest after doing an albuterol treatment that she needed to go to ED. Pt verbalized understanding. Nothing further needed.

## 2020-03-05 ENCOUNTER — Other Ambulatory Visit: Payer: Self-pay

## 2020-03-05 ENCOUNTER — Ambulatory Visit (INDEPENDENT_AMBULATORY_CARE_PROVIDER_SITE_OTHER): Payer: BC Managed Care – PPO

## 2020-03-05 ENCOUNTER — Encounter: Payer: Self-pay | Admitting: Primary Care

## 2020-03-05 ENCOUNTER — Ambulatory Visit: Payer: BC Managed Care – PPO | Admitting: Primary Care

## 2020-03-05 VITALS — BP 140/90 | HR 105 | Temp 98.1°F | Ht 63.0 in | Wt 236.8 lb

## 2020-03-05 DIAGNOSIS — R0602 Shortness of breath: Secondary | ICD-10-CM

## 2020-03-05 DIAGNOSIS — J449 Chronic obstructive pulmonary disease, unspecified: Secondary | ICD-10-CM

## 2020-03-05 DIAGNOSIS — I5033 Acute on chronic diastolic (congestive) heart failure: Secondary | ICD-10-CM | POA: Diagnosis not present

## 2020-03-05 DIAGNOSIS — J90 Pleural effusion, not elsewhere classified: Secondary | ICD-10-CM | POA: Diagnosis not present

## 2020-03-05 DIAGNOSIS — I517 Cardiomegaly: Secondary | ICD-10-CM | POA: Diagnosis not present

## 2020-03-05 MED ORDER — PREDNISONE 10 MG PO TABS: ORAL_TABLET | ORAL | 0 refills | Status: DC

## 2020-03-05 MED ORDER — POTASSIUM CHLORIDE ER 20 MEQ PO TBCR
EXTENDED_RELEASE_TABLET | ORAL | 0 refills | Status: DC
Start: 2020-03-05 — End: 2020-03-10

## 2020-03-05 MED ORDER — FUROSEMIDE 40 MG PO TABS
40.0000 mg | ORAL_TABLET | Freq: Two times a day (BID) | ORAL | 0 refills | Status: DC
Start: 2020-03-05 — End: 2020-03-15

## 2020-03-05 NOTE — Assessment & Plan Note (Signed)
-   Patient reports increased shortness of breath, weight gain and leg swelling - Checking CXR and labs (BNP/BMET) - Take lasix 80mg  in morning and 40mg  in afternoon over the weekend  - Take 45meq potassium daily while on higher dose lasix  - FU after testing and in 1 week. Advised ED if sob symptoms worsen.

## 2020-03-05 NOTE — Patient Instructions (Addendum)
Recommendations: - Take lasix 80mg  in morning and 40mg  in afternoon over the weekend  - Take 19meq potassium daily while on higher dose lasix  - Prednisone taper as directed (40mg  x 3 days; 30mg x 3 days; 20mg  x 3 days; then resume regular dose) - Continue Budesonide nebulizer twice a day and Stiolto   Orders: - CXR today - Labs (BNP, BMET)  Follow-up: - 1 week with Beth NP or Dr. Melvyn Novas

## 2020-03-05 NOTE — Assessment & Plan Note (Addendum)
-   Patient had wheezing on exam. No significant cough. Shortness of breath felt to be more related to heart failure but can not differentiate without imaging/labs  - Continue Stiolto and Budesonide nebulizer twice a day - Prednisone taper 40mg  x 3 days; 30mg x 3 days; 20mg  x 3 days; then resume regular dose

## 2020-03-05 NOTE — Progress Notes (Signed)
@Patient  ID: Julia Daniels, female    DOB: October 31, 1950, 70 y.o.   MRN: [id number]  Chief Complaint  Patient presents with  . Acute Visit    Pt is still having increased SOB even after doing plan stated by Dr. Melvyn Novas at last [place]. Pt said after she sits for awhile, her breathing will get some better but when talking she becomes SOB again. Denies any complaints of cough or wheezing.    Referring provider: Simona Huh, NP  HPI: 70 year old female, former smoker. PMH significant for COPD Gold 3 with reversibility, eosinophilia, chronic respiratory failure, chronic diastolic heart failure, hypertension. Patient of Dr. Melvyn Novas, last seen in office on 02/23/2020. Maintained on Stiolto Respimat, budesonide nebulizers twice daily and Fasenra. Prednisone ceiling 20mg /floor 10mg  daily.   03/05/2020 Patient presents today for acute visit with reports on ongoing shortness of breath, no better since last visit. She received fasenra injection on 03/01/20 from home health nurse. Breathing has been declining. She gets winded with exertion and talking. Shortness of breath does recover with rest. She has been taking 20mg  prednisone without any noticeable improvement. She is mostly compliant with budesonide nebulizer twice a day and Stiolto. She has to sleep at 45 degree angle. Her weight is up significantly, approx 20 lbs in the last 6 weeks and she has increased leg swelling. [place] increased her lasix to 80mg  daily since Monday 03/01/20. She is looking for new PCP. Denies cough, chest tightness, wheezing.   Allergies  Allergen Reactions  . Sulfa Antibiotics Other (See Comments)    Mouth gets raw    Immunization History  Administered Date(s) Administered  . Fluad Quad(high Dose 65+) 10/03/2018, 10/23/2019  . Influenza Whole 09/09/2017  . Influenza,inj,Quad PF,6+ Mos 09/23/2012  . Pneumococcal Polysaccharide-23 02/02/2018, 12/14/2018  . Tdap 02/07/2013    Past Medical History:  Diagnosis Date   . Anxiety   . Asthma   . COPD (chronic obstructive pulmonary disease) ([place])   . Depression     Tobacco History: Social History   Tobacco Use  Smoking Status Former Smoker  . Packs/day: 1.00  . Years: 46.00  . Pack years: 46.00  . Types: Cigarettes  . Start date: 66  . Quit date: 08/23/2017  . Years since quitting: 2.5  Smokeless Tobacco Never Used   Counseling given: Not Answered   Outpatient Medications Prior to Visit  Medication Sig Dispense Refill  . albuterol (PROAIR HFA) 108 (90 Base) MCG/ACT inhaler Inhale 2 puffs into the lungs every 6 (six) hours as needed for wheezing or shortness of breath. 18 g 5  . albuterol (PROVENTIL) (2.5 MG/3ML) 0.083% nebulizer solution Take 3 mLs (2.5 mg total) by nebulization every 6 (six) hours as needed for wheezing or shortness of breath. 300 mL 12  . ALPRAZolam (XANAX) 0.25 MG tablet Take 0.25 mg by mouth 2 (two) times daily as needed.    Marland Kitchen apixaban (ELIQUIS) 5 MG TABS tablet Take 1 tablet (5 mg total) by mouth 2 (two) times daily.    Marland Kitchen atorvastatin (LIPITOR) 40 MG tablet Take 40 mg by mouth daily.    . baclofen (LIORESAL) 10 MG tablet Take 10 mg by mouth 3 (three) times daily.     . barrier cream (NON-SPECIFIED) CREA Apply 1 application topically daily as needed (apply to rash). 1 each 1  . budesonide (PULMICORT) 0.5 MG/2ML nebulizer solution Take 2 mLs (0.5 mg total) by nebulization 2 (two) times daily. 120 mL 12  . famotidine (PEPCID) 20  MG tablet One at bedtime 90 tablet 3  . FASENRA 30 MG/ML SOSY INJECT 1 SYRINGE UNDER THE SKIN EVERY 8 WEEKS. 1 mL 6  . guaiFENesin (MUCINEX) 600 MG 12 hr tablet Take 1 tablet (600 mg total) by mouth 2 (two) times daily. (Patient taking differently: Take 1,200 mg by mouth 2 (two) times daily.)    . HYDROcodone-acetaminophen (NORCO/VICODIN) 5-325 MG tablet Take 1 tablet by mouth every 6 (six) hours as needed for up to 4 doses for severe pain. 4 tablet 0  . Infant [employer] [facility]) OINT Apply 1  application topically daily as needed (rash).    Marland Kitchen lamoTRIgine (LAMICTAL) 200 MG tablet Take 200 mg by mouth at bedtime.     . lidocaine (LIDODERM) 5 % Place 1 patch onto the skin daily. Remove & Discard patch within 12 hours or as directed by MD 30 patch 0  . loratadine (CLARITIN) 10 MG tablet Take 10 mg by mouth every evening.     . montelukast (SINGULAIR) 10 MG tablet Take 1 tablet (10 mg total) by mouth at bedtime. 30 tablet 11  . pantoprazole (PROTONIX) 40 MG tablet Take 1 tablet (40 mg total) by mouth daily. Take 30-60 min before first meal of the day 30 tablet 11  . predniSONE (DELTASONE) 10 MG tablet Take 1 tablet (10 mg total) by mouth daily with breakfast.    . QUEtiapine (SEROQUEL) 100 MG tablet Take 1.5 tablets (150 mg total) by mouth at bedtime. 30 tablet 0  . risperiDONE (RISPERDAL) 3 MG tablet Take 1 tablet (3 mg total) by mouth at bedtime. 30 tablet 0  . senna-docusate (SENOKOT-S) 8.6-50 MG tablet Take 1 tablet by mouth 2 (two) times daily.    . Tiotropium Bromide-Olodaterol (STIOLTO RESPIMAT) 2.5-2.5 MCG/ACT AERS Inhale 2 puffs into the lungs daily. 1 each 11  . venlafaxine XR (EFFEXOR-XR) 150 MG 24 hr capsule Take 150 mg by mouth at bedtime.     . vitamin B-12 (CYANOCOBALAMIN) 500 MCG tablet Take 1 tablet (500 mcg total) by mouth daily. 30 tablet 0  . Vitamin D, Ergocalciferol, (DRISDOL) 50000 units CAPS capsule Take 50,000 Units by mouth every Saturday.   0  . furosemide (LASIX) 40 MG tablet Take 40 mg by mouth daily.    . potassium chloride (KLOR-CON) 10 MEQ tablet Take 20 mEq by mouth daily.    Marland Kitchen atorvastatin (LIPITOR) 40 MG tablet Take 1 tablet (40 mg total) by mouth daily. (Patient taking differently: Take 40 mg by mouth at bedtime.) 30 tablet 0  . ALPRAZolam (XANAX) 0.5 MG tablet Take 1 tablet (0.5 mg total) by mouth 3 (three) times daily as needed for anxiety. 15 tablet 0  . Methylphenidate HCl ER 72 MG TBCR Take 72 mg by mouth daily. 30 tablet 0   No  facility-administered medications prior to visit.      Review of Systems  Review of Systems   Physical Exam  BP 140/90 (BP Location: Right Wrist, Cuff Size: Normal)   Pulse (!) 105   Temp 98.1 F (36.7 C) (Temporal)   Ht 5\' 3"  (1.6 m)   Wt 236 lb 12.8 oz (107.4 kg)   SpO2 97% Comment: 4L cont  BMI 41.95 kg/m  Physical Exam   Lab Results:  CBC    Component Value Date/Time   WBC 9.9 12/10/2019 0527   RBC 3.82 (L) 12/10/2019 0527   HGB 11.9 (L) 12/10/2019 0527   HCT 39.8 12/10/2019 0527   PLT 334 12/10/2019 0527  MCV 104.2 (H) 12/10/2019 0527   MCH 31.2 12/10/2019 0527   MCHC 29.9 (L) 12/10/2019 0527   RDW 13.3 12/10/2019 0527   LYMPHSABS 2.0 12/08/2019 0523   MONOABS 1.0 12/08/2019 0523   EOSABS 0.0 12/08/2019 0523   BASOSABS 0.1 12/08/2019 0523    BMET    Component Value Date/Time   NA 137 12/10/2019 0527   K 4.3 12/10/2019 0527   CL 96 (L) 12/10/2019 0527   CO2 32 12/10/2019 0527   GLUCOSE 94 12/10/2019 0527   BUN 15 12/10/2019 0527   CREATININE 0.63 12/10/2019 0527   CALCIUM 9.6 12/10/2019 0527   GFRNONAA >60 12/10/2019 0527   GFRAA >60 10/01/2019 0939    BNP    Component Value Date/Time   BNP 77.3 12/04/2019 2027    ProBNP No results found for: PROBNP  Imaging: No results found.   Assessment & Plan:   Acute on chronic diastolic CHF (congestive heart failure) ([place]) - Patient reports increased shortness of breath, weight gain and leg swelling - Checking CXR and labs (BNP/BMET) - Take lasix 80mg  in morning and 40mg  in afternoon over the weekend  - Take 44meq potassium daily while on higher dose lasix  - FU after testing and in 1 week. Advised ED if sob symptoms worsen.   COPD  GOLD ? III with increased Eos and reversibility ? ACOS ?  - Patient had wheezing on exam. No significant cough. Shortness of breath felt to be more related to heart failure but can not differentiate without imaging/labs  - Continue Stiolto and Budesonide  nebulizer twice a day - Prednisone taper 40mg  x 3 days; 30mg x 3 days; 20mg  x 3 days; then resume regular dose       Martyn Ehrich, NP 03/05/2020

## 2020-03-06 LAB — BASIC METABOLIC PANEL
BUN: 14 mg/dL (ref 7–25)
CO2: 34 mmol/L — ABNORMAL HIGH (ref 20–32)
Calcium: 9.3 mg/dL (ref 8.6–10.4)
Chloride: 102 mmol/L (ref 98–110)
Creat: 0.7 mg/dL (ref 0.50–0.99)
Glucose, Bld: 119 mg/dL — ABNORMAL HIGH (ref 65–99)
Potassium: 4.3 mmol/L (ref 3.5–5.3)
Sodium: 143 mmol/L (ref 135–146)

## 2020-03-06 LAB — BRAIN NATRIURETIC PEPTIDE: Brain Natriuretic Peptide: 90 pg/mL (ref <100)

## 2020-03-08 ENCOUNTER — Telehealth: Payer: Self-pay | Admitting: Primary Care

## 2020-03-08 DIAGNOSIS — I5033 Acute on chronic diastolic (congestive) heart failure: Secondary | ICD-10-CM

## 2020-03-08 DIAGNOSIS — J449 Chronic obstructive pulmonary disease, unspecified: Secondary | ICD-10-CM

## 2020-03-08 DIAGNOSIS — J9612 Chronic respiratory failure with hypercapnia: Secondary | ICD-10-CM

## 2020-03-08 DIAGNOSIS — R0602 Shortness of breath: Secondary | ICD-10-CM

## 2020-03-08 DIAGNOSIS — J9611 Chronic respiratory failure with hypoxia: Secondary | ICD-10-CM

## 2020-03-08 DIAGNOSIS — J9621 Acute and chronic respiratory failure with hypoxia: Secondary | ICD-10-CM

## 2020-03-08 NOTE — Telephone Encounter (Signed)
Called and spoke with pt to see who had called her and she said Beth had previously called her about recent test results. Pt stated that she has been able to speak with [facility] about the results.  Pt states she is needing a referral sent to [facility]. States that she has already called and spoken to them and they should be calling our office tomorrow 3/1.  Will leave encounter open since pt states [facility] should be contacting office tomorrow. Once we have received phone call, we will send to Dr. Melvyn Novas.

## 2020-03-08 NOTE — Telephone Encounter (Signed)
Patient needs referral sent to [facility]. Patient phone number is [phone].

## 2020-03-09 ENCOUNTER — Other Ambulatory Visit: Payer: Self-pay | Admitting: Primary Care

## 2020-03-09 NOTE — Telephone Encounter (Signed)
C[facility] needs order for PT and OT. Also needs for skilled nursing. [place] phone number is [phone].

## 2020-03-09 NOTE — Telephone Encounter (Signed)
Fine with me

## 2020-03-09 NOTE — Telephone Encounter (Signed)
Called and spoke to nurse with [facility] and was advised pt began PT, OT, and nursing skill. [facility] reached out to pt's PCP but they never signed off on the order. Pt is wanting to continue this care but is requesting pulmonary write the new order for PT OT and nursing skill, stating the pt would work on education for cardiovascular and o2, monior o2 sats, monitoring weight gain, infection control, copd management, safe transfers, safe ambulation. Nursing would visit once weekly for 5 weeks then every other week for 2 weeks, PT would visit twice weekly for 2 weeks and she states OT just did an eval and is unsure how frequent their visits would be.    Dr. Melvyn Novas, please advise if ok writing new order for [facility]: PT, OT, and skilled nursing. Thanks.

## 2020-03-09 NOTE — Telephone Encounter (Signed)
Called and spoke with Crystal and she told me we needed a new intake order.  So amb referral to home health placed with information provided.  Needs to be faxed to [phone] with insurance , they will then see if they   Order placed.

## 2020-03-09 NOTE — Telephone Encounter (Signed)
Beth, please advise if you are okay refilling med. 

## 2020-03-10 NOTE — Telephone Encounter (Signed)
Yes

## 2020-03-11 ENCOUNTER — Ambulatory Visit: Payer: BC Managed Care – PPO

## 2020-03-15 ENCOUNTER — Encounter: Payer: Self-pay | Admitting: Primary Care

## 2020-03-15 ENCOUNTER — Other Ambulatory Visit: Payer: Self-pay | Admitting: Primary Care

## 2020-03-15 ENCOUNTER — Other Ambulatory Visit: Payer: Self-pay

## 2020-03-15 ENCOUNTER — Ambulatory Visit (INDEPENDENT_AMBULATORY_CARE_PROVIDER_SITE_OTHER): Payer: BC Managed Care – PPO | Admitting: Primary Care

## 2020-03-15 DIAGNOSIS — J449 Chronic obstructive pulmonary disease, unspecified: Secondary | ICD-10-CM | POA: Diagnosis not present

## 2020-03-15 DIAGNOSIS — I5033 Acute on chronic diastolic (congestive) heart failure: Secondary | ICD-10-CM | POA: Diagnosis not present

## 2020-03-15 MED ORDER — FUROSEMIDE 40 MG PO TABS
ORAL_TABLET | ORAL | 0 refills | Status: DC
Start: 2020-03-15 — End: 2020-04-01

## 2020-03-15 NOTE — Progress Notes (Signed)
Virtual Visit via Telephone Note  I connected with Julia Daniels on 03/15/20 at 11:30 AM EST by telephone and verified that I am speaking with the correct person using two identifiers.  Location: Patient: Home Provider: Office   I discussed the limitations, risks, security and privacy concerns of performing an evaluation and management service by telephone and the availability of in person appointments. I also discussed with the patient that there may be a patient responsible charge related to this service. The patient expressed understanding and agreed to proceed.   History of Present Illness: 70 year old female, former smoker. PMH significant for COPD Gold 3 with reversibility, eosinophilia, chronic respiratory failure, chronic diastolic heart failure, hypertension. Patient of Dr. Melvyn Novas, last seen in office on 02/23/2020. Maintained on Stiolto Respimat, budesonide nebulizers twice daily and Fasenra. Prednisone ceiling 20mg /floor 10mg  daily.   Previous [facility] pulmonary encounter: 03/05/2020 Patient presents today for acute visit with reports on ongoing shortness of breath, no better since last visit. She received fasenra injection on 03/01/20 from home health nurse. Breathing has been declining. She gets winded with exertion and talking. Shortness of breath does recover with rest. She has been taking 20mg  prednisone without any noticeable improvement. She is mostly compliant with budesonide nebulizer twice a day and Stiolto. She has to sleep at 45 degree angle. Her weight is up significantly, approx 20 lbs in the last 6 weeks and she has increased leg swelling. [place] increased her lasix to 80mg  daily since Monday 03/01/20. She is looking for new PCP. Denies cough, chest tightness, wheezing.  03/15/2020 Patient contacted today for 10 day follow-up. During last visit we increased her lasix to 120mg  total per day x 5 days for exacerbation HF d/t  weight gain, leg swelling and shortness of  breath. [place] had previously increased her lasix to 80mg  with little improvement. She was also given prednisone taper for COPD exacerbation. BNP was 90, kidney function wnl . Today she reports that she is doing well. States that her feet swelling and abdominal bloating are way down. She has been strict about elevating her legs. Finsihed prednisone taper. Lasix is back to 40mg  daily. She can tell a difference in her breathing. She feels she is back to her baseline.     Observations/Objective:  - Able to speak in full sentences; no overt shortness of breath/wheezing  Assessment and Plan:   COPD/asthma overlap: - Continue Stiolto respimat 2 puff daily, Pulmicort nebulizer BID, Singulair 10mg  qhs and Fasenra q 8 week  Acute on chronic diastolic heart failure: - Treated for acute exacerbation last week with additional lasix. Swelling and dyspnea significantly improved.  - Continue lasix 40mg  daily; additional tablet in the afternoon prn leg swell/weight gain (refill provided today, further RX needs to come from PCP)   Follow Up Instructions:   - 3 months with Dr. Melvyn Novas  I discussed the assessment and treatment plan with the patient. The patient was provided an opportunity to ask questions and all were answered. The patient agreed with the plan and demonstrated an understanding of the instructions.   The patient was advised to call back or seek an in-person evaluation if the symptoms worsen or if the condition fails to improve as anticipated.  I provided 18 minutes of non-face-to-face time during this encounter.   Martyn Ehrich, NP

## 2020-03-15 NOTE — Patient Instructions (Signed)
Resume lasix 40mg  daily, may take additional tablet in afternoon if needed for leg swelling/weight gain > 5 lbs   Follow up 3 months with Dr. Melvyn Novas

## 2020-03-20 ENCOUNTER — Other Ambulatory Visit: Payer: Self-pay

## 2020-03-20 ENCOUNTER — Emergency Department (HOSPITAL_COMMUNITY): Payer: Self-pay

## 2020-03-20 ENCOUNTER — Inpatient Hospital Stay (HOSPITAL_COMMUNITY)
Admission: EM | Admit: 2020-03-20 | Discharge: 2020-04-01 | DRG: 286 | Disposition: A | Discharge status: Home Health | Payer: Self-pay | Attending: Internal Medicine | Admitting: Internal Medicine

## 2020-03-20 DIAGNOSIS — R7303 Prediabetes: Secondary | ICD-10-CM | POA: Diagnosis present

## 2020-03-20 DIAGNOSIS — J309 Allergic rhinitis, unspecified: Secondary | ICD-10-CM | POA: Diagnosis present

## 2020-03-20 DIAGNOSIS — Z66 Do not resuscitate: Secondary | ICD-10-CM | POA: Diagnosis present

## 2020-03-20 DIAGNOSIS — Z79899 Other long term (current) drug therapy: Secondary | ICD-10-CM

## 2020-03-20 DIAGNOSIS — I5043 Acute on chronic combined systolic (congestive) and diastolic (congestive) heart failure: Secondary | ICD-10-CM | POA: Diagnosis present

## 2020-03-20 DIAGNOSIS — Z7952 Long term (current) use of systemic steroids: Secondary | ICD-10-CM

## 2020-03-20 DIAGNOSIS — Z90710 Acquired absence of both cervix and uterus: Secondary | ICD-10-CM

## 2020-03-20 DIAGNOSIS — J441 Chronic obstructive pulmonary disease with (acute) exacerbation: Secondary | ICD-10-CM | POA: Diagnosis present

## 2020-03-20 DIAGNOSIS — Z7189 Other specified counseling: Secondary | ICD-10-CM

## 2020-03-20 DIAGNOSIS — D539 Nutritional anemia, unspecified: Secondary | ICD-10-CM | POA: Diagnosis present

## 2020-03-20 DIAGNOSIS — J9620 Acute and chronic respiratory failure, unspecified whether with hypoxia or hypercapnia: Secondary | ICD-10-CM | POA: Diagnosis present

## 2020-03-20 DIAGNOSIS — R5381 Other malaise: Secondary | ICD-10-CM | POA: Diagnosis present

## 2020-03-20 DIAGNOSIS — F319 Bipolar disorder, unspecified: Secondary | ICD-10-CM | POA: Diagnosis present

## 2020-03-20 DIAGNOSIS — J810 Acute pulmonary edema: Secondary | ICD-10-CM | POA: Diagnosis not present

## 2020-03-20 DIAGNOSIS — J449 Chronic obstructive pulmonary disease, unspecified: Secondary | ICD-10-CM

## 2020-03-20 DIAGNOSIS — I2699 Other pulmonary embolism without acute cor pulmonale: Secondary | ICD-10-CM

## 2020-03-20 DIAGNOSIS — Z20822 Contact with and (suspected) exposure to covid-19: Secondary | ICD-10-CM | POA: Diagnosis present

## 2020-03-20 DIAGNOSIS — R0689 Other abnormalities of breathing: Secondary | ICD-10-CM | POA: Diagnosis not present

## 2020-03-20 DIAGNOSIS — F32A Depression, unspecified: Secondary | ICD-10-CM | POA: Diagnosis present

## 2020-03-20 DIAGNOSIS — E785 Hyperlipidemia, unspecified: Secondary | ICD-10-CM | POA: Diagnosis present

## 2020-03-20 DIAGNOSIS — J9621 Acute and chronic respiratory failure with hypoxia: Secondary | ICD-10-CM | POA: Diagnosis present

## 2020-03-20 DIAGNOSIS — I5033 Acute on chronic diastolic (congestive) heart failure: Secondary | ICD-10-CM | POA: Diagnosis present

## 2020-03-20 DIAGNOSIS — Z87891 Personal history of nicotine dependence: Secondary | ICD-10-CM

## 2020-03-20 DIAGNOSIS — Z86711 Personal history of pulmonary embolism: Secondary | ICD-10-CM

## 2020-03-20 DIAGNOSIS — F419 Anxiety disorder, unspecified: Secondary | ICD-10-CM | POA: Diagnosis present

## 2020-03-20 DIAGNOSIS — Z7901 Long term (current) use of anticoagulants: Secondary | ICD-10-CM

## 2020-03-20 DIAGNOSIS — I2510 Atherosclerotic heart disease of native coronary artery without angina pectoris: Secondary | ICD-10-CM | POA: Diagnosis present

## 2020-03-20 DIAGNOSIS — R230 Cyanosis: Secondary | ICD-10-CM | POA: Diagnosis not present

## 2020-03-20 DIAGNOSIS — D72828 Other elevated white blood cell count: Secondary | ICD-10-CM | POA: Diagnosis not present

## 2020-03-20 DIAGNOSIS — Z6841 Body Mass Index (BMI) 40.0 and over, adult: Secondary | ICD-10-CM

## 2020-03-20 DIAGNOSIS — E872 Acidosis: Secondary | ICD-10-CM | POA: Diagnosis present

## 2020-03-20 DIAGNOSIS — K219 Gastro-esophageal reflux disease without esophagitis: Secondary | ICD-10-CM | POA: Diagnosis present

## 2020-03-20 DIAGNOSIS — I959 Hypotension, unspecified: Secondary | ICD-10-CM | POA: Diagnosis present

## 2020-03-20 DIAGNOSIS — R0902 Hypoxemia: Secondary | ICD-10-CM | POA: Diagnosis not present

## 2020-03-20 DIAGNOSIS — J9622 Acute and chronic respiratory failure with hypercapnia: Secondary | ICD-10-CM | POA: Diagnosis present

## 2020-03-20 DIAGNOSIS — I110 Hypertensive heart disease with heart failure: Principal | ICD-10-CM | POA: Diagnosis present

## 2020-03-20 DIAGNOSIS — R0602 Shortness of breath: Secondary | ICD-10-CM | POA: Diagnosis not present

## 2020-03-20 DIAGNOSIS — E662 Morbid (severe) obesity with alveolar hypoventilation: Secondary | ICD-10-CM | POA: Diagnosis present

## 2020-03-20 DIAGNOSIS — J45909 Unspecified asthma, uncomplicated: Secondary | ICD-10-CM | POA: Diagnosis not present

## 2020-03-20 DIAGNOSIS — I272 Pulmonary hypertension, unspecified: Secondary | ICD-10-CM | POA: Diagnosis present

## 2020-03-20 DIAGNOSIS — I714 Abdominal aortic aneurysm, without rupture: Secondary | ICD-10-CM | POA: Diagnosis present

## 2020-03-20 DIAGNOSIS — Z7951 Long term (current) use of inhaled steroids: Secondary | ICD-10-CM

## 2020-03-20 DIAGNOSIS — I429 Cardiomyopathy, unspecified: Secondary | ICD-10-CM | POA: Diagnosis present

## 2020-03-20 DIAGNOSIS — Z9981 Dependence on supplemental oxygen: Secondary | ICD-10-CM

## 2020-03-20 LAB — CBC WITH DIFFERENTIAL/PLATELET
Abs Immature Granulocytes: 0.06 10*3/uL (ref 0.00–0.07)
Basophils Absolute: 0 10*3/uL (ref 0.0–0.1)
Basophils Relative: 0 %
Eosinophils Absolute: 0 10*3/uL (ref 0.0–0.5)
Eosinophils Relative: 0 %
HCT: 36.7 % (ref 36.0–46.0)
Hemoglobin: 10.8 g/dL — ABNORMAL LOW (ref 12.0–15.0)
Immature Granulocytes: 1 %
Lymphocytes Relative: 9 %
Lymphs Abs: 1.1 10*3/uL (ref 0.7–4.0)
MCH: 31.8 pg (ref 26.0–34.0)
MCHC: 29.4 g/dL — ABNORMAL LOW (ref 30.0–36.0)
MCV: 107.9 fL — ABNORMAL HIGH (ref 80.0–100.0)
Monocytes Absolute: 0.8 10*3/uL (ref 0.1–1.0)
Monocytes Relative: 7 %
Neutro Abs: 10.7 10*3/uL — ABNORMAL HIGH (ref 1.7–7.7)
Neutrophils Relative %: 83 %
Platelets: 266 10*3/uL (ref 150–400)
RBC: 3.4 MIL/uL — ABNORMAL LOW (ref 3.87–5.11)
RDW: 12.7 % (ref 11.5–15.5)
WBC: 12.7 10*3/uL — ABNORMAL HIGH (ref 4.0–10.5)
nRBC: 0 % (ref 0.0–0.2)

## 2020-03-20 LAB — BASIC METABOLIC PANEL
Anion gap: 10 (ref 5–15)
BUN: 12 mg/dL (ref 8–23)
CO2: 32 mmol/L (ref 22–32)
Calcium: 9.9 mg/dL (ref 8.9–10.3)
Chloride: 99 mmol/L (ref 98–111)
Creatinine, Ser: 0.7 mg/dL (ref 0.44–1.00)
GFR, Estimated: >60 mL/min (ref >60)
Glucose, Bld: 130 mg/dL — ABNORMAL HIGH (ref 70–99)
Potassium: 4.9 mmol/L (ref 3.5–5.1)
Sodium: 141 mmol/L (ref 135–145)

## 2020-03-20 LAB — POC SARS CORONAVIRUS 2 AG -  ED: SARS Coronavirus 2 Ag: NEGATIVE

## 2020-03-20 MED ORDER — LORAZEPAM 2 MG/ML IJ SOLN
1.0000 mg | Freq: Once | INTRAMUSCULAR | Status: AC
  Administered 2020-03-21: 0.5 mg via INTRAVENOUS
  Filled 2020-03-20: qty 1

## 2020-03-20 MED ORDER — ALBUTEROL (5 MG/ML) CONTINUOUS INHALATION SOLN
10.0000 mg/h | INHALATION_SOLUTION | Freq: Once | RESPIRATORY_TRACT | Status: AC
  Administered 2020-03-21: 10 mg/h via RESPIRATORY_TRACT
  Filled 2020-03-20: qty 20

## 2020-03-20 MED ORDER — ACETAMINOPHEN 325 MG PO TABS
650.0000 mg | ORAL_TABLET | Freq: Once | ORAL | Status: AC
  Administered 2020-03-20: 650 mg via ORAL
  Filled 2020-03-20: qty 2

## 2020-03-20 NOTE — ED Notes (Addendum)
Called respiratory with negative POC covid results in order to expedite administration of continuous neb treatment. Respiratory in route.

## 2020-03-20 NOTE — ED Provider Notes (Signed)
[facility] DEPT Provider Note   CSN: [id number] Arrival date & time: 03/20/20  2147     History Chief Complaint  Patient presents with   Shortness of Breath    Julia Daniels is a 70 y.o. female.  HPI      Julia Daniels is a 70 y.o. female, with a history of anxiety, asthma, COPD, presenting to the ED with shortness of breath beginning this evening. Patient states she has been resting and sleeping intermittently today, but woke up around 8 PM this evening with severe shortness of breath.  Once she realized she was quite short of breath, she called her son. She states she has been experiencing a vague sensation of feeling poorly for the last week, mostly with malaise and fatigue. Prior to ED arrival, EMS administered 0.15 mg epinephrine IM, 15 mg albuterol nebulized, 1 mg Atrovent nebulized, 125 mg Solu-Medrol IV, 2 g mag sulfate IV. EMS was called by patient's son.  EMS reports patient was semiresponsive upon their arrival.  They were utilizing CPAP with improvement in the patient's mental status, breathing, and overall presentation until their CPAP unit broke.  Patient is on 2 to 3 L supplemental O2 at baseline. Patient has had 2 Covid vaccination, but no booster. Denies fever/chills, N/V/D, chest pain, abdominal pain, acute cough, syncope, lower extremity edema/pain, or any other complaints.   Past Medical History:  Diagnosis Date   Anxiety    Asthma    COPD (chronic obstructive pulmonary disease) ([place])    Depression     Patient Active Problem List   Diagnosis Date Noted   Anemia 12/05/2019   Acute on chronic respiratory failure ([place]) 12/04/2019   Acute on chronic diastolic CHF (congestive heart failure) ([place]) 12/04/2019   Chronic respiratory failure with hypoxia and hypercapnia ([place]) 11/20/2019   Respiratory failure with hypoxia ([place]) 11/04/2019   Pressure injury of skin 10/22/2019   Chronic diastolic CHF  (congestive heart failure) ([place]) 10/22/2019   Chronic back pain 10/22/2019   Palliative care by specialist    Goals of care, counseling/discussion    DNR (do not resuscitate)    Acute respiratory failure with hypercapnia ([place]) 09/29/2019   Drug induced constipation    Leukocytosis    Supplemental oxygen dependent    Anxiety state    Debility 09/13/2019   Back pain 09/08/2019   Essential hypertension 07/27/2019   Allergic rhinitis 03/14/2018   Acute respiratory failure with hypoxia ([place]) 03/08/2018   Elevated MCV 01/31/2018   Vitamin B12 deficiency 01/31/2018   Acute on chronic respiratory failure with hypoxia ([place]) 01/31/2018   Respiratory failure, acute-on-chronic ([place]) 12/31/2017   COPD exacerbation ([place]) 09/18/2017   Hypoxia    Hyperlipidemia 08/23/2017   Depression 08/23/2017   Anxiety 08/23/2017   Hypercalcemia 08/23/2017   Thrombocytosis 08/23/2017   COPD with acute exacerbation ([place]) 08/23/2017   ADD (attention deficit disorder) 08/30/2012   Bipolar disorder, unspecified ([place]) 08/30/2012   COPD  GOLD ? III with increased Eos and reversibility ? ACOS ?  08/30/2012   Tobacco abuse 08/30/2012    Past Surgical History:  Procedure Laterality Date   ABDOMINAL HYSTERECTOMY     partial      OB History   No obstetric history on file.     Family History  Problem Relation Age of Onset   Asthma Mother    Lung cancer Father    COPD Father     Social History   Tobacco Use  Smoking status: Former Smoker    Packs/day: 1.00    Years: 46.00    Pack years: 46.00    Types: Cigarettes    Start date: 1973    Quit date: 08/23/2017    Years since quitting: 2.5   Smokeless tobacco: Never Used  Substance Use Topics   Alcohol use: No   Drug use: No    Home Medications Prior to Admission medications   Medication Sig Start Date End Date Taking? Authorizing Provider  ALPRAZolam (XANAX) 0.25 MG tablet Take 0.25 mg by mouth 2 (two)  times daily as needed. 03/03/20  Yes [provider]  albuterol (PROAIR HFA) 108 (90 Base) MCG/ACT inhaler Inhale 2 puffs into the lungs every 6 (six) hours as needed for wheezing or shortness of breath. 07/25/19   Tanda Rockers, MD  albuterol (PROVENTIL) (2.5 MG/3ML) 0.083% nebulizer solution Take 3 mLs (2.5 mg total) by nebulization every 6 (six) hours as needed for wheezing or shortness of breath. 09/10/19   Tanda Rockers, MD  apixaban (ELIQUIS) 5 MG TABS tablet Take 1 tablet (5 mg total) by mouth 2 (two) times daily. 12/12/19   Antonieta Pert, MD  atorvastatin (LIPITOR) 40 MG tablet Take 1 tablet (40 mg total) by mouth daily. Patient taking differently: Take 40 mg by mouth at bedtime. 09/12/19 12/04/19  Florencia Reasons, MD  atorvastatin (LIPITOR) 40 MG tablet Take 40 mg by mouth daily.    [provider]  baclofen (LIORESAL) 10 MG tablet Take 10 mg by mouth 3 (three) times daily.  09/05/19   [provider]  barrier cream (NON-SPECIFIED) CREA Apply 1 application topically daily as needed (apply to rash). 11/10/19   Dana Allan I, MD  budesonide (PULMICORT) 0.5 MG/2ML nebulizer solution Take 2 mLs (0.5 mg total) by nebulization 2 (two) times daily. 11/20/19   Tanda Rockers, MD  famotidine (PEPCID) 20 MG tablet One at bedtime 02/23/20   Tanda Rockers, MD  FASENRA 30 MG/ML SOSY INJECT 1 SYRINGE UNDER THE SKIN EVERY 8 WEEKS. 02/10/20   Tanda Rockers, MD  furosemide (LASIX) 40 MG tablet 1 tablet daily (40mg  total); May take additional tablet (40mg ) in the afternoon prn leg swelling/weight gain 03/15/20   Martyn Ehrich, NP  guaiFENesin (MUCINEX) 600 MG 12 hr tablet Take 1 tablet (600 mg total) by mouth 2 (two) times daily. Patient taking differently: Take 1,200 mg by mouth 2 (two) times daily. 09/12/19   Florencia Reasons, MD  HYDROcodone-acetaminophen (NORCO/VICODIN) 5-325 MG tablet Take 1 tablet by mouth every 6 (six) hours as needed for up to 4 doses for severe pain. 12/12/19   Antonieta Pert,  MD  Infant Care Products [facility]) OINT Apply 1 application topically daily as needed (rash).    [provider]  lamoTRIgine (LAMICTAL) 200 MG tablet Take 200 mg by mouth at bedtime.     [provider]  lidocaine (LIDODERM) 5 % Place 1 patch onto the skin daily. Remove & Discard patch within 12 hours or as directed by MD 09/12/19   Florencia Reasons, MD  loratadine (CLARITIN) 10 MG tablet Take 10 mg by mouth every evening.     [provider]  montelukast (SINGULAIR) 10 MG tablet Take 1 tablet (10 mg total) by mouth at bedtime. 02/23/20   Tanda Rockers, MD  pantoprazole (PROTONIX) 40 MG tablet Take 1 tablet (40 mg total) by mouth daily. Take 30-60 min before first meal of the day 07/25/19   Christinia Gully  B, MD  Potassium Chloride ER 20 MEQ TBCR TAKE 2 TABLETS BY MOUTH DAILY FOR 3 DAYS( WHILE ON HIGHER DOSE LASIX) 03/10/20   Martyn Ehrich, NP  predniSONE (DELTASONE) 10 MG tablet Take 1 tablet (10 mg total) by mouth daily with breakfast. 12/13/19   Antonieta Pert, MD  predniSONE (DELTASONE) 10 MG tablet Take 4 tabs po daily x 3 days; then 3 tabs daily x3 days; then resume regular dose 03/05/20   Martyn Ehrich, NP  QUEtiapine (SEROQUEL) 100 MG tablet Take 1.5 tablets (150 mg total) by mouth at bedtime. 11/10/19   Dana Allan I, MD  risperiDONE (RISPERDAL) 3 MG tablet Take 1 tablet (3 mg total) by mouth at bedtime. 11/10/19   Bonnell Public, MD  senna-docusate (SENOKOT-S) 8.6-50 MG tablet Take 1 tablet by mouth 2 (two) times daily. 09/12/19   Florencia Reasons, MD  Tiotropium Bromide-Olodaterol (STIOLTO RESPIMAT) 2.5-2.5 MCG/ACT AERS Inhale 2 puffs into the lungs daily. 11/20/19   Tanda Rockers, MD  venlafaxine XR (EFFEXOR-XR) 150 MG 24 hr capsule Take 150 mg by mouth at bedtime.     [provider]  vitamin B-12 (CYANOCOBALAMIN) 500 MCG tablet Take 1 tablet (500 mcg total) by mouth daily. 11/11/19   Bonnell Public, MD  Vitamin D, Ergocalciferol, (DRISDOL) 50000 units  CAPS capsule Take 50,000 Units by mouth every Saturday.  06/18/17   [provider]    Allergies    Sulfa antibiotics  Review of Systems   Review of Systems  Constitutional: Negative for chills, diaphoresis and fever.  Respiratory: Positive for shortness of breath. Negative for cough.   Cardiovascular: Negative for chest pain and leg swelling.  Gastrointestinal: Negative for abdominal pain, diarrhea, nausea and vomiting.  Neurological: Negative for syncope and weakness.  All other systems reviewed and are negative.   Physical Exam Updated Vital Signs BP (!) 88/72    Pulse (!) 104    Temp 98.3 F (36.8 C) (Oral)    Resp 20    Ht 5\' 3"  (1.6 m)    Wt 90.3 kg    SpO2 98%    BMI 35.25 kg/m   Physical Exam Vitals and nursing note reviewed.  Constitutional:      General: She is not in acute distress.    Appearance: She is well-developed. She is not diaphoretic.  HENT:     Head: Normocephalic and atraumatic.     Mouth/Throat:     Mouth: Mucous membranes are moist.     Pharynx: Oropharynx is clear.  Eyes:     Conjunctiva/sclera: Conjunctivae normal.  Cardiovascular:     Rate and Rhythm: Regular rhythm. Tachycardia present.     Pulses: Normal pulses.          Radial pulses are 2+ on the right side and 2+ on the left side.       Posterior tibial pulses are 2+ on the right side and 2+ on the left side.     Heart sounds: Normal heart sounds.     Comments: Tactile temperature in the extremities appropriate and equal bilaterally. Pulmonary:     Effort: Tachypnea and respiratory distress present.     Breath sounds: Decreased breath sounds and wheezing present.     Comments: Increased work of breathing.  Patient receiving nebulizer treatment via mask with 10 L O2. Abdominal:     Palpations: Abdomen is soft.     Tenderness: There is no abdominal tenderness. There is no guarding.  Musculoskeletal:  Cervical back: Neck supple.     Right lower leg: No edema.     Left lower  leg: No edema.  Lymphadenopathy:     Cervical: No cervical adenopathy.  Skin:    General: Skin is warm and dry.  Neurological:     Mental Status: She is alert.  Psychiatric:        Mood and Affect: Mood and affect normal.        Speech: Speech normal.        Behavior: Behavior normal.     ED Results / Procedures / Treatments   Labs (all labs ordered are listed, but only abnormal results are displayed) Labs Reviewed  BASIC METABOLIC PANEL - Abnormal; Notable for the following components:      Result Value   Glucose, Bld 130 (*)    All other components within normal limits  CBC WITH DIFFERENTIAL/PLATELET - Abnormal; Notable for the following components:   WBC 12.7 (*)    RBC 3.40 (*)    Hemoglobin 10.8 (*)    MCV 107.9 (*)    MCHC 29.4 (*)    Neutro Abs 10.7 (*)    All other components within normal limits  RESP PANEL BY RT-PCR (FLU A&B, COVID) ARPGX2  BRAIN NATRIURETIC PEPTIDE  POC SARS CORONAVIRUS 2 AG -  ED  TROPONIN I (HIGH SENSITIVITY)  TROPONIN I (HIGH SENSITIVITY)    Hemoglobin  Date Value Ref Range Status  03/20/2020 10.8 (L) 12.0 - 15.0 g/dL Final  12/10/2019 11.9 (L) 12.0 - 15.0 g/dL Final  12/09/2019 12.2 12.0 - 15.0 g/dL Final  12/08/2019 12.9 12.0 - 15.0 g/dL Final       EKG EKG Interpretation  Date/Time:  Saturday March 20 2020 22:28:33 EST Ventricular Rate:  109 PR Interval:    QRS Duration: 100 QT Interval:  337 QTC Calculation: 454 R Axis:   79 Text Interpretation: Sinus tachycardia Artifact Abnormal ECG Confirmed by Carmin Muskrat [phone]) on 03/20/2020 10:33:22 PM   Radiology DG Chest 2 View  Result Date: 03/20/2020 CLINICAL DATA:  Short of breath, asthma, COPD EXAM: CHEST - 2 VIEW COMPARISON:  03/05/2020 FINDINGS: Frontal and lateral views of the chest demonstrates stable enlargement of the cardiac silhouette. Chronic ectasia of the thoracic aorta. Chronic areas of scarring are seen throughout the lungs. There is central vascular  congestion, with increased interstitial prominence compatible with interstitial edema. No acute airspace disease, effusion, or pneumothorax. No acute bony abnormalities. IMPRESSION: 1. Worsening volume status, with central vascular congestion and interstitial edema. Electronically Signed   By: Randa Ngo M.D.   On: 03/20/2020 22:35    Procedures .Critical Care Performed by: Lorayne Bender, PA-C Authorized by: Lorayne Bender, PA-C   Critical care provider statement:    Critical care time (minutes):  60   Critical care time was exclusive of:  Separately billable procedures and treating other patients   Critical care was necessary to treat or prevent imminent or life-threatening deterioration of the following conditions:  Respiratory failure   Critical care was time spent personally by me on the following activities:  Ordering and performing treatments and interventions, ordering and review of laboratory studies, ordering and review of radiographic studies, pulse oximetry, re-evaluation of patient's condition, review of old charts, development of treatment plan with patient or surrogate, discussions with consultants, evaluation of patient's response to treatment, examination of patient and obtaining history from patient or surrogate   I assumed direction of critical care for this patient from  another provider in my specialty: no    Ultrasound ED Peripheral IV (Provider)  Date/Time: 03/21/2020 12:35 AM Performed by: Lorayne Bender, PA-C Authorized by: Lorayne Bender, PA-C   Procedure details:    Indications: hypotension, multiple failed IV attempts and poor IV access     Skin Prep: chlorhexidine gluconate     Location:  Right AC   Angiocath:  20 G   Bedside Ultrasound Guided: Yes     Images: archived     Patient tolerated procedure without complications: Yes     Dressing applied: Yes   Comments:     Positive flash.  Advanced under ultrasound guidance and flushed without pain, swelling, or other  signs of infiltration.     Medications Ordered in ED Medications  acetaminophen (TYLENOL) tablet 650 mg (650 mg Oral Given 03/20/20 2301)  albuterol (PROVENTIL,VENTOLIN) solution continuous neb (10 mg/hr Nebulization Given 03/21/20 0002)  LORazepam (ATIVAN) injection 1 mg (0.5 mg Intravenous Given 03/21/20 0010)    ED Course  I have reviewed the triage vital signs and the nursing notes.  Pertinent labs & imaging results that were available during my care of the patient were reviewed by me and considered in my medical decision making (see chart for details).  Clinical Course as of 03/21/20 0106  Sat Mar 20, 2020  2247 We tried to remove the patient from her 10 L/min supplemental O2 via mask and put her on 6 L supplemental O2 via nasal cannula.  She stated she was doing okay for several minutes, however, then felt she was getting worse, SPO2 dropped below 91%, and patient was placed back on mask. [SJ]  2335 Spoke with Amy, respiratory therapist.  She is currently with a critical trach placement and will work on getting someone down to the ED or she will come herself. [SJ]  2337 BP(!): 88/72 Repeat BP/manual check is 96/62. [SJ]  Sun Mar 21, 2020  0020 Spoke with Dr. Trilby Drummer, hospitalist.  Since patient had a hypotensive blood pressure reading, recommends contacting ICU if these pressures continue.  If we can show trended stability of nonhypotensive blood pressures, he will reevaluate admitting the patient. [SJ]    Clinical Course User Index [SJ] Nakoma Gotwalt, Helane Gunther, PA-C   MDM Rules/Calculators/A&P                          Patient presents with shortness of breath beginning this evening. Hypoxic upon EMS arrival.  Alert and oriented upon arrival in the ED. Afebrile.  Ill-appearing. Increased work of breathing with increased oxygen requirement here in the ED.  Patient requiring continuous albuterol nebulizer and BiPAP, improvement with these interventions. Patient had one hypotensive blood  pressure reading with an undesirable MAP, but without mental status compromise. Patient will need to be admitted for further management.  She states she is DNR, but would accept pressor agents, if necessary.  Patient is critically ill.  Findings and plan of care discussed with attending physician, Adrian Prows, MD, and then with Dr. Roxanne Mins after EDP shift change. Dr. Vanita Panda personally evaluated and examined this patient.  End of shift patient care handoff report given to [employer], PA-C. Plan: Continue to trend the patient's blood pressures to assure continued hemodynamic stability.  Admit the patient.  Vitals:   03/21/20 0000 03/21/20 0045 03/21/20 0046 03/21/20 0057  BP: (!) 69/57 132/68 135/70 129/73  Pulse: (!) 107   (!) 107  Resp: 18 (!) 22  16  Temp:      TempSrc:      SpO2: 98%   95%  Weight:      Height:         Final Clinical Impression(s) / ED Diagnoses Final diagnoses:  Shortness of breath  COPD exacerbation ([place])  Acute pulmonary edema [facility])    Rx / DC Orders ED Discharge Orders    None       Layla Maw 03/21/20 0116    Carmin Muskrat, MD 03/21/20 2359

## 2020-03-20 NOTE — ED Triage Notes (Signed)
Pt from home via EMS for shortness of breath, chronic COPD, patient has been on BIPAP before, Pt found supine in bed with increased WOB, 32-48RR, CPAP broke off wall in ambulance.

## 2020-03-21 ENCOUNTER — Inpatient Hospital Stay (HOSPITAL_COMMUNITY): Payer: Self-pay

## 2020-03-21 ENCOUNTER — Encounter (HOSPITAL_COMMUNITY): Payer: Self-pay | Admitting: Family Medicine

## 2020-03-21 DIAGNOSIS — J9621 Acute and chronic respiratory failure with hypoxia: Secondary | ICD-10-CM

## 2020-03-21 DIAGNOSIS — J9622 Acute and chronic respiratory failure with hypercapnia: Secondary | ICD-10-CM

## 2020-03-21 DIAGNOSIS — I5033 Acute on chronic diastolic (congestive) heart failure: Secondary | ICD-10-CM

## 2020-03-21 LAB — ECHOCARDIOGRAM COMPLETE
Area-P 1/2: 3.17 cm2
Calc EF: 54 %
Height: 63 in
S' Lateral: 3.4 cm
Single Plane A2C EF: 52.2 %
Single Plane A4C EF: 57.6 %
Weight: 3689.62 oz

## 2020-03-21 LAB — HIV ANTIBODY (ROUTINE TESTING W REFLEX): HIV Screen 4th Generation wRfx: NONREACTIVE

## 2020-03-21 LAB — BRAIN NATRIURETIC PEPTIDE: B Natriuretic Peptide: 76.8 pg/mL (ref 0.0–100.0)

## 2020-03-21 LAB — TROPONIN I (HIGH SENSITIVITY)
Troponin I (High Sensitivity): 39 ng/L — ABNORMAL HIGH (ref <18)
Troponin I (High Sensitivity): 50 ng/L — ABNORMAL HIGH (ref <18)
Troponin I (High Sensitivity): 49 ng/L — ABNORMAL HIGH (ref <18)
Troponin I (High Sensitivity): 41 ng/L — ABNORMAL HIGH (ref <18)

## 2020-03-21 LAB — RESP PANEL BY RT-PCR (FLU A&B, COVID) ARPGX2
Influenza A by PCR: NEGATIVE
Influenza B by PCR: NEGATIVE
SARS Coronavirus 2 by RT PCR: NEGATIVE

## 2020-03-21 LAB — LIPID PANEL
Cholesterol: 213 mg/dL — ABNORMAL HIGH (ref 0–200)
HDL: 73 mg/dL (ref >40)
LDL Cholesterol: 131 mg/dL — ABNORMAL HIGH (ref 0–99)
Total CHOL/HDL Ratio: 2.9 RATIO
Triglycerides: 44 mg/dL (ref <150)
VLDL: 9 mg/dL (ref 0–40)

## 2020-03-21 MED ORDER — IPRATROPIUM-ALBUTEROL 0.5-2.5 (3) MG/3ML IN SOLN
3.0000 mL | Freq: Four times a day (QID) | RESPIRATORY_TRACT | Status: DC
  Administered 2020-03-21 – 2020-03-23 (×10): 3 mL via RESPIRATORY_TRACT
  Filled 2020-03-21 (×10): qty 3

## 2020-03-21 MED ORDER — ONDANSETRON HCL 4 MG/2ML IJ SOLN
4.0000 mg | Freq: Three times a day (TID) | INTRAMUSCULAR | Status: DC | PRN
  Administered 2020-03-21: 4 mg via INTRAVENOUS
  Filled 2020-03-21: qty 2

## 2020-03-21 MED ORDER — ASPIRIN 325 MG PO TABS
325.0000 mg | ORAL_TABLET | Freq: Once | ORAL | Status: AC
  Administered 2020-03-21: 325 mg via ORAL
  Filled 2020-03-21: qty 1

## 2020-03-21 MED ORDER — BACLOFEN 10 MG PO TABS
10.0000 mg | ORAL_TABLET | Freq: Three times a day (TID) | ORAL | Status: DC
  Administered 2020-03-21 – 2020-03-25 (×12): 10 mg via ORAL
  Filled 2020-03-21 (×12): qty 1

## 2020-03-21 MED ORDER — BUDESONIDE 0.5 MG/2ML IN SUSP
0.5000 mg | Freq: Two times a day (BID) | RESPIRATORY_TRACT | Status: DC
  Administered 2020-03-21 – 2020-03-25 (×9): 0.5 mg via RESPIRATORY_TRACT
  Filled 2020-03-21 (×9): qty 2

## 2020-03-21 MED ORDER — LAMOTRIGINE 100 MG PO TABS
200.0000 mg | ORAL_TABLET | Freq: Every day | ORAL | Status: DC
  Administered 2020-03-21 – 2020-03-31 (×11): 200 mg via ORAL
  Filled 2020-03-21 (×11): qty 2

## 2020-03-21 MED ORDER — SENNOSIDES-DOCUSATE SODIUM 8.6-50 MG PO TABS
1.0000 | ORAL_TABLET | Freq: Two times a day (BID) | ORAL | Status: DC
  Administered 2020-03-21 – 2020-04-01 (×19): 1 via ORAL
  Filled 2020-03-21 (×22): qty 1

## 2020-03-21 MED ORDER — LORATADINE 10 MG PO TABS
10.0000 mg | ORAL_TABLET | Freq: Every evening | ORAL | Status: DC
  Administered 2020-03-21 – 2020-03-31 (×11): 10 mg via ORAL
  Filled 2020-03-21 (×11): qty 1

## 2020-03-21 MED ORDER — QUETIAPINE FUMARATE 50 MG PO TABS
150.0000 mg | ORAL_TABLET | Freq: Once | ORAL | Status: AC
  Administered 2020-03-21: 150 mg via ORAL
  Filled 2020-03-21: qty 1

## 2020-03-21 MED ORDER — VENLAFAXINE HCL ER 150 MG PO CP24
150.0000 mg | ORAL_CAPSULE | Freq: Every day | ORAL | Status: DC
  Administered 2020-03-21 – 2020-03-31 (×11): 150 mg via ORAL
  Filled 2020-03-21 (×12): qty 1

## 2020-03-21 MED ORDER — ATORVASTATIN CALCIUM 40 MG PO TABS
40.0000 mg | ORAL_TABLET | Freq: Every day | ORAL | Status: DC
  Administered 2020-03-21: 40 mg via ORAL
  Filled 2020-03-21: qty 1

## 2020-03-21 MED ORDER — ORAL CARE MOUTH RINSE
15.0000 mL | Freq: Two times a day (BID) | OROMUCOSAL | Status: DC
  Administered 2020-03-22 – 2020-03-30 (×12): 15 mL via OROMUCOSAL

## 2020-03-21 MED ORDER — METHYLPREDNISOLONE SODIUM SUCC 125 MG IJ SOLR
60.0000 mg | Freq: Four times a day (QID) | INTRAMUSCULAR | Status: AC
  Administered 2020-03-21 – 2020-03-22 (×4): 60 mg via INTRAVENOUS
  Filled 2020-03-21 (×4): qty 2

## 2020-03-21 MED ORDER — SODIUM CHLORIDE 0.9 % IV SOLN
500.0000 mg | INTRAVENOUS | Status: AC
  Administered 2020-03-21: 500 mg via INTRAVENOUS
  Filled 2020-03-21: qty 500

## 2020-03-21 MED ORDER — ASPIRIN EC 81 MG PO TBEC
81.0000 mg | DELAYED_RELEASE_TABLET | Freq: Every day | ORAL | Status: DC
  Administered 2020-03-22 – 2020-03-23 (×2): 81 mg via ORAL
  Filled 2020-03-21 (×2): qty 1

## 2020-03-21 MED ORDER — FUROSEMIDE 10 MG/ML IJ SOLN: 40.0000 mg | Freq: Once | INTRAMUSCULAR | Status: DC

## 2020-03-21 MED ORDER — FAMOTIDINE 20 MG PO TABS
20.0000 mg | ORAL_TABLET | Freq: Every day | ORAL | Status: DC
  Administered 2020-03-21 – 2020-03-31 (×11): 20 mg via ORAL
  Filled 2020-03-21 (×11): qty 1

## 2020-03-21 MED ORDER — PERFLUTREN LIPID MICROSPHERE
1.0000 mL | INTRAVENOUS | Status: DC | PRN
  Administered 2020-03-21: 2 mL via INTRAVENOUS
  Filled 2020-03-21: qty 10

## 2020-03-21 MED ORDER — POTASSIUM CHLORIDE 20 MEQ PO PACK
20.0000 meq | PACK | Freq: Every day | ORAL | Status: DC
  Administered 2020-03-21 – 2020-03-22 (×2): 20 meq via ORAL
  Filled 2020-03-21 (×3): qty 1

## 2020-03-21 MED ORDER — PANTOPRAZOLE SODIUM 40 MG PO TBEC
40.0000 mg | DELAYED_RELEASE_TABLET | Freq: Every day | ORAL | Status: DC
  Administered 2020-03-21 – 2020-04-01 (×11): 40 mg via ORAL
  Filled 2020-03-21 (×11): qty 1

## 2020-03-21 MED ORDER — FUROSEMIDE 10 MG/ML IJ SOLN
40.0000 mg | Freq: Every day | INTRAMUSCULAR | Status: DC
  Administered 2020-03-21 – 2020-03-22 (×2): 40 mg via INTRAVENOUS
  Filled 2020-03-21 (×2): qty 4

## 2020-03-21 MED ORDER — IPRATROPIUM-ALBUTEROL 0.5-2.5 (3) MG/3ML IN SOLN
3.0000 mL | Freq: Four times a day (QID) | RESPIRATORY_TRACT | Status: DC | PRN
  Administered 2020-03-21: 3 mL via RESPIRATORY_TRACT
  Filled 2020-03-21: qty 3

## 2020-03-21 MED ORDER — APIXABAN 5 MG PO TABS
5.0000 mg | ORAL_TABLET | Freq: Two times a day (BID) | ORAL | Status: DC
  Administered 2020-03-21 – 2020-03-22 (×3): 5 mg via ORAL
  Filled 2020-03-21 (×3): qty 1

## 2020-03-21 MED ORDER — FLUTICASONE FUROATE-VILANTEROL 200-25 MCG/INH IN AEPB
1.0000 | INHALATION_SPRAY | Freq: Every day | RESPIRATORY_TRACT | Status: DC
  Administered 2020-03-21 – 2020-04-01 (×11): 1 via RESPIRATORY_TRACT
  Filled 2020-03-21 (×2): qty 28

## 2020-03-21 MED ORDER — ALBUTEROL SULFATE (2.5 MG/3ML) 0.083% IN NEBU
2.5000 mg | INHALATION_SOLUTION | RESPIRATORY_TRACT | Status: DC | PRN
  Administered 2020-03-25 – 2020-03-30 (×3): 2.5 mg via RESPIRATORY_TRACT
  Filled 2020-03-21 (×3): qty 3

## 2020-03-21 MED ORDER — VITAMIN B-12 100 MCG PO TABS
500.0000 ug | ORAL_TABLET | Freq: Every day | ORAL | Status: DC
  Administered 2020-03-21 – 2020-04-01 (×11): 500 ug via ORAL
  Filled 2020-03-21: qty 5
  Filled 2020-03-21: qty 1
  Filled 2020-03-21 (×3): qty 5
  Filled 2020-03-21: qty 1
  Filled 2020-03-21: qty 5
  Filled 2020-03-21: qty 1
  Filled 2020-03-21 (×3): qty 5

## 2020-03-21 MED ORDER — PREDNISONE 20 MG PO TABS
40.0000 mg | ORAL_TABLET | Freq: Every day | ORAL | Status: AC
  Administered 2020-03-22 – 2020-03-25 (×3): 40 mg via ORAL
  Filled 2020-03-21 (×3): qty 2

## 2020-03-21 MED ORDER — AZITHROMYCIN 250 MG PO TABS
500.0000 mg | ORAL_TABLET | Freq: Every day | ORAL | Status: AC
  Administered 2020-03-22 – 2020-03-25 (×3): 500 mg via ORAL
  Filled 2020-03-21 (×4): qty 2

## 2020-03-21 MED ORDER — ALPRAZOLAM 0.25 MG PO TABS
0.2500 mg | ORAL_TABLET | Freq: Two times a day (BID) | ORAL | Status: DC | PRN
  Administered 2020-03-22 – 2020-03-31 (×12): 0.25 mg via ORAL
  Filled 2020-03-21 (×12): qty 1

## 2020-03-21 MED ORDER — CHLORHEXIDINE GLUCONATE 0.12 % MT SOLN
15.0000 mL | Freq: Two times a day (BID) | OROMUCOSAL | Status: DC
  Administered 2020-03-21 – 2020-04-01 (×19): 15 mL via OROMUCOSAL
  Filled 2020-03-21 (×19): qty 15

## 2020-03-21 MED ORDER — RISPERIDONE 2 MG PO TABS
2.0000 mg | ORAL_TABLET | Freq: Every day | ORAL | Status: DC
  Administered 2020-03-21 – 2020-03-31 (×11): 2 mg via ORAL
  Filled 2020-03-21: qty 1
  Filled 2020-03-21: qty 2
  Filled 2020-03-21: qty 1
  Filled 2020-03-21: qty 2
  Filled 2020-03-21 (×3): qty 1
  Filled 2020-03-21: qty 2
  Filled 2020-03-21 (×4): qty 1

## 2020-03-21 NOTE — Progress Notes (Signed)
  Echocardiogram 2D Echocardiogram has been performed.  Bobbye [place] 03/21/2020, 10:59 AM

## 2020-03-21 NOTE — H&P (Signed)
History and Physical   [facility] [place] [facility] [place] [place] Admission History and Physical [employer], D.O.    Patient Name: Julia Daniels MR#: [id number] Date of Birth: 1950/01/19 Date of Admission: 03/20/2020  Referring MD/NP/PA: PA Rob Primary Care Physician: Simona Huh, NP  Chief Complaint:  Chief Complaint  Patient presents with  . Shortness of Breath    HPI: Julia Daniels is a 70 y.o. female with a known history of anxiety, asthma, COPD on 2 L O2 at home, chronic diastolic CHF, chronic back pain presents to the emergency department for evaluation of shortness of breath.  Patient was in a usual state of health until about 1 hour prior to arrival when she described a sudden onset shortness of breath that is worse than usual.  She has been feeling generally unwell with no specific symptoms for the past week.  EMS administered epinephrine, albuterol, Atrovent, Solu-Medrol and magnesium and she was placed on CPAP.  In the emergency department she was initially hypotensive but has stabilized.  She is currently on BiPAP and states that she is feeling much better.   Patient denies LE edema, fevers/chills, weakness, dizziness, chest pain, N/V/C/D, abdominal pain, dysuria/frequency, changes in mental status.    Otherwise there has been no change in status. Patient has been taking medication as prescribed and there has been no recent change in medication or diet.  No recent antibiotics.  There has been no recent illness, hospitalizations, travel or sick contacts.    Medical admission has been requested for further management of acute on chronic respiratory failure 2/2 COPD and CHF exacerbation.  Review of Systems:  CONSTITUTIONAL: Positive fatigue, weakness, No fever/chills, weight gain/loss, headache. EYES: No blurry or double vision. ENT: No tinnitus, postnasal drip, redness or soreness of the oropharynx. RESPIRATORY: Positive cough, dyspnea, wheeze.  No  hemoptysis.  CARDIOVASCULAR: No chest pain, palpitations, syncope, orthopnea. No lower extremity edema.  GASTROINTESTINAL: No nausea, vomiting, abdominal pain, diarrhea, constipation.  No hematemesis, melena or hematochezia. GENITOURINARY: No dysuria, frequency, hematuria. ENDOCRINE: No polyuria or nocturia. No heat or cold intolerance. HEMATOLOGY: No anemia, bruising, bleeding. INTEGUMENTARY: No rashes, ulcers, lesions. MUSCULOSKELETAL: No arthritis, gout,NEUROLOGIC: No numbness, tingling, ataxia, seizure-type activity, weakness. PSYCHIATRIC: No anxiety, depression, insomnia.   Past Medical History:  Diagnosis Date  . Anxiety   . Asthma   . COPD (chronic obstructive pulmonary disease) ([place])   . Depression     Past Surgical History:  Procedure Laterality Date  . ABDOMINAL HYSTERECTOMY     partial      reports that she quit smoking about 2 years ago. Her smoking use included cigarettes. She started smoking about 49 years ago. She has a 46.00 pack-year smoking history. She has never used smokeless tobacco. She reports that she does not drink alcohol and does not use drugs.  Allergies  Allergen Reactions  . Sulfa Antibiotics Other (See Comments)    Mouth gets raw    Family History  Problem Relation Age of Onset  . Asthma Mother   . Lung cancer Father   . COPD Father     Prior to Admission medications   Medication Sig Start Date End Date Taking? Authorizing Provider  albuterol (PROAIR HFA) 108 (90 Base) MCG/ACT inhaler Inhale 2 puffs into the lungs every 6 (six) hours as needed for wheezing or shortness of breath. 07/25/19   Tanda Rockers, MD  albuterol (PROVENTIL) (2.5 MG/3ML) 0.083% nebulizer solution Take 3 mLs (2.5 mg total) by nebulization every 6 (  six) hours as needed for wheezing or shortness of breath. 09/10/19   Tanda Rockers, MD  ALPRAZolam Duanne Moron) 0.25 MG tablet Take 0.25 mg by mouth 2 (two) times daily as needed. 03/03/20   [provider]  apixaban  (ELIQUIS) 5 MG TABS tablet Take 1 tablet (5 mg total) by mouth 2 (two) times daily. 12/12/19   Antonieta Pert, MD  atorvastatin (LIPITOR) 40 MG tablet Take 1 tablet (40 mg total) by mouth daily. Patient not taking: No sig reported 09/12/19 12/04/19  Florencia Reasons, MD  baclofen (LIORESAL) 10 MG tablet Take 10 mg by mouth 3 (three) times daily.  09/05/19   [provider]  barrier cream (NON-SPECIFIED) CREA Apply 1 application topically daily as needed (apply to rash). 11/10/19   Dana Allan I, MD  budesonide (PULMICORT) 0.5 MG/2ML nebulizer solution Take 2 mLs (0.5 mg total) by nebulization 2 (two) times daily. 11/20/19   Tanda Rockers, MD  budesonide-formoterol (SYMBICORT) 160-4.5 MCG/ACT inhaler Inhale 2 puffs into the lungs 2 (two) times daily.    [provider]  famotidine (PEPCID) 20 MG tablet One at bedtime 02/23/20   Tanda Rockers, MD  FASENRA 30 MG/ML SOSY INJECT 1 SYRINGE UNDER THE SKIN EVERY 8 WEEKS. Patient not taking: Reported on 03/21/2020 02/10/20   Tanda Rockers, MD  furosemide (LASIX) 40 MG tablet 1 tablet daily (40mg  total); May take additional tablet (40mg ) in the afternoon prn leg swelling/weight gain 03/15/20   Martyn Ehrich, NP  guaiFENesin (MUCINEX) 600 MG 12 hr tablet Take 1 tablet (600 mg total) by mouth 2 (two) times daily. Patient taking differently: Take 1,200 mg by mouth 2 (two) times daily. 09/12/19   Florencia Reasons, MD  HYDROcodone-acetaminophen (NORCO/VICODIN) 5-325 MG tablet Take 1 tablet by mouth every 6 (six) hours as needed for up to 4 doses for severe pain. Patient not taking: Reported on 03/21/2020 12/12/19   Antonieta Pert, MD  [employer] [facility]) OINT Apply 1 application topically daily as needed (rash).    [provider]  lamoTRIgine (LAMICTAL) 200 MG tablet Take 200 mg by mouth at bedtime.     [provider]  lidocaine (LIDODERM) 5 % Place 1 patch onto the skin daily. Remove & Discard patch within 12 hours or as directed by  MD 09/12/19   Florencia Reasons, MD  loratadine (CLARITIN) 10 MG tablet Take 10 mg by mouth every evening.     [provider]  montelukast (SINGULAIR) 10 MG tablet Take 1 tablet (10 mg total) by mouth at bedtime. Patient not taking: Reported on 03/21/2020 02/23/20   Tanda Rockers, MD  pantoprazole (PROTONIX) 40 MG tablet Take 1 tablet (40 mg total) by mouth daily. Take 30-60 min before first meal of the day 07/25/19   Tanda Rockers, MD  Potassium Chloride ER 20 MEQ TBCR TAKE 2 TABLETS BY MOUTH DAILY FOR 3 DAYS( WHILE ON HIGHER DOSE LASIX) 03/10/20   Martyn Ehrich, NP  predniSONE (DELTASONE) 10 MG tablet Take 1 tablet (10 mg total) by mouth daily with breakfast. Patient not taking: Reported on 03/21/2020 12/13/19   Antonieta Pert, MD  predniSONE (DELTASONE) 10 MG tablet Take 4 tabs po daily x 3 days; then 3 tabs daily x3 days; then resume regular dose Patient not taking: Reported on 03/21/2020 03/05/20   Martyn Ehrich, NP  QUEtiapine (SEROQUEL) 100 MG tablet Take 1.5 tablets (150 mg total) by mouth at bedtime. Patient not taking: Reported on 03/21/2020  11/10/19   Dana Allan I, MD  risperiDONE (RISPERDAL) 1 MG tablet Take 2 mg by mouth at bedtime.    [provider]  risperiDONE (RISPERDAL) 3 MG tablet Take 1 tablet (3 mg total) by mouth at bedtime. Patient not taking: Reported on 03/21/2020 11/10/19   Dana Allan I, MD  senna-docusate (SENOKOT-S) 8.6-50 MG tablet Take 1 tablet by mouth 2 (two) times daily. 09/12/19   Florencia Reasons, MD  Tiotropium Bromide-Olodaterol (STIOLTO RESPIMAT) 2.5-2.5 MCG/ACT AERS Inhale 2 puffs into the lungs daily. 11/20/19   Tanda Rockers, MD  venlafaxine XR (EFFEXOR-XR) 150 MG 24 hr capsule Take 150 mg by mouth at bedtime.     [provider]  vitamin B-12 (CYANOCOBALAMIN) 500 MCG tablet Take 1 tablet (500 mcg total) by mouth daily. 11/11/19   Bonnell Public, MD  Vitamin D, Ergocalciferol, (DRISDOL) 50000 units CAPS capsule Take 50,000 Units by  mouth every Saturday.  06/18/17   [provider]    Physical Exam: Vitals:   03/21/20 0045 03/21/20 0046 03/21/20 0057 03/21/20 0119  BP: 132/68 135/70 129/73 129/73  Pulse:   (!) 107 (!) 104  Resp: (!) 22  16 15   Temp:      TempSrc:      SpO2:   95% 91%  Weight:      Height:        GENERAL: 70 y.o.-year-old white female patient, well-developed, well-nourished lying in the bed in no acute distress.  Pleasant and cooperative.   HEENT: Head atraumatic, normocephalic. Pupils equal. Mucus membranes moist.  BiPAP mask in place.  NECK: Supple. No JVD. CHEST: Mild diffuse wheezing.  Speaking full sentences. No use of accessory muscles of respiration.  No reproducible chest wall tenderness.  CARDIOVASCULAR: S1, S2 normal, regular. No LE edema. ABDOMEN: Soft, nondistended, nontender. No rebound, guarding, rigidity. Normoactive bowel sounds present in all four quadrants.  EXTREMITIES: No pedal edema, cyanosis, or clubbing. No calf tenderness or Homan's sign.  NEUROLOGIC: The patient is alert and oriented x 3. Cranial nerves II through XII are grossly intact with no focal sensorimotor deficit. PSYCHIATRIC:  Normal affect, mood, thought content.   Labs on Admission:  CBC: Recent Labs  Lab 03/20/20 2234  WBC 12.7*  NEUTROABS 10.7*  HGB 10.8*  HCT 36.7  MCV 107.9*  PLT 098   Basic Metabolic Panel: Recent Labs  Lab 03/20/20 2150  NA 141  K 4.9  CL 99  CO2 32  GLUCOSE 130*  BUN 12  CREATININE 0.70  CALCIUM 9.9   GFR: Estimated Creatinine Clearance: 72.8 mL/min (by C-G formula based on SCr of 0.7 mg/dL). Liver Function Tests: No results for input(s): AST, ALT, ALKPHOS, BILITOT, PROT, ALBUMIN in the last 168 hours. No results for input(s): LIPASE, AMYLASE in the last 168 hours. No results for input(s): AMMONIA in the last 168 hours. Coagulation Profile: No results for input(s): INR, PROTIME in the last 168 hours. Cardiac Enzymes: No results for input(s): CKTOTAL,  CKMB, CKMBINDEX, TROPONINI in the last 168 hours. BNP (last 3 results) No results for input(s): PROBNP in the last 8760 hours. HbA1C: No results for input(s): HGBA1C in the last 72 hours. CBG: No results for input(s): GLUCAP in the last 168 hours. Lipid Profile: No results for input(s): CHOL, HDL, LDLCALC, TRIG, CHOLHDL, LDLDIRECT in the last 72 hours. Thyroid Function Tests: No results for input(s): TSH, T4TOTAL, FREET4, T3FREE, THYROIDAB in the last 72 hours. Anemia Panel: No results for input(s): VITAMINB12, FOLATE, FERRITIN, TIBC,  IRON, RETICCTPCT in the last 72 hours. Urine analysis:    Component Value Date/Time   COLORURINE YELLOW 09/10/2019 1302   APPEARANCEUR CLEAR 09/10/2019 1302   LABSPEC 1.018 09/10/2019 1302   PHURINE 5.0 09/10/2019 1302   GLUCOSEU NEGATIVE 09/10/2019 1302   HGBUR NEGATIVE 09/10/2019 1302   BILIRUBINUR NEGATIVE 09/10/2019 1302   BILIRUBINUR n 09/23/2012 1111   KETONESUR NEGATIVE 09/10/2019 1302   PROTEINUR NEGATIVE 09/10/2019 1302   UROBILINOGEN 0.2 09/23/2012 1111   NITRITE NEGATIVE 09/10/2019 1302   LEUKOCYTESUR NEGATIVE 09/10/2019 1302   Sepsis Labs: @LABRCNTIP (procalcitonin:4,lacticidven:4) ) Recent Results (from the past 240 hour(s))  Resp Panel by RT-PCR (Flu A&B, Covid) Nasopharyngeal Swab     Status: None   Collection Time: 03/20/20  9:50 PM   Specimen: Nasopharyngeal Swab; Nasopharyngeal(NP) swabs in vial transport medium  Result Value Ref Range Status   SARS Coronavirus 2 by RT PCR NEGATIVE NEGATIVE Final    Comment: (NOTE) SARS-CoV-2 target nucleic acids are NOT DETECTED.  The SARS-CoV-2 RNA is generally detectable in upper respiratory specimens during the acute phase of infection. The lowest concentration of SARS-CoV-2 viral copies this assay can detect is 138 copies/mL. A negative result does not preclude SARS-Cov-2 infection and should not be used as the sole basis for treatment or other patient management decisions. A negative  result may occur with  improper specimen collection/handling, submission of specimen other than nasopharyngeal swab, presence of viral mutation(s) within the areas targeted by this assay, and inadequate number of viral copies(<138 copies/mL). A negative result must be combined with clinical observations, patient history, and epidemiological information. The expected result is Negative.  Fact Sheet for Patients:  EntrepreneurPulse.com.au  Fact Sheet for Healthcare Providers:  IncredibleEmployment.be  This test is no t yet approved or cleared by the [place] FDA and  has been authorized for detection and/or diagnosis of SARS-CoV-2 by FDA under an Emergency Use Authorization (EUA). This EUA will remain  in effect (meaning this test can be used) for the duration of the COVID-19 declaration under Section 564(b)(1) of the Act, 21 U.S.C.section 360bbb-3(b)(1), unless the authorization is terminated  or revoked sooner.       Influenza A by PCR NEGATIVE NEGATIVE Final   Influenza B by PCR NEGATIVE NEGATIVE Final    Comment: (NOTE) The Xpert Xpress SARS-CoV-2/FLU/RSV plus assay is intended as an aid in the diagnosis of influenza from Nasopharyngeal swab specimens and should not be used as a sole basis for treatment. Nasal washings and aspirates are unacceptable for Xpert Xpress SARS-CoV-2/FLU/RSV testing.  Fact Sheet for Patients: EntrepreneurPulse.com.au  Fact Sheet for Healthcare Providers: IncredibleEmployment.be  This test is not yet approved or cleared by the [place] FDA and has been authorized for detection and/or diagnosis of SARS-CoV-2 by FDA under an Emergency Use Authorization (EUA). This EUA will remain in effect (meaning this test can be used) for the duration of the COVID-19 declaration under Section 564(b)(1) of the Act, 21 U.S.C. section 360bbb-3(b)(1), unless the authorization is  terminated or revoked.  Performed at [facility], [place] [address], [place], [place] [address]      Radiological Exams on Admission: DG Chest 2 View  Result Date: 03/20/2020 CLINICAL DATA:  Short of breath, asthma, COPD EXAM: CHEST - 2 VIEW COMPARISON:  03/05/2020 FINDINGS: Frontal and lateral views of the chest demonstrates stable enlargement of the cardiac silhouette. Chronic ectasia of the thoracic aorta. Chronic areas of scarring are seen throughout the lungs. There is central vascular congestion,  with increased interstitial prominence compatible with interstitial edema. No acute airspace disease, effusion, or pneumothorax. No acute bony abnormalities. IMPRESSION: 1. Worsening volume status, with central vascular congestion and interstitial edema. Electronically Signed   By: Randa Ngo M.D.   On: 03/20/2020 22:35    EKG: Sinus tach at 109 bpm with normal axis and nonspecific ST-T wave changes.   Assessment/Plan  This is a 70 y.o. female with a history of anxiety, asthma, COPD on 2 L O2 at home, chronic diastolic CHF, chronic back pain now being admitted with:  Acute on chronic respiratory failure secondary to:  #. Acute exacerbation of congestive heart failure with elevated trop likely demand - Telemetry monitoring. - IV Lasix daily - Aspirin 325mg  PO then 81mg  daily - Holding beta blocker, ACE, nitrate 2/2 hypotension - Intake/output, daily weight. - Trend troponins, check lipids and TSH. -Check Echo -Consult cardiology  #. Acute exacerbation of COPD - IV steroids and azithromycin - Nebulizers, O2 therapy and expectorants as needed.  - Continuous pulse oximetry -Continue Pulmicort, Claritin -Continue BiPAP -Consult pulmonary/critical care  #.  History of hyperlipidemia -Continue Lipitor  #. History of GERD - Continue Protonix  #. History of bipolar disorder - Continue Risperdal, Effexor, Lamictal   Admission status: Inpatient, stepdown IV  Fluids: Hep-Lock Diet/Nutrition: Heart healthy Consults called: Critical care DVT Px: Eliquis and early ambulation. Code Status: Full Code  Disposition Plan: To home in 2-3 days  All the records are reviewed and case discussed with ED provider. Management plans discussed with the patient and/or family who express understanding and agree with plan of care.  Marteze Vecchio D.O. on 03/21/2020 at 3:01 AM CC: Primary care physician; Simona Huh, NP   03/21/2020, 3:01 AM

## 2020-03-21 NOTE — Progress Notes (Signed)
PROGRESS NOTE   Julia Daniels  GUR:[id number]    DOB: 04-22-1950    DOA: 03/20/2020  PCP: Simona Huh, NP   I have briefly reviewed patients previous medical records in [facility].  Chief Complaint  Patient presents with  . Shortness of Breath    Brief Narrative:  70 year old female, lives with her son, ambulates with the help of a walker/wheelchair, mostly sedentary, medical history significant for but not limited to anxiety and depression, asthma, COPD, chronic respiratory failure with hypoxia/hypercarbia on home oxygen 2-3 L/min via nasal cannula continuously, former smoker, chronic diastolic CHF, pulmonary embolism on Eliquis, chronic back pain and hyperlipidemia who presented to the ED via EMS with fairly subacute onset of dyspnea.  EMS administered epinephrine, albuterol, Atrovent, Solu-Medrol, magnesium and she was placed on CPAP.  Initially hypotensive in the ED but stabilized.  Admitted for acute on chronic respiratory failure with hypoxia due to COPD and CHF exacerbation.  Briefly required BiPAP but was weaned off same.  Her primary pulmonologist was consulted for assistance.   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  2D echo: LVEF 45-50% with regional wall motion abnormalities (mild hypokinesis of the mid to apical anteroseptum, inferoseptum and inferior myocardium).  Grade 1 diastolic dysfunction  Briefly required BiPAP on admission.  Now down to HFNC 6 L/min.  Titrate oxygen to saturations between 88-92%.  Continue BiPAP as needed.  Flutter valve and incentive spirometry.  Continue IV Lasix 40 mg daily.  Continue IV Solu-Medrol x1 day followed by p.o. prednisone.  Continue budesonide nebs,  duo nebs, Breo Ellipta and as needed albuterol nebs, course of azithromycin.  Pulmonology consultation appreciated.  Sees Dr. Melvyn Novas in the office and he saw her here.  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  Improving.  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  No anginal chest pain reported.  Troponin low in the 30s-50 range, flat trend.  Likely elevated due to demand ischemia from acute respiratory failure  We will consult cardiology in a.m. to determine need for any further inpatient work-up versus outpatient.  Resume beta-blockers when COPD exacerbation resolves.  Consider resuming ACEI in a.m.  Pulmonary embolism  Compliant with home dose of Eliquis, continue  Given response of her presentation to treatment for COPD/CHF, PE seems less likely.  Hyperlipidemia  Statins  GERD  Protonix  Anxiety and depression  Continue Risperdal, Effexor, Lamictal  Macrocytic anemia  Unclear etiology.  Relatively stable.  Follow CBCs.  Outpatient follow-up.   Body mass index is 40.85 kg/m./Morbid obesity    DVT prophylaxis:      Code Status: DNR Family Communication:  Disposition:  Status is: Inpatient  Remains inpatient appropriate because:Inpatient level of care appropriate due to severity of illness   Dispo: The patient is from: Home              Anticipated d/c is to: Home              Patient currently is not medically stable to d/c.   Difficult to place patient No      Consultants:   Pulmonology Cardiology-consulted to see in a.m.  Procedures:   Brief BiPAP in ED.  Antimicrobials:    Anti-infectives (From admission, onward)   Start     Dose/Rate Route Frequency Ordered Stop   03/22/20 0730  azithromycin (ZITHROMAX) tablet 500 mg       "Followed by" Linked Group Details   500 mg Oral Daily 03/21/20 0638 03/26/20 0959   03/21/20 0330  azithromycin (ZITHROMAX) 500 mg  in sodium chloride 0.9 % 250 mL IVPB       "Followed by" Linked Group Details   500 mg 250 mL/hr over 60 Minutes Intravenous Every 24 hours 03/21/20 0638 03/21/20 1149        Subjective:  Patient seen this morning.  Therapy team in the room.  Reports feeling much better with improvement in dyspnea but breathing has not returned to baseline.  An episode of nonbloody emesis this morning.  No abdominal pain  Objective:   Vitals:   03/21/20 0714 03/21/20 0926 03/21/20 1113 03/21/20 1139  BP:   (!) 148/88   Pulse:   97   Resp:   16   Temp:    98.7 F (37.1 C)  TempSrc:    Oral  SpO2: 95% 90% 96%   Weight:      Height:        General exam: Middle-age female, moderately built and morbidly obese lying propped up in bed without distress. Respiratory system: Diminished breath sounds bilaterally with scattered few medium pitched expiratory rhonchi but no crackles.  No increased work of breathing. Cardiovascular system: S1 & S2 heard, RRR. No JVD, murmurs, rubs, gallops or clicks.  Trace bilateral ankle edema.  Telemetry personally reviewed: Sinus tachycardia in the 100s. Gastrointestinal system: Abdomen is nondistended, soft and nontender. No organomegaly or masses felt. Normal bowel sounds heard. Central nervous system: Alert and oriented. No focal neurological deficits. Extremities: Symmetric 5 x 5 power. Skin: No rashes, lesions or ulcers Psychiatry: Judgement and insight appear normal. Mood & affect appropriate.     Data Reviewed:   I have personally reviewed following labs and imaging studies   CBC: Recent Labs  Lab 03/20/20 2234  WBC 12.7*  NEUTROABS 10.7*  HGB 10.8*  HCT 36.7  MCV 107.9*  PLT 258    Basic Metabolic Panel: Recent Labs  Lab 03/20/20 2150  NA 141  K 4.9  CL 99  CO2 32  GLUCOSE 130*  BUN 12  CREATININE 0.70  CALCIUM 9.9    Liver Function Tests: No results for input(s): AST, ALT, ALKPHOS, BILITOT, PROT, ALBUMIN in the last 168  hours.  CBG: No results for input(s): GLUCAP in the last 168 hours.  Microbiology Studies:   Recent Results (from the past 240 hour(s))  Resp Panel by RT-PCR (Flu A&B, Covid) Nasopharyngeal Swab     Status: None   Collection Time: 03/20/20  9:50 PM   Specimen: Nasopharyngeal Swab; Nasopharyngeal(NP) swabs in vial transport medium  Result Value Ref Range Status   SARS Coronavirus 2 by RT PCR NEGATIVE NEGATIVE Final    Comment: (NOTE) SARS-CoV-2 target nucleic acids are NOT DETECTED.  The SARS-CoV-2 RNA is generally detectable in upper respiratory specimens during the acute phase of infection. The lowest concentration of SARS-CoV-2 viral copies this assay can detect is 138 copies/mL. A negative result does not preclude SARS-Cov-2 infection and should not be used as the sole basis for treatment or other patient management decisions. A negative result may occur with  improper specimen collection/handling, submission of specimen other than nasopharyngeal swab, presence of viral mutation(s) within the areas targeted by  this assay, and inadequate number of viral copies(<138 copies/mL). A negative result must be combined with clinical observations, patient history, and epidemiological information. The expected result is Negative.  Fact Sheet for Patients:  EntrepreneurPulse.com.au  Fact Sheet for Healthcare Providers:  IncredibleEmployment.be  This test is no t yet approved or cleared by the [place] FDA and  has been authorized for detection and/or diagnosis of SARS-CoV-2 by FDA under an Emergency Use Authorization (EUA). This EUA will remain  in effect (meaning this test can be used) for the duration of the COVID-19 declaration under Section 564(b)(1) of the Act, 21 U.S.C.section 360bbb-3(b)(1), unless the authorization is terminated  or revoked sooner.       Influenza A by PCR NEGATIVE NEGATIVE Final   Influenza B by PCR NEGATIVE  NEGATIVE Final    Comment: (NOTE) The Xpert Xpress SARS-CoV-2/FLU/RSV plus assay is intended as an aid in the diagnosis of influenza from Nasopharyngeal swab specimens and should not be used as a sole basis for treatment. Nasal washings and aspirates are unacceptable for Xpert Xpress SARS-CoV-2/FLU/RSV testing.  Fact Sheet for Patients: EntrepreneurPulse.com.au  Fact Sheet for Healthcare Providers: IncredibleEmployment.be  This test is not yet approved or cleared by the [place] FDA and has been authorized for detection and/or diagnosis of SARS-CoV-2 by FDA under an Emergency Use Authorization (EUA). This EUA will remain in effect (meaning this test can be used) for the duration of the COVID-19 declaration under Section 564(b)(1) of the Act, 21 U.S.C. section 360bbb-3(b)(1), unless the authorization is terminated or revoked.  Performed at [facility], [place] [address], [place], [place] [address]      Radiology Studies:  DG Chest 2 View  Result Date: 03/20/2020 CLINICAL DATA:  Short of breath, asthma, COPD EXAM: CHEST - 2 VIEW COMPARISON:  03/05/2020 FINDINGS: Frontal and lateral views of the chest demonstrates stable enlargement of the cardiac silhouette. Chronic ectasia of the thoracic aorta. Chronic areas of scarring are seen throughout the lungs. There is central vascular congestion, with increased interstitial prominence compatible with interstitial edema. No acute airspace disease, effusion, or pneumothorax. No acute bony abnormalities. IMPRESSION: 1. Worsening volume status, with central vascular congestion and interstitial edema. Electronically Signed   By: Randa Ngo M.D.   On: 03/20/2020 22:35   ECHOCARDIOGRAM COMPLETE  Result Date: 03/21/2020    ECHOCARDIOGRAM REPORT   Patient Name:   Julia Daniels Date of Exam: 03/21/2020 Medical Rec #:  [id number]           Height:       63.0 in Accession #:    [id number]           Weight:       230.6 lb Date of Birth:  25-Jun-1950          BSA:          2.054 m Patient Age:    52 years            BP:           120/84 mmHg Patient Gender: F                   HR:           95 bpm. Exam Location:  Inpatient Procedure: 2D Echo, Cardiac Doppler, Color Doppler and Intracardiac            Opacification Agent Indications:    I50.40* Unspecified combined systolic (congestive) and diastolic                 (  congestive) heart failure  History:        Patient has prior history of Echocardiogram examinations, most                 recent 11/04/2019. CHF, COPD, Signs/Symptoms:Shortness of Breath                 and Dyspnea; Risk Factors:Current Smoker and Hypertension.                 Hypoxia.  Sonographer:    Roseanna Rainbow RDCS Referring Phys: [id number] ALEXIS HUGELMEYER  Sonographer Comments: Technically difficult study due to poor echo windows, suboptimal parasternal window, suboptimal apical window, suboptimal subcostal window and patient is morbidly obese. Image acquisition challenging due to patient body habitus. Patient in high fowler's position. IMPRESSIONS  1. Mild hypokinesis of the mid to apical anteroseptum, inferoseptum and inferior myocardium. Left ventricular ejection fraction, by estimation, is 45 to 50%. The left ventricle has mildly decreased function. The left ventricle demonstrates regional wall  motion abnormalities (see scoring diagram/findings for description). Left ventricular diastolic parameters are consistent with Grade I diastolic dysfunction (impaired relaxation). Elevated left ventricular end-diastolic pressure.  2. Right ventricular systolic function is normal. The right ventricular size is normal. There is normal pulmonary artery systolic pressure.  3. The mitral valve is normal in structure. No evidence of mitral valve regurgitation. No evidence of mitral stenosis.  4. The aortic valve is normal in structure. Aortic valve regurgitation is not visualized. No aortic  stenosis is present.  5. Aortic dilatation noted. There is mild dilatation of the ascending aorta, measuring 41 mm.  6. The inferior vena cava is dilated in size with >50% respiratory variability, suggesting right atrial pressure of 8 mmHg. FINDINGS  Left Ventricle: Mild hypokinesis of the mid to apical anteroseptum, inferoseptum and inferior myocardium. Left ventricular ejection fraction, by estimation, is 45 to 50%. The left ventricle has mildly decreased function. The left ventricle demonstrates regional wall motion abnormalities. Definity contrast agent was given IV to delineate the left ventricular endocardial borders. The left ventricular internal cavity size was normal in size. There is no left ventricular hypertrophy. Left ventricular diastolic parameters are consistent with Grade I diastolic dysfunction (impaired relaxation). Elevated left ventricular end-diastolic pressure. Right Ventricle: The right ventricular size is normal. No increase in right ventricular wall thickness. Right ventricular systolic function is normal. There is normal pulmonary artery systolic pressure. The tricuspid regurgitant velocity is 1.34 m/s, and  with an assumed right atrial pressure of 8 mmHg, the estimated right ventricular systolic pressure is 97.3 mmHg. Left Atrium: Left atrial size was normal in size. Right Atrium: Right atrial size was normal in size. Pericardium: There is no evidence of pericardial effusion. Mitral Valve: The mitral valve is normal in structure. No evidence of mitral valve regurgitation. No evidence of mitral valve stenosis. Tricuspid Valve: The tricuspid valve is normal in structure. Tricuspid valve regurgitation is trivial. No evidence of tricuspid stenosis. Aortic Valve: The aortic valve is normal in structure. Aortic valve regurgitation is not visualized. No aortic stenosis is present. Pulmonic Valve: The pulmonic valve was normal in structure. Pulmonic valve regurgitation is not visualized. No  evidence of pulmonic stenosis. Aorta: Aortic dilatation noted. There is mild dilatation of the ascending aorta, measuring 41 mm. Venous: The inferior vena cava is dilated in size with greater than 50% respiratory variability, suggesting right atrial pressure of 8 mmHg. IAS/Shunts: No atrial level shunt detected by color flow Doppler.  LEFT VENTRICLE PLAX 2D LVIDd:  4.30 cm     Diastology LVIDs:         3.40 cm     LV e' medial:    4.90 cm/s LV PW:         1.30 cm     LV E/e' medial:  14.2 LV IVS:        1.40 cm     LV e' lateral:   8.92 cm/s LVOT diam:     2.10 cm     LV E/e' lateral: 7.8 LV SV:         75 LV SV Index:   37 LVOT Area:     3.46 cm  LV Volumes (MOD) LV vol d, MOD A2C: 76.8 ml LV vol d, MOD A4C: 63.9 ml LV vol s, MOD A2C: 36.7 ml LV vol s, MOD A4C: 27.1 ml LV SV MOD A2C:     40.1 ml LV SV MOD A4C:     63.9 ml LV SV MOD BP:      38.5 ml RIGHT VENTRICLE             IVC RV S prime:     10.30 cm/s  IVC diam: 2.20 cm TAPSE (M-mode): 1.4 cm LEFT ATRIUM             Index       RIGHT ATRIUM           Index LA diam:        2.60 cm 1.27 cm/m  RA Area:     11.90 cm LA Vol (A2C):   25.5 ml 12.41 ml/m RA Volume:   29.50 ml  14.36 ml/m LA Vol (A4C):   17.7 ml 8.62 ml/m LA Biplane Vol: 22.6 ml 11.00 ml/m  AORTIC VALVE LVOT Vmax:   146.00 cm/s LVOT Vmean:  95.700 cm/s LVOT VTI:    0.217 m  AORTA Ao Root diam: 3.20 cm Ao Asc diam:  4.10 cm MITRAL VALVE                TRICUSPID VALVE MV Area (PHT): 3.17 cm     TR Peak grad:   7.2 mmHg MV Decel Time: 239 msec     TR Vmax:        134.00 cm/s MV E velocity: 69.80 cm/s MV A velocity: 111.00 cm/s  SHUNTS MV E/A ratio:  0.63         Systemic VTI:  0.22 m                             Systemic Diam: 2.10 cm Skeet Latch MD Electronically signed by Skeet Latch MD Signature Date/Time: 03/21/2020/1:29:30 PM    Final      Scheduled Meds:   . apixaban  5 mg Oral BID  . [START ON 03/22/2020] aspirin EC  81 mg Oral Daily  . [START ON 03/22/2020]  azithromycin  500 mg Oral Daily  . baclofen  10 mg Oral TID  . budesonide  0.5 mg Nebulization BID  . fluticasone furoate-vilanterol  1 puff Inhalation Daily  . furosemide  40 mg Intravenous Daily  . ipratropium-albuterol  3 mL Nebulization Q6H  . lamoTRIgine  200 mg Oral QHS  . loratadine  10 mg Oral QPM  . methylPREDNISolone (SOLU-MEDROL) injection  60 mg Intravenous Q6H   Followed by  . [START ON 03/22/2020] predniSONE  40 mg Oral Q breakfast  . pantoprazole  40 mg Oral Daily  .  potassium chloride  20 mEq Oral Daily  . risperiDONE  2 mg Oral QHS  . senna-docusate  1 tablet Oral BID  . venlafaxine XR  150 mg Oral QHS  . vitamin B-12  500 mcg Oral Daily    Continuous Infusions:     LOS: 0 days     Vernell Leep, MD, [place], [facility]. [facility]    To contact the attending provider between 7A-7P or the covering provider during after hours 7P-7A, please log into the web site [email or web address] and access using universal [place] password for that web site. If you do not have the password, please call the hospital operator.  03/21/2020, 1:35 PM

## 2020-03-21 NOTE — Progress Notes (Signed)
Patient transferred to 4W without complication.

## 2020-03-21 NOTE — Evaluation (Signed)
Physical Therapy Evaluation Patient Details Name: Julia Daniels MRN: [id number] DOB: Aug 16, 1950 Today's Date: 03/21/2020   History of Present Illness  Julia Daniels is a 70 y.o. female with a known history of anxiety, asthma, COPD on 2 L O2 at home, chronic diastolic CHF, chronic back pain presents to the emergency department for evaluation of shortness of breath. Admitted for acute on chronic respiratory failure 2/2 COPD and CHF exacerbation.  Clinical Impression  Julia Daniels is a 70 year old woman with frequent admissions who presents with dyspnea at rest on 6 liters. Patient lives at home with her son, who works, and reports her baseline since leaving rehab has been performing transfers to [facility] and wheelchair and performing ADLs at bedside and that she doesn't leave her room. Patient reports a decline in abilities since leaving rehab. Patient min assist to transfer to side of bed but reliant on bed rail to pull herself up, and min assist x 2 to stand and transfer to recliner. Patient's activity tolerance very poor requiring extending resting breaks and becoming fatigued with just talking. Patient transferred to recliner at her request. Patient required verbal cues for breathing technique. Patient will benefit from skilled PT services while in hospital to maximize IND and safety and work toward PLOF and improve breathing techniques to manage dyspnea    Follow Up Recommendations Home health PT    Equipment Recommendations  None recommended by PT    Recommendations for Other Services       Precautions / Restrictions Precautions Precautions: Fall Restrictions Weight Bearing Restrictions: No      Mobility  Bed Mobility Overal bed mobility: Needs Assistance Bed Mobility: Supine to Sit     Supine to sit: Min assist;HOB elevated;+2 for safety/equipment     General bed mobility comments: Increased time and need to rest 2* SOB once sitting    Transfers Overall  transfer level: Needs assistance Equipment used: 2 person hand held assist Transfers: Sit to/from [facility] Sit to Stand: Min assist;+2 physical assistance;+2 safety/equipment Stand pivot transfers: Min assist;+2 physical assistance       General transfer comment: Very unsteady to take steps to recliner. Dyspnea on 6 L and poor tolerance  Ambulation/Gait         Gait velocity: decr   General Gait Details: tolerated stand pvt to recliner only - ltd by SOB  Stairs            Wheelchair Mobility    Modified Rankin (Stroke Patients Only)       Balance Overall balance assessment: Needs assistance Sitting-balance support: No upper extremity supported Sitting balance-Leahy Scale: Fair       Standing balance-Leahy Scale: Poor Standing balance comment: reliant on external support                             Pertinent Vitals/Pain Pain Assessment: No/denies pain    Home Living Family/patient expects to be discharged to:: Private residence Living Arrangements: Children Available Help at Discharge: Family;Available PRN/intermittently Type of Home: House Home Access: Stairs to enter Entrance Stairs-Rails: Right Entrance Stairs-Number of Steps: 4 Home Layout: One level Home Equipment: Walker - 2 wheels;Bedside commode;Other (comment);Tub bench;Wheelchair - manual Additional Comments: sons lift her wc to get in/out of house for MD appts    Prior Function Level of Independence: Needs assistance   Gait / Transfers Assistance Needed: transfers only with RW  ADL's / Homemaking Assistance Needed: perfoms  sponge bath at eob, uses [facility] and has a grabber. Knows how to use sock aide but can't find it. Stays in room between bed, BSC and small card table  Comments: wears back brace as needed     Hand Dominance   Dominant Hand: Right    Extremity/Trunk Assessment   Upper Extremity Assessment Upper Extremity Assessment: Generalized weakness     Lower Extremity Assessment Lower Extremity Assessment: Generalized weakness    Cervical / Trunk Assessment Cervical / Trunk Assessment: Kyphotic  Communication   Communication: No difficulties  Cognition Arousal/Alertness: Awake/alert Behavior During Therapy: WFL for tasks assessed/performed Overall Cognitive Status: Within Functional Limits for tasks assessed                                        General Comments      Exercises     Assessment/Plan    PT Assessment Patient needs continued PT services  PT Problem List Decreased strength;Decreased activity tolerance;Decreased balance;Decreased mobility;Decreased knowledge of use of DME;Obesity       PT Treatment Interventions DME instruction;Gait training;Stair training;Functional mobility training;Therapeutic activities;Therapeutic exercise;Balance training;Patient/family education    PT Goals (Current goals can be found in the Care Plan section)  Acute Rehab PT Goals Patient Stated Goal: breathe better PT Goal Formulation: With patient Time For Goal Achievement: 04/04/20 Potential to Achieve Goals: Fair    Frequency Min 3X/week   Barriers to discharge Decreased caregiver support Pt lives with son who works    Co-evaluation PT/OT/SLP Co-Evaluation/Treatment: Yes Reason for Co-Treatment: For Doctor, hospital PT goals addressed during session: Mobility/safety with mobility OT goals addressed during session: ADL's and self-care       AM-PAC PT "6 Clicks" Mobility  Outcome Measure Help needed turning from your back to your side while in a flat bed without using bedrails?: A Little Help needed moving from lying on your back to sitting on the side of a flat bed without using bedrails?: A Lot Help needed moving to and from a bed to a chair (including a wheelchair)?: A Lot Help needed standing up from a chair using your arms (e.g., wheelchair or bedside chair)?: A Lot Help needed to walk in  hospital room?: A Lot Help needed climbing 3-5 steps with a railing? : Total 6 Click Score: 12    End of Session Equipment Utilized During Treatment: Oxygen Activity Tolerance: Patient limited by fatigue Patient left: in chair;with call bell/phone within reach Nurse Communication: Mobility status PT Visit Diagnosis: Unsteadiness on feet (R26.81);Muscle weakness (generalized) (M62.81);Difficulty in walking, not elsewhere classified (R26.2);Other (comment)    Time: 7680-8811 PT Time Calculation (min) (ACUTE ONLY): 32 min   Charges:   PT Evaluation $PT Eval Moderate Complexity: [place] Pager [phone] Office [phone]   Florencio Hollibaugh 03/21/2020, 1:20 PM

## 2020-03-21 NOTE — Evaluation (Signed)
Occupational Therapy Evaluation Patient Details Name: ANALIAH DRUM MRN: [id number] DOB: Jan 22, 1950 Today's Date: 03/21/2020    History of Present Illness Alvita Fana is a 70 y.o. female with a known history of anxiety, asthma, COPD on 2 L O2 at home, chronic diastolic CHF, chronic back pain presents to the emergency department for evaluation of shortness of breath. Admitted for acute on chronic respiratory failure 2/2 COPD and CHF exacerbation.   Clinical Impression   Mrs. Anderia Lorenzo is a 70 year old woman known to therapist from prior admissions who presents with dyspnea at rest on 6 liters. Patient lives at home with her son, who works, and reports her baseline since leaving rehab has been performing transfers to [facility] and wheelchair and performing ADLs at bedside and that she doesn't leave her room. Patient reports a decline in abilities since leaving rehab. Patient min assist to transfer to side of bed - reliant on bed rail to pull herself up, and min assist x 2 to stand and transfer to recliner. Patient's activity tolerance very poor requiring extending resting breaks and becoming fatigued with just talking. Patient transferred to recliner at her request. Patient required verbal cues for breathing technique. Patient will benefit from skilled OT services while in hospital to improve deficits, learn compensatory strategies as needed in order to return to PLOF and improve breathing techniques to manage dyspnea.    Follow Up Recommendations  Home health OT    Equipment Recommendations  None recommended by OT    Recommendations for Other Services       Precautions / Restrictions Precautions Precautions: Fall Restrictions Weight Bearing Restrictions: No      Mobility Bed Mobility Overal bed mobility: Needs Assistance Bed Mobility: Supine to Sit     Supine to sit: Min assist;HOB elevated;+2 for safety/equipment          Transfers Overall transfer level: Needs  assistance Equipment used: 2 person hand held assist Transfers: Sit to/from [facility] Sit to Stand: Min assist Stand pivot transfers: Min assist       General transfer comment: Very unsteady to take steps to recliner. Dyspnea on 6 L and poor tolerance    Balance Overall balance assessment: Needs assistance Sitting-balance support: No upper extremity supported Sitting balance-Leahy Scale: Fair       Standing balance-Leahy Scale: Poor Standing balance comment: reliant on external support                           ADL either performed or assessed with clinical judgement   ADL Overall ADL's : Needs assistance/impaired Eating/Feeding: Set up   Grooming: Set up   Upper Body Bathing: Moderate assistance;Set up   Lower Body Bathing: Maximal assistance;Sitting/lateral leans   Upper Body Dressing : Set up;Sitting   Lower Body Dressing: Total assistance;Sit to/from stand   Toilet Transfer: Minimal assistance;+2 for safety/equipment;+2 for physical assistance;Stand-pivot;BSC   Toileting- Clothing Manipulation and Hygiene: Maximal assistance;Sit to/from stand               Vision Patient Visual Report: No change from baseline       Perception     Praxis      Pertinent Vitals/Pain Pain Assessment: No/denies pain     Hand Dominance Right   Extremity/Trunk Assessment Upper Extremity Assessment Upper Extremity Assessment: Generalized weakness   Lower Extremity Assessment Lower Extremity Assessment: Defer to PT evaluation   Cervical / Trunk Assessment Cervical / Trunk Assessment:  Kyphotic   Communication Communication Communication: No difficulties   Cognition Arousal/Alertness: Awake/alert Behavior During Therapy: WFL for tasks assessed/performed Overall Cognitive Status: Within Functional Limits for tasks assessed                                     General Comments       Exercises     Shoulder  Instructions      Home Living Family/patient expects to be discharged to:: Private residence Living Arrangements: Children Available Help at Discharge: Family;Available PRN/intermittently Type of Home: House Home Access: Stairs to enter [employer] of Steps: 4 Entrance Stairs-Rails: Right Home Layout: One level     Bathroom Shower/Tub: Corporate investment banker: Standard Bathroom Accessibility: Yes How Accessible: Accessible via walker Home Equipment: [place] - 2 wheels;Bedside commode;Other (comment);Tub bench;Wheelchair - manual   Additional Comments: sons lift her wc to get in/out of house for MD appts      Prior Functioning/Environment Level of Independence: Needs assistance  Gait / Transfers Assistance Needed: transfers only with RW ADL's / Homemaking Assistance Needed: persoms sponge bathe at eob, uses [facility] and has a grabber. Knows how to use sock aide but can't find it.   Comments: wears back brace as needed        OT Problem List: Decreased strength;Decreased activity tolerance;Impaired balance (sitting and/or standing);Cardiopulmonary status limiting activity;Obesity;Increased edema      OT Treatment/Interventions: Self-care/ADL training;Therapeutic exercise;DME and/or AE instruction;Therapeutic activities;Balance training;Patient/family education    OT Goals(Current goals can be found in the care plan section) Acute Rehab OT Goals Patient Stated Goal: breathe better OT Goal Formulation: With patient Time For Goal Achievement: 04/04/20 Potential to Achieve Goals: Fair  OT Frequency: Min 2X/week   Barriers to D/C:            Co-evaluation              AM-PAC OT "6 Clicks" Daily Activity     Outcome Measure Help from another person eating meals?: A Little Help from another person taking care of personal grooming?: A Little Help from another person toileting, which includes using toliet, bedpan, or urinal?: A Lot Help from  another person bathing (including washing, rinsing, drying)?: A Lot Help from another person to put on and taking off regular upper body clothing?: A Little Help from another person to put on and taking off regular lower body clothing?: Total 6 Click Score: 14   End of Session Nurse Communication: Mobility status  Activity Tolerance: Patient limited by fatigue;Other (comment) (shortness of breath) Patient left:    OT Visit Diagnosis: Unsteadiness on feet (R26.81);Muscle weakness (generalized) (M62.81)                Time: 5409-8119 OT Time Calculation (min): 35 min Charges:  OT General Charges $OT Visit: 1 Visit OT Evaluation $OT Eval Moderate Complexity: 1 Mod  Elody Kleinsasser, OTR/L [place]  Office [phone] Pager: [place] 03/21/2020, 10:16 AM

## 2020-03-21 NOTE — Progress Notes (Signed)
RN called around 8 am to notify me that they had to remove the bipap. The patient is having nausea and vomiting at this time.

## 2020-03-21 NOTE — Consult Note (Signed)
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    History of Present Illness:  70 y.o. female with a known history of anxiety, asthma, COPD on 2 L O2 at home, chronic diastolic CHF, chronic back pain presents to the emergency department for evaluation of shortness of breath.  Patient was in a usual state of health until about 1 hour prior to arrival when she described a sudden onset shortness of breath that is worse than usual.  She has been feeling generally unwell with no specific symptoms for the past week.  EMS administered epinephrine, albuterol, Atrovent, Solu-Medrol and magnesium and she was placed on CPAP.  In the emergency department she was initially hypotensive but has stabilized.  She is currently on BiPAP and states that she is feeling much better.   Patient denied LE edema, fevers/chills, weakness, dizziness, chest pain, N/V/C/D, abdominal pain, dysuria/frequency, changes in mental status.    Otherwise there has been no change in status. Patient has been taking medication as prescribed and there has been no recent change in medication or diet.  No recent antibiotics.  There has been no recent illness, hospitalizations, travel or sick contacts.    Medical admission has been requested for further management of acute on chronic  respiratory failure 2/2 COPD and CHF exacerbation.   Note that she actually has an ABCD action plan but again did not follow it and says ran out of Deltasone = prednisone but still has few expired pills - for a week prior to admit she actually did admit noting increasing sob walking across the room and increase need for saba hfa and neb and admitted to me today this is the same pattern with each flare in past  eg when gets down to 10 mg daily of prednisone deteriorates over next 5-7 days (plan D was for her to double the dose of Deltasone  until better then try again)    Past Medical History:  Anxiety Asthma COPD Depression.    Significant Hospital Events:     Consults:  PCCM  3/13   Procedures:  2d ech 3/13   Significant Diagnostic Tests:  Echo  3/13 >>>  Micro Data:  Resp viral panel 3/12 neg Sputum  >>>  Antimicrobials:  Zmax  3/13 >>>   Scheduled Meds: . apixaban  5 mg Oral BID  . [START ON 03/22/2020] aspirin EC  81 mg Oral Daily  . [START ON 03/22/2020] azithromycin  500 mg Oral Daily  . baclofen  10 mg Oral TID  . budesonide  0.5 mg Nebulization BID  . fluticasone furoate-vilanterol  1 puff Inhalation Daily  . furosemide  40 mg Intravenous Daily  . ipratropium-albuterol  3 mL Nebulization Q6H  . lamoTRIgine  200 mg Oral QHS  . loratadine  10  mg Oral QPM  . methylPREDNISolone (SOLU-MEDROL) injection  60 mg Intravenous Q6H   Followed by  . [START ON 03/22/2020] predniSONE  40 mg Oral Q breakfast  . pantoprazole  40 mg Oral Daily  . potassium chloride  20 mEq Oral Daily  . risperiDONE  2 mg Oral QHS  . senna-docusate  1 tablet Oral BID  . venlafaxine XR  150 mg Oral QHS  . vitamin B-12  500 mcg Oral Daily   Continuous Infusions: . azithromycin 500 mg (03/21/20 1049)   PRN Meds:.albuterol, ondansetron (ZOFRAN) IV, perflutren lipid microspheres (DEFINITY) IV suspension     Interim History / Subjective:  "better as long as I have my nebulizer treatments"    Objective   Blood pressure 120/84, pulse 99, temperature 99.2 F (37.3 C), temperature source Axillary, resp. rate 20, height 5\' 3"  (1.6 m), weight 104.6 kg, SpO2 90 %.       No intake or output data in the 24 hours ending 03/21/20 1015 Filed Weights   03/20/20 2207 03/21/20 0639  Weight: 90.3 kg 104.6 kg    Examination: Tmax 99.2 On 5lpm NP sats 90%  General: chronically ill cushingnoid obese wf, mild increased wob HENT: clear  Lungs: end exp wheeze/ grunt better with plm Cardiovascular: RRR 2/6 sem Abdomen: obese/ soft/ poor excursion Extremities: warm s cyanosis/ clubbing or edema Neuro: intact    I personally reviewed images and agree with radiology impression as follows:  CXR:   PA and lat  3/12 Worsening volume status, with central vascular congestion and interstitial edema My review: agree with small lung volumes noted     Resolved Hospital Problem list      Assessment & Plan:   1) GOLD III copd with AB component 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  For now therefore I recommend the patient maintain  10 mg floor but immediately double when notes increase need for saba in any form  >>> good candidate for neb bud/formoterol/yupelri when gets new insurance, but for now ok to try trelegy until gets back to office > will need prednisone 10 mg # 60 on d/c  2) Acute on chronic hypoxemic and hypercarbic resp failure secondary to #1  >>> titrate 02 to low 90's and bipap prn approp for now but latter  not needed if we can get her up in chair         Labs   CBC: Recent Labs  Lab 03/20/20 2234  WBC 12.7*  NEUTROABS 10.7*  HGB 10.8*  HCT 36.7  MCV 107.9*  PLT 517    Basic Metabolic Panel: Recent Labs  Lab 03/20/20 2150  NA 141  K 4.9  CL 99  CO2 32  GLUCOSE 130*  BUN 12  CREATININE 0.70  CALCIUM 9.9   GFR: Estimated Creatinine Clearance: 79 mL/min (by C-G formula based on SCr of 0.7 mg/dL). Recent Labs  Lab 03/20/20 2234  WBC 12.7*    Liver Function Tests: No results for  input(s): AST, ALT, ALKPHOS, BILITOT, PROT, ALBUMIN in the last 168 hours. No results for input(s): LIPASE, AMYLASE in the last 168 hours. No results for input(s): AMMONIA in the last 168 hours.  ABG    Component Value Date/Time   PHART 7.385 10/21/2019 1945   PCO2ART 56.2 (H) 10/21/2019 1945   PO2ART 79.4 (L) 10/21/2019 1945   HCO3 35.4 (H) 12/04/2019 2305   TCO2 42 (H) 09/28/2019 1158   ACIDBASEDEF 0.6 12/31/2017 1642   O2SAT 70.8 12/04/2019 2305     Coagulation Profile: No results for input(s): INR, PROTIME in the last 168 hours.  Cardiac Enzymes: No results for input(s): CKTOTAL, CKMB, CKMBINDEX, TROPONINI in the last 168 hours.  HbA1C: Hgb A1c MFr Bld  Date/Time Value Ref Range Status  08/28/2019 05:06 AM 5.7 (H) 4.8 - 5.6 % Final    Comment:    (NOTE) Pre diabetes:          5.7%-6.4%  Diabetes:              >6.4%  Glycemic control for   <7.0% adults with diabetes   12/31/2017 01:17 PM 5.1 4.8 - 5.6 % Final    Comment:    (NOTE) Pre diabetes:          5.7%-6.4% Diabetes:              >6.4% Glycemic control for   <7.0% adults with diabetes     CBG: No results for input(s): GLUCAP in the last 168 hours.     Past Medical History:  She,  has a past medical history of Anxiety, Asthma, COPD (chronic obstructive pulmonary disease) ([place]), and Depression.   Surgical History:   Past Surgical History:  Procedure Laterality Date  . ABDOMINAL HYSTERECTOMY      partial      Social History:   reports that she quit smoking about 2 years ago. Her smoking use included cigarettes. She started smoking about 49 years ago. She has a 46.00 pack-year smoking history. She has never used smokeless tobacco. She reports that she does not drink alcohol and does not use drugs.   Family History:  Her family history includes Asthma in her mother; COPD in her father; Lung cancer in her father.   Allergies Allergies  Allergen Reactions  . Sulfa Antibiotics Other (See Comments)    Mouth gets raw     Home Medications  Prior to Admission medications  - - NOTE:   Unable to verify as accurately reflecting what pt takes     Medication Sig Start Date End Date Taking? Authorizing Provider  albuterol (PROAIR HFA) 108 (90 Base) MCG/ACT inhaler Inhale 2 puffs into the lungs every 6 (six) hours as needed for wheezing or shortness of breath. 07/25/19  Yes Tanda Rockers, MD  albuterol (PROVENTIL) (2.5 MG/3ML) 0.083% nebulizer solution Take 3 mLs (2.5 mg total) by nebulization every 6 (six) hours as needed for wheezing or shortness of breath. 09/10/19  Yes Tanda Rockers, MD  ALPRAZolam Duanne Moron) 0.25 MG tablet Take 0.25 mg by mouth 2 (two) times daily as needed for anxiety. 03/03/20  Yes [provider]  apixaban (ELIQUIS) 5 MG TABS tablet Take 1 tablet (5 mg total) by mouth 2 (two) times daily. 12/12/19  Yes Antonieta Pert, MD  atorvastatin (LIPITOR) 40 MG tablet Take 1 tablet (40 mg total) by mouth daily. 09/12/19 12/04/19 Yes Florencia Reasons, MD  baclofen (LIORESAL) 10 MG tablet Take 10 mg by mouth 3 (three) times daily.  09/05/19  Yes [provider]  Baclofen 5 MG TABS Take 2.5-5 mg by mouth 2 (two) times daily as needed for pain. 03/01/20  Yes [provider]  budesonide (PULMICORT) 0.5 MG/2ML nebulizer solution Take 2 mLs (0.5 mg total) by nebulization 2 (two) times daily. 11/20/19  Yes Tanda Rockers, MD  Cholecalciferol (VITAMIN D3) 1.25 MG (50000 UT) CAPS Take  1 capsule by mouth once a week. 02/10/20  Yes [provider]  famotidine (PEPCID) 20 MG tablet One at bedtime Patient taking differently: Take 20 mg by mouth at bedtime. 02/23/20  Yes Tanda Rockers, MD  FASENRA 30 MG/ML SOSY INJECT 1 SYRINGE UNDER THE SKIN EVERY 8 WEEKS. Patient taking differently: Inject into the skin every 8 (eight) weeks. 02/10/20  Yes Tanda Rockers, MD  furosemide (LASIX) 40 MG tablet 1 tablet daily (40mg  total); May take additional tablet (40mg ) in the afternoon prn leg swelling/weight gain Patient taking differently: Take 40 mg by mouth daily as needed for fluid. 03/15/20  Yes Martyn Ehrich, NP  guaiFENesin (MUCINEX) 600 MG 12 hr tablet Take 1 tablet (600 mg total) by mouth 2 (two) times daily. Patient taking differently: Take 600 mg by mouth 2 (two) times daily as needed for cough. 09/12/19  Yes Florencia Reasons, MD  HYDROcodone-acetaminophen (NORCO/VICODIN) 5-325 MG tablet Take 1 tablet by mouth every 6 (six) hours as needed for up to 4 doses for severe pain. 12/12/19  Yes Antonieta Pert, MD  ibuprofen (ADVIL) 200 MG tablet Take 600 mg by mouth every 6 (six) hours as needed for fever, headache or mild pain.   Yes [provider]  lamoTRIgine (LAMICTAL) 200 MG tablet Take 200 mg by mouth at bedtime.    Yes [provider]  lidocaine (LIDODERM) 5 % Place 1 patch onto the skin daily. Remove & Discard patch within 12 hours or as directed by MD 09/12/19  Yes Florencia Reasons, MD  loratadine (CLARITIN) 10 MG tablet Take 10 mg by mouth every evening.    Yes [provider]  pantoprazole (PROTONIX) 40 MG tablet Take 1 tablet (40 mg total) by mouth daily. Take 30-60 min before first meal of the day 07/25/19  Yes Tanda Rockers, MD  QUEtiapine (SEROQUEL) 50 MG tablet Take 150 mg by mouth at bedtime. 03/04/20  Yes [provider]  risperiDONE (RISPERDAL) 1 MG tablet Take 2 mg by mouth at bedtime.   Yes [provider]  Tiotropium Bromide-Olodaterol  (STIOLTO RESPIMAT) 2.5-2.5 MCG/ACT AERS Inhale 2 puffs into the lungs daily. 11/20/19  Yes Tanda Rockers, MD  venlafaxine XR (EFFEXOR-XR) 150 MG 24 hr capsule Take 150 mg by mouth at bedtime.    Yes [provider]  vitamin B-12 (CYANOCOBALAMIN) 500 MCG tablet Take 1 tablet (500 mcg total) by mouth daily. 11/11/19  Yes Dana Allan I, MD  barrier cream (NON-SPECIFIED) CREA Apply 1 application topically daily as needed (apply to rash). 11/10/19   Bonnell Public, MD  [employer] [facility]) OINT Apply 1 application topically daily as needed (rash).    [provider]  montelukast (SINGULAIR) 10 MG tablet Take 1 tablet (10 mg total) by mouth at bedtime. Patient not taking: No sig reported 02/23/20   Tanda Rockers, MD  potassium chloride (KLOR-CON) 10 MEQ tablet Take 10 mEq by mouth 2 (two) times daily. 03/01/20   [provider]  Potassium Chloride ER 20 MEQ TBCR TAKE 2 TABLETS BY MOUTH DAILY FOR 3 DAYS(  WHILE ON HIGHER DOSE LASIX) 03/10/20   Martyn Ehrich, NP  predniSONE (DELTASONE) 10 MG tablet Take 1 tablet (10 mg total) by mouth daily with breakfast. Patient not taking: No sig reported 12/13/19   Antonieta Pert, MD  predniSONE (DELTASONE) 10 MG tablet Take 4 tabs po daily x 3 days; then 3 tabs daily x3 days; then resume regular dose Patient not taking: No sig reported 03/05/20   Martyn Ehrich, NP  QUEtiapine (SEROQUEL) 100 MG tablet Take 1.5 tablets (150 mg total) by mouth at bedtime. Patient not taking: No sig reported 11/10/19   Dana Allan I, MD  risperiDONE (RISPERDAL) 3 MG tablet Take 1 tablet (3 mg total) by mouth at bedtime. Patient not taking: No sig reported 11/10/19   Dana Allan I, MD  senna-docusate (SENOKOT-S) 8.6-50 MG tablet Take 1 tablet by mouth 2 (two) times daily. 09/12/19   Florencia Reasons, MD       Christinia Gully, MD Pulmonary and [place] [phone]   After 7:00 pm call [place]   [phone]

## 2020-03-22 ENCOUNTER — Ambulatory Visit: Payer: BC Managed Care – PPO | Admitting: Internal Medicine

## 2020-03-22 DIAGNOSIS — R778 Other specified abnormalities of plasma proteins: Secondary | ICD-10-CM

## 2020-03-22 DIAGNOSIS — I5043 Acute on chronic combined systolic (congestive) and diastolic (congestive) heart failure: Secondary | ICD-10-CM

## 2020-03-22 DIAGNOSIS — Z86711 Personal history of pulmonary embolism: Secondary | ICD-10-CM

## 2020-03-22 DIAGNOSIS — I10 Essential (primary) hypertension: Secondary | ICD-10-CM

## 2020-03-22 LAB — BASIC METABOLIC PANEL
Anion gap: 8 (ref 5–15)
BUN: 15 mg/dL (ref 8–23)
CO2: 35 mmol/L — ABNORMAL HIGH (ref 22–32)
Calcium: 10.5 mg/dL — ABNORMAL HIGH (ref 8.9–10.3)
Chloride: 95 mmol/L — ABNORMAL LOW (ref 98–111)
Creatinine, Ser: 0.53 mg/dL (ref 0.44–1.00)
GFR, Estimated: >60 mL/min (ref >60)
Glucose, Bld: 143 mg/dL — ABNORMAL HIGH (ref 70–99)
Potassium: 4.6 mmol/L (ref 3.5–5.1)
Sodium: 138 mmol/L (ref 135–145)

## 2020-03-22 LAB — CBC
HCT: 36.3 % (ref 36.0–46.0)
Hemoglobin: 11 g/dL — ABNORMAL LOW (ref 12.0–15.0)
MCH: 32 pg (ref 26.0–34.0)
MCHC: 30.3 g/dL (ref 30.0–36.0)
MCV: 105.5 fL — ABNORMAL HIGH (ref 80.0–100.0)
Platelets: 282 10*3/uL (ref 150–400)
RBC: 3.44 MIL/uL — ABNORMAL LOW (ref 3.87–5.11)
RDW: 12.8 % (ref 11.5–15.5)
WBC: 12.8 10*3/uL — ABNORMAL HIGH (ref 4.0–10.5)
nRBC: 0 % (ref 0.0–0.2)

## 2020-03-22 LAB — HEMOGLOBIN A1C
Hgb A1c MFr Bld: 5.6 % (ref 4.8–5.6)
Mean Plasma Glucose: 114.02 mg/dL

## 2020-03-22 LAB — APTT: aPTT: 28 seconds (ref 24–36)

## 2020-03-22 LAB — HEPARIN LEVEL (UNFRACTIONATED): Heparin Unfractionated: >2.2 IU/mL — ABNORMAL HIGH (ref 0.30–0.70)

## 2020-03-22 MED ORDER — FUROSEMIDE 10 MG/ML IJ SOLN
40.0000 mg | Freq: Two times a day (BID) | INTRAMUSCULAR | Status: AC
  Administered 2020-03-22 – 2020-03-23 (×3): 40 mg via INTRAVENOUS
  Filled 2020-03-22 (×3): qty 4

## 2020-03-22 MED ORDER — QUETIAPINE FUMARATE 50 MG PO TABS
150.0000 mg | ORAL_TABLET | Freq: Every day | ORAL | Status: DC
  Administered 2020-03-22 – 2020-03-31 (×10): 150 mg via ORAL
  Filled 2020-03-22 (×2): qty 3
  Filled 2020-03-22: qty 1
  Filled 2020-03-22: qty 3
  Filled 2020-03-22: qty 1
  Filled 2020-03-22 (×5): qty 3

## 2020-03-22 MED ORDER — HEPARIN (PORCINE) 25000 UT/250ML-% IV SOLN
1300.0000 [IU]/h | INTRAVENOUS | Status: DC
  Administered 2020-03-22: 1300 [IU]/h via INTRAVENOUS
  Filled 2020-03-22: qty 250

## 2020-03-22 MED ORDER — ATORVASTATIN CALCIUM 80 MG PO TABS
80.0000 mg | ORAL_TABLET | Freq: Every day | ORAL | Status: DC
  Administered 2020-03-22 – 2020-04-01 (×10): 80 mg via ORAL
  Filled 2020-03-22 (×6): qty 1
  Filled 2020-03-22: qty 2
  Filled 2020-03-22 (×2): qty 1
  Filled 2020-03-22: qty 2

## 2020-03-22 NOTE — Progress Notes (Addendum)
PROGRESS NOTE   Julia Daniels  GBT:[id number]    DOB: April 22, 1950    DOA: 03/20/2020  PCP: Simona Huh, NP   I have briefly reviewed patients previous medical records in [facility].  Chief Complaint  Patient presents with  . Shortness of Breath    Brief Narrative:  70 year old female, lives with her son, ambulates with the help of a walker/wheelchair, mostly sedentary, medical history significant for but not limited to anxiety and depression, asthma, COPD, chronic respiratory failure with hypoxia/hypercarbia on home oxygen 2-3 L/min via nasal cannula continuously, former smoker, chronic diastolic CHF, pulmonary embolism on Eliquis, chronic back pain and hyperlipidemia who presented to the ED via EMS with fairly subacute onset of dyspnea.  EMS administered epinephrine, albuterol, Atrovent, Solu-Medrol, magnesium and she was placed on CPAP.  Initially hypotensive in the ED but stabilized.  Admitted for acute on chronic respiratory failure with hypoxia due to COPD and CHF exacerbation.  Briefly required BiPAP but was weaned off same.  Seen by pulmonology.  Cardiology consulted for new cardiomyopathy/CHF management assistance.  Was not comfortable with BiPAP last night,?  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  2D echo: LVEF 45-50% with regional wall motion abnormalities (mild hypokinesis of the mid to apical anteroseptum, inferoseptum and inferior myocardium).  Grade 1 diastolic dysfunction  Briefly required BiPAP on admission.  Now down to 5 HFNC  L/min.  Titrate oxygen to saturations between 88-92%, discussed with RN.  Continue BiPAP as needed.  Patient reports that she was on  BiPAP overnight for several hours and was not comfortable with it.  Pulmonology to advise if she needs to be on BiPAP overnight.  Flutter valve and incentive spirometry.  Continue IV Lasix 40 mg daily.  S/p IV Solu-Medrol x1 day followed by p.o. prednisone.  After burst of steroids, she needs to be back on her home dose of prednisone?  10 mg daily.  Good urine output/1900 mL documented yesterday but probably not all of it.  -1.4 L thus far.  Continue budesonide nebs, duo nebs, Breo Ellipta and as needed albuterol nebs, course of azithromycin.  Pulmonology consultation appreciated.  Sees Dr. Melvyn Novas in the office and he saw her here.  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  Continues to improve.  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  No anginal chest pain reported.  Troponin low in the 30s-50 range, flat trend.  Likely elevated due to demand ischemia from acute respiratory failure  Consulted cardiology to determine need for any further inpatient work-up versus outpatient.  Resume beta-blockers when COPD exacerbation resolves.  Consider resuming ACEI in a.m.  Await cardiology follow-up regarding continuing IV Lasix for additional day versus switching to oral.  Difficult to determine volume status due to body habitus.  Pulmonary embolism  Compliant with home dose of Eliquis, continue  Given response of her presentation to treatment for COPD/CHF, PE seems less likely.  Hyperlipidemia  Statins  GERD  Protonix  Anxiety and depression  Continue Risperdal, Effexor, Lamictal  Macrocytic anemia  Unclear etiology.  Stable.  Outpatient follow-up.   Body mass index is 39.99 kg/m./Morbid obesity  DVT prophylaxis:      Code Status: DNR Family Communication: Discussed in detail with patient's son, updated care and answered all questions.  He was appreciative of the call. Disposition:   Status is: Inpatient  Remains inpatient appropriate because:Inpatient level of care appropriate due to severity of illness   Dispo: The patient is from: Home              Anticipated d/c is to: Home              Patient currently is not medically stable to d/c.   Difficult to place patient No      Consultants:   Pulmonology Cardiology-consulted to see in a.m.  Procedures:   Brief BiPAP in ED and overnight.  Antimicrobials:    Anti-infectives (From admission, onward)   Start     Dose/Rate Route Frequency Ordered Stop   03/22/20 0730  azithromycin (ZITHROMAX) tablet 500 mg       "Followed by" Linked Group Details   500 mg Oral Daily 03/21/20 0638 03/26/20 0959   03/21/20 0330  azithromycin (ZITHROMAX) 500 mg in sodium chloride 0.9 % 250 mL IVPB       "Followed by" Linked Group Details   500 mg 250 mL/hr over 60 Minutes Intravenous Every 24 hours 03/21/20 0638 03/21/20 1149        Subjective:  Feels much better.  Dyspnea significantly improved and may be almost at baseline.  No chest pain.  Objective:   Vitals:   03/21/20 1952 03/21/20 2016 03/22/20 0543 03/22/20 0738  BP:  (!) 151/85 102/68   Pulse:  99 93   Resp:  18 16   Temp:  98.5 F (36.9 C) 99.1 F (37.3 C)   TempSrc:  Oral Oral   SpO2: 97% 94% 96% 96%  Weight:    102.4 kg  Height:        General exam: Middle-age female, moderately built and morbidly obese lying propped up in bed without distress.  Looks improved compared to yesterday.  Appears to be in good spirits. Respiratory system: Occasional basal crackles but otherwise clear to auscultation without wheezing or rhonchi.  No increased work of breathing. Cardiovascular system: S1 and S2 heard, RRR.  Difficult to appreciate JVD.  No murmurs.  Trace bilateral ankle edema.  Telemetry personally reviewed: Sinus rhythm. Gastrointestinal system: Abdomen is nondistended, soft and nontender. No organomegaly or masses felt. Normal bowel sounds heard. Central  nervous system: Alert and oriented. No focal neurological deficits. Extremities: Symmetric 5 x 5 power. Skin: No rashes, lesions or ulcers Psychiatry: Judgement and insight appear normal. Mood & affect appropriate.     Data Reviewed:   I have personally reviewed following labs and imaging studies   CBC: Recent Labs  Lab 03/20/20 2234 03/22/20 0355  WBC 12.7* 12.8*  NEUTROABS 10.7*  --   HGB 10.8* 11.0*  HCT 36.7 36.3  MCV 107.9* 105.5*  PLT 266 010    Basic Metabolic Panel: Recent Labs  Lab 03/20/20 2150 03/22/20 0355  NA 141 138  K 4.9 4.6  CL 99 95*  CO2 32 35*  GLUCOSE 130* 143*  BUN 12 15  CREATININE 0.70 0.53  CALCIUM 9.9 10.5*    Liver Function Tests: No results for input(s): AST, ALT, ALKPHOS, BILITOT, PROT, ALBUMIN in the last 168 hours.  CBG: No results for input(s): GLUCAP in the last 168 hours.  Microbiology Studies:   Recent Results (from the past 240 hour(s))  Resp Panel by  RT-PCR (Flu A&B, Covid) Nasopharyngeal Swab     Status: None   Collection Time: 03/20/20  9:50 PM   Specimen: Nasopharyngeal Swab; Nasopharyngeal(NP) swabs in vial transport medium  Result Value Ref Range Status   SARS Coronavirus 2 by RT PCR NEGATIVE NEGATIVE Final    Comment: (NOTE) SARS-CoV-2 target nucleic acids are NOT DETECTED.  The SARS-CoV-2 RNA is generally detectable in upper respiratory specimens during the acute phase of infection. The lowest concentration of SARS-CoV-2 viral copies this assay can detect is 138 copies/mL. A negative result does not preclude SARS-Cov-2 infection and should not be used as the sole basis for treatment or other patient management decisions. A negative result may occur with  improper specimen collection/handling, submission of specimen other than nasopharyngeal swab, presence of viral mutation(s) within the areas targeted by this assay, and inadequate number of viral copies(<138 copies/mL). A negative result must be combined  with clinical observations, patient history, and epidemiological information. The expected result is Negative.  Fact Sheet for Patients:  EntrepreneurPulse.com.au  Fact Sheet for Healthcare Providers:  IncredibleEmployment.be  This test is no t yet approved or cleared by the [place] FDA and  has been authorized for detection and/or diagnosis of SARS-CoV-2 by FDA under an Emergency Use Authorization (EUA). This EUA will remain  in effect (meaning this test can be used) for the duration of the COVID-19 declaration under Section 564(b)(1) of the Act, 21 U.S.C.section 360bbb-3(b)(1), unless the authorization is terminated  or revoked sooner.       Influenza A by PCR NEGATIVE NEGATIVE Final   Influenza B by PCR NEGATIVE NEGATIVE Final    Comment: (NOTE) The Xpert Xpress SARS-CoV-2/FLU/RSV plus assay is intended as an aid in the diagnosis of influenza from Nasopharyngeal swab specimens and should not be used as a sole basis for treatment. Nasal washings and aspirates are unacceptable for Xpert Xpress SARS-CoV-2/FLU/RSV testing.  Fact Sheet for Patients: EntrepreneurPulse.com.au  Fact Sheet for Healthcare Providers: IncredibleEmployment.be  This test is not yet approved or cleared by the [place] FDA and has been authorized for detection and/or diagnosis of SARS-CoV-2 by FDA under an Emergency Use Authorization (EUA). This EUA will remain in effect (meaning this test can be used) for the duration of the COVID-19 declaration under Section 564(b)(1) of the Act, 21 U.S.C. section 360bbb-3(b)(1), unless the authorization is terminated or revoked.  Performed at [facility], [place] [address] [place], [place] [address]      Radiology Studies:  DG Chest 2 View  Result Date: 03/20/2020 CLINICAL DATA:  Short of breath, asthma, COPD EXAM: CHEST - 2 VIEW COMPARISON:  03/05/2020  FINDINGS: Frontal and lateral views of the chest demonstrates stable enlargement of the cardiac silhouette. Chronic ectasia of the thoracic aorta. Chronic areas of scarring are seen throughout the lungs. There is central vascular congestion, with increased interstitial prominence compatible with interstitial edema. No acute airspace disease, effusion, or pneumothorax. No acute bony abnormalities. IMPRESSION: 1. Worsening volume status, with central vascular congestion and interstitial edema. Electronically Signed   By: Randa Ngo M.D.   On: 03/20/2020 22:35   ECHOCARDIOGRAM COMPLETE  Result Date: 03/21/2020    ECHOCARDIOGRAM REPORT   Patient Name:   Julia Daniels Date of Exam: 03/21/2020 Medical Rec #:  [id number]           Height:       63.0 in Accession #:    [id number]          Weight:  230.6 lb Date of Birth:  07/14/1950          BSA:          2.054 m Patient Age:    29 years            BP:           120/84 mmHg Patient Gender: F                   HR:           95 bpm. Exam Location:  Inpatient Procedure: 2D Echo, Cardiac Doppler, Color Doppler and Intracardiac            Opacification Agent Indications:    I50.40* Unspecified combined systolic (congestive) and diastolic                 (congestive) heart failure  History:        Patient has prior history of Echocardiogram examinations, most                 recent 11/04/2019. CHF, COPD, Signs/Symptoms:Shortness of Breath                 and Dyspnea; Risk Factors:Current Smoker and Hypertension.                 Hypoxia.  Sonographer:    Roseanna Rainbow RDCS Referring Phys: [id number] ALEXIS HUGELMEYER  Sonographer Comments: Technically difficult study due to poor echo windows, suboptimal parasternal window, suboptimal apical window, suboptimal subcostal window and patient is morbidly obese. Image acquisition challenging due to patient body habitus. Patient in high fowler's position. IMPRESSIONS  1. Mild hypokinesis of the mid to apical anteroseptum,  inferoseptum and inferior myocardium. Left ventricular ejection fraction, by estimation, is 45 to 50%. The left ventricle has mildly decreased function. The left ventricle demonstrates regional wall  motion abnormalities (see scoring diagram/findings for description). Left ventricular diastolic parameters are consistent with Grade I diastolic dysfunction (impaired relaxation). Elevated left ventricular end-diastolic pressure.  2. Right ventricular systolic function is normal. The right ventricular size is normal. There is normal pulmonary artery systolic pressure.  3. The mitral valve is normal in structure. No evidence of mitral valve regurgitation. No evidence of mitral stenosis.  4. The aortic valve is normal in structure. Aortic valve regurgitation is not visualized. No aortic stenosis is present.  5. Aortic dilatation noted. There is mild dilatation of the ascending aorta, measuring 41 mm.  6. The inferior vena cava is dilated in size with >50% respiratory variability, suggesting right atrial pressure of 8 mmHg. FINDINGS  Left Ventricle: Mild hypokinesis of the mid to apical anteroseptum, inferoseptum and inferior myocardium. Left ventricular ejection fraction, by estimation, is 45 to 50%. The left ventricle has mildly decreased function. The left ventricle demonstrates regional wall motion abnormalities. Definity contrast agent was given IV to delineate the left ventricular endocardial borders. The left ventricular internal cavity size was normal in size. There is no left ventricular hypertrophy. Left ventricular diastolic parameters are consistent with Grade I diastolic dysfunction (impaired relaxation). Elevated left ventricular end-diastolic pressure. Right Ventricle: The right ventricular size is normal. No increase in right ventricular wall thickness. Right ventricular systolic function is normal. There is normal pulmonary artery systolic pressure. The tricuspid regurgitant velocity is 1.34 m/s, and  with  an assumed right atrial pressure of 8 mmHg, the estimated right ventricular systolic pressure is 28.4 mmHg. Left Atrium: Left atrial size was normal in size. Right Atrium: Right atrial  size was normal in size. Pericardium: There is no evidence of pericardial effusion. Mitral Valve: The mitral valve is normal in structure. No evidence of mitral valve regurgitation. No evidence of mitral valve stenosis. Tricuspid Valve: The tricuspid valve is normal in structure. Tricuspid valve regurgitation is trivial. No evidence of tricuspid stenosis. Aortic Valve: The aortic valve is normal in structure. Aortic valve regurgitation is not visualized. No aortic stenosis is present. Pulmonic Valve: The pulmonic valve was normal in structure. Pulmonic valve regurgitation is not visualized. No evidence of pulmonic stenosis. Aorta: Aortic dilatation noted. There is mild dilatation of the ascending aorta, measuring 41 mm. Venous: The inferior vena cava is dilated in size with greater than 50% respiratory variability, suggesting right atrial pressure of 8 mmHg. IAS/Shunts: No atrial level shunt detected by color flow Doppler.  LEFT VENTRICLE PLAX 2D LVIDd:         4.30 cm     Diastology LVIDs:         3.40 cm     LV e' medial:    4.90 cm/s LV PW:         1.30 cm     LV E/e' medial:  14.2 LV IVS:        1.40 cm     LV e' lateral:   8.92 cm/s LVOT diam:     2.10 cm     LV E/e' lateral: 7.8 LV SV:         75 LV SV Index:   37 LVOT Area:     3.46 cm  LV Volumes (MOD) LV vol d, MOD A2C: 76.8 ml LV vol d, MOD A4C: 63.9 ml LV vol s, MOD A2C: 36.7 ml LV vol s, MOD A4C: 27.1 ml LV SV MOD A2C:     40.1 ml LV SV MOD A4C:     63.9 ml LV SV MOD BP:      38.5 ml RIGHT VENTRICLE             IVC RV S prime:     10.30 cm/s  IVC diam: 2.20 cm TAPSE (M-mode): 1.4 cm LEFT ATRIUM             Index       RIGHT ATRIUM           Index LA diam:        2.60 cm 1.27 cm/m  RA Area:     11.90 cm LA Vol (A2C):   25.5 ml 12.41 ml/m RA Volume:   29.50 ml  14.36  ml/m LA Vol (A4C):   17.7 ml 8.62 ml/m LA Biplane Vol: 22.6 ml 11.00 ml/m  AORTIC VALVE LVOT Vmax:   146.00 cm/s LVOT Vmean:  95.700 cm/s LVOT VTI:    0.217 m  AORTA Ao Root diam: 3.20 cm Ao Asc diam:  4.10 cm MITRAL VALVE                TRICUSPID VALVE MV Area (PHT): 3.17 cm     TR Peak grad:   7.2 mmHg MV Decel Time: 239 msec     TR Vmax:        134.00 cm/s MV E velocity: 69.80 cm/s MV A velocity: 111.00 cm/s  SHUNTS MV E/A ratio:  0.63         Systemic VTI:  0.22 m  Systemic Diam: 2.10 cm Skeet Latch MD Electronically signed by Skeet Latch MD Signature Date/Time: 03/21/2020/1:29:30 PM    Final      Scheduled Meds:   . apixaban  5 mg Oral BID  . aspirin EC  81 mg Oral Daily  . atorvastatin  80 mg Oral Daily  . azithromycin  500 mg Oral Daily  . baclofen  10 mg Oral TID  . budesonide  0.5 mg Nebulization BID  . chlorhexidine  15 mL Mouth Rinse BID  . famotidine  20 mg Oral QHS  . fluticasone furoate-vilanterol  1 puff Inhalation Daily  . furosemide  40 mg Intravenous Daily  . ipratropium-albuterol  3 mL Nebulization Q6H  . lamoTRIgine  200 mg Oral QHS  . loratadine  10 mg Oral QPM  . mouth rinse  15 mL Mouth Rinse q12n4p  . pantoprazole  40 mg Oral Daily  . potassium chloride  20 mEq Oral Daily  . predniSONE  40 mg Oral Q breakfast  . risperiDONE  2 mg Oral QHS  . senna-docusate  1 tablet Oral BID  . venlafaxine XR  150 mg Oral QHS  . vitamin B-12  500 mcg Oral Daily    Continuous Infusions:     LOS: 1 day     Vernell Leep, MD, [place], [facility]. [facility]    To contact the attending provider between 7A-7P or the covering provider during after hours 7P-7A, please log into the web site [email or web address] and access using universal [place] password for that web site. If you do not have the password, please call the hospital operator.  03/22/2020, 10:53 AM

## 2020-03-22 NOTE — Progress Notes (Signed)
Physical Therapy Treatment Patient Details Name: Julia Daniels MRN: [id number] DOB: 04-26-1950 Today's Date: 03/22/2020    History of Present Illness Julia Daniels is a 70 y.o. female with a known history of anxiety, asthma, COPD on 2 L O2 at home, chronic diastolic CHF, chronic back pain presents to the emergency department for evaluation of shortness of breath. Admitted for acute on chronic respiratory failure 2/2 COPD and CHF exacerbation.    PT Comments    Pt assisted OOB to recliner on 4L O2 [place] today.  Pt reports dyspnea feels at baseline with activity today.  Pt reports minimal ambulation at baseline (maybe 10 feet).  Pt agreeable to remain in recliner at least through lunch.  Continue to recommend HHPT upon d/c.   Follow Up Recommendations  Home health PT     Equipment Recommendations  None recommended by PT    Recommendations for Other Services       Precautions / Restrictions Precautions Precautions: Fall Precaution Comments: chronic O2    Mobility  Bed Mobility Overal bed mobility: Needs Assistance Bed Mobility: Supine to Sit     Supine to sit: HOB elevated;Supervision          Transfers Overall transfer level: Needs assistance Equipment used: Rolling walker (2 wheeled) Transfers: Sit to/from [facility] Sit to Stand: Min guard Stand pivot transfers: Min guard       General transfer comment: pt steady with RW, remained on 4L O2 [place], reports dyspnea however feels it is her baseline  Ambulation/Gait                 Stairs             Wheelchair Mobility    Modified Rankin (Stroke Patients Only)       Balance           Standing balance support: Bilateral upper extremity supported Standing balance-Leahy Scale: Poor Standing balance comment: reliant on external support                            Cognition Arousal/Alertness: Awake/alert Behavior During Therapy: WFL for tasks  assessed/performed Overall Cognitive Status: Within Functional Limits for tasks assessed                                        Exercises General Exercises - Lower Extremity Ankle Circles/Pumps: AROM;Both;10 reps    General Comments        Pertinent Vitals/Pain Pain Assessment: No/denies pain    Home Living                      Prior Function            PT Goals (current goals can now be found in the care plan section) Progress towards PT goals: Progressing toward goals    Frequency    Min 3X/week      PT Plan Current plan remains appropriate    Co-evaluation              AM-PAC PT "6 Clicks" Mobility   Outcome Measure  Help needed turning from your back to your side while in a flat bed without using bedrails?: A Little Help needed moving from lying on your back to sitting on the side of a flat bed without using bedrails?: A Little Help needed  moving to and from a bed to a chair (including a wheelchair)?: A Little Help needed standing up from a chair using your arms (e.g., wheelchair or bedside chair)?: A Little Help needed to walk in hospital room?: A Little Help needed climbing 3-5 steps with a railing? : A Lot 6 Click Score: 17    End of Session Equipment Utilized During Treatment: Oxygen Activity Tolerance: Patient limited by fatigue Patient left: in chair;with call bell/phone within reach;with chair alarm set Nurse Communication: Mobility status PT Visit Diagnosis: Muscle weakness (generalized) (M62.81);Difficulty in walking, not elsewhere classified (R26.2)     Time: 1110-1120 PT Time Calculation (min) (ACUTE ONLY): 10 min  Charges:  $Therapeutic Activity: 8-22 mins                     Jannette Spanner PT, DPT Acute Rehabilitation Services Pager: [phone] Office: [phone]  York Ram E 03/22/2020, 12:49 PM

## 2020-03-22 NOTE — Progress Notes (Signed)
Nutrition Brief Note  Patient identified for consult for assessment of nutrition requirements and status per COPD protocol.   Wt Readings from Last 15 Encounters:  03/22/20 102.4 kg  03/05/20 107.4 kg  12/12/19 90.8 kg  11/20/19 95.8 kg  11/10/19 91.8 kg  10/21/19 88 kg  10/07/19 90.6 kg  09/12/19 90.3 kg  09/08/19 89 kg  08/27/19 88.9 kg  07/25/19 87.7 kg  06/27/19 83.9 kg  12/27/18 87.5 kg  10/03/18 90.3 kg  07/03/18 93.4 kg    Body mass index is 39.99 kg/m. Patient meets criteria for obesity/borderline morbid obesity based on current BMI.   Weight today is 225 lb and weight on 2/25 was 236 lb indicating 11 lb weight loss (4.7% body weight) in the past 2.5 weeks. Of note, patient currently has mild pitting edema to BLE with order for IV lasix BID. She is mainly sedentary at baseline and does experience increase in swelling at times.   MST score of 0. Patient denied any changes in appetite PTA and does not feel that weight has changed other than potentially from fluid.   Current diet order is Heart Healthy, patient is consuming approximately 100% of meals at this time. Labs and medications reviewed.   No nutrition interventions warranted at this time. If nutrition issues arise, please consult RD.       Jarome Matin, MS, RD, LDN, CNSC Inpatient Clinical Dietitian RD pager # available in [place]  After hours/weekend pager # available in [facility]

## 2020-03-22 NOTE — Consult Note (Addendum)
Cardiology Consultation:   Patient ID: Julia Daniels MRN: [id number]; DOB: 1950-06-03  Admit date: 03/20/2020 Date of Consult: 03/22/2020  PCP:  Simona Huh, NP   [place]  Cardiologist:  No primary care provider on file.  Advanced Practice Provider:  No care team member to display Electrophysiologist:  None   Patient Profile:   Julia Daniels is a 70 y.o. female with a PMH of chronic diastolic CHF, HLD, COPD on home O2, anxiety, depression, GERD, and PE on Eliquis, who is being seen today for the evaluation of acute combined CHF at the request of Dr. Algis Liming.  History of Present Illness:   Julia Daniels presented to the ED 03/20/20 with complaints of sudden onset worsening SOB which started ~1 hour prior to ED arrival. On EMS arrival she was tachypneic with increased WOB, given epinephrine IM, albuterol and atrovent nebulizers, and steroids, and patient was placed on CPAP en route to the hospital. Unfortunately the CPAP machine broke in the ambulance. She was placed on BiPAP on arrival to the ED.   Vitals with one episode of hypotension, otherwise BP stable/intermittently elevated, initially mildly tachycardic, intermittently tachypneic, with O2 sat in the 90s on intermittent BiPAP, HFNC, or [place]. Labs notable for electrolytes wnl, Cr 0.7, WBC 12.7, Hgb 10.8, PLT 266, BNP 76, HsTrop 39>50>49>41. EKG with sinus tachycardia, rate 109 bpm, no STE/D, no significant change from previous. CXR showed central vascular congestion and interstitial edema. She was admitted to medicine for acute on chronic respiratory failure 2/2 acute CHF and COPD. She was seen by PCCM and recommended for ongoing supportive care, prednisone, and trelegy. She was started on lasix IV 40mg  daily for CHF component. Echocardiogram this admission showed EF 45-50%, mild hypokinesis of the apical anteroseptum, inferoseptum, and inferior myocardium, mild dilation of the ascending aorta 74mm, and  no significant valvular abnormalities (reduced EF and RWMA new compared to 10/2019).  Cardiology asked to evaluate.   She is on po lasix at home for chronic diastolic CHF, though otherwise has no know heart disease history. Does not appear to have had prior ischemic testing, though was noted to have coronary artery and aortic atherosclerosis on prior CTA Chest 10/2019. She does not follow with cardiology outpatient. She denies any chest pain or pressure, PND, orthopnea, LE edema, dizziness or syncope.   Past Medical History:  Diagnosis Date  . Anxiety   . Asthma   . COPD (chronic obstructive pulmonary disease) ([place])   . Depression     Past Surgical History:  Procedure Laterality Date  . ABDOMINAL HYSTERECTOMY     partial      Home Medications:  Prior to Admission medications   Medication Sig Start Date End Date Taking? Authorizing Provider  albuterol (PROAIR HFA) 108 (90 Base) MCG/ACT inhaler Inhale 2 puffs into the lungs every 6 (six) hours as needed for wheezing or shortness of breath. 07/25/19  Yes Tanda Rockers, MD  albuterol (PROVENTIL) (2.5 MG/3ML) 0.083% nebulizer solution Take 3 mLs (2.5 mg total) by nebulization every 6 (six) hours as needed for wheezing or shortness of breath. 09/10/19  Yes Tanda Rockers, MD  ALPRAZolam Duanne Moron) 0.25 MG tablet Take 0.25 mg by mouth 2 (two) times daily as needed for anxiety. 03/03/20  Yes [provider]  apixaban (ELIQUIS) 5 MG TABS tablet Take 1 tablet (5 mg total) by mouth 2 (two) times daily. 12/12/19  Yes Antonieta Pert, MD  atorvastatin (LIPITOR) 40 MG tablet Take 1 tablet (  40 mg total) by mouth daily. 09/12/19 12/04/19 Yes Florencia Reasons, MD  baclofen (LIORESAL) 10 MG tablet Take 10 mg by mouth 3 (three) times daily.  09/05/19  Yes [provider]  Baclofen 5 MG TABS Take 2.5-5 mg by mouth 2 (two) times daily as needed for pain. 03/01/20  Yes [provider]  budesonide (PULMICORT) 0.5 MG/2ML nebulizer solution Take 2 mLs (0.5  mg total) by nebulization 2 (two) times daily. 11/20/19  Yes Tanda Rockers, MD  Cholecalciferol (VITAMIN D3) 1.25 MG (50000 UT) CAPS Take 1 capsule by mouth once a week. 02/10/20  Yes [provider]  famotidine (PEPCID) 20 MG tablet One at bedtime Patient taking differently: Take 20 mg by mouth at bedtime. 02/23/20  Yes Tanda Rockers, MD  FASENRA 30 MG/ML SOSY INJECT 1 SYRINGE UNDER THE SKIN EVERY 8 WEEKS. Patient taking differently: Inject into the skin every 8 (eight) weeks. 02/10/20  Yes Tanda Rockers, MD  furosemide (LASIX) 40 MG tablet 1 tablet daily (40mg  total); May take additional tablet (40mg ) in the afternoon prn leg swelling/weight gain Patient taking differently: Take 40 mg by mouth daily as needed for fluid. 03/15/20  Yes Martyn Ehrich, NP  guaiFENesin (MUCINEX) 600 MG 12 hr tablet Take 1 tablet (600 mg total) by mouth 2 (two) times daily. Patient taking differently: Take 600 mg by mouth 2 (two) times daily as needed for cough. 09/12/19  Yes Florencia Reasons, MD  HYDROcodone-acetaminophen (NORCO/VICODIN) 5-325 MG tablet Take 1 tablet by mouth every 6 (six) hours as needed for up to 4 doses for severe pain. 12/12/19  Yes Antonieta Pert, MD  ibuprofen (ADVIL) 200 MG tablet Take 600 mg by mouth every 6 (six) hours as needed for fever, headache or mild pain.   Yes [provider]  lamoTRIgine (LAMICTAL) 200 MG tablet Take 200 mg by mouth at bedtime.    Yes [provider]  lidocaine (LIDODERM) 5 % Place 1 patch onto the skin daily. Remove & Discard patch within 12 hours or as directed by MD 09/12/19  Yes Florencia Reasons, MD  loratadine (CLARITIN) 10 MG tablet Take 10 mg by mouth every evening.    Yes [provider]  pantoprazole (PROTONIX) 40 MG tablet Take 1 tablet (40 mg total) by mouth daily. Take 30-60 min before first meal of the day 07/25/19  Yes Tanda Rockers, MD  risperiDONE (RISPERDAL) 1 MG tablet Take 2 mg by mouth at bedtime.   Yes [provider]   Tiotropium Bromide-Olodaterol (STIOLTO RESPIMAT) 2.5-2.5 MCG/ACT AERS Inhale 2 puffs into the lungs daily. 11/20/19  Yes Tanda Rockers, MD  venlafaxine XR (EFFEXOR-XR) 150 MG 24 hr capsule Take 150 mg by mouth at bedtime.    Yes [provider]  vitamin B-12 (CYANOCOBALAMIN) 500 MCG tablet Take 1 tablet (500 mcg total) by mouth daily. 11/11/19  Yes Dana Allan I, MD  barrier cream (NON-SPECIFIED) CREA Apply 1 application topically daily as needed (apply to rash). 11/10/19   Bonnell Public, MD  [employer] [facility]) OINT Apply 1 application topically daily as needed (rash).    [provider]  montelukast (SINGULAIR) 10 MG tablet Take 1 tablet (10 mg total) by mouth at bedtime. Patient not taking: No sig reported 02/23/20   Tanda Rockers, MD  potassium chloride (KLOR-CON) 10 MEQ tablet Take 10 mEq by mouth 2 (two) times daily. 03/01/20   [provider]  Potassium Chloride ER 20 MEQ TBCR  TAKE 2 TABLETS BY MOUTH DAILY FOR 3 DAYS( WHILE ON HIGHER DOSE LASIX) 03/10/20   Martyn Ehrich, NP  predniSONE (DELTASONE) 10 MG tablet Take 1 tablet (10 mg total) by mouth daily with breakfast. Patient not taking: No sig reported 12/13/19   Antonieta Pert, MD  QUEtiapine (SEROQUEL) 100 MG tablet Take 1.5 tablets (150 mg total) by mouth at bedtime. 11/10/19   Bonnell Public, MD  QUEtiapine (SEROQUEL) 50 MG tablet Take 150 mg by mouth at bedtime. 03/04/20   [provider]  risperiDONE (RISPERDAL) 3 MG tablet Take 1 tablet (3 mg total) by mouth at bedtime. Patient not taking: No sig reported 11/10/19   Dana Allan I, MD  senna-docusate (SENOKOT-S) 8.6-50 MG tablet Take 1 tablet by mouth 2 (two) times daily. 09/12/19   Florencia Reasons, MD    Inpatient Medications: Scheduled Meds: . apixaban  5 mg Oral BID  . aspirin EC  81 mg Oral Daily  . atorvastatin  40 mg Oral Daily  . azithromycin  500 mg Oral Daily  . baclofen  10 mg Oral TID  . budesonide  0.5 mg  Nebulization BID  . chlorhexidine  15 mL Mouth Rinse BID  . famotidine  20 mg Oral QHS  . fluticasone furoate-vilanterol  1 puff Inhalation Daily  . furosemide  40 mg Intravenous Daily  . ipratropium-albuterol  3 mL Nebulization Q6H  . lamoTRIgine  200 mg Oral QHS  . loratadine  10 mg Oral QPM  . mouth rinse  15 mL Mouth Rinse q12n4p  . pantoprazole  40 mg Oral Daily  . potassium chloride  20 mEq Oral Daily  . predniSONE  40 mg Oral Q breakfast  . risperiDONE  2 mg Oral QHS  . senna-docusate  1 tablet Oral BID  . venlafaxine XR  150 mg Oral QHS  . vitamin B-12  500 mcg Oral Daily   Continuous Infusions:  PRN Meds: albuterol, ALPRAZolam, ondansetron (ZOFRAN) IV  Allergies:    Allergies  Allergen Reactions  . Sulfa Antibiotics Other (See Comments)    Mouth gets raw    Social History:   Social History   Socioeconomic History  . Marital status: Divorced    Spouse name: Not on file  . Number of children: Not on file  . Years of education: Not on file  . Highest education level: Not on file  Occupational History  . Not on file  Tobacco Use  . Smoking status: Former Smoker    Packs/day: 1.00    Years: 46.00    Pack years: 46.00    Types: Cigarettes    Start date: 31    Quit date: 08/23/2017    Years since quitting: 2.5  . Smokeless tobacco: Never Used  Substance and Sexual Activity  . Alcohol use: No  . Drug use: No  . Sexual activity: Not Currently  Other Topics Concern  . Not on file  Social History Narrative  . Not on file   Social Determinants of Health   Financial Resource Strain: Not on file  Food Insecurity: Not on file  Transportation Needs: Not on file  Physical Activity: Not on file  Stress: Not on file  Social Connections: Not on file  Intimate Partner Violence: Not on file    Family History:    Family History  Problem Relation Age of Onset  . Asthma Mother   . Lung cancer Father   . COPD Father      ROS:  Please see the history of  present illness.   All other ROS reviewed and negative.     Physical Exam/Data:   Vitals:   03/21/20 1849 03/21/20 1952 03/21/20 2016 03/22/20 0543  BP:   (!) 151/85 102/68  Pulse:   99 93  Resp:   18 16  Temp:   98.5 F (36.9 C) 99.1 F (37.3 C)  TempSrc:   Oral Oral  SpO2: 95% 97% 94% 96%  Weight:      Height:        Intake/Output Summary (Last 24 hours) at 03/22/2020 0730 Last data filed at 03/22/2020 0027 Gross per 24 hour  Intake 490 ml  Output 1900 ml  Net -1410 ml   Last 3 Weights 03/21/2020 03/20/2020 03/05/2020  Weight (lbs) 230 lb 9.6 oz 199 lb 236 lb 12.8 oz  Weight (kg) 104.6 kg 90.266 kg 107.412 kg     Body mass index is 40.85 kg/m.  General:  Well nourished, well developed, in no acute distress HEENT: normal Lymph: no adenopathy Neck: no JVD Endocrine:  No thryomegaly Vascular: No carotid bruits; FA pulses 2+ bilaterally without bruits  Cardiac:  normal S1, S2; RRR; no murmur  Lungs:  clear to auscultation bilaterally, no wheezing, rhonchi or rales  Abd: soft, nontender, no hepatomegaly  Ext: no edema Musculoskeletal:  No deformities, BUE and BLE strength normal and equal Skin: warm and dry  Neuro:  CNs 2-12 intact, no focal abnormalities noted Psych:  Normal affect   EKG:  The EKG was personally reviewed and demonstrates:  Sinus tachycardia, rate 109 bpm, no STE/D, no significant change from previous Telemetry:  Telemetry was personally reviewed and demonstrates:  Sinus tachycardia the low 100's  Relevant CV Studies: Echocardiogram 03/21/20: 1. Mild hypokinesis of the mid to apical anteroseptum, inferoseptum and  inferior myocardium. Left ventricular ejection fraction, by estimation, is  45 to 50%. The left ventricle has mildly decreased function. The left  ventricle demonstrates regional wall  motion abnormalities (see scoring diagram/findings for description). Left  ventricular diastolic parameters are consistent with Grade I diastolic   dysfunction (impaired relaxation). Elevated left ventricular end-diastolic  pressure.  2. Right ventricular systolic function is normal. The right ventricular  size is normal. There is normal pulmonary artery systolic pressure.  3. The mitral valve is normal in structure. No evidence of mitral valve  regurgitation. No evidence of mitral stenosis.  4. The aortic valve is normal in structure. Aortic valve regurgitation is  not visualized. No aortic stenosis is present.  5. Aortic dilatation noted. There is mild dilatation of the ascending  aorta, measuring 41 mm.  6. The inferior vena cava is dilated in size with >50% respiratory  variability, suggesting right atrial pressure of 8 mmHg.   Laboratory Data:  High Sensitivity Troponin:   Recent Labs  Lab 03/20/20 2328 03/21/20 0128 03/21/20 0529 03/21/20 0735  TROPONINIHS 39* 50* 49* 41*     Chemistry Recent Labs  Lab 03/20/20 2150 03/22/20 0355  NA 141 138  K 4.9 4.6  CL 99 95*  CO2 32 35*  GLUCOSE 130* 143*  BUN 12 15  CREATININE 0.70 0.53  CALCIUM 9.9 10.5*  GFRNONAA >60 >60  ANIONGAP 10 8    No results for input(s): PROT, ALBUMIN, AST, ALT, ALKPHOS, BILITOT in the last 168 hours. Hematology Recent Labs  Lab 03/20/20 2234 03/22/20 0355  WBC 12.7* 12.8*  RBC 3.40* 3.44*  HGB 10.8* 11.0*  HCT 36.7 36.3  MCV 107.9* 105.5*  MCH 31.8 32.0  MCHC 29.4* 30.3  RDW 12.7 12.8  PLT 266 282   BNP Recent Labs  Lab 03/20/20 2327  BNP 76.8    DDimer No results for input(s): DDIMER in the last 168 hours.   Radiology/Studies:  DG Chest 2 View  Result Date: 03/20/2020 CLINICAL DATA:  Short of breath, asthma, COPD EXAM: CHEST - 2 VIEW COMPARISON:  03/05/2020 FINDINGS: Frontal and lateral views of the chest demonstrates stable enlargement of the cardiac silhouette. Chronic ectasia of the thoracic aorta. Chronic areas of scarring are seen throughout the lungs. There is central vascular congestion, with increased  interstitial prominence compatible with interstitial edema. No acute airspace disease, effusion, or pneumothorax. No acute bony abnormalities. IMPRESSION: 1. Worsening volume status, with central vascular congestion and interstitial edema. Electronically Signed   By: Randa Ngo M.D.   On: 03/20/2020 22:35   ECHOCARDIOGRAM COMPLETE  Result Date: 03/21/2020    ECHOCARDIOGRAM REPORT   Patient Name:   SHERICKA JOHNSTONE Date of Exam: 03/21/2020 Medical Rec #:  [id number]           Height:       63.0 in Accession #:    [id number]          Weight:       230.6 lb Date of Birth:  12/03/1950          BSA:          2.054 m Patient Age:    89 years            BP:           120/84 mmHg Patient Gender: F                   HR:           95 bpm. Exam Location:  Inpatient Procedure: 2D Echo, Cardiac Doppler, Color Doppler and Intracardiac            Opacification Agent Indications:    I50.40* Unspecified combined systolic (congestive) and diastolic                 (congestive) heart failure  History:        Patient has prior history of Echocardiogram examinations, most                 recent 11/04/2019. CHF, COPD, Signs/Symptoms:Shortness of Breath                 and Dyspnea; Risk Factors:Current Smoker and Hypertension.                 Hypoxia.  Sonographer:    Roseanna Rainbow RDCS Referring Phys: [id number] ALEXIS HUGELMEYER  Sonographer Comments: Technically difficult study due to poor echo windows, suboptimal parasternal window, suboptimal apical window, suboptimal subcostal window and patient is morbidly obese. Image acquisition challenging due to patient body habitus. Patient in high fowler's position. IMPRESSIONS  1. Mild hypokinesis of the mid to apical anteroseptum, inferoseptum and inferior myocardium. Left ventricular ejection fraction, by estimation, is 45 to 50%. The left ventricle has mildly decreased function. The left ventricle demonstrates regional wall  motion abnormalities (see scoring diagram/findings for  description). Left ventricular diastolic parameters are consistent with Grade I diastolic dysfunction (impaired relaxation). Elevated left ventricular end-diastolic pressure.  2. Right ventricular systolic function is normal. The right ventricular size is normal. There is normal pulmonary artery systolic pressure.  3. The mitral valve is normal in structure. No evidence of  mitral valve regurgitation. No evidence of mitral stenosis.  4. The aortic valve is normal in structure. Aortic valve regurgitation is not visualized. No aortic stenosis is present.  5. Aortic dilatation noted. There is mild dilatation of the ascending aorta, measuring 41 mm.  6. The inferior vena cava is dilated in size with >50% respiratory variability, suggesting right atrial pressure of 8 mmHg. FINDINGS  Left Ventricle: Mild hypokinesis of the mid to apical anteroseptum, inferoseptum and inferior myocardium. Left ventricular ejection fraction, by estimation, is 45 to 50%. The left ventricle has mildly decreased function. The left ventricle demonstrates regional wall motion abnormalities. Definity contrast agent was given IV to delineate the left ventricular endocardial borders. The left ventricular internal cavity size was normal in size. There is no left ventricular hypertrophy. Left ventricular diastolic parameters are consistent with Grade I diastolic dysfunction (impaired relaxation). Elevated left ventricular end-diastolic pressure. Right Ventricle: The right ventricular size is normal. No increase in right ventricular wall thickness. Right ventricular systolic function is normal. There is normal pulmonary artery systolic pressure. The tricuspid regurgitant velocity is 1.34 m/s, and  with an assumed right atrial pressure of 8 mmHg, the estimated right ventricular systolic pressure is 43.1 mmHg. Left Atrium: Left atrial size was normal in size. Right Atrium: Right atrial size was normal in size. Pericardium: There is no evidence of  pericardial effusion. Mitral Valve: The mitral valve is normal in structure. No evidence of mitral valve regurgitation. No evidence of mitral valve stenosis. Tricuspid Valve: The tricuspid valve is normal in structure. Tricuspid valve regurgitation is trivial. No evidence of tricuspid stenosis. Aortic Valve: The aortic valve is normal in structure. Aortic valve regurgitation is not visualized. No aortic stenosis is present. Pulmonic Valve: The pulmonic valve was normal in structure. Pulmonic valve regurgitation is not visualized. No evidence of pulmonic stenosis. Aorta: Aortic dilatation noted. There is mild dilatation of the ascending aorta, measuring 41 mm. Venous: The inferior vena cava is dilated in size with greater than 50% respiratory variability, suggesting right atrial pressure of 8 mmHg. IAS/Shunts: No atrial level shunt detected by color flow Doppler.  LEFT VENTRICLE PLAX 2D LVIDd:         4.30 cm     Diastology LVIDs:         3.40 cm     LV e' medial:    4.90 cm/s LV PW:         1.30 cm     LV E/e' medial:  14.2 LV IVS:        1.40 cm     LV e' lateral:   8.92 cm/s LVOT diam:     2.10 cm     LV E/e' lateral: 7.8 LV SV:         75 LV SV Index:   37 LVOT Area:     3.46 cm  LV Volumes (MOD) LV vol d, MOD A2C: 76.8 ml LV vol d, MOD A4C: 63.9 ml LV vol s, MOD A2C: 36.7 ml LV vol s, MOD A4C: 27.1 ml LV SV MOD A2C:     40.1 ml LV SV MOD A4C:     63.9 ml LV SV MOD BP:      38.5 ml RIGHT VENTRICLE             IVC RV S prime:     10.30 cm/s  IVC diam: 2.20 cm TAPSE (M-mode): 1.4 cm LEFT ATRIUM  Index       RIGHT ATRIUM           Index LA diam:        2.60 cm 1.27 cm/m  RA Area:     11.90 cm LA Vol (A2C):   25.5 ml 12.41 ml/m RA Volume:   29.50 ml  14.36 ml/m LA Vol (A4C):   17.7 ml 8.62 ml/m LA Biplane Vol: 22.6 ml 11.00 ml/m  AORTIC VALVE LVOT Vmax:   146.00 cm/s LVOT Vmean:  95.700 cm/s LVOT VTI:    0.217 m  AORTA Ao Root diam: 3.20 cm Ao Asc diam:  4.10 cm MITRAL VALVE                TRICUSPID  VALVE MV Area (PHT): 3.17 cm     TR Peak grad:   7.2 mmHg MV Decel Time: 239 msec     TR Vmax:        134.00 cm/s MV E velocity: 69.80 cm/s MV A velocity: 111.00 cm/s  SHUNTS MV E/A ratio:  0.63         Systemic VTI:  0.22 m                             Systemic Diam: 2.10 cm Skeet Latch MD Electronically signed by Skeet Latch MD Signature Date/Time: 03/21/2020/1:29:30 PM    Final      Assessment and Plan:   1. Acute combined CHF: patient presented with acutely worsening SOB 1 hour prior to arrival to the ED. She had acute on chronic respiratory failure requiring BiPAP for O2 support. She has COPD on home O2 2L via [place] at baseline. Perhaps some mild worsening DOE 1 week prior to arrival. EKG was non-ischemic. BNP wnl. HsTrop with low flat trend not c/w ACS. CXR c/w pulmonary vascular congestion. Echo showed EF 45-50% with apical anteroseptum, inferoseptum, and inferior myocardium hypokinesis, new compared to 10/2019. No complaints of chest pain. She was started on IV lasix 40mg  daily with UOP net -1.4L (though suspect intake is inaccurate). Weight is down 5lbs to 225lbs today. Etiology of cardiomyopathy is unknown but suspicious for ischemia given wall motion abnormalities. Risk factors for CAD included HLD, coronary artery calcifications on CT scan, pre-DM type 2, and obesity. BP has been intermittently elevated, though she does not carry a formal diagnosis of HTN.  - RAP elevated on echo at 25mmHg yesterday - SCr stable with diuresis - increase IV lasix 40mg   BID - BPs have been up and down so will hold on addition of BB at this time while diuresing - Will add on A1C for risk stratification - Continue to monitor strict I&Os and daily weights - Continue to monitor electrolytes closely and replete as needed to maintain K >4, Mg >2 - she has coronary artery calcifications on Chest CTA and give focal RWMAs and reduced EF in setting of acute CHF as well as tobacco use hx so recommend proceeding  with right and left heart cath once adequately diuersed - will hold Eliquis and transition to IV Heparin per pharmacy and restart once cath done - will plan cath Wed after Eliquis has been held for 3 doses - Shared Decision Making/Informed Consent The risks [stroke (1 in 1000), death (1 in 1000), kidney failure [usually temporary] (1 in 500), bleeding (1 in 200), allergic reaction [possibly serious] (1 in 200)], benefits (diagnostic support and management of coronary artery disease) and alternatives  of a cardiac catheterization were discussed in detail with Julia Daniels and she is willing to proceed.  2. Acute on chronic respiratory failure in patient with known COPD on home O2:  Patient presented with SOB and increased O2 demand. Likely multifactorial in the setting of #1 and COPD exacerbation. PCCM evaluated and recommended supportive care with nebulizers, steroids, and starting trelegy. She continues to require O2 via [place] 5L, up from baseline 2L.  - O2 sats today 96% on [place] 5L - Continue COPD management per primary team - Continue lasix as above  3. Elevated blood pressure without diagnosis of HTN: BP intermittently elevated this admission, though also with occasional soft BP's.  - Anticipate management in the context of #1  4. HLD: LDL 131 on atorvastatin 40mg  daily at home - Will increase atorvastatin to 80mg  daily given coronary artery calcifications on Chest CT with goal LDL <70 - Will need repeat FLP/LFTs in 6-8 weeks  5. Pre DM type 2: remote A1C 5.6 - Will check A1C for risk stratification  6. History of PE: diagnosed with non-occlusive PE in the segmental branch of LUL 10/2019.  - On eliquis with reported compliance - will hold and start IV Heparin for cath on Wed - Continue management per primary team  Signed:   Fransico Him, MD [facility] 03/22/2020  Risk Assessment/Risk Scores:        New [place] Heart Association (NYHA) Functional Class NYHA Class III        For  questions or updates, please contact [facility] Please consult [email or web address] for contact info under    Signed, Abigail Butts, PA-C  03/22/2020 7:30 AM

## 2020-03-22 NOTE — Progress Notes (Signed)
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   Past Medical History:  Anxiety Asthma COPD Depression.    Significant Hospital Events:     Consults:  PCCM  3/13   Procedures:  2d ech 3/13   Significant Diagnostic Tests:  Echo  3/13 >>>  Micro Data:  Resp viral panel 3/12 neg Sputum  >>>  Antimicrobials:  Zmax  3/13 >>>    Interim History / Subjective:  Feels better   Objective   Blood pressure 102/68, pulse 93, temperature 99.1 F (37.3 C), temperature source Oral, resp. rate 16, height 5\' 3"  (1.6 m), weight 102.4 kg, SpO2 96 %.        Intake/Output Summary (Last 24 hours) at 03/22/2020 1139 Last data filed at 03/22/2020 0027 Gross per 24 hour  Intake 490 ml  Output 900 ml  Net -410 ml   Filed Weights   03/20/20 2207 03/21/20 0639 03/22/20 0738  Weight: 90.3 kg 104.6 kg 102.4 kg    Examination: General this is a 70 year old female. She is sitting up in chair. No acute distress HENT neck is large. No JVD. MMM Pulm no wheezing. Dec bases. No accessory use Card rrr abd soft  Ext trace edema  Neuro intact    Resolved Hospital Problem list      Assessment & Plan:    Acute on chronic hypoxic and hypercarbic respiratory failure in setting of acute systolic w/ pulmonary edema superimposed on A  GOLD  III copd with AB component vs Asthma COPD overlap syndrome +/- COPD exacerbation  -On Fosenra and daily prednisone ->was on pred taper. Down to 10 mg. Every time she gets below 20mg  it seems she gets worse. Had been on 10mg /d for about 4-5 days before onset of worsening SOB, then had acute worsening. Probably a mix here of HF and exacerbation. Don't know which came first. Certainly possible that the worsening dyspnea was HF all along.   Plan Spoke w/ cards. Agree ischemia eval warranted Good candidate for bud/formoterol/yupelri after she gets her new insurance but will cont trelegy for now Discharge her to home on prednisone taper (would go slow decreasing 10mg  every 4 days, and hold at basement of 10mg  a day. Ideally we should try to see her in office shortly after she begins her 10mg /d dosing) Will check abg in am OFF BIPAP. If PCO2 > 50 we might want to consider trilogy at dc given body habitus and newly identified CM might benefit for nocturnal support. If PCO2 not > 50 we can consider formal PSG as out pt and hold off on CPAP or BIPAP as will be almost impossible to get it for her anyway.   She also has significant barriers to healthcare. Has to get get out of house via [facility]  and her son who lives w/ her can't do it alone. I will ask CM to see if they know of additional resources. I gave her the contact for [facility] [place] but perhaps social work knows of additional resources?   Erick Colace ACNP-BC [place] Pager # [phone] OR # [phone] if no answer

## 2020-03-22 NOTE — Plan of Care (Signed)
  Problem: Education: Goal: Knowledge of General Education information will improve Description: Including pain rating scale, medication(s)/side effects and non-pharmacologic comfort measures Outcome: Progressing   Problem: Health Behavior/Discharge Planning: Goal: Ability to manage health-related needs will improve Outcome: Progressing   Problem: Clinical Measurements: Goal: Ability to maintain clinical measurements within normal limits will improve Outcome: Progressing Goal: Will remain free from infection Outcome: Progressing Goal: Diagnostic test results will improve Outcome: Progressing Goal: Respiratory complications will improve Outcome: Progressing Goal: Cardiovascular complication will be avoided Outcome: Progressing   Problem: Coping: Goal: Level of anxiety will decrease Outcome: Progressing   Problem: Pain Managment: Goal: General experience of comfort will improve Outcome: Progressing   Problem: Safety: Goal: Ability to remain free from injury will improve Outcome: Progressing   Problem: Education: Goal: Ability to demonstrate management of disease process will improve Outcome: Progressing Goal: Ability to verbalize understanding of medication therapies will improve Outcome: Progressing Goal: Individualized Educational Video(s) Outcome: Progressing

## 2020-03-22 NOTE — Progress Notes (Signed)
Patient is requesting Seroquel. PCP was notified.

## 2020-03-22 NOTE — Progress Notes (Signed)
ANTICOAGULATION CONSULT NOTE - Initial Consult  Pharmacy Consult for heparin  Indication: pulmonary embolus- bridge therapy while apixaban on hold for cardiac cath  Allergies  Allergen Reactions   Sulfa Antibiotics Other (See Comments)    Mouth gets raw    Patient Measurements: Height: 5\' 3"  (160 cm) Weight: 102.4 kg (225 lb 12 oz) IBW/kg (Calculated) : 52.4 Heparin Dosing Weight: 77.2 kg  Vital Signs: Temp: 99.1 F (37.3 C) (03/14 0543) Temp Source: Oral (03/14 0543) BP: 102/68 (03/14 0543) Pulse Rate: 93 (03/14 0543)  Labs: Recent Labs    03/20/20 2150 03/20/20 2234 03/20/20 2328 03/21/20 0128 03/21/20 0529 03/21/20 0735 03/22/20 0355  HGB  --  10.8*  --   --   --   --  11.0*  HCT  --  36.7  --   --   --   --  36.3  PLT  --  266  --   --   --   --  282  CREATININE 0.70  --   --   --   --   --  0.53  TROPONINIHS  --   --    < > 50* 49* 41*  --    < > = values in this interval not displayed.    Estimated Creatinine Clearance: 78 mL/min (by C-G formula based on SCr of 0.53 mg/dL).   Medical History: Past Medical History:  Diagnosis Date   Anxiety    Asthma    COPD (chronic obstructive pulmonary disease) (HCC)    Depression     Medications:  Medications Prior to Admission  Medication Sig Dispense Refill Last Dose   albuterol (PROAIR HFA) 108 (90 Base) MCG/ACT inhaler Inhale 2 puffs into the lungs every 6 (six) hours as needed for wheezing or shortness of breath. 18 g 5 Past Week at unk   albuterol (PROVENTIL) (2.5 MG/3ML) 0.083% nebulizer solution Take 3 mLs (2.5 mg total) by nebulization every 6 (six) hours as needed for wheezing or shortness of breath. 300 mL 12 Past Week at unk   ALPRAZolam (XANAX) 0.25 MG tablet Take 0.25 mg by mouth 2 (two) times daily as needed for anxiety.   Past Week at unk   apixaban (ELIQUIS) 5 MG TABS tablet Take 1 tablet (5 mg total) by mouth 2 (two) times daily.   03/19/2020   atorvastatin (LIPITOR) 40 MG tablet Take 1  tablet (40 mg total) by mouth daily. 30 tablet 0 03/19/2020   baclofen (LIORESAL) 10 MG tablet Take 10 mg by mouth 3 (three) times daily.    Past Week at unk   Baclofen 5 MG TABS Take 2.5-5 mg by mouth 2 (two) times daily as needed for pain.   Past Week at Unknown time   budesonide (PULMICORT) 0.5 MG/2ML nebulizer solution Take 2 mLs (0.5 mg total) by nebulization 2 (two) times daily. 120 mL 12 03/19/2020   Cholecalciferol (VITAMIN D3) 1.25 MG (50000 UT) CAPS Take 1 capsule by mouth once a week.   Past Week at Unknown time   famotidine (PEPCID) 20 MG tablet One at bedtime (Patient taking differently: Take 20 mg by mouth at bedtime.) 90 tablet 3 03/19/2020   FASENRA 30 MG/ML SOSY INJECT 1 SYRINGE UNDER THE SKIN EVERY 8 WEEKS. (Patient taking differently: Inject into the skin every 8 (eight) weeks.) 1 mL 6 03/09/2020   furosemide (LASIX) 40 MG tablet 1 tablet daily (40mg  total); May take additional tablet (40mg ) in the afternoon prn leg swelling/weight gain (Patient  taking differently: Take 40 mg by mouth daily as needed for fluid.) 30 tablet 0 Past Month at Unknown time   guaiFENesin (MUCINEX) 600 MG 12 hr tablet Take 1 tablet (600 mg total) by mouth 2 (two) times daily. (Patient taking differently: Take 600 mg by mouth 2 (two) times daily as needed for cough.)   Past Week at Unknown time   HYDROcodone-acetaminophen (NORCO/VICODIN) 5-325 MG tablet Take 1 tablet by mouth every 6 (six) hours as needed for up to 4 doses for severe pain. 4 tablet 0 Past Week at Unknown time   ibuprofen (ADVIL) 200 MG tablet Take 600 mg by mouth every 6 (six) hours as needed for fever, headache or mild pain.   Past Week at Unknown time   lamoTRIgine (LAMICTAL) 200 MG tablet Take 200 mg by mouth at bedtime.    03/19/2020   lidocaine (LIDODERM) 5 % Place 1 patch onto the skin daily. Remove & Discard patch within 12 hours or as directed by MD 30 patch 0 Past Week at Unknown time   loratadine (CLARITIN) 10 MG tablet Take  10 mg by mouth every evening.    03/19/2020   pantoprazole (PROTONIX) 40 MG tablet Take 1 tablet (40 mg total) by mouth daily. Take 30-60 min before first meal of the day 30 tablet 11 03/19/2020   risperiDONE (RISPERDAL) 1 MG tablet Take 2 mg by mouth at bedtime.   03/19/2020   Tiotropium Bromide-Olodaterol (STIOLTO RESPIMAT) 2.5-2.5 MCG/ACT AERS Inhale 2 puffs into the lungs daily. 1 each 11 03/19/2020   venlafaxine XR (EFFEXOR-XR) 150 MG 24 hr capsule Take 150 mg by mouth at bedtime.    03/19/2020   vitamin B-12 (CYANOCOBALAMIN) 500 MCG tablet Take 1 tablet (500 mcg total) by mouth daily. 30 tablet 0 03/19/2020   barrier cream (NON-SPECIFIED) CREA Apply 1 application topically daily as needed (apply to rash). 1 each 1    Infant Care Products (DERMACLOUD) OINT Apply 1 application topically daily as needed (rash).   unk at unk   montelukast (SINGULAIR) 10 MG tablet Take 1 tablet (10 mg total) by mouth at bedtime. (Patient not taking: No sig reported) 30 tablet 11 Not Taking at Unknown time   potassium chloride (KLOR-CON) 10 MEQ tablet Take 10 mEq by mouth 2 (two) times daily.      Potassium Chloride ER 20 MEQ TBCR TAKE 2 TABLETS BY MOUTH DAILY FOR 3 DAYS( WHILE ON HIGHER DOSE LASIX) 6 tablet 0 unk at unk   predniSONE (DELTASONE) 10 MG tablet Take 1 tablet (10 mg total) by mouth daily with breakfast. (Patient not taking: No sig reported)   Completed Course at Unknown time   QUEtiapine (SEROQUEL) 100 MG tablet Take 1.5 tablets (150 mg total) by mouth at bedtime. 30 tablet 0    QUEtiapine (SEROQUEL) 50 MG tablet Take 150 mg by mouth at bedtime.      senna-docusate (SENOKOT-S) 8.6-50 MG tablet Take 1 tablet by mouth 2 (two) times daily.       Assessment: 70 yo F on apixban 5 mg po BID for hx PE, LD 3/24 1042 am.  To start heparin per pharmacy for bridge therapy.  Pt to have left & right heart cath 3/16.   Hg 11,  PLT WNL, SCr WNL.    Goal of Therapy:  APTT 66 - 102 sec Heparin level  0.3-0.7 units/ml Monitor platelets by anticoagulation protocol: Yes   Plan:  Get baseline aPTT/Heparin level now- expect heparin level to be elevated  due to apixaban At 2300 tonight start heparin with no bolus at rate of 1300 units/hr and check 8 hr aPTT/heparin level at 07 am 3/14 Daily CBC, aPTT, heparin level  Eudelia Bunch, Pharm.D 03/22/2020 12:42 PM

## 2020-03-23 DIAGNOSIS — J441 Chronic obstructive pulmonary disease with (acute) exacerbation: Secondary | ICD-10-CM

## 2020-03-23 DIAGNOSIS — J810 Acute pulmonary edema: Secondary | ICD-10-CM

## 2020-03-23 DIAGNOSIS — R0602 Shortness of breath: Secondary | ICD-10-CM

## 2020-03-23 LAB — CBC
HCT: 40.6 % (ref 36.0–46.0)
Hemoglobin: 12.6 g/dL (ref 12.0–15.0)
MCH: 31.8 pg (ref 26.0–34.0)
MCHC: 31 g/dL (ref 30.0–36.0)
MCV: 102.5 fL — ABNORMAL HIGH (ref 80.0–100.0)
Platelets: 332 10*3/uL (ref 150–400)
RBC: 3.96 MIL/uL (ref 3.87–5.11)
RDW: 12.6 % (ref 11.5–15.5)
WBC: 10.4 10*3/uL (ref 4.0–10.5)
nRBC: 0 % (ref 0.0–0.2)

## 2020-03-23 LAB — BASIC METABOLIC PANEL
Anion gap: 12 (ref 5–15)
BUN: 27 mg/dL — ABNORMAL HIGH (ref 8–23)
CO2: 33 mmol/L — ABNORMAL HIGH (ref 22–32)
Calcium: 10.4 mg/dL — ABNORMAL HIGH (ref 8.9–10.3)
Chloride: 94 mmol/L — ABNORMAL LOW (ref 98–111)
Creatinine, Ser: 0.79 mg/dL (ref 0.44–1.00)
GFR, Estimated: >60 mL/min (ref >60)
Glucose, Bld: 128 mg/dL — ABNORMAL HIGH (ref 70–99)
Potassium: 3.5 mmol/L (ref 3.5–5.1)
Sodium: 139 mmol/L (ref 135–145)

## 2020-03-23 LAB — BLOOD GAS, ARTERIAL
Acid-Base Excess: 13.1 mmol/L — ABNORMAL HIGH (ref 0.0–2.0)
Bicarbonate: 40.9 mmol/L — ABNORMAL HIGH (ref 20.0–28.0)
O2 Saturation: 95.5 %
Patient temperature: 98.6
pCO2 arterial: 72.9 mmHg (ref 32.0–48.0)
pH, Arterial: 7.367 (ref 7.350–7.450)
pO2, Arterial: 77.4 mmHg — ABNORMAL LOW (ref 83.0–108.0)

## 2020-03-23 LAB — APTT
aPTT: 31 seconds (ref 24–36)
aPTT: 41 seconds — ABNORMAL HIGH (ref 24–36)

## 2020-03-23 LAB — HEPARIN LEVEL (UNFRACTIONATED)
Heparin Unfractionated: 0.74 IU/mL — ABNORMAL HIGH (ref 0.30–0.70)
Heparin Unfractionated: 0.71 IU/mL — ABNORMAL HIGH (ref 0.30–0.70)

## 2020-03-23 MED ORDER — HEPARIN (PORCINE) 25000 UT/250ML-% IV SOLN
1700.0000 [IU]/h | INTRAVENOUS | Status: DC
  Administered 2020-03-24: 1850 [IU]/h via INTRAVENOUS
  Filled 2020-03-23 (×2): qty 250

## 2020-03-23 MED ORDER — HEPARIN BOLUS VIA INFUSION
2500.0000 [IU] | Freq: Once | INTRAVENOUS | Status: AC
  Administered 2020-03-23: 2500 [IU] via INTRAVENOUS
  Filled 2020-03-23: qty 2500

## 2020-03-23 MED ORDER — POTASSIUM CHLORIDE CRYS ER 20 MEQ PO TBCR
20.0000 meq | EXTENDED_RELEASE_TABLET | Freq: Every day | ORAL | Status: DC
  Administered 2020-03-23 – 2020-03-28 (×5): 20 meq via ORAL
  Filled 2020-03-23 (×6): qty 1

## 2020-03-23 MED ORDER — HEPARIN (PORCINE) 25000 UT/250ML-% IV SOLN
1600.0000 [IU]/h | INTRAVENOUS | Status: DC
  Administered 2020-03-23 (×2): 1600 [IU]/h via INTRAVENOUS
  Filled 2020-03-23 (×2): qty 250

## 2020-03-23 MED ORDER — SODIUM CHLORIDE 0.9% FLUSH
3.0000 mL | Freq: Two times a day (BID) | INTRAVENOUS | Status: DC
  Administered 2020-03-23 – 2020-04-01 (×8): 3 mL via INTRAVENOUS

## 2020-03-23 MED ORDER — HEPARIN BOLUS VIA INFUSION
2500.0000 [IU] | INTRAVENOUS | Status: AC
  Administered 2020-03-23: 2500 [IU] via INTRAVENOUS
  Filled 2020-03-23: qty 2500

## 2020-03-23 MED ORDER — IPRATROPIUM-ALBUTEROL 0.5-2.5 (3) MG/3ML IN SOLN
3.0000 mL | Freq: Three times a day (TID) | RESPIRATORY_TRACT | Status: DC
  Administered 2020-03-24: 3 mL via RESPIRATORY_TRACT
  Filled 2020-03-23: qty 3

## 2020-03-23 NOTE — Progress Notes (Signed)
Lab notified the RN with a Critical lab Value :Blood gas  pCO2 72.9.   PCP was notified

## 2020-03-23 NOTE — H&P (View-Only) (Signed)
Progress Note  Patient Name: Julia Daniels Date of Encounter: 03/23/2020  [facility] Cardiologist: Fransico Him, MD  Subjective   Breathing improving since admit. Getting nebulizer tx.   Inpatient Medications    Scheduled Meds: . aspirin EC  81 mg Oral Daily  . atorvastatin  80 mg Oral Daily  . azithromycin  500 mg Oral Daily  . baclofen  10 mg Oral TID  . budesonide  0.5 mg Nebulization BID  . chlorhexidine  15 mL Mouth Rinse BID  . famotidine  20 mg Oral QHS  . fluticasone furoate-vilanterol  1 puff Inhalation Daily  . furosemide  40 mg Intravenous BID  . ipratropium-albuterol  3 mL Nebulization Q6H  . lamoTRIgine  200 mg Oral QHS  . loratadine  10 mg Oral QPM  . mouth rinse  15 mL Mouth Rinse q12n4p  . pantoprazole  40 mg Oral Daily  . potassium chloride  20 mEq Oral Daily  . predniSONE  40 mg Oral Q breakfast  . QUEtiapine  150 mg Oral QHS  . risperiDONE  2 mg Oral QHS  . senna-docusate  1 tablet Oral BID  . venlafaxine XR  150 mg Oral QHS  . vitamin B-12  500 mcg Oral Daily   Continuous Infusions: . heparin 1,300 Units/hr (03/22/20 2335)   PRN Meds: albuterol, ALPRAZolam, ondansetron (ZOFRAN) IV   Vital Signs    Vitals:   03/22/20 1532 03/22/20 2106 03/23/20 0628 03/23/20 0816  BP: 112/71 118/74 101/82   Pulse: (!) 102 100 90   Resp: (!) 22 20 18    Temp:  98.2 F (36.8 C) 97.8 F (36.6 C)   TempSrc:  Oral Oral   SpO2: 94% 95% 97% 99%  Weight:   101.5 kg   Height:        Intake/Output Summary (Last 24 hours) at 03/23/2020 0827 Last data filed at 03/23/2020 0600 Gross per 24 hour  Intake 542.97 ml  Output 3900 ml  Net -3357.03 ml   Last 3 Weights 03/23/2020 03/22/2020 03/21/2020  Weight (lbs) 223 lb 12.3 oz 225 lb 12 oz 230 lb 9.6 oz  Weight (kg) 101.5 kg 102.4 kg 104.6 kg      Telemetry    SR - Personally Reviewed  ECG    No new tracing   Physical Exam   GEN: No acute distress.   Neck: No JVD Cardiac: RRR, no murmurs, rubs,  or gallops.  Respiratory: Clear to auscultation bilaterally. GI: Soft, nontender, non-distended  MS: No edema; No deformity. Neuro:  Nonfocal  Psych: Normal affect   Labs    High Sensitivity Troponin:   Recent Labs  Lab 03/20/20 2328 03/21/20 0128 03/21/20 0529 03/21/20 0735  TROPONINIHS 39* 50* 49* 41*      Chemistry Recent Labs  Lab 03/20/20 2150 03/22/20 0355  NA 141 138  K 4.9 4.6  CL 99 95*  CO2 32 35*  GLUCOSE 130* 143*  BUN 12 15  CREATININE 0.70 0.53  CALCIUM 9.9 10.5*  GFRNONAA >60 >60  ANIONGAP 10 8     Hematology Recent Labs  Lab 03/20/20 2234 03/22/20 0355  WBC 12.7* 12.8*  RBC 3.40* 3.44*  HGB 10.8* 11.0*  HCT 36.7 36.3  MCV 107.9* 105.5*  MCH 31.8 32.0  MCHC 29.4* 30.3  RDW 12.7 12.8  PLT 266 282    BNP Recent Labs  Lab 03/20/20 2327  BNP 76.8     DDimer No results for input(s): DDIMER in the last  168 hours.   Radiology    ECHOCARDIOGRAM COMPLETE  Result Date: 03/21/2020    ECHOCARDIOGRAM REPORT   Patient Name:   BRENNA FRIESENHAHN Date of Exam: 03/21/2020 Medical Rec #:  [id number]           Height:       63.0 in Accession #:    [id number]          Weight:       230.6 lb Date of Birth:  05-23-1950          BSA:          2.054 m Patient Age:    70 years            BP:           120/84 mmHg Patient Gender: F                   HR:           95 bpm. Exam Location:  Inpatient Procedure: 2D Echo, Cardiac Doppler, Color Doppler and Intracardiac            Opacification Agent Indications:    I50.40* Unspecified combined systolic (congestive) and diastolic                 (congestive) heart failure  History:        Patient has prior history of Echocardiogram examinations, most                 recent 11/04/2019. CHF, COPD, Signs/Symptoms:Shortness of Breath                 and Dyspnea; Risk Factors:Current Smoker and Hypertension.                 Hypoxia.  Sonographer:    Roseanna Rainbow RDCS Referring Phys: [id number] ALEXIS HUGELMEYER  Sonographer  Comments: Technically difficult study due to poor echo windows, suboptimal parasternal window, suboptimal apical window, suboptimal subcostal window and patient is morbidly obese. Image acquisition challenging due to patient body habitus. Patient in high fowler's position. IMPRESSIONS  1. Mild hypokinesis of the mid to apical anteroseptum, inferoseptum and inferior myocardium. Left ventricular ejection fraction, by estimation, is 45 to 50%. The left ventricle has mildly decreased function. The left ventricle demonstrates regional wall  motion abnormalities (see scoring diagram/findings for description). Left ventricular diastolic parameters are consistent with Grade I diastolic dysfunction (impaired relaxation). Elevated left ventricular end-diastolic pressure.  2. Right ventricular systolic function is normal. The right ventricular size is normal. There is normal pulmonary artery systolic pressure.  3. The mitral valve is normal in structure. No evidence of mitral valve regurgitation. No evidence of mitral stenosis.  4. The aortic valve is normal in structure. Aortic valve regurgitation is not visualized. No aortic stenosis is present.  5. Aortic dilatation noted. There is mild dilatation of the ascending aorta, measuring 41 mm.  6. The inferior vena cava is dilated in size with >50% respiratory variability, suggesting right atrial pressure of 8 mmHg. FINDINGS  Left Ventricle: Mild hypokinesis of the mid to apical anteroseptum, inferoseptum and inferior myocardium. Left ventricular ejection fraction, by estimation, is 45 to 50%. The left ventricle has mildly decreased function. The left ventricle demonstrates regional wall motion abnormalities. Definity contrast agent was given IV to delineate the left ventricular endocardial borders. The left ventricular internal cavity size was normal in size. There is no left ventricular hypertrophy. Left ventricular diastolic parameters are consistent with Grade I  diastolic  dysfunction (impaired relaxation). Elevated left ventricular end-diastolic pressure. Right Ventricle: The right ventricular size is normal. No increase in right ventricular wall thickness. Right ventricular systolic function is normal. There is normal pulmonary artery systolic pressure. The tricuspid regurgitant velocity is 1.34 m/s, and  with an assumed right atrial pressure of 8 mmHg, the estimated right ventricular systolic pressure is 57.8 mmHg. Left Atrium: Left atrial size was normal in size. Right Atrium: Right atrial size was normal in size. Pericardium: There is no evidence of pericardial effusion. Mitral Valve: The mitral valve is normal in structure. No evidence of mitral valve regurgitation. No evidence of mitral valve stenosis. Tricuspid Valve: The tricuspid valve is normal in structure. Tricuspid valve regurgitation is trivial. No evidence of tricuspid stenosis. Aortic Valve: The aortic valve is normal in structure. Aortic valve regurgitation is not visualized. No aortic stenosis is present. Pulmonic Valve: The pulmonic valve was normal in structure. Pulmonic valve regurgitation is not visualized. No evidence of pulmonic stenosis. Aorta: Aortic dilatation noted. There is mild dilatation of the ascending aorta, measuring 41 mm. Venous: The inferior vena cava is dilated in size with greater than 50% respiratory variability, suggesting right atrial pressure of 8 mmHg. IAS/Shunts: No atrial level shunt detected by color flow Doppler.  LEFT VENTRICLE PLAX 2D LVIDd:         4.30 cm     Diastology LVIDs:         3.40 cm     LV e' medial:    4.90 cm/s LV PW:         1.30 cm     LV E/e' medial:  14.2 LV IVS:        1.40 cm     LV e' lateral:   8.92 cm/s LVOT diam:     2.10 cm     LV E/e' lateral: 7.8 LV SV:         75 LV SV Index:   37 LVOT Area:     3.46 cm  LV Volumes (MOD) LV vol d, MOD A2C: 76.8 ml LV vol d, MOD A4C: 63.9 ml LV vol s, MOD A2C: 36.7 ml LV vol s, MOD A4C: 27.1 ml LV SV MOD A2C:     40.1 ml  LV SV MOD A4C:     63.9 ml LV SV MOD BP:      38.5 ml RIGHT VENTRICLE             IVC RV S prime:     10.30 cm/s  IVC diam: 2.20 cm TAPSE (M-mode): 1.4 cm LEFT ATRIUM             Index       RIGHT ATRIUM           Index LA diam:        2.60 cm 1.27 cm/m  RA Area:     11.90 cm LA Vol (A2C):   25.5 ml 12.41 ml/m RA Volume:   29.50 ml  14.36 ml/m LA Vol (A4C):   17.7 ml 8.62 ml/m LA Biplane Vol: 22.6 ml 11.00 ml/m  AORTIC VALVE LVOT Vmax:   146.00 cm/s LVOT Vmean:  95.700 cm/s LVOT VTI:    0.217 m  AORTA Ao Root diam: 3.20 cm Ao Asc diam:  4.10 cm MITRAL VALVE                TRICUSPID VALVE MV Area (PHT): 3.17 cm     TR Peak grad:  7.2 mmHg MV Decel Time: 239 msec     TR Vmax:        134.00 cm/s MV E velocity: 69.80 cm/s MV A velocity: 111.00 cm/s  SHUNTS MV E/A ratio:  0.63         Systemic VTI:  0.22 m                             Systemic Diam: 2.10 cm Skeet Latch MD Electronically signed by Skeet Latch MD Signature Date/Time: 03/21/2020/1:29:30 PM    Final     Cardiac Studies   Echocardiogram 03/21/20: 1. Mild hypokinesis of the mid to apical anteroseptum, inferoseptum and  inferior myocardium. Left ventricular ejection fraction, by estimation, is  45 to 50%. The left ventricle has mildly decreased function. The left  ventricle demonstrates regional wall  motion abnormalities (see scoring diagram/findings for description). Left  ventricular diastolic parameters are consistent with Grade I diastolic  dysfunction (impaired relaxation). Elevated left ventricular end-diastolic  pressure.  2. Right ventricular systolic function is normal. The right ventricular  size is normal. There is normal pulmonary artery systolic pressure.  3. The mitral valve is normal in structure. No evidence of mitral valve  regurgitation. No evidence of mitral stenosis.  4. The aortic valve is normal in structure. Aortic valve regurgitation is  not visualized. No aortic stenosis is present.  5. Aortic  dilatation noted. There is mild dilatation of the ascending  aorta, measuring 41 mm.  6. The inferior vena cava is dilated in size with >50% respiratory  variability, suggesting right atrial pressure of 8 mmHg.   Patient Profile     70 y.o. female with a PMH of chronic diastolic CHF, HLD, COPD on home O2, anxiety, depression, GERD, and PE on Eliquis, who is being seen for the evaluation of acute combined CHF in setting of acute on chronic respiratory failure requiring BiPAP for O2 support.  Assessment & Plan    1. Acute combined CHF - Echo showed EF 45-50% with apical anteroseptum, inferoseptum, and inferior myocardium hypokinesis, new compared to 10/2019.  - Diuresed 4.7L so far. Weight down 230>>223lb.  - SOB is multifactorial - Recommended R & L cath (would be tomorrow after Eliquis hold for 3 doses)  given risk factors and focal RWMs on echo - Will review heart failure regiment post cath - Can consider bisoprolol for pulmonary stand point - Continue IV diuresis, hold tomorrow AM for cath   2. Hx of PE - Holding Eliquis for cath - on heparin  3. HLD - 03/21/2020: Cholesterol 213; HDL 73; LDL Cholesterol 131; Triglycerides 44; VLDL 9  - LDL goal less than 70 - Lipitor increased to 80mg  qd - Lipid panel and LFTS in 6-8 weeks  4. Acute on chronic respiratory failure 5. COPD exacerbation - Per primary team    For questions or updates, please contact [facility] Please consult [email or web address] for contact info under        SignedLeanor Kail, PA  03/23/2020, 8:27 AM

## 2020-03-23 NOTE — Progress Notes (Signed)
[place] for heparin  Indication: pulmonary embolus- bridge therapy while apixaban on hold for cardiac cath  Allergies  Allergen Reactions  . Sulfa Antibiotics Other (See Comments)    Mouth gets raw    Patient Measurements: Height: 5\' 3"  (160 cm) Weight: 101.5 kg (223 lb 12.3 oz) IBW/kg (Calculated) : 52.4 Heparin Dosing Weight: 77.2 kg  Vital Signs: Temp: 97.8 F (36.6 C) (03/15 0628) Temp Source: Oral (03/15 0628) BP: 101/82 (03/15 0628) Pulse Rate: 90 (03/15 0628)  Labs: Recent Labs    03/20/20 2150 03/20/20 2234 03/20/20 2328 03/21/20 0128 03/21/20 0529 03/21/20 0735 03/22/20 0355 03/22/20 1305 03/23/20 0828  HGB  --  10.8*  --   --   --   --  11.0*  --   --   HCT  --  36.7  --   --   --   --  36.3  --   --   PLT  --  266  --   --   --   --  282  --   --   APTT  --   --   --   --   --   --   --  28 31  HEPARINUNFRC  --   --   --   --   --   --   --  >2.20* 0.74*  CREATININE 0.70  --   --   --   --   --  0.53  --  0.79  TROPONINIHS  --   --    < > 50* 49* 41*  --   --   --    < > = values in this interval not displayed.    Estimated Creatinine Clearance: 77.6 mL/min (by C-G formula based on SCr of 0.79 mg/dL).   Assessment: 70 yo F on apixban 5 mg po BID for hx PE, LD 3/24 1042 am.  To start heparin per pharmacy for bridge therapy.  Pt to have left & right heart cath 3/16.   Hg 11,  PLT WNL, SCr WNL.   3/14 Baseline aPTT 28, baseline heparin level > 2.2 - elevated d/t apixaban 03/23/2020 First aPTT 31 sec, below goal, heparin level 0.74 -elevated d/t apixaban; drawn 8 hours after heparin started with no bolus at 1300 units/hr No bleeding reported. No infusion issues reported  Goal of Therapy:  APTT 66 - 102 sec Heparin level 0.3-0.7 units/ml Monitor platelets by anticoagulation protocol: Yes   Plan:  Bolus 2500 units IV x 1, then increase heparin to 1600 units/hr units/hr and check 8 hr aPTT/heparin level/CBC Daily  CBC, aPTT, heparin level For R & L heart cath tomorrow- will f/u anticoag plans  Eudelia Bunch, Pharm.D 03/23/2020 9:34 AM

## 2020-03-23 NOTE — Progress Notes (Signed)
PROGRESS NOTE   Julia Daniels  GDJ:[id number]    DOB: [birth year]    DOA: 03/20/2020  PCP: Simona Huh, NP   I have briefly reviewed patients previous medical records in [facility].  Chief Complaint  Patient presents with  . Shortness of Breath    Brief Narrative:  70 year old female, lives with her son, ambulates with the help of a walker/wheelchair, mostly sedentary, medical history significant for but not limited to anxiety and depression, asthma, COPD, chronic respiratory failure with hypoxia/hypercarbia on home oxygen 2-3 L/min via nasal cannula continuously, former smoker, chronic diastolic CHF, pulmonary embolism on Eliquis, chronic back pain and hyperlipidemia who presented to the ED via EMS with fairly subacute onset of dyspnea.  EMS administered epinephrine, albuterol, Atrovent, Solu-Medrol, magnesium and she was placed on CPAP.  Initially hypotensive in the ED but stabilized.  Admitted for acute on chronic respiratory failure with hypoxia due to COPD and CHF exacerbation.  Briefly required BiPAP but was weaned off same.  Seen by pulmonology.  Cardiology consulted for new cardiomyopathy/CHF management assistance.  Was not comfortable with BiPAP last night,?  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  2D echo: LVEF 45-50% with regional wall motion abnormalities (mild hypokinesis of the mid to apical anteroseptum, inferoseptum and inferior myocardium).  Grade 1 diastolic dysfunction  Briefly required BiPAP on admission.  Titrate oxygen to saturations between 88-92%.    Flutter valve and incentive spirometry.  Lasix  increased to 40 mg IV twice daily yesterday.    S/p IV Solu-Medrol x1 day followed by p.o. prednisone.  PCCM recommends slow prednisone taper (decreased by 10 mg every 4 days with plan to hold at 10 mg) and they will try to see her in the office shortly after she gets to the 10 mg dose because she has worsened in the past while on this dose.  ABG 3/15: pH 7.37, PCO2 73, PO2 77.  Given a.m. CO2 retention, pulmonary recommends BiPAP at HS while hospitalized and trilogy at discharge.  -5.6 L thus far, continuing IV Lasix 40 mg every 12 hours.  Continue budesonide nebs, duo nebs, Breo Ellipta and as needed albuterol nebs, course of azithromycin.  Follows with Dr. Melvyn Novas, PCCM in office.  Slightly more dyspneic this morning.  Agree that her dyspnea is likely multifactorial related to COPD, CHF, obesity, deconditioning and may even have OSA/OHS.  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  Planning to rule out ischemic etiology  No anginal chest pain reported.  Troponin low in the 30s-50 range, flat trend.  Likely elevated due to demand ischemia from acute respiratory failure  Cardiology follow-up appreciated.  Continuing IV Lasix 40 mg twice daily until LHC/RHC on 3/16, Eliquis held and on IV heparin since 3/14 for cath, continue high-dose statins, continue aspirin, considering bisoprolol.  Pulmonary embolism  Compliant with home dose of Eliquis.  Now Eliquis held and on IV heparin for cath.  Given response of her presentation to treatment for COPD/CHF, PE seems less likely.  Hyperlipidemia  Statins  GERD  Protonix  Anxiety and depression  Continue Risperdal, Effexor, Lamictal and  Seroquel.  Macrocytic anemia  Unclear etiology.  Stable.  Outpatient follow-up.   Body mass index is 39.64 kg/m./Morbid obesity    DVT prophylaxis:      Code Status: DNR Family Communication: Discussed in detail with patient's son on 3/14, updated  care and answered all questions.  He was appreciative of the call. Disposition:  Status is: Inpatient  Remains inpatient appropriate because:Inpatient level of care appropriate due to severity of illness   Dispo: The patient is from: Home              Anticipated d/c is to: Home              Patient currently is not medically stable to d/c.   Difficult to place patient No      Consultants:   Pulmonology Cardiology  Procedures:   BiPAP  Antimicrobials:    Anti-infectives (From admission, onward)   Start     Dose/Rate Route Frequency Ordered Stop   03/22/20 0730  azithromycin (ZITHROMAX) tablet 500 mg       "Followed by" Linked Group Details   500 mg Oral Daily 03/21/20 0638 03/26/20 0959   03/21/20 0330  azithromycin (ZITHROMAX) 500 mg in sodium chloride 0.9 % 250 mL IVPB       "Followed by" Linked Group Details   500 mg 250 mL/hr over 60 Minutes Intravenous Every 24 hours 03/21/20 0638 03/21/20 1149        Subjective:  Reports feeling slightly more short of breath this morning after breathing treatment.  States that that loosen the secretions.  Objective:   Vitals:   03/23/20 0628 03/23/20 0816 03/23/20 1347 03/23/20 1409  BP: 101/82   (!) 143/82  Pulse: 90   (!) 101  Resp: 18     Temp: 97.8 F (36.6 C)   98.9 F (37.2 C)  TempSrc: Oral   Oral  SpO2: 97% 99% 99% 96%  Weight: 101.5 kg     Height:        General exam: Middle-age female, moderately built and morbidly obese lying propped up in bed without distress.  Not overtly distressed. Respiratory system: Slightly diminished breath sounds with occasional rhonchi.  No crackles.  No increased work of breathing. Cardiovascular system: S1 and S2 heard, RRR.  Difficult to appreciate JVD.  No murmurs.  No pedal edema.  Telemetry personally reviewed: Sinus rhythm. Gastrointestinal system: Abdomen is nondistended, soft and nontender. No organomegaly or masses felt. Normal bowel sounds heard. Central nervous system:  Alert and oriented. No focal neurological deficits. Extremities: Symmetric 5 x 5 power. Skin: No rashes, lesions or ulcers Psychiatry: Judgement and insight appear normal. Mood & affect appropriate.     Data Reviewed:   I have personally reviewed following labs and imaging studies   CBC: Recent Labs  Lab 03/20/20 2234 03/22/20 0355  WBC 12.7* 12.8*  NEUTROABS 10.7*  --   HGB 10.8* 11.0*  HCT 36.7 36.3  MCV 107.9* 105.5*  PLT 266 428    Basic Metabolic Panel: Recent Labs  Lab 03/20/20 2150 03/22/20 0355 03/23/20 0828  NA 141 138 139  K 4.9 4.6 3.5  CL 99 95* 94*  CO2 32 35* 33*  GLUCOSE 130* 143* 128*  BUN 12 15 27*  CREATININE 0.70 0.53 0.79  CALCIUM 9.9 10.5* 10.4*    Liver Function Tests: No results for input(s): AST, ALT, ALKPHOS, BILITOT, PROT, ALBUMIN in the last 168 hours.  CBG: No results for input(s): GLUCAP in the last  168 hours.  Microbiology Studies:   Recent Results (from the past 240 hour(s))  Resp Panel by RT-PCR (Flu A&B, Covid) Nasopharyngeal Swab     Status: None   Collection Time: 03/20/20  9:50 PM   Specimen: Nasopharyngeal Swab; Nasopharyngeal(NP) swabs in vial transport medium  Result Value Ref Range Status   SARS Coronavirus 2 by RT PCR NEGATIVE NEGATIVE Final    Comment: (NOTE) SARS-CoV-2 target nucleic acids are NOT DETECTED.  The SARS-CoV-2 RNA is generally detectable in upper respiratory specimens during the acute phase of infection. The lowest concentration of SARS-CoV-2 viral copies this assay can detect is 138 copies/mL. A negative result does not preclude SARS-Cov-2 infection and should not be used as the sole basis for treatment or other patient management decisions. A negative result may occur with  improper specimen collection/handling, submission of specimen other than nasopharyngeal swab, presence of viral mutation(s) within the areas targeted by this assay, and inadequate number of viral copies(<138 copies/mL). A  negative result must be combined with clinical observations, patient history, and epidemiological information. The expected result is Negative.  Fact Sheet for Patients:  EntrepreneurPulse.com.au  Fact Sheet for Healthcare Providers:  IncredibleEmployment.be  This test is no t yet approved or cleared by the [place] FDA and  has been authorized for detection and/or diagnosis of SARS-CoV-2 by FDA under an Emergency Use Authorization (EUA). This EUA will remain  in effect (meaning this test can be used) for the duration of the COVID-19 declaration under Section 564(b)(1) of the Act, 21 U.S.C.section 360bbb-3(b)(1), unless the authorization is terminated  or revoked sooner.       Influenza A by PCR NEGATIVE NEGATIVE Final   Influenza B by PCR NEGATIVE NEGATIVE Final    Comment: (NOTE) The Xpert Xpress SARS-CoV-2/FLU/RSV plus assay is intended as an aid in the diagnosis of influenza from Nasopharyngeal swab specimens and should not be used as a sole basis for treatment. Nasal washings and aspirates are unacceptable for Xpert Xpress SARS-CoV-2/FLU/RSV testing.  Fact Sheet for Patients: EntrepreneurPulse.com.au  Fact Sheet for Healthcare Providers: IncredibleEmployment.be  This test is not yet approved or cleared by the [place] FDA and has been authorized for detection and/or diagnosis of SARS-CoV-2 by FDA under an Emergency Use Authorization (EUA). This EUA will remain in effect (meaning this test can be used) for the duration of the COVID-19 declaration under Section 564(b)(1) of the Act, 21 U.S.C. section 360bbb-3(b)(1), unless the authorization is terminated or revoked.  Performed at [facility], [place] [address], [place], [place] [address]      Radiology Studies:  No results found.   Scheduled Meds:   . aspirin EC  81 mg Oral Daily  . atorvastatin  80 mg Oral Daily   . azithromycin  500 mg Oral Daily  . baclofen  10 mg Oral TID  . budesonide  0.5 mg Nebulization BID  . chlorhexidine  15 mL Mouth Rinse BID  . famotidine  20 mg Oral QHS  . fluticasone furoate-vilanterol  1 puff Inhalation Daily  . furosemide  40 mg Intravenous BID  . ipratropium-albuterol  3 mL Nebulization Q6H  . lamoTRIgine  200 mg Oral QHS  . loratadine  10 mg Oral QPM  . mouth rinse  15 mL Mouth Rinse q12n4p  . pantoprazole  40 mg Oral Daily  . potassium chloride  20 mEq Oral Daily  . predniSONE  40 mg Oral Q breakfast  . QUEtiapine  150 mg Oral QHS  .  risperiDONE  2 mg Oral QHS  . senna-docusate  1 tablet Oral BID  . sodium chloride flush  3 mL Intravenous Q12H  . venlafaxine XR  150 mg Oral QHS  . vitamin B-12  500 mcg Oral Daily    Continuous Infusions:   . heparin 1,600 Units/hr (03/23/20 0958)     LOS: 2 days     Vernell Leep, MD, [place], [facility]. [facility]    To contact the attending provider between 7A-7P or the covering provider during after hours 7P-7A, please log into the web site [email or web address] and access using universal [place] password for that web site. If you do not have the password, please call the hospital operator.  03/23/2020, 2:56 PM

## 2020-03-23 NOTE — Progress Notes (Signed)
   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   Past Medical History:  Anxiety Asthma COPD Depression.    Significant Hospital Events:     Consults:  PCCM  3/13   Procedures:  2d ech 3/13   Significant Diagnostic Tests:  Echo  3/13 >>>1. Mild hypokinesis of the mid to apical anteroseptum, inferoseptum and  inferior myocardium. Left ventricular ejection fraction, by estimation, is  45 to 50%. The left ventricle has mildly decreased function. The left ventricle demonstrates regional wall  motion abnormalities (see scoring diagram/findings for description). Left ventricular diastolic parameters are consistent with Grade I diastolic dysfunction (impaired relaxation). Elevated left ventricular end-diastolic pressure. 2. Right ventricular systolic function is normal.  Micro Data:  Resp viral panel 3/12 neg Sputum  >>>  Antimicrobials:  Zmax  3/13 >>>    Interim History / Subjective:  Feels a little more short of breath today  abg w/ more hypercarbia  Objective   Blood pressure 101/82, pulse 90, temperature 97.8 F (36.6 C), temperature source Oral, resp. rate 18, height 5\' 3"  (1.6 m), weight 101.5 kg, SpO2 99 %.        Intake/Output Summary (Last 24 hours) at 03/23/2020 0951 Last data filed at 03/23/2020 0600 Gross per  24 hour  Intake 542.97 ml  Output 3900 ml  Net -3357.03 ml   Filed Weights   03/21/20 0639 03/22/20 0738 03/23/20 0628  Weight: 104.6 kg 102.4 kg 101.5 kg    Examination: General 70 yo female resting in bed. She is in no distress HENT MMM no clear JVD no stridor or upper airway noises pulm dec bases prolonged exhale phase but no wheezing Card rrr abd soft not tender  Ext warm and dry + LE edema Neuro intact    Resolved Hospital Problem list      Assessment & Plan:    Acute on chronic hypoxic and hypercarbic respiratory failure in setting of acute systolic w/ pulmonary edema superimposed on A  GOLD III copd with AB component vs Asthma COPD overlap syndrome +/- COPD exacerbation -think this is a  mix here of HF +/- her underlying COPD. Given her sig resting hypercarbia if this truly reflects baseline she would benefit from nocturnal ventilation  Plan Cont lasix as able Cont BDs Slow pred taper (dec by 10mg  every 4 d w/ plan to hold at 10mg ; ideally see [place] in office shortly after gets to the 10mg  dose) Will re-try BIPAP at HS.If these abnormal clinical findings persist, appropriate workup will be completed. The patient understands that follow up is required to elucidate the situation. She can tolerate then we should consider trilogy    Erick Colace ACNP-BC [place] Pager # [phone] OR # [phone] if no answer

## 2020-03-23 NOTE — Progress Notes (Signed)
Progress Note  Patient Name: Julia Daniels Date of Encounter: 03/23/2020  [facility] Cardiologist: Fransico Him, MD  Subjective   Breathing improving since admit. Getting nebulizer tx.   Inpatient Medications    Scheduled Meds: . aspirin EC  81 mg Oral Daily  . atorvastatin  80 mg Oral Daily  . azithromycin  500 mg Oral Daily  . baclofen  10 mg Oral TID  . budesonide  0.5 mg Nebulization BID  . chlorhexidine  15 mL Mouth Rinse BID  . famotidine  20 mg Oral QHS  . fluticasone furoate-vilanterol  1 puff Inhalation Daily  . furosemide  40 mg Intravenous BID  . ipratropium-albuterol  3 mL Nebulization Q6H  . lamoTRIgine  200 mg Oral QHS  . loratadine  10 mg Oral QPM  . mouth rinse  15 mL Mouth Rinse q12n4p  . pantoprazole  40 mg Oral Daily  . potassium chloride  20 mEq Oral Daily  . predniSONE  40 mg Oral Q breakfast  . QUEtiapine  150 mg Oral QHS  . risperiDONE  2 mg Oral QHS  . senna-docusate  1 tablet Oral BID  . venlafaxine XR  150 mg Oral QHS  . vitamin B-12  500 mcg Oral Daily   Continuous Infusions: . heparin 1,300 Units/hr (03/22/20 2335)   PRN Meds: albuterol, ALPRAZolam, ondansetron (ZOFRAN) IV   Vital Signs    Vitals:   03/22/20 1532 03/22/20 2106 03/23/20 0628 03/23/20 0816  BP: 112/71 118/74 101/82   Pulse: (!) 102 100 90   Resp: (!) 22 20 18    Temp:  98.2 F (36.8 C) 97.8 F (36.6 C)   TempSrc:  Oral Oral   SpO2: 94% 95% 97% 99%  Weight:   101.5 kg   Height:        Intake/Output Summary (Last 24 hours) at 03/23/2020 0827 Last data filed at 03/23/2020 0600 Gross per 24 hour  Intake 542.97 ml  Output 3900 ml  Net -3357.03 ml   Last 3 Weights 03/23/2020 03/22/2020 03/21/2020  Weight (lbs) 223 lb 12.3 oz 225 lb 12 oz 230 lb 9.6 oz  Weight (kg) 101.5 kg 102.4 kg 104.6 kg      Telemetry    SR - Personally Reviewed  ECG    No new tracing   Physical Exam   GEN: No acute distress.   Neck: No JVD Cardiac: RRR, no murmurs, rubs,  or gallops.  Respiratory: Clear to auscultation bilaterally. GI: Soft, nontender, non-distended  MS: No edema; No deformity. Neuro:  Nonfocal  Psych: Normal affect   Labs    High Sensitivity Troponin:   Recent Labs  Lab 03/20/20 2328 03/21/20 0128 03/21/20 0529 03/21/20 0735  TROPONINIHS 39* 50* 49* 41*      Chemistry Recent Labs  Lab 03/20/20 2150 03/22/20 0355  NA 141 138  K 4.9 4.6  CL 99 95*  CO2 32 35*  GLUCOSE 130* 143*  BUN 12 15  CREATININE 0.70 0.53  CALCIUM 9.9 10.5*  GFRNONAA >60 >60  ANIONGAP 10 8     Hematology Recent Labs  Lab 03/20/20 2234 03/22/20 0355  WBC 12.7* 12.8*  RBC 3.40* 3.44*  HGB 10.8* 11.0*  HCT 36.7 36.3  MCV 107.9* 105.5*  MCH 31.8 32.0  MCHC 29.4* 30.3  RDW 12.7 12.8  PLT 266 282    BNP Recent Labs  Lab 03/20/20 2327  BNP 76.8     DDimer No results for input(s): DDIMER in the last  168 hours.   Radiology    ECHOCARDIOGRAM COMPLETE  Result Date: 03/21/2020    ECHOCARDIOGRAM REPORT   Patient Name:   Julia Daniels Date of Exam: 03/21/2020 Medical Rec #:  [id number]           Height:       63.0 in Accession #:    [id number]          Weight:       230.6 lb Date of Birth:  1950-04-21          BSA:          2.054 m Patient Age:    71 years            BP:           120/84 mmHg Patient Gender: F                   HR:           95 bpm. Exam Location:  Inpatient Procedure: 2D Echo, Cardiac Doppler, Color Doppler and Intracardiac            Opacification Agent Indications:    I50.40* Unspecified combined systolic (congestive) and diastolic                 (congestive) heart failure  History:        Patient has prior history of Echocardiogram examinations, most                 recent 11/04/2019. CHF, COPD, Signs/Symptoms:Shortness of Breath                 and Dyspnea; Risk Factors:Current Smoker and Hypertension.                 Hypoxia.  Sonographer:    Roseanna Rainbow RDCS Referring Phys: [id number] ALEXIS HUGELMEYER  Sonographer  Comments: Technically difficult study due to poor echo windows, suboptimal parasternal window, suboptimal apical window, suboptimal subcostal window and patient is morbidly obese. Image acquisition challenging due to patient body habitus. Patient in high fowler's position. IMPRESSIONS  1. Mild hypokinesis of the mid to apical anteroseptum, inferoseptum and inferior myocardium. Left ventricular ejection fraction, by estimation, is 45 to 50%. The left ventricle has mildly decreased function. The left ventricle demonstrates regional wall  motion abnormalities (see scoring diagram/findings for description). Left ventricular diastolic parameters are consistent with Grade I diastolic dysfunction (impaired relaxation). Elevated left ventricular end-diastolic pressure.  2. Right ventricular systolic function is normal. The right ventricular size is normal. There is normal pulmonary artery systolic pressure.  3. The mitral valve is normal in structure. No evidence of mitral valve regurgitation. No evidence of mitral stenosis.  4. The aortic valve is normal in structure. Aortic valve regurgitation is not visualized. No aortic stenosis is present.  5. Aortic dilatation noted. There is mild dilatation of the ascending aorta, measuring 41 mm.  6. The inferior vena cava is dilated in size with >50% respiratory variability, suggesting right atrial pressure of 8 mmHg. FINDINGS  Left Ventricle: Mild hypokinesis of the mid to apical anteroseptum, inferoseptum and inferior myocardium. Left ventricular ejection fraction, by estimation, is 45 to 50%. The left ventricle has mildly decreased function. The left ventricle demonstrates regional wall motion abnormalities. Definity contrast agent was given IV to delineate the left ventricular endocardial borders. The left ventricular internal cavity size was normal in size. There is no left ventricular hypertrophy. Left ventricular diastolic parameters are consistent with Grade I  diastolic  dysfunction (impaired relaxation). Elevated left ventricular end-diastolic pressure. Right Ventricle: The right ventricular size is normal. No increase in right ventricular wall thickness. Right ventricular systolic function is normal. There is normal pulmonary artery systolic pressure. The tricuspid regurgitant velocity is 1.34 m/s, and  with an assumed right atrial pressure of 8 mmHg, the estimated right ventricular systolic pressure is 26.9 mmHg. Left Atrium: Left atrial size was normal in size. Right Atrium: Right atrial size was normal in size. Pericardium: There is no evidence of pericardial effusion. Mitral Valve: The mitral valve is normal in structure. No evidence of mitral valve regurgitation. No evidence of mitral valve stenosis. Tricuspid Valve: The tricuspid valve is normal in structure. Tricuspid valve regurgitation is trivial. No evidence of tricuspid stenosis. Aortic Valve: The aortic valve is normal in structure. Aortic valve regurgitation is not visualized. No aortic stenosis is present. Pulmonic Valve: The pulmonic valve was normal in structure. Pulmonic valve regurgitation is not visualized. No evidence of pulmonic stenosis. Aorta: Aortic dilatation noted. There is mild dilatation of the ascending aorta, measuring 41 mm. Venous: The inferior vena cava is dilated in size with greater than 50% respiratory variability, suggesting right atrial pressure of 8 mmHg. IAS/Shunts: No atrial level shunt detected by color flow Doppler.  LEFT VENTRICLE PLAX 2D LVIDd:         4.30 cm     Diastology LVIDs:         3.40 cm     LV e' medial:    4.90 cm/s LV PW:         1.30 cm     LV E/e' medial:  14.2 LV IVS:        1.40 cm     LV e' lateral:   8.92 cm/s LVOT diam:     2.10 cm     LV E/e' lateral: 7.8 LV SV:         75 LV SV Index:   37 LVOT Area:     3.46 cm  LV Volumes (MOD) LV vol d, MOD A2C: 76.8 ml LV vol d, MOD A4C: 63.9 ml LV vol s, MOD A2C: 36.7 ml LV vol s, MOD A4C: 27.1 ml LV SV MOD A2C:     40.1 ml  LV SV MOD A4C:     63.9 ml LV SV MOD BP:      38.5 ml RIGHT VENTRICLE             IVC RV S prime:     10.30 cm/s  IVC diam: 2.20 cm TAPSE (M-mode): 1.4 cm LEFT ATRIUM             Index       RIGHT ATRIUM           Index LA diam:        2.60 cm 1.27 cm/m  RA Area:     11.90 cm LA Vol (A2C):   25.5 ml 12.41 ml/m RA Volume:   29.50 ml  14.36 ml/m LA Vol (A4C):   17.7 ml 8.62 ml/m LA Biplane Vol: 22.6 ml 11.00 ml/m  AORTIC VALVE LVOT Vmax:   146.00 cm/s LVOT Vmean:  95.700 cm/s LVOT VTI:    0.217 m  AORTA Ao Root diam: 3.20 cm Ao Asc diam:  4.10 cm MITRAL VALVE                TRICUSPID VALVE MV Area (PHT): 3.17 cm     TR Peak grad:  7.2 mmHg MV Decel Time: 239 msec     TR Vmax:        134.00 cm/s MV E velocity: 69.80 cm/s MV A velocity: 111.00 cm/s  SHUNTS MV E/A ratio:  0.63         Systemic VTI:  0.22 m                             Systemic Diam: 2.10 cm Skeet Latch MD Electronically signed by Skeet Latch MD Signature Date/Time: 03/21/2020/1:29:30 PM    Final     Cardiac Studies   Echocardiogram 03/21/20: 1. Mild hypokinesis of the mid to apical anteroseptum, inferoseptum and  inferior myocardium. Left ventricular ejection fraction, by estimation, is  45 to 50%. The left ventricle has mildly decreased function. The left  ventricle demonstrates regional wall  motion abnormalities (see scoring diagram/findings for description). Left  ventricular diastolic parameters are consistent with Grade I diastolic  dysfunction (impaired relaxation). Elevated left ventricular end-diastolic  pressure.  2. Right ventricular systolic function is normal. The right ventricular  size is normal. There is normal pulmonary artery systolic pressure.  3. The mitral valve is normal in structure. No evidence of mitral valve  regurgitation. No evidence of mitral stenosis.  4. The aortic valve is normal in structure. Aortic valve regurgitation is  not visualized. No aortic stenosis is present.  5. Aortic  dilatation noted. There is mild dilatation of the ascending  aorta, measuring 41 mm.  6. The inferior vena cava is dilated in size with >50% respiratory  variability, suggesting right atrial pressure of 8 mmHg.   Patient Profile     70 y.o. female with a PMH of chronic diastolic CHF, HLD, COPD on home O2, anxiety, depression, GERD, and PE on Eliquis, who is being seen for the evaluation of acute combined CHF in setting of acute on chronic respiratory failure requiring BiPAP for O2 support.  Assessment & Plan    1. Acute combined CHF - Echo showed EF 45-50% with apical anteroseptum, inferoseptum, and inferior myocardium hypokinesis, new compared to 10/2019.  - Diuresed 4.7L so far. Weight down 230>>223lb.  - SOB is multifactorial - Recommended R & L cath (would be tomorrow after Eliquis hold for 3 doses)  given risk factors and focal RWMs on echo - Will review heart failure regiment post cath - Can consider bisoprolol for pulmonary stand point - Continue IV diuresis, hold tomorrow AM for cath   2. Hx of PE - Holding Eliquis for cath - on heparin  3. HLD - 03/21/2020: Cholesterol 213; HDL 73; LDL Cholesterol 131; Triglycerides 44; VLDL 9  - LDL goal less than 70 - Lipitor increased to 80mg  qd - Lipid panel and LFTS in 6-8 weeks  4. Acute on chronic respiratory failure 5. COPD exacerbation - Per primary team    For questions or updates, please contact [facility] Please consult [email or web address] for contact info under        SignedLeanor Kail, PA  03/23/2020, 8:27 AM

## 2020-03-23 NOTE — Plan of Care (Signed)
  Problem: Activity: Goal: Risk for activity intolerance will decrease Outcome: Progressing   

## 2020-03-23 NOTE — Progress Notes (Addendum)
[place] for heparin  Indication: pulmonary embolus- bridge therapy while apixaban on hold for cardiac cath  Allergies  Allergen Reactions  . Sulfa Antibiotics Other (See Comments)    Mouth gets raw    Patient Measurements: Height: 5\' 3"  (160 cm) Weight: 101.5 kg (223 lb 12.3 oz) IBW/kg (Calculated) : 52.4 Heparin Dosing Weight: 76.3 kg  Vital Signs: Temp: 98.9 F (37.2 C) (03/15 1409) Temp Source: Oral (03/15 1409) BP: 143/82 (03/15 1409) Pulse Rate: 101 (03/15 1409)  Labs: Recent Labs     0000 03/20/20 2150 03/20/20 2234 03/20/20 2328 03/21/20 0128 03/21/20 0529 03/21/20 0735 03/22/20 0355 03/22/20 1305 03/23/20 0828 03/23/20 1857  HGB   < >  --  10.8*  --   --   --   --  11.0*  --   --  12.6  HCT  --   --  36.7  --   --   --   --  36.3  --   --  40.6  PLT  --   --  266  --   --   --   --  282  --   --  332  APTT  --   --   --   --   --   --   --   --  28 31 41*  HEPARINUNFRC  --   --   --   --   --   --   --   --  >2.20* 0.74* 0.71*  CREATININE  --  0.70  --   --   --   --   --  0.53  --  0.79  --   TROPONINIHS  --   --   --    < > 50* 49* 41*  --   --   --   --    < > = values in this interval not displayed.    Estimated Creatinine Clearance: 77.6 mL/min (by C-G formula based on SCr of 0.79 mg/dL).   Assessment: 70 yo F on apixaban 5 mg PO BID for hx PE, LD 3/24 1042 am.  To start heparin per pharmacy for bridge therapy.  Patient to have left & right heart cath 3/16.   Baseline labs: Hgb 11,  Pltc WNL, SCr WNL aPTT 28 seconds, heparin level > 2.2 (falsely elevated secondary to recent apixaban)   Today, 03/23/20:  PM aPTT = 41 seconds, remains subtherapeutic despite bolus and rate increase earlier today  PM heparin level = 0.71 units/mL, remains falsely elevated due to recent apixaban   CBC: H/H, Pltc WNL  No bleeding or infusion issues noted per nursing  Goal of Therapy:  APTT 66 - 102 seconds Heparin  level 0.3-0.7 units/ml Monitor platelets by anticoagulation protocol: Yes   Plan:  Heparin bolus 2500 units IV x 1, then increase heparin infusion to 1850 units/hr Check aPTT, heparin level, and CBC in 8 hours  For R & L heart cath tomorrow- will f/u anticoag plans   Lindell Spar, PharmD, BCPS 03/23/2020 8:33 PM

## 2020-03-24 ENCOUNTER — Inpatient Hospital Stay (HOSPITAL_COMMUNITY)
Admission: EM | Disposition: A | Discharge status: Home Health | Payer: Self-pay | Source: Home / Self Care | Attending: Internal Medicine

## 2020-03-24 ENCOUNTER — Encounter (HOSPITAL_COMMUNITY): Payer: Self-pay | Admitting: Family Medicine

## 2020-03-24 DIAGNOSIS — I5031 Acute diastolic (congestive) heart failure: Secondary | ICD-10-CM

## 2020-03-24 HISTORY — PX: RIGHT/LEFT HEART CATH AND CORONARY ANGIOGRAPHY: CATH118266

## 2020-03-24 HISTORY — PX: ABDOMINAL AORTOGRAM: CATH118222

## 2020-03-24 LAB — POCT I-STAT 7, (LYTES, BLD GAS, ICA,H+H)
Acid-Base Excess: 12 mmol/L — ABNORMAL HIGH (ref 0.0–2.0)
Bicarbonate: 40.8 mmol/L — ABNORMAL HIGH (ref 20.0–28.0)
Calcium, Ion: 1.4 mmol/L (ref 1.15–1.40)
HCT: 38 % (ref 36.0–46.0)
Hemoglobin: 12.9 g/dL (ref 12.0–15.0)
O2 Saturation: 93 %
Potassium: 3.5 mmol/L (ref 3.5–5.1)
Sodium: 139 mmol/L (ref 135–145)
TCO2: 43 mmol/L — ABNORMAL HIGH (ref 22–32)
pCO2 arterial: 71.2 mmHg (ref 32.0–48.0)
pH, Arterial: 7.367 (ref 7.350–7.450)
pO2, Arterial: 75 mmHg — ABNORMAL LOW (ref 83.0–108.0)

## 2020-03-24 LAB — MRSA PCR SCREENING: MRSA by PCR: POSITIVE — AB

## 2020-03-24 LAB — POCT I-STAT EG7
Acid-Base Excess: 13 mmol/L — ABNORMAL HIGH (ref 0.0–2.0)
Acid-Base Excess: 14 mmol/L — ABNORMAL HIGH (ref 0.0–2.0)
Bicarbonate: 41.6 mmol/L — ABNORMAL HIGH (ref 20.0–28.0)
Bicarbonate: 42.6 mmol/L — ABNORMAL HIGH (ref 20.0–28.0)
Calcium, Ion: 1.37 mmol/L (ref 1.15–1.40)
Calcium, Ion: 1.39 mmol/L (ref 1.15–1.40)
HCT: 37 % (ref 36.0–46.0)
HCT: 37 % (ref 36.0–46.0)
Hemoglobin: 12.6 g/dL (ref 12.0–15.0)
Hemoglobin: 12.6 g/dL (ref 12.0–15.0)
O2 Saturation: 66 %
O2 Saturation: 68 %
Potassium: 3.5 mmol/L (ref 3.5–5.1)
Potassium: 3.6 mmol/L (ref 3.5–5.1)
Sodium: 139 mmol/L (ref 135–145)
Sodium: 139 mmol/L (ref 135–145)
TCO2: 44 mmol/L — ABNORMAL HIGH (ref 22–32)
TCO2: 45 mmol/L — ABNORMAL HIGH (ref 22–32)
pCO2, Ven: 74.8 mmHg (ref 44.0–60.0)
pCO2, Ven: 75.5 mmHg (ref 44.0–60.0)
pH, Ven: 7.354 (ref 7.250–7.430)
pH, Ven: 7.359 (ref 7.250–7.430)
pO2, Ven: 38 mmHg (ref 32.0–45.0)
pO2, Ven: 39 mmHg (ref 32.0–45.0)

## 2020-03-24 LAB — BASIC METABOLIC PANEL
Anion gap: 10 (ref 5–15)
BUN: 20 mg/dL (ref 8–23)
CO2: 35 mmol/L — ABNORMAL HIGH (ref 22–32)
Calcium: 10.4 mg/dL — ABNORMAL HIGH (ref 8.9–10.3)
Chloride: 94 mmol/L — ABNORMAL LOW (ref 98–111)
Creatinine, Ser: 0.82 mg/dL (ref 0.44–1.00)
GFR, Estimated: >60 mL/min (ref >60)
Glucose, Bld: 98 mg/dL (ref 70–99)
Potassium: 3.9 mmol/L (ref 3.5–5.1)
Sodium: 139 mmol/L (ref 135–145)

## 2020-03-24 LAB — CBC
HCT: 40.2 % (ref 36.0–46.0)
HCT: 39.6 % (ref 36.0–46.0)
Hemoglobin: 12.2 g/dL (ref 12.0–15.0)
Hemoglobin: 12.1 g/dL (ref 12.0–15.0)
MCH: 31.3 pg (ref 26.0–34.0)
MCH: 31.5 pg (ref 26.0–34.0)
MCHC: 30.3 g/dL (ref 30.0–36.0)
MCHC: 30.6 g/dL (ref 30.0–36.0)
MCV: 103.1 fL — ABNORMAL HIGH (ref 80.0–100.0)
MCV: 103.1 fL — ABNORMAL HIGH (ref 80.0–100.0)
Platelets: 296 10*3/uL (ref 150–400)
Platelets: 324 10*3/uL (ref 150–400)
RBC: 3.9 MIL/uL (ref 3.87–5.11)
RBC: 3.84 MIL/uL — ABNORMAL LOW (ref 3.87–5.11)
RDW: 12.8 % (ref 11.5–15.5)
RDW: 12.9 % (ref 11.5–15.5)
WBC: 10.8 10*3/uL — ABNORMAL HIGH (ref 4.0–10.5)
WBC: 10 10*3/uL (ref 4.0–10.5)
nRBC: 0 % (ref 0.0–0.2)
nRBC: 0 % (ref 0.0–0.2)

## 2020-03-24 LAB — CREATININE, SERUM
Creatinine, Ser: 0.82 mg/dL (ref 0.44–1.00)
GFR, Estimated: >60 mL/min (ref >60)

## 2020-03-24 LAB — HEPARIN LEVEL (UNFRACTIONATED): Heparin Unfractionated: 1.4 IU/mL — ABNORMAL HIGH (ref 0.30–0.70)

## 2020-03-24 LAB — APTT: aPTT: 124 seconds — ABNORMAL HIGH (ref 24–36)

## 2020-03-24 SURGERY — RIGHT/LEFT HEART CATH AND CORONARY ANGIOGRAPHY
Anesthesia: LOCAL

## 2020-03-24 MED ORDER — MUPIROCIN 2 % EX OINT
1.0000 "application " | TOPICAL_OINTMENT | Freq: Two times a day (BID) | CUTANEOUS | Status: DC
  Administered 2020-03-24 – 2020-03-27 (×7): 1 via NASAL
  Filled 2020-03-24 (×3): qty 22

## 2020-03-24 MED ORDER — SODIUM CHLORIDE 0.9% FLUSH
3.0000 mL | Freq: Two times a day (BID) | INTRAVENOUS | Status: DC
  Administered 2020-03-26 – 2020-03-31 (×6): 3 mL via INTRAVENOUS

## 2020-03-24 MED ORDER — SODIUM CHLORIDE 0.9% FLUSH: 3.0000 mL | INTRAVENOUS | Status: DC | PRN

## 2020-03-24 MED ORDER — VERAPAMIL HCL 2.5 MG/ML IV SOLN
INTRAVENOUS | Status: AC
  Filled 2020-03-24: qty 2

## 2020-03-24 MED ORDER — POLYVINYL ALCOHOL 1.4 % OP SOLN
1.0000 [drp] | OPHTHALMIC | Status: DC | PRN
  Administered 2020-03-24: 1 [drp] via OPHTHALMIC
  Filled 2020-03-24: qty 15

## 2020-03-24 MED ORDER — IOHEXOL 350 MG/ML SOLN
INTRAVENOUS | Status: DC | PRN
  Administered 2020-03-24: 125 mL

## 2020-03-24 MED ORDER — VERAPAMIL HCL 2.5 MG/ML IV SOLN
INTRAVENOUS | Status: DC | PRN
  Administered 2020-03-24: 10 mL via INTRA_ARTERIAL

## 2020-03-24 MED ORDER — SODIUM CHLORIDE 0.9 % IV SOLN: 250.0000 mL | INTRAVENOUS | Status: DC | PRN

## 2020-03-24 MED ORDER — ACETAMINOPHEN 325 MG PO TABS
650.0000 mg | ORAL_TABLET | ORAL | Status: DC | PRN
  Administered 2020-03-28 – 2020-03-31 (×2): 650 mg via ORAL
  Filled 2020-03-24 (×2): qty 2

## 2020-03-24 MED ORDER — SODIUM CHLORIDE 0.9 % IV SOLN: INTRAVENOUS | Status: DC

## 2020-03-24 MED ORDER — CHLORHEXIDINE GLUCONATE CLOTH 2 % EX PADS
6.0000 | MEDICATED_PAD | Freq: Every day | CUTANEOUS | Status: DC
  Administered 2020-03-26 – 2020-03-28 (×3): 6 via TOPICAL

## 2020-03-24 MED ORDER — ASPIRIN EC 81 MG PO TBEC
81.0000 mg | DELAYED_RELEASE_TABLET | Freq: Every day | ORAL | Status: DC
  Administered 2020-03-25 – 2020-04-01 (×8): 81 mg via ORAL
  Filled 2020-03-24 (×8): qty 1

## 2020-03-24 MED ORDER — LIDOCAINE HCL (PF) 1 % IJ SOLN
INTRAMUSCULAR | Status: AC
  Filled 2020-03-24: qty 30

## 2020-03-24 MED ORDER — NITROGLYCERIN 0.4 MG SL SUBL: 0.4000 mg | SUBLINGUAL_TABLET | SUBLINGUAL | Status: DC | PRN

## 2020-03-24 MED ORDER — HEPARIN SODIUM (PORCINE) 1000 UNIT/ML IJ SOLN
INTRAMUSCULAR | Status: AC
  Filled 2020-03-24: qty 1

## 2020-03-24 MED ORDER — HEPARIN (PORCINE) IN NACL 1000-0.9 UT/500ML-% IV SOLN
INTRAVENOUS | Status: DC | PRN
  Administered 2020-03-24 (×2): 500 mL

## 2020-03-24 MED ORDER — HEPARIN SODIUM (PORCINE) 5000 UNIT/ML IJ SOLN: 5000.0000 [IU] | Freq: Three times a day (TID) | INTRAMUSCULAR | Status: DC

## 2020-03-24 MED ORDER — LIDOCAINE HCL (PF) 1 % IJ SOLN
INTRAMUSCULAR | Status: DC | PRN
  Administered 2020-03-24: 15 mL
  Administered 2020-03-24 (×2): 5 mL

## 2020-03-24 MED ORDER — ASPIRIN 81 MG PO CHEW
81.0000 mg | CHEWABLE_TABLET | ORAL | Status: AC
  Administered 2020-03-24: 81 mg via ORAL
  Filled 2020-03-24: qty 1

## 2020-03-24 MED ORDER — HYDRALAZINE HCL 20 MG/ML IJ SOLN: 10.0000 mg | INTRAMUSCULAR | Status: AC | PRN

## 2020-03-24 MED ORDER — MIDAZOLAM HCL 2 MG/2ML IJ SOLN
INTRAMUSCULAR | Status: DC | PRN
  Administered 2020-03-24 (×2): 1 mg via INTRAVENOUS

## 2020-03-24 MED ORDER — FENTANYL CITRATE (PF) 100 MCG/2ML IJ SOLN
INTRAMUSCULAR | Status: AC
  Filled 2020-03-24: qty 2

## 2020-03-24 MED ORDER — ONDANSETRON HCL 4 MG/2ML IJ SOLN: 4.0000 mg | Freq: Four times a day (QID) | INTRAMUSCULAR | Status: DC | PRN

## 2020-03-24 MED ORDER — APIXABAN 5 MG PO TABS
5.0000 mg | ORAL_TABLET | Freq: Two times a day (BID) | ORAL | Status: DC
  Administered 2020-03-25 – 2020-04-01 (×15): 5 mg via ORAL
  Filled 2020-03-24 (×15): qty 1

## 2020-03-24 MED ORDER — ASPIRIN 81 MG PO CHEW: 81.0000 mg | CHEWABLE_TABLET | ORAL | Status: DC

## 2020-03-24 MED ORDER — HEPARIN (PORCINE) IN NACL 1000-0.9 UT/500ML-% IV SOLN
INTRAVENOUS | Status: AC
  Filled 2020-03-24: qty 1000

## 2020-03-24 MED ORDER — MIDAZOLAM HCL 2 MG/2ML IJ SOLN
INTRAMUSCULAR | Status: AC
  Filled 2020-03-24: qty 2

## 2020-03-24 MED ORDER — IPRATROPIUM-ALBUTEROL 0.5-2.5 (3) MG/3ML IN SOLN
3.0000 mL | Freq: Four times a day (QID) | RESPIRATORY_TRACT | Status: DC | PRN
  Administered 2020-03-25: 3 mL via RESPIRATORY_TRACT
  Filled 2020-03-24: qty 3

## 2020-03-24 MED ORDER — FENTANYL CITRATE (PF) 100 MCG/2ML IJ SOLN
INTRAMUSCULAR | Status: DC | PRN
  Administered 2020-03-24 (×2): 25 ug via INTRAVENOUS

## 2020-03-24 MED ORDER — LABETALOL HCL 5 MG/ML IV SOLN: 10.0000 mg | INTRAVENOUS | Status: AC | PRN

## 2020-03-24 SURGICAL SUPPLY — 19 items
CATH 5FR JL3.5 JR4 ANG PIG MP (CATHETERS) ×1 IMPLANT
CATH BALLN WEDGE 5F 110CM (CATHETERS) ×1 IMPLANT
CATH INFINITI 5FR JL4 (CATHETERS) ×1 IMPLANT
CATH INFINITI 5FR JL5 (CATHETERS) ×1 IMPLANT
DEVICE RAD COMP TR BAND LRG (VASCULAR PRODUCTS) ×1 IMPLANT
GLIDESHEATH SLEND SS 6F .021 (SHEATH) ×1 IMPLANT
GUIDEWIRE ANGLED .035X150CM (WIRE) ×1 IMPLANT
GUIDEWIRE INQWIRE 1.5J.035X260 (WIRE) IMPLANT
INQWIRE 1.5J .035X260CM (WIRE) ×2
KIT HEART LEFT (KITS) ×2 IMPLANT
PACK CARDIAC CATHETERIZATION (CUSTOM PROCEDURE TRAY) ×2 IMPLANT
SHEATH GLIDE SLENDER 4/5FR (SHEATH) ×1 IMPLANT
SHEATH PINNACLE 5F 10CM (SHEATH) ×1 IMPLANT
SHEATH PROBE COVER 6X72 (BAG) ×1 IMPLANT
SYR MEDRAD MARK 7 150ML (SYRINGE) ×2 IMPLANT
TRANSDUCER W/STOPCOCK (MISCELLANEOUS) ×2 IMPLANT
TUBING CIL FLEX 10 FLL-RA (TUBING) ×2 IMPLANT
WIRE EMERALD 3MM-J .035X150CM (WIRE) ×2 IMPLANT
WIRE HI TORQ VERSACORE-J 145CM (WIRE) ×1 IMPLANT

## 2020-03-24 NOTE — Progress Notes (Signed)
Patient called RN to room reporting acute onset intermittent  chest pain 7/10 radiating to left shoulder. RN performed EKG and notified PA Bhagat. PRN Nitroglycerine ordered. Patient's chest pain resolved, no longer requiring Nitro. Patient stable at this time. Will continue to monitor  VS: BP 152/73, HR 99, 95% on 2L nasal cannula

## 2020-03-24 NOTE — Progress Notes (Signed)
PT Cancellation Note  Patient Details Name: Julia Daniels MRN: [id number] DOB: 21-Aug-1950   Cancelled Treatment:    Reason Eval/Treat Not Completed: Patient at procedure or test/unavailable [facility] cath lab)   York Ram E 03/24/2020, 11:59 AM Arlyce Dice, DPT Acute Rehabilitation Services Pager: [phone] Office: [phone]

## 2020-03-24 NOTE — Interval H&P Note (Signed)
Cath Lab Visit (complete for each Cath Lab visit)  Clinical Evaluation Leading to the Procedure:   ACS: Yes.    Non-ACS:    Anginal Classification: CCS IV  Anti-ischemic medical therapy: Minimal Therapy (1 class of medications)  Non-Invasive Test Results: No non-invasive testing performed  Prior CABG: No previous CABG   Abnormal wall motion on echo   History and Physical Interval Note:  03/24/2020 10:18 AM  Julia Daniels  has presented today for surgery, with the diagnosis of chf.  The various methods of treatment have been discussed with the patient and family. After consideration of risks, benefits and other options for treatment, the patient has consented to  Procedure(s): RIGHT/LEFT HEART CATH AND CORONARY ANGIOGRAPHY (N/A) as a surgical intervention.  The patient's history has been reviewed, patient examined, no change in status, stable for surgery.  I have reviewed the patient's chart and labs.  Questions were answered to the patient's satisfaction.     Larae Grooms

## 2020-03-24 NOTE — Progress Notes (Signed)
OT Cancellation Note  Patient Details Name: Julia Daniels MRN: [id number] DOB: September 05, 1950   Cancelled Treatment:    Reason Eval/Treat Not Completed: Patient at procedure or test/ unavailable. Patient being transferred to [facility] for heart cath. Will f/u as able.  Alvino Lechuga L Yavuz Kirby 03/24/2020, 9:39 AM

## 2020-03-24 NOTE — Progress Notes (Signed)
Site area: rt groin fa sheath Site Prior to Removal:  Level 0 Pressure Applied For: 20 minutes Manual:   yes Patient Status During Pull:  stable Post Pull Site:  Level 0 Post Pull Instructions Given:  yes Post Pull Pulses Present: rt dp palpable Dressing Applied:  Gauze and tegaderm Bedrest begins @ 1220 Comments:

## 2020-03-24 NOTE — Plan of Care (Signed)
  Problem: Health Behavior/Discharge Planning: Goal: Ability to manage health-related needs will improve Outcome: Progressing   Problem: Clinical Measurements: Goal: Ability to maintain clinical measurements within normal limits will improve Outcome: Progressing   Problem: Clinical Measurements: Goal: Respiratory complications will improve Outcome: Progressing   Problem: Activity: Goal: Risk for activity intolerance will decrease Outcome: Progressing   Problem: Coping: Goal: Level of anxiety will decrease Outcome: Progressing   Problem: Pain Managment: Goal: General experience of comfort will improve Outcome: Progressing   Problem: Skin Integrity: Goal: Risk for impaired skin integrity will decrease Outcome: Progressing   Problem: Activity: Goal: Capacity to carry out activities will improve Outcome: Progressing

## 2020-03-24 NOTE — Progress Notes (Signed)
[name] for heparin  Indication: pulmonary embolus- bridge therapy while apixaban on hold for cardiac cath  Allergies  Allergen Reactions  . Sulfa Antibiotics Other (See Comments)    Mouth gets raw    Patient Measurements: Height: 5\' 3"  (160 cm) Weight: 101.5 kg (223 lb 12.3 oz) IBW/kg (Calculated) : 52.4 Heparin Dosing Weight: 76.3 kg  Vital Signs: Temp: 97.6 F (36.4 C) (03/16 0048) Temp Source: Oral (03/16 0048) BP: 143/88 (03/16 0048) Pulse Rate: 69 (03/16 0345)  Labs: Recent Labs     0000 03/21/20 0735 03/22/20 0355 03/22/20 1305 03/23/20 0828 03/23/20 1857 03/24/20 0405  HGB   < >  --  11.0*  --   --  12.6 12.2  HCT  --   --  36.3  --   --  40.6 40.2  PLT  --   --  282  --   --  332 296  APTT  --   --   --    < > 31 41* 124*  HEPARINUNFRC  --   --   --    < > 0.74* 0.71* 1.40*  CREATININE  --   --  0.53  --  0.79  --  0.82  TROPONINIHS  --  41*  --   --   --   --   --    < > = values in this interval not displayed.    Estimated Creatinine Clearance: 75.7 mL/min (by C-G formula based on SCr of 0.82 mg/dL).   Assessment: 70 yo F on apixaban 5 mg PO BID for hx PE, LD 3/24 1042 am.  To start heparin per pharmacy for bridge therapy.  Patient to have left & right heart cath 3/16.   Baseline labs: Hgb 11,  Pltc WNL, SCr WNL aPTT 28 seconds, heparin level > 2.2 (falsely elevated secondary to recent apixaban)   Today, 03/24/20:  aPTT = 124 seconds, now supra-therapeutic after bolus and rate increase   Heparin level = 1.4 units/mL, remains falsely elevated due to recent apixaban   CBC: H/H, Pltc WNL/stable  No bleeding or infusion issues noted per nursing  Goal of Therapy:  APTT 66 - 102 seconds Heparin level 0.3-0.7 units/ml Monitor platelets by anticoagulation protocol: Yes   Plan:  Decrease heparin infusion to 1700 units/hr For R & L heart cath at 1130a today- d/c heparin on call to cath lab F/U  anticoagulation plans post-op  Netta Cedars, PharmD, BCPS 03/24/2020 5:42 AM

## 2020-03-24 NOTE — Progress Notes (Signed)
Pt off unit at this time to go to [facility] via [facility] for planned heart cath. Pt stable at time of transfer.

## 2020-03-24 NOTE — Progress Notes (Signed)
PROGRESS NOTE    Julia Daniels  LPF:[id number] DOB: 1950/05/20 DOA: 03/20/2020 PCP: Simona Huh, NP    Brief Narrative:  Julia Daniels is a 70 year old female with past medical history significant for chronic respiratory failure/COPD on 2-3 L nasal cannula at baseline, anxiety, depression, chronic diastolic congestive heart failure, asthma, history of PE on anticoagulation with Eliquis, chronic back pain, hyperlipidemia, who presented to the ED with progressive shortness of breath.  Patient reports acute onset, 1 hour prior to ED arrival with general unwell feelings over the past week with no specific symptoms.  Patient was given epinephrine, albuterol, Atrovent, Solu-Medrol and magnesium and placed on CPAP by EMS and brought to the ED.  Hospital service consulted for further evaluation management of acute on chronic respiratory failure with hypoxia due to COPD and CHF exacerbation.   Assessment & Plan:   Active Problems:   Acute on chronic respiratory failure (HCC)   Shortness of breath   Acute pulmonary edema (HCC)   Acute on chronic respiratory failure with hypoxia/hypercarbia, POA Acute COPD exacerbation Acute on chronic combined systolic and diastolic congestive heart failure Patient presenting to ED with shortness of breath.  Chest x-ray suggestive of pulmonary edema.  BNP 76.8.  TTE with LVEF 45-50% with regional wall motion abnormalities (mild hypokinesis mid to apical anterior septum, inferior septum and inferior myocardium), grade 1 diastolic dysfunction.  Briefly required BiPAP on admission.  ABG with pH 7.37, PaCO2 73, PaO2 77. Follows with Pulmonology outpatient; [facility]. --PCCM and  cardiology following, appreciate assistance --Pending left and right heart catheterization today --Azithromycin 500 mg p.o. 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 --Cardiology following --Plan LHC/RHC today --Furosemide 40 mg IV q12h --Continue aspirin and statin --Strict I's and O's and daily weights  Pulmonary Embolism On Eliquis outpatient. --continue heparin drip  Hyperlipidemia: Atorvastatin 80 mg p.o. daily  GERD: Protonix 40 mg p.o. daily  Anxiety Depression --Risperdal 2 mg p.o. nightly --Effexor 150 mg p.o. nightly --Lamictal 200 mg p.o. nightly --Seroquel 150 mg p.o. nightly  Morbid obesity Body mass index is 38.56 kg/m.  Discussed with patient needs for aggressive lifestyle changes/weight loss as this complicates all facets of care.  Outpatient follow-up with PCP.      DVT prophylaxis: Heparin drip   Code Status: DNR Family Communication: Updated patient extensively at bedside, no family present this morning.  Disposition Plan:  Level of care: Progressive Status is: Inpatient  Remains inpatient appropriate because:Ongoing diagnostic testing needed not appropriate for outpatient work up, Unsafe d/c plan, IV treatments appropriate due to intensity of illness or inability to take PO and Inpatient level of care appropriate due to severity of illness   Dispo: The patient is from: Home              Anticipated d/c is to: Home              Patient currently is not medically stable to d/c.   Difficult to place patient No   Consultants:   PCCM  Cardiology  Procedures:   TTE  Antimicrobials:   Azithromycin   Subjective: Patient seen and examined bedside, resting comfortably.  States breathing improved.  Now on 2 L nasal  cannula which is her baseline.  Pending heart catheterization at [facility] later today.  Denies headache, no visual changes, no chest pain, palpitations, no abdominal pain, no weakness, no fatigue, no paresthesias.  No  acute events overnight per nursing staff.  Objective: Vitals:   03/24/20 0553 03/24/20 0621 03/24/20 0747 03/24/20 1031  BP:  (!) 143/88    Pulse:  100    Resp:  20    Temp:  99.4 F (37.4 C)    TempSrc:  Oral    SpO2:  100% 96% 97%  Weight: 98.7 kg     Height: 5\' 3"  (1.6 m)       Intake/Output Summary (Last 24 hours) at 03/24/2020 1053 Last data filed at 03/24/2020 0200 Gross per 24 hour  Intake 83.88 ml  Output 2500 ml  Net -2416.12 ml   Filed Weights   03/22/20 0738 03/23/20 0628 03/24/20 0553  Weight: 102.4 kg 101.5 kg 98.7 kg    Examination:  General exam: Appears calm and comfortable  Respiratory system: Clear to auscultation. Respiratory effort normal.  On 2 L nasal cannula (baseline 2-3L [place]) Cardiovascular system: S1 & S2 heard, RRR. No JVD, murmurs, rubs, gallops or clicks.  Trace lower extremity bilateral peripheral edema to mid shin. Gastrointestinal system: Abdomen is nondistended, soft and nontender. No organomegaly or masses felt. Normal bowel sounds heard. Central nervous system: Alert and oriented. No focal neurological deficits. Extremities: Symmetric 5 x 5 power. Skin: No rashes, lesions or ulcers Psychiatry: Judgement and insight appear normal. Mood & affect appropriate.     Data Reviewed: I have personally reviewed following labs and imaging studies  CBC: Recent Labs  Lab 03/20/20 2234 03/22/20 0355 03/23/20 1857 03/24/20 0405  WBC 12.7* 12.8* 10.4 10.8*  NEUTROABS 10.7*  --   --   --   HGB 10.8* 11.0* 12.6 12.2  HCT 36.7 36.3 40.6 40.2  MCV 107.9* 105.5* 102.5* 103.1*  PLT 266 282 332 254   Basic Metabolic Panel: Recent Labs  Lab 03/20/20 2150 03/22/20 0355 03/23/20 0828 03/24/20 0405  NA 141 138 139 139  K 4.9 4.6 3.5 3.9  CL 99 95* 94* 94*  CO2 32 35* 33* 35*  GLUCOSE 130* 143* 128* 98  BUN 12 15 27* 20  CREATININE 0.70 0.53 0.79 0.82  CALCIUM 9.9 10.5* 10.4* 10.4*   GFR: Estimated Creatinine Clearance: 74.5 mL/min (by C-G  formula based on SCr of 0.82 mg/dL). Liver Function Tests: No results for input(s): AST, ALT, ALKPHOS, BILITOT, PROT, ALBUMIN in the last 168 hours. No results for input(s): LIPASE, AMYLASE in the last 168 hours. No results for input(s): AMMONIA in the last 168 hours. Coagulation Profile: No results for input(s): INR, PROTIME in the last 168 hours. Cardiac Enzymes: No results for input(s): CKTOTAL, CKMB, CKMBINDEX, TROPONINI in the last 168 hours. BNP (last 3 results) No results for input(s): PROBNP in the last 8760 hours. HbA1C: Recent Labs    03/22/20 0355  HGBA1C 5.6   CBG: No results for input(s): GLUCAP in the last 168 hours. Lipid Profile: No results for input(s): CHOL, HDL, LDLCALC, TRIG, CHOLHDL, LDLDIRECT in the last 72 hours. Thyroid Function Tests: No results for input(s): TSH, T4TOTAL, FREET4, T3FREE, THYROIDAB in the last 72 hours. Anemia Panel: No results for input(s): VITAMINB12, FOLATE, FERRITIN, TIBC, IRON, RETICCTPCT in the last 72 hours. Sepsis Labs: No results for input(s): PROCALCITON, LATICACIDVEN in the last 168 hours.  Recent Results (from the past 240 hour(s))  Resp Panel  by RT-PCR (Flu A&B, Covid) Nasopharyngeal Swab     Status: None   Collection Time: 03/20/20  9:50 PM   Specimen: Nasopharyngeal Swab; Nasopharyngeal(NP) swabs in vial transport medium  Result Value Ref Range Status   SARS Coronavirus 2 by RT PCR NEGATIVE NEGATIVE Final    Comment: (NOTE) SARS-CoV-2 target nucleic acids are NOT DETECTED.  The SARS-CoV-2 RNA is generally detectable in upper respiratory specimens during the acute phase of infection. The lowest concentration of SARS-CoV-2 viral copies this assay can detect is 138 copies/mL. A negative result does not preclude SARS-Cov-2 infection and should not be used as the sole basis for treatment or other patient management decisions. A negative result may occur with  improper specimen collection/handling, submission of specimen  other than nasopharyngeal swab, presence of viral mutation(s) within the areas targeted by this assay, and inadequate number of viral copies(<138 copies/mL). A negative result must be combined with clinical observations, patient history, and epidemiological information. The expected result is Negative.  Fact Sheet for Patients:  EntrepreneurPulse.com.au  Fact Sheet for Healthcare Providers:  IncredibleEmployment.be  This test is no t yet approved or cleared by the [place] FDA and  has been authorized for detection and/or diagnosis of SARS-CoV-2 by FDA under an Emergency Use Authorization (EUA). This EUA will remain  in effect (meaning this test can be used) for the duration of the COVID-19 declaration under Section 564(b)(1) of the Act, 21 U.S.C.section 360bbb-3(b)(1), unless the authorization is terminated  or revoked sooner.       Influenza A by PCR NEGATIVE NEGATIVE Final   Influenza B by PCR NEGATIVE NEGATIVE Final    Comment: (NOTE) The Xpert Xpress SARS-CoV-2/FLU/RSV plus assay is intended as an aid in the diagnosis of influenza from Nasopharyngeal swab specimens and should not be used as a sole basis for treatment. Nasal washings and aspirates are unacceptable for Xpert Xpress SARS-CoV-2/FLU/RSV testing.  Fact Sheet for Patients: EntrepreneurPulse.com.au  Fact Sheet for Healthcare Providers: IncredibleEmployment.be  This test is not yet approved or cleared by the [place] FDA and has been authorized for detection and/or diagnosis of SARS-CoV-2 by FDA under an Emergency Use Authorization (EUA). This EUA will remain in effect (meaning this test can be used) for the duration of the COVID-19 declaration under Section 564(b)(1) of the Act, 21 U.S.C. section 360bbb-3(b)(1), unless the authorization is terminated or revoked.  Performed at [facility], [place] [address] [place], [place] [address]   MRSA PCR Screening     Status: Abnormal   Collection Time: 03/24/20  4:33 AM   Specimen: Nasopharyngeal  Result Value Ref Range Status   MRSA by PCR POSITIVE (A) NEGATIVE Final    Comment:        The GeneXpert MRSA Assay (FDA approved for NASAL specimens only), is one component of a comprehensive MRSA colonization surveillance program. It is not intended to diagnose MRSA infection nor to guide or monitor treatment for MRSA infections. RESULT CALLED TO, READ BACK BY AND VERIFIED WITH: IRIS RN AT [id number] ON 03/24/20 BY S.VANHOORNE Performed at [facility], [place] [address] [place], [place] [address]          Radiology Studies: No results found.      Scheduled Meds: . [MAR Hold] aspirin EC  81 mg Oral Daily  . [MAR Hold] atorvastatin  80 mg Oral Daily  . [MAR Hold] azithromycin  500 mg Oral Daily  . [MAR Hold] baclofen  10 mg Oral TID  . [  MAR Hold] budesonide  0.5 mg Nebulization BID  . [MAR Hold] chlorhexidine  15 mL Mouth Rinse BID  . [MAR Hold] Chlorhexidine Gluconate Cloth  6 each Topical Q0600  . [MAR Hold] famotidine  20 mg Oral QHS  . [MAR Hold] fluticasone furoate-vilanterol  1 puff Inhalation Daily  . [MAR Hold] ipratropium-albuterol  3 mL Nebulization TID  . [MAR Hold] lamoTRIgine  200 mg Oral QHS  . [MAR Hold] loratadine  10 mg Oral QPM  . [MAR Hold] mouth rinse  15 mL Mouth Rinse q12n4p  . [MAR Hold] mupirocin ointment  1 application Nasal BID  . [MAR Hold] pantoprazole  40 mg Oral Daily  . [MAR Hold] potassium chloride  20 mEq Oral Daily  . [MAR Hold] predniSONE  40 mg Oral Q breakfast  . [MAR Hold] QUEtiapine  150 mg Oral QHS  . [MAR Hold] risperiDONE  2 mg Oral QHS  . [MAR Hold] senna-docusate  1 tablet Oral BID  . [MAR Hold] sodium chloride flush  3 mL Intravenous Q12H  . [MAR Hold] venlafaxine XR  150 mg Oral QHS  . [MAR Hold] vitamin B-12  500 mcg Oral Daily   Continuous Infusions: . sodium  chloride    . sodium chloride    . heparin 1,700 Units/hr (03/24/20 0608)     LOS: 3 days    Time spent: 42 minutes spent on chart review, discussion with nursing staff, consultants, updating family and interview/physical exam; more than 50% of that time was spent in counseling and/or coordination of care.    Gearald Stonebraker J [place] DO [facility] Available via Epic secure chat 7am-7pm After these hours, please refer to coverage provider listed on [email or web address] 03/24/2020, 10:53 AM

## 2020-03-25 DIAGNOSIS — J9620 Acute and chronic respiratory failure, unspecified whether with hypoxia or hypercapnia: Secondary | ICD-10-CM

## 2020-03-25 DIAGNOSIS — R0600 Dyspnea, unspecified: Secondary | ICD-10-CM

## 2020-03-25 DIAGNOSIS — I5033 Acute on chronic diastolic (congestive) heart failure: Secondary | ICD-10-CM

## 2020-03-25 DIAGNOSIS — I420 Dilated cardiomyopathy: Secondary | ICD-10-CM

## 2020-03-25 LAB — BASIC METABOLIC PANEL
Anion gap: 6 (ref 5–15)
BUN: 16 mg/dL (ref 8–23)
CO2: 35 mmol/L — ABNORMAL HIGH (ref 22–32)
Calcium: 9.7 mg/dL (ref 8.9–10.3)
Chloride: 96 mmol/L — ABNORMAL LOW (ref 98–111)
Creatinine, Ser: 0.67 mg/dL (ref 0.44–1.00)
GFR, Estimated: >60 mL/min (ref >60)
Glucose, Bld: 118 mg/dL — ABNORMAL HIGH (ref 70–99)
Potassium: 4.1 mmol/L (ref 3.5–5.1)
Sodium: 137 mmol/L (ref 135–145)

## 2020-03-25 LAB — CBC
HCT: 36.2 % (ref 36.0–46.0)
Hemoglobin: 11.7 g/dL — ABNORMAL LOW (ref 12.0–15.0)
MCH: 32.8 pg (ref 26.0–34.0)
MCHC: 32.3 g/dL (ref 30.0–36.0)
MCV: 101.4 fL — ABNORMAL HIGH (ref 80.0–100.0)
Platelets: 306 10*3/uL (ref 150–400)
RBC: 3.57 MIL/uL — ABNORMAL LOW (ref 3.87–5.11)
RDW: 12.8 % (ref 11.5–15.5)
WBC: 10.3 10*3/uL (ref 4.0–10.5)
nRBC: 0 % (ref 0.0–0.2)

## 2020-03-25 MED ORDER — BACLOFEN 10 MG PO TABS
5.0000 mg | ORAL_TABLET | Freq: Three times a day (TID) | ORAL | Status: DC
  Administered 2020-03-25 – 2020-04-01 (×21): 5 mg via ORAL
  Filled 2020-03-25 (×21): qty 1

## 2020-03-25 MED ORDER — FUROSEMIDE 10 MG/ML IJ SOLN
20.0000 mg | Freq: Two times a day (BID) | INTRAMUSCULAR | Status: DC
  Administered 2020-03-25 – 2020-03-27 (×6): 20 mg via INTRAVENOUS
  Filled 2020-03-25 (×6): qty 2

## 2020-03-25 MED ORDER — ISOSORBIDE MONONITRATE ER 30 MG PO TB24
15.0000 mg | ORAL_TABLET | Freq: Every day | ORAL | Status: DC
  Administered 2020-03-25 – 2020-03-28 (×4): 15 mg via ORAL
  Filled 2020-03-25 (×4): qty 1

## 2020-03-25 MED FILL — Heparin Sodium (Porcine) Inj 1000 Unit/ML: INTRAMUSCULAR | Qty: 10 | Status: AC

## 2020-03-25 MED FILL — Lidocaine HCl Local Preservative Free (PF) Inj 1%: INTRAMUSCULAR | Qty: 30 | Status: AC

## 2020-03-25 NOTE — Progress Notes (Signed)
Went over QHS BIPAP with patient and family member. Explained mask and how to wear/adjust/take on and off/fix leaks. Hooking up oxygen. Tubing and if it disconnects. Patient and family member state they feel comfortable with BIPAP and patient states she will wear and practice placing mask on and taking off. Patient tolerated having the BIPAP mask on and settings. RT made patient and family member aware that if she had any questions later to please call.

## 2020-03-25 NOTE — Progress Notes (Signed)
Occupational Therapy Treatment Patient Details Name: Julia Daniels MRN: [id number] DOB: 1950/12/20 Today's Date: 03/25/2020    History of present illness Kaylany Tesoriero is a 70 y.o. female with a known history of anxiety, asthma, COPD on 2 L O2 at home, chronic diastolic CHF, chronic back pain presents to the emergency department for evaluation of shortness of breath. Admitted for acute on chronic respiratory failure 2/2 COPD and CHF exacerbation.   OT comments  Focus of session on level 2 theraband exercises and seated grooming activities. Pt on 2L 02 with stable VS.  Follow Up Recommendations  Home health OT    Equipment Recommendations  None recommended by OT    Recommendations for Other Services      Precautions / Restrictions Precautions Precautions: Fall Precaution Comments: chronic 2L O2       Mobility Bed Mobility Overal bed mobility: Needs Assistance Bed Mobility: Supine to Sit     Supine to sit: HOB elevated;Supervision     General bed mobility comments: with rail, pt reports she has whole rails on her hospital bed at home--educated pt on availability of half rails is she requests from [employer].    Transfers                 General transfer comment: pt continuously urinating having had IV lasix earlier, limited activity to EOB    Balance Overall balance assessment: Needs assistance Sitting-balance support: No upper extremity supported Sitting balance-Leahy Scale: Fair                                     ADL either performed or assessed with clinical judgement   ADL Overall ADL's : Needs assistance/impaired     Grooming: Oral care;Wash/dry hands;Wash/dry face;Sitting;Set up                                 General ADL Comments: Pt unable to wash her hair at home or get to her beauty shop. Would like help washing her hair while here.     Vision       Perception     Praxis      Cognition  Arousal/Alertness: Awake/alert Behavior During Therapy: WFL for tasks assessed/performed Overall Cognitive Status: Within Functional Limits for tasks assessed                                          Exercises Exercises: General Upper Extremity General Exercises - Upper Extremity Shoulder Flexion: Strengthening;Both;15 reps;Seated;Theraband Theraband Level (Shoulder Flexion): Level 2 (Red) Shoulder Horizontal ABduction: Strengthening;Both;15 reps;Seated;Theraband Theraband Level (Shoulder Horizontal Abduction): Level 2 (Red) Elbow Flexion: Strengthening;Both;15 reps;Seated;Theraband Theraband Level (Elbow Flexion): Level 2 (Red) Elbow Extension: Strengthening;Both;15 reps;Seated;Theraband Theraband Level (Elbow Extension): Level 2 (Red)   Shoulder Instructions       General Comments      Pertinent Vitals/ Pain       Pain Assessment: No/denies pain  Home Living                                          Prior Functioning/Environment  Frequency  Min 2X/week        Progress Toward Goals  OT Goals(current goals can now be found in the care plan section)  Progress towards OT goals: Progressing toward goals  Acute Rehab OT Goals Patient Stated Goal: breathe better OT Goal Formulation: With patient Time For Goal Achievement: 04/04/20 Potential to Achieve Goals: [place] Discharge plan remains appropriate    Co-evaluation                 AM-PAC OT "6 Clicks" Daily Activity     Outcome Measure   Help from another person eating meals?: None Help from another person taking care of personal grooming?: A Little Help from another person toileting, which includes using toliet, bedpan, or urinal?: A Lot Help from another person bathing (including washing, rinsing, drying)?: A Lot Help from another person to put on and taking off regular upper body clothing?: A Little Help from another person to put on and taking  off regular lower body clothing?: Total 6 Click Score: 15    End of Session    OT Visit Diagnosis: Unsteadiness on feet (R26.81);Muscle weakness (generalized) (M62.81)   Activity Tolerance Patient tolerated treatment well   Patient Left in bed;with call bell/phone within reach;with nursing/sitter in room   Nurse Communication          Time: 9476-5465 OT Time Calculation (min): 27 min  Charges: OT General Charges $OT Visit: 1 Visit OT Treatments $Self Care/Home Management : 8-22 mins $Therapeutic Exercise: 8-22 mins  Nestor Lewandowsky, OTR/L Acute Rehabilitation Services Pager: [phone] Office: [phone]    Malka So 03/25/2020, 2:27 PM

## 2020-03-25 NOTE — Progress Notes (Signed)
PROGRESS NOTE    Julia Daniels  UVO:[id number] DOB: [birth year] DOA: 03/20/2020 PCP: Simona Huh, NP    Brief Narrative:  FAVOUR ALESHIRE is a 70 year old female with past medical history significant for chronic respiratory failure/COPD on 2-3 L nasal cannula at baseline, anxiety, depression, chronic diastolic congestive heart failure, asthma, history of PE on anticoagulation with Eliquis, chronic back pain, hyperlipidemia, who presented to the ED with progressive shortness of breath.  Patient reports acute onset, 1 hour prior to ED arrival with general unwell feelings over the past week with no specific symptoms.  Patient was given epinephrine, albuterol, Atrovent, Solu-Medrol and magnesium and placed on CPAP by EMS and brought to the ED.  Hospital service consulted for further evaluation management of acute on chronic respiratory failure with hypoxia due to COPD and CHF exacerbation.   Assessment & Plan:   Acute on chronic respiratory failure with hypoxia/hypercarbia, POA Chronic respiratory failure/COPD on 3 L home O2 Acute on chronic combined systolic and diastolic congestive heart failure -Briefly required BiPAP on admission,  -clinically improving with diuresis, she is -5.9 L -TTE with LVEF 45-50% with regional wall motion abnormalities -Appreciate cardiology input, right and left heart cath yesterday noted elevated blood pressure 22, 60% left main and 70% LAD lesion, not felt to be appropriate for PCI and poor CABG candidate, medical management recommended -Continue IV Lasix today, could add low-dose Bystolic or Coreg, will defer to cardiology -Ambulate, out of bed to chair -Continue BiPAP nightly, PCCM recommended trilogy vent at discharge  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 -As noted above, continue IV Lasix  Chronic respiratory failure -On 3 L O2 at baseline -Continue nebs  Pulmonary Embolism On Eliquis  outpatient. -Off heparin, resume Eliquis  Hyperlipidemia: Atorvastatin 80 mg p.o. daily  GERD: Protonix 40 mg p.o. daily  Anxiety Depression -Continued on home regimen of Risperdal, Effexor, Lamictal, Seroquel  Morbid obesity Body mass index is 39.68 kg/m.  Discussed with patient needs for aggressive lifestyle changes/weight loss as this complicates all facets of care.  Outpatient follow-up with PCP.      DVT prophylaxis: Eliquis   Code Status: DNR Family Communication: Discussed with patient in detail, no family at bedside  Disposition Plan:  Level of care: Progressive Cardiac Status is: Inpatient  Remains inpatient appropriate because: Severity of illness   Dispo: The patient is from: Home              Anticipated d/c is to: Home              Patient currently is not medically stable to d/c.   Difficult to place patient No   Consultants:   PCCM  Cardiology  Procedures:   TTE  Antimicrobials:   Azithromycin   Subjective: -Feels like she is improving, some shortness of breath overnight and chest pain which relieved after nitroglycerin Objective: Vitals:   03/24/20 2202 03/25/20 0023 03/25/20 0655 03/25/20 0753  BP:  115/87 (!) 119/53 100/71  Pulse:  (!) 103 89 86  Resp:  20 (!) 21 20  Temp: 99.4 F (37.4 C) 98.5 F (36.9 C) 98.4 F (36.9 C) 98.3 F (36.8 C)  TempSrc: Oral Oral Oral Oral  SpO2:  90% 97%   Weight:   101.6 kg   Height:        Intake/Output Summary (Last 24 hours) at 03/25/2020 1040 Last data filed at 03/25/2020 0937 Gross per 24 hour  Intake 1973.37 ml  Output 350 ml  Net 1623.37 ml   Filed Weights   03/23/20 0628 03/24/20 0553 03/25/20 0655  Weight: 101.5 kg 98.7 kg 101.6 kg    Examination:  General exam: Pleasant elderly female sitting up in bed, AAOx3 HEENT: Neck obese unable to assess JVD CVS: S1-S2, regular rate rhythm Lungs: Decreased breath sounds at the bases otherwise clear, poor air movement  bilaterally Abdomen: Soft, nontender, bowel sounds present,  extremities: No edema Psychiatry:  Mood & affect appropriate.     Data Reviewed: I have personally reviewed following labs and imaging studies  CBC: Recent Labs  Lab 03/20/20 2234 03/22/20 0355 03/23/20 1857 03/24/20 0405 03/24/20 1044 03/24/20 1049 03/24/20 1050 03/24/20 1413 03/25/20 0252  WBC 12.7* 12.8* 10.4 10.8*  --   --   --  10.0 10.3  NEUTROABS 10.7*  --   --   --   --   --   --   --   --   HGB 10.8* 11.0* 12.6 12.2 12.9 12.6 12.6 12.1 11.7*  HCT 36.7 36.3 40.6 40.2 38.0 37.0 37.0 39.6 36.2  MCV 107.9* 105.5* 102.5* 103.1*  --   --   --  103.1* 101.4*  PLT 266 282 332 296  --   --   --  324 403   Basic Metabolic Panel: Recent Labs  Lab 03/20/20 2150 03/22/20 0355 03/23/20 0828 03/24/20 0405 03/24/20 1044 03/24/20 1049 03/24/20 1050 03/24/20 1413 03/25/20 0252  NA 141 138 139 139 139 139 139  --  137  K 4.9 4.6 3.5 3.9 3.5 3.6 3.5  --  4.1  CL 99 95* 94* 94*  --   --   --   --  96*  CO2 32 35* 33* 35*  --   --   --   --  35*  GLUCOSE 130* 143* 128* 98  --   --   --   --  118*  BUN 12 15 27* 20  --   --   --   --  16  CREATININE 0.70 0.53 0.79 0.82  --   --   --  0.82 0.67  CALCIUM 9.9 10.5* 10.4* 10.4*  --   --   --   --  9.7   GFR: Estimated Creatinine Clearance: 77.7 mL/min (by C-G formula based on SCr of 0.67 mg/dL). Liver Function Tests: No results for input(s): AST, ALT, ALKPHOS, BILITOT, PROT, ALBUMIN in the last 168 hours. No results for input(s): LIPASE, AMYLASE in the last 168 hours. No results for input(s): AMMONIA in the last 168 hours. Coagulation Profile: No results for input(s): INR, PROTIME in the last 168 hours. Cardiac Enzymes: No results for input(s): CKTOTAL, CKMB, CKMBINDEX, TROPONINI in the last 168 hours. BNP (last 3 results) No results for input(s): PROBNP in the last 8760 hours. HbA1C: No results for input(s): HGBA1C in the last 72 hours. CBG: No results for  input(s): GLUCAP in the last 168 hours. Lipid Profile: No results for input(s): CHOL, HDL, LDLCALC, TRIG, CHOLHDL, LDLDIRECT in the last 72 hours. Thyroid Function Tests: No results for input(s): TSH, T4TOTAL, FREET4, T3FREE, THYROIDAB in the last 72 hours. Anemia Panel: No results for input(s): VITAMINB12, FOLATE, FERRITIN, TIBC, IRON, RETICCTPCT in the last 72 hours. Sepsis Labs: No results for input(s): PROCALCITON, LATICACIDVEN in the last 168 hours.  Recent Results (from the past 240 hour(s))  Resp Panel by RT-PCR (Flu A&B, Covid) Nasopharyngeal Swab     Status: None   Collection  Time: 03/20/20  9:50 PM   Specimen: Nasopharyngeal Swab; Nasopharyngeal(NP) swabs in vial transport medium  Result Value Ref Range Status   SARS Coronavirus 2 by RT PCR NEGATIVE NEGATIVE Final    Comment: (NOTE) SARS-CoV-2 target nucleic acids are NOT DETECTED.  The SARS-CoV-2 RNA is generally detectable in upper respiratory specimens during the acute phase of infection. The lowest concentration of SARS-CoV-2 viral copies this assay can detect is 138 copies/mL. A negative result does not preclude SARS-Cov-2 infection and should not be used as the sole basis for treatment or other patient management decisions. A negative result may occur with  improper specimen collection/handling, submission of specimen other than nasopharyngeal swab, presence of viral mutation(s) within the areas targeted by this assay, and inadequate number of viral copies(<138 copies/mL). A negative result must be combined with clinical observations, patient history, and epidemiological information. The expected result is Negative.  Fact Sheet for Patients:  EntrepreneurPulse.com.au  Fact Sheet for Healthcare Providers:  IncredibleEmployment.be  This test is no t yet approved or cleared by the [place] FDA and  has been authorized for detection and/or diagnosis of SARS-CoV-2 by FDA under  an Emergency Use Authorization (EUA). This EUA will remain  in effect (meaning this test can be used) for the duration of the COVID-19 declaration under Section 564(b)(1) of the Act, 21 U.S.C.section 360bbb-3(b)(1), unless the authorization is terminated  or revoked sooner.       Influenza A by PCR NEGATIVE NEGATIVE Final   Influenza B by PCR NEGATIVE NEGATIVE Final    Comment: (NOTE) The Xpert Xpress SARS-CoV-2/FLU/RSV plus assay is intended as an aid in the diagnosis of influenza from Nasopharyngeal swab specimens and should not be used as a sole basis for treatment. Nasal washings and aspirates are unacceptable for Xpert Xpress SARS-CoV-2/FLU/RSV testing.  Fact Sheet for Patients: EntrepreneurPulse.com.au  Fact Sheet for Healthcare Providers: IncredibleEmployment.be  This test is not yet approved or cleared by the [place] FDA and has been authorized for detection and/or diagnosis of SARS-CoV-2 by FDA under an Emergency Use Authorization (EUA). This EUA will remain in effect (meaning this test can be used) for the duration of the COVID-19 declaration under Section 564(b)(1) of the Act, 21 U.S.C. section 360bbb-3(b)(1), unless the authorization is terminated or revoked.  Performed at [facility], [place] [address] [place], [place] [address]   MRSA PCR Screening     Status: Abnormal   Collection Time: 03/24/20  4:33 AM   Specimen: Nasopharyngeal  Result Value Ref Range Status   MRSA by PCR POSITIVE (A) NEGATIVE Final    Comment:        The GeneXpert MRSA Assay (FDA approved for NASAL specimens only), is one component of a comprehensive MRSA colonization surveillance program. It is not intended to diagnose MRSA infection nor to guide or monitor treatment for MRSA infections. RESULT CALLED TO, READ BACK BY AND VERIFIED WITH: IRIS RN AT [phone] ON 03/24/20 BY S.VANHOORNE Performed at [facility],  [place] [address] [place], [place] [address]          Radiology Studies: CARDIAC CATHETERIZATION  Result Date: 03/24/2020  Ost LM to Mid LM lesion is 60% stenosed.  Mid LAD lesion is 70% stenosed.  The left ventricular systolic function is normal.  The left ventricular ejection fraction is 50-55% by visual estimate.  There is no aortic valve stenosis.  LV end diastolic pressure is moderately elevated.  Hemodynamic findings consistent with mild pulmonary hypertension. Ao  sat 93%, PA sat 67%, PA pressure 36/26, mean PA pressure 30 mm Hg, mean PCWP 22 mm Hg; CO 6.27 L/min, CI 3.08  Severe lesion in the right ostial common iliac artery with a 25 mm Hg pressure gradient. AAA present.  Severe, calcific left main disease.  I do not think she will be a candidate for CABG.  Would be high risk PCI and would not pursue this given her lack of angina. Severe PAD as well which limit support options.  Still volume overloaded.  Would continue diuresis and intensify medical therapy for CAD.  Discussed with the son and with Dr. Radford Pax.   PERIPHERAL VASCULAR CATHETERIZATION  Result Date: 03/24/2020  Ost LM to Mid LM lesion is 60% stenosed.  Mid LAD lesion is 70% stenosed.  The left ventricular systolic function is normal.  The left ventricular ejection fraction is 50-55% by visual estimate.  There is no aortic valve stenosis.  LV end diastolic pressure is moderately elevated.  Hemodynamic findings consistent with mild pulmonary hypertension. Ao sat 93%, PA sat 67%, PA pressure 36/26, mean PA pressure 30 mm Hg, mean PCWP 22 mm Hg; CO 6.27 L/min, CI 3.08  Severe lesion in the right ostial common iliac artery with a 25 mm Hg pressure gradient. AAA present.  Severe, calcific left main disease.  I do not think she will be a candidate for CABG.  Would be high risk PCI and would not pursue this given her lack of angina. Severe PAD as well which limit support options.  Still volume overloaded.  Would continue  diuresis and intensify medical therapy for CAD.  Discussed with the son and with Dr. Radford Pax.        Scheduled Meds: . apixaban  5 mg Oral BID  . aspirin EC  81 mg Oral Daily  . atorvastatin  80 mg Oral Daily  . azithromycin  500 mg Oral Daily  . baclofen  10 mg Oral TID  . budesonide  0.5 mg Nebulization BID  . chlorhexidine  15 mL Mouth Rinse BID  . Chlorhexidine Gluconate Cloth  6 each Topical Q0600  . famotidine  20 mg Oral QHS  . fluticasone furoate-vilanterol  1 puff Inhalation Daily  . furosemide  20 mg Intravenous BID  . lamoTRIgine  200 mg Oral QHS  . loratadine  10 mg Oral QPM  . mouth rinse  15 mL Mouth Rinse q12n4p  . mupirocin ointment  1 application Nasal BID  . pantoprazole  40 mg Oral Daily  . potassium chloride  20 mEq Oral Daily  . predniSONE  40 mg Oral Q breakfast  . QUEtiapine  150 mg Oral QHS  . risperiDONE  2 mg Oral QHS  . senna-docusate  1 tablet Oral BID  . sodium chloride flush  3 mL Intravenous Q12H  . sodium chloride flush  3 mL Intravenous Q12H  . venlafaxine XR  150 mg Oral QHS  . vitamin B-12  500 mcg Oral Daily   Continuous Infusions: . sodium chloride 10 mL/hr at 03/24/20 1153     LOS: 4 days    Time spent: 11min   Domenic Polite, MD [facility]  03/25/2020, 10:40 AM

## 2020-03-25 NOTE — Progress Notes (Addendum)
Dr. Radford Pax requested pulm consult to weigh in on pulm risk assessment for possible CABG - I spoke with their team to request. Appreciate their input.

## 2020-03-25 NOTE — Progress Notes (Signed)
Progress Note  Patient Name: Julia Daniels Date of Encounter: 03/25/2020  [facility] Cardiologist: Fransico Him, MD   Subjective   Had chest pain after cath 7/10 with radiation to left shoulder.  Relieved with SL NTG.  No further CP since then. EKG at the time showed NSR with nonspecific ST abnormality.  Cath yesterday with 60% LM and 70% mid LAD, elevated LVEDP/PCW and Severe lesion in the right ostial common iliac artery with a 25 mm Hg pressure gradient. AAA present.  Medical management recommended.    Inpatient Medications    Scheduled Meds: . apixaban  5 mg Oral BID  . aspirin EC  81 mg Oral Daily  . atorvastatin  80 mg Oral Daily  . azithromycin  500 mg Oral Daily  . baclofen  10 mg Oral TID  . budesonide  0.5 mg Nebulization BID  . chlorhexidine  15 mL Mouth Rinse BID  . Chlorhexidine Gluconate Cloth  6 each Topical Q0600  . famotidine  20 mg Oral QHS  . fluticasone furoate-vilanterol  1 puff Inhalation Daily  . furosemide  20 mg Intravenous BID  . lamoTRIgine  200 mg Oral QHS  . loratadine  10 mg Oral QPM  . mouth rinse  15 mL Mouth Rinse q12n4p  . mupirocin ointment  1 application Nasal BID  . pantoprazole  40 mg Oral Daily  . potassium chloride  20 mEq Oral Daily  . predniSONE  40 mg Oral Q breakfast  . QUEtiapine  150 mg Oral QHS  . risperiDONE  2 mg Oral QHS  . senna-docusate  1 tablet Oral BID  . sodium chloride flush  3 mL Intravenous Q12H  . sodium chloride flush  3 mL Intravenous Q12H  . venlafaxine XR  150 mg Oral QHS  . vitamin B-12  500 mcg Oral Daily   Continuous Infusions: . sodium chloride 10 mL/hr at 03/24/20 1153   PRN Meds: sodium chloride, acetaminophen, albuterol, ALPRAZolam, ipratropium-albuterol, nitroGLYCERIN, ondansetron (ZOFRAN) IV, polyvinyl alcohol, sodium chloride flush   Vital Signs    Vitals:   03/24/20 2202 03/25/20 0023 03/25/20 0655 03/25/20 0753  BP:  115/87 (!) 119/53 100/71  Pulse:  (!) 103 89 86  Resp:  20  (!) 21 20  Temp: 99.4 F (37.4 C) 98.5 F (36.9 C) 98.4 F (36.9 C) 98.3 F (36.8 C)  TempSrc: Oral Oral Oral Oral  SpO2:  90% 97%   Weight:   101.6 kg   Height:        Intake/Output Summary (Last 24 hours) at 03/25/2020 1031 Last data filed at 03/25/2020 1610 Gross per 24 hour  Intake 1973.37 ml  Output 350 ml  Net 1623.37 ml   Last 3 Weights 03/25/2020 03/24/2020 03/23/2020  Weight (lbs) 223 lb 15.8 oz 217 lb 11.2 oz 223 lb 12.3 oz  Weight (kg) 101.6 kg 98.748 kg 101.5 kg      Telemetry    NSR - Personally Reviewed  ECG    NSR with nonspecific T wave abnormality - Personally Reviewed  Physical Exam   GEN: No acute distress.   Neck: No JVD Cardiac: RRR with no M/R/G LUNG:  diffuse wheezes GI: Soft, nontender, non-distended  MS: No edema; No deformity. Neuro:  Nonfocal  Psych: Normal affect   Labs    High Sensitivity Troponin:   Recent Labs  Lab 03/20/20 2328 03/21/20 0128 03/21/20 0529 03/21/20 0735  TROPONINIHS 39* 50* 49* 41*      Chemistry Recent Labs  Lab 03/23/20 0828 03/24/20 0405 03/24/20 1044 03/24/20 1049 03/24/20 1050 03/24/20 1413 03/25/20 0252  NA 139 139   < > 139 139  --  137  K 3.5 3.9   < > 3.6 3.5  --  4.1  CL 94* 94*  --   --   --   --  96*  CO2 33* 35*  --   --   --   --  35*  GLUCOSE 128* 98  --   --   --   --  118*  BUN 27* 20  --   --   --   --  16  CREATININE 0.79 0.82  --   --   --  0.82 0.67  CALCIUM 10.4* 10.4*  --   --   --   --  9.7  GFRNONAA >60 >60  --   --   --  >60 >60  ANIONGAP 12 10  --   --   --   --  6   < > = values in this interval not displayed.     Hematology Recent Labs  Lab 03/24/20 0405 03/24/20 1044 03/24/20 1050 03/24/20 1413 03/25/20 0252  WBC 10.8*  --   --  10.0 10.3  RBC 3.90  --   --  3.84* 3.57*  HGB 12.2   < > 12.6 12.1 11.7*  HCT 40.2   < > 37.0 39.6 36.2  MCV 103.1*  --   --  103.1* 101.4*  MCH 31.3  --   --  31.5 32.8  MCHC 30.3  --   --  30.6 32.3  RDW 12.8  --   --  12.9  12.8  PLT 296  --   --  324 306   < > = values in this interval not displayed.    BNP Recent Labs  Lab 03/20/20 2327  BNP 76.8     DDimer No results for input(s): DDIMER in the last 168 hours.   Radiology    CARDIAC CATHETERIZATION  Result Date: 03/24/2020  Ost LM to Mid LM lesion is 60% stenosed.  Mid LAD lesion is 70% stenosed.  The left ventricular systolic function is normal.  The left ventricular ejection fraction is 50-55% by visual estimate.  There is no aortic valve stenosis.  LV end diastolic pressure is moderately elevated.  Hemodynamic findings consistent with mild pulmonary hypertension. Ao sat 93%, PA sat 67%, PA pressure 36/26, mean PA pressure 30 mm Hg, mean PCWP 22 mm Hg; CO 6.27 L/min, CI 3.08  Severe lesion in the right ostial common iliac artery with a 25 mm Hg pressure gradient. AAA present.  Severe, calcific left main disease.  I do not think she will be a candidate for CABG.  Would be high risk PCI and would not pursue this given her lack of angina. Severe PAD as well which limit support options.  Still volume overloaded.  Would continue diuresis and intensify medical therapy for CAD.  Discussed with the son and with Dr. Radford Pax.   PERIPHERAL VASCULAR CATHETERIZATION  Result Date: 03/24/2020  Ost LM to Mid LM lesion is 60% stenosed.  Mid LAD lesion is 70% stenosed.  The left ventricular systolic function is normal.  The left ventricular ejection fraction is 50-55% by visual estimate.  There is no aortic valve stenosis.  LV end diastolic pressure is moderately elevated.  Hemodynamic findings consistent with mild pulmonary hypertension. Ao sat 93%, PA sat 67%, PA pressure 36/26, mean  PA pressure 30 mm Hg, mean PCWP 22 mm Hg; CO 6.27 L/min, CI 3.08  Severe lesion in the right ostial common iliac artery with a 25 mm Hg pressure gradient. AAA present.  Severe, calcific left main disease.  I do not think she will be a candidate for CABG.  Would be high risk PCI  and would not pursue this given her lack of angina. Severe PAD as well which limit support options.  Still volume overloaded.  Would continue diuresis and intensify medical therapy for CAD.  Discussed with the son and with Dr. Radford Pax.    Cardiac Studies   2D echo  03/2020 IMPRESSIONS   1. Mild hypokinesis of the mid to apical anteroseptum, inferoseptum and  inferior myocardium. Left ventricular ejection fraction, by estimation, is  45 to 50%. The left ventricle has mildly decreased function. The left  ventricle demonstrates regional wall  motion abnormalities (see scoring diagram/findings for description). Left  ventricular diastolic parameters are consistent with Grade I diastolic  dysfunction (impaired relaxation). Elevated left ventricular end-diastolic  pressure.  2. Right ventricular systolic function is normal. The right ventricular  size is normal. There is normal pulmonary artery systolic pressure.  3. The mitral valve is normal in structure. No evidence of mitral valve  regurgitation. No evidence of mitral stenosis.  4. The aortic valve is normal in structure. Aortic valve regurgitation is  not visualized. No aortic stenosis is present.  5. Aortic dilatation noted. There is mild dilatation of the ascending  aorta, measuring 41 mm.  6. The inferior vena cava is dilated in size with >50% respiratory  variability, suggesting right atrial pressure of 8 mmHg.   Cardiac Cath 03/2020 Conclusion    Ost LM to Mid LM lesion is 60% stenosed.  Mid LAD lesion is 70% stenosed.  The left ventricular systolic function is normal.  The left ventricular ejection fraction is 50-55% by visual estimate.  There is no aortic valve stenosis.  LV end diastolic pressure is moderately elevated.  Hemodynamic findings consistent with mild pulmonary hypertension. Ao sat 93%, PA sat 67%, PA pressure 36/26, mean PA pressure 30 mm Hg, mean PCWP 22 mm Hg; CO 6.27 L/min, CI 3.08  Severe  lesion in the right ostial common iliac artery with a 25 mm Hg pressure gradient. AAA present.   Severe, calcific left main disease.  I do not think she will be a candidate for CABG.  Would be high risk PCI and would not pursue this given her lack of angina. Severe PAD as well which limit support options.  Still volume overloaded.  Would continue diuresis and intensify medical therapy for CAD.    Discussed with the son and with Dr. Radford Pax.   Coronary Diagrams   Diagnostic Dominance: Left   Patient Profile     70 y.o. female with a PMH of chronic diastolic CHF, HLD, COPD on home O2, anxiety, depression, GERD, and PE on Eliquis,who is being seen for the evaluation of acute combined CHF in setting of acute on chronic respiratory failure requiring BiPAP for O2 support.  Assessment & Plan    Acute combined systolic/diastolic CHF - Echo showed EF 45-50% with apical anteroseptum, inferoseptum, and inferior myocardium hypokinesis, new compared to 10/2019. Cath 50-55%.   - net neg 5.6L since admit but I&O's over last 24 hours incomplete - weights are inaccurate since admit - SOB is multifactorial (COPD/CHF/CAD) - right and left heart cath yesterday showed elevated PCWP at 61mmHg mean, LVEPD 8mmHg  -  SCr stable at 0.67 post cath  - will increase Lasix to 40mg  IV BID at least 1 more day - follow strict I&Os, daily weights and renal function while diuresing - consider addition of Farxiga   ASCAD -Cath yesterday with 60% ostial/proximal LM and 70% LAD -had chest pain post cath yesterday resolved with SL NTG -discussed case with Interventional cardiology.  She is likely not a CABG candidate due to severity of underlying lung disease.  -felt to be high risk PCI given LM as well as severe PAD which would limit support options for a LM PCI and medical management recommended at this time due to lack of angina -now had CP post cath yesterday which resolved with SL NTG -BP on soft side but will  try to add Imdur 15mg  daily -avoid BB due to COPD for now -continue ASA and high dose statin  Hx of PE - restarted apixaban 5mg  BID  HLD - 03/21/2020: Cholesterol 213; HDL 73; LDL Cholesterol 131; Triglycerides 44; VLDL 9  - LDL goal less than 70 - Lipitor increased to 80mg  qd - Lipid panel and LFTS in 6-8 weeks  Acute on chronic respiratory failure/COPD exacerbation - Per primary team  - will have pulmonary comment on degree of lung disease and risk for CABG  I have spent a total of 35 minutes with patient reviewing cardiac cath , telemetry, EKGs, labs and examining patient as well as establishing an assessment and plan that was discussed with the patient.  > 50% of time was spent in direct patient care.      For questions or updates, please contact [place] Please consult [email or web address] for contact info under        Signed, Fransico Him, MD  03/25/2020, 10:31 AM

## 2020-03-25 NOTE — Progress Notes (Signed)
   03/25/20 2301  BiPAP/CPAP/SIPAP  BiPAP/CPAP/SIPAP Pt Type Adult  Mask Type Full face mask  Mask Size Medium  IPAP 14 cmH20  EPAP 6 cmH2O  Oxygen Percent 36 %  BiPAP/CPAP/SIPAP BiPAP  Patient Home Equipment No  Auto Titrate No  BiPAP/CPAP /SiPAP Vitals  Pulse Rate (!) 104  Resp 18  MEWS Score/Color  MEWS Score 1  MEWS Score Color Green  Placed pt. On dreamstation machine

## 2020-03-25 NOTE — Progress Notes (Signed)
Physical Therapy Treatment Patient Details Name: Julia Daniels MRN: [id number] DOB: 1950-03-05 Today's Date: 03/25/2020    History of Present Illness Weslynn Ke is a 70 y.o. female with a known history of anxiety, asthma, COPD on 2 L O2 at home, chronic diastolic CHF, chronic back pain presents to the emergency department for evaluation of shortness of breath. Admitted for acute on chronic respiratory failure 2/2 COPD and CHF exacerbation.    PT Comments    Emphasis today on transitions to/from EOB, sit to standing and completing 4 standing exercises and 1 sitting exercise while monitoring SpO2 and EHR.    Follow Up Recommendations  Home health PT     Equipment Recommendations  None recommended by PT    Recommendations for Other Services       Precautions / Restrictions Precautions Precautions: Fall Precaution Comments: chronic 2L O2    Mobility  Bed Mobility Overal bed mobility: Needs Assistance Bed Mobility: Supine to Sit     Supine to sit: HOB elevated;Supervision     General bed mobility comments: with rail, pt reports she has whole rails on her hospital bed at home--educated pt on availability of half rails is she requests from [employer].    Transfers Overall transfer level: Needs assistance Equipment used: Rolling walker (2 wheeled) Transfers: Sit to/from Stand Sit to Stand: Min guard         General transfer comment: cue occasionally to use hands for safety on return to sitting otherwise no assist needed.  Ambulation/Gait                 Stairs             Wheelchair Mobility    Modified Rankin (Stroke Patients Only)       Balance Overall balance assessment: Needs assistance Sitting-balance support: No upper extremity supported Sitting balance-Leahy Scale: Fair     Standing balance support: Bilateral upper extremity supported Standing balance-Leahy Scale: Poor Standing balance comment: reliant on external support                             Cognition Arousal/Alertness: Awake/alert Behavior During Therapy: WFL for tasks assessed/performed Overall Cognitive Status: Within Functional Limits for tasks assessed                                        Exercises General Exercises - Upper Extremity Shoulder Flexion: Strengthening;Both;15 reps;Seated;Theraband Theraband Level (Shoulder Flexion): Level 2 (Red) Shoulder Horizontal ABduction: Strengthening;Both;15 reps;Seated;Theraband Theraband Level (Shoulder Horizontal Abduction): Level 2 (Red) Elbow Flexion: Strengthening;Both;15 reps;Seated;Theraband Theraband Level (Elbow Flexion): Level 2 (Red) Elbow Extension: Strengthening;Both;15 reps;Seated;Theraband Theraband Level (Elbow Extension): Level 2 (Red) General Exercises - Lower Extremity Long Arc Quad: AROM;Strengthening;Both;15 reps;Seated Hip ABduction/ADduction: AROM;Strengthening;Both;10 reps;Standing Hip Flexion/Marching: AROM;Strengthening;Both;10 reps;Standing Heel Raises: AROM;Strengthening;10 reps;Standing Mini-Sqauts: AROM;Strengthening;10 reps;Standing    General Comments General comments (skin integrity, edema, etc.): During standing activity, SpO2 ranged between 88-91% with lows of 85/86% before sitting and quick recovery  2-3 L [place]      Pertinent Vitals/Pain Pain Assessment: No/denies pain    Home Living                      Prior Function            PT Goals (current goals can now be found in the care plan section)  Acute Rehab PT Goals Patient Stated Goal: breathe better PT Goal Formulation: With patient Time For Goal Achievement: 04/04/20 Potential to Achieve Goals: Fair Progress towards PT goals: Progressing toward goals    Frequency    Min 3X/week      PT Plan Current plan remains appropriate    Co-evaluation              AM-PAC PT "6 Clicks" Mobility   Outcome Measure  Help needed turning from your back to your  side while in a flat bed without using bedrails?: A Little Help needed moving from lying on your back to sitting on the side of a flat bed without using bedrails?: A Little Help needed moving to and from a bed to a chair (including a wheelchair)?: A Little Help needed standing up from a chair using your arms (e.g., wheelchair or bedside chair)?: A Little Help needed to walk in hospital room?: A Little Help needed climbing 3-5 steps with a railing? : A Lot 6 Click Score: 17    End of Session Equipment Utilized During Treatment: Oxygen Activity Tolerance: Patient limited by fatigue Patient left: in bed;with call bell/phone within reach;with nursing/sitter in room Nurse Communication: Mobility status PT Visit Diagnosis: Muscle weakness (generalized) (M62.81);Difficulty in walking, not elsewhere classified (R26.2)     Time: 1550-1620 PT Time Calculation (min) (ACUTE ONLY): 30 min  Charges:  $Therapeutic Exercise: 8-22 mins $Therapeutic Activity: 8-22 mins                     03/25/2020  Ginger Carne., PT Acute Rehabilitation Services [phone]  (pager) [phone]  (office)   Tessie Fass Carine Nordgren 03/25/2020, 4:28 PM

## 2020-03-25 NOTE — Progress Notes (Signed)
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   Past Medical History:  Anxiety Asthma COPD Depression.    Significant Hospital Events:   5260  Consults:  PCCM  3/13   Procedures:  2d ech 3/13   Significant Diagnostic Tests:  Echo  3/13 >>>1. Mild hypokinesis of the mid to apical anteroseptum, inferoseptum and  inferior myocardium. Left ventricular ejection fraction, by estimation, is  45 to 50%. The left ventricle has mildly decreased function. The left ventricle demonstrates regional wall  motion abnormalities (see scoring diagram/findings for description). Left ventricular diastolic parameters are consistent with Grade I diastolic dysfunction (impaired relaxation). Elevated left ventricular end-diastolic pressure. 2. Right ventricular systolic function is normal.  Micro Data:  Resp viral panel 3/12 neg Sputum  >>>  Antimicrobials:  Zmax  3/13 >>>    Interim History / Subjective:  S/p cath yesterday with significant left main and LAD disease  Did not wear BiPAP overnight. She was transferred from [facility] and no one has showed her how. Reports shortness of breath  Objective   Blood pressure 100/71, pulse 86, temperature 98.3 F (36.8 C), temperature source Oral, resp. rate 20, height 5\' 3"  (1.6 m), weight 101.6 kg, SpO2  97 %.        Intake/Output Summary (Last 24 hours) at 03/25/2020 1142 Last data filed at 03/25/2020 1120 Gross per 24 hour  Intake 1973.37 ml  Output 1350 ml  Net 623.37 ml   Filed Weights   03/23/20 0628 03/24/20 0553 03/25/20 0655  Weight: 101.5 kg 98.7 kg 101.6 kg   Physical Exam: General: Chronically ill-appearing, no acute distress HENT: [place], AT, OP clear, MMM Eyes: EOMI, no scleral icterus Respiratory: Diminished breath sound bilaterally.  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  Previous ABG's have documented high PCO2.  Patient would benefit from non-invasive ventilation.  Without this therapy, the patient is at high risk of ending up with worsening symptoms, worsened respiratory failure, need for ER visits and/or recurrent hospitalizations.  Bilevel device unable to adequately support patient's nocturnal ventilation needs.  Patient would benefit from NIV therapy with set tidal volumes and pressure.  --Continue Breo and PRN Duonebs --Diuresis per Cardiology --DC Pulmicort --Start BiPAP. Will arrange for Trilogy prior to discharge  Acute systolic heart failure Coronary artery disease --Cath 3/16 with LAD 70% stenosis --Cardiology considering CABG. PCCM requested for  pre-op eval  Perioperative Assessment for Cardiac Surgery PCCM also consulted for Pulmonary pre-op assessment and recommendations to optimize patient's respiratory status. Calculated risk of mortality is  2.9% per STS criteria. Prolonged ventilation is predicted to be 17%. Risk of perioperative pulmonary complications (postoperative pneumonia, atelectasis, pulmonary embolism, ARDS and increased time requiring postoperative mechanical ventilation) is increased by his COPD, smoking and OSA. Overall, I recommend proceeding with the surgery if the risk for respiratory complications are outweighed by the potential benefits. This will need to be discussed between the patient and surgeon To reduce risks of respiratory complications: --Pre- and post-operative incentive spirometry performed frequently while awake --Inpatient use of auto-CPAP for OSA whenever the patient is sleeping --Avoiding use of pancuronium during anesthesia --OOB, encourage mobility post-op   Care Time: 35 Minutes.   Rodman Pickle, M.D. [facility] Pulmonary/Critical Care Medicine 03/25/2020 11:42 AM   Please see Amion for pager number to reach on-call Pulmonary and Critical Care Team.

## 2020-03-26 DIAGNOSIS — I2583 Coronary atherosclerosis due to lipid rich plaque: Secondary | ICD-10-CM

## 2020-03-26 DIAGNOSIS — I2510 Atherosclerotic heart disease of native coronary artery without angina pectoris: Secondary | ICD-10-CM

## 2020-03-26 LAB — BASIC METABOLIC PANEL
Anion gap: 9 (ref 5–15)
BUN: 16 mg/dL (ref 8–23)
CO2: 33 mmol/L — ABNORMAL HIGH (ref 22–32)
Calcium: 10.3 mg/dL (ref 8.9–10.3)
Chloride: 95 mmol/L — ABNORMAL LOW (ref 98–111)
Creatinine, Ser: 0.75 mg/dL (ref 0.44–1.00)
GFR, Estimated: >60 mL/min (ref >60)
Glucose, Bld: 103 mg/dL — ABNORMAL HIGH (ref 70–99)
Potassium: 4.9 mmol/L (ref 3.5–5.1)
Sodium: 137 mmol/L (ref 135–145)

## 2020-03-26 LAB — CBC
HCT: 36.7 % (ref 36.0–46.0)
Hemoglobin: 11.8 g/dL — ABNORMAL LOW (ref 12.0–15.0)
MCH: 32.3 pg (ref 26.0–34.0)
MCHC: 32.2 g/dL (ref 30.0–36.0)
MCV: 100.5 fL — ABNORMAL HIGH (ref 80.0–100.0)
Platelets: 317 10*3/uL (ref 150–400)
RBC: 3.65 MIL/uL — ABNORMAL LOW (ref 3.87–5.11)
RDW: 12.5 % (ref 11.5–15.5)
WBC: 12.3 10*3/uL — ABNORMAL HIGH (ref 4.0–10.5)
nRBC: 0 % (ref 0.0–0.2)

## 2020-03-26 NOTE — Progress Notes (Signed)
Physical Therapy Treatment Patient Details Name: Julia Daniels MRN: [id number] DOB: 05-21-1950 Today's Date: 03/26/2020    History of Present Illness Julia Daniels is a 70 y.o. female with a known history of anxiety, asthma, COPD on 2 L O2 at home, chronic diastolic CHF, chronic back pain presents to the emergency department for evaluation of shortness of breath. Admitted for acute on chronic respiratory failure 2/2 COPD and CHF exacerbation.    PT Comments    Patient progressing slowly towards PT goals.  Reports over-doing it yesterday with therapy. Reports breathing is improved today. Continues to require longer rest breaks between bouts of activity. Tolerated SPT with min guard assist and standing transfers with Min A progressing to Min guard due to posterior bias. SP02 remained >90% on 3L/min 02 Julia Daniels throughout session. Tolerated marching and STS exercises. No chest pain reported. Encouraged OOB to chair for all meals and transferring to [facility] as tolerated. Will follow.   Follow Up Recommendations  Home health PT     Equipment Recommendations  None recommended by PT    Recommendations for Other Services       Precautions / Restrictions Precautions Precautions: Fall Precaution Comments: chronic 2L O2 Restrictions Weight Bearing Restrictions: No    Mobility  Bed Mobility Overal bed mobility: Needs Assistance Bed Mobility: Supine to Sit     Supine to sit: HOB elevated;Supervision     General bed mobility comments: Use of rail, no assist needed. No dizziness.    Transfers Overall transfer level: Needs assistance Equipment used: Rolling walker (2 wheeled) Transfers: Sit to/from [employer] Sit to Stand: Min guard;Min assist Stand pivot transfers: Min guard       General transfer comment: Initially min A due to LOB posteriorly in standing, progressing to min guard assist. Stood from EOB x2, from chair x7, Cues for anterior weight shift. SPT bed  to chair with RW.  Ambulation/Gait                 Stairs             Wheelchair Mobility    Modified Rankin (Stroke Patients Only)       Balance Overall balance assessment: Needs assistance Sitting-balance support: Feet supported;Bilateral upper extremity supported Sitting balance-Leahy Scale: Fair Sitting balance - Comments: BUE support as position of comfort due to SOB.   Standing balance support: During functional activity Standing balance-Leahy Scale: Poor Standing balance comment: reliant on external support                            Cognition Arousal/Alertness: Awake/alert Behavior During Therapy: WFL for tasks assessed/performed Overall Cognitive Status: Within Functional Limits for tasks assessed                                        Exercises Other Exercises Other Exercises: STS x3 2 times with rest breaks in between bouts Other Exercises: Marching in standing x10 with RW    General Comments General comments (skin integrity, edema, etc.): Sp02 remained >90% on 3L/min 02 Julia Daniels. 2/4 DOE with activity.      Pertinent Vitals/Pain Pain Assessment: No/denies pain    Home Living                      Prior Function  PT Goals (current goals can now be found in the care plan section) Progress towards PT goals: Progressing toward goals    Frequency    Min 3X/week      PT Plan Current plan remains appropriate    Co-evaluation              AM-PAC PT "6 Clicks" Mobility   Outcome Measure  Help needed turning from your back to your side while in a flat bed without using bedrails?: A Little Help needed moving from lying on your back to sitting on the side of a flat bed without using bedrails?: A Little Help needed moving to and from a bed to a chair (including a wheelchair)?: A Little Help needed standing up from a chair using your arms (e.g., wheelchair or bedside chair)?: A Little Help  needed to walk in hospital room?: A Little Help needed climbing 3-5 steps with a railing? : A Lot 6 Click Score: 17    End of Session Equipment Utilized During Treatment: Oxygen Activity Tolerance: Patient tolerated treatment well Patient left: in chair;with call bell/phone within reach;with family/visitor present Nurse Communication: Mobility status;Other (comment) (purewick positioning) PT Visit Diagnosis: Muscle weakness (generalized) (M62.81);Difficulty in walking, not elsewhere classified (R26.2)     Time: 9753-0051 PT Time Calculation (min) (ACUTE ONLY): 30 min  Charges:  $Therapeutic Exercise: 8-22 mins $Therapeutic Activity: 8-22 mins                     Marisa Severin, PT, DPT Acute Rehabilitation Services Pager [phone] Office [place] 03/26/2020, 2:36 PM

## 2020-03-26 NOTE — Plan of Care (Signed)
Problem: Education: Goal: Knowledge of General Education information will improve Description: Including pain rating scale, medication(s)/side effects and non-pharmacologic comfort measures 03/26/2020 1118 by Wilder Glade, RN Outcome: Progressing 03/26/2020 1118 by Wilder Glade, RN Outcome: Progressing   Problem: Health Behavior/Discharge Planning: Goal: Ability to manage health-related needs will improve 03/26/2020 1118 by Wilder Glade, RN Outcome: Progressing 03/26/2020 1118 by Wilder Glade, RN Outcome: Progressing   Problem: Clinical Measurements: Goal: Ability to maintain clinical measurements within normal limits will improve 03/26/2020 1118 by Wilder Glade, RN Outcome: Progressing 03/26/2020 1118 by Wilder Glade, RN Outcome: Progressing Goal: Will remain free from infection 03/26/2020 1118 by Wilder Glade, RN Outcome: Progressing 03/26/2020 1118 by Wilder Glade, RN Outcome: Progressing Goal: Diagnostic test results will improve 03/26/2020 1118 by Wilder Glade, RN Outcome: Progressing 03/26/2020 1118 by Wilder Glade, RN Outcome: Progressing Goal: Respiratory complications will improve 03/26/2020 1118 by Wilder Glade, RN Outcome: Progressing 03/26/2020 1118 by Wilder Glade, RN Outcome: Progressing Goal: Cardiovascular complication will be avoided 03/26/2020 1118 by Wilder Glade, RN Outcome: Progressing 03/26/2020 1118 by Wilder Glade, RN Outcome: Progressing   Problem: Activity: Goal: Risk for activity intolerance will decrease 03/26/2020 1118 by Wilder Glade, RN Outcome: Progressing 03/26/2020 1118 by Wilder Glade, RN Outcome: Progressing   Problem: Nutrition: Goal: Adequate nutrition will be maintained 03/26/2020 1118 by Wilder Glade, RN Outcome: Progressing 03/26/2020 1118 by Wilder Glade, RN Outcome: Progressing   Problem: Coping: Goal: Level of anxiety will  decrease 03/26/2020 1118 by Wilder Glade, RN Outcome: Progressing 03/26/2020 1118 by Wilder Glade, RN Outcome: Progressing   Problem: Elimination: Goal: Will not experience complications related to bowel motility 03/26/2020 1118 by Wilder Glade, RN Outcome: Progressing 03/26/2020 1118 by Wilder Glade, RN Outcome: Progressing Goal: Will not experience complications related to urinary retention 03/26/2020 1118 by Wilder Glade, RN Outcome: Progressing 03/26/2020 1118 by Wilder Glade, RN Outcome: Progressing   Problem: Pain Managment: Goal: General experience of comfort will improve 03/26/2020 1118 by Wilder Glade, RN Outcome: Progressing 03/26/2020 1118 by Wilder Glade, RN Outcome: Progressing   Problem: Safety: Goal: Ability to remain free from injury will improve 03/26/2020 1118 by Wilder Glade, RN Outcome: Progressing 03/26/2020 1118 by Wilder Glade, RN Outcome: Progressing   Problem: Skin Integrity: Goal: Risk for impaired skin integrity will decrease 03/26/2020 1118 by Wilder Glade, RN Outcome: Progressing 03/26/2020 1118 by Wilder Glade, RN Outcome: Progressing   Problem: Education: Goal: Ability to demonstrate management of disease process will improve 03/26/2020 1118 by Wilder Glade, RN Outcome: Progressing 03/26/2020 1118 by Wilder Glade, RN Outcome: Progressing Goal: Ability to verbalize understanding of medication therapies will improve 03/26/2020 1118 by Wilder Glade, RN Outcome: Progressing 03/26/2020 1118 by Wilder Glade, RN Outcome: Progressing Goal: Individualized Educational Video(s) 03/26/2020 1118 by Wilder Glade, RN Outcome: Progressing 03/26/2020 1118 by Wilder Glade, RN Outcome: Progressing   Problem: Activity: Goal: Capacity to carry out activities will improve 03/26/2020 1118 by Wilder Glade, RN Outcome: Progressing 03/26/2020 1118 by Wilder Glade, RN Outcome: Progressing   Problem: Cardiac: Goal: Ability to achieve and maintain adequate cardiopulmonary perfusion will improve 03/26/2020 1118 by Wilder Glade, RN Outcome: Progressing 03/26/2020 1118 by Wilder Glade, RN Outcome: Progressing   Problem: Education: Goal: Knowledge of General Education information will improve Description: Including pain rating scale, medication(s)/side  effects and non-pharmacologic comfort measures 03/26/2020 1118 by Wilder Glade, RN Outcome: Progressing 03/26/2020 1118 by Wilder Glade, RN Outcome: Progressing   Problem: Health Behavior/Discharge Planning: Goal: Ability to manage health-related needs will improve 03/26/2020 1118 by Wilder Glade, RN Outcome: Progressing 03/26/2020 1118 by Wilder Glade, RN Outcome: Progressing   Problem: Clinical Measurements: Goal: Ability to maintain clinical measurements within normal limits will improve 03/26/2020 1118 by Wilder Glade, RN Outcome: Progressing 03/26/2020 1118 by Wilder Glade, RN Outcome: Progressing Goal: Will remain free from infection 03/26/2020 1118 by Wilder Glade, RN Outcome: Progressing 03/26/2020 1118 by Wilder Glade, RN Outcome: Progressing Goal: Diagnostic test results will improve 03/26/2020 1118 by Wilder Glade, RN Outcome: Progressing 03/26/2020 1118 by Wilder Glade, RN Outcome: Progressing Goal: Respiratory complications will improve 03/26/2020 1118 by Wilder Glade, RN Outcome: Progressing 03/26/2020 1118 by Wilder Glade, RN Outcome: Progressing Goal: Cardiovascular complication will be avoided 03/26/2020 1118 by Wilder Glade, RN Outcome: Progressing 03/26/2020 1118 by Wilder Glade, RN Outcome: Progressing   Problem: Activity: Goal: Risk for activity intolerance will decrease 03/26/2020 1118 by Wilder Glade, RN Outcome: Progressing 03/26/2020 1118 by Wilder Glade,  RN Outcome: Progressing   Problem: Nutrition: Goal: Adequate nutrition will be maintained 03/26/2020 1118 by Wilder Glade, RN Outcome: Progressing 03/26/2020 1118 by Wilder Glade, RN Outcome: Progressing   Problem: Coping: Goal: Level of anxiety will decrease 03/26/2020 1118 by Wilder Glade, RN Outcome: Progressing 03/26/2020 1118 by Wilder Glade, RN Outcome: Progressing   Problem: Elimination: Goal: Will not experience complications related to bowel motility 03/26/2020 1118 by Wilder Glade, RN Outcome: Progressing 03/26/2020 1118 by Wilder Glade, RN Outcome: Progressing Goal: Will not experience complications related to urinary retention 03/26/2020 1118 by Wilder Glade, RN Outcome: Progressing 03/26/2020 1118 by Wilder Glade, RN Outcome: Progressing   Problem: Pain Managment: Goal: General experience of comfort will improve 03/26/2020 1118 by Wilder Glade, RN Outcome: Progressing 03/26/2020 1118 by Wilder Glade, RN Outcome: Progressing   Problem: Safety: Goal: Ability to remain free from injury will improve 03/26/2020 1118 by Wilder Glade, RN Outcome: Progressing 03/26/2020 1118 by Wilder Glade, RN Outcome: Progressing   Problem: Skin Integrity: Goal: Risk for impaired skin integrity will decrease 03/26/2020 1118 by Wilder Glade, RN Outcome: Progressing 03/26/2020 1118 by Wilder Glade, RN Outcome: Progressing   Problem: Education: Goal: Understanding of CV disease, CV risk reduction, and recovery process will improve 03/26/2020 1118 by Wilder Glade, RN Outcome: Progressing 03/26/2020 1118 by Wilder Glade, RN Outcome: Progressing Goal: Individualized Educational Video(s) 03/26/2020 1118 by Wilder Glade, RN Outcome: Progressing 03/26/2020 1118 by Wilder Glade, RN Outcome: Progressing   Problem: Activity: Goal: Ability to return to baseline activity level will  improve 03/26/2020 1118 by Wilder Glade, RN Outcome: Progressing 03/26/2020 1118 by Wilder Glade, RN Outcome: Progressing   Problem: Cardiovascular: Goal: Ability to achieve and maintain adequate cardiovascular perfusion will improve 03/26/2020 1118 by Wilder Glade, RN Outcome: Progressing 03/26/2020 1118 by Wilder Glade, RN Outcome: Progressing Goal: Vascular access site(s) Level 0-1 will be maintained 03/26/2020 1118 by Wilder Glade, RN Outcome: Progressing 03/26/2020 1118 by Wilder Glade, RN Outcome: Progressing   Problem: Health Behavior/Discharge Planning: Goal: Ability to safely manage health-related needs after discharge will improve 03/26/2020 1118 by Wilder Glade, RN Outcome: Progressing 03/26/2020 1118 by Raphael Gibney,  Maren Reamer, RN Outcome: Progressing

## 2020-03-26 NOTE — Progress Notes (Signed)
Patient continues to exhibit signs of hypercapnia associated with chronic respiratory failure secondary to severe COPD.  Interruption or failure to provide NIV would quickly lead to exacerbation of the patient's condition, hospital admission, and likely harm to the patient. Continued use is preferred.  The use of the NIV will treat patient's high PC02 levels and can reduce risk of exacerbations and future hospitalizations when used at night and during the day.  BiLevel/RAD has been considered and ruled out as patient requires continuous alarms, backup battery, and portability which are not possible with BiLevel/RAD devices.  Ventilation is required to decrease the work of breathing and improve pulmonary status. Interruption of ventilator support would lead to decline of health status.  Patient is able to protect their airways and clear secretions on their own.  Domenic Polite MD

## 2020-03-26 NOTE — TOC Initial Note (Signed)
Transition of Care [facility]) - Initial/Assessment Note    Patient Details  Name: Julia Daniels MRN: [id number] Date of Birth: 1950-09-24  Transition of Care [facility]) CM/SW Contact:    Bethena Roys, RN Phone Number: 03/26/2020, 5:45 PM  Clinical Narrative:  Risk for readmission assessment completed. Patient presented for shortness of breath. Prior to arrival patient was from home and states that her son lives with her. Son works from 3 pm to 11:30 pm. Patient states she has durable medical equipment (DME) hospital bed, wheelchair, bedside commode, and a rolling walker. Patient is in need of a trilogy vent. [place] Liaison for [employer] is following the patient. Seems like the patient was working and had a Nature conservation officer- the insurance lapsed and she is without insurance at this time. Patient is working on getting COBRA paperwork completed. CSW assisted the patient with the Medicare application. Case Manager will speak with the Lead/ Director on Monday to see if the patient id authorized to receive a LEtter of Guarantee (LOG) for the Trilogy until her COBRA is approved. MD has filled out the NIV/Trilogy paperwork. Case Manager discussed home health services with the patient and she is in need of physical therapy and home health nurse for disease management-patient is agreeable to services. [facility] used [facility]) in Feb and she wants to use them again for RN and PT services. Case Manager called [place] Liaison for [facility] to see if the patient qualifies for charity. Liaison will follow up with the Case Manager over the weekend regarding services.                Expected Discharge Plan: [place] Barriers to Discharge: Continued Medical Work up   Patient Goals and CMS Choice Patient states their goals for this hospitalization and ongoing recovery are:: to return home with family support   Choice offered to / list presented to : NA (Pt is  without insurance-charity care via [facility].)  Expected Discharge Plan and Services Expected Discharge Plan: [place] In-house Referral: Clinical Social Work Discharge Planning Services: CM Consult Post Acute Care Choice: [place] Living arrangements for the past 2 months: Single Family Home                 DME Arranged: Ventilator (Trilogy Vent) DME Agency: [employer] Date DME Agency Contacted: 03/26/20 Time DME Agency Contacted: [phone] Representative spoke with at DME Agency: [place]: RN,Disease Management [place] Agency: [facility] [employer]) (checking to see if she can get charity care.) Date [place]: 03/26/20 Time [place]: 1700 Representative spoke with at [place]: [place]  Prior Living Arrangements/Services Living arrangements for the past 2 months: [place] with:: [place] Patient language and need for interpreter reviewed:: Yes Do you feel safe going back to the place where you live?: Yes        Care giver support system in place?: Yes (comment) Current home services: DME (Pt has hospital bed, wheelchair, bedside commode, rolling walker) Criminal Activity/Legal Involvement Pertinent to Current Situation/Hospitalization: No - Comment as needed  Activities of Daily Living Home Assistive Devices/Equipment: None ADL Screening (condition at time of admission) Patient's cognitive ability adequate to safely complete daily activities?: Yes Is the patient deaf or have difficulty hearing?: No Does the patient have difficulty seeing, even when wearing glasses/contacts?: No Does the patient have difficulty concentrating, remembering, or making decisions?:  No Patient able to express need for assistance with ADLs?: Yes Does the patient have difficulty dressing or bathing?: Yes Independently performs ADLs?: No Communication: Independent Dressing (OT):  Needs assistance Is this a change from baseline?: Pre-admission baseline Grooming: Needs assistance Is this a change from baseline?: Pre-admission baseline Feeding: Independent Bathing: Needs assistance Is this a change from baseline?: Pre-admission baseline Toileting: Independent with device (comment) In/Out Bed: Independent with device (comment) Walks in Home: Independent with device (comment) Does the patient have difficulty walking or climbing stairs?: Yes Weakness of Legs: Both Weakness of Arms/Hands: Both  Permission Sought/Granted Permission sought to share information with : Family Arboriculturist granted to share information with : Yes, Verbal Permission Granted     Permission granted to share info w AGENCY: [facility]        Emotional Assessment Appearance:: Appears stated age Attitude/Demeanor/Rapport: Engaged Affect (typically observed): Appropriate Orientation: : Oriented to Situation,Oriented to  Time,Oriented to Place,Oriented to Self Alcohol / Substance Use: Not Applicable Psych Involvement: No (comment)  Admission diagnosis:  Shortness of breath [R06.02] Acute pulmonary edema (HCC) [J81.0] COPD exacerbation ([place]) [J44.1] Acute on chronic respiratory failure ([place]) [J96.20] Patient Active Problem List   Diagnosis Date Noted  . Shortness of breath   . Acute pulmonary edema (HCC)   . Anemia 12/05/2019  . Acute on chronic respiratory failure ([place]) 12/04/2019  . Acute on chronic diastolic CHF (congestive heart failure) ([place]) 12/04/2019  . Chronic respiratory failure with hypoxia and hypercapnia ([place]) 11/20/2019  . Respiratory failure with hypoxia ([place]) 11/04/2019  . Pressure injury of skin 10/22/2019  . Chronic diastolic CHF (congestive heart failure) ([place]) 10/22/2019  . Chronic back pain 10/22/2019  . Palliative care by specialist   . Goals of care, counseling/discussion   . DNR (do not resuscitate)    . Acute respiratory failure with hypercapnia ([place]) 09/29/2019  . Drug induced constipation   . Leukocytosis   . Supplemental oxygen dependent   . Anxiety state   . Debility 09/13/2019  . Back pain 09/08/2019  . Essential hypertension 07/27/2019  . Allergic rhinitis 03/14/2018  . Acute respiratory failure with hypoxia ([place]) 03/08/2018  . Elevated MCV 01/31/2018  . Vitamin B12 deficiency 01/31/2018  . Acute on chronic respiratory failure with hypoxia ([place]) 01/31/2018  . Respiratory failure, acute-on-chronic ([place]) 12/31/2017  . COPD exacerbation ([place]) 09/18/2017  . Hypoxia   . Hyperlipidemia 08/23/2017  . Depression 08/23/2017  . Anxiety 08/23/2017  . Hypercalcemia 08/23/2017  . Thrombocytosis 08/23/2017  . COPD with acute exacerbation ([place]) 08/23/2017  . ADD (attention deficit disorder) 08/30/2012  . Bipolar disorder, unspecified ([place]) 08/30/2012  . COPD  GOLD ? III with increased Eos and reversibility ? ACOS ?  08/30/2012  . Tobacco abuse 08/30/2012   PCP:  Simona Huh, NP Pharmacy:   [facility] [place], [address] [place] [address] Phone: [phone] Fax: [phone]  [place], [place] [address] Phone: [phone] Fax: [phone]   Readmission Risk Interventions Readmission Risk Prevention Plan 03/26/2020  Transportation Screening Complete  Medication Review ([name]) Complete  PCP or Specialist appointment within 3-5 days of discharge Complete  HRI or [place] Complete  SW Recovery Care/Counseling Consult Complete  [place] Not Applicable  Some recent data might be hidden

## 2020-03-26 NOTE — Social Work (Signed)
CSW spoke with patient at bedside. Patient requested help faxing over her social security paperwork to help get her medicare benefits started. CSW faxed over paperwork. Zac with [employer] is looking into see if patient has cobra benefits. CSW will continue to follow.

## 2020-03-26 NOTE — Progress Notes (Addendum)
Progress Note  Patient Name: Julia Daniels Date of Encounter: 03/26/2020  [facility] Cardiologist: Fransico Him, MD   Subjective   Cath yesterday with 60% LM and 70% mid LAD, elevated LVEDP/PCW and Severe lesion in the right ostial common iliac artery with a 25 mm Hg pressure gradient. AAA present.  Medical management recommended.    No further chest pain after starting Imdur. BP controlled but soft at 106/77mmHg.   Inpatient Medications    Scheduled Meds:  apixaban  5 mg Oral BID   aspirin EC  81 mg Oral Daily   atorvastatin  80 mg Oral Daily   azithromycin  500 mg Oral Daily   baclofen  5 mg Oral TID   chlorhexidine  15 mL Mouth Rinse BID   Chlorhexidine Gluconate Cloth  6 each Topical Q0600   famotidine  20 mg Oral QHS   fluticasone furoate-vilanterol  1 puff Inhalation Daily   furosemide  20 mg Intravenous BID   isosorbide mononitrate  15 mg Oral Daily   lamoTRIgine  200 mg Oral QHS   loratadine  10 mg Oral QPM   mouth rinse  15 mL Mouth Rinse q12n4p   mupirocin ointment  1 application Nasal BID   pantoprazole  40 mg Oral Daily   potassium chloride  20 mEq Oral Daily   predniSONE  40 mg Oral Q breakfast   QUEtiapine  150 mg Oral QHS   risperiDONE  2 mg Oral QHS   senna-docusate  1 tablet Oral BID   sodium chloride flush  3 mL Intravenous Q12H   sodium chloride flush  3 mL Intravenous Q12H   venlafaxine XR  150 mg Oral QHS   vitamin B-12  500 mcg Oral Daily   Continuous Infusions:  sodium chloride 10 mL/hr at 03/24/20 1153   PRN Meds: sodium chloride, acetaminophen, albuterol, ALPRAZolam, ipratropium-albuterol, nitroGLYCERIN, ondansetron (ZOFRAN) IV, polyvinyl alcohol, sodium chloride flush   Vital Signs    Vitals:   03/25/20 1953 03/25/20 2003 03/25/20 2301 03/26/20 0633  BP: 102/85   106/72  Pulse: (!) 103 (!) 103 (!) 104 93  Resp:  18 18 17   Temp:    99.2 F (37.3 C)  TempSrc:    Oral  SpO2: 93% 93%  96%  Weight:     101.4 kg  Height:        Intake/Output Summary (Last 24 hours) at 03/26/2020 9563 Last data filed at 03/26/2020 8756 Gross per 24 hour  Intake 600 ml  Output 3250 ml  Net -2650 ml   Last 3 Weights 03/26/2020 03/25/2020 03/24/2020  Weight (lbs) 223 lb 8.7 oz 223 lb 15.8 oz 217 lb 11.2 oz  Weight (kg) 101.4 kg 101.6 kg 98.748 kg      Telemetry    NSR - Personally Reviewed  ECG  No new EKG to review - Personally Reviewed  Physical Exam   GEN: Well nourished, well developed in no acute distress HEENT: Normal NECK: No JVD; No carotid bruits LYMPHATICS: No lymphadenopathy CARDIAC:RRR, no murmurs, rubs, gallops RESPIRATORY:  Clear to auscultation without rales, wheezing or rhonchi  ABDOMEN: Soft, non-tender, non-distended MUSCULOSKELETAL:  No edema; No deformity  SKIN: Warm and dry NEUROLOGIC:  Alert and oriented x 3 PSYCHIATRIC:  Normal affect    Labs    High Sensitivity Troponin:   Recent Labs  Lab 03/20/20 2328 03/21/20 0128 03/21/20 0529 03/21/20 0735  TROPONINIHS 39* 50* 49* 41*      Chemistry Recent Labs  Lab 03/24/20 0405 03/24/20 1044 03/24/20 1050 03/24/20 1413 03/25/20 0252 03/26/20 0229  NA 139   < > 139  --  137 137  K 3.9   < > 3.5  --  4.1 4.9  CL 94*  --   --   --  96* 95*  CO2 35*  --   --   --  35* 33*  GLUCOSE 98  --   --   --  118* 103*  BUN 20  --   --   --  16 16  CREATININE 0.82  --   --  0.82 0.67 0.75  CALCIUM 10.4*  --   --   --  9.7 10.3  GFRNONAA >60  --   --  >60 >60 >60  ANIONGAP 10  --   --   --  6 9   < > = values in this interval not displayed.     Hematology Recent Labs  Lab 03/24/20 1413 03/25/20 0252 03/26/20 0229  WBC 10.0 10.3 12.3*  RBC 3.84* 3.57* 3.65*  HGB 12.1 11.7* 11.8*  HCT 39.6 36.2 36.7  MCV 103.1* 101.4* 100.5*  MCH 31.5 32.8 32.3  MCHC 30.6 32.3 32.2  RDW 12.9 12.8 12.5  PLT 324 306 317    BNP Recent Labs  Lab 03/20/20 2327  BNP 76.8     DDimer No results for input(s): DDIMER in the  last 168 hours.   Radiology    CARDIAC CATHETERIZATION  Result Date: 03/24/2020  Ost LM to Mid LM lesion is 60% stenosed.  Mid LAD lesion is 70% stenosed.  The left ventricular systolic function is normal.  The left ventricular ejection fraction is 50-55% by visual estimate.  There is no aortic valve stenosis.  LV end diastolic pressure is moderately elevated.  Hemodynamic findings consistent with mild pulmonary hypertension. Ao sat 93%, PA sat 67%, PA pressure 36/26, mean PA pressure 30 mm Hg, mean PCWP 22 mm Hg; CO 6.27 L/min, CI 3.08  Severe lesion in the right ostial common iliac artery with a 25 mm Hg pressure gradient. AAA present.  Severe, calcific left main disease.  I do not think she will be a candidate for CABG.  Would be high risk PCI and would not pursue this given her lack of angina. Severe PAD as well which limit support options.  Still volume overloaded.  Would continue diuresis and intensify medical therapy for CAD.  Discussed with the son and with Dr. Radford Pax.   PERIPHERAL VASCULAR CATHETERIZATION  Result Date: 03/24/2020  Ost LM to Mid LM lesion is 60% stenosed.  Mid LAD lesion is 70% stenosed.  The left ventricular systolic function is normal.  The left ventricular ejection fraction is 50-55% by visual estimate.  There is no aortic valve stenosis.  LV end diastolic pressure is moderately elevated.  Hemodynamic findings consistent with mild pulmonary hypertension. Ao sat 93%, PA sat 67%, PA pressure 36/26, mean PA pressure 30 mm Hg, mean PCWP 22 mm Hg; CO 6.27 L/min, CI 3.08  Severe lesion in the right ostial common iliac artery with a 25 mm Hg pressure gradient. AAA present.  Severe, calcific left main disease.  I do not think she will be a candidate for CABG.  Would be high risk PCI and would not pursue this given her lack of angina. Severe PAD as well which limit support options.  Still volume overloaded.  Would continue diuresis and intensify medical therapy for CAD.   Discussed with the  son and with Dr. Radford Pax.    Cardiac Studies   2D echo  03/2020 IMPRESSIONS   1. Mild hypokinesis of the mid to apical anteroseptum, inferoseptum and  inferior myocardium. Left ventricular ejection fraction, by estimation, is  45 to 50%. The left ventricle has mildly decreased function. The left  ventricle demonstrates regional wall  motion abnormalities (see scoring diagram/findings for description). Left  ventricular diastolic parameters are consistent with Grade I diastolic  dysfunction (impaired relaxation). Elevated left ventricular end-diastolic  pressure.  2. Right ventricular systolic function is normal. The right ventricular  size is normal. There is normal pulmonary artery systolic pressure.  3. The mitral valve is normal in structure. No evidence of mitral valve  regurgitation. No evidence of mitral stenosis.  4. The aortic valve is normal in structure. Aortic valve regurgitation is  not visualized. No aortic stenosis is present.  5. Aortic dilatation noted. There is mild dilatation of the ascending  aorta, measuring 41 mm.  6. The inferior vena cava is dilated in size with >50% respiratory  variability, suggesting right atrial pressure of 8 mmHg.   Cardiac Cath 03/2020 Conclusion    Ost LM to Mid LM lesion is 60% stenosed.  Mid LAD lesion is 70% stenosed.  The left ventricular systolic function is normal.  The left ventricular ejection fraction is 50-55% by visual estimate.  There is no aortic valve stenosis.  LV end diastolic pressure is moderately elevated.  Hemodynamic findings consistent with mild pulmonary hypertension. Ao sat 93%, PA sat 67%, PA pressure 36/26, mean PA pressure 30 mm Hg, mean PCWP 22 mm Hg; CO 6.27 L/min, CI 3.08  Severe lesion in the right ostial common iliac artery with a 25 mm Hg pressure gradient. AAA present.   Severe, calcific left main disease.  I do not think she will be a candidate for CABG.  Would be  high risk PCI and would not pursue this given her lack of angina. Severe PAD as well which limit support options.  Still volume overloaded.  Would continue diuresis and intensify medical therapy for CAD.    Discussed with the son and with Dr. Radford Pax.   Coronary Diagrams   Diagnostic Dominance: Left   Patient Profile     70 y.o. female with a PMH of chronic diastolic CHF, HLD, COPD on home O2, anxiety, depression, GERD, and PE on Eliquis,who is being seen for the evaluation of acute combined CHF in setting of acute on chronic respiratory failure requiring BiPAP for O2 support.  Assessment & Plan    Acute combined systolic/diastolic CHF - Echo showed EF 45-50% with apical anteroseptum, inferoseptum, and inferior myocardium hypokinesis, new compared to 10/2019. Cath 50-55%.   - she put out 3.25L yesterday and is net neg 8.6L - weights are inaccurate since admit - SOB is multifactorial (COPD/CHF/CAD) - right and left heart cath showed elevated PCWP at 42mmHg mean, LVEPD 1mmHg  - SCr stable at 0.75 post cath - She is still SOB although suspect mainly from COPD exacerbation as she continues to have significant wheezing on exam today -continue Lasix 20mg  IV BID one more day and reassess -would like to add low dose BB given that her HR is borderline high in the upper 90's but BP too soft currently - follow strict I&Os, daily weights and renal function while diuresing - consider addition of Farxiga   ASCAD -Cath with 60% ostial/proximal LM and 70% LAD -had chest pain post cath resolved with SL NTG and now  on Imdur -discussed case with Interventional cardiology.  She is likely not a CABG candidate due to severity of underlying lung disease.  -felt to be high risk PCI given LM as well as severe PAD which would limit support options for a LM PCI and medical management recommended at this time due to lack of angina -BP on soft side but will try to add Imdur 15mg  daily -no BB due to soft  BP -continue ASA and high dose statin  Hx of PE - restarted apixaban 5mg  BID  HLD - 03/21/2020: Cholesterol 213; HDL 73; LDL Cholesterol 131; Triglycerides 44; VLDL 9  - LDL goal less than 70 - Lipitor increased to 80mg  qd - Lipid panel and LFTS in 6-8 weeks  Acute on chronic respiratory failure/COPD exacerbation - Per primary team  - will have pulmonary comment on degree of lung disease and risk for CABG   For questions or updates, please contact [facility] Please consult [email or web address] for contact info under        Signed, Fransico Him, MD  03/26/2020, 7:12 AM

## 2020-03-26 NOTE — Progress Notes (Signed)
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   Past Medical History:  Anxiety Asthma COPD Depression.    Significant Hospital Events:   5260  Consults:  PCCM  3/13   Procedures:  2d ech 3/13   Significant Diagnostic Tests:  Echo  3/13 >>>1. Mild hypokinesis of the mid to apical anteroseptum, inferoseptum and  inferior myocardium. Left ventricular ejection fraction, by estimation, is  45 to 50%. The left ventricle has mildly decreased function. The left ventricle demonstrates regional wall  motion abnormalities (see scoring diagram/findings for description). Left ventricular diastolic parameters are consistent with Grade I diastolic dysfunction (impaired relaxation). Elevated left ventricular end-diastolic pressure. 2. Right ventricular systolic function is normal.  Micro Data:  Resp viral panel 3/12 neg Sputum  >>>  Antimicrobials:  Zmax  3/13 >>>    Interim History / Subjective:   No acute events overnight.  Wore BiPAP while sleeping Continues to have dyspnea  Objective   Blood pressure 106/72, pulse 93, temperature 99.2 F (37.3 C), temperature source Oral, resp. rate 17, height 5\' 3"  (1.6 m), weight 101.4 kg, SpO2 96 %.        Intake/Output Summary (Last 24 hours) at 03/26/2020 1044 Last data filed at  03/26/2020 0915 Gross per 24 hour  Intake 600 ml  Output 3250 ml  Net -2650 ml   Filed Weights   03/24/20 0553 03/25/20 0655 03/26/20 5284  Weight: 98.7 kg 101.6 kg 101.4 kg   Physical Exam: Gen:      No acute distress, chronically ill appearing HEENT:  EOMI, sclera anicteric Neck:     No masses; no thyromegaly Lungs:    Diminished air entry CV:         Regular rate and rhythm; no murmurs Abd:      + bowel sounds; soft, non-tender; no palpable masses, no distension Ext:    No edema; adequate peripheral perfusion Skin:      Warm and dry; no rash Neuro: alert and oriented x 3  Labs/imaging reviewed Significant for WBC 12.3 No new imaging  Resolved Hospital Problem list      Assessment & Plan:    Acute on chronic hypoxic and hypercarbic respiratory failure, multifactorial  Chronic respiratory failure with hypercapnia secondary to probable OSA/OHS Acute systolic heart failure Severe COPD/asthma overlap, 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  Previous ABG's have documented high PCO2.  Patient would benefit from non-invasive ventilation.  Without this therapy, the patient is at high risk of ending up with worsening symptoms, worsened respiratory failure, need for ER visits and/or recurrent hospitalizations.  Bilevel device unable to adequately support patient's nocturnal ventilation needs.  Patient would benefit from NIV therapy with set tidal volumes and pressure.  Continue Breo,  nebs as needed Continue diuresis BiPAP as needed, will need trelegy prior to discharge  --Continue Breo and PRN Duonebs --Diuresis per Cardiology --DC Pulmicort --Start BiPAP. Will arrange for Trilogy prior to discharge  Acute systolic heart failure Coronary artery disease --Cath 3/16 with LAD 70% stenosis --Cardiology considering CABG.   Perioperative Assessment for Cardiac Surgery Patient at increased perioperative risk  due to COPD, smoking, OSA but no absolute contraindication for surgery if risk for respiratory complication outweighed by benefits See note from 3/17 for full details of assessment and recommendations  Marshell Garfinkel MD [facility] See Amion for pager  If no response to pager , please call [phone] until 7pm After 7:00 pm call Elink  [phone] 03/26/2020, 10:50 AM

## 2020-03-26 NOTE — Progress Notes (Signed)
PROGRESS NOTE    Julia Daniels  XBD:[id number] DOB: 02-18-1950 DOA: 03/20/2020 PCP: Simona Huh, NP    Brief Narrative:  Julia Daniels is a 70 year old female with past medical history significant for chronic respiratory failure/COPD on 2-3 L nasal cannula at baseline, anxiety, depression, chronic diastolic congestive heart failure, asthma, history of PE on anticoagulation with Eliquis, chronic back pain, hyperlipidemia, who presented to the ED with progressive shortness of breath.  Patient reports acute onset, 1 hour prior to ED arrival with general unwell feelings over the past week with no specific symptoms.  Patient was given epinephrine, albuterol, Atrovent, Solu-Medrol and magnesium and placed on CPAP by EMS and brought to the ED.  Hospital service consulted for further evaluation management of acute on chronic respiratory failure with hypoxia due to COPD and CHF exacerbation.   Assessment & Plan:   Acute on chronic respiratory failure with hypoxia/hypercarbia, POA Chronic respiratory failure/COPD on 3 L home O2 Acute on chronic combined systolic and diastolic congestive heart failure -Briefly required BiPAP on admission,  -clinically improving with diuresis, she is -5.9 L -TTE with LVEF 45-50% with regional wall motion abnormalities -Appreciate cardiology input, right and left heart cath 3/16 noted elevated blood pressure 22, 60% left main and 70% LAD lesion, not felt to be appropriate for PCI and poor CABG candidate, medical management recommended -Remains dyspneic with minimal activity, continue IV Lasix today, she is 8.5 L negative -Continue duo nebs, did not hear active wheezing at this time --Ambulate, out of bed to chair -Continue BiPAP nightly, PCCM recommended trilogy vent at discharge  CAD -CAD with left main disease as noted above, per cards may be higher risk due to chronic lung disease for CABG, pulmonary input requested  Acute on chronic combined CHF  with new cardiomyopathy TTE with LVEF 45-50%, G1DD. -As noted above, continue IV Lasix  Chronic respiratory failure -On 3 L O2 at baseline -Continue nebs  Pulmonary Embolism On Eliquis outpatient. -Off heparin, continue Eliquis  Hyperlipidemia: Atorvastatin 80 mg p.o. daily  GERD: Protonix 40 mg p.o. daily  Anxiety Depression -Continued on home regimen of Risperdal, Effexor, Lamictal, Seroquel  Morbid obesity Body mass index is 39.6 kg/m.  Discussed with patient needs for aggressive lifestyle changes/weight loss as this complicates all facets of care.  Outpatient follow-up with PCP.      DVT prophylaxis: Eliquis   Code Status: DNR Family Communication: Discussed with patient in detail, no family at bedside  Disposition Plan:  Level of care: Progressive Cardiac Status is: Inpatient  Remains inpatient appropriate because: Severity of illness   Dispo: The patient is from: Home              Anticipated d/c is to: Home              Patient currently is not medically stable to d/c.   Difficult to place patient No   Consultants:   PCCM  Cardiology  Procedures:   TTE  Antimicrobials:   Azithromycin   Subjective: -Continues to have shortness of breath, denies any cough Objective: Vitals:   03/25/20 1953 03/25/20 2003 03/25/20 2301 03/26/20 0633  BP: 102/85   106/72  Pulse: (!) 103 (!) 103 (!) 104 93  Resp:  18 18 17   Temp:    99.2 F (37.3 C)  TempSrc:    Oral  SpO2: 93% 93%  96%  Weight:    101.4 kg  Height:        Intake/Output Summary (Last  24 hours) at 03/26/2020 1450 Last data filed at 03/26/2020 0915 Gross per 24 hour  Intake 360 ml  Output 1550 ml  Net -1190 ml   Filed Weights   03/24/20 0553 03/25/20 0655 03/26/20 3545  Weight: 98.7 kg 101.6 kg 101.4 kg    Examination:  General exam: Pleasant elderly female sitting up in bed, AAOx3 HEENT: Neck obese unable to assess JVD CVS: S1-S2, regular rate rhythm Lungs: Decreased breath  sounds to bases, otherwise clear, poor air movement bilaterally Abdomen: Soft, nontender, bowel sounds present Remedies: No edema  Psychiatry:  Mood & affect appropriate.     Data Reviewed: I have personally reviewed following labs and imaging studies  CBC: Recent Labs  Lab 03/20/20 2234 03/22/20 0355 03/23/20 1857 03/24/20 0405 03/24/20 1044 03/24/20 1049 03/24/20 1050 03/24/20 1413 03/25/20 0252 03/26/20 0229  WBC 12.7*   < > 10.4 10.8*  --   --   --  10.0 10.3 12.3*  NEUTROABS 10.7*  --   --   --   --   --   --   --   --   --   HGB 10.8*   < > 12.6 12.2   < > 12.6 12.6 12.1 11.7* 11.8*  HCT 36.7   < > 40.6 40.2   < > 37.0 37.0 39.6 36.2 36.7  MCV 107.9*   < > 102.5* 103.1*  --   --   --  103.1* 101.4* 100.5*  PLT 266   < > 332 296  --   --   --  324 306 317   < > = values in this interval not displayed.   Basic Metabolic Panel: Recent Labs  Lab 03/22/20 0355 03/23/20 0828 03/24/20 0405 03/24/20 1044 03/24/20 1049 03/24/20 1050 03/24/20 1413 03/25/20 0252 03/26/20 0229  NA 138 139 139 139 139 139  --  137 137  K 4.6 3.5 3.9 3.5 3.6 3.5  --  4.1 4.9  CL 95* 94* 94*  --   --   --   --  96* 95*  CO2 35* 33* 35*  --   --   --   --  35* 33*  GLUCOSE 143* 128* 98  --   --   --   --  118* 103*  BUN 15 27* 20  --   --   --   --  16 16  CREATININE 0.53 0.79 0.82  --   --   --  0.82 0.67 0.75  CALCIUM 10.5* 10.4* 10.4*  --   --   --   --  9.7 10.3   GFR: Estimated Creatinine Clearance: 77.6 mL/min (by C-G formula based on SCr of 0.75 mg/dL). Liver Function Tests: No results for input(s): AST, ALT, ALKPHOS, BILITOT, PROT, ALBUMIN in the last 168 hours. No results for input(s): LIPASE, AMYLASE in the last 168 hours. No results for input(s): AMMONIA in the last 168 hours. Coagulation Profile: No results for input(s): INR, PROTIME in the last 168 hours. Cardiac Enzymes: No results for input(s): CKTOTAL, CKMB, CKMBINDEX, TROPONINI in the last 168 hours. BNP (last 3  results) No results for input(s): PROBNP in the last 8760 hours. HbA1C: No results for input(s): HGBA1C in the last 72 hours. CBG: No results for input(s): GLUCAP in the last 168 hours. Lipid Profile: No results for input(s): CHOL, HDL, LDLCALC, TRIG, CHOLHDL, LDLDIRECT in the last 72 hours. Thyroid Function Tests: No results for input(s): TSH, T4TOTAL, FREET4, T3FREE, THYROIDAB in  the last 72 hours. Anemia Panel: No results for input(s): VITAMINB12, FOLATE, FERRITIN, TIBC, IRON, RETICCTPCT in the last 72 hours. Sepsis Labs: No results for input(s): PROCALCITON, LATICACIDVEN in the last 168 hours.  Recent Results (from the past 240 hour(s))  Resp Panel by RT-PCR (Flu A&B, Covid) Nasopharyngeal Swab     Status: None   Collection Time: 03/20/20  9:50 PM   Specimen: Nasopharyngeal Swab; Nasopharyngeal(NP) swabs in vial transport medium  Result Value Ref Range Status   SARS Coronavirus 2 by RT PCR NEGATIVE NEGATIVE Final    Comment: (NOTE) SARS-CoV-2 target nucleic acids are NOT DETECTED.  The SARS-CoV-2 RNA is generally detectable in upper respiratory specimens during the acute phase of infection. The lowest concentration of SARS-CoV-2 viral copies this assay can detect is 138 copies/mL. A negative result does not preclude SARS-Cov-2 infection and should not be used as the sole basis for treatment or other patient management decisions. A negative result may occur with  improper specimen collection/handling, submission of specimen other than nasopharyngeal swab, presence of viral mutation(s) within the areas targeted by this assay, and inadequate number of viral copies(<138 copies/mL). A negative result must be combined with clinical observations, patient history, and epidemiological information. The expected result is Negative.  Fact Sheet for Patients:  EntrepreneurPulse.com.au  Fact Sheet for Healthcare Providers:   IncredibleEmployment.be  This test is no t yet approved or cleared by the [place] FDA and  has been authorized for detection and/or diagnosis of SARS-CoV-2 by FDA under an Emergency Use Authorization (EUA). This EUA will remain  in effect (meaning this test can be used) for the duration of the COVID-19 declaration under Section 564(b)(1) of the Act, 21 U.S.C.section 360bbb-3(b)(1), unless the authorization is terminated  or revoked sooner.       Influenza A by PCR NEGATIVE NEGATIVE Final   Influenza B by PCR NEGATIVE NEGATIVE Final    Comment: (NOTE) The Xpert Xpress SARS-CoV-2/FLU/RSV plus assay is intended as an aid in the diagnosis of influenza from Nasopharyngeal swab specimens and should not be used as a sole basis for treatment. Nasal washings and aspirates are unacceptable for Xpert Xpress SARS-CoV-2/FLU/RSV testing.  Fact Sheet for Patients: EntrepreneurPulse.com.au  Fact Sheet for Healthcare Providers: IncredibleEmployment.be  This test is not yet approved or cleared by the [place] FDA and has been authorized for detection and/or diagnosis of SARS-CoV-2 by FDA under an Emergency Use Authorization (EUA). This EUA will remain in effect (meaning this test can be used) for the duration of the COVID-19 declaration under Section 564(b)(1) of the Act, 21 U.S.C. section 360bbb-3(b)(1), unless the authorization is terminated or revoked.  Performed at [facility], [place] [address] [place], [place] [address]   MRSA PCR Screening     Status: Abnormal   Collection Time: 03/24/20  4:33 AM   Specimen: Nasopharyngeal  Result Value Ref Range Status   MRSA by PCR POSITIVE (A) NEGATIVE Final    Comment:        The GeneXpert MRSA Assay (FDA approved for NASAL specimens only), is one component of a comprehensive MRSA colonization surveillance program. It is not intended to diagnose  MRSA infection nor to guide or monitor treatment for MRSA infections. RESULT CALLED TO, READ BACK BY AND VERIFIED WITH: IRIS RN AT [phone] ON 03/24/20 BY S.VANHOORNE Performed at [facility], [place] [address] [place],  [address]     Scheduled Meds: . apixaban  5 mg Oral BID  . aspirin  EC  81 mg Oral Daily  . atorvastatin  80 mg Oral Daily  . baclofen  5 mg Oral TID  . chlorhexidine  15 mL Mouth Rinse BID  . Chlorhexidine Gluconate Cloth  6 each Topical Q0600  . famotidine  20 mg Oral QHS  . fluticasone furoate-vilanterol  1 puff Inhalation Daily  . furosemide  20 mg Intravenous BID  . isosorbide mononitrate  15 mg Oral Daily  . lamoTRIgine  200 mg Oral QHS  . loratadine  10 mg Oral QPM  . mouth rinse  15 mL Mouth Rinse q12n4p  . mupirocin ointment  1 application Nasal BID  . pantoprazole  40 mg Oral Daily  . potassium chloride  20 mEq Oral Daily  . QUEtiapine  150 mg Oral QHS  . risperiDONE  2 mg Oral QHS  . senna-docusate  1 tablet Oral BID  . sodium chloride flush  3 mL Intravenous Q12H  . sodium chloride flush  3 mL Intravenous Q12H  . venlafaxine XR  150 mg Oral QHS  . vitamin B-12  500 mcg Oral Daily   Continuous Infusions: . sodium chloride 10 mL/hr at 03/24/20 1153     LOS: 5 days    Time spent: 62min   Domenic Polite, MD [facility]  03/26/2020, 2:50 PM

## 2020-03-27 LAB — CBC
HCT: 36.9 % (ref 36.0–46.0)
Hemoglobin: 11.5 g/dL — ABNORMAL LOW (ref 12.0–15.0)
MCH: 31.4 pg (ref 26.0–34.0)
MCHC: 31.2 g/dL (ref 30.0–36.0)
MCV: 100.8 fL — ABNORMAL HIGH (ref 80.0–100.0)
Platelets: 283 K/uL (ref 150–400)
RBC: 3.66 MIL/uL — ABNORMAL LOW (ref 3.87–5.11)
RDW: 12.7 % (ref 11.5–15.5)
WBC: 10.2 K/uL (ref 4.0–10.5)
nRBC: 0 % (ref 0.0–0.2)

## 2020-03-27 LAB — BASIC METABOLIC PANEL WITH GFR
Anion gap: 7 (ref 5–15)
BUN: 16 mg/dL (ref 8–23)
CO2: 35 mmol/L — ABNORMAL HIGH (ref 22–32)
Calcium: 10.6 mg/dL — ABNORMAL HIGH (ref 8.9–10.3)
Chloride: 94 mmol/L — ABNORMAL LOW (ref 98–111)
Creatinine, Ser: 0.79 mg/dL (ref 0.44–1.00)
GFR, Estimated: >60 mL/min (ref >60)
Glucose, Bld: 107 mg/dL — ABNORMAL HIGH (ref 70–99)
Potassium: 4.4 mmol/L (ref 3.5–5.1)
Sodium: 136 mmol/L (ref 135–145)

## 2020-03-27 MED ORDER — METOPROLOL SUCCINATE ER 25 MG PO TB24
25.0000 mg | ORAL_TABLET | Freq: Every day | ORAL | Status: DC
  Administered 2020-03-27: 25 mg via ORAL
  Filled 2020-03-27 (×2): qty 1

## 2020-03-27 NOTE — Progress Notes (Addendum)
Progress Note  Patient Name: Julia Daniels Date of Encounter: 03/27/2020  [facility] Cardiologist: Fransico Him, MD   Subjective   Cath this admission  with 60% LM and 70% mid LAD, elevated LVEDP/PCW and Severe lesion in the right ostial common iliac artery with a 25 mm Hg pressure gradient. AAA present.  Medical management recommended.    Denies any chest pain but still SOB  Inpatient Medications    Scheduled Meds: . apixaban  5 mg Oral BID  . aspirin EC  81 mg Oral Daily  . atorvastatin  80 mg Oral Daily  . baclofen  5 mg Oral TID  . chlorhexidine  15 mL Mouth Rinse BID  . Chlorhexidine Gluconate Cloth  6 each Topical Q0600  . famotidine  20 mg Oral QHS  . fluticasone furoate-vilanterol  1 puff Inhalation Daily  . furosemide  20 mg Intravenous BID  . isosorbide mononitrate  15 mg Oral Daily  . lamoTRIgine  200 mg Oral QHS  . loratadine  10 mg Oral QPM  . mouth rinse  15 mL Mouth Rinse q12n4p  . mupirocin ointment  1 application Nasal BID  . pantoprazole  40 mg Oral Daily  . potassium chloride  20 mEq Oral Daily  . QUEtiapine  150 mg Oral QHS  . risperiDONE  2 mg Oral QHS  . senna-docusate  1 tablet Oral BID  . sodium chloride flush  3 mL Intravenous Q12H  . sodium chloride flush  3 mL Intravenous Q12H  . venlafaxine XR  150 mg Oral QHS  . vitamin B-12  500 mcg Oral Daily   Continuous Infusions: . sodium chloride 10 mL/hr at 03/24/20 1153   PRN Meds: sodium chloride, acetaminophen, albuterol, ALPRAZolam, ipratropium-albuterol, nitroGLYCERIN, ondansetron (ZOFRAN) IV, polyvinyl alcohol, sodium chloride flush   Vital Signs    Vitals:   03/26/20 1449 03/26/20 1957 03/26/20 2324 03/27/20 0544  BP: 91/62 100/79 94/72 126/88  Pulse: 99 100 97 91  Resp: 18 18 17 17   Temp: 98.9 F (37.2 C) 98.9 F (37.2 C) 98.7 F (37.1 C) 97.9 F (36.6 C)  TempSrc: Oral Oral Oral Axillary  SpO2: 97% 97% 94% 96%  Weight:    101.6 kg  Height:        Intake/Output  Summary (Last 24 hours) at 03/27/2020 8341 Last data filed at 03/27/2020 0600 Gross per 24 hour  Intake 360 ml  Output 300 ml  Net 60 ml   Last 3 Weights 03/27/2020 03/26/2020 03/25/2020  Weight (lbs) 223 lb 15.8 oz 223 lb 8.7 oz 223 lb 15.8 oz  Weight (kg) 101.6 kg 101.4 kg 101.6 kg      Telemetry    NSR - Personally Reviewed  ECG  No new EKG to review - Personally Reviewed  Physical Exam   GEN: Well nourished, well developed in no acute distress HEENT: Normal NECK: No JVD; No carotid bruits LYMPHATICS: No lymphadenopathy CARDIAC:RRR, no murmurs, rubs, gallops RESPIRATORY:  Diffuse inspiratory and expiratory wheezes ABDOMEN: Soft, non-tender, non-distended MUSCULOSKELETAL:  No edema; No deformity  SKIN: Warm and dry NEUROLOGIC:  Alert and oriented x 3 PSYCHIATRIC:  Normal affect    Labs    High Sensitivity Troponin:   Recent Labs  Lab 03/20/20 2328 03/21/20 0128 03/21/20 0529 03/21/20 0735  TROPONINIHS 39* 50* 49* 41*      Chemistry Recent Labs  Lab 03/25/20 0252 03/26/20 0229 03/27/20 0220  NA 137 137 136  K 4.1 4.9 4.4  CL 96*  95* 94*  CO2 35* 33* 35*  GLUCOSE 118* 103* 107*  BUN 16 16 16   CREATININE 0.67 0.75 0.79  CALCIUM 9.7 10.3 10.6*  GFRNONAA >60 >60 >60  ANIONGAP 6 9 7      Hematology Recent Labs  Lab 03/25/20 0252 03/26/20 0229 03/27/20 0220  WBC 10.3 12.3* 10.2  RBC 3.57* 3.65* 3.66*  HGB 11.7* 11.8* 11.5*  HCT 36.2 36.7 36.9  MCV 101.4* 100.5* 100.8*  MCH 32.8 32.3 31.4  MCHC 32.3 32.2 31.2  RDW 12.8 12.5 12.7  PLT 306 317 283    BNP Recent Labs  Lab 03/20/20 2327  BNP 76.8     DDimer No results for input(s): DDIMER in the last 168 hours.   Radiology    No results found.  Cardiac Studies   2D echo  03/2020 IMPRESSIONS   1. Mild hypokinesis of the mid to apical anteroseptum, inferoseptum and  inferior myocardium. Left ventricular ejection fraction, by estimation, is  45 to 50%. The left ventricle has mildly  decreased function. The left  ventricle demonstrates regional wall  motion abnormalities (see scoring diagram/findings for description). Left  ventricular diastolic parameters are consistent with Grade I diastolic  dysfunction (impaired relaxation). Elevated left ventricular end-diastolic  pressure.  2. Right ventricular systolic function is normal. The right ventricular  size is normal. There is normal pulmonary artery systolic pressure.  3. The mitral valve is normal in structure. No evidence of mitral valve  regurgitation. No evidence of mitral stenosis.  4. The aortic valve is normal in structure. Aortic valve regurgitation is  not visualized. No aortic stenosis is present.  5. Aortic dilatation noted. There is mild dilatation of the ascending  aorta, measuring 41 mm.  6. The inferior vena cava is dilated in size with >50% respiratory  variability, suggesting right atrial pressure of 8 mmHg.   Cardiac Cath 03/2020 Conclusion    Ost LM to Mid LM lesion is 60% stenosed.  Mid LAD lesion is 70% stenosed.  The left ventricular systolic function is normal.  The left ventricular ejection fraction is 50-55% by visual estimate.  There is no aortic valve stenosis.  LV end diastolic pressure is moderately elevated.  Hemodynamic findings consistent with mild pulmonary hypertension. Ao sat 93%, PA sat 67%, PA pressure 36/26, mean PA pressure 30 mm Hg, mean PCWP 22 mm Hg; CO 6.27 L/min, CI 3.08  Severe lesion in the right ostial common iliac artery with a 25 mm Hg pressure gradient. AAA present.   Severe, calcific left main disease.  I do not think she will be a candidate for CABG.  Would be high risk PCI and would not pursue this given her lack of angina. Severe PAD as well which limit support options.  Still volume overloaded.  Would continue diuresis and intensify medical therapy for CAD.    Discussed with the son and with Dr. Radford Pax.   Coronary  Diagrams   Diagnostic Dominance: Left   Patient Profile     70 y.o. female with a PMH of chronic diastolic CHF, HLD, COPD on home O2, anxiety, depression, GERD, and PE on Eliquis,who is being seen for the evaluation of acute combined CHF in setting of acute on chronic respiratory failure requiring BiPAP for O2 support.  Assessment & Plan    Acute combined systolic/diastolic CHF - Echo showed EF 45-50% with apical anteroseptum, inferoseptum, and inferior myocardium hypokinesis, new compared to 10/2019. Cath 50-55%.   - I&O's are incomplete - weights are inaccurate  since admit - SOB is multifactorial (COPD/CHF/CAD) - right and left heart cath showed elevated PCWP at 74mmHg mean, LVEPD 14mmHg  - SCr stable at 0.79 - She is still SOB although suspect mainly from COPD exacerbation as she continues to have significant wheezing on exam today - will change to Lasix 40mg  PO daily starting today - start B1 selective BB Toprol XL 25mg  daily as HR is in the 90's - follow strict I&Os, daily weights and renal function while diuresing - consider addition of ARB in outpt setting if BP remains stable  ASCAD -Cath with 60% ostial/proximal LM and 70% LAD -had chest pain post cath resolved with SL NTG and now on Imdur -discussed case with Interventional cardiology.  She is not a good candidate for CABG  due to severity of underlying lung disease. Pulmonary felt at high risk but not absolute contraindication. -will try medical therapy first since she has had minimal angina and if she has recurrent CP then get CVTS consult -felt to be high risk PCI given LM as well as severe PAD which would limit support options for a LM PCI and medical management recommended at this time due to lack of angina -denies any further CP (she only had 1 episode of CP and that was after cath) after addition of long acting nitrate -starting low dose BB -continue ASA and high dose statin  Hx of PE - restarted apixaban 5mg   BID  HLD - 03/21/2020: Cholesterol 213; HDL 73; LDL Cholesterol 131; Triglycerides 44; VLDL 9  - LDL goal less than 70 - Lipitor increased to 80mg  qd - Lipid panel and LFTS in 6-8 weeks  Acute on chronic respiratory failure/COPD exacerbation - Per primary team  -pulmonary feels at increased perioperative risk due to COPD, smoking, OSA but no absolute contraindication for surgery if risk for respiratory complication outweighed by benefits  I have spent a total of 30 minutes with patient reviewing cardiac cath, 2D echo , telemetry, EKGs, labs and examining patient as well as establishing an assessment and plan that was discussed with the patient.  > 50% of time was spent in direct patient care.    For questions or updates, please contact [place] Please consult [email or web address] for contact info under        Signed, Fransico Him, MD  03/27/2020, 8:21 AM

## 2020-03-27 NOTE — Progress Notes (Signed)
PROGRESS NOTE    Julia Daniels  ZOX:[id number] DOB: 1950/05/19 DOA: 03/20/2020 PCP: Simona Huh, NP    Brief Narrative:  Julia Daniels is a 70 year old female with past medical history significant for chronic respiratory failure/COPD on 2-3 L nasal cannula at baseline, anxiety, depression, chronic diastolic congestive heart failure, asthma, history of PE on anticoagulation with Eliquis, chronic back pain, hyperlipidemia, who presented to the ED with progressive shortness of breath.  Patient reports acute onset, 1 hour prior to ED arrival with general unwell feelings over the past week with no specific symptoms.  Patient was given epinephrine, albuterol, Atrovent, Solu-Medrol and magnesium and placed on CPAP by EMS and brought to the ED.  Hospital service consulted for further evaluation management of acute on chronic respiratory failure with hypoxia due to COPD and CHF exacerbation.   Assessment & Plan:   Acute on chronic respiratory failure with hypoxia/hypercarbia, POA Chronic respiratory failure/COPD on 3 L home O2 Acute on chronic combined systolic and diastolic congestive heart failure -Briefly required BiPAP on admission,  -clinically improving with diuresis, she is 8.5 L negative -TTE with LVEF 45-50% with regional wall motion abnormalities -Appreciate cardiology input, right and left heart cath 3/16 noted elevated blood pressure 22, 60% left main and 70% LAD lesion, not felt to be appropriate for PCI and poor CABG candidate, medical management recommended -Remains dyspneic with minimal activity which is multifactorial, continue diuretics 1 more day, suspect CAD and COPD are likely contributing -I do not hear any active wheezing, continue duo nebs  -Ambulate, increase activity as tolerated  -Appreciate PCCM input, plan for trilogy nocturnal vent at discharge  CAD -CAD with left main disease as noted above, per cards may be higher risk due to chronic lung disease for  CABG, pulmonary input requested,-felt to be at increased perioperative risk but not contraindication -Continue aspirin, started on beta-blocker and statin  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 -As noted above, continue IV Lasix  Chronic respiratory failure -On 3 L O2 at baseline -Continue nebs  Pulmonary Embolism On Eliquis outpatient. -Off heparin, continue Eliquis  Hyperlipidemia: Atorvastatin 80 mg p.o. daily  GERD: Protonix 40 mg p.o. daily  Anxiety Depression -Continued on home regimen of Risperdal, Effexor, Lamictal, Seroquel  Morbid obesity Body mass index is 39.68 kg/m.  Discussed with patient needs for aggressive lifestyle changes/weight loss as this complicates all facets of care.  Outpatient follow-up with PCP.      DVT prophylaxis: Eliquis   Code Status: DNR Family Communication: Discussed with patient in detail, no family at bedside  Disposition Plan:  Level of care: Progressive Cardiac Status is: Inpatient  Remains inpatient appropriate because: Severity of illness   Dispo: The patient is from: Home              Anticipated d/c is to: Home              Patient currently is not medically stable to d/c.   Difficult to place patient No   Consultants:   PCCM  Cardiology  Procedures:   TTE  Antimicrobials:   Azithromycin   Subjective: -Breathing a little better, still not back to baseline, dyspneic with minimal activity Objective: Vitals:   03/26/20 1957 03/26/20 2324 03/27/20 0544 03/27/20 0841  BP: 100/79 94/72 126/88   Pulse: 100 97 91   Resp: 18 17 17    Temp: 98.9 F (37.2 C) 98.7 F (37.1 C) 97.9 F (36.6 C)  TempSrc: Oral Oral Axillary   SpO2: 97% 94% 96% 95%  Weight:   101.6 kg   Height:        Intake/Output Summary (Last 24 hours) at 03/27/2020 1111 Last data filed at 03/27/2020 0600 Gross per 24 hour  Intake --  Output 300 ml  Net -300 ml   Filed Weights   03/25/20 0655  03/26/20 0633 03/27/20 0544  Weight: 101.6 kg 101.4 kg 101.6 kg    Examination:  General exam: Pleasant chronically ill female sitting up in bed, AAOx3, no distress  HEENT: Neck obese unable to assess JVD CVS: S1-S2, regular rate rhythm Lungs: Poor air movement bilaterally, otherwise clear Abdomen: Soft, nontender, bowel sounds present Extremities: No edema  Psychiatry:  Mood & affect appropriate.     Data Reviewed: I have personally reviewed following labs and imaging studies  CBC: Recent Labs  Lab 03/20/20 2234 03/22/20 0355 03/24/20 0405 03/24/20 1044 03/24/20 1050 03/24/20 1413 03/25/20 0252 03/26/20 0229 03/27/20 0220  WBC 12.7*   < > 10.8*  --   --  10.0 10.3 12.3* 10.2  NEUTROABS 10.7*  --   --   --   --   --   --   --   --   HGB 10.8*   < > 12.2   < > 12.6 12.1 11.7* 11.8* 11.5*  HCT 36.7   < > 40.2   < > 37.0 39.6 36.2 36.7 36.9  MCV 107.9*   < > 103.1*  --   --  103.1* 101.4* 100.5* 100.8*  PLT 266   < > 296  --   --  324 306 317 283   < > = values in this interval not displayed.   Basic Metabolic Panel: Recent Labs  Lab 03/23/20 0828 03/24/20 0405 03/24/20 1044 03/24/20 1049 03/24/20 1050 03/24/20 1413 03/25/20 0252 03/26/20 0229 03/27/20 0220  NA 139 139   < > 139 139  --  137 137 136  K 3.5 3.9   < > 3.6 3.5  --  4.1 4.9 4.4  CL 94* 94*  --   --   --   --  96* 95* 94*  CO2 33* 35*  --   --   --   --  35* 33* 35*  GLUCOSE 128* 98  --   --   --   --  118* 103* 107*  BUN 27* 20  --   --   --   --  16 16 16   CREATININE 0.79 0.82  --   --   --  0.82 0.67 0.75 0.79  CALCIUM 10.4* 10.4*  --   --   --   --  9.7 10.3 10.6*   < > = values in this interval not displayed.   GFR: Estimated Creatinine Clearance: 77.7 mL/min (by C-G formula based on SCr of 0.79 mg/dL). Liver Function Tests: No results for input(s): AST, ALT, ALKPHOS, BILITOT, PROT, ALBUMIN in the last 168 hours. No results for input(s): LIPASE, AMYLASE in the last 168 hours. No results  for input(s): AMMONIA in the last 168 hours. Coagulation Profile: No results for input(s): INR, PROTIME in the last 168 hours. Cardiac Enzymes: No results for input(s): CKTOTAL, CKMB, CKMBINDEX, TROPONINI in the last 168 hours. BNP (last 3 results) No results for input(s): PROBNP in the last 8760 hours. HbA1C: No results for input(s): HGBA1C in the last 72 hours. CBG: No results for input(s): GLUCAP in the last 168 hours. Lipid  Profile: No results for input(s): CHOL, HDL, LDLCALC, TRIG, CHOLHDL, LDLDIRECT in the last 72 hours. Thyroid Function Tests: No results for input(s): TSH, T4TOTAL, FREET4, T3FREE, THYROIDAB in the last 72 hours. Anemia Panel: No results for input(s): VITAMINB12, FOLATE, FERRITIN, TIBC, IRON, RETICCTPCT in the last 72 hours. Sepsis Labs: No results for input(s): PROCALCITON, LATICACIDVEN in the last 168 hours.  Recent Results (from the past 240 hour(s))  Resp Panel by RT-PCR (Flu A&B, Covid) Nasopharyngeal Swab     Status: None   Collection Time: 03/20/20  9:50 PM   Specimen: Nasopharyngeal Swab; Nasopharyngeal(NP) swabs in vial transport medium  Result Value Ref Range Status   SARS Coronavirus 2 by RT PCR NEGATIVE NEGATIVE Final    Comment: (NOTE) SARS-CoV-2 target nucleic acids are NOT DETECTED.  The SARS-CoV-2 RNA is generally detectable in upper respiratory specimens during the acute phase of infection. The lowest concentration of SARS-CoV-2 viral copies this assay can detect is 138 copies/mL. A negative result does not preclude SARS-Cov-2 infection and should not be used as the sole basis for treatment or other patient management decisions. A negative result may occur with  improper specimen collection/handling, submission of specimen other than nasopharyngeal swab, presence of viral mutation(s) within the areas targeted by this assay, and inadequate number of viral copies(<138 copies/mL). A negative result must be combined with clinical  observations, patient history, and epidemiological information. The expected result is Negative.  Fact Sheet for Patients:  EntrepreneurPulse.com.au  Fact Sheet for Healthcare Providers:  IncredibleEmployment.be  This test is no t yet approved or cleared by the [place] FDA and  has been authorized for detection and/or diagnosis of SARS-CoV-2 by FDA under an Emergency Use Authorization (EUA). This EUA will remain  in effect (meaning this test can be used) for the duration of the COVID-19 declaration under Section 564(b)(1) of the Act, 21 U.S.C.section 360bbb-3(b)(1), unless the authorization is terminated  or revoked sooner.       Influenza A by PCR NEGATIVE NEGATIVE Final   Influenza B by PCR NEGATIVE NEGATIVE Final    Comment: (NOTE) The Xpert Xpress SARS-CoV-2/FLU/RSV plus assay is intended as an aid in the diagnosis of influenza from Nasopharyngeal swab specimens and should not be used as a sole basis for treatment. Nasal washings and aspirates are unacceptable for Xpert Xpress SARS-CoV-2/FLU/RSV testing.  Fact Sheet for Patients: EntrepreneurPulse.com.au  Fact Sheet for Healthcare Providers: IncredibleEmployment.be  This test is not yet approved or cleared by the [place] FDA and has been authorized for detection and/or diagnosis of SARS-CoV-2 by FDA under an Emergency Use Authorization (EUA). This EUA will remain in effect (meaning this test can be used) for the duration of the COVID-19 declaration under Section 564(b)(1) of the Act, 21 U.S.C. section 360bbb-3(b)(1), unless the authorization is terminated or revoked.  Performed at [facility], [place] [address], [place], [place] [address]   MRSA PCR Screening     Status: Abnormal   Collection Time: 03/24/20  4:33 AM   Specimen: Nasopharyngeal  Result Value Ref Range Status   MRSA by PCR POSITIVE (A) NEGATIVE Final     Comment:        The GeneXpert MRSA Assay (FDA approved for NASAL specimens only), is one component of a comprehensive MRSA colonization surveillance program. It is not intended to diagnose MRSA infection nor to guide or monitor treatment for MRSA infections. RESULT CALLED TO, READ BACK BY AND VERIFIED WITH: IRIS RN AT 0753 ON 03/24/20 BY S.VANHOORNE  Performed at [name], [place] [address], [place], [place] [address]     Scheduled Meds: . apixaban  5 mg Oral BID  . aspirin EC  81 mg Oral Daily  . atorvastatin  80 mg Oral Daily  . baclofen  5 mg Oral TID  . chlorhexidine  15 mL Mouth Rinse BID  . Chlorhexidine Gluconate Cloth  6 each Topical Q0600  . famotidine  20 mg Oral QHS  . fluticasone furoate-vilanterol  1 puff Inhalation Daily  . furosemide  20 mg Intravenous BID  . isosorbide mononitrate  15 mg Oral Daily  . lamoTRIgine  200 mg Oral QHS  . loratadine  10 mg Oral QPM  . mouth rinse  15 mL Mouth Rinse q12n4p  . metoprolol succinate  25 mg Oral Daily  . mupirocin ointment  1 application Nasal BID  . pantoprazole  40 mg Oral Daily  . potassium chloride  20 mEq Oral Daily  . QUEtiapine  150 mg Oral QHS  . risperiDONE  2 mg Oral QHS  . senna-docusate  1 tablet Oral BID  . sodium chloride flush  3 mL Intravenous Q12H  . sodium chloride flush  3 mL Intravenous Q12H  . venlafaxine XR  150 mg Oral QHS  . vitamin B-12  500 mcg Oral Daily   Continuous Infusions: . sodium chloride 10 mL/hr at 03/24/20 1153     LOS: 6 days    Time spent: 68min   Domenic Polite, MD [facility]  03/27/2020, 11:11 AM

## 2020-03-28 LAB — BASIC METABOLIC PANEL WITH GFR
Anion gap: 5 (ref 5–15)
BUN: 16 mg/dL (ref 8–23)
CO2: 41 mmol/L — ABNORMAL HIGH (ref 22–32)
Calcium: 10.5 mg/dL — ABNORMAL HIGH (ref 8.9–10.3)
Chloride: 91 mmol/L — ABNORMAL LOW (ref 98–111)
Creatinine, Ser: 0.71 mg/dL (ref 0.44–1.00)
GFR, Estimated: >60 mL/min
Glucose, Bld: 105 mg/dL — ABNORMAL HIGH (ref 70–99)
Potassium: 4.4 mmol/L (ref 3.5–5.1)
Sodium: 137 mmol/L (ref 135–145)

## 2020-03-28 LAB — CBC
HCT: 36.7 % (ref 36.0–46.0)
Hemoglobin: 11.7 g/dL — ABNORMAL LOW (ref 12.0–15.0)
MCH: 32.1 pg (ref 26.0–34.0)
MCHC: 31.9 g/dL (ref 30.0–36.0)
MCV: 100.5 fL — ABNORMAL HIGH (ref 80.0–100.0)
Platelets: 310 10*3/uL (ref 150–400)
RBC: 3.65 MIL/uL — ABNORMAL LOW (ref 3.87–5.11)
RDW: 12.7 % (ref 11.5–15.5)
WBC: 11.7 10*3/uL — ABNORMAL HIGH (ref 4.0–10.5)
nRBC: 0 % (ref 0.0–0.2)

## 2020-03-28 MED ORDER — METHYLPREDNISOLONE SODIUM SUCC 40 MG IJ SOLR
40.0000 mg | Freq: Two times a day (BID) | INTRAMUSCULAR | Status: DC
  Administered 2020-03-28 – 2020-03-29 (×2): 40 mg via INTRAVENOUS
  Filled 2020-03-28 (×3): qty 1

## 2020-03-28 NOTE — Progress Notes (Addendum)
Progress Note  Patient Name: Julia Daniels Date of Encounter: 03/28/2020  [facility] Cardiologist: Fransico Him, MD   Subjective   Cath this admission  with 60% LM and 70% mid LAD, elevated LVEDP/PCW and Severe lesion in the right ostial common iliac artery with a 25 mm Hg pressure gradient. AAA present.  Medical management recommended.    SOB much with inhalers.  Had a few twinges of CP yesterday there were very short lived in setting of hypotension  Inpatient Medications    Scheduled Meds: . apixaban  5 mg Oral BID  . aspirin EC  81 mg Oral Daily  . atorvastatin  80 mg Oral Daily  . baclofen  5 mg Oral TID  . chlorhexidine  15 mL Mouth Rinse BID  . famotidine  20 mg Oral QHS  . fluticasone furoate-vilanterol  1 puff Inhalation Daily  . isosorbide mononitrate  15 mg Oral Daily  . lamoTRIgine  200 mg Oral QHS  . loratadine  10 mg Oral QPM  . mouth rinse  15 mL Mouth Rinse q12n4p  . methylPREDNISolone (SOLU-MEDROL) injection  40 mg Intravenous Q12H  . metoprolol succinate  25 mg Oral Daily  . pantoprazole  40 mg Oral Daily  . potassium chloride  20 mEq Oral Daily  . QUEtiapine  150 mg Oral QHS  . risperiDONE  2 mg Oral QHS  . senna-docusate  1 tablet Oral BID  . sodium chloride flush  3 mL Intravenous Q12H  . sodium chloride flush  3 mL Intravenous Q12H  . venlafaxine XR  150 mg Oral QHS  . vitamin B-12  500 mcg Oral Daily   Continuous Infusions: . sodium chloride 10 mL/hr at 03/24/20 1153   PRN Meds: sodium chloride, acetaminophen, albuterol, ALPRAZolam, ipratropium-albuterol, nitroGLYCERIN, ondansetron (ZOFRAN) IV, polyvinyl alcohol, sodium chloride flush   Vital Signs    Vitals:   03/28/20 0019 03/28/20 0600 03/28/20 0837 03/28/20 0907  BP: (!) 84/52 (!) 87/77  90/66  Pulse: 77 82  86  Resp: 15 17    Temp: 98.2 F (36.8 C) 98.9 F (37.2 C)    TempSrc: Axillary Oral    SpO2: 94% 97% 98% 96%  Weight:  101.1 kg    Height:        Intake/Output  Summary (Last 24 hours) at 03/28/2020 1013 Last data filed at 03/27/2020 1950 Gross per 24 hour  Intake 120 ml  Output 1200 ml  Net -1080 ml   Last 3 Weights 03/28/2020 03/27/2020 03/26/2020  Weight (lbs) 222 lb 14.2 oz 223 lb 15.8 oz 223 lb 8.7 oz  Weight (kg) 101.1 kg 101.6 kg 101.4 kg      Telemetry    NSR - Personally Reviewed  ECG  No new EKG to review - Personally Reviewed  Physical Exam   GEN: Well nourished, well developed in no acute distress HEENT: Normal NECK: No JVD; No carotid bruits LYMPHATICS: No lymphadenopathy CARDIAC:RRR, no murmurs, rubs, gallops RESPIRATORY:  Scattered exp wheezes ABDOMEN: Soft, non-tender, non-distended MUSCULOSKELETAL:  No edema; No deformity  SKIN: Warm and dry NEUROLOGIC:  Alert and oriented x 3 PSYCHIATRIC:  Normal affect    Labs    High Sensitivity Troponin:   Recent Labs  Lab 03/20/20 2328 03/21/20 0128 03/21/20 0529 03/21/20 0735  TROPONINIHS 39* 50* 49* 41*      Chemistry Recent Labs  Lab 03/26/20 0229 03/27/20 0220 03/28/20 0201  NA 137 136 137  K 4.9 4.4 4.4  CL 95*  94* 91*  CO2 33* 35* 41*  GLUCOSE 103* 107* 105*  BUN 16 16 16   CREATININE 0.75 0.79 0.71  CALCIUM 10.3 10.6* 10.5*  GFRNONAA >60 >60 >60  ANIONGAP 9 7 5      Hematology Recent Labs  Lab 03/26/20 0229 03/27/20 0220 03/28/20 0201  WBC 12.3* 10.2 11.7*  RBC 3.65* 3.66* 3.65*  HGB 11.8* 11.5* 11.7*  HCT 36.7 36.9 36.7  MCV 100.5* 100.8* 100.5*  MCH 32.3 31.4 32.1  MCHC 32.2 31.2 31.9  RDW 12.5 12.7 12.7  PLT 317 283 310    BNP No results for input(s): BNP, PROBNP in the last 168 hours.   DDimer No results for input(s): DDIMER in the last 168 hours.   Radiology    No results found.  Cardiac Studies   2D echo  03/2020 IMPRESSIONS   1. Mild hypokinesis of the mid to apical anteroseptum, inferoseptum and  inferior myocardium. Left ventricular ejection fraction, by estimation, is  45 to 50%. The left ventricle has mildly  decreased function. The left  ventricle demonstrates regional wall  motion abnormalities (see scoring diagram/findings for description). Left  ventricular diastolic parameters are consistent with Grade I diastolic  dysfunction (impaired relaxation). Elevated left ventricular end-diastolic  pressure.  2. Right ventricular systolic function is normal. The right ventricular  size is normal. There is normal pulmonary artery systolic pressure.  3. The mitral valve is normal in structure. No evidence of mitral valve  regurgitation. No evidence of mitral stenosis.  4. The aortic valve is normal in structure. Aortic valve regurgitation is  not visualized. No aortic stenosis is present.  5. Aortic dilatation noted. There is mild dilatation of the ascending  aorta, measuring 41 mm.  6. The inferior vena cava is dilated in size with >50% respiratory  variability, suggesting right atrial pressure of 8 mmHg.   Cardiac Cath 03/2020 Conclusion    Ost LM to Mid LM lesion is 60% stenosed.  Mid LAD lesion is 70% stenosed.  The left ventricular systolic function is normal.  The left ventricular ejection fraction is 50-55% by visual estimate.  There is no aortic valve stenosis.  LV end diastolic pressure is moderately elevated.  Hemodynamic findings consistent with mild pulmonary hypertension. Ao sat 93%, PA sat 67%, PA pressure 36/26, mean PA pressure 30 mm Hg, mean PCWP 22 mm Hg; CO 6.27 L/min, CI 3.08  Severe lesion in the right ostial common iliac artery with a 25 mm Hg pressure gradient. AAA present.   Severe, calcific left main disease.  I do not think she will be a candidate for CABG.  Would be high risk PCI and would not pursue this given her lack of angina. Severe PAD as well which limit support options.  Still volume overloaded.  Would continue diuresis and intensify medical therapy for CAD.    Discussed with the son and with Dr. Radford Pax.   Coronary  Diagrams   Diagnostic Dominance: Left   Patient Profile     70 y.o. female with a PMH of chronic diastolic CHF, HLD, COPD on home O2, anxiety, depression, GERD, and PE on Eliquis,who is being seen for the evaluation of acute combined CHF in setting of acute on chronic respiratory failure requiring BiPAP for O2 support.  Assessment & Plan    Acute combined systolic/diastolic CHF - Echo showed EF 45-50% with apical anteroseptum, inferoseptum, and inferior myocardium hypokinesis, new compared to 10/2019. Cath 50-55%.   - I&O's are incomplete - weights are inaccurate  since admit - SOB is multifactorial (COPD/CHF/CAD) - right and left heart cath showed elevated PCWP at 60mmHg mean, LVEPD 41mmHg  - She is still SOB although suspect mainly from COPD exacerbation as she continues to have significant wheezing on exam today - she put out 1.2L yesterday and is net neg 9.6L since admit - SCr stable at 0.71 post cath and with diuresis - CO2 trending up and now 41 with hypotension - hold diuretics  - hold BB due to soft BP - follow strict I&Os, daily weights and renal function while diuresing - consider addition of ARB in outpt setting if BP remains stable  ASCAD -Cath with 60% ostial/proximal LM and 70% LAD -had chest pain post cath resolved with SL NTG and now on Imdur -discussed case with Interventional cardiology.  She is not a good candidate for CABG  due to severity of underlying lung disease. Pulmonary felt at high risk but not absolute contraindication. -will try medical therapy first since she has had minimal angina and if she has recurrent CP then get CVTS consult -felt to be high risk PCI given LM as well as severe PAD which would limit support options for a LM PCI and medical management recommended at this time due to lack of angina -denies any further CP (she only had 1 episode of CP and that was after cath) after addition of long acting nitrate -hold BB due to soft BP -continue  ASA and high dose statin  Hx of PE - restarted apixaban 5mg  BID  HLD - 03/21/2020: Cholesterol 213; HDL 73; LDL Cholesterol 131; Triglycerides 44; VLDL 9  - LDL goal less than 70 - Lipitor increased to 80mg  qd - Lipid panel and LFTS in 6-8 weeks  Acute on chronic respiratory failure/COPD exacerbation - Per primary team  -pulmonary feels at increased perioperative risk due to COPD, smoking, OSA but no absolute contraindication for surgery if risk for respiratory complication outweighed by benefits  I have spent a total of 30 minutes with patient reviewing cardiac cath, 2D echo , telemetry, EKGs, labs and examining patient as well as establishing an assessment and plan that was discussed with the patient.  > 50% of time was spent in direct patient care.    For questions or updates, please contact [place] Please consult [email or web address] for contact info under        Signed, Fransico Him, MD  03/28/2020, 10:13 AM

## 2020-03-28 NOTE — Progress Notes (Signed)
PROGRESS NOTE    Julia Daniels  HCW:[id number] DOB: March 11, 1950 DOA: 03/20/2020 PCP: Simona Huh, NP    Brief Narrative:  Julia Daniels is a 70 year old female with past medical history significant for chronic respiratory failure/COPD on 2-3 L nasal cannula at baseline, anxiety, depression, chronic diastolic congestive heart failure, asthma, history of PE on anticoagulation with Eliquis, chronic back pain, hyperlipidemia, who presented to the ED with progressive shortness of breath.  -Admitted with acute on chronic systolic and diastolic CHF, as well as chest pain -Cath with 60% left main and 70% mid LAD lesion, felt to be a poor candidate for CABG -On medical management for now  Assessment & Plan:   Acute on chronic respiratory failure with hypoxia/hypercarbia, POA Chronic respiratory failure/COPD on 3 L home O2 Acute on chronic combined systolic and diastolic congestive heart failure -Briefly required BiPAP on admission,  -clinically improving with diuresis, she is 8.5 L negative -TTE with LVEF 45-50% with regional wall motion abnormalities -Appreciate cardiology input, right and left heart cath 3/16 noted elevated blood pressure 22, 60% left main and 70% LAD lesion, not felt to be appropriate for PCI and poor CABG candidate, medical management recommended -Remains dyspneic with minimal activity which is multifactorial, continue diuretics 1 more day, suspect CAD and COPD are likely contributing -Today with some expiratory wheezes on exam, will add low-dose steroids to duo nebs -Hold Imdur and Lasix today, with blood pressure in the 80s  CAD -CAD with left main disease as noted above, per cards may be higher risk due to chronic lung disease for CABG, pulmonary input requested,-felt to be at increased perioperative risk but not contraindication -Continue aspirin, started on beta-blocker and statin -Holding nitrates with hypotension  Acute on chronic combined CHF with new  cardiomyopathy TTE with LVEF 45-50%, G1DD. -As noted above, continue IV Lasix  Chronic respiratory failure -On 3 L O2 at baseline -Continue nebs  Pulmonary Embolism On Eliquis outpatient. -Off heparin, continue Eliquis  Hyperlipidemia: Atorvastatin 80 mg p.o. daily  GERD: Protonix 40 mg p.o. daily  Anxiety Depression -Continued on home regimen of Risperdal, Effexor, Lamictal, Seroquel  Morbid obesity Body mass index is 39.48 kg/m.  Discussed with patient needs for aggressive lifestyle changes/weight loss as this complicates all facets of care.  Outpatient follow-up with PCP.      DVT prophylaxis: Eliquis   Code Status: DNR Family Communication: Discussed with patient in detail, no family at bedside  Disposition Plan:  Level of care: Progressive Cardiac Status is: Inpatient  Remains inpatient appropriate because: Severity of illness   Dispo: The patient is from: Home              Anticipated d/c is to: Home              Patient currently is not medically stable to d/c.   Difficult to place patient No   Consultants:   PCCM  Cardiology  Procedures:   TTE  Antimicrobials:   Azithromycin   Subjective: -Breathing improving, blood pressure is low today Objective: Vitals:   03/28/20 0600 03/28/20 0837 03/28/20 0907 03/28/20 1121  BP: (!) 87/77  90/66 129/71  Pulse: 82  86 83  Resp: 17   18  Temp: 98.9 F (37.2 C)   98.4 F (36.9 C)  TempSrc: Oral   Oral  SpO2: 97% 98% 96% 97%  Weight: 101.1 kg     Height:        Intake/Output Summary (Last 24 hours) at 03/28/2020  [place] filed at 03/27/2020 1950 Gross per 24 hour  Intake 120 ml  Output 1200 ml  Net -1080 ml   Filed Weights   03/26/20 0102 03/27/20 0544 03/28/20 0600  Weight: 101.4 kg 101.6 kg 101.1 kg    Examination:  General exam: Pleasant, chronically ill female sitting up in bed, AAOx3, no distress HEENT: Neck obese unable to assess JVD CVS: S1-S2, regular rate rhythm Lungs:  Poor air movement bilaterally, scattered expiratory wheezes Abdomen: Soft, nontender, bowel sounds present Extremities: No edema   Psychiatry:  Mood & affect appropriate.     Data Reviewed: I have personally reviewed following labs and imaging studies  CBC: Recent Labs  Lab 03/24/20 1413 03/25/20 0252 03/26/20 0229 03/27/20 0220 03/28/20 0201  WBC 10.0 10.3 12.3* 10.2 11.7*  HGB 12.1 11.7* 11.8* 11.5* 11.7*  HCT 39.6 36.2 36.7 36.9 36.7  MCV 103.1* 101.4* 100.5* 100.8* 100.5*  PLT 324 306 317 283 725   Basic Metabolic Panel: Recent Labs  Lab 03/24/20 0405 03/24/20 1044 03/24/20 1050 03/24/20 1413 03/25/20 0252 03/26/20 0229 03/27/20 0220 03/28/20 0201  NA 139   < > 139  --  137 137 136 137  K 3.9   < > 3.5  --  4.1 4.9 4.4 4.4  CL 94*  --   --   --  96* 95* 94* 91*  CO2 35*  --   --   --  35* 33* 35* 41*  GLUCOSE 98  --   --   --  118* 103* 107* 105*  BUN 20  --   --   --  16 16 16 16   CREATININE 0.82  --   --  0.82 0.67 0.75 0.79 0.71  CALCIUM 10.4*  --   --   --  9.7 10.3 10.6* 10.5*   < > = values in this interval not displayed.   GFR: Estimated Creatinine Clearance: 77.5 mL/min (by C-G formula based on SCr of 0.71 mg/dL). Liver Function Tests: No results for input(s): AST, ALT, ALKPHOS, BILITOT, PROT, ALBUMIN in the last 168 hours. No results for input(s): LIPASE, AMYLASE in the last 168 hours. No results for input(s): AMMONIA in the last 168 hours. Coagulation Profile: No results for input(s): INR, PROTIME in the last 168 hours. Cardiac Enzymes: No results for input(s): CKTOTAL, CKMB, CKMBINDEX, TROPONINI in the last 168 hours. BNP (last 3 results) No results for input(s): PROBNP in the last 8760 hours. HbA1C: No results for input(s): HGBA1C in the last 72 hours. CBG: No results for input(s): GLUCAP in the last 168 hours. Lipid Profile: No results for input(s): CHOL, HDL, LDLCALC, TRIG, CHOLHDL, LDLDIRECT in the last 72 hours. Thyroid Function  Tests: No results for input(s): TSH, T4TOTAL, FREET4, T3FREE, THYROIDAB in the last 72 hours. Anemia Panel: No results for input(s): VITAMINB12, FOLATE, FERRITIN, TIBC, IRON, RETICCTPCT in the last 72 hours. Sepsis Labs: No results for input(s): PROCALCITON, LATICACIDVEN in the last 168 hours.  Recent Results (from the past 240 hour(s))  Resp Panel by RT-PCR (Flu A&B, Covid) Nasopharyngeal Swab     Status: None   Collection Time: 03/20/20  9:50 PM   Specimen: Nasopharyngeal Swab; Nasopharyngeal(NP) swabs in vial transport medium  Result Value Ref Range Status   SARS Coronavirus 2 by RT PCR NEGATIVE NEGATIVE Final    Comment: (NOTE) SARS-CoV-2 target nucleic acids are NOT DETECTED.  The SARS-CoV-2 RNA is generally detectable in upper respiratory specimens during the acute phase of infection. The  lowest concentration of SARS-CoV-2 viral copies this assay can detect is 138 copies/mL. A negative result does not preclude SARS-Cov-2 infection and should not be used as the sole basis for treatment or other patient management decisions. A negative result may occur with  improper specimen collection/handling, submission of specimen other than nasopharyngeal swab, presence of viral mutation(s) within the areas targeted by this assay, and inadequate number of viral copies(<138 copies/mL). A negative result must be combined with clinical observations, patient history, and epidemiological information. The expected result is Negative.  Fact Sheet for Patients:  EntrepreneurPulse.com.au  Fact Sheet for Healthcare Providers:  IncredibleEmployment.be  This test is no t yet approved or cleared by the [place] FDA and  has been authorized for detection and/or diagnosis of SARS-CoV-2 by FDA under an Emergency Use Authorization (EUA). This EUA will remain  in effect (meaning this test can be used) for the duration of the COVID-19 declaration under Section  564(b)(1) of the Act, 21 U.S.C.section 360bbb-3(b)(1), unless the authorization is terminated  or revoked sooner.       Influenza A by PCR NEGATIVE NEGATIVE Final   Influenza B by PCR NEGATIVE NEGATIVE Final    Comment: (NOTE) The Xpert Xpress SARS-CoV-2/FLU/RSV plus assay is intended as an aid in the diagnosis of influenza from Nasopharyngeal swab specimens and should not be used as a sole basis for treatment. Nasal washings and aspirates are unacceptable for Xpert Xpress SARS-CoV-2/FLU/RSV testing.  Fact Sheet for Patients: EntrepreneurPulse.com.au  Fact Sheet for Healthcare Providers: IncredibleEmployment.be  This test is not yet approved or cleared by the [place] FDA and has been authorized for detection and/or diagnosis of SARS-CoV-2 by FDA under an Emergency Use Authorization (EUA). This EUA will remain in effect (meaning this test can be used) for the duration of the COVID-19 declaration under Section 564(b)(1) of the Act, 21 U.S.C. section 360bbb-3(b)(1), unless the authorization is terminated or revoked.  Performed at [facility], [place] [address], [place], [place] [address]   MRSA PCR Screening     Status: Abnormal   Collection Time: 03/24/20  4:33 AM   Specimen: Nasopharyngeal  Result Value Ref Range Status   MRSA by PCR POSITIVE (A) NEGATIVE Final    Comment:        The GeneXpert MRSA Assay (FDA approved for NASAL specimens only), is one component of a comprehensive MRSA colonization surveillance program. It is not intended to diagnose MRSA infection nor to guide or monitor treatment for MRSA infections. RESULT CALLED TO, READ BACK BY AND VERIFIED WITH: IRIS RN AT [phone] ON 03/24/20 BY S.VANHOORNE Performed at [facility], [place] [address], [place], [place] [address]     Scheduled Meds: . apixaban  5 mg Oral BID  . aspirin EC  81 mg Oral Daily  . atorvastatin  80 mg Oral Daily   . baclofen  5 mg Oral TID  . chlorhexidine  15 mL Mouth Rinse BID  . famotidine  20 mg Oral QHS  . fluticasone furoate-vilanterol  1 puff Inhalation Daily  . lamoTRIgine  200 mg Oral QHS  . loratadine  10 mg Oral QPM  . mouth rinse  15 mL Mouth Rinse q12n4p  . methylPREDNISolone (SOLU-MEDROL) injection  40 mg Intravenous Q12H  . pantoprazole  40 mg Oral Daily  . QUEtiapine  150 mg Oral QHS  . risperiDONE  2 mg Oral QHS  . senna-docusate  1 tablet Oral BID  . sodium chloride flush  3 mL  Intravenous Q12H  . sodium chloride flush  3 mL Intravenous Q12H  . venlafaxine XR  150 mg Oral QHS  . vitamin B-12  500 mcg Oral Daily   Continuous Infusions: . sodium chloride 10 mL/hr at 03/24/20 1153     LOS: 7 days    Time spent: 65min   Domenic Polite, MD [facility]  03/28/2020, 11:22 AM

## 2020-03-28 NOTE — TOC Progression Note (Signed)
Transition of Care [facility]) - Progression Note    Patient Details  Name: Julia Daniels MRN: [id number] Date of Birth: [birth year]  Transition of Care [facility]) CM/SW Contact  Carles Collet, RN Phone Number: 03/28/2020, 2:28 PM  Clinical Narrative:    Notified by [place], liaison [place], that patient was not approved for charity services due to her income exceeding max limit for charity.     Expected Discharge Plan: [place] Barriers to Discharge: Continued Medical Work up  Expected Discharge Plan and Services Expected Discharge Plan: [place] In-house Referral: Clinical Social Work Discharge Planning Services: CM Consult Post Acute Care Choice: [place] Living arrangements for the past 2 months: Single Family Home                 DME Arranged: Ventilator (Trilogy Vent) DME Agency: [employer] Date DME Agency Contacted: 03/26/20 Time DME Agency Contacted: [phone] Representative spoke with at DME Agency: [place]: RN,Disease Management [place] Agency: [facility] (checking to see if she can get charity care.) Date [place]: 03/26/20 Time [place]: 1700 Representative spoke with at [place]: [place]   Social Determinants of Health ([place]) Interventions    Readmission Risk Interventions Readmission Risk Prevention Plan 03/26/2020  Transportation Screening Complete  Medication Review Press photographer) Complete  PCP or Specialist appointment within 3-5 days of discharge Complete  HRI or [place] Complete  SW Recovery Care/Counseling Consult Complete  [place] Not Applicable  Some recent data might be hidden

## 2020-03-29 DIAGNOSIS — I5023 Acute on chronic systolic (congestive) heart failure: Secondary | ICD-10-CM

## 2020-03-29 LAB — CBC
HCT: 37.5 % (ref 36.0–46.0)
Hemoglobin: 12.2 g/dL (ref 12.0–15.0)
MCH: 32.4 pg (ref 26.0–34.0)
MCHC: 32.5 g/dL (ref 30.0–36.0)
MCV: 99.7 fL (ref 80.0–100.0)
Platelets: 303 10*3/uL (ref 150–400)
RBC: 3.76 MIL/uL — ABNORMAL LOW (ref 3.87–5.11)
RDW: 12.4 % (ref 11.5–15.5)
WBC: 11.8 10*3/uL — ABNORMAL HIGH (ref 4.0–10.5)
nRBC: 0 % (ref 0.0–0.2)

## 2020-03-29 LAB — BASIC METABOLIC PANEL
Anion gap: 6 (ref 5–15)
BUN: 12 mg/dL (ref 8–23)
CO2: 35 mmol/L — ABNORMAL HIGH (ref 22–32)
Calcium: 10.5 mg/dL — ABNORMAL HIGH (ref 8.9–10.3)
Chloride: 95 mmol/L — ABNORMAL LOW (ref 98–111)
Creatinine, Ser: 0.79 mg/dL (ref 0.44–1.00)
GFR, Estimated: >60 mL/min (ref >60)
Glucose, Bld: 142 mg/dL — ABNORMAL HIGH (ref 70–99)
Potassium: 4.9 mmol/L (ref 3.5–5.1)
Sodium: 136 mmol/L (ref 135–145)

## 2020-03-29 MED ORDER — FUROSEMIDE 40 MG PO TABS
40.0000 mg | ORAL_TABLET | Freq: Two times a day (BID) | ORAL | Status: DC
  Administered 2020-03-29 (×2): 40 mg via ORAL
  Filled 2020-03-29 (×2): qty 1

## 2020-03-29 MED ORDER — METOPROLOL SUCCINATE ER 25 MG PO TB24
12.5000 mg | ORAL_TABLET | Freq: Every day | ORAL | Status: DC
  Administered 2020-03-29 – 2020-04-01 (×4): 12.5 mg via ORAL
  Filled 2020-03-29 (×4): qty 1

## 2020-03-29 MED ORDER — PREDNISONE 20 MG PO TABS: 40.0000 mg | ORAL_TABLET | Freq: Every day | ORAL | Status: DC

## 2020-03-29 NOTE — TOC Progression Note (Signed)
Transition of Care [facility]) - Progression Note    Patient Details  Name: Julia Daniels MRN: [id number] Date of Birth: Dec 08, 1950  Transition of Care [facility]) CM/SW [place], [place] Phone Number: 03/29/2020, 4:23 PM  Clinical Narrative:     CSW spoke with patient and patient reports that she confirmed that paperwork has been received by caseworker for patients medicare benefits that was faxed over by CSW.  CSW will continue to follow.    Expected Discharge Plan: [place] Barriers to Discharge: Continued Medical Work up  Expected Discharge Plan and Services Expected Discharge Plan: [place] In-house Referral: Clinical Social Work Discharge Planning Services: CM Consult Post Acute Care Choice: [place] Living arrangements for the past 2 months: Single Family Home                 DME Arranged: Ventilator (Trilogy Vent) DME Agency: [employer] Date DME Agency Contacted: 03/26/20 Time DME Agency Contacted: [phone] Representative spoke with at DME Agency: [place]: RN,Disease Management [place] Agency: [facility] (checking to see if she can get charity care.) Date [place]: 03/26/20 Time [place]: 1700 Representative spoke with at [place]: [place]   Social Determinants of Health ([place]) Interventions    Readmission Risk Interventions Readmission Risk Prevention Plan 03/26/2020  Transportation Screening Complete  Medication Review Press photographer) Complete  PCP or Specialist appointment within 3-5 days of discharge Complete  HRI or [place] Complete  SW Recovery Care/Counseling Consult Complete  [place] Not Applicable  Some recent data might be hidden

## 2020-03-29 NOTE — Progress Notes (Signed)
Progress Note  Patient Name: Julia Daniels Date of Encounter: 03/29/2020  [facility] Cardiologist: Fransico Him, MD   Subjective   Feeling better, less SOB. No further CP. Worried about home health since she has changed insurance.   Inpatient Medications    Scheduled Meds: . apixaban  5 mg Oral BID  . aspirin EC  81 mg Oral Daily  . atorvastatin  80 mg Oral Daily  . baclofen  5 mg Oral TID  . chlorhexidine  15 mL Mouth Rinse BID  . famotidine  20 mg Oral QHS  . fluticasone furoate-vilanterol  1 puff Inhalation Daily  . lamoTRIgine  200 mg Oral QHS  . loratadine  10 mg Oral QPM  . mouth rinse  15 mL Mouth Rinse q12n4p  . methylPREDNISolone (SOLU-MEDROL) injection  40 mg Intravenous Q12H  . pantoprazole  40 mg Oral Daily  . QUEtiapine  150 mg Oral QHS  . risperiDONE  2 mg Oral QHS  . senna-docusate  1 tablet Oral BID  . sodium chloride flush  3 mL Intravenous Q12H  . sodium chloride flush  3 mL Intravenous Q12H  . venlafaxine XR  150 mg Oral QHS  . vitamin B-12  500 mcg Oral Daily   Continuous Infusions: . sodium chloride 10 mL/hr at 03/24/20 1153   PRN Meds: sodium chloride, acetaminophen, albuterol, ALPRAZolam, ipratropium-albuterol, nitroGLYCERIN, ondansetron (ZOFRAN) IV, polyvinyl alcohol, sodium chloride flush   Vital Signs    Vitals:   03/28/20 2357 03/29/20 0531 03/29/20 0750 03/29/20 0811  BP: 103/76 101/83    Pulse: 98 99    Resp: 18 15    Temp: 98.7 F (37.1 C) 98.6 F (37 C)    TempSrc: Axillary Oral    SpO2: 93% 96% 96% 95%  Weight:  98.6 kg    Height:        Intake/Output Summary (Last 24 hours) at 03/29/2020 0915 Last data filed at 03/29/2020 0530 Gross per 24 hour  Intake --  Output 2050 ml  Net -2050 ml   Last 3 Weights 03/29/2020 03/28/2020 03/27/2020  Weight (lbs) 217 lb 6 oz 222 lb 14.2 oz 223 lb 15.8 oz  Weight (kg) 98.6 kg 101.1 kg 101.6 kg      Telemetry    NSR - Personally Reviewed  ECG    NSR- Personally  Reviewed  Physical Exam   GEN: No acute distress.   Neck: No JVD Cardiac: RRR, no murmurs, rubs, or gallops.  Respiratory: Clear to auscultation bilaterally. GI: Soft, nontender, non-distended  MS: No edema; No deformity. Neuro:  Nonfocal  Psych: Normal affect   Labs    High Sensitivity Troponin:   Recent Labs  Lab 03/20/20 2328 03/21/20 0128 03/21/20 0529 03/21/20 0735  TROPONINIHS 39* 50* 49* 41*      Chemistry Recent Labs  Lab 03/27/20 0220 03/28/20 0201 03/29/20 0503  NA 136 137 136  K 4.4 4.4 4.9  CL 94* 91* 95*  CO2 35* 41* 35*  GLUCOSE 107* 105* 142*  BUN 16 16 12   CREATININE 0.79 0.71 0.79  CALCIUM 10.6* 10.5* 10.5*  GFRNONAA >60 >60 >60  ANIONGAP 7 5 6      Hematology Recent Labs  Lab 03/27/20 0220 03/28/20 0201 03/29/20 0503  WBC 10.2 11.7* 11.8*  RBC 3.66* 3.65* 3.76*  HGB 11.5* 11.7* 12.2  HCT 36.9 36.7 37.5  MCV 100.8* 100.5* 99.7  MCH 31.4 32.1 32.4  MCHC 31.2 31.9 32.5  RDW 12.7 12.7 12.4  PLT  283 310 303    BNPNo results for input(s): BNP, PROBNP in the last 168 hours.   DDimer No results for input(s): DDIMER in the last 168 hours.   Radiology    No results found.  Cardiac Studies    Diagnostic Dominance: Left    Patient Profile     70 y.o. female with severe multivessel coronary artery disease on catheterization this admission not a candidate for PCI or CABG.  Medical management.  Assessment & Plan    Severe multivessel coronary artery disease -60% ostial left main 70% LAD. -Continue with optimizing medical therapy as best as possible.  Unfortunately not a candidate for invasive therapies including CABG.  Has severe underlying lung disease. -Had one episode of chest pain after cath.  On long-acting nitrate.  Beta-blocker currently on hold secondary to low BP.  Continue aspirin and high-dose statin.  Acute combined systolic and diastolic heart failure -EF 45 to 50% on echo.  Low normal on catheterization 50 to  55%. -COPD also playing a main role in shortness of breath.  Creatinine stable.  Off beta-blocker secondary to soft BP. -If BP remains stable consider angiotensin receptor blocker in the outpatient setting. --changing to PO lasix 40mg  PO BID (was on QD at home.)  History of PE -Apixaban 5 mg twice a day. No bleeding. On ASA as well.   Hyperlipidemia -LDL 131 -Goal less than 70 LDL atorvastatin increased to 80 once a day.  Repeat lipid panel in 2 to 3 months.  Acute on chronic respiratory failure/COPD exacerbation -Continue management per pulmonary/maintain.  Will go ahead and sign off. Will set up outpatient follow up.   Please let us know if we can be of further assistance.     For questions or updates, please contact [place] Please consult [email or web address] for contact info under        Signed, Candee Furbish, MD  03/29/2020, 9:15 AM

## 2020-03-29 NOTE — TOC Progression Note (Signed)
Transition of Care [facility]) - Progression Note    Patient Details  Name: Julia Daniels MRN: [id number] Date of Birth: [birth year]  Transition of Care [facility]) CM/SW Contact  Graves-Bigelow, Ocie Cornfield, RN Phone Number: 03/29/2020, 1:22 PM  Clinical Narrative:  Patient has filled out COBRA information and son will mail the information on 03-30-20. [employer] will speak with the patient regarding the Trilogy today and see if the patient can assist with payment. Case Manager reached out to the [place] for additional assistance from possible LOG. Patient does not meet the charity criteria for home health services at this time. Case Manager will continue to follow for additional transitions of care needs.    Expected Discharge Plan: [place] Barriers to Discharge: Continued Medical Work up  Expected Discharge Plan and Services Expected Discharge Plan: [place] In-house Referral: Clinical Social Work Discharge Planning Services: CM Consult Post Acute Care Choice: [place] arrangements for the past 2 months: Single Family Home                 DME Arranged: Ventilator (Trilogy Vent) DME Agency: [employer] Date DME Agency Contacted: 03/26/20 Time DME Agency Contacted: [phone] Representative spoke with at DME Agency: [place]: RN,Disease Management [place] Agency: [facility] (checking to see if she can get charity care.) Date [place]: 03/26/20 Time [place]: 1700 Representative spoke with at [place]: [place]   Readmission Risk Interventions Readmission Risk Prevention Plan 03/26/2020  Transportation Screening Complete  Medication Review Press photographer) Complete  PCP or Specialist appointment within 3-5 days of discharge Complete  HRI or [place] Complete  SW Recovery Care/Counseling Consult Complete  [place] Not Applicable  Some recent data might be hidden

## 2020-03-29 NOTE — Progress Notes (Signed)
Occupational Therapy Treatment Patient Details Name: Julia Daniels MRN: [id number] DOB: 1950-02-08 Today's Date: 03/29/2020    History of present illness Julia Daniels is a 70 y.o. female with a known history of anxiety, asthma, COPD on 2 L O2 at home, chronic diastolic CHF, chronic back pain presents to the emergency department for evaluation of shortness of breath. Admitted for acute on chronic respiratory failure 2/2 COPD and CHF exacerbation.   OT comments  Pt making steady progress towards OT goals this session. Session focus on BUE therex to increase strength for higher level functional mobility and BADL tasks. Pt completed therex as indicated below with no reports of increased pain, added 2 new exercises and provided HEP to increase carryover. Pt on 2L during session with SpO2 > 90%. Pt required at least one minute rest break between each 15 rep set. Pt would continue to benefit from skilled occupational therapy while admitted and after d/c to address the below listed limitations in order to improve overall functional mobility and facilitate independence with BADL participation. DC plan remains appropriate, will follow acutely per POC.     Follow Up Recommendations  Home health OT    Equipment Recommendations  None recommended by OT    Recommendations for Other Services      Precautions / Restrictions Precautions Precautions: Fall Precaution Comments: chronic 2L O2 Restrictions Weight Bearing Restrictions: No       Mobility Bed Mobility Overal bed mobility: Needs Assistance Bed Mobility: Supine to Sit     Supine to sit: HOB elevated;Supervision     General bed mobility comments: pt recieved in recliner and left pt up in recliner at end of session    Transfers Overall transfer level: Needs assistance Equipment used: Rolling walker (2 wheeled) Transfers: Sit to/from Stand Sit to Stand: Min guard Stand pivot transfers: Min guard       General transfer  comment: session focus on seated BUE therex    Balance Overall balance assessment: Needs assistance Sitting-balance support: Feet supported;Bilateral upper extremity supported Sitting balance-Leahy Scale: Fair Sitting balance - Comments: BUE support as position of comfort due to SOB.   Standing balance support: During functional activity Standing balance-Leahy Scale: Poor Standing balance comment: reliant on external support                           ADL either performed or assessed with clinical judgement   ADL                                         General ADL Comments: session focus on seated BUE therex with level 2 theraband band to increase strength and ROM for higher level BADLs and functional mobility tasks     Vision       Perception     Praxis      Cognition Arousal/Alertness: Awake/alert Behavior During Therapy: WFL for tasks assessed/performed Overall Cognitive Status: Within Functional Limits for tasks assessed                                          Exercises General Exercises - Upper Extremity Shoulder Flexion: Strengthening;Both;15 reps;Seated;Theraband Theraband Level (Shoulder Flexion): Level 2 (Red) Shoulder Extension: Strengthening;Both;Seated;15 reps;Theraband Theraband Level (Shoulder Extension): Level 2 (Red) Shoulder  Horizontal ABduction: Strengthening;Both;15 reps;Seated;Theraband Theraband Level (Shoulder Horizontal Abduction): Level 2 (Red) Shoulder Horizontal ADduction: Strengthening;Both;15 reps;Seated;Theraband Theraband Level (Shoulder Horizontal Adduction): Level 2 (Red) Elbow Flexion: Strengthening;Both;15 reps;Seated;Theraband Theraband Level (Elbow Flexion): Level 2 (Red) Elbow Extension: Strengthening;Both;15 reps;Seated;Theraband Theraband Level (Elbow Extension): Level 2 (Red) Other Exercises Other Exercises: seated punches with level 2 theraband for strengthening BUEs   Shoulder  Instructions       General Comments pt on 2L [place] with sats >90% throughout session    Pertinent Vitals/ Pain       Pain Assessment: No/denies pain  Home Living                                          Prior Functioning/Environment              Frequency  Min 2X/week        Progress Toward Goals  OT Goals(current goals can now be found in the care plan section)  Progress towards OT goals: Progressing toward goals  Acute Rehab OT Goals Patient Stated Goal: to get better OT Goal Formulation: With patient Time For Goal Achievement: 04/04/20 Potential to Achieve Goals: [place] Discharge plan remains appropriate;Frequency remains appropriate    Co-evaluation                 AM-PAC OT "6 Clicks" Daily Activity     Outcome Measure   Help from another person eating meals?: None Help from another person taking care of personal grooming?: A Little Help from another person toileting, which includes using toliet, bedpan, or urinal?: A Little Help from another person bathing (including washing, rinsing, drying)?: A Lot Help from another person to put on and taking off regular upper body clothing?: A Little Help from another person to put on and taking off regular lower body clothing?: A Lot 6 Click Score: 17    End of Session Equipment Utilized During Treatment: Other (comment) (level 2 theraband)  OT Visit Diagnosis: Unsteadiness on feet (R26.81);Muscle weakness (generalized) (M62.81)   Activity Tolerance Patient tolerated treatment well   Patient Left in chair;with call bell/phone within reach;with chair alarm set   Nurse Communication Mobility status        Time: 2633-3545 OT Time Calculation (min): 21 min  Charges: OT General Charges $OT Visit: 1 Visit OT Treatments $Therapeutic Exercise: 8-22 mins  Harley Alto., COTA/L Acute Rehabilitation Services [phone]    Precious Haws 03/29/2020, 5:06 PM

## 2020-03-29 NOTE — Progress Notes (Signed)
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   Past Medical History:  Anxiety Asthma COPD Depression.    Significant Hospital Events:    Consults:  PCCM  3/13   Procedures:  2d echo 3/13   Significant Diagnostic Tests:  Echo  3/13 >>>1. Mild hypokinesis of the mid to apical anteroseptum, inferoseptum and  inferior myocardium. Left ventricular ejection fraction, by estimation, is  45 to 50%. The left ventricle has mildly decreased function. The left ventricle demonstrates regional wall  motion abnormalities (see scoring diagram/findings for description). Left ventricular diastolic parameters are consistent with Grade I diastolic dysfunction (impaired relaxation). Elevated left ventricular end-diastolic pressure. 2. Right ventricular systolic function is normal.  Micro Data:  Resp viral panel 3/12 neg Sputum  >>>  Antimicrobials:  Zmax  3/13 >>>    Interim History / Subjective:  Has been wearing bipap about 6 hours per night the last 2 nights and thinks it is helping. Breathing is about 50% back to her baseline.   Objective   Blood pressure 101/83, pulse 99, temperature 98.6 F (37 C), temperature source Oral, resp. rate 15, height 5\' 3"  (1.6 m), weight 98.6 kg, SpO2 95 %.        Intake/Output Summary (Last 24  hours) at 03/29/2020 1322 Last data filed at 03/29/2020 0530 Gross per 24 hour  Intake --  Output 2050 ml  Net -2050 ml   Filed Weights   03/27/20 0544 03/28/20 0600 03/29/20 0531  Weight: 101.6 kg 101.1 kg 98.6 kg   Physical Exam: Gen:      Chronically ill appearing woman sitting up in bed in NAD, cushingoid appearance HEENT:  [place]/AT, eyes anicteric Lungs:    Diminished breath sounds bilaterally, no wheezing. Breathing comfortably on 2L [place], no conversational dyspnea.  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  Previous ABG's have documented high PCO2.  Patient would benefit from non-invasive ventilation.  Without this therapy, the patient is at high risk of ending up with worsening symptoms, worsened respiratory failure, need for ER visits and/or recurrent hospitalizations.  Bilevel device unable to adequately support patient's nocturnal ventilation needs.  Patient would benefit  from NIV therapy with set tidal volumes and pressure for home use chronically to prevent progressive decline. Appreciate CM's assistance in finding an option for her. -con't steroids, which is a chronic medication for her -con't inhaled bronchodilators- Breo and duonebs PRN; she should be discharged on Stiolto and budesonide nebs -con't singulair, claritin - con't diuresis -con't bipap whenever sleeping while admitted  Acute HFrEF Coronary artery disease  --Cath 3/16 with LAD 70% stenosis AAA --Not a candidate for CABG due to severe underlying lung disease. Medical management per cardiology.  Chronic steroid dependence -long-term outpatient taper if possible; should not be attempted inpatient  Julian Hy, DO 03/29/20 1:35 PM [facility]  From 7AM- 7PM if no response to pager, please call [phone]. After hours, 7PM- 7AM, please call Elink  [phone].

## 2020-03-29 NOTE — Progress Notes (Signed)
PROGRESS NOTE    Julia Daniels  IFO:[id number] DOB: [birth year] DOA: 03/20/2020 PCP: Simona Huh, NP    Brief Narrative:  Julia Daniels is a 70 year old female with past medical history significant for chronic respiratory failure/COPD on 2-3 L nasal cannula at baseline, anxiety, depression, chronic diastolic congestive heart failure, asthma, history of PE on anticoagulation with Eliquis, chronic back pain, hyperlipidemia, who presented to the ED with progressive shortness of breath.  -Admitted with acute on chronic systolic and diastolic CHF, as well as chest pain -Cath with 60% left main and 70% mid LAD lesion, felt to be a poor candidate for CABG -On medical management for now  Assessment & Plan:   Acute on chronic respiratory failure with hypoxia/hypercarbia, POA Chronic respiratory failure/COPD on 3 L home O2 Acute on chronic combined systolic and diastolic congestive heart failure -Briefly required BiPAP on admission,  -clinically improving with diuresis, she is 11.6 L negative -TTE with LVEF 45-50% with regional wall motion abnormalities -Appreciate cardiology input, right and left heart cath 3/16 noted elevated blood pressure 22, 60% left main and 70% LAD lesion, not felt to be appropriate for PCI and poor CABG candidate, medical management recommended -Remains dyspneic with minimal activity which is multifactorial, suspect CAD and COPD are likely contributing -Yesterday had some expiratory wheezes on exam, added low-dose steroids to duo nebs -Blood pressure was low in the 80s yesterday, held Lasix Imdur and beta-blocker -BP more stable today, restart Toprol at low-dose and po lasix -Discharge planning, needs help and home health services, TOC following -Also needs trilogy nocturnal vent at bedtime  CAD -CAD with left main disease as noted above, per cards may be higher risk due to chronic lung disease for CABG, pulmonary input requested,-felt to be at increased  perioperative risk but not contraindication -Continue aspirin, started on beta-blocker and statin -Holding nitrates with hypotension  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 -As noted above, now on p.o. Lasix  COPD/chronic respiratory failure -On 3 L O2 at baseline -Change IV steroids to prednisone -Continue nebs  Pulmonary Embolism On Eliquis outpatient. -Off heparin, continue Eliquis  Hyperlipidemia: Atorvastatin 80 mg p.o. daily  GERD: Protonix 40 mg p.o. daily  Anxiety Depression -Continued on home regimen of Risperdal, Effexor, Lamictal, Seroquel  Morbid obesity Body mass index is 38.51 kg/m.  Discussed with patient needs for aggressive lifestyle changes/weight loss as this complicates all facets of care.  Outpatient follow-up with PCP.      DVT prophylaxis: Eliquis   Code Status: DNR Family Communication: Discussed with patient in detail, no family at bedside  Disposition Plan:  Level of care: Progressive Cardiac Status is: Inpatient  Remains inpatient appropriate because: Severity of illness   Dispo: The patient is from: Home              Anticipated d/c is to: Home hopefully tomorrow              Patient currently is not medically stable to d/c.   Difficult to place patient No   Consultants:   PCCM  Cardiology  Procedures:   TTE  Antimicrobials:   Azithromycin   Subjective: -Breathing a little better, had a rare twinge of chest pain yesterday Objective: Vitals:   03/28/20 2357 03/29/20 0531 03/29/20 0750 03/29/20 0811  BP: 103/76 101/83    Pulse: 98 99    Resp: 18 15    Temp: 98.7 F (37.1 C) 98.6 F (37 C)  TempSrc: Axillary Oral    SpO2: 93% 96% 96% 95%  Weight:  98.6 kg    Height:        Intake/Output Summary (Last 24 hours) at 03/29/2020 1052 Last data filed at 03/29/2020 0530 Gross per 24 hour  Intake -  Output 2050 ml  Net -2050 ml   Filed Weights   03/27/20 0544 03/28/20  0600 03/29/20 0531  Weight: 101.6 kg 101.1 kg 98.6 kg    Examination:  General exam: Pleasant chronically ill female sitting up in bed, AAOx3, no distress  HEENT: Neck obese unable to assess JVD CVS: S1-S2, regular rate rhythm Lungs: Poor air movement bilaterally, no wheezes today Abdomen: Soft, nontender, bowel sounds present Extremities: No edema Psychiatry:  Mood & affect appropriate.     Data Reviewed: I have personally reviewed following labs and imaging studies  CBC: Recent Labs  Lab 03/25/20 0252 03/26/20 0229 03/27/20 0220 03/28/20 0201 03/29/20 0503  WBC 10.3 12.3* 10.2 11.7* 11.8*  HGB 11.7* 11.8* 11.5* 11.7* 12.2  HCT 36.2 36.7 36.9 36.7 37.5  MCV 101.4* 100.5* 100.8* 100.5* 99.7  PLT 306 317 283 310 130   Basic Metabolic Panel: Recent Labs  Lab 03/25/20 0252 03/26/20 0229 03/27/20 0220 03/28/20 0201 03/29/20 0503  NA 137 137 136 137 136  K 4.1 4.9 4.4 4.4 4.9  CL 96* 95* 94* 91* 95*  CO2 35* 33* 35* 41* 35*  GLUCOSE 118* 103* 107* 105* 142*  BUN 16 16 16 16 12   CREATININE 0.67 0.75 0.79 0.71 0.79  CALCIUM 9.7 10.3 10.6* 10.5* 10.5*   GFR: Estimated Creatinine Clearance: 76.4 mL/min (by C-G formula based on SCr of 0.79 mg/dL). Liver Function Tests: No results for input(s): AST, ALT, ALKPHOS, BILITOT, PROT, ALBUMIN in the last 168 hours. No results for input(s): LIPASE, AMYLASE in the last 168 hours. No results for input(s): AMMONIA in the last 168 hours. Coagulation Profile: No results for input(s): INR, PROTIME in the last 168 hours. Cardiac Enzymes: No results for input(s): CKTOTAL, CKMB, CKMBINDEX, TROPONINI in the last 168 hours. BNP (last 3 results) No results for input(s): PROBNP in the last 8760 hours. HbA1C: No results for input(s): HGBA1C in the last 72 hours. CBG: No results for input(s): GLUCAP in the last 168 hours. Lipid Profile: No results for input(s): CHOL, HDL, LDLCALC, TRIG, CHOLHDL, LDLDIRECT in the last 72  hours. Thyroid Function Tests: No results for input(s): TSH, T4TOTAL, FREET4, T3FREE, THYROIDAB in the last 72 hours. Anemia Panel: No results for input(s): VITAMINB12, FOLATE, FERRITIN, TIBC, IRON, RETICCTPCT in the last 72 hours. Sepsis Labs: No results for input(s): PROCALCITON, LATICACIDVEN in the last 168 hours.  Recent Results (from the past 240 hour(s))  Resp Panel by RT-PCR (Flu A&B, Covid) Nasopharyngeal Swab     Status: None   Collection Time: 03/20/20  9:50 PM   Specimen: Nasopharyngeal Swab; Nasopharyngeal(NP) swabs in vial transport medium  Result Value Ref Range Status   SARS Coronavirus 2 by RT PCR NEGATIVE NEGATIVE Final    Comment: (NOTE) SARS-CoV-2 target nucleic acids are NOT DETECTED.  The SARS-CoV-2 RNA is generally detectable in upper respiratory specimens during the acute phase of infection. The lowest concentration of SARS-CoV-2 viral copies this assay can detect is 138 copies/mL. A negative result does not preclude SARS-Cov-2 infection and should not be used as the sole basis for treatment or other patient management decisions. A negative result may occur with  improper specimen collection/handling, submission of specimen other than nasopharyngeal  swab, presence of viral mutation(s) within the areas targeted by this assay, and inadequate number of viral copies(<138 copies/mL). A negative result must be combined with clinical observations, patient history, and epidemiological information. The expected result is Negative.  Fact Sheet for Patients:  EntrepreneurPulse.com.au  Fact Sheet for Healthcare Providers:  IncredibleEmployment.be  This test is no t yet approved or cleared by the [place] FDA and  has been authorized for detection and/or diagnosis of SARS-CoV-2 by FDA under an Emergency Use Authorization (EUA). This EUA will remain  in effect (meaning this test can be used) for the duration of the COVID-19  declaration under Section 564(b)(1) of the Act, 21 U.S.C.section 360bbb-3(b)(1), unless the authorization is terminated  or revoked sooner.       Influenza A by PCR NEGATIVE NEGATIVE Final   Influenza B by PCR NEGATIVE NEGATIVE Final    Comment: (NOTE) The Xpert Xpress SARS-CoV-2/FLU/RSV plus assay is intended as an aid in the diagnosis of influenza from Nasopharyngeal swab specimens and should not be used as a sole basis for treatment. Nasal washings and aspirates are unacceptable for Xpert Xpress SARS-CoV-2/FLU/RSV testing.  Fact Sheet for Patients: EntrepreneurPulse.com.au  Fact Sheet for Healthcare Providers: IncredibleEmployment.be  This test is not yet approved or cleared by the [place] FDA and has been authorized for detection and/or diagnosis of SARS-CoV-2 by FDA under an Emergency Use Authorization (EUA). This EUA will remain in effect (meaning this test can be used) for the duration of the COVID-19 declaration under Section 564(b)(1) of the Act, 21 U.S.C. section 360bbb-3(b)(1), unless the authorization is terminated or revoked.  Performed at [facility], [place] [address], [place], [place] [address]   MRSA PCR Screening     Status: Abnormal   Collection Time: 03/24/20  4:33 AM   Specimen: Nasopharyngeal  Result Value Ref Range Status   MRSA by PCR POSITIVE (A) NEGATIVE Final    Comment:        The GeneXpert MRSA Assay (FDA approved for NASAL specimens only), is one component of a comprehensive MRSA colonization surveillance program. It is not intended to diagnose MRSA infection nor to guide or monitor treatment for MRSA infections. RESULT CALLED TO, READ BACK BY AND VERIFIED WITH: IRIS RN AT [phone] ON 03/24/20 BY S.VANHOORNE Performed at [facility], [place] [address] [place],  [address]     Scheduled Meds: . apixaban  5 mg Oral BID  . aspirin EC  81 mg Oral Daily  .  atorvastatin  80 mg Oral Daily  . baclofen  5 mg Oral TID  . chlorhexidine  15 mL Mouth Rinse BID  . famotidine  20 mg Oral QHS  . fluticasone furoate-vilanterol  1 puff Inhalation Daily  . furosemide  40 mg Oral BID  . lamoTRIgine  200 mg Oral QHS  . loratadine  10 mg Oral QPM  . mouth rinse  15 mL Mouth Rinse q12n4p  . methylPREDNISolone (SOLU-MEDROL) injection  40 mg Intravenous Q12H  . metoprolol succinate  12.5 mg Oral Daily  . pantoprazole  40 mg Oral Daily  . QUEtiapine  150 mg Oral QHS  . risperiDONE  2 mg Oral QHS  . senna-docusate  1 tablet Oral BID  . sodium chloride flush  3 mL Intravenous Q12H  . sodium chloride flush  3 mL Intravenous Q12H  . venlafaxine XR  150 mg Oral QHS  . vitamin B-12  500 mcg Oral Daily   Continuous Infusions: . sodium  chloride 10 mL/hr at 03/24/20 1153     LOS: 8 days   Time spent: 3min   Domenic Polite, MD [facility]  03/29/2020, 10:52 AM

## 2020-03-29 NOTE — Progress Notes (Signed)
Physical Therapy Treatment Patient Details Name: Julia Daniels MRN: [id number] DOB: 13-Mar-1950 Today's Date: 03/29/2020    History of Present Illness Julia Daniels is a 70 y.o. female with a known history of anxiety, asthma, COPD on 2 L O2 at home, chronic diastolic CHF, chronic back pain presents to the emergency department for evaluation of shortness of breath. Admitted for acute on chronic respiratory failure 2/2 COPD and CHF exacerbation.    PT Comments    Patient progressing well towards PT goals. Reports breathing is better today. Tolerated short bouts of gait with use of RW and seated rest break to recover. 2-3/4 DOE present with activity but Sp02 remained in mid 90s on 2L/min 02 [place]. Pt does not need to walk more than 10' at home to function. Noted to have some BLE weakness but no buckling present. Encouraged sitting in chair for all meals and getting on [facility] when able. Will follow.    Follow Up Recommendations  Home health PT;Supervision - Intermittent     Equipment Recommendations  None recommended by PT    Recommendations for Other Services       Precautions / Restrictions Precautions Precautions: Fall Precaution Comments: chronic 2L O2 Restrictions Weight Bearing Restrictions: No    Mobility  Bed Mobility Overal bed mobility: Needs Assistance Bed Mobility: Supine to Sit     Supine to sit: HOB elevated;Supervision     General bed mobility comments: Use of rail, no assist needed. No dizziness.    Transfers Overall transfer level: Needs assistance Equipment used: Rolling walker (2 wheeled) Transfers: Sit to/from Stand Sit to Stand: Min guard Stand pivot transfers: Min guard       General transfer comment: Min guard for safety to stand from EOB x1, from chair x2. SPT bed to chair with Min guard assist, mostly to manage lines.  Ambulation/Gait Ambulation/Gait assistance: Min guard Gait Distance (Feet): 10 Feet (x2 bouts) Assistive device:  Rolling walker (2 wheeled) Gait Pattern/deviations: Step-through pattern;Decreased stride length;Trunk flexed Gait velocity: decr   General Gait Details: Slow, mildly unsteady gait with use of RW, 1 seated rest break. 2-3/4 DOE. Sp02 remained in mid 90s on 2L/min 02 [place].   Stairs             Wheelchair Mobility    Modified Rankin (Stroke Patients Only)       Balance Overall balance assessment: Needs assistance Sitting-balance support: Feet supported;Bilateral upper extremity supported Sitting balance-Leahy Scale: Fair Sitting balance - Comments: BUE support as position of comfort due to SOB.   Standing balance support: During functional activity Standing balance-Leahy Scale: Poor Standing balance comment: reliant on external support                            Cognition Arousal/Alertness: Awake/alert Behavior During Therapy: WFL for tasks assessed/performed Overall Cognitive Status: Within Functional Limits for tasks assessed                                        Exercises      General Comments        Pertinent Vitals/Pain Pain Assessment: No/denies pain    Home Living                      Prior Function            PT  Goals (current goals can now be found in the care plan section) Progress towards PT goals: Progressing toward goals    Frequency    Min 3X/week      PT Plan Current plan remains appropriate    Co-evaluation              AM-PAC PT "6 Clicks" Mobility   Outcome Measure  Help needed turning from your back to your side while in a flat bed without using bedrails?: A Little Help needed moving from lying on your back to sitting on the side of a flat bed without using bedrails?: A Little Help needed moving to and from a bed to a chair (including a wheelchair)?: A Little Help needed standing up from a chair using your arms (e.g., wheelchair or bedside chair)?: A Little Help needed to walk in  hospital room?: A Little Help needed climbing 3-5 steps with a railing? : A Lot 6 Click Score: 17    End of Session Equipment Utilized During Treatment: Oxygen;Gait belt Activity Tolerance: Patient tolerated treatment well Patient left: in chair;with call bell/phone within reach Nurse Communication: Mobility status PT Visit Diagnosis: Muscle weakness (generalized) (M62.81);Difficulty in walking, not elsewhere classified (R26.2)     Time: 0263-7858 PT Time Calculation (min) (ACUTE ONLY): 31 min  Charges:  $Gait Training: 8-22 mins $Therapeutic Activity: 8-22 mins                     Marisa Severin, PT, DPT Acute Rehabilitation Services Pager [phone] Office [place] 03/29/2020, 2:34 PM

## 2020-03-30 DIAGNOSIS — E662 Morbid (severe) obesity with alveolar hypoventilation: Secondary | ICD-10-CM

## 2020-03-30 LAB — BASIC METABOLIC PANEL
Anion gap: 5 (ref 5–15)
BUN: 10 mg/dL (ref 8–23)
CO2: 37 mmol/L — ABNORMAL HIGH (ref 22–32)
Calcium: 10 mg/dL (ref 8.9–10.3)
Chloride: 96 mmol/L — ABNORMAL LOW (ref 98–111)
Creatinine, Ser: 0.83 mg/dL (ref 0.44–1.00)
GFR, Estimated: >60 mL/min (ref >60)
Glucose, Bld: 110 mg/dL — ABNORMAL HIGH (ref 70–99)
Potassium: 4.1 mmol/L (ref 3.5–5.1)
Sodium: 138 mmol/L (ref 135–145)

## 2020-03-30 LAB — CBC
HCT: 34.8 % — ABNORMAL LOW (ref 36.0–46.0)
Hemoglobin: 11.1 g/dL — ABNORMAL LOW (ref 12.0–15.0)
MCH: 32.2 pg (ref 26.0–34.0)
MCHC: 31.9 g/dL (ref 30.0–36.0)
MCV: 100.9 fL — ABNORMAL HIGH (ref 80.0–100.0)
Platelets: 300 10*3/uL (ref 150–400)
RBC: 3.45 MIL/uL — ABNORMAL LOW (ref 3.87–5.11)
RDW: 12.8 % (ref 11.5–15.5)
WBC: 13.8 10*3/uL — ABNORMAL HIGH (ref 4.0–10.5)
nRBC: 0 % (ref 0.0–0.2)

## 2020-03-30 MED ORDER — FUROSEMIDE 40 MG PO TABS
40.0000 mg | ORAL_TABLET | Freq: Every day | ORAL | Status: DC
  Administered 2020-03-30: 40 mg via ORAL
  Filled 2020-03-30: qty 1

## 2020-03-30 MED ORDER — REVEFENACIN 175 MCG/3ML IN SOLN
175.0000 ug | Freq: Every day | RESPIRATORY_TRACT | Status: DC
  Administered 2020-03-30 – 2020-04-01 (×3): 175 ug via RESPIRATORY_TRACT
  Filled 2020-03-30 (×3): qty 3

## 2020-03-30 MED ORDER — PREDNISONE 20 MG PO TABS
40.0000 mg | ORAL_TABLET | Freq: Every day | ORAL | Status: DC
  Administered 2020-03-30 – 2020-03-31 (×2): 40 mg via ORAL
  Filled 2020-03-30 (×2): qty 2

## 2020-03-30 MED ORDER — PREDNISONE 20 MG PO TABS: 30.0000 mg | ORAL_TABLET | Freq: Every day | ORAL | Status: DC

## 2020-03-30 MED ORDER — FUROSEMIDE 40 MG PO TABS
40.0000 mg | ORAL_TABLET | Freq: Two times a day (BID) | ORAL | Status: DC
  Administered 2020-03-30 – 2020-04-01 (×4): 40 mg via ORAL
  Filled 2020-03-30 (×4): qty 1

## 2020-03-30 NOTE — TOC Progression Note (Signed)
Transition of Care [facility]) - Progression Note    Patient Details  Name: Julia Daniels MRN: [id number] Date of Birth: 02/19/1950  Transition of Care [facility]) CM/SW Contact  Graves-Bigelow, Ocie Cornfield, RN Phone Number: 03/30/2020, 4:27 PM  Clinical Narrative:  [employer] continues to work with the patient regarding the Trilogy Vent. [employer] should have an answer regarding Trilogy on 03-31-20. Patient will not be able to get home health via charity-due to income exceeding the max limit for charity and she will not be able to use COBRA insurance for home health. Patient will have to pay out of pocket for services each discipline, each visit. The patient was currently active with [facility] in the past and Case Manager called them back to see what the cost would be for out of pocket fee and the cost is $145.00 each discipline, each visit. Patient states this will be expensive. Patient will need a home health aide and physical therapy. Patient may need to work with Physical Therapy as much as possible while in the hospital. Case Manager did discuss personal care services with the patient. Case Manager reached out to [facility] and spoke with Liaison Tim. Tim will reach out to the patient in the morning to discuss cost and hours. Patient has oxygen via [employer]. If patient transitions home tomorrow she will need PTAR for transport. Case Manager will continue to follow for additional transition of care needs.   Expected Discharge Plan: [place] Barriers to Discharge: Continued Medical Work up  Expected Discharge Plan and Services Expected Discharge Plan: [place] In-house Referral: Clinical Social Work Discharge Planning Services: CM Consult Post Acute Care Choice: [place] arrangements for the past 2 months: Single Family Home                 DME Arranged: Ventilator (Trilogy Vent) DME Agency: [employer] Date DME Agency Contacted:  03/26/20 Time DME Agency Contacted: [phone] Representative spoke with at DME Agency: [place]: RN,Disease Management [place] Agency: [facility] (checking to see if she can get charity care.) Date [place]: 03/26/20 Time [place]: 1700 Representative spoke with at [place]: [place]  Readmission Risk Interventions Readmission Risk Prevention Plan 03/26/2020  Transportation Screening Complete  Medication Review Press photographer) Complete  PCP or Specialist appointment within 3-5 days of discharge Complete  HRI or [place] Complete  SW Recovery Care/Counseling Consult Complete  [place] Not Applicable  Some recent data might be hidden

## 2020-03-30 NOTE — Progress Notes (Signed)
Pt placed on BIPAP 14/6 with 2 lpm bled in.  Tolerating well.

## 2020-03-30 NOTE — Progress Notes (Signed)
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   Past Medical History:  Anxiety Asthma COPD Depression.    Significant Hospital Events:    Consults:  PCCM  3/13   Procedures:  2d echo 3/13   Significant Diagnostic Tests:  Echo  3/13 >>>1. Mild hypokinesis of the mid to apical anteroseptum, inferoseptum and  inferior myocardium. Left ventricular ejection fraction, by estimation, is  45 to 50%. The left ventricle has mildly decreased function. The left ventricle demonstrates regional wall  motion abnormalities (see scoring diagram/findings for description). Left ventricular diastolic parameters are consistent with Grade I diastolic dysfunction (impaired relaxation). Elevated left ventricular end-diastolic pressure. 2. Right ventricular systolic function is normal.  Micro Data:  Resp viral panel 3/12 neg Sputum  >>>  Antimicrobials:  Zmax  3/13 >>>    Interim History / Subjective:  Feeling worse today, thinks stress is her trigger. Just got a PRN breathing treatment. Son at bedside smelling strongly of tobacco.  Objective   Blood pressure (!) 84/68, pulse 86, temperature 98.4 F (36.9 C), temperature source Oral, resp. rate 17, height 5\' 3"  (1.6 m), weight 100.1 kg, SpO2 96 %.        Intake/Output Summary (Last 24  hours) at 03/30/2020 0851 Last data filed at 03/30/2020 0500 Gross per 24 hour  Intake 120 ml  Output 1100 ml  Net -980 ml   Filed Weights   03/28/20 0600 03/29/20 0531 03/30/20 0339  Weight: 101.1 kg 98.6 kg 100.1 kg   Physical Exam: Gen:     Chronically ill appearing woman sitting up in bed in NAD. Cushingoid appearance. HEENT:  [place]/AT, eyes anicteric Lungs:    Diminished breath sounds bilaterally, no wheezing. No accessory muscle use. 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  Previous ABG's have documented high PCO2.  Patient would benefit from non-invasive ventilation.  Without this therapy, the patient is at high risk of ending up with worsening symptoms, worsened respiratory failure, need for ER visits and/or recurrent hospitalizations.  Bilevel device unable to adequately support patient's nocturnal ventilation needs.  Patient would benefit from NIV therapy with set tidal volumes and pressure for home use chronically to  prevent progressive decline. Appreciate CM's assistance in finding an option for her. She is working on getting outpatient insurance coverage now. -Con't increase dose prednisone daily -Con't inhaled bronchodilators- Breo and duonebs PRN; she should be discharged on PTA Stiolto and budesonide nebs -Con't singulair, claritin daily -Con't diuresis- diamox -Con't bipap whenever sleeping -consider Palliative Care consult this admission for  advanced COPD  Leukocytosis- due to steroids possibly -con't to monitor for signs to suggest infection  Acute HFrEF Coronary artery disease --Cath 3/16 with LAD 70% stenosis AAA --Not a candidate for CABG due to severe underlying lung disease. Medical management per cardiology. -appreciate cardiology's input; con't diuresis  Chronic steroid dependence -long-term outpatient taper has been unsuccessful in the past; should not be attempted inpatient  Julian Hy, DO 03/30/20 11:37 AM [facility]  From 7AM- 7PM if no response to pager, please call [phone]. After hours, 7PM- 7AM, please call [place]  [phone].

## 2020-03-30 NOTE — Progress Notes (Signed)
PROGRESS NOTE    Julia Daniels  PYP:[id number] DOB: March 29, 1950 DOA: 03/20/2020 PCP: Simona Huh, NP    Brief Narrative:  Julia Daniels is a 70 year old female with past medical history significant for chronic respiratory failure/COPD on 2-3 L nasal cannula at baseline, anxiety, depression, chronic diastolic congestive heart failure, asthma, history of PE on Eliquis, chronic back pain, hyperlipidemia, who presented to the ED with progressive shortness of breath.  -Admitted with acute on chronic systolic and diastolic CHF, as well as chest pain -Cath with 60% left main and 70% mid LAD lesion, felt to be a poor candidate for CABG, On medical management for now -Aggressively diuresed, negative 12L -Advanced COPD has also been a concern, pulmonary following as well  Assessment & Plan:   Acute on chronic respiratory failure with hypoxia/hypercarbia, POA Chronic respiratory failure/COPD on 3 L home O2 Acute on chronic combined systolic and diastolic congestive heart failure -Briefly required BiPAP on admission,  -clinically improving with diuresis, she is 12 L negative -TTE with LVEF 45-50% with regional wall motion abnormalities -Appreciate cardiology input, right and left heart cath 3/16 noted elevated blood pressure 22, 60% left main and 70% LAD lesion, not felt to be appropriate for PCI and poor CABG candidate, medical management recommended -Remains dyspneic with minimal activity which is multifactorial, suspect CAD and COPD are likely contributing -Back on steroids for intermittent wheezing, apparently on 10 mg prednisone at baseline -Blood pressure has been running low in the last 24 to 48 hours, diuretics decreased, nitrates stopped, more stable today, restart low-dose beta-blocker -Lasix changed to p.o. 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 -As noted above, now on p.o. Lasix  COPD/chronic respiratory failure -On 3 L O2 at baseline -Change IV steroids to prednisone -Continue nebs  Pulmonary Embolism On Eliquis outpatient. -Off heparin, continue Eliquis  Hyperlipidemia: Atorvastatin 80 mg p.o. daily  GERD: Protonix 40 mg p.o. daily  Anxiety Depression -Continued on home regimen of Risperdal, Effexor, Lamictal, Seroquel  Morbid obesity Body mass index is 39.09 kg/m.  Discussed with patient needs for aggressive lifestyle changes/weight loss as this complicates all facets of care.  Outpatient follow-up with PCP.      DVT prophylaxis: Eliquis   Code Status: DNR Family Communication: Discussed with patient in detail, no family at bedside  Disposition Plan:  Level of care: Progressive Cardiac Status is: Inpatient  Remains inpatient appropriate because: Severity of illness   Dispo: The patient is from: Home              Anticipated d/c is to: Home 1-2days              Patient currently is not medically stable to d/c.   Difficult to place patient No   Consultants:   PCCM  Cardiology  Procedures:   TTE  Antimicrobials:   Azithromycin   Subjective: -Breathing a little better, had a rare twinge  of chest pain yesterday  Objective: Vitals:   03/30/20 0843 03/30/20 0846 03/30/20 0913 03/30/20 1052  BP:   124/66   Pulse:   92   Resp:      Temp:      TempSrc:      SpO2: 96% 96%  91%  Weight:       Height:        Intake/Output Summary (Last 24 hours) at 03/30/2020 1146 Last data filed at 03/30/2020 0500 Gross per 24 hour  Intake 120 ml  Output 1100 ml  Net -980 ml   Filed Weights   03/28/20 0600 03/29/20 0531 03/30/20 0339  Weight: 101.1 kg 98.6 kg 100.1 kg    Examination:  General exam: Pleasant obese chronically ill female sitting up in bed, AAOx3, no distress HEENT: Neck obese unable to assess JVD CVS: S1-S2, regular rate rhythm Lungs: Poor air movement bilaterally, no expiratory wheezes today  Abdomen: Soft, nontender, bowel sounds present Extremities: No edema Psychiatry:  Mood & affect appropriate.     Data Reviewed: I have personally reviewed following labs and imaging studies  CBC: Recent Labs  Lab 03/26/20 0229 03/27/20 0220 03/28/20 0201 03/29/20 0503 03/30/20 0230  WBC 12.3* 10.2 11.7* 11.8* 13.8*  HGB 11.8* 11.5* 11.7* 12.2 11.1*  HCT 36.7 36.9 36.7 37.5 34.8*  MCV 100.5* 100.8* 100.5* 99.7 100.9*  PLT 317 283 310 303 166   Basic Metabolic Panel: Recent Labs  Lab 03/26/20 0229 03/27/20 0220 03/28/20 0201 03/29/20 0503 03/30/20 0230  NA 137 136 137 136 138  K 4.9 4.4 4.4 4.9 4.1  CL 95* 94* 91* 95* 96*  CO2 33* 35* 41* 35* 37*  GLUCOSE 103* 107* 105* 142* 110*  BUN 16 16 16 12 10   CREATININE 0.75 0.79 0.71 0.79 0.83  CALCIUM 10.3 10.6* 10.5* 10.5* 10.0   GFR: Estimated Creatinine Clearance: 74.2 mL/min (by C-G formula based on SCr of 0.83 mg/dL). Liver Function Tests: No results for input(s): AST, ALT, ALKPHOS, BILITOT, PROT, ALBUMIN in the last 168 hours. No results for input(s): LIPASE, AMYLASE in the last 168 hours. No results for input(s): AMMONIA in the last 168 hours. Coagulation Profile: No results for input(s): INR, PROTIME in the last 168 hours. Cardiac Enzymes: No results for input(s): CKTOTAL, CKMB, CKMBINDEX, TROPONINI in the last 168 hours. BNP (last 3 results) No results for input(s): PROBNP in the last 8760  hours. HbA1C: No results for input(s): HGBA1C in the last 72 hours. CBG: No results for input(s): GLUCAP in the last 168 hours. Lipid Profile: No results for input(s): CHOL, HDL, LDLCALC, TRIG, CHOLHDL, LDLDIRECT in the last 72 hours. Thyroid Function Tests: No results for input(s): TSH, T4TOTAL, FREET4, T3FREE, THYROIDAB in the last 72 hours. Anemia Panel: No results for input(s): VITAMINB12, FOLATE, FERRITIN, TIBC, IRON, RETICCTPCT in the last 72 hours. Sepsis Labs: No results for input(s): PROCALCITON, LATICACIDVEN in the last 168 hours.  Recent Results (from the past 240 hour(s))  Resp Panel by RT-PCR (Flu A&B, Covid) Nasopharyngeal Swab     Status: None   Collection Time: 03/20/20  9:50 PM   Specimen: Nasopharyngeal Swab; Nasopharyngeal(NP) swabs in vial transport medium  Result Value Ref Range Status   SARS Coronavirus 2 by RT PCR NEGATIVE NEGATIVE Final    Comment: (NOTE) SARS-CoV-2 target nucleic acids are NOT DETECTED.  The SARS-CoV-2 RNA is generally detectable in upper respiratory specimens during the acute phase of infection. The lowest concentration of SARS-CoV-2 viral copies this assay can  detect is 138 copies/mL. A negative result does not preclude SARS-Cov-2 infection and should not be used as the sole basis for treatment or other patient management decisions. A negative result may occur with  improper specimen collection/handling, submission of specimen other than nasopharyngeal swab, presence of viral mutation(s) within the areas targeted by this assay, and inadequate number of viral copies(<138 copies/mL). A negative result must be combined with clinical observations, patient history, and epidemiological information. The expected result is Negative.  Fact Sheet for Patients:  EntrepreneurPulse.com.au  Fact Sheet for Healthcare Providers:  IncredibleEmployment.be  This test is no t yet approved or cleared by the [place] FDA and  has been authorized for detection and/or diagnosis of SARS-CoV-2 by FDA under an Emergency Use Authorization (EUA). This EUA will remain  in effect (meaning this test can be used) for the duration of the COVID-19 declaration under Section 564(b)(1) of the Act, 21 U.S.C.section 360bbb-3(b)(1), unless the authorization is terminated  or revoked sooner.       Influenza A by PCR NEGATIVE NEGATIVE Final   Influenza B by PCR NEGATIVE NEGATIVE Final    Comment: (NOTE) The Xpert Xpress SARS-CoV-2/FLU/RSV plus assay is intended as an aid in the diagnosis of influenza from Nasopharyngeal swab specimens and should not be used as a sole basis for treatment. Nasal washings and aspirates are unacceptable for Xpert Xpress SARS-CoV-2/FLU/RSV testing.  Fact Sheet for Patients: EntrepreneurPulse.com.au  Fact Sheet for Healthcare Providers: IncredibleEmployment.be  This test is not yet approved or cleared by the [place] FDA and has been authorized for detection and/or diagnosis of SARS-CoV-2 by FDA under an Emergency Use Authorization (EUA). This EUA will remain in effect (meaning this test can be used) for the duration of the COVID-19 declaration under Section 564(b)(1) of the Act, 21 U.S.C. section 360bbb-3(b)(1), unless the authorization is terminated or revoked.  Performed at [facility], [place] [address], [place], [place] [address]   MRSA PCR Screening     Status: Abnormal   Collection Time: 03/24/20  4:33 AM   Specimen: Nasopharyngeal  Result Value Ref Range Status   MRSA by PCR POSITIVE (A) NEGATIVE Final    Comment:        The GeneXpert MRSA Assay (FDA approved for NASAL specimens only), is one component of a comprehensive MRSA colonization surveillance program. It is not intended to diagnose MRSA infection nor to guide or monitor treatment for MRSA infections. RESULT CALLED TO, READ BACK BY AND  VERIFIED WITH: IRIS RN AT [phone] ON 03/24/20 BY S.VANHOORNE Performed at [facility], [place] [address], [place], [place] [address]     Scheduled Meds: . apixaban  5 mg Oral BID  . aspirin EC  81 mg Oral Daily  . atorvastatin  80 mg Oral Daily  . baclofen  5 mg Oral TID  . chlorhexidine  15 mL Mouth Rinse BID  . famotidine  20 mg Oral QHS  . fluticasone furoate-vilanterol  1 puff Inhalation Daily  . furosemide  40 mg Oral BID  . lamoTRIgine  200 mg Oral QHS  . loratadine  10 mg Oral QPM  . mouth rinse  15 mL Mouth Rinse q12n4p  . metoprolol succinate  12.5 mg Oral Daily  . pantoprazole  40 mg Oral Daily  . predniSONE  40 mg Oral Q breakfast  . QUEtiapine  150 mg Oral QHS  . revefenacin  175 mcg Nebulization Daily  . risperiDONE  2 mg Oral QHS  .  senna-docusate  1 tablet Oral BID  . sodium chloride flush  3 mL Intravenous Q12H  . sodium chloride flush  3 mL Intravenous Q12H  . venlafaxine XR  150 mg Oral QHS  . vitamin B-12  500 mcg Oral Daily   Continuous Infusions: . sodium chloride 10 mL/hr at 03/24/20 1153     LOS: 9 days   Time spent: 46min   Domenic Polite, MD [facility]  03/30/2020, 11:46 AM

## 2020-03-30 NOTE — Progress Notes (Signed)
Physical Therapy Treatment Patient Details Name: Julia Daniels MRN: [id number] DOB: [birth year] Today's Date: 03/30/2020    History of Present Illness Julia Daniels is a 70 y.o. female with a known history of anxiety, asthma, COPD on 2 L O2 at home, chronic diastolic CHF, chronic back pain presents to the emergency department for evaluation of shortness of breath. Admitted for acute on chronic respiratory failure 2/2 COPD and CHF exacerbation.    PT Comments    Patient progressing well towards PT goals. Reports feeling anxious with trouble breathing this AM related to stress but breathing improved this afternoon. Improved ambulation distance with min guard assist and use of RW for support. Sp02 remained >89% on 2L/min 02 [place] with 2-3/4 DOE. Less time to recover between exercise bouts today. Encouraged continued mobility and OOB as tolerated. Will follow.   Follow Up Recommendations  Home health PT;Supervision - Intermittent     Equipment Recommendations  None recommended by PT    Recommendations for Other Services       Precautions / Restrictions Precautions Precautions: Fall;Other (comment) Precaution Comments: chronic 2L O2, watch Sp02 Restrictions Weight Bearing Restrictions: No    Mobility  Bed Mobility Overal bed mobility: Needs Assistance Bed Mobility: Supine to Sit     Supine to sit: HOB elevated;Supervision     General bed mobility comments: No assist needed, heavy use of rail.    Transfers Overall transfer level: Needs assistance Equipment used: Rolling walker (2 wheeled) Transfers: Sit to/from Stand Sit to Stand: Min guard         General transfer comment: Min guard for safety. Stood from [employer], transferred to chair post ambulation.  Ambulation/Gait Ambulation/Gait assistance: Min guard Gait Distance (Feet): 24 Feet (x2 bouts) Assistive device: Rolling walker (2 wheeled) Gait Pattern/deviations: Step-through pattern;Decreased stride  length;Trunk flexed Gait velocity: decr   General Gait Details: Slow, mildly unsteady gait with use of RW, 1 seated rest break. 2-3/4 DOE. Sp02 remained >89% on 2L/min 02 [place].   Stairs             Wheelchair Mobility    Modified Rankin (Stroke Patients Only)       Balance Overall balance assessment: Needs assistance Sitting-balance support: Feet supported;Bilateral upper extremity supported Sitting balance-Leahy Scale: Fair Sitting balance - Comments: BUE support as position of comfort due to SOB.   Standing balance support: During functional activity Standing balance-Leahy Scale: Poor Standing balance comment: reliant on UE support                            Cognition Arousal/Alertness: Awake/alert Behavior During Therapy: WFL for tasks assessed/performed Overall Cognitive Status: Within Functional Limits for tasks assessed                                 General Comments: Reports feeling anxious today resulting in trouble breathing when trying to deal with getting paper work done for social security and having everyone come in.      Exercises      General Comments General comments (skin integrity, edema, etc.): Sp02 remained >89% on 2L/min 02 [place] throughout session/activity.      Pertinent Vitals/Pain Pain Assessment: No/denies pain    Home Living                      Prior Function  PT Goals (current goals can now be found in the care plan section) Progress towards PT goals: Progressing toward goals    Frequency    Min 3X/week      PT Plan Current plan remains appropriate    Co-evaluation              AM-PAC PT "6 Clicks" Mobility   Outcome Measure  Help needed turning from your back to your side while in a flat bed without using bedrails?: A Little Help needed moving from lying on your back to sitting on the side of a flat bed without using bedrails?: A Little Help needed moving to and  from a bed to a chair (including a wheelchair)?: A Little Help needed standing up from a chair using your arms (e.g., wheelchair or bedside chair)?: A Little Help needed to walk in hospital room?: A Little Help needed climbing 3-5 steps with a railing? : A Lot 6 Click Score: 17    End of Session Equipment Utilized During Treatment: Oxygen;Gait belt Activity Tolerance: Patient tolerated treatment well Patient left: in chair;with call bell/phone within reach Nurse Communication: Mobility status PT Visit Diagnosis: Muscle weakness (generalized) (M62.81);Difficulty in walking, not elsewhere classified (R26.2)     Time: 8469-6295 PT Time Calculation (min) (ACUTE ONLY): 35 min  Charges:  $Gait Training: 8-22 mins $Therapeutic Exercise: 8-22 mins                     Vale Haven, PT, DPT Acute Rehabilitation Services Pager [phone] Office [phone]       Marcy Panning 03/30/2020, 3:34 PM

## 2020-03-31 ENCOUNTER — Inpatient Hospital Stay (HOSPITAL_COMMUNITY): Payer: Self-pay

## 2020-03-31 DIAGNOSIS — Z7189 Other specified counseling: Secondary | ICD-10-CM

## 2020-03-31 DIAGNOSIS — Z7952 Long term (current) use of systemic steroids: Secondary | ICD-10-CM

## 2020-03-31 DIAGNOSIS — Z515 Encounter for palliative care: Secondary | ICD-10-CM

## 2020-03-31 LAB — BASIC METABOLIC PANEL
Anion gap: 7 (ref 5–15)
BUN: 15 mg/dL (ref 8–23)
CO2: 34 mmol/L — ABNORMAL HIGH (ref 22–32)
Calcium: 10.2 mg/dL (ref 8.9–10.3)
Chloride: 96 mmol/L — ABNORMAL LOW (ref 98–111)
Creatinine, Ser: 0.78 mg/dL (ref 0.44–1.00)
GFR, Estimated: >60 mL/min (ref >60)
Glucose, Bld: 107 mg/dL — ABNORMAL HIGH (ref 70–99)
Potassium: 4.3 mmol/L (ref 3.5–5.1)
Sodium: 137 mmol/L (ref 135–145)

## 2020-03-31 LAB — CBC
HCT: 37.3 % (ref 36.0–46.0)
Hemoglobin: 11.6 g/dL — ABNORMAL LOW (ref 12.0–15.0)
MCH: 31.9 pg (ref 26.0–34.0)
MCHC: 31.1 g/dL (ref 30.0–36.0)
MCV: 102.5 fL — ABNORMAL HIGH (ref 80.0–100.0)
Platelets: 321 10*3/uL (ref 150–400)
RBC: 3.64 MIL/uL — ABNORMAL LOW (ref 3.87–5.11)
RDW: 12.7 % (ref 11.5–15.5)
WBC: 13.9 10*3/uL — ABNORMAL HIGH (ref 4.0–10.5)
nRBC: 0 % (ref 0.0–0.2)

## 2020-03-31 LAB — PROCALCITONIN: Procalcitonin: <0.1 ng/mL

## 2020-03-31 MED ORDER — PREDNISONE 20 MG PO TABS
30.0000 mg | ORAL_TABLET | Freq: Every day | ORAL | Status: DC
  Administered 2020-04-01: 30 mg via ORAL
  Filled 2020-03-31: qty 1

## 2020-03-31 NOTE — Plan of Care (Signed)

## 2020-03-31 NOTE — Progress Notes (Signed)
PROGRESS NOTE    Julia Daniels  SWN:[id number] DOB: 11-19-1950 DOA: 03/20/2020 PCP: Simona Huh, NP     Brief Narrative:  Julia Daniels is a 70 year old female with past medical history significant for chronic respiratory failure/COPD on 2-3 L nasal cannula at baseline, anxiety, depression, chronic diastolic congestive heart failure, asthma, history of PE on Eliquis, chronic back pain, hyperlipidemia, who presented to the ED with progressive shortness of breath. She was admitted with acute on chronic systolic and diastolic CHF, as well as chest pain. She underwent heart cath which showed 60% left main and 70% mid LAD lesion, felt to be a poor candidate for CABG, recommended for medical management. She was diuresed. Pulmonology also consulted for advanced COPD.   New events last 24 hours / Subjective: Feeling better, on 2 L oxygen.  Anxious about going home, has minimal support.  Per TOC, no home health agency has been set up yet, some insurance issues as well.  Unable to discharge home until this has been addressed.  Assessment & Plan:   Active Problems:   Acute on chronic respiratory failure (HCC)   Shortness of breath   Acute pulmonary edema (HCC)   Acute on chronic hypoxemic and hypercarbic respiratory failure secondary to advanced COPD, CHF, OSA/OHS -Requires 3 L oxygen at baseline -Briefly required BiPAP on admission -Will need trilogy nocturnal vent at bedtime -Palliative care care medicine consulted due to advanced COPD, respiratory failure -Pulmonology following -Chest x-ray this morning revealed mild pulmonary edema, improved  Acute on chronic combined systolic and diastolic CHF -Appreciate cardiology, status post heart cath 3/16 -Continue Lasix  CAD -Felt to be poor CABG candidate -Continue aspirin, Toprol, Lipitor  COPD -Continue prednisone  PE -Continue Eliquis  Hyperlipidemia -Continue Lipitor  GERD -Continue  Protonix  Anxiety/depression -Continue Xanax, Risperdal, Effexor, Lamictal, Seroquel  Morbid obesity -Estimated body mass index is 39.79 kg/m as calculated from the following:   Height as of this encounter: 5\' 3"  (1.6 m).   Weight as of this encounter: 101.9 kg. -Patient needs aggressive lifestyle changes, weight loss    DVT prophylaxis:   apixaban (ELIQUIS) tablet 5 mg  Code Status: DNR Family Communication: None at bedside  Disposition Plan:  Status is: Inpatient  Remains inpatient appropriate because:Unsafe d/c plan   Dispo: The patient is from: Home              Anticipated d/c is to: Home              Patient currently is medically stable to d/c. Awaiting home health services    Difficult to place patient No    Consultants:   Pulmonology  Cardiology  Palliative care medicine   Antimicrobials:  Anti-infectives (From admission, onward)   Start     Dose/Rate Route Frequency Ordered Stop   03/22/20 0730  azithromycin (ZITHROMAX) tablet 500 mg       "Followed by" Linked Group Details   500 mg Oral Daily 03/21/20 0638 03/26/20 0959   03/21/20 0330  azithromycin (ZITHROMAX) 500 mg in sodium chloride 0.9 % 250 mL IVPB       "Followed by" Linked Group Details   500 mg 250 mL/hr over 60 Minutes Intravenous Every 24 hours 03/21/20 0638 03/21/20 1149        Objective: Vitals:   03/31/20 0400 03/31/20 0747 03/31/20 0749 03/31/20 0926  BP: 116/72   121/76  Pulse: 78   92  Resp: 18   18  Temp: 98.1 F (36.7  C)   99.3 F (37.4 C)  TempSrc: Axillary   Oral  SpO2: 96% 97% 100% 96%  Weight: 101.9 kg     Height:        Intake/Output Summary (Last 24 hours) at 03/31/2020 1018 Last data filed at 03/31/2020 0900 Gross per 24 hour  Intake 960 ml  Output 2700 ml  Net -1740 ml   Filed Weights   03/29/20 0531 03/30/20 0339 03/31/20 0400  Weight: 98.6 kg 100.1 kg 101.9 kg    Examination:  General exam: Appears calm and comfortable  Respiratory system: Clear  to auscultation anteriorly. Respiratory effort normal. No respiratory distress. No conversational dyspnea.  2 L oxygen Cardiovascular system: S1 & S2 heard, RRR. No murmurs. No pedal edema. Gastrointestinal system: Abdomen is nondistended, soft and nontender. Normal bowel sounds heard. Central nervous system: Alert and oriented. No focal neurological deficits. Speech clear.  Extremities: Symmetric in appearance  Skin: No rashes, lesions or ulcers on exposed skin  Psychiatry: Judgement and insight appear normal. Mood & affect appropriate.   Data Reviewed: I have personally reviewed following labs and imaging studies  CBC: Recent Labs  Lab 03/27/20 0220 03/28/20 0201 03/29/20 0503 03/30/20 0230 03/31/20 0214  WBC 10.2 11.7* 11.8* 13.8* 13.9*  HGB 11.5* 11.7* 12.2 11.1* 11.6*  HCT 36.9 36.7 37.5 34.8* 37.3  MCV 100.8* 100.5* 99.7 100.9* 102.5*  PLT 283 310 303 300 161   Basic Metabolic Panel: Recent Labs  Lab 03/27/20 0220 03/28/20 0201 03/29/20 0503 03/30/20 0230 03/31/20 0214  NA 136 137 136 138 137  K 4.4 4.4 4.9 4.1 4.3  CL 94* 91* 95* 96* 96*  CO2 35* 41* 35* 37* 34*  GLUCOSE 107* 105* 142* 110* 107*  BUN 16 16 12 10 15   CREATININE 0.79 0.71 0.79 0.83 0.78  CALCIUM 10.6* 10.5* 10.5* 10.0 10.2   GFR: Estimated Creatinine Clearance: 77.8 mL/min (by C-G formula based on SCr of 0.78 mg/dL). Liver Function Tests: No results for input(s): AST, ALT, ALKPHOS, BILITOT, PROT, ALBUMIN in the last 168 hours. No results for input(s): LIPASE, AMYLASE in the last 168 hours. No results for input(s): AMMONIA in the last 168 hours. Coagulation Profile: No results for input(s): INR, PROTIME in the last 168 hours. Cardiac Enzymes: No results for input(s): CKTOTAL, CKMB, CKMBINDEX, TROPONINI in the last 168 hours. BNP (last 3 results) No results for input(s): PROBNP in the last 8760 hours. HbA1C: No results for input(s): HGBA1C in the last 72 hours. CBG: No results for input(s):  GLUCAP in the last 168 hours. Lipid Profile: No results for input(s): CHOL, HDL, LDLCALC, TRIG, CHOLHDL, LDLDIRECT in the last 72 hours. Thyroid Function Tests: No results for input(s): TSH, T4TOTAL, FREET4, T3FREE, THYROIDAB in the last 72 hours. Anemia Panel: No results for input(s): VITAMINB12, FOLATE, FERRITIN, TIBC, IRON, RETICCTPCT in the last 72 hours. Sepsis Labs: No results for input(s): PROCALCITON, LATICACIDVEN in the last 168 hours.  Recent Results (from the past 240 hour(s))  MRSA PCR Screening     Status: Abnormal   Collection Time: 03/24/20  4:33 AM   Specimen: Nasopharyngeal  Result Value Ref Range Status   MRSA by PCR POSITIVE (A) NEGATIVE Final    Comment:        The GeneXpert MRSA Assay (FDA approved for NASAL specimens only), is one component of a comprehensive MRSA colonization surveillance program. It is not intended to diagnose MRSA infection nor to guide or monitor treatment for MRSA infections. RESULT CALLED TO, READ  BACK BY AND VERIFIED WITH: IRIS RN AT 5537 ON 03/24/20 BY S.VANHOORNE Performed at [facility], [place] [address] [place], [place] [address]       Radiology Studies: DG CHEST PORT 1 VIEW  Result Date: 03/31/2020 CLINICAL DATA:  70 year old female with shortness of breath. EXAM: PORTABLE CHEST 1 VIEW COMPARISON:  03/20/2020 FINDINGS: The heart size and mediastinal contours are within normal limits. Interval improved aeration. Mild cephalization of pulmonary vasculature. Streaky bibasilar opacities. No evidence of significant pleural effusion. No pneumothorax. The visualized skeletal structures are unremarkable. IMPRESSION: Mild pulmonary edema, improved from comparison. Electronically Signed   By: Ruthann Cancer MD   On: 03/31/2020 09:00      Scheduled Meds: . apixaban  5 mg Oral BID  . aspirin EC  81 mg Oral Daily  . atorvastatin  80 mg Oral Daily  . baclofen  5 mg Oral TID  . chlorhexidine  15 mL Mouth Rinse BID  .  famotidine  20 mg Oral QHS  . fluticasone furoate-vilanterol  1 puff Inhalation Daily  . furosemide  40 mg Oral BID  . lamoTRIgine  200 mg Oral QHS  . loratadine  10 mg Oral QPM  . mouth rinse  15 mL Mouth Rinse q12n4p  . metoprolol succinate  12.5 mg Oral Daily  . pantoprazole  40 mg Oral Daily  . predniSONE  40 mg Oral Q breakfast  . QUEtiapine  150 mg Oral QHS  . revefenacin  175 mcg Nebulization Daily  . risperiDONE  2 mg Oral QHS  . senna-docusate  1 tablet Oral BID  . sodium chloride flush  3 mL Intravenous Q12H  . sodium chloride flush  3 mL Intravenous Q12H  . venlafaxine XR  150 mg Oral QHS  . vitamin B-12  500 mcg Oral Daily   Continuous Infusions: . sodium chloride 10 mL/hr at 03/24/20 1153     LOS: 10 days      Time spent: 35 minutes   Dessa Phi, DO [facility] 03/31/2020, 10:18 AM   Available via Epic secure chat 7am-7pm After these hours, please refer to coverage provider listed on [email or web address]

## 2020-03-31 NOTE — Progress Notes (Signed)
Occupational Therapy Treatment Patient Details Name: Julia Daniels MRN: [id number] DOB: [birth year] Today's Date: 03/31/2020    History of present illness Azalia Neuberger is a 70 y.o. female with a known history of anxiety, asthma, COPD on 2 L O2 at home, chronic diastolic CHF, chronic back pain presents to the emergency department for evaluation of shortness of breath. Admitted for acute on chronic respiratory failure 2/2 COPD and CHF exacerbation.   OT comments  Assisted pt with washing hair in supine. Pt transferred to chair with supervision, assisted to change gown, for pericare and to comb hair with set up to supervision. VSS on 2L 02.  Follow Up Recommendations  Home health OT    Equipment Recommendations  None recommended by OT    Recommendations for Other Services      Precautions / Restrictions Precautions Precautions: Fall Precaution Comments: chronic 2L O2, watch Sp02 Restrictions Weight Bearing Restrictions: No       Mobility Bed Mobility Overal bed mobility: Modified Independent             General bed mobility comments: cues to slow pace    Transfers Overall transfer level: Needs assistance Equipment used: Rolling walker (2 wheeled) Transfers: Sit to/from Stand;Stand Pivot Transfers Sit to Stand: Supervision Stand pivot transfers: Supervision            Balance Overall balance assessment: Needs assistance   Sitting balance-Leahy Scale: Good     Standing balance support: Single extremity supported Standing balance-Leahy Scale: Poor Standing balance comment: one hand on walker during pericare                           ADL either performed or assessed with clinical judgement   ADL Overall ADL's : Needs assistance/impaired                 Upper Body Dressing : Set up;Sitting           Toileting- Clothing Manipulation and Hygiene: Supervision/safety;Sit to/from stand         General ADL Comments: washed  pt's hair in supine     Vision       Perception     Praxis      Cognition Arousal/Alertness: Awake/alert Behavior During Therapy: WFL for tasks assessed/performed Overall Cognitive Status: Within Functional Limits for tasks assessed                                          Exercises     Shoulder Instructions       General Comments      Pertinent Vitals/ Pain       Pain Assessment: Faces Faces Pain Scale: Hurts even more Pain Location: back Pain Descriptors / Indicators: Aching Pain Intervention(s): Monitored during session;Repositioned  Home Living                                          Prior Functioning/Environment              Frequency  Min 2X/week        Progress Toward Goals  OT Goals(current goals can now be found in the care plan section)  Progress towards OT goals: Progressing toward goals  Acute Rehab OT Goals Patient Stated  Goal: to get better OT Goal Formulation: With patient Time For Goal Achievement: 04/04/20 Potential to Achieve Goals: [place] Discharge plan remains appropriate;Frequency remains appropriate    Co-evaluation                 AM-PAC OT "6 Clicks" Daily Activity     Outcome Measure   Help from another person eating meals?: None Help from another person taking care of personal grooming?: A Little Help from another person toileting, which includes using toliet, bedpan, or urinal?: A Little Help from another person bathing (including washing, rinsing, drying)?: A Little Help from another person to put on and taking off regular upper body clothing?: A Little Help from another person to put on and taking off regular lower body clothing?: A Little 6 Click Score: 19    End of Session Equipment Utilized During Treatment: Rolling walker;Oxygen (2L)  OT Visit Diagnosis: Unsteadiness on feet (R26.81);Muscle weakness (generalized) (M62.81)   Activity Tolerance Patient  tolerated treatment well   Patient Left in chair;with call bell/phone within reach   Nurse Communication          Time: 1100-1134 OT Time Calculation (min): 34 min  Charges: OT General Charges $OT Visit: 1 Visit OT Treatments $Self Care/Home Management : 23-37 mins  Nestor Lewandowsky, OTR/L Acute Rehabilitation Services Pager: [phone] Office: [phone]   Malka So 03/31/2020, 11:42 AM

## 2020-03-31 NOTE — Progress Notes (Signed)
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   Past Medical History:  Anxiety Asthma COPD Depression  Significant Hospital Events:    Consults:  PCCM  3/13   Procedures:  2d echo 3/13  [facility] 3/16  Significant Diagnostic Tests:  Echo  3/13 >>>1. Mild hypokinesis of the mid to apical anteroseptum, inferoseptum and  inferior myocardium. Left ventricular ejection fraction, by estimation, is  45 to 50%. The left ventricle has mildly decreased function. The left ventricle demonstrates regional wall  motion abnormalities (see scoring diagram/findings for description). Left ventricular diastolic parameters are consistent with Grade I diastolic dysfunction (impaired relaxation). Elevated left ventricular end-diastolic pressure. 2. Right ventricular systolic function is normal.  Micro Data:  Resp viral panel 3/12 neg Sputum  >>>  Antimicrobials:  Zmax  3/13 >>>  Interim History / Subjective:  Today her breathing is feeling better compared to yesterday. Still some dyspnea with talking.  Objective   Blood pressure 116/72, pulse 78, temperature 98.1 F (36.7 C), temperature source Axillary, resp. rate 18, height 5\' 3"  (1.6 m), weight 101.9 kg, SpO2 100 %.        Intake/Output Summary (Last 24 hours) at 03/31/2020 0827 Last  data filed at 03/31/2020 0300 Gross per 24 hour  Intake 720 ml  Output 3200 ml  Net -2480 ml   Filed Weights   03/29/20 0531 03/30/20 0339 03/31/20 0400  Weight: 98.6 kg 100.1 kg 101.9 kg   Physical Exam: Gen:     Chronically ill appearing woman laying in bed in NAD HEENT:  [place]/AT, eyes anicteric Lungs:    Mild tachypnea, no accessory muscle use. Faint scattered wheezing, no rhonchi. CV:         S1S2, RRR Abd:      Obese, soft, NT Ext:    No leg edema, no cyanosis.  Skin:      Thin skin, some bruising, no rashes. 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  Previous ABG's have documented high PCO2.  Patient would benefit from non-invasive ventilation.  Without this therapy, the patient is at high risk of ending up with worsening symptoms, worsened respiratory failure, need for ER visits and/or recurrent hospitalizations.  Bilevel device unable to adequately support patient's nocturnal ventilation needs.  Patient would benefit from NIV therapy with set tidal volumes and pressure for  home use chronically to prevent progressive decline. Appreciate CM's assistance in working with [employer] to get this set up for her. She is working on getting outpatient nsurance coverage now. -Con't increased dose prednisone daily> transition to 30 mg daily starting tomorrow to start slow taper. -Con't inhaled bronchodilators- Breo and duonebs PRN; she should be discharged on PTA Stiolto and budesonide  nebs. -Con't singulair, claritin daily -Con't diuresis- diamox -Con't bipap whenever sleeping. -Appreciate Palliative Care team's input -will need follow up with Dr. Melvyn Novas 2 weeks after discharge  Leukocytosis- due to steroids possibly. Neg PCT. -CXR and PCT today- no signs of pulmonary infection.  Acute HFrEF Coronary artery disease --Cath 3/16 with LAD 70% stenosis AAA --Not a candidate for CABG due to severe underlying lung disease. Medical management per cardiology. -appreciate cardiology's input; con't diuresis, strict I/Os  Chronic steroid dependence -long-term outpatient taper has been unsuccessful in the past; start prednisone taper slowly to 30mg  daily. Would decrease dose weekly.  Julian Hy, DO 03/31/20 2:12 PM [facility]  From 7AM- 7PM if no response to pager, please call [phone]. After hours, 7PM- 7AM, please call Elink  [phone].

## 2020-03-31 NOTE — Consult Note (Signed)
Consultation Note Date: 03/31/2020   Patient Name: Julia Daniels  DOB: Dec 25, 1950  MRN: [id number]  Age / Sex: 70 y.o., female  PCP: Simona Huh, NP Referring Physician: Dessa Phi, DO  Reason for Consultation: goals of care  HPI/Patient Profile: 70 y.o. female  with past medical history of COPD on 2L oxygen at home, chronic diastolic CHF, anxiety, asthma, history of PE on Eliquis, and chronic back pain who presented to the emergency department on 03/20/2020 with shortness of breath.  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  Julia "[name]" is known to PMT - she was followed by our service during her hospitalization in September 2021. Reviewed Palliative Medicine is specialized medical care for people living with serious illness. It focuses on providing relief from the symptoms and stress of a serious illness.   We discussed a brief life review of the patient. 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 She has 2 sons - Julia Daniels and Julia Daniels. Julia Daniels lives with her and is very involved with her care; however it is difficult for him because he also works full-time. She also has 2 grandchildren who means the world to her, although she doesn't get to see them as often as she would like.   After her hospitalization in September 2021, patient was discharged to rehab at [facility] and remained there until February. She reports not feeling ready to leave rehab but unfortunately ran out of insurance covered days and therefore has recently returned home. As far as functional status, she can ambulate with a walker but only for a very short distance because she becomes very dyspneic with even minimal exertion. She is able to perform ADLs, but reports it "takes awhile" due to dyspnea.   We discussed her current illness and what it means in the larger context of her ongoing co-morbidities.  Natural disease trajectory of advanced COPD and heart failure was discussed. Patient is very knowledgeable of her disease process and understands it is not curable.   I attempted to elicit values and goals of care important to the patient. Her goal is to be able to ambulate again with a walker. She is also hopeful to obtain insurance approval for the trilogy vent. She feels this will give her more time with her family and improve her quality of life.    Hospice and Palliative Care services outpatient were explained and offered. She is knowledgeable about the benefits of hospice services and indicates she is open to hospice when the time  comes. I did explain that hospice would not cover use of the trilogy vent. For now she is agreeable to outpatient palliative care.   Advanced directives, concepts specific to code status, artifical feeding and hydration, and rehospitalization were briefly discussed. I reviewed with patient the MOST form on file from September 2021 and confirmed that it remains consistent with her wishes.     Cardiopulmonary Resuscitation: Do Not Attempt Resuscitation (DNR/No CPR)  Medical Interventions: Limited Additional Interventions: Use medical treatment, IV fluids and cardiac monitoring as indicated. DO NOT USE intubation or mechanical ventilation. May consider use of less invasive airway support such as BiPAP or CPAP. Also provide comfort measures. Transfer to the  hospital if indicated. Avoid intensive care.   Antibiotics: - Antibiotic if indicated  IV Fluids: - IV fluids if indicated  Feeding Tube: - Feeding tube for a defined trial period     Patient has advanced directive documents on file in [place]. HCPOA is Julia Daniels (son), with alternate HCPOA listed as Julia Daniels (brother). She has a living will indicating she would not want life-prolonging measures if she suffers from advanced dementia, becomes unconscious, or has a condition that cannot be cured and will result in death within a relatively short period of time.   Questions and concerns were addressed.  The patient was encouraged to call with questions or concerns.   Primary decision maker: Patient  SUMMARY OF RECOMMENDATIONS    DNR/DNI as previously documented  Continue current medical treatment  Goal of care is to be able to ambulate with a walker  Patient is hopeful to obtain insurance approval for trilogy vent  Recommend outpatient palliative at discharge - patient is agreeable and has been previously enrolled with [facility]   Code Status/Advance Care Planning:  DNR  Symptom Management:   Alprazolam (Xanax) 0.25 mg 2 times daily PRN anxiety  Palliative Prophylaxis:   Turn Reposition  Additional Recommendations (Limitations, Scope, Preferences):  Full Scope Treatment and No Tracheostomy  Psycho-social/Spiritual:   Created space and opportunity for patient to express thoughts and feelings regarding patient's current medical situation.   Emotional support provided   Prognosis:   Difficult to determine  Discharge Planning: home with outpatient palliative follow-up      Primary Diagnoses: Present on Admission: . Acute on chronic respiratory failure ([place])   I have reviewed the medical record, interviewed the patient and family, and examined the patient. The following aspects are pertinent.  Past Medical History:  Diagnosis Date  . Anxiety   .  Asthma   . COPD (chronic obstructive pulmonary disease) ([place])   . Depression     Family History  Problem Relation Age of Onset  . Asthma Mother   . Lung cancer Father   . COPD Father    Scheduled Meds: . apixaban  5 mg Oral BID  . aspirin EC  81 mg Oral Daily  . atorvastatin  80 mg Oral Daily  . baclofen  5 mg Oral TID  . chlorhexidine  15 mL Mouth Rinse BID  . famotidine  20 mg Oral QHS  . fluticasone furoate-vilanterol  1 puff Inhalation Daily  . furosemide  40 mg Oral BID  . lamoTRIgine  200 mg Oral QHS  . loratadine  10 mg Oral QPM  . mouth rinse  15 mL Mouth Rinse q12n4p  . metoprolol succinate  12.5 mg Oral Daily  . pantoprazole  40 mg Oral Daily  . predniSONE  40 mg Oral Q breakfast  . QUEtiapine  150 mg Oral QHS  . revefenacin  175 mcg Nebulization Daily  . risperiDONE  2 mg Oral QHS  . senna-docusate  1 tablet Oral BID  . sodium chloride flush  3 mL Intravenous Q12H  . sodium chloride flush  3 mL Intravenous Q12H  . venlafaxine XR  150 mg Oral QHS  . vitamin B-12  500 mcg Oral Daily   Continuous Infusions: . sodium chloride 10 mL/hr at 03/24/20 1153   PRN Meds:.sodium chloride, acetaminophen, albuterol, ALPRAZolam, ipratropium-albuterol, nitroGLYCERIN, ondansetron (ZOFRAN) IV, polyvinyl alcohol, sodium chloride flush Medications Prior to Admission:  Prior to Admission medications   Medication Sig Start Date End Date Taking? Authorizing Provider  albuterol (PROAIR HFA) 108 (90 Base) MCG/ACT inhaler Inhale 2 puffs into the lungs every 6 (six) hours as needed for wheezing or shortness of breath. 07/25/19  Yes Tanda Rockers, MD  albuterol (PROVENTIL) (2.5 MG/3ML) 0.083% nebulizer solution Take 3 mLs (2.5 mg total) by nebulization every 6 (six) hours as needed for wheezing or shortness of breath. 09/10/19  Yes Tanda Rockers, MD  ALPRAZolam Duanne Moron) 0.25 MG tablet Take 0.25 mg by mouth 2 (two) times daily as needed for anxiety. 03/03/20  Yes [provider]   apixaban (ELIQUIS) 5 MG TABS tablet Take 1 tablet (5 mg total) by mouth 2 (two) times daily. 12/12/19  Yes Antonieta Pert, MD  atorvastatin (LIPITOR) 40 MG tablet Take 1 tablet (40 mg total) by mouth daily. 09/12/19 12/04/19 Yes Florencia Reasons, MD  baclofen (LIORESAL) 10 MG tablet Take 10 mg by mouth 3 (three) times daily.  09/05/19  Yes [provider]  Baclofen 5 MG TABS Take 2.5-5 mg by mouth 2 (two) times daily as needed for pain. 03/01/20  Yes [provider]  budesonide (PULMICORT) 0.5 MG/2ML nebulizer solution Take 2 mLs (0.5 mg total) by nebulization 2 (two) times daily. 11/20/19  Yes Tanda Rockers, MD  Cholecalciferol (VITAMIN D3) 1.25 MG (50000 UT) CAPS Take 1 capsule by mouth once a week. 02/10/20  Yes [provider]  famotidine (PEPCID) 20 MG tablet One at bedtime Patient taking differently: Take 20 mg by mouth at bedtime. 02/23/20  Yes Tanda Rockers, MD  FASENRA 30 MG/ML SOSY INJECT 1 SYRINGE UNDER THE SKIN EVERY 8 WEEKS. Patient taking differently: Inject into the skin every 8 (eight) weeks. 02/10/20  Yes Tanda Rockers, MD  furosemide (LASIX) 40 MG tablet 1 tablet daily (79m total); May take additional tablet (448m in the afternoon prn leg swelling/weight gain Patient taking differently: Take 40 mg by mouth daily as needed for fluid. 03/15/20  Yes WaMartyn EhrichNP  guaiFENesin (MUCINEX) 600 MG 12 hr tablet Take 1 tablet (600 mg total) by mouth 2 (two) times daily. Patient taking differently: Take 600 mg by mouth 2 (two) times daily as needed for cough. 09/12/19  Yes XuFlorencia ReasonsMD  HYDROcodone-acetaminophen (NORCO/VICODIN) 5-325 MG tablet Take 1 tablet by mouth every 6 (six) hours as needed for up to 4 doses for severe pain. 12/12/19  Yes KcAntonieta PertMD  ibuprofen (ADVIL) 200 MG tablet Take 600 mg by mouth every 6 (six) hours as needed for fever, headache or mild pain.   Yes [provider]  lamoTRIgine (LAMICTAL) 200 MG tablet Take 200 mg by mouth at bedtime.     Yes [provider]  lidocaine (LIDODERM) 5 % Place 1 patch onto the skin daily. Remove & Discard patch within 12 hours or as directed by MD  09/12/19  Yes Florencia Reasons, MD  loratadine (CLARITIN) 10 MG tablet Take 10 mg by mouth every evening.    Yes [provider]  pantoprazole (PROTONIX) 40 MG tablet Take 1 tablet (40 mg total) by mouth daily. Take 30-60 min before first meal of the day 07/25/19  Yes Tanda Rockers, MD  risperiDONE (RISPERDAL) 1 MG tablet Take 2 mg by mouth at bedtime.   Yes [provider]  Tiotropium Bromide-Olodaterol (STIOLTO RESPIMAT) 2.5-2.5 MCG/ACT AERS Inhale 2 puffs into the lungs daily. 11/20/19  Yes Tanda Rockers, MD  venlafaxine XR (EFFEXOR-XR) 150 MG 24 hr capsule Take 150 mg by mouth at bedtime.    Yes [provider]  vitamin B-12 (CYANOCOBALAMIN) 500 MCG tablet Take 1 tablet (500 mcg total) by mouth daily. 11/11/19  Yes Dana Allan I, MD  barrier cream (NON-SPECIFIED) CREA Apply 1 application topically daily as needed (apply to rash). 11/10/19   Bonnell Public, MD  Infant [employer] Products [facility]) OINT Apply 1 application topically daily as needed (rash).    [provider]  montelukast (SINGULAIR) 10 MG tablet Take 1 tablet (10 mg total) by mouth at bedtime. Patient not taking: No sig reported 02/23/20   Tanda Rockers, MD  potassium chloride (KLOR-CON) 10 MEQ tablet Take 10 mEq by mouth 2 (two) times daily. 03/01/20   [provider]  Potassium Chloride ER 20 MEQ TBCR TAKE 2 TABLETS BY MOUTH DAILY FOR 3 DAYS( WHILE ON HIGHER DOSE LASIX) 03/10/20   Martyn Ehrich, NP  predniSONE (DELTASONE) 10 MG tablet Take 1 tablet (10 mg total) by mouth daily with breakfast. Patient not taking: No sig reported 12/13/19   Antonieta Pert, MD  QUEtiapine (SEROQUEL) 100 MG tablet Take 1.5 tablets (150 mg total) by mouth at bedtime. 11/10/19   Bonnell Public, MD  QUEtiapine (SEROQUEL) 50 MG tablet Take 150 mg by mouth at  bedtime. 03/04/20   [provider]  senna-docusate (SENOKOT-S) 8.6-50 MG tablet Take 1 tablet by mouth 2 (two) times daily. 09/12/19   Florencia Reasons, MD   Allergies  Allergen Reactions  . Sulfa Antibiotics Other (See Comments)    Mouth gets raw   Review of Systems  Neurological: Positive for weakness.    Physical Exam Vitals reviewed.  Constitutional:      General: She is not in acute distress.    Comments: Chronically ill-appearing  Cardiovascular:     Rate and Rhythm: Normal rate and regular rhythm.  Pulmonary:     Effort: Pulmonary effort is normal.  Neurological:     Mental Status: She is alert and oriented to person, place, and time.     Motor: Weakness present.     Vital Signs: BP 116/72 (BP Location: Left Wrist)   Pulse 78   Temp 98.1 F (36.7 C) (Axillary)   Resp 18   Ht _0  (1.6 m)   Wt 101.9 kg   SpO2 100%   BMI 39.79 kg/m  Pain Scale: 0-10 POSS *See Group Information*: 1-Acceptable,Awake and alert Pain Score: 1    SpO2: SpO2: 100 % O2 Device:SpO2: 100 % O2 Flow Rate: .O2 Flow Rate (L/min): 2 L/min  IO: Intake/output summary:   Intake/Output Summary (Last 24 hours) at 03/31/2020 0928 Last data filed at 03/31/2020 0300 Gross per 24 hour  Intake 720 ml  Output 3200 ml  Net -2480 ml    LBM: Last BM Date: 03/28/20 Baseline Weight: Weight: 90.3 kg Most recent weight: Weight:  101.9 kg      Palliative Assessment/Data: PPS 40-50%    Time In: 10:50 Time Out: 12:02 Time Total: 72 minutes Greater than 50%  of this time was spent counseling and coordinating care related to the above assessment and plan.  Signed by: Lavena Bullion, NP   Please contact Palliative Medicine Team phone at [phone] for questions and concerns.  For individual provider: See Shea Evans

## 2020-03-31 NOTE — TOC Progression Note (Addendum)
Transition of Care [facility]) - Progression Note    Patient Details  Name: Julia Daniels MRN: [id number] Date of Birth: 07-21-1950  [facility] CM/SW Contact  Zenon Mayo, RN Phone Number: 03/31/2020, 1:28 PM  Clinical Narrative:    NCM spoke to patient , she states there will be no one at her home today, but her son will be there tomorrow.  NIV  Is being set up by [employer],  Thedore Mins is awaiting card on file.  Patient states she will have [facility] come out on Friday which will be $25.00/hr, her hours will be 9am -3 pm Friday only per Tim.   Expected Discharge Plan: [place] Barriers to Discharge: Continued Medical Work up  Expected Discharge Plan and Services Expected Discharge Plan: [place] In-house Referral: Clinical Social Work Discharge Planning Services: CM Consult Post Acute Care Choice: [place] Living arrangements for the past 2 months: Single Family Home                 DME Arranged: Ventilator (Trilogy Vent) DME Agency: [employer] Date DME Agency Contacted: 03/26/20 Time DME Agency Contacted: [phone] Representative spoke with at DME Agency: [place]: RN,Disease Management [place] Agency: [facility] (checking to see if she can get charity care.) Date [place]: 03/26/20 Time [place]: 1700 Representative spoke with at [place]: [place]   Social Determinants of Health ([place]) Interventions    Readmission Risk Interventions Readmission Risk Prevention Plan 03/26/2020  Transportation Screening Complete  Medication Review Press photographer) Complete  PCP or Specialist appointment within 3-5 days of discharge Complete  HRI or [place] Complete  SW Recovery Care/Counseling Consult Complete  [place] Not Applicable  Some recent data might be hidden

## 2020-04-01 ENCOUNTER — Telehealth: Payer: Self-pay | Admitting: Critical Care Medicine

## 2020-04-01 DIAGNOSIS — I2510 Atherosclerotic heart disease of native coronary artery without angina pectoris: Secondary | ICD-10-CM

## 2020-04-01 DIAGNOSIS — I2699 Other pulmonary embolism without acute cor pulmonale: Secondary | ICD-10-CM

## 2020-04-01 DIAGNOSIS — E662 Morbid (severe) obesity with alveolar hypoventilation: Secondary | ICD-10-CM

## 2020-04-01 LAB — CBC
HCT: 35.2 % — ABNORMAL LOW (ref 36.0–46.0)
Hemoglobin: 11.2 g/dL — ABNORMAL LOW (ref 12.0–15.0)
MCH: 32.1 pg (ref 26.0–34.0)
MCHC: 31.8 g/dL (ref 30.0–36.0)
MCV: 100.9 fL — ABNORMAL HIGH (ref 80.0–100.0)
Platelets: 308 10*3/uL (ref 150–400)
RBC: 3.49 MIL/uL — ABNORMAL LOW (ref 3.87–5.11)
RDW: 12.7 % (ref 11.5–15.5)
WBC: 12.8 10*3/uL — ABNORMAL HIGH (ref 4.0–10.5)
nRBC: 0 % (ref 0.0–0.2)

## 2020-04-01 LAB — PROCALCITONIN: Procalcitonin: <0.1 ng/mL

## 2020-04-01 MED ORDER — METOPROLOL SUCCINATE ER 25 MG PO TB24: 12.5000 mg | ORAL_TABLET | Freq: Every day | ORAL | 2 refills | Status: AC

## 2020-04-01 MED ORDER — FUROSEMIDE 40 MG PO TABS: 40.0000 mg | ORAL_TABLET | Freq: Two times a day (BID) | ORAL | 2 refills | Status: AC

## 2020-04-01 MED ORDER — ASPIRIN 81 MG PO TBEC: 81.0000 mg | DELAYED_RELEASE_TABLET | Freq: Every day | ORAL | 2 refills | Status: AC

## 2020-04-01 MED ORDER — POTASSIUM CHLORIDE CRYS ER 10 MEQ PO TBCR: 10.0000 meq | EXTENDED_RELEASE_TABLET | Freq: Two times a day (BID) | ORAL | 2 refills | Status: AC

## 2020-04-01 MED ORDER — MONTELUKAST SODIUM 10 MG PO TABS: 10.0000 mg | ORAL_TABLET | Freq: Every day | ORAL | 2 refills | Status: AC

## 2020-04-01 MED ORDER — PREDNISONE 10 MG PO TABS: ORAL_TABLET | ORAL | 0 refills | Status: DC

## 2020-04-01 MED ORDER — ATORVASTATIN CALCIUM 80 MG PO TABS: 80.0000 mg | ORAL_TABLET | Freq: Every day | ORAL | 2 refills | Status: AC

## 2020-04-01 NOTE — Discharge Summary (Signed)
Physician Discharge Summary  Julia Daniels [facility]:[id number] DOB: 12/18/1950 DOA: 03/20/2020  PCP: Simona Huh, NP  Admit date: 03/20/2020 Discharge date: 04/01/2020  Admitted From: Home Disposition:  Home   Recommendations for Outpatient Follow-up:  1. Follow up with PCP in 1 week 2. Follow up with Dr. Melvyn Novas pulmonology in 2 weeks  3. Follow up with Dr. Radford Pax Cardiology on 04/28/20  4. Follow-up with outpatient palliative care  Discharge Condition: Stable CODE STATUS: DNR  Diet recommendation:  Diet Orders (From admission, onward)    Start     Ordered   03/24/20 1345  Diet Heart Room service appropriate? Yes; Fluid consistency: Thin  Diet effective now       Question Answer Comment  Room service appropriate? Yes   Fluid consistency: Thin      03/24/20 1344         Brief/Interim Summary: Julia Daniels is a 70 year old female with past medical history significant for chronic respiratory failure/COPD on 2-3 L nasal cannula at baseline, anxiety, depression, chronic diastolic congestive heart failure, asthma, history of PE on Eliquis, chronic back pain, hyperlipidemia, who presented to the ED with progressive shortness of breath. She was admitted with acute on chronic systolic and diastolic CHF, as well as chest pain. She underwent heart cath which showed 60% left main and 70% mid LAD lesion, felt to be a poor candidate for CABG,recommended for medical management. She was diuresed. Pulmonology also consulted for advanced COPD. Patient continued to make slow clinical improvement, she was discharged home on PO lasix and prolonged steroid taper.   Discharge Diagnoses:  Principal Problem:   Acute on chronic respiratory failure with hypoxia (HCC) Active Problems:   Bipolar disorder, unspecified (HCC)   Hyperlipidemia   Depression   Anxiety   COPD exacerbation (HCC)   Allergic rhinitis   Debility   Supplemental oxygen dependent   Goals of care, counseling/discussion   DNR  (do not resuscitate)   Acute on chronic diastolic CHF (congestive heart failure) (HCC)   Shortness of breath   Acute pulmonary edema (HCC)   CAD (coronary artery disease)   Pulmonary embolism (HCC)   Obesity hypoventilation syndrome (HCC)   Acute on chronic hypoxemic and hypercarbic respiratory failure secondary to advanced COPD, CHF, OSA/OHS -Requires 3 L oxygen at baseline -Briefly required BiPAP on admission -Will need trilogy nocturnal vent at bedtime, appreciate TOC help  -Palliative care care medicine consulted due to advanced COPD, respiratory failure -Pulmonology following -Repeat CXR revealed mild pulmonary edema, improved  Acute on chronic combined systolic and diastolic CHF -Appreciate cardiology, status post heart cath 3/16 -Continue Lasix  CAD -Felt to be poor CABG candidate -Continue aspirin, Toprol, Lipitor  COPD -Continue prednisone, prolonged taper   PE -Continue Eliquis  Hyperlipidemia -Continue Lipitor  GERD -Continue Protonix  Anxiety/depression/bipolar disorder -Continue Xanax, Risperdal, Effexor, Lamictal, Seroquel  Morbid obesity -Estimated body mass index is 39.79 kg/m as calculated from the following:   Height as of this encounter: 5\' 3"  (1.6 m).   Weight as of this encounter: 101.9 kg. -Patient needs aggressive lifestyle changes, weight loss   Discharge Instructions  Discharge Instructions    (HEART FAILURE PATIENTS) Call MD:  Anytime you have any of the following symptoms: 1) 3 pound weight gain in 24 hours or 5 pounds in 1 week 2) shortness of breath, with or without a dry hacking cough 3) swelling in the hands, feet or stomach 4) if you have to sleep on extra pillows at night in  order to breathe.   Complete by: As directed    Call MD for:  difficulty breathing, headache or visual disturbances   Complete by: As directed    Call MD for:  extreme fatigue   Complete by: As directed    Call MD for:  persistant dizziness or  light-headedness   Complete by: As directed    Call MD for:  persistant nausea and vomiting   Complete by: As directed    Call MD for:  severe uncontrolled pain   Complete by: As directed    Call MD for:  temperature >100.4   Complete by: As directed    Discharge instructions   Complete by: As directed    You were cared for by a hospitalist during your hospital stay. If you have any questions about your discharge medications or the care you received while you were in the hospital after you are discharged, you can call the unit and ask to speak with the hospitalist on call if the hospitalist that took care of you is not available. Once you are discharged, your primary care physician will handle any further medical issues. Please note that NO REFILLS for any discharge medications will be authorized once you are discharged, as it is imperative that you return to your primary care physician (or establish a relationship with a primary care physician if you do not have one) for your aftercare needs so that they can reassess your need for medications and monitor your lab values.   Increase activity slowly   Complete by: As directed      Allergies as of 04/01/2020      Reactions   Sulfa Antibiotics Other (See Comments)   Mouth gets raw      Medication List    STOP taking these medications   HYDROcodone-acetaminophen 5-325 MG tablet Commonly known as: NORCO/VICODIN   ibuprofen 200 MG tablet Commonly known as: ADVIL   Potassium Chloride ER 20 MEQ Tbcr     TAKE these medications   albuterol 108 (90 Base) MCG/ACT inhaler Commonly known as: ProAir HFA Inhale 2 puffs into the lungs every 6 (six) hours as needed for wheezing or shortness of breath.   albuterol (2.5 MG/3ML) 0.083% nebulizer solution Commonly known as: PROVENTIL Take 3 mLs (2.5 mg total) by nebulization every 6 (six) hours as needed for wheezing or shortness of breath.   ALPRAZolam 0.25 MG tablet Commonly known as:  XANAX Take 0.25 mg by mouth 2 (two) times daily as needed for anxiety.   apixaban 5 MG Tabs tablet Commonly known as: ELIQUIS Take 1 tablet (5 mg total) by mouth 2 (two) times daily.   aspirin 81 MG EC tablet Take 1 tablet (81 mg total) by mouth daily. Swallow whole. Start taking on: April 02, 2020   atorvastatin 80 MG tablet Commonly known as: Lipitor Take 1 tablet (80 mg total) by mouth daily. What changed:   medication strength  how much to take   baclofen 10 MG tablet Commonly known as: LIORESAL Take 10 mg by mouth 3 (three) times daily.   Baclofen 5 MG Tabs Take 2.5-5 mg by mouth 2 (two) times daily as needed for pain.   barrier cream Crea Commonly known as: non-specified Apply 1 application topically daily as needed (apply to rash).   budesonide 0.5 MG/2ML nebulizer solution Commonly known as: Pulmicort Take 2 mLs (0.5 mg total) by nebulization 2 (two) times daily.   Dermacloud Oint Apply 1 application topically daily as needed (  rash).   famotidine 20 MG tablet Commonly known as: PEPCID One at bedtime What changed:   how much to take  how to take this  when to take this  additional instructions   Fasenra 30 MG/ML Sosy Generic drug: Benralizumab INJECT 1 SYRINGE UNDER THE SKIN EVERY 8 WEEKS. What changed: See the new instructions.   furosemide 40 MG tablet Commonly known as: LASIX Take 1 tablet (40 mg total) by mouth 2 (two) times daily. What changed:   how much to take  how to take this  when to take this  additional instructions   guaiFENesin 600 MG 12 hr tablet Commonly known as: MUCINEX Take 1 tablet (600 mg total) by mouth 2 (two) times daily. What changed:   when to take this  reasons to take this   lamoTRIgine 200 MG tablet Commonly known as: LAMICTAL Take 200 mg by mouth at bedtime.   lidocaine 5 % Commonly known as: LIDODERM Place 1 patch onto the skin daily. Remove & Discard patch within 12 hours or as directed by MD    loratadine 10 MG tablet Commonly known as: CLARITIN Take 10 mg by mouth every evening.   metoprolol succinate 25 MG 24 hr tablet Commonly known as: TOPROL-XL Take 0.5 tablets (12.5 mg total) by mouth daily. Start taking on: April 02, 2020   montelukast 10 MG tablet Commonly known as: SINGULAIR Take 1 tablet (10 mg total) by mouth at bedtime.   pantoprazole 40 MG tablet Commonly known as: Protonix Take 1 tablet (40 mg total) by mouth daily. Take 30-60 min before first meal of the day   potassium chloride 10 MEQ tablet Commonly known as: KLOR-CON Take 1 tablet (10 mEq total) by mouth 2 (two) times daily. Take with lasix. What changed: additional instructions   predniSONE 10 MG tablet Commonly known as: DELTASONE Take 30mg  daily for 1 week, then taper down to 20mg  daily for 1 week. Follow up with pulmonology for further tapering doses. What changed:   how much to take  how to take this  when to take this  additional instructions   QUEtiapine 50 MG tablet Commonly known as: SEROQUEL Take 150 mg by mouth at bedtime. What changed: Another medication with the same name was removed. Continue taking this medication, and follow the directions you see here.   risperiDONE 1 MG tablet Commonly known as: RISPERDAL Take 2 mg by mouth at bedtime.   senna-docusate 8.6-50 MG tablet Commonly known as: Senokot-S Take 1 tablet by mouth 2 (two) times daily.   Stiolto Respimat 2.5-2.5 MCG/ACT Aers Generic drug: Tiotropium Bromide-Olodaterol Inhale 2 puffs into the lungs daily.   venlafaxine XR 150 MG 24 hr capsule Commonly known as: EFFEXOR-XR Take 150 mg by mouth at bedtime.   vitamin B-12 500 MCG tablet Commonly known as: CYANOCOBALAMIN Take 1 tablet (500 mcg total) by mouth daily.   Vitamin D3 1.25 MG (50000 UT) Caps Take 1 capsule by mouth once a week.       Follow-up Information    Sueanne Margarita, MD Follow up on 04/28/2020.   Specialty: Cardiology Why: 9:20 am for  hospital follow up Contact information: [address] [address] [phone]        [facility] [place] Follow up.   Specialty: Home Health Services Why: Physical Therapy only- office to call the patient regarding visit times.  Contact information: [address] [place] [address] [phone]  [facility] Follow up.   Specialty: PALLIATIVE CARE Why: Outpatient Palliative Services arranged for outpatient- palliative services-office to call with visit times.  Contact information: [place] [address] [phone]       Tanda Rockers, MD. Schedule an appointment as soon as possible for a visit in 2 week(s).   Specialty: Pulmonary Disease Contact information: [place] [address] [phone]        Simona Huh, NP. Schedule an appointment as soon as possible for a visit in 1 week(s).   Specialty: Nurse Practitioner Contact information: [place] [address] [phone]              Allergies  Allergen Reactions  . Sulfa Antibiotics Other (See Comments)    Mouth gets raw    Consultations:  Pulmonology  Cardiology  Palliative care medicine   Procedures/Studies: DG Chest 2 View  Result Date: 03/20/2020 CLINICAL DATA:  Short of breath, asthma, COPD EXAM: CHEST - 2 VIEW COMPARISON:  03/05/2020 FINDINGS: Frontal and lateral views of the chest demonstrates stable enlargement of the cardiac silhouette. Chronic ectasia of the thoracic aorta. Chronic areas of scarring are seen throughout the lungs. There is central vascular congestion, with increased interstitial prominence compatible with interstitial edema. No acute airspace disease, effusion, or pneumothorax. No acute bony abnormalities. IMPRESSION: 1. Worsening volume status, with central vascular congestion and interstitial edema. Electronically Signed   By: Randa Ngo  M.D.   On: 03/20/2020 22:35   DG Chest 2 View  Result Date: 03/05/2020 CLINICAL DATA:  Shortness of breath EXAM: CHEST - 2 VIEW COMPARISON:  12/04/2019 FINDINGS: Small bilateral pleural effusions. Linear scarring or atelectasis within the mid to lower lungs. No consolidation or pneumothorax. Borderline to mild cardiomegaly. IMPRESSION: Small bilateral pleural effusions with linear scarring or atelectasis in the mid to lower lungs. Electronically Signed   By: Donavan Foil M.D.   On: 03/05/2020 16:17   CARDIAC CATHETERIZATION  Result Date: 03/24/2020  Ost LM to Mid LM lesion is 60% stenosed.  Mid LAD lesion is 70% stenosed.  The left ventricular systolic function is normal.  The left ventricular ejection fraction is 50-55% by visual estimate.  There is no aortic valve stenosis.  LV end diastolic pressure is moderately elevated.  Hemodynamic findings consistent with mild pulmonary hypertension. Ao sat 93%, PA sat 67%, PA pressure 36/26, mean PA pressure 30 mm Hg, mean PCWP 22 mm Hg; CO 6.27 L/min, CI 3.08  Severe lesion in the right ostial common iliac artery with a 25 mm Hg pressure gradient. AAA present.  Severe, calcific left main disease.  I do not think she will be a candidate for CABG.  Would be high risk PCI and would not pursue this given her lack of angina. Severe PAD as well which limit support options.  Still volume overloaded.  Would continue diuresis and intensify medical therapy for CAD.  Discussed with the son and with Dr. Radford Pax.   PERIPHERAL VASCULAR CATHETERIZATION  Result Date: 03/24/2020  Ost LM to Mid LM lesion is 60% stenosed.  Mid LAD lesion is 70% stenosed.  The left ventricular systolic function is normal.  The left ventricular ejection fraction is 50-55% by visual estimate.  There is no aortic valve stenosis.  LV end diastolic pressure is moderately elevated.  Hemodynamic findings consistent with mild pulmonary hypertension. Ao sat 93%, PA sat 67%, PA pressure  36/26, mean PA pressure 30 mm Hg, mean  PCWP 22 mm Hg; CO 6.27 L/min, CI 3.08  Severe lesion in the right ostial common iliac artery with a 25 mm Hg pressure gradient. AAA present.  Severe, calcific left main disease.  I do not think she will be a candidate for CABG.  Would be high risk PCI and would not pursue this given her lack of angina. Severe PAD as well which limit support options.  Still volume overloaded.  Would continue diuresis and intensify medical therapy for CAD.  Discussed with the son and with Dr. Radford Pax.   DG CHEST PORT 1 VIEW  Result Date: 03/31/2020 CLINICAL DATA:  70 year old female with shortness of breath. EXAM: PORTABLE CHEST 1 VIEW COMPARISON:  03/20/2020 FINDINGS: The heart size and mediastinal contours are within normal limits. Interval improved aeration. Mild cephalization of pulmonary vasculature. Streaky bibasilar opacities. No evidence of significant pleural effusion. No pneumothorax. The visualized skeletal structures are unremarkable. IMPRESSION: Mild pulmonary edema, improved from comparison. Electronically Signed   By: Ruthann Cancer MD   On: 03/31/2020 09:00   ECHOCARDIOGRAM COMPLETE  Result Date: 03/21/2020    ECHOCARDIOGRAM REPORT   Patient Name:   Julia Daniels Date of Exam: 03/21/2020 Medical Rec #:  [id number]           Height:       63.0 in Accession #:    [id number]          Weight:       230.6 lb Date of Birth:  07/10/1950          BSA:          2.054 m Patient Age:    72 years            BP:           120/84 mmHg Patient Gender: F                   HR:           95 bpm. Exam Location:  Inpatient Procedure: 2D Echo, Cardiac Doppler, Color Doppler and Intracardiac            Opacification Agent Indications:    I50.40* Unspecified combined systolic (congestive) and diastolic                 (congestive) heart failure  History:        Patient has prior history of Echocardiogram examinations, most                 recent 11/04/2019. CHF, COPD,  Signs/Symptoms:Shortness of Breath                 and Dyspnea; Risk Factors:Current Smoker and Hypertension.                 Hypoxia.  Sonographer:    Roseanna Rainbow RDCS Referring Phys: [id number] ALEXIS HUGELMEYER  Sonographer Comments: Technically difficult study due to poor echo windows, suboptimal parasternal window, suboptimal apical window, suboptimal subcostal window and patient is morbidly obese. Image acquisition challenging due to patient body habitus. Patient in high fowler's position. IMPRESSIONS  1. Mild hypokinesis of the mid to apical anteroseptum, inferoseptum and inferior myocardium. Left ventricular ejection fraction, by estimation, is 45 to 50%. The left ventricle has mildly decreased function. The left ventricle demonstrates regional wall  motion abnormalities (see scoring diagram/findings for description). Left ventricular diastolic parameters are consistent with Grade I diastolic dysfunction (impaired relaxation). Elevated left ventricular end-diastolic pressure.  2. Right ventricular systolic  function is normal. The right ventricular size is normal. There is normal pulmonary artery systolic pressure.  3. The mitral valve is normal in structure. No evidence of mitral valve regurgitation. No evidence of mitral stenosis.  4. The aortic valve is normal in structure. Aortic valve regurgitation is not visualized. No aortic stenosis is present.  5. Aortic dilatation noted. There is mild dilatation of the ascending aorta, measuring 41 mm.  6. The inferior vena cava is dilated in size with >50% respiratory variability, suggesting right atrial pressure of 8 mmHg. FINDINGS  Left Ventricle: Mild hypokinesis of the mid to apical anteroseptum, inferoseptum and inferior myocardium. Left ventricular ejection fraction, by estimation, is 45 to 50%. The left ventricle has mildly decreased function. The left ventricle demonstrates regional wall motion abnormalities. Definity contrast agent was given IV to delineate the  left ventricular endocardial borders. The left ventricular internal cavity size was normal in size. There is no left ventricular hypertrophy. Left ventricular diastolic parameters are consistent with Grade I diastolic dysfunction (impaired relaxation). Elevated left ventricular end-diastolic pressure. Right Ventricle: The right ventricular size is normal. No increase in right ventricular wall thickness. Right ventricular systolic function is normal. There is normal pulmonary artery systolic pressure. The tricuspid regurgitant velocity is 1.34 m/s, and  with an assumed right atrial pressure of 8 mmHg, the estimated right ventricular systolic pressure is 32.9 mmHg. Left Atrium: Left atrial size was normal in size. Right Atrium: Right atrial size was normal in size. Pericardium: There is no evidence of pericardial effusion. Mitral Valve: The mitral valve is normal in structure. No evidence of mitral valve regurgitation. No evidence of mitral valve stenosis. Tricuspid Valve: The tricuspid valve is normal in structure. Tricuspid valve regurgitation is trivial. No evidence of tricuspid stenosis. Aortic Valve: The aortic valve is normal in structure. Aortic valve regurgitation is not visualized. No aortic stenosis is present. Pulmonic Valve: The pulmonic valve was normal in structure. Pulmonic valve regurgitation is not visualized. No evidence of pulmonic stenosis. Aorta: Aortic dilatation noted. There is mild dilatation of the ascending aorta, measuring 41 mm. Venous: The inferior vena cava is dilated in size with greater than 50% respiratory variability, suggesting right atrial pressure of 8 mmHg. IAS/Shunts: No atrial level shunt detected by color flow Doppler.  LEFT VENTRICLE PLAX 2D LVIDd:         4.30 cm     Diastology LVIDs:         3.40 cm     LV e' medial:    4.90 cm/s LV PW:         1.30 cm     LV E/e' medial:  14.2 LV IVS:        1.40 cm     LV e' lateral:   8.92 cm/s LVOT diam:     2.10 cm     LV E/e' lateral:  7.8 LV SV:         75 LV SV Index:   37 LVOT Area:     3.46 cm  LV Volumes (MOD) LV vol d, MOD A2C: 76.8 ml LV vol d, MOD A4C: 63.9 ml LV vol s, MOD A2C: 36.7 ml LV vol s, MOD A4C: 27.1 ml LV SV MOD A2C:     40.1 ml LV SV MOD A4C:     63.9 ml LV SV MOD BP:      38.5 ml RIGHT VENTRICLE             IVC RV S prime:  10.30 cm/s  IVC diam: 2.20 cm TAPSE (M-mode): 1.4 cm LEFT ATRIUM             Index       RIGHT ATRIUM           Index LA diam:        2.60 cm 1.27 cm/m  RA Area:     11.90 cm LA Vol (A2C):   25.5 ml 12.41 ml/m RA Volume:   29.50 ml  14.36 ml/m LA Vol (A4C):   17.7 ml 8.62 ml/m LA Biplane Vol: 22.6 ml 11.00 ml/m  AORTIC VALVE LVOT Vmax:   146.00 cm/s LVOT Vmean:  95.700 cm/s LVOT VTI:    0.217 m  AORTA Ao Root diam: 3.20 cm Ao Asc diam:  4.10 cm MITRAL VALVE                TRICUSPID VALVE MV Area (PHT): 3.17 cm     TR Peak grad:   7.2 mmHg MV Decel Time: 239 msec     TR Vmax:        134.00 cm/s MV E velocity: 69.80 cm/s MV A velocity: 111.00 cm/s  SHUNTS MV E/A ratio:  0.63         Systemic VTI:  0.22 m                             Systemic Diam: 2.10 cm Skeet Latch MD Electronically signed by Skeet Latch MD Signature Date/Time: 03/21/2020/1:29:30 PM    Final        Discharge Exam: Vitals:   04/01/20 0734 04/01/20 0827  BP:  100/71  Pulse: 93 100  Resp: 20 18  Temp:  99.1 F (37.3 C)  SpO2:  95%    General: Pt is alert, awake, not in acute distress Cardiovascular: RRR, S1/S2 +, no edema Respiratory: CTA bilaterally, no wheezing, no rhonchi, no respiratory distress, no conversational dyspnea, on 2L O2 Abdominal: Soft, NT, ND, bowel sounds + Extremities: no edema, no cyanosis Psych: Normal mood and affect, stable judgement and insight     The results of significant diagnostics from this hospitalization (including imaging, microbiology, ancillary and laboratory) are listed below for reference.     Microbiology: Recent Results (from the past 240 hour(s))  MRSA PCR  Screening     Status: Abnormal   Collection Time: 03/24/20  4:33 AM   Specimen: Nasopharyngeal  Result Value Ref Range Status   MRSA by PCR POSITIVE (A) NEGATIVE Final    Comment:        The GeneXpert MRSA Assay (FDA approved for NASAL specimens only), is one component of a comprehensive MRSA colonization surveillance program. It is not intended to diagnose MRSA infection nor to guide or monitor treatment for MRSA infections. RESULT CALLED TO, READ BACK BY AND VERIFIED WITH: IRIS RN AT [phone] ON 03/24/20 BY S.VANHOORNE Performed at [facility], [place] [address] [place], [place] [address]      Labs: BNP (last 3 results) Recent Labs    12/04/19 2027 03/05/20 1551 03/20/20 2327  BNP 77.3 90 82.9   Basic Metabolic Panel: Recent Labs  Lab 03/27/20 0220 03/28/20 0201 03/29/20 0503 03/30/20 0230 03/31/20 0214  NA 136 137 136 138 137  K 4.4 4.4 4.9 4.1 4.3  CL 94* 91* 95* 96* 96*  CO2 35* 41* 35* 37* 34*  GLUCOSE 107* 105* 142* 110* 107*  BUN 16 16 12  10  15  CREATININE 0.79 0.71 0.79 0.83 0.78  CALCIUM 10.6* 10.5* 10.5* 10.0 10.2   Liver Function Tests: No results for input(s): AST, ALT, ALKPHOS, BILITOT, PROT, ALBUMIN in the last 168 hours. No results for input(s): LIPASE, AMYLASE in the last 168 hours. No results for input(s): AMMONIA in the last 168 hours. CBC: Recent Labs  Lab 03/28/20 0201 03/29/20 0503 03/30/20 0230 03/31/20 0214 04/01/20 0242  WBC 11.7* 11.8* 13.8* 13.9* 12.8*  HGB 11.7* 12.2 11.1* 11.6* 11.2*  HCT 36.7 37.5 34.8* 37.3 35.2*  MCV 100.5* 99.7 100.9* 102.5* 100.9*  PLT 310 303 300 321 308   Cardiac Enzymes: No results for input(s): CKTOTAL, CKMB, CKMBINDEX, TROPONINI in the last 168 hours. BNP: Invalid input(s): POCBNP CBG: No results for input(s): GLUCAP in the last 168 hours. D-Dimer No results for input(s): DDIMER in the last 72 hours. Hgb A1c No results for input(s): HGBA1C in the last 72 hours. Lipid  Profile No results for input(s): CHOL, HDL, LDLCALC, TRIG, CHOLHDL, LDLDIRECT in the last 72 hours. Thyroid function studies No results for input(s): TSH, T4TOTAL, T3FREE, THYROIDAB in the last 72 hours.  Invalid input(s): FREET3 Anemia work up No results for input(s): VITAMINB12, FOLATE, FERRITIN, TIBC, IRON, RETICCTPCT in the last 72 hours. Urinalysis    Component Value Date/Time   COLORURINE YELLOW 09/10/2019 1302   APPEARANCEUR CLEAR 09/10/2019 1302   LABSPEC 1.018 09/10/2019 1302   PHURINE 5.0 09/10/2019 1302   GLUCOSEU NEGATIVE 09/10/2019 1302   HGBUR NEGATIVE 09/10/2019 1302   BILIRUBINUR NEGATIVE 09/10/2019 1302   BILIRUBINUR n 09/23/2012 1111   KETONESUR NEGATIVE 09/10/2019 1302   PROTEINUR NEGATIVE 09/10/2019 1302   UROBILINOGEN 0.2 09/23/2012 1111   NITRITE NEGATIVE 09/10/2019 1302   LEUKOCYTESUR NEGATIVE 09/10/2019 1302   Sepsis Labs Invalid input(s): PROCALCITONIN,  WBC,  LACTICIDVEN Microbiology Recent Results (from the past 240 hour(s))  MRSA PCR Screening     Status: Abnormal   Collection Time: 03/24/20  4:33 AM   Specimen: Nasopharyngeal  Result Value Ref Range Status   MRSA by PCR POSITIVE (A) NEGATIVE Final    Comment:        The GeneXpert MRSA Assay (FDA approved for NASAL specimens only), is one component of a comprehensive MRSA colonization surveillance program. It is not intended to diagnose MRSA infection nor to guide or monitor treatment for MRSA infections. RESULT CALLED TO, READ BACK BY AND VERIFIED WITH: IRIS RN AT [phone] ON 03/24/20 BY S.VANHOORNE Performed at [facility], [place] [address], [place], [place] [address]      Patient was seen and examined on the day of discharge and was found to be in stable condition. Time coordinating discharge: 35 minutes including assessment and coordination of care, as well as examination of the patient.   SIGNED:  Dessa Phi, DO [facility] 04/01/2020, 10:28 AM

## 2020-04-01 NOTE — Progress Notes (Signed)
NAME:  Julia Daniels, MRN:  [id number], DOB:  July 19, 1950, LOS: 44 ADMISSION DATE:  03/20/2020, CONSULTATION DATE:   3/13 REFERRING MD:  [place]/ [facility]  CHIEF COMPLAINT:  Sob/ acute on chronic resp failure    Brief History:  53 yowf who quit smoking 2019/ MS for alpha one def with GOLD II spirometry 02/2018  labeled as ACOS and followed by Melvyn Novas in Pulmonary clinic with unusual pattern of sudden onset sob labeled as asthma/copd/chf or combination of the above which goes from not needing any saba to being refractory to even multiple neb rx and bipap despite supposed to maint on stiolto/ pred/ fosenra and 02 admitted again 3/12 with exactly same presentation as in past and PCCM service as to consult .   Past Medical History:  Anxiety Asthma COPD Depression  Significant Hospital Events:    Consults:  PCCM  3/13  Palliative Care  Procedures:  2d echo 3/13  L&R [facility] 3/16  Significant Diagnostic Tests:  Echo  3/13 >>>1. Mild hypokinesis of the mid to apical anteroseptum, inferoseptum and  inferior myocardium. Left ventricular ejection fraction, by estimation, is  45 to 50%. The left ventricle has mildly decreased function. The left ventricle demonstrates regional wall  motion abnormalities (see scoring diagram/findings for description). Left ventricular diastolic parameters are consistent with Grade I diastolic dysfunction (impaired relaxation). Elevated left ventricular end-diastolic pressure. 2. Right ventricular systolic function is normal.  Micro Data:  Resp viral panel 3/12 neg Sputum  >>>  Antimicrobials:  Zmax  3/13 >>>  Interim History / Subjective:  Today she feels great- she is back to her baseline.  Objective   Blood pressure 100/71, pulse 100, temperature 99.1 F (37.3 C), temperature source Oral, resp. rate 18, height 5\' 3"  (1.6 m), weight 101.8 kg, SpO2 95 %.        Intake/Output Summary (Last 24 hours) at 04/01/2020 1115 Last data filed at 04/01/2020  0800 Gross per 24 hour  Intake 720 ml  Output 2600 ml  Net -1880 ml   Filed Weights   03/30/20 0339 03/31/20 0400 04/01/20 0707  Weight: 100.1 kg 101.9 kg 101.8 kg   Physical Exam: Gen:     Chronically ill appearing woman laying in bed in NAD HEENT:  [place]/AT, eyes anicteric Lungs:   CTAB- overall reduced breath sounds. No conversational dyspnea. CV:         S1S2, RRR Abd:      Obese, soft, NT Ext:    No LE edema, no clubbing or cyanosis Skin:      Thin skin, some bruising Neuro:    Awake, alert, moving all extremities. Answering questions appropriately, normal speech.  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  Previous ABG's have documented high PCO2.  Patient would benefit from non-invasive ventilation.  Without this therapy, the patient is at high risk of ending up with worsening symptoms, worsened respiratory failure, need for ER visits and/or recurrent hospitalizations.  Bilevel device unable to adequately support patient's nocturnal ventilation needs.  Patient would benefit from NIV therapy with set tidal volumes and pressure for home use chronically to prevent progressive decline. Appreciate CM's  assistance in working with [employer] to get this set up for her. She is working on getting outpatient nsurance coverage now. -Stable for discharge from pulmonary standpoint if she can get her home vent. -Discharge on prednisone 30mg  daily. -Discharged on PTA Stiolto and budesonide nebs. -Con't singulair, claritin daily at home. Will resume Fasenra injections. -Likely needs home diuretic regimen. -Appreciate  Palliative Care team's input. They will be a valuable resource as her disease inevitably worsens over time. -Appt requested for follow up with Dr. Melvyn Novas.  Leukocytosis- due to steroids possibly. Neg PCT x 2 and clinically improving -follow up with PCP  Acute HFrEF Coronary artery disease --Cath 3/16 with LAD 70% stenosis AAA --Not a candidate for CABG due to severe underlying lung disease. Medical management per cardiology. -appreciate cardiology's input this admission  Chronic steroid dependence -home on prednisone 30mg  daily; can be tapered as an outpatient. If needing >20mg  daily for > 86month, then may need chronic PJP prophylaxis.   PCCM will sign off. Please call with questions.  Julian Hy, DO 04/01/20 11:25 AM [facility]  From 7AM- 7PM if no response to pager, please call [phone]. After hours, 7PM- 7AM, please call Elink  [phone].

## 2020-04-01 NOTE — Progress Notes (Signed)
Palliative Medicine RN Note: Rec'd request from NP Elie Confer that I call in [place] referral. I called Chrislyn w [facility] I let her know that pt may not have insurance covg for OP PC.  Marjie Skiff Jewelz Kobus, RN, BSN, [facility] Palliative Medicine Team 04/01/2020 10:43 AM Office [phone]

## 2020-04-01 NOTE — Discharge Instructions (Signed)
Femoral Site Care  This sheet gives you information about how to care for yourself after your procedure. Your health care provider may also give you more specific instructions. If you have problems or questions, contact your health care provider. What can I expect after the procedure? After the procedure, it is common to have:  Bruising that usually fades within 1-2 weeks.  Tenderness at the site. Follow these instructions at home: Wound care  Follow instructions from your health care provider about how to take care of your insertion site. Make sure you: ? Wash your hands with soap and water before you change your bandage (dressing). If soap and water are not available, use hand sanitizer. ? Change your dressing as told by your health care provider. ? Leave stitches (sutures), skin glue, or adhesive strips in place. These skin closures may need to stay in place for 2 weeks or longer. If adhesive strip edges start to loosen and curl up, you may trim the loose edges. Do not remove adhesive strips completely unless your health care provider tells you to do that.  Do not take baths, swim, or use a hot tub until your health care provider approves.  You may shower 24-48 hours after the procedure or as told by your health care provider. ? Gently wash the site with plain soap and water. ? Pat the area dry with a clean towel. ? Do not rub the site. This may cause bleeding.  Do not apply powder or lotion to the site. Keep the site clean and dry.  Check your femoral site every day for signs of infection. Check for: ? Redness, swelling, or pain. ? Fluid or blood. ? Warmth. ? Pus or a bad smell. Activity  For the first 2-3 days after your procedure, or as long as directed: ? Avoid climbing stairs as much as possible. ? Do not squat.  Do not lift anything that is heavier than 10 lb (4.5 kg), or the limit that you are told, until your health care provider says that it is safe.  Rest as  directed. ? Avoid sitting for a long time without moving. Get up to take short walks every 1-2 hours.  Do not drive for 24 hours if you were given a medicine to help you relax (sedative). General instructions  Take over-the-counter and prescription medicines only as told by your health care provider.  Keep all follow-up visits as told by your health care provider. This is important. Contact a health care provider if you have:  A fever or chills.  You have redness, swelling, or pain around your insertion site. Get help right away if:  The catheter insertion area swells very fast.  You pass out.  You suddenly start to sweat or your skin gets clammy.  The catheter insertion area is bleeding, and the bleeding does not stop when you hold steady pressure on the area.  The area near or just beyond the catheter insertion site becomes pale, cool, tingly, or numb. These symptoms may represent a serious problem that is an emergency. Do not wait to see if the symptoms will go away. Get medical help right away. Call your local emergency services (911 in the [place] Do not drive yourself to the hospital. Summary  After the procedure, it is common to have bruising that usually fades within 1-2 weeks.  Check your femoral site every day for signs of infection.  Do not lift anything that is heavier than 10 lb (4.5 kg), or   the limit that you are told, until your health care provider says that it is safe. This information is not intended to replace advice given to you by your health care provider. Make sure you discuss any questions you have with your health care provider. Document Revised: 08/29/2019 Document Reviewed: 08/29/2019 Elsevier Patient Education  [place]  This sheet gives you information about how to care for yourself after your procedure. Your health care provider may also give you more specific instructions. If you have problems or questions, contact your  health care provider. What can I expect after the procedure? After the procedure, it is common to have:  Bruising and tenderness at the catheter insertion area. Follow these instructions at home: Medicines  Take over-the-counter and prescription medicines only as told by your health care provider. Insertion site care  Follow instructions from your health care provider about how to take care of your insertion site. Make sure you: ? Wash your hands with soap and water before you change your bandage (dressing). If soap and water are not available, use hand sanitizer. ? Change your dressing as told by your health care provider. ? Leave stitches (sutures), skin glue, or adhesive strips in place. These skin closures may need to stay in place for 2 weeks or longer. If adhesive strip edges start to loosen and curl up, you may trim the loose edges. Do not remove adhesive strips completely unless your health care provider tells you to do that.  Check your insertion site every day for signs of infection. Check for: ? Redness, swelling, or pain. ? Fluid or blood. ? Pus or a bad smell. ? Warmth.  Do not take baths, swim, or use a hot tub until your health care provider approves.  You may shower 24-48 hours after the procedure, or as directed by your health care provider. ? Remove the dressing and gently wash the site with plain soap and water. ? Pat the area dry with a clean towel. ? Do not rub the site. That could cause bleeding.  Do not apply powder or lotion to the site. Activity  For 24 hours after the procedure, or as directed by your health care provider: ? Do not flex or bend the affected arm. ? Do not push or pull heavy objects with the affected arm. ? Do not drive yourself home from the hospital or clinic. You may drive 24 hours after the procedure unless your health care provider tells you not to. ? Do not operate machinery or power tools.  Do not lift anything that is heavier than  10 lb (4.5 kg), or the limit that you are told, until your health care provider says that it is safe.  Ask your health care provider when it is okay to: ? Return to work or school. ? Resume usual physical activities or sports. ? Resume sexual activity.   General instructions  If the catheter site starts to bleed, raise your arm and put firm pressure on the site. If the bleeding does not stop, get help right away. This is a medical emergency.  If you went home on the same day as your procedure, a responsible adult should be with you for the first 24 hours after you arrive home.  Keep all follow-up visits as told by your health care provider. This is important. Contact a health care provider if:  You have a fever.  You have redness, swelling, or yellow drainage around your insertion site.  Get help right away if:  You have unusual pain at the radial site.  The catheter insertion area swells very fast.  The insertion area is bleeding, and the bleeding does not stop when you hold steady pressure on the area.  Your arm or hand becomes pale, cool, tingly, or numb. These symptoms may represent a serious problem that is an emergency. Do not wait to see if the symptoms will go away. Get medical help right away. Call your local emergency services (911 in the [place] Do not drive yourself to the hospital. Summary  After the procedure, it is common to have bruising and tenderness at the site.  Follow instructions from your health care provider about how to take care of your radial site wound. Check the wound every day for signs of infection.  Do not lift anything that is heavier than 10 lb (4.5 kg), or the limit that you are told, until your health care provider says that it is safe. This information is not intended to replace advice given to you by your health care provider. Make sure you discuss any questions you have with your health care provider. Document Revised: 01/31/2017 Document  Reviewed: 01/31/2017 Elsevier Patient Education  2021 [employer].

## 2020-04-01 NOTE — Telephone Encounter (Signed)
Pt already d/c  MW has nothing is 2 wks, will need to see APP  [facility] for the pt and forwarding to front desk pool for f/u per protocol

## 2020-04-01 NOTE — Telephone Encounter (Signed)
Please schedule a post-hospital follow up appointment with Dr. Melvyn Novas in about 2 weeks. Thanks!  Julian Hy, DO 04/01/20 11:19 AM [facility]

## 2020-04-01 NOTE — TOC Transition Note (Signed)
Transition of Care [facility]) - CM/SW Discharge Note   Patient Details  Name: Julia Daniels MRN: [id number] Date of Birth: Apr 30, 1950  [facility]) CM/SW Contact:  Bethena Roys, RN Phone Number: 04/01/2020, 11:21 AM   Clinical Narrative: Patient will transition home today.  Son will be at the home to receive the patient from [facility]. Case Manager called [facility] at 1103 am and [facility] stated they will arrive to the hospital within the hour. Case Manager clarified that patient has oxygen in the home from [employer] to be connected once she gets home. Trilogy should be delivered to the home today via [employer]. Case Manager has called [place] with an outpatient palliative referral. [place] has agreed to accept the patient and the office will call the patient for visit times- liaison is aware that the patient does not have insurance and  Medicare. Case Manager called [facility] and the patient is agreeable to pay out of pocket for home health physical therapy-office to call the patient to get the paperwork signed. Patient will be followed by [facility] for personal care services for the aide services. [place] will call the patient on Friday. No further needs from Case Manager at this time.   Final next level of care: [place] Barriers to Discharge: No Barriers Identified   Patient Goals and CMS Choice Patient states their goals for this hospitalization and ongoing recovery are:: to return home with family support   Choice offered to / list presented to :  (Patient will not qualify for [facility]- patient will have to pay out of pocket for [facility] PT Services)   Discharge Plan and Services In-house Referral: Clinical Social Work Discharge Planning Services: CM Consult Post Acute Care Choice: Home Health,Durable Medical Equipment          DME Arranged: Ventilator (Trilogy Vent) DME Agency: [employer] Date DME  Agency Contacted: 03/26/20 Time DME Agency Contacted: [phone] Representative spoke with at DME Agency: [place]: RN,Disease Management [place] Agency: [facility] (checking to see if she can get charity care.) Date [place]: 03/26/20 Time [place]: 1700 Representative spoke with at [place]: [place]  Readmission Risk Interventions Readmission Risk Prevention Plan 03/26/2020  Transportation Screening Complete  Medication Review Press photographer) Complete  PCP or Specialist appointment within 3-5 days of discharge Complete  HRI or [place] Complete  SW Recovery Care/Counseling Consult Complete  [place] Not Applicable  Some recent data might be hidden

## 2020-04-01 NOTE — Progress Notes (Signed)
[facility]) Hospital Liaison RN note  This patient has been referred to [facility] for outpatient based palliative care at discharge.  Please call for any outpatient based palliative care questions.  Thank you. Margaretmary Eddy, BSN, RN [facility] Liaison [phone]

## 2020-04-08 NOTE — Telephone Encounter (Signed)
Called and scheduled hospital f/u with [facility] on 04/15/2020 -pr

## 2020-04-13 ENCOUNTER — Telehealth: Payer: Self-pay | Admitting: Primary Care

## 2020-04-13 MED ORDER — STIOLTO RESPIMAT 2.5-2.5 MCG/ACT IN AERS: 2.0000 | INHALATION_SPRAY | Freq: Every day | RESPIRATORY_TRACT | 0 refills | Status: DC

## 2020-04-13 NOTE — Telephone Encounter (Signed)
Called and spoke with pt letting her know the info stated by [facility] and she verbalized understanding. Pt said that she will try to get a ride to come to the office for her appt but said if it came down to the wire and she wasn't able to get a ride, she would call to let us know so we could change her appt to either a video/televisit. Nothing further needed.

## 2020-04-13 NOTE — Telephone Encounter (Signed)
She should come in for visit if possible, if unable than video/televisit is next best

## 2020-04-13 NOTE — Telephone Encounter (Signed)
Called and spoke with pt letting her know that we did have samples of Stiolto that I was placing up front for her and she verbalized understanding. Stated to her that we would check with Beth to see if she was fine with her upcoming OV being changed to a televisit and stated to her that we would call her back once we had a response.  Beth, please advise if you are okay with pt's appt with you on 4/7 being a televisit.

## 2020-04-15 ENCOUNTER — Inpatient Hospital Stay: Payer: Self-pay | Admitting: Primary Care

## 2020-04-15 ENCOUNTER — Other Ambulatory Visit: Payer: Self-pay

## 2020-04-16 ENCOUNTER — Ambulatory Visit: Payer: Self-pay | Admitting: Primary Care

## 2020-04-16 ENCOUNTER — Telehealth: Payer: Self-pay | Admitting: Primary Care

## 2020-04-16 ENCOUNTER — Other Ambulatory Visit (HOSPITAL_COMMUNITY): Payer: Self-pay

## 2020-04-16 ENCOUNTER — Encounter: Payer: Self-pay | Admitting: Primary Care

## 2020-04-16 VITALS — BP 120/78 | HR 100 | Temp 97.6°F | Ht 63.0 in | Wt 224.0 lb

## 2020-04-16 DIAGNOSIS — I5032 Chronic diastolic (congestive) heart failure: Secondary | ICD-10-CM | POA: Diagnosis not present

## 2020-04-16 DIAGNOSIS — J9611 Chronic respiratory failure with hypoxia: Secondary | ICD-10-CM

## 2020-04-16 DIAGNOSIS — J9612 Chronic respiratory failure with hypercapnia: Secondary | ICD-10-CM

## 2020-04-16 DIAGNOSIS — J449 Chronic obstructive pulmonary disease, unspecified: Secondary | ICD-10-CM | POA: Diagnosis not present

## 2020-04-16 LAB — BASIC METABOLIC PANEL
BUN: 20 mg/dL (ref 6–23)
CO2: 36 mEq/L — ABNORMAL HIGH (ref 19–32)
Calcium: 9.6 mg/dL (ref 8.4–10.5)
Chloride: 98 mEq/L (ref 96–112)
Creatinine, Ser: 0.67 mg/dL (ref 0.40–1.20)
GFR: 90.41 mL/min (ref >60.00)
Glucose, Bld: 120 mg/dL — ABNORMAL HIGH (ref 70–99)
Potassium: 4.2 mEq/L (ref 3.5–5.1)
Sodium: 138 mEq/L (ref 135–145)

## 2020-04-16 LAB — BRAIN NATRIURETIC PEPTIDE: Pro B Natriuretic peptide (BNP): 28 pg/mL (ref 0.0–100.0)

## 2020-04-16 MED ORDER — PREDNISONE 10 MG PO TABS: ORAL_TABLET | ORAL | 0 refills | Status: AC

## 2020-04-16 MED ORDER — TRELEGY ELLIPTA 200-62.5-25 MCG/INH IN AEPB: 1.0000 | INHALATION_SPRAY | Freq: Every day | RESPIRATORY_TRACT | 5 refills | Status: AC

## 2020-04-16 MED ORDER — TRELEGY ELLIPTA 200-62.5-25 MCG/INH IN AEPB: 1.0000 | INHALATION_SPRAY | Freq: Every day | RESPIRATORY_TRACT | 0 refills | Status: AC

## 2020-04-16 NOTE — Patient Instructions (Addendum)
Recommendations: - Stop Stiolto and Pulmicort nebulizer - Start Trelegy 200, take one puff daily in the morning (rinse mouth after use) - Alternate 30mg  prednisone daily with 20mg  every other day x 1-2 weeks; then take 20mg  daily x 1-2 weeks; then 10mg  daily and stay on this dose until follow-up with Dr. Melvyn Novas  - Continue to use flutter valve and Incentive spirometry daily - Continue to wear Trilogy ventilator at night while sleeping - Continue 40mg  lasix twice daily   Orders: - Labs today (BMET ans BNP)  Referral: - Pharmacy consult for consideration of Fasenra home injections (I sent staff message)  Follow-up - First available with Dr. Melvyn Novas

## 2020-04-16 NOTE — Telephone Encounter (Signed)
Submitted a Prior Authorization request to [facility] for [facility] via Cover My Meds. Will update once we receive a response.   Key: [id number] - PA Case ID: [id number]

## 2020-04-16 NOTE — Progress Notes (Signed)
BNP was normal, CO2 is baseline from 2 weeks ago. Continue lasix and Trilogy ventilator as prescribed.

## 2020-04-16 NOTE — Telephone Encounter (Signed)
Can we transition patient to home Fasenra injections?

## 2020-04-16 NOTE — Progress Notes (Signed)
@Patient  ID: Julia Daniels, female    DOB: Sep 29, 1950, 70 y.o.   MRN: [id number]  Chief Complaint  Patient presents with  . Follow-up    Sob worse this week. On prednisone taper, but sine on 20mg  sob has been getting worse.     Referring provider: Simona Huh, NP  HPI: 70 year old female, former smoker. PMH significant for COPD Gold 3 with reversibility, eosinophilia, chronic respiratory failure, chronic diastolic heart failure, hypertension. Patient of Dr. Melvyn Novas, last seen in office on 02/23/2020. Maintained on Stiolto Respimat, budesonide nebulizers twice daily and Fasenra. Prednisone ceiling 20mg /floor 10mg  daily.   Previous [facility] pulmonary encounter: 03/05/2020 Patient presents today for acute visit with reports on ongoing shortness of breath, no better since last visit. She received fasenra injection on 03/01/20 from home health nurse. Breathing has been declining. She gets winded with exertion and talking. Shortness of breath does recover with rest. She has been taking 20mg  prednisone without any noticeable improvement. She is mostly compliant with budesonide nebulizer twice a day and Stiolto. She has to sleep at 45 degree angle. Her weight is up significantly, approx 20 lbs in the last 6 weeks and she has increased leg swelling. Okay increased her lasix to 80mg  daily since Monday 03/01/20. She is looking for new PCP. Denies cough, chest tightness, wheezing.  03/15/2020 Patient contacted today for 10 day follow-up. During last visit we increased her lasix to 120mg  total per day x 5 days for exacerbation HF d/t  weight gain, leg swelling and shortness of breath. [place] had previously increased her lasix to 80mg  with little improvement. She was also given prednisone taper for COPD exacerbation. BNP was 90, kidney function wnl . Today she reports that she is doing well. States that her feet swelling and abdominal bloating are way down. She has been strict about elevating  her legs. Finsihed prednisone taper. Lasix is back to 40mg  daily. She can tell a difference in her breathing. She feels she is back to her baseline.   04/16/2020 - Interim hx  Patient presents today for hospital follow-up. She was admitted from 03/20/2020-04/01/2020 for acute on chronic hypoxemic and hypercarbic respiratory failure secondary to advanced COPD, CHF and OSA.  She briefly required BiPAP on admission.  She underwent heart cath which showed 60% left main and 70% mid LAD lesion, patient was felt to be poor candidate for CABG recommended medical management with aspirin, Toprol and Lipitor.  She was diuresed.  Pulmonary was consulted for advanced COPD.  Patient was discharged home on oral Lasix and prolonged steroid taper. Will need trilogy nocturnal ventilator at bedtime.  She reports increased shortness of breath when tapering from 30mg  prednisone to 20mg . She has a rare np cough. She is maintained on Stiolto Respimat, Pulmicort nebulizers twice daily, Singulair, Fasenra injections. She is overdue for Fasenra injection, wondering about home injections. She is asking if there are any new inhalers. She would like to try Trelegy. She has been using flutter valve and incentive spirometer. She is complaint with taking her lasix 40mg  twice daily. Her weigh has been fluctuating. She has been stricter with her diet. She has some swelling in her feet but is elevating her legs which helps. She is limiting her fluid intake. She has received and is using trilogy ventilator at night. She is wearing 3L oxygen. DME company is [employer]. She has nursing and physical therapy at home.   Allergies  Allergen Reactions  . Sulfa Antibiotics Other (See Comments)  Mouth gets raw    Immunization History  Administered Date(s) Administered  . Fluad Quad(high Dose 65+) 10/03/2018, 10/23/2019  . Influenza Whole 09/09/2017  . Influenza,inj,Quad PF,6+ Mos 09/23/2012  . Pneumococcal Polysaccharide-23 02/02/2018, 12/14/2018   . Tdap 02/07/2013    Past Medical History:  Diagnosis Date  . Anxiety   . Asthma   . COPD (chronic obstructive pulmonary disease) ([place])   . Depression     Tobacco History: Social History   Tobacco Use  Smoking Status Former Smoker  . Packs/day: 1.00  . Years: 46.00  . Pack years: 46.00  . Types: Cigarettes  . Start date: 55  . Quit date: 08/23/2017  . Years since quitting: 2.6  Smokeless Tobacco Never Used   Counseling given: Not Answered   Outpatient Medications Prior to Visit  Medication Sig Dispense Refill  . albuterol (PROAIR HFA) 108 (90 Base) MCG/ACT inhaler Inhale 2 puffs into the lungs every 6 (six) hours as needed for wheezing or shortness of breath. 18 g 5  . albuterol (PROVENTIL) (2.5 MG/3ML) 0.083% nebulizer solution Take 3 mLs (2.5 mg total) by nebulization every 6 (six) hours as needed for wheezing or shortness of breath. 300 mL 12  . ALPRAZolam (XANAX) 0.25 MG tablet Take 0.25 mg by mouth 2 (two) times daily as needed for anxiety.    Marland Kitchen apixaban (ELIQUIS) 5 MG TABS tablet Take 1 tablet (5 mg total) by mouth 2 (two) times daily.    Marland Kitchen aspirin EC 81 MG EC tablet Take 1 tablet (81 mg total) by mouth daily. Swallow whole. 30 tablet 2  . atorvastatin (LIPITOR) 80 MG tablet Take 1 tablet (80 mg total) by mouth daily. 30 tablet 2  . baclofen (LIORESAL) 10 MG tablet Take 10 mg by mouth 3 (three) times daily.     . Baclofen 5 MG TABS Take 2.5-5 mg by mouth 2 (two) times daily as needed for pain.    . barrier cream (NON-SPECIFIED) CREA Apply 1 application topically daily as needed (apply to rash). 1 each 1  . Cholecalciferol (VITAMIN D3) 1.25 MG (50000 UT) CAPS Take 1 capsule by mouth once a week.    . famotidine (PEPCID) 20 MG tablet One at bedtime (Patient taking differently: Take 20 mg by mouth at bedtime.) 90 tablet 3  . FASENRA 30 MG/ML SOSY INJECT 1 SYRINGE UNDER THE SKIN EVERY 8 WEEKS. (Patient taking differently: Inject into the skin every 8 (eight) weeks.) 1 mL  6  . furosemide (LASIX) 40 MG tablet Take 1 tablet (40 mg total) by mouth 2 (two) times daily. 60 tablet 2  . guaiFENesin (MUCINEX) 600 MG 12 hr tablet Take 1 tablet (600 mg total) by mouth 2 (two) times daily. (Patient taking differently: Take 600 mg by mouth 2 (two) times daily as needed for cough.)    . Infant Care Products [facility]) OINT Apply 1 application topically daily as needed (rash).    Marland Kitchen lamoTRIgine (LAMICTAL) 200 MG tablet Take 200 mg by mouth at bedtime.     . lidocaine (LIDODERM) 5 % Place 1 patch onto the skin daily. Remove & Discard patch within 12 hours or as directed by MD 30 patch 0  . loratadine (CLARITIN) 10 MG tablet Take 10 mg by mouth every evening.     . metoprolol succinate (TOPROL-XL) 25 MG 24 hr tablet Take 0.5 tablets (12.5 mg total) by mouth daily. 15 tablet 2  . montelukast (SINGULAIR) 10 MG tablet Take 1 tablet (10 mg total) by  mouth at bedtime. 30 tablet 2  . pantoprazole (PROTONIX) 40 MG tablet Take 1 tablet (40 mg total) by mouth daily. Take 30-60 min before first meal of the day 30 tablet 11  . potassium chloride (KLOR-CON) 10 MEQ tablet Take 1 tablet (10 mEq total) by mouth 2 (two) times daily. Take with lasix. 60 tablet 2  . QUEtiapine (SEROQUEL) 50 MG tablet Take 150 mg by mouth at bedtime.    . risperiDONE (RISPERDAL) 1 MG tablet Take 2 mg by mouth at bedtime.    . senna-docusate (SENOKOT-S) 8.6-50 MG tablet Take 1 tablet by mouth 2 (two) times daily.    Marland Kitchen venlafaxine XR (EFFEXOR-XR) 150 MG 24 hr capsule Take 150 mg by mouth at bedtime.     . vitamin B-12 (CYANOCOBALAMIN) 500 MCG tablet Take 1 tablet (500 mcg total) by mouth daily. 30 tablet 0  . budesonide (PULMICORT) 0.5 MG/2ML nebulizer solution Take 2 mLs (0.5 mg total) by nebulization 2 (two) times daily. 120 mL 12  . predniSONE (DELTASONE) 10 MG tablet Take 30mg  daily for 1 week, then taper down to 20mg  daily for 1 week. Follow up with pulmonology for further tapering doses. 50 tablet 0  .  Tiotropium Bromide-Olodaterol (STIOLTO RESPIMAT) 2.5-2.5 MCG/ACT AERS Inhale 2 puffs into the lungs daily. 1 each 11  . Tiotropium Bromide-Olodaterol (STIOLTO RESPIMAT) 2.5-2.5 MCG/ACT AERS Inhale 2 puffs into the lungs daily. 4 g 0   No facility-administered medications prior to visit.   Review of Systems  Review of Systems  Respiratory: Positive for shortness of breath. Negative for cough.     Physical Exam  BP 120/78 (BP Location: Right Arm, Cuff Size: Normal)   Pulse 100   Temp 97.6 F (36.4 C)   Ht 5\' 3"  (1.6 m)   Wt 224 lb (101.6 kg)   SpO2 99%   BMI 39.68 kg/m  Physical Exam Constitutional:      Appearance: Normal appearance.  HENT:     Head: Normocephalic and atraumatic.  Cardiovascular:     Rate and Rhythm: Normal rate and regular rhythm.     Comments: Trace BLE edema Pulmonary:     Effort: Pulmonary effort is normal.     Breath sounds: Wheezing present.  Musculoskeletal:     Comments: In WC  Skin:    General: Skin is warm and dry.  Neurological:     General: No focal deficit present.     Mental Status: She is alert and oriented to person, place, and time. Mental status is at baseline.  Psychiatric:        Mood and Affect: Mood normal.        Behavior: Behavior normal.        Thought Content: Thought content normal.        Judgment: Judgment normal.      Lab Results:  CBC    Component Value Date/Time   WBC 12.8 (H) 04/01/2020 0242   RBC 3.49 (L) 04/01/2020 0242   HGB 11.2 (L) 04/01/2020 0242   HCT 35.2 (L) 04/01/2020 0242   PLT 308 04/01/2020 0242   MCV 100.9 (H) 04/01/2020 0242   MCH 32.1 04/01/2020 0242   MCHC 31.8 04/01/2020 0242   RDW 12.7 04/01/2020 0242   LYMPHSABS 1.1 03/20/2020 2234   MONOABS 0.8 03/20/2020 2234   EOSABS 0.0 03/20/2020 2234   BASOSABS 0.0 03/20/2020 2234    BMET    Component Value Date/Time   NA 137 03/31/2020 0214   K 4.3  03/31/2020 0214   CL 96 (L) 03/31/2020 0214   CO2 34 (H) 03/31/2020 0214   GLUCOSE 107  (H) 03/31/2020 0214   BUN 15 03/31/2020 0214   CREATININE 0.78 03/31/2020 0214   CREATININE 0.70 03/05/2020 1551   CALCIUM 10.2 03/31/2020 0214   GFRNONAA >60 03/31/2020 0214   GFRAA >60 10/01/2019 0939    BNP    Component Value Date/Time   BNP 76.8 03/20/2020 2327   BNP 90 03/05/2020 1551    ProBNP No results found for: PROBNP  Imaging: DG Chest 2 View  Result Date: 03/20/2020 CLINICAL DATA:  Short of breath, asthma, COPD EXAM: CHEST - 2 VIEW COMPARISON:  03/05/2020 FINDINGS: Frontal and lateral views of the chest demonstrates stable enlargement of the cardiac silhouette. Chronic ectasia of the thoracic aorta. Chronic areas of scarring are seen throughout the lungs. There is central vascular congestion, with increased interstitial prominence compatible with interstitial edema. No acute airspace disease, effusion, or pneumothorax. No acute bony abnormalities. IMPRESSION: 1. Worsening volume status, with central vascular congestion and interstitial edema. Electronically Signed   By: Randa Ngo M.D.   On: 03/20/2020 22:35   CARDIAC CATHETERIZATION  Result Date: 03/24/2020  Ost LM to Mid LM lesion is 60% stenosed.  Mid LAD lesion is 70% stenosed.  The left ventricular systolic function is normal.  The left ventricular ejection fraction is 50-55% by visual estimate.  There is no aortic valve stenosis.  LV end diastolic pressure is moderately elevated.  Hemodynamic findings consistent with mild pulmonary hypertension. Ao sat 93%, PA sat 67%, PA pressure 36/26, mean PA pressure 30 mm Hg, mean PCWP 22 mm Hg; CO 6.27 L/min, CI 3.08  Severe lesion in the right ostial common iliac artery with a 25 mm Hg pressure gradient. AAA present.  Severe, calcific left main disease.  I do not think she will be a candidate for CABG.  Would be high risk PCI and would not pursue this given her lack of angina. Severe PAD as well which limit support options.  Still volume overloaded.  Would continue  diuresis and intensify medical therapy for CAD.  Discussed with the son and with Dr. Radford Pax.   PERIPHERAL VASCULAR CATHETERIZATION  Result Date: 03/24/2020  Ost LM to Mid LM lesion is 60% stenosed.  Mid LAD lesion is 70% stenosed.  The left ventricular systolic function is normal.  The left ventricular ejection fraction is 50-55% by visual estimate.  There is no aortic valve stenosis.  LV end diastolic pressure is moderately elevated.  Hemodynamic findings consistent with mild pulmonary hypertension. Ao sat 93%, PA sat 67%, PA pressure 36/26, mean PA pressure 30 mm Hg, mean PCWP 22 mm Hg; CO 6.27 L/min, CI 3.08  Severe lesion in the right ostial common iliac artery with a 25 mm Hg pressure gradient. AAA present.  Severe, calcific left main disease.  I do not think she will be a candidate for CABG.  Would be high risk PCI and would not pursue this given her lack of angina. Severe PAD as well which limit support options.  Still volume overloaded.  Would continue diuresis and intensify medical therapy for CAD.  Discussed with the son and with Dr. Radford Pax.   DG CHEST PORT 1 VIEW  Result Date: 03/31/2020 CLINICAL DATA:  70 year old female with shortness of breath. EXAM: PORTABLE CHEST 1 VIEW COMPARISON:  03/20/2020 FINDINGS: The heart size and mediastinal contours are within normal limits. Interval improved aeration. Mild cephalization of pulmonary vasculature. Streaky bibasilar  opacities. No evidence of significant pleural effusion. No pneumothorax. The visualized skeletal structures are unremarkable. IMPRESSION: Mild pulmonary edema, improved from comparison. Electronically Signed   By: Ruthann Cancer MD   On: 03/31/2020 09:00   ECHOCARDIOGRAM COMPLETE  Result Date: 03/21/2020    ECHOCARDIOGRAM REPORT   Patient Name:   MIYO AINA Date of Exam: 03/21/2020 Medical Rec #:  [id number]           Height:       63.0 in Accession #:    [id number]          Weight:       230.6 lb Date of Birth:   07/20/1950          BSA:          2.054 m Patient Age:    48 years            BP:           120/84 mmHg Patient Gender: F                   HR:           95 bpm. Exam Location:  Inpatient Procedure: 2D Echo, Cardiac Doppler, Color Doppler and Intracardiac            Opacification Agent Indications:    I50.40* Unspecified combined systolic (congestive) and diastolic                 (congestive) heart failure  History:        Patient has prior history of Echocardiogram examinations, most                 recent 11/04/2019. CHF, COPD, Signs/Symptoms:Shortness of Breath                 and Dyspnea; Risk Factors:Current Smoker and Hypertension.                 Hypoxia.  Sonographer:    Roseanna Rainbow RDCS Referring Phys: [id number] ALEXIS HUGELMEYER  Sonographer Comments: Technically difficult study due to poor echo windows, suboptimal parasternal window, suboptimal apical window, suboptimal subcostal window and patient is morbidly obese. Image acquisition challenging due to patient body habitus. Patient in high fowler's position. IMPRESSIONS  1. Mild hypokinesis of the mid to apical anteroseptum, inferoseptum and inferior myocardium. Left ventricular ejection fraction, by estimation, is 45 to 50%. The left ventricle has mildly decreased function. The left ventricle demonstrates regional wall  motion abnormalities (see scoring diagram/findings for description). Left ventricular diastolic parameters are consistent with Grade I diastolic dysfunction (impaired relaxation). Elevated left ventricular end-diastolic pressure.  2. Right ventricular systolic function is normal. The right ventricular size is normal. There is normal pulmonary artery systolic pressure.  3. The mitral valve is normal in structure. No evidence of mitral valve regurgitation. No evidence of mitral stenosis.  4. The aortic valve is normal in structure. Aortic valve regurgitation is not visualized. No aortic stenosis is present.  5. Aortic dilatation noted. There  is mild dilatation of the ascending aorta, measuring 41 mm.  6. The inferior vena cava is dilated in size with >50% respiratory variability, suggesting right atrial pressure of 8 mmHg. FINDINGS  Left Ventricle: Mild hypokinesis of the mid to apical anteroseptum, inferoseptum and inferior myocardium. Left ventricular ejection fraction, by estimation, is 45 to 50%. The left ventricle has mildly decreased function. The left ventricle demonstrates regional wall motion abnormalities. Definity contrast agent was given IV to  delineate the left ventricular endocardial borders. The left ventricular internal cavity size was normal in size. There is no left ventricular hypertrophy. Left ventricular diastolic parameters are consistent with Grade I diastolic dysfunction (impaired relaxation). Elevated left ventricular end-diastolic pressure. Right Ventricle: The right ventricular size is normal. No increase in right ventricular wall thickness. Right ventricular systolic function is normal. There is normal pulmonary artery systolic pressure. The tricuspid regurgitant velocity is 1.34 m/s, and  with an assumed right atrial pressure of 8 mmHg, the estimated right ventricular systolic pressure is 62.2 mmHg. Left Atrium: Left atrial size was normal in size. Right Atrium: Right atrial size was normal in size. Pericardium: There is no evidence of pericardial effusion. Mitral Valve: The mitral valve is normal in structure. No evidence of mitral valve regurgitation. No evidence of mitral valve stenosis. Tricuspid Valve: The tricuspid valve is normal in structure. Tricuspid valve regurgitation is trivial. No evidence of tricuspid stenosis. Aortic Valve: The aortic valve is normal in structure. Aortic valve regurgitation is not visualized. No aortic stenosis is present. Pulmonic Valve: The pulmonic valve was normal in structure. Pulmonic valve regurgitation is not visualized. No evidence of pulmonic stenosis. Aorta: Aortic dilatation  noted. There is mild dilatation of the ascending aorta, measuring 41 mm. Venous: The inferior vena cava is dilated in size with greater than 50% respiratory variability, suggesting right atrial pressure of 8 mmHg. IAS/Shunts: No atrial level shunt detected by color flow Doppler.  LEFT VENTRICLE PLAX 2D LVIDd:         4.30 cm     Diastology LVIDs:         3.40 cm     LV e' medial:    4.90 cm/s LV PW:         1.30 cm     LV E/e' medial:  14.2 LV IVS:        1.40 cm     LV e' lateral:   8.92 cm/s LVOT diam:     2.10 cm     LV E/e' lateral: 7.8 LV SV:         75 LV SV Index:   37 LVOT Area:     3.46 cm  LV Volumes (MOD) LV vol d, MOD A2C: 76.8 ml LV vol d, MOD A4C: 63.9 ml LV vol s, MOD A2C: 36.7 ml LV vol s, MOD A4C: 27.1 ml LV SV MOD A2C:     40.1 ml LV SV MOD A4C:     63.9 ml LV SV MOD BP:      38.5 ml RIGHT VENTRICLE             IVC RV S prime:     10.30 cm/s  IVC diam: 2.20 cm TAPSE (M-mode): 1.4 cm LEFT ATRIUM             Index       RIGHT ATRIUM           Index LA diam:        2.60 cm 1.27 cm/m  RA Area:     11.90 cm LA Vol (A2C):   25.5 ml 12.41 ml/m RA Volume:   29.50 ml  14.36 ml/m LA Vol (A4C):   17.7 ml 8.62 ml/m LA Biplane Vol: 22.6 ml 11.00 ml/m  AORTIC VALVE LVOT Vmax:   146.00 cm/s LVOT Vmean:  95.700 cm/s LVOT VTI:    0.217 m  AORTA Ao Root diam: 3.20 cm Ao Asc diam:  4.10 cm MITRAL VALVE  TRICUSPID VALVE MV Area (PHT): 3.17 cm     TR Peak grad:   7.2 mmHg MV Decel Time: 239 msec     TR Vmax:        134.00 cm/s MV E velocity: 69.80 cm/s MV A velocity: 111.00 cm/s  SHUNTS MV E/A ratio:  0.63         Systemic VTI:  0.22 m                             Systemic Diam: 2.10 cm Skeet Latch MD Electronically signed by Skeet Latch MD Signature Date/Time: 03/21/2020/1:29:30 PM    Final      Assessment & Plan:   COPD  GOLD ? III with increased Eos and reversibility ? ACOS ?  - Hospitalized in March 2022 for acute on chronic resp failure secondary to COPD, OSA and CHF. Started  on Trilogy ventilator at night. She appears to be doing fairly well overall, reports increased shortness of breath when tapering from 30mg  prednisone to 20mg . She has scattered wheezing through her lungs. We will try changing her maintenance regiment to Trelegy 225mcg daily. May potentially need to continue budesonide nebulizer's prn. Recommend alternating 30mg  prednisone with 20mg  x 1-2 weeks; then 20mg  daily x 1-2 weeks; then 10mg  daily which is her baseline dose. She is over due for Fasenra injection, interested in home injections. We will touch base with our pharmacy team to help set this up if patient is a candidate. Checking BMET to monitor CO2 and BNP to monitor fluid balance.   Chronic diastolic CHF (congestive heart failure) ([facility]) - Appears euvolemic. Trace BLE edema.  - Continue Lasix 40mg  twice daily - Checking BMET and BNP  Chronic respiratory failure with hypoxia and hypercapnia ([place]) - Started on Trilogy in March 2022. She is compliant with use.  - Continues to benefit from [place], NP 04/16/2020

## 2020-04-16 NOTE — Assessment & Plan Note (Addendum)
-   Started on Trilogy in March 2022. She is compliant with use.  - Continues to benefit from 3L oxygen

## 2020-04-16 NOTE — Assessment & Plan Note (Addendum)
-   Hospitalized in March 2022 for acute on chronic resp failure secondary to COPD, OSA and CHF. Started on Trilogy ventilator at night. She appears to be doing fairly well overall, reports increased shortness of breath when tapering from 30mg  prednisone to 20mg . She has scattered wheezing through her lungs. We will try changing her maintenance regiment to Trelegy 237mcg daily. May potentially need to continue budesonide nebulizer's prn. Recommend alternating 30mg  prednisone with 20mg  x 1-2 weeks; then 20mg  daily x 1-2 weeks; then 10mg  daily which is her baseline dose. She is over due for Fasenra injection, interested in home injections. We will touch base with our pharmacy team to help set this up if patient is a candidate. Checking BMET to monitor CO2 and BNP to monitor fluid balance.

## 2020-04-16 NOTE — Assessment & Plan Note (Addendum)
-   Appears euvolemic. Trace BLE edema.  - Continue Lasix 40mg  twice daily - Checking BMET and BNP

## 2020-04-19 ENCOUNTER — Emergency Department (HOSPITAL_COMMUNITY)
Admission: EM | Admit: 2020-04-19 | Discharge: 2020-05-09 | Disposition: E | Payer: BC Managed Care – PPO | Attending: Emergency Medicine | Admitting: Emergency Medicine

## 2020-04-19 ENCOUNTER — Other Ambulatory Visit: Payer: Self-pay

## 2020-04-19 ENCOUNTER — Emergency Department (HOSPITAL_COMMUNITY): Payer: BC Managed Care – PPO

## 2020-04-19 ENCOUNTER — Encounter (HOSPITAL_COMMUNITY): Payer: Self-pay | Admitting: Emergency Medicine

## 2020-04-19 DIAGNOSIS — I2510 Atherosclerotic heart disease of native coronary artery without angina pectoris: Secondary | ICD-10-CM | POA: Diagnosis not present

## 2020-04-19 DIAGNOSIS — J45909 Unspecified asthma, uncomplicated: Secondary | ICD-10-CM | POA: Diagnosis not present

## 2020-04-19 DIAGNOSIS — I5033 Acute on chronic diastolic (congestive) heart failure: Secondary | ICD-10-CM | POA: Diagnosis not present

## 2020-04-19 DIAGNOSIS — R092 Respiratory arrest: Secondary | ICD-10-CM | POA: Diagnosis not present

## 2020-04-19 DIAGNOSIS — R000 Tachycardia, unspecified: Secondary | ICD-10-CM | POA: Diagnosis not present

## 2020-04-19 DIAGNOSIS — J449 Chronic obstructive pulmonary disease, unspecified: Secondary | ICD-10-CM | POA: Insufficient documentation

## 2020-04-19 DIAGNOSIS — I110 Hypertensive heart disease with heart failure: Secondary | ICD-10-CM | POA: Insufficient documentation

## 2020-04-19 DIAGNOSIS — Z7982 Long term (current) use of aspirin: Secondary | ICD-10-CM | POA: Insufficient documentation

## 2020-04-19 DIAGNOSIS — Z87891 Personal history of nicotine dependence: Secondary | ICD-10-CM | POA: Insufficient documentation

## 2020-04-19 DIAGNOSIS — R0600 Dyspnea, unspecified: Secondary | ICD-10-CM | POA: Diagnosis not present

## 2020-04-19 DIAGNOSIS — Z79899 Other long term (current) drug therapy: Secondary | ICD-10-CM | POA: Diagnosis not present

## 2020-04-19 DIAGNOSIS — Z7901 Long term (current) use of anticoagulants: Secondary | ICD-10-CM | POA: Diagnosis not present

## 2020-04-19 DIAGNOSIS — Z4682 Encounter for fitting and adjustment of non-vascular catheter: Secondary | ICD-10-CM | POA: Diagnosis not present

## 2020-04-19 HISTORY — DX: Heart failure, unspecified: I50.9

## 2020-04-19 LAB — CBC WITH DIFFERENTIAL/PLATELET
Abs Immature Granulocytes: 0.5 10*3/uL — ABNORMAL HIGH (ref 0.00–0.07)
Basophils Absolute: 0 10*3/uL (ref 0.0–0.1)
Basophils Relative: 0 %
Eosinophils Absolute: 0 10*3/uL (ref 0.0–0.5)
Eosinophils Relative: 0 %
HCT: 36.1 % (ref 36.0–46.0)
Hemoglobin: 10.6 g/dL — ABNORMAL LOW (ref 12.0–15.0)
Immature Granulocytes: 4 %
Lymphocytes Relative: 22 %
Lymphs Abs: 2.5 10*3/uL (ref 0.7–4.0)
MCH: 31.8 pg (ref 26.0–34.0)
MCHC: 29.4 g/dL — ABNORMAL LOW (ref 30.0–36.0)
MCV: 108.4 fL — ABNORMAL HIGH (ref 80.0–100.0)
Monocytes Absolute: 0.6 10*3/uL (ref 0.1–1.0)
Monocytes Relative: 6 %
Neutro Abs: 7.7 10*3/uL (ref 1.7–7.7)
Neutrophils Relative %: 68 %
Platelets: 237 10*3/uL (ref 150–400)
RBC: 3.33 MIL/uL — ABNORMAL LOW (ref 3.87–5.11)
RDW: 13.2 % (ref 11.5–15.5)
WBC: 11.4 10*3/uL — ABNORMAL HIGH (ref 4.0–10.5)
nRBC: 0 % (ref 0.0–0.2)

## 2020-04-19 LAB — I-STAT CHEM 8, ED
BUN: 35 mg/dL — ABNORMAL HIGH (ref 8–23)
Calcium, Ion: 1.34 mmol/L (ref 1.15–1.40)
Chloride: 94 mmol/L — ABNORMAL LOW (ref 98–111)
Creatinine, Ser: 0.9 mg/dL (ref 0.44–1.00)
Glucose, Bld: 200 mg/dL — ABNORMAL HIGH (ref 70–99)
HCT: 34 % — ABNORMAL LOW (ref 36.0–46.0)
Hemoglobin: 11.6 g/dL — ABNORMAL LOW (ref 12.0–15.0)
Potassium: 4.3 mmol/L (ref 3.5–5.1)
Sodium: 135 mmol/L (ref 135–145)
TCO2: 34 mmol/L — ABNORMAL HIGH (ref 22–32)

## 2020-04-19 LAB — COMPREHENSIVE METABOLIC PANEL
ALT: 30 U/L (ref 0–44)
AST: 36 U/L (ref 15–41)
Albumin: 3.2 g/dL — ABNORMAL LOW (ref 3.5–5.0)
Alkaline Phosphatase: 69 U/L (ref 38–126)
Anion gap: 9 (ref 5–15)
BUN: 30 mg/dL — ABNORMAL HIGH (ref 8–23)
CO2: 32 mmol/L (ref 22–32)
Calcium: 10 mg/dL (ref 8.9–10.3)
Chloride: 95 mmol/L — ABNORMAL LOW (ref 98–111)
Creatinine, Ser: 0.9 mg/dL (ref 0.44–1.00)
GFR, Estimated: >60 mL/min (ref >60)
Glucose, Bld: 198 mg/dL — ABNORMAL HIGH (ref 70–99)
Potassium: 4.6 mmol/L (ref 3.5–5.1)
Sodium: 136 mmol/L (ref 135–145)
Total Bilirubin: 0.6 mg/dL (ref 0.3–1.2)
Total Protein: 6 g/dL — ABNORMAL LOW (ref 6.5–8.1)

## 2020-04-19 LAB — LIPASE, BLOOD: Lipase: 33 U/L (ref 11–51)

## 2020-04-19 LAB — TROPONIN I (HIGH SENSITIVITY): Troponin I (High Sensitivity): 9 ng/L (ref <18)

## 2020-04-19 LAB — BRAIN NATRIURETIC PEPTIDE: B Natriuretic Peptide: 31.2 pg/mL (ref 0.0–100.0)

## 2020-04-19 LAB — LACTIC ACID, PLASMA: Lactic Acid, Venous: 3.2 mmol/L (ref 0.5–1.9)

## 2020-04-19 MED ORDER — MORPHINE SULFATE (PF) 2 MG/ML IV SOLN
2.0000 mg | INTRAVENOUS | Status: DC | PRN
  Administered 2020-04-19: 4 mg via INTRAVENOUS
  Filled 2020-04-19: qty 2

## 2020-04-19 MED ORDER — POLYVINYL ALCOHOL 1.4 % OP SOLN
1.0000 [drp] | Freq: Four times a day (QID) | OPHTHALMIC | Status: DC | PRN
  Filled 2020-04-19: qty 15

## 2020-04-19 MED ORDER — NOREPINEPHRINE 4 MG/250ML-% IV SOLN
0.0000 ug/min | INTRAVENOUS | Status: DC
  Administered 2020-04-19: 5 ug/min via INTRAVENOUS

## 2020-04-19 MED ORDER — GLYCOPYRROLATE 1 MG PO TABS
1.0000 mg | ORAL_TABLET | ORAL | Status: DC | PRN
  Filled 2020-04-19: qty 1

## 2020-04-19 MED ORDER — MIDAZOLAM HCL 2 MG/2ML IJ SOLN
2.0000 mg | INTRAMUSCULAR | Status: DC | PRN
Start: 2020-04-19 — End: 2020-04-20

## 2020-04-19 MED ORDER — MIDAZOLAM HCL 2 MG/2ML IJ SOLN: 1.0000 mg | INTRAMUSCULAR | Status: DC | PRN

## 2020-04-19 MED ORDER — SODIUM CHLORIDE 0.9 % IV BOLUS
1000.0000 mL | Freq: Once | INTRAVENOUS | Status: AC
  Administered 2020-04-19: 1000 mL via INTRAVENOUS

## 2020-04-19 MED ORDER — ACETAMINOPHEN 650 MG RE SUPP: 650.0000 mg | Freq: Four times a day (QID) | RECTAL | Status: DC | PRN

## 2020-04-19 MED ORDER — DIPHENHYDRAMINE HCL 50 MG/ML IJ SOLN: 25.0000 mg | INTRAMUSCULAR | Status: DC | PRN

## 2020-04-19 MED ORDER — GLYCOPYRROLATE 0.2 MG/ML IJ SOLN: 0.2000 mg | INTRAMUSCULAR | Status: DC | PRN

## 2020-04-19 MED ORDER — MIDAZOLAM HCL 2 MG/2ML IJ SOLN
INTRAMUSCULAR | Status: AC
  Administered 2020-04-19: 1 mg via INTRAVENOUS
  Filled 2020-04-19: qty 6

## 2020-04-19 MED ORDER — FENTANYL CITRATE (PF) 100 MCG/2ML IJ SOLN
INTRAMUSCULAR | Status: AC
  Administered 2020-04-19: 50 ug via INTRAVENOUS
  Filled 2020-04-19: qty 2

## 2020-04-19 MED ORDER — FENTANYL CITRATE (PF) 100 MCG/2ML IJ SOLN: 50.0000 ug | INTRAMUSCULAR | Status: DC | PRN

## 2020-04-19 MED ORDER — ACETAMINOPHEN 325 MG PO TABS: 650.0000 mg | ORAL_TABLET | Freq: Four times a day (QID) | ORAL | Status: DC | PRN

## 2020-04-26 NOTE — Telephone Encounter (Signed)
Tobi Bastos from [place] would like approval for prior authorization for Fasenra Pen. Tobi Bastos phone number is [phone].

## 2020-04-26 NOTE — Telephone Encounter (Signed)
Called and spoke with [employer] with [facility] to let them know that the patient has passed away. She expressed understanding and has removed medication from the patients chart and has marked that she has passed. Will forward to pharmacy Beth and Dr. Melvyn Novas as FYI ONLY. Nothing further needed at this time.

## 2020-04-27 ENCOUNTER — Inpatient Hospital Stay: Payer: Self-pay | Admitting: Primary Care

## 2020-04-28 ENCOUNTER — Ambulatory Visit: Payer: Self-pay | Admitting: Cardiology

## 2020-05-09 DIAGNOSIS — 419620001 Death: Secondary | SNOMED CT | POA: Diagnosis not present

## 2020-05-09 NOTE — ED Notes (Signed)
Patient placement [facility]) made aware that patient has passed away and will be transported to the morgue.

## 2020-05-09 NOTE — Telephone Encounter (Signed)
Received notification from [facility] regarding a prior authorization for [facility] PEN. Authorization has been APPROVED from 08/05/19 to 08/04/20.   Authorization # [id number] Phone # [phone]

## 2020-05-09 NOTE — Death Summary Note (Signed)
Physician Discharge Summary  Patient ID: Julia Daniels MRN: [id number] DOB/AGE: 06/24/1950 70 y.o.  Admit date: 04/21/20 Discharge date: 2020-04-21  Admission Diagnoses: Cardiac arrest Acute on chronic respiratory failure with hypoxia and hypercapnia COPD asthma overlap syndrome Obesity Obesity hypoventilation syndrome Cushing syndrome Chronic anemia Lactic acidosis Hyperglycemia Hypothermia Shock, presumed cardiogenic  Discharge Diagnoses:  Cardiac arrest Acute on chronic respiratory failure with hypoxia and hypercapnia COPD asthma overlap syndrome Obesity Obesity hypoventilation syndrome Cushing syndrome Chronic anemia Lactic acidosis Hyperglycemia Hypothermia Shock, presumed cardiogenic  Discharged Condition: deceased  Hospital Course:  Julia Daniels is a 70 year old woman with a history of severe COPD and asthma overlap, chronic respiratory failure with hypoxia and hypercapnia, and multiple recent hospitalizations who presented with acute onset shortness of breath.  Unfortunately she arrested with EMS who started chest compressions and she was intubated upon arrival to the emergency department.  Her son is at bedside with her and reported that she would not want this.  Previous admissions she has been DNR CODE STATUS.  Her son feels that we are providing care that she would not want and he understands her prognosis is poor given how functionally limited she was from her severe chronic pulmonary disease.  The decision for terminal extubation was made.  Her comfort was ensured with medications to address air hunger.  She passed away at 14: 15 on Apr 21, 2020 with her son at bedside in the.  Consults: pulmonary/intensive care   Treatments: Mechanical ventilation, palliative care  Discharge Exam: Blood pressure (!) 104/50, pulse 96, temperature (!) 97.5 F (36.4 C), resp. rate 11, SpO2 (!) 51 %. Examined in the ED Elderly woman intubated [place]/AT, eyes anicteric, ETT  in place Cushingoid appearance S1-S2, regular rate and rhythm Prolonged exhalation, wheezing bilaterally Abdomen obese, soft, nontender No peripheral edema, no clubbing or cyanosis Thin skin, ecchymoses, no rashes RASS -5, pupils nonreactive, no gag or cough   Disposition: Funeral home or crematorium of the family's choosing      Signed: Julian Hy April 21, 2020, 3:18 PM

## 2020-05-09 NOTE — ED Provider Notes (Signed)
[facility] EMERGENCY DEPARTMENT Provider Note   CSN: [id number] Arrival date & time: April 27, 2020  1156     History No chief complaint on file.   Julia Daniels is a 70 y.o. female.  HPI Patient presents via EMS after initial call for respiratory difficulty. History is obtained by EMS providers and eventually by the patient's son. Patient is unresponsive, level 5 caveat. Per report the patient developed dyspnea, and on arrival was found to be tripoding, with labored respiration.  After 1 breathing treatment the patient became unresponsive, required CPR, approximately 3 cycles.  She had return of spontaneous circulation, placement of King airway and was brought here for evaluation. Following resuscitation, the patient was found to have DNR paperwork.  I have subsequently discussed this with the patient's son.  She notes that the patient does have this paperwork, but today's episode may have involved accidental ingestion of the patient's nighttime medication, this morning.  She is on multiple sedatives as she has a history of bipolar disorder.  Son affirms plan for continued resuscitation to get information, with reassessment.    Past Medical History:  Diagnosis Date  . Anxiety   . Asthma   . COPD (chronic obstructive pulmonary disease) ([place])   . Depression     Patient Active Problem List   Diagnosis Date Noted  . CAD (coronary artery disease) 04/01/2020  . Pulmonary embolism ([place]) 04/01/2020  . Obesity hypoventilation syndrome ([place]) 04/01/2020  . Shortness of breath   . Acute pulmonary edema (HCC)   . Anemia 12/05/2019  . Acute on chronic diastolic CHF (congestive heart failure) ([place]) 12/04/2019  . Chronic respiratory failure with hypoxia and hypercapnia ([place]) 11/20/2019  . Respiratory failure with hypoxia ([place]) 11/04/2019  . Pressure injury of skin 10/22/2019  . Chronic diastolic CHF (congestive heart failure) ([place]) 10/22/2019  . Chronic back pain  10/22/2019  . Palliative care by specialist   . Goals of care, counseling/discussion   . DNR (do not resuscitate)   . Drug induced constipation   . Supplemental oxygen dependent   . Debility 09/13/2019  . Essential hypertension 07/27/2019  . Allergic rhinitis 03/14/2018  . Elevated MCV 01/31/2018  . Vitamin B12 deficiency 01/31/2018  . Acute on chronic respiratory failure with hypoxia ([place]) 01/31/2018  . Respiratory failure, acute-on-chronic ([place]) 12/31/2017  . COPD exacerbation ([place]) 09/18/2017  . Hyperlipidemia 08/23/2017  . Depression 08/23/2017  . Anxiety 08/23/2017  . Hypercalcemia 08/23/2017  . Thrombocytosis 08/23/2017  . COPD with acute exacerbation ([place]) 08/23/2017  . ADD (attention deficit disorder) 08/30/2012  . Bipolar disorder, unspecified ([place]) 08/30/2012  . COPD  GOLD ? III with increased Eos and reversibility ? ACOS ?  08/30/2012  . Tobacco abuse 08/30/2012    Past Surgical History:  Procedure Laterality Date  . ABDOMINAL AORTOGRAM N/A 03/24/2020   Procedure: ABDOMINAL AORTOGRAM;  Surgeon: Jettie Booze, MD;  Location: [facility];  Service: Cardiovascular;  Laterality: N/A;  . ABDOMINAL HYSTERECTOMY     partial   . RIGHT/LEFT HEART CATH AND CORONARY ANGIOGRAPHY N/A 03/24/2020   Procedure: RIGHT/LEFT HEART CATH AND CORONARY ANGIOGRAPHY;  Surgeon: Jettie Booze, MD;  Location: [facility];  Service: Cardiovascular;  Laterality: N/A;     OB History   No obstetric history on file.     Family History  Problem Relation Age of Onset  . Asthma Mother   . Lung cancer Father   . COPD Father     Social History  Tobacco Use  . Smoking status: Former Smoker    Packs/day: 1.00    Years: 46.00    Pack years: 46.00    Types: Cigarettes    Start date: 69    Quit date: 08/23/2017    Years since quitting: 2.6  . Smokeless tobacco: Never Used  Substance Use Topics  . Alcohol use: No  . Drug use: No    Home Medications Prior to  Admission medications   Medication Sig Start Date End Date Taking? Authorizing Provider  albuterol (PROAIR HFA) 108 (90 Base) MCG/ACT inhaler Inhale 2 puffs into the lungs every 6 (six) hours as needed for wheezing or shortness of breath. 07/25/19   Tanda Rockers, MD  albuterol (PROVENTIL) (2.5 MG/3ML) 0.083% nebulizer solution Take 3 mLs (2.5 mg total) by nebulization every 6 (six) hours as needed for wheezing or shortness of breath. 09/10/19   Tanda Rockers, MD  ALPRAZolam Duanne Moron) 0.25 MG tablet Take 0.25 mg by mouth 2 (two) times daily as needed for anxiety. 03/03/20   [provider]  apixaban (ELIQUIS) 5 MG TABS tablet Take 1 tablet (5 mg total) by mouth 2 (two) times daily. 12/12/19   Antonieta Pert, MD  aspirin EC 81 MG EC tablet Take 1 tablet (81 mg total) by mouth daily. Swallow whole. 04/02/20 07/01/20  Dessa Phi, DO  atorvastatin (LIPITOR) 80 MG tablet Take 1 tablet (80 mg total) by mouth daily. 04/01/20 06/30/20  Dessa Phi, DO  baclofen (LIORESAL) 10 MG tablet Take 10 mg by mouth 3 (three) times daily.  09/05/19   [provider]  Baclofen 5 MG TABS Take 2.5-5 mg by mouth 2 (two) times daily as needed for pain. 03/01/20   [provider]  barrier cream (NON-SPECIFIED) CREA Apply 1 application topically daily as needed (apply to rash). 11/10/19   Dana Allan I, MD  Cholecalciferol (VITAMIN D3) 1.25 MG (50000 UT) CAPS Take 1 capsule by mouth once a week. 02/10/20   [provider]  famotidine (PEPCID) 20 MG tablet One at bedtime Patient taking differently: Take 20 mg by mouth at bedtime. 02/23/20   Tanda Rockers, MD  FASENRA 30 MG/ML SOSY INJECT 1 SYRINGE UNDER THE SKIN EVERY 8 WEEKS. Patient taking differently: Inject into the skin every 8 (eight) weeks. 02/10/20   Tanda Rockers, MD  Fluticasone-Umeclidin-Vilant (TRELEGY ELLIPTA) 200-62.5-25 MCG/INH AEPB Inhale 1 puff into the lungs daily. 04/16/20   Martyn Ehrich, NP  Fluticasone-Umeclidin-Vilant  (TRELEGY ELLIPTA) 200-62.5-25 MCG/INH AEPB Inhale 1 puff into the lungs daily. 04/30/20   Martyn Ehrich, NP  furosemide (LASIX) 40 MG tablet Take 1 tablet (40 mg total) by mouth 2 (two) times daily. 04/01/20 06/30/20  Dessa Phi, DO  guaiFENesin (MUCINEX) 600 MG 12 hr tablet Take 1 tablet (600 mg total) by mouth 2 (two) times daily. Patient taking differently: Take 600 mg by mouth 2 (two) times daily as needed for cough. 09/12/19   Florencia Reasons, MD  Infant Care Products [facility]) OINT Apply 1 application topically daily as needed (rash).    [provider]  lamoTRIgine (LAMICTAL) 200 MG tablet Take 200 mg by mouth at bedtime.     [provider]  lidocaine (LIDODERM) 5 % Place 1 patch onto the skin daily. Remove & Discard patch within 12 hours or as directed by MD 09/12/19   Florencia Reasons, MD  loratadine (CLARITIN) 10 MG tablet Take 10 mg by mouth every evening.     [provider]  metoprolol succinate (TOPROL-XL) 25 MG 24 hr tablet Take 0.5 tablets (12.5 mg total) by mouth daily. 04/02/20 07/01/20  Dessa Phi, DO  montelukast (SINGULAIR) 10 MG tablet Take 1 tablet (10 mg total) by mouth at bedtime. 04/01/20 06/30/20  Dessa Phi, DO  pantoprazole (PROTONIX) 40 MG tablet Take 1 tablet (40 mg total) by mouth daily. Take 30-60 min before first meal of the day 07/25/19   Tanda Rockers, MD  potassium chloride (KLOR-CON) 10 MEQ tablet Take 1 tablet (10 mEq total) by mouth 2 (two) times daily. Take with lasix. 04/01/20 06/30/20  Dessa Phi, DO  predniSONE (DELTASONE) 10 MG tablet Take 30mg  daily alternating with 20mg  daily for 1 week, then taper down to 20mg  daily for 1 week; then stay on 10mg  daily 04/16/20   Martyn Ehrich, NP  QUEtiapine (SEROQUEL) 50 MG tablet Take 150 mg by mouth at bedtime. 03/04/20   [provider]  risperiDONE (RISPERDAL) 1 MG tablet Take 2 mg by mouth at bedtime.    [provider]  senna-docusate (SENOKOT-S) 8.6-50 MG tablet Take 1  tablet by mouth 2 (two) times daily. 09/12/19   Florencia Reasons, MD  venlafaxine XR (EFFEXOR-XR) 150 MG 24 hr capsule Take 150 mg by mouth at bedtime.     [provider]  vitamin B-12 (CYANOCOBALAMIN) 500 MCG tablet Take 1 tablet (500 mcg total) by mouth daily. 11/11/19   Bonnell Public, MD    Allergies    Sulfa antibiotics  Review of Systems   Review of Systems  Unable to perform ROS: Acuity of condition    Physical Exam Updated Vital Signs BP (!) 82/44   Pulse 96   Temp (!) 96.3 F (35.7 C)   Resp 19   SpO2 96%   Physical Exam Vitals and nursing note reviewed.  Constitutional:      Appearance: She is well-developed. She is obese. She is ill-appearing.     Comments: Patient unresponsive, King airway in place  HENT:     Head: Normocephalic and atraumatic.  Eyes:     Conjunctiva/sclera: Conjunctivae normal.     Comments: Pupils 2 mm does not track  Cardiovascular:     Rate and Rhythm: Normal rate and regular rhythm.  Pulmonary:     Comments: Ventilates without substantial resistance with bag valve mask via [facility] Pace airway Musculoskeletal:        General: No deformity.  Skin:    General: Skin is warm and dry.  Neurological:     Comments: Flaccid  Psychiatric:     Comments: Altered      ED Results / Procedures / Treatments   Labs (all labs ordered are listed, but only abnormal results are displayed) Labs Reviewed  LACTIC ACID, PLASMA - Abnormal; Notable for the following components:      Result Value   Lactic Acid, Venous 3.2 (*)    All other components within normal limits  CBC WITH DIFFERENTIAL/PLATELET - Abnormal; Notable for the following components:   WBC 11.4 (*)    RBC 3.33 (*)    Hemoglobin 10.6 (*)    MCV 108.4 (*)    MCHC 29.4 (*)    Abs Immature Granulocytes 0.50 (*)    All other components within normal limits  I-STAT CHEM 8, ED - Abnormal; Notable for the following components:   Chloride 94 (*)    BUN 35 (*)    Glucose, Bld 200 (*)     TCO2 34 (*)  Hemoglobin 11.6 (*)    HCT 34.0 (*)    All other components within normal limits  COMPREHENSIVE METABOLIC PANEL  LIPASE, BLOOD  LACTIC ACID, PLASMA  BRAIN NATRIURETIC PEPTIDE  TROPONIN I (HIGH SENSITIVITY)    EKG None  Radiology DG Chest Port 1 View  Result Date: 05-15-20 CLINICAL DATA:  Status post CPR, check endotracheal tube placement EXAM: PORTABLE CHEST 1 VIEW COMPARISON:  04/01/2018 FINDINGS: Endotracheal tube is noted 3 cm above the carina. Gastric catheter extends into the stomach. Cardiac shadow is prominent but accentuated by the portable technique. Diffuse vascular congestion is noted accentuated by the poor inspiratory effort. No focal confluent infiltrate is seen. IMPRESSION: Tubes and lines as described. Vascular congestion accentuated by poor inspiratory effort. Electronically Signed   By: Inez Catalina M.D.   On: 15-May-2020 12:57    Procedures Procedures   Medications Ordered in ED Medications  sodium chloride 0.9 % bolus 1,000 mL (has no administration in time range)  fentaNYL (SUBLIMAZE) injection 50 mcg (has no administration in time range)  fentaNYL (SUBLIMAZE) injection 50 mcg (50 mcg Intravenous Given 05-15-2020 1219)  midazolam (VERSED) injection 1 mg (has no administration in time range)  midazolam (VERSED) injection 1 mg (1 mg Intravenous Given 05-15-2020 1219)  norepinephrine (LEVOPHED) 4mg  in 23mL premix infusion (15 mcg/min Intravenous Rate/Dose Change 15-May-2020 1255)    ED Course  I have reviewed the triage vital signs and the nursing notes.  Pertinent labs & imaging results that were available during my care of the patient were reviewed by me and considered in my medical decision making (see chart for details).   On arrival patient was transferred to our monitoring equipment, continuous cardiac and pulse oximetry. Patient mildly hypotensive, though with spontaneous heart rate in the 90s, sinus, unremarkable.  Patient had intermittent  spontaneous breathing, though largely was dependent on bag-valve-mask via [facility] airway. After discussing of goals of care with the son, the patient had intubation, performed a complication.  INTUBATION Performed by: Carmin Muskrat  Required items: required blood products, implants, devices, and special equipment available Patient identity confirmed: provided demographic data and hospital-assigned identification number Time out: Immediately prior to procedure a "time out" was called to verify the correct patient, procedure, equipment, support staff and site/side marked as required.  Indications: airway protection  Intubation method: Glidescope Laryngoscopy   Preoxygenation: BVM    Tube Size: 7.5 cuffed  Post-procedure assessment: chest rise and ETCO2 monitor Breath sounds: equal and absent over the epigastrium Tube secured with: ETT holder Chest x-ray interpreted by radiologist and me.  Chest x-ray findings: endotracheal tube in appropriate position  Patient tolerated the procedure well with no immediate complications.  Patient tolerated intubation without complication, subsequently had unremarkable ventilation with mechanical assistance.  1:19 PM I discussed the patient's case with our critical care colleagues.  Patient's son is now at bedside.   Initial labs notable for lactic acidosis.  Patient awaiting CT scan, patient will require ICU admission.  2:02 PM At bedside discussed the patient's presentation with her son, and with our critical care colleagues. Our CCU team is familiar with the patient from pulmonology clinic, notes that she has been battling respiratory difficulty for some time. Son indicates the patient would not want mechanical life support as she is currently receiving, now after CPR, mechanical ventilation. After consultation, the patient will have withdrawal of care. MDM Rules/Calculators/A&P MDM Number of Diagnoses or Management Options Respiratory  arrest [facility]): new, needed workup   Amount and/or Complexity  of Data Reviewed Clinical lab tests: ordered and reviewed Tests in the radiology section of CPT: ordered and reviewed Tests in the medicine section of CPT: ordered and reviewed Decide to obtain previous medical records or to obtain history from someone other than the patient: yes Obtain history from someone other than the patient: yes Review and summarize past medical records: yes Discuss the patient with other providers: yes Independent visualization of images, tracings, or specimens: yes  Risk of Complications, Morbidity, and/or Mortality Presenting problems: high Diagnostic procedures: high Management options: high  Critical Care Total time providing critical care: 30-74 minutes (40)  Patient Progress Patient progress: stable  Final Clinical Impression(s) / ED Diagnoses Final diagnoses:  Respiratory arrest ([place])     Carmin Muskrat, MD 2020/04/26 1405

## 2020-05-09 NOTE — ED Notes (Signed)
Time of death called at 1451 with Lytle Butte RN and Ophelia Charter RN

## 2020-05-09 NOTE — ED Triage Notes (Signed)
Patient is a post arrest, witnessed with CPR started on the scene.  Patient had a King airway in place on arrival.  Patient given 1 Epi on scene with EMS.  Has a DNR, son is looking for the paperwork.

## 2020-05-09 NOTE — Progress Notes (Signed)
   05/17/20 1302  Clinical Encounter Type  Visited With Patient and family together  Visit Type Patient actively dying;Death  Referral From Nurse  Consult/Referral To Chaplain  Spiritual Encounters  Spiritual Needs Grief support;Emotional;Prayer  Stress Factors  Family Stress Factors Major life changes;Loss  Chaplain received a call from ED Bridge that Ms. Croucher was coming to end of life and her son Inocente Salles was at bedside and could use support.  He told me his mother was his best friend and everything seemed so unreal. He had an opportunity to reflect on her favorite song and precious moments with his mother.  He was ready for her to be at peace.  Kathlee Nations from  respiratory played her favorite artist Toula Moos and it was a peaceful transitioning.  At this time no arrangements have been made but [name] will call Patient Placement and inform them which funeral home he chooses.   Offered Support, comfort, prayer and compassion.  Chaplain Mikylah Ackroyd Morgan-Simpson  [phone]

## 2020-05-09 NOTE — Progress Notes (Signed)
Julia Daniels has severe, difficult to control COPD asthma overlap with recent hospitalization. During that admission she was DNR and had been seen by palliative care to help ensure we were addressing symptoms adequately. She had severe sudden onset SOB today and was cyanotic and unresponsive by the time EMS arrived. At some point she had a cardiac arrest and CPR was started. She was intubated in the ED. She has previously been DNR in past admissions and her son at bedside says she would not want this and was very clear about this. She had a DNR bracelet on according to him. He would like to terminally withdraw today. No NMB given when intubated. Withdraw of life support orders placed and discussed with EDP & RN. Chaplain at bedside.   Julian Hy, DO 05-14-2020 1:49 PM [facility]

## 2020-05-09 NOTE — Procedures (Signed)
Extubation Procedure Note  Patient Details:   Name: Julia Daniels DOB: 1950-02-17 MRN: [id number]   Airway Documentation:    Vent end date: 04-23-2020 Vent end time: 1430   Evaluation  exubated to RA per Withdrawal of Life protocol   [name], [name] 04/23/20, 2:43 PM

## 2020-05-09 DEATH — deceased

## 2020-05-20 ENCOUNTER — Ambulatory Visit: Payer: Self-pay | Admitting: Internal Medicine

## 2020-07-08 ENCOUNTER — Ambulatory Visit: Payer: Self-pay | Admitting: Pulmonary Disease

## 2020-09-16 DIAGNOSIS — Z9181 History of falling: Secondary | ICD-10-CM | POA: Diagnosis not present

## 2020-09-16 DIAGNOSIS — R52 Pain, unspecified: Secondary | ICD-10-CM | POA: Diagnosis not present

## 2020-09-16 DIAGNOSIS — R410 Disorientation, unspecified: Secondary | ICD-10-CM | POA: Diagnosis not present

## 2020-09-16 DIAGNOSIS — W19XXXA Unspecified fall, initial encounter: Secondary | ICD-10-CM | POA: Diagnosis not present

## 2020-11-28 IMAGING — DX DG LUMBAR SPINE 2-3V
3 series · 3 of 3 positions shown · non-contrast
Comparison: CT lumbar spine dated September 08, 2019.

CLINICAL DATA: Worsening low back pain.

EXAM:
LUMBAR SPINE - 2-3 VIEW

[l-spine ap]
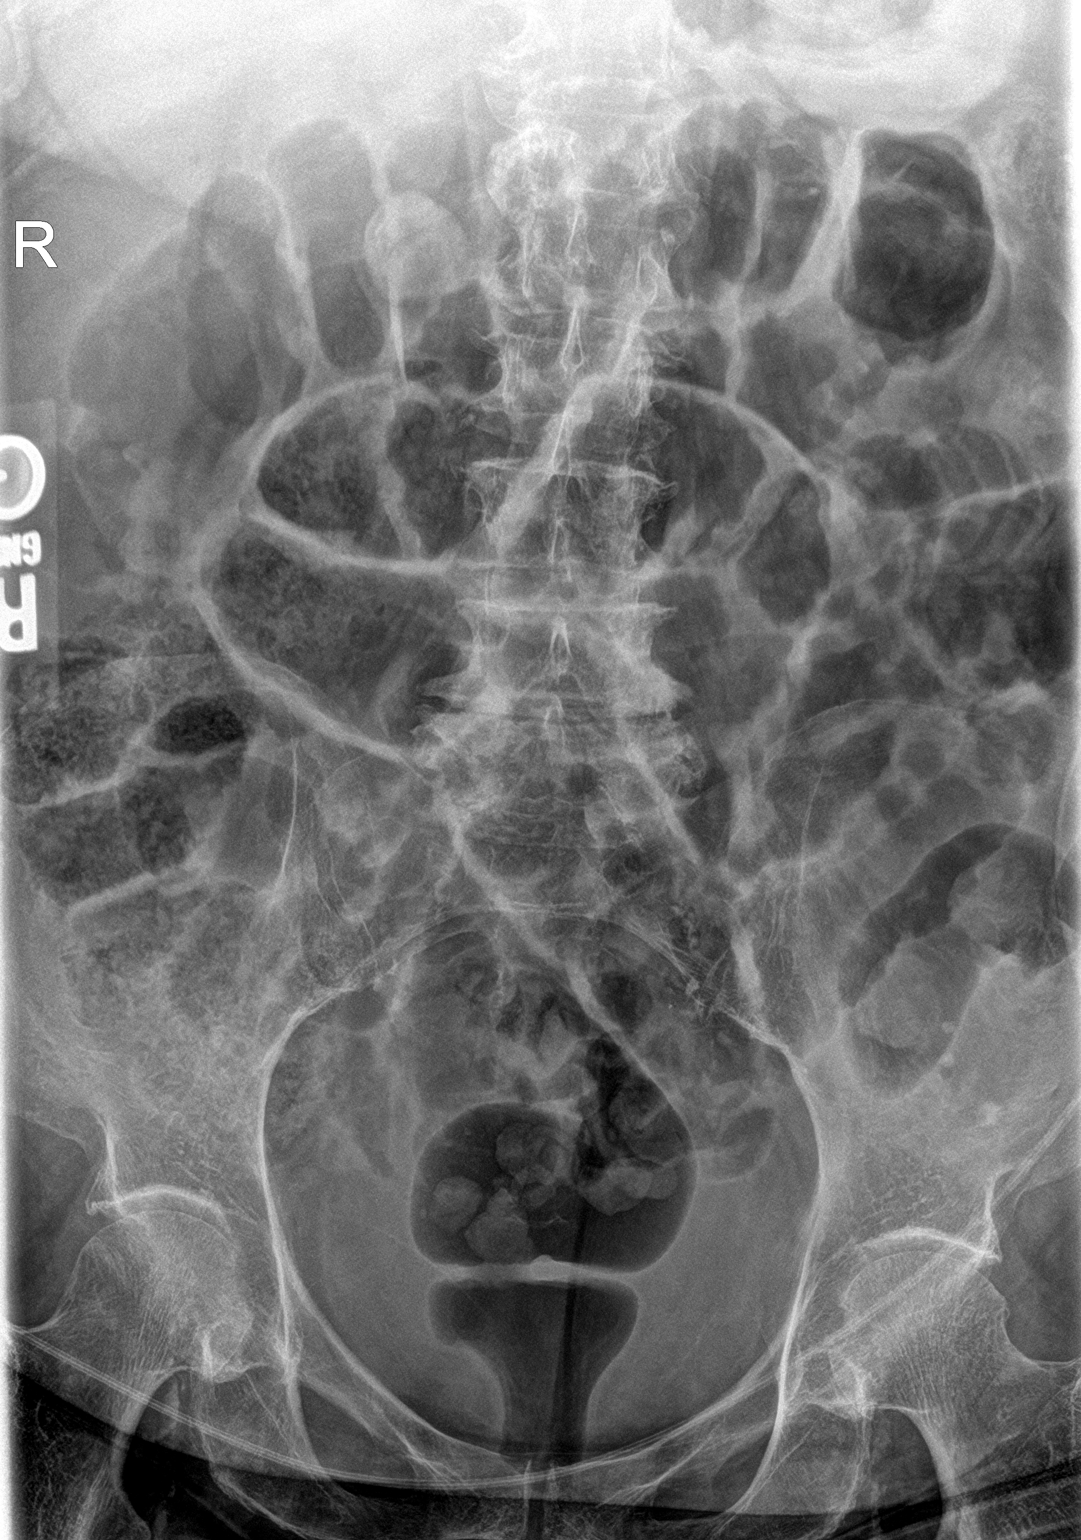

[l-spine lat]
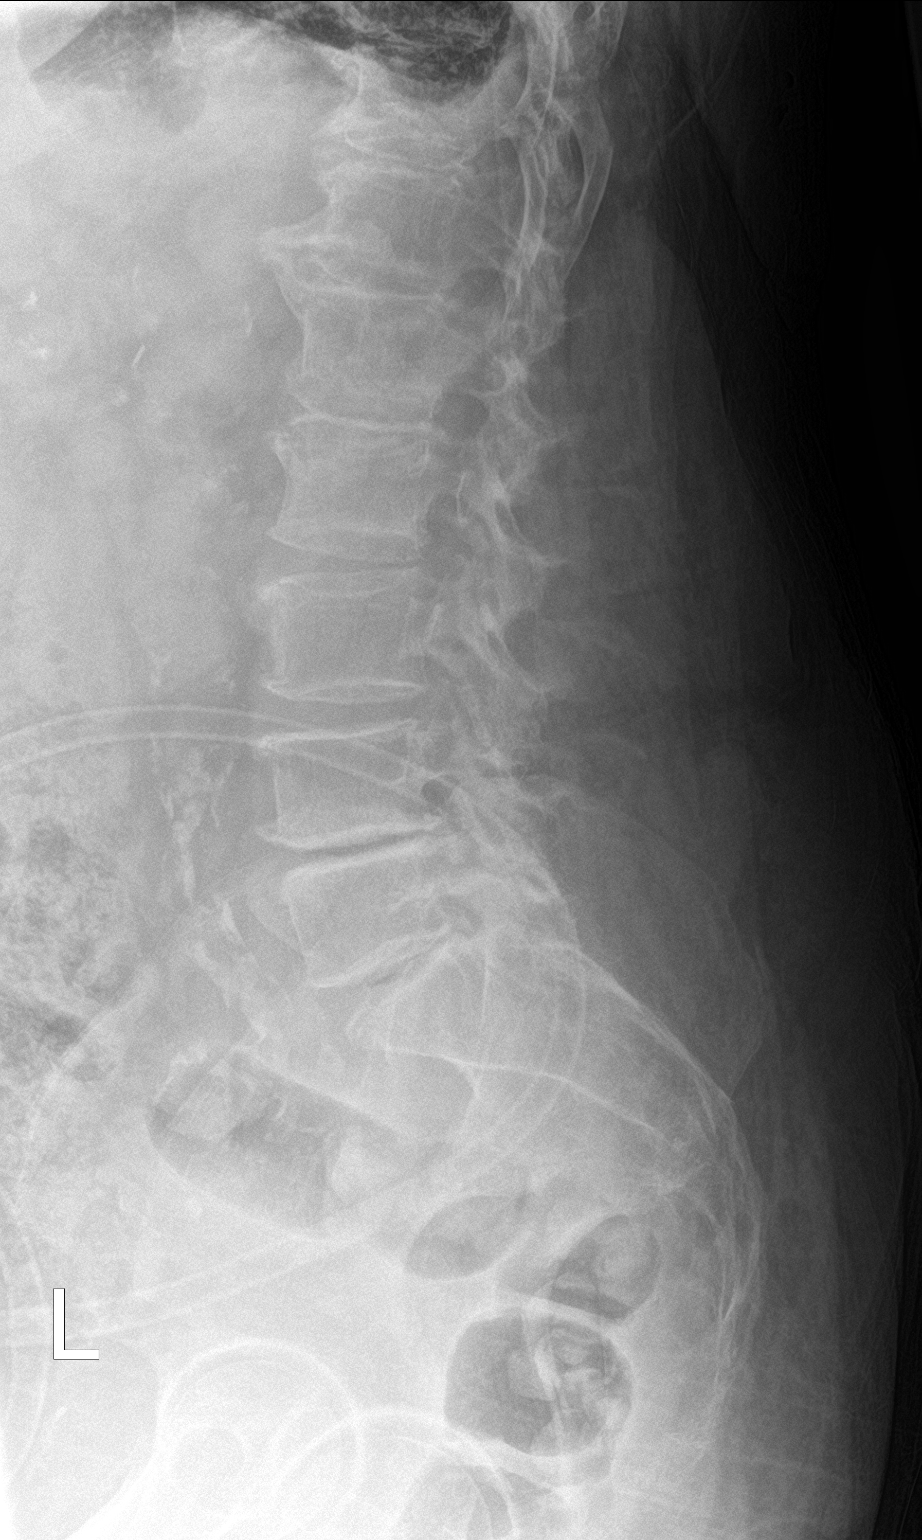

[l-spine spot]
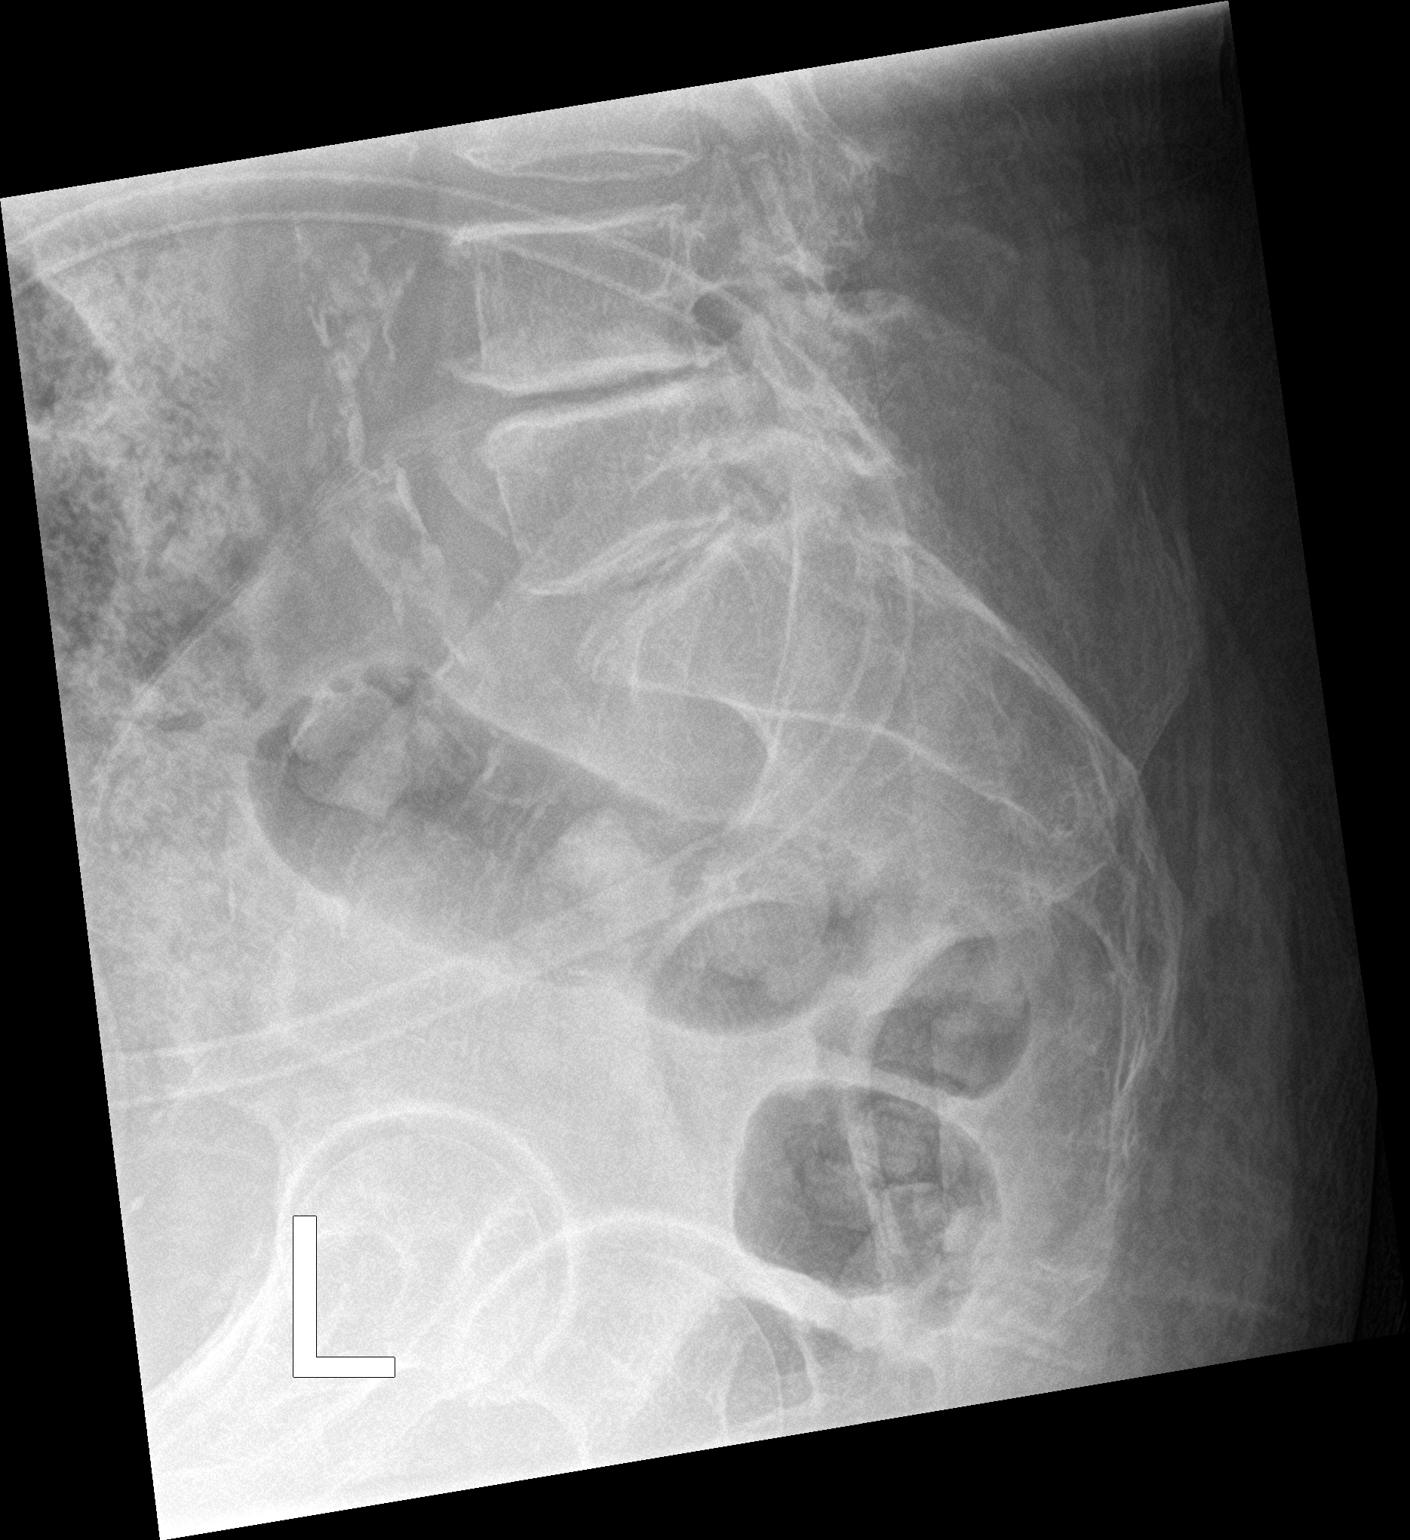

[3 of 3 positions shown; findings below may reference images not displayed]

FINDINGS: Five lumbar type vertebral bodies. No acute fracture or subluxation.
Vertebral body heights are preserved.

Alignment is normal. Unchanged moderate to severe disc height loss
at L4-L5 and mild disc height loss at L1-L2 and L5-S1. Unchanged
advanced lower lumbar facet arthropathy.

Mildly dilated loops of small bowel in the central abdomen. Gas and
stool throughout the colon.
IMPRESSION: 1.  No acute osseous abnormality.
2. Unchanged multilevel lumbar spondylosis as described above,
moderate to severe at L4-L5.
3. Mildly dilated loops of small bowel in the central abdomen could
reflect ileus.

## 2021-08-24 NOTE — Telephone Encounter (Signed)
Closing encounter
